# Patient Record
Sex: Female | Born: 1960 | Race: Black or African American | Hispanic: No | Marital: Single | Smoking: Current every day smoker
Health system: Southern US, Community
[De-identification: ages and names within clinical notes are randomized; demographics above are authoritative.]

## PROBLEM LIST (undated history)

## (undated) ENCOUNTER — Emergency Department (HOSPITAL_COMMUNITY): Admission: EM | Payer: Self-pay

## (undated) DIAGNOSIS — I10 Essential (primary) hypertension: Secondary | ICD-10-CM

## (undated) DIAGNOSIS — F1910 Other psychoactive substance abuse, uncomplicated: Secondary | ICD-10-CM

## (undated) DIAGNOSIS — F319 Bipolar disorder, unspecified: Secondary | ICD-10-CM

## (undated) DIAGNOSIS — F329 Major depressive disorder, single episode, unspecified: Secondary | ICD-10-CM

## (undated) DIAGNOSIS — I517 Cardiomegaly: Secondary | ICD-10-CM

## (undated) DIAGNOSIS — F99 Mental disorder, not otherwise specified: Secondary | ICD-10-CM

## (undated) DIAGNOSIS — F32A Depression, unspecified: Secondary | ICD-10-CM

## (undated) DIAGNOSIS — D219 Benign neoplasm of connective and other soft tissue, unspecified: Secondary | ICD-10-CM

## (undated) HISTORY — PX: EXPLORATORY LAPAROTOMY: SUR591

## (undated) HISTORY — PX: DILATION AND CURETTAGE OF UTERUS: SHX78

## (undated) HISTORY — PX: BILATERAL SALPINGOOPHORECTOMY: SHX1223

## (undated) HISTORY — DX: Cardiomegaly: I51.7

## (undated) HISTORY — DX: Other psychoactive substance abuse, uncomplicated: F19.10

## (undated) HISTORY — DX: Benign neoplasm of connective and other soft tissue, unspecified: D21.9

## (undated) HISTORY — PX: ABDOMINAL HYSTERECTOMY: SHX81

## (undated) HISTORY — PX: TONSILLECTOMY: SUR1361

---

## 2001-03-20 ENCOUNTER — Emergency Department (HOSPITAL_COMMUNITY): Admission: EM | Admit: 2001-03-20 | Discharge: 2001-03-20 | Payer: Self-pay | Admitting: Emergency Medicine

## 2001-03-22 ENCOUNTER — Encounter: Admission: RE | Admit: 2001-03-22 | Discharge: 2001-03-22 | Payer: Self-pay | Admitting: Internal Medicine

## 2001-03-24 ENCOUNTER — Encounter: Payer: Self-pay | Admitting: Internal Medicine

## 2001-03-24 ENCOUNTER — Encounter: Admission: RE | Admit: 2001-03-24 | Discharge: 2001-03-24 | Payer: Self-pay | Admitting: Internal Medicine

## 2002-02-15 ENCOUNTER — Emergency Department (HOSPITAL_COMMUNITY): Admission: EM | Admit: 2002-02-15 | Discharge: 2002-02-15 | Payer: Self-pay | Admitting: *Deleted

## 2002-02-18 ENCOUNTER — Emergency Department (HOSPITAL_COMMUNITY): Admission: EM | Admit: 2002-02-18 | Discharge: 2002-02-18 | Payer: Self-pay | Admitting: Emergency Medicine

## 2002-02-28 ENCOUNTER — Encounter: Admission: RE | Admit: 2002-02-28 | Discharge: 2002-02-28 | Payer: Self-pay | Admitting: Internal Medicine

## 2002-03-08 ENCOUNTER — Encounter: Admission: RE | Admit: 2002-03-08 | Discharge: 2002-03-08 | Payer: Self-pay | Admitting: *Deleted

## 2002-03-22 ENCOUNTER — Ambulatory Visit (HOSPITAL_COMMUNITY): Admission: RE | Admit: 2002-03-22 | Discharge: 2002-03-22 | Payer: Self-pay | Admitting: *Deleted

## 2002-04-01 ENCOUNTER — Encounter: Admission: RE | Admit: 2002-04-01 | Discharge: 2002-04-01 | Payer: Self-pay | Admitting: *Deleted

## 2002-04-05 ENCOUNTER — Other Ambulatory Visit: Admission: RE | Admit: 2002-04-05 | Discharge: 2002-04-05 | Payer: Self-pay | Admitting: *Deleted

## 2002-04-05 ENCOUNTER — Encounter: Admission: RE | Admit: 2002-04-05 | Discharge: 2002-04-05 | Payer: Self-pay | Admitting: *Deleted

## 2002-04-05 ENCOUNTER — Encounter (INDEPENDENT_AMBULATORY_CARE_PROVIDER_SITE_OTHER): Payer: Self-pay | Admitting: Specialist

## 2002-04-13 ENCOUNTER — Inpatient Hospital Stay (HOSPITAL_COMMUNITY): Admission: RE | Admit: 2002-04-13 | Discharge: 2002-04-16 | Payer: Self-pay | Admitting: *Deleted

## 2002-04-13 ENCOUNTER — Encounter (INDEPENDENT_AMBULATORY_CARE_PROVIDER_SITE_OTHER): Payer: Self-pay

## 2002-10-27 ENCOUNTER — Encounter: Admission: RE | Admit: 2002-10-27 | Discharge: 2002-10-27 | Payer: Self-pay | Admitting: Obstetrics and Gynecology

## 2003-01-10 ENCOUNTER — Emergency Department (HOSPITAL_COMMUNITY): Admission: EM | Admit: 2003-01-10 | Discharge: 2003-01-10 | Payer: Self-pay

## 2003-09-12 ENCOUNTER — Emergency Department (HOSPITAL_COMMUNITY): Admission: EM | Admit: 2003-09-12 | Discharge: 2003-09-12 | Payer: Self-pay | Admitting: Family Medicine

## 2005-03-08 ENCOUNTER — Emergency Department (HOSPITAL_COMMUNITY): Admission: AC | Admit: 2005-03-08 | Discharge: 2005-03-08 | Payer: Self-pay

## 2005-07-20 ENCOUNTER — Emergency Department (HOSPITAL_COMMUNITY): Admission: EM | Admit: 2005-07-20 | Discharge: 2005-07-20 | Payer: Self-pay | Admitting: Emergency Medicine

## 2005-07-23 ENCOUNTER — Emergency Department (HOSPITAL_COMMUNITY): Admission: EM | Admit: 2005-07-23 | Discharge: 2005-07-23 | Payer: Self-pay | Admitting: Emergency Medicine

## 2006-03-24 ENCOUNTER — Emergency Department (HOSPITAL_COMMUNITY): Admission: EM | Admit: 2006-03-24 | Discharge: 2006-03-24 | Payer: Self-pay | Admitting: Emergency Medicine

## 2006-03-27 ENCOUNTER — Ambulatory Visit: Payer: Self-pay | Admitting: *Deleted

## 2006-11-06 ENCOUNTER — Inpatient Hospital Stay (HOSPITAL_COMMUNITY): Admission: EM | Admit: 2006-11-06 | Discharge: 2006-11-07 | Payer: Self-pay | Admitting: Emergency Medicine

## 2006-11-06 ENCOUNTER — Ambulatory Visit: Payer: Self-pay | Admitting: Cardiology

## 2006-11-06 ENCOUNTER — Encounter: Payer: Self-pay | Admitting: Internal Medicine

## 2006-11-06 ENCOUNTER — Encounter: Payer: Self-pay | Admitting: Cardiology

## 2007-02-18 ENCOUNTER — Encounter: Payer: Self-pay | Admitting: Internal Medicine

## 2007-11-10 ENCOUNTER — Emergency Department (HOSPITAL_COMMUNITY): Admission: EM | Admit: 2007-11-10 | Discharge: 2007-11-10 | Payer: Self-pay | Admitting: Emergency Medicine

## 2008-07-04 ENCOUNTER — Emergency Department (HOSPITAL_COMMUNITY): Admission: EM | Admit: 2008-07-04 | Discharge: 2008-07-04 | Payer: Self-pay | Admitting: Emergency Medicine

## 2008-08-15 ENCOUNTER — Ambulatory Visit (HOSPITAL_COMMUNITY): Admission: RE | Admit: 2008-08-15 | Discharge: 2008-08-15 | Payer: Self-pay | Admitting: Obstetrics & Gynecology

## 2008-09-17 ENCOUNTER — Emergency Department (HOSPITAL_COMMUNITY): Admission: EM | Admit: 2008-09-17 | Discharge: 2008-09-17 | Payer: Self-pay | Admitting: Emergency Medicine

## 2008-10-12 ENCOUNTER — Emergency Department (HOSPITAL_COMMUNITY): Admission: EM | Admit: 2008-10-12 | Discharge: 2008-10-12 | Payer: Self-pay | Admitting: Emergency Medicine

## 2008-11-28 ENCOUNTER — Emergency Department (HOSPITAL_COMMUNITY): Admission: EM | Admit: 2008-11-28 | Discharge: 2008-11-28 | Payer: Self-pay | Admitting: Emergency Medicine

## 2008-11-30 ENCOUNTER — Ambulatory Visit: Payer: Self-pay | Admitting: Obstetrics and Gynecology

## 2008-11-30 ENCOUNTER — Ambulatory Visit (HOSPITAL_COMMUNITY): Admission: RE | Admit: 2008-11-30 | Discharge: 2008-11-30 | Payer: Self-pay | Admitting: Obstetrics & Gynecology

## 2009-01-01 ENCOUNTER — Encounter: Admission: RE | Admit: 2009-01-01 | Discharge: 2009-01-01 | Payer: Self-pay | Admitting: Internal Medicine

## 2009-02-06 ENCOUNTER — Ambulatory Visit: Payer: Self-pay | Admitting: Internal Medicine

## 2009-02-06 ENCOUNTER — Encounter: Payer: Self-pay | Admitting: Licensed Clinical Social Worker

## 2009-02-06 ENCOUNTER — Encounter: Payer: Self-pay | Admitting: Internal Medicine

## 2009-02-06 ENCOUNTER — Ambulatory Visit (HOSPITAL_COMMUNITY): Admission: RE | Admit: 2009-02-06 | Discharge: 2009-02-06 | Payer: Self-pay | Admitting: Internal Medicine

## 2009-02-06 DIAGNOSIS — D1720 Benign lipomatous neoplasm of skin and subcutaneous tissue of unspecified limb: Secondary | ICD-10-CM

## 2009-02-06 DIAGNOSIS — M25519 Pain in unspecified shoulder: Secondary | ICD-10-CM

## 2009-02-06 DIAGNOSIS — G4700 Insomnia, unspecified: Secondary | ICD-10-CM

## 2009-02-06 DIAGNOSIS — D259 Leiomyoma of uterus, unspecified: Secondary | ICD-10-CM | POA: Insufficient documentation

## 2009-02-06 DIAGNOSIS — F1910 Other psychoactive substance abuse, uncomplicated: Secondary | ICD-10-CM | POA: Insufficient documentation

## 2009-02-06 DIAGNOSIS — R1900 Intra-abdominal and pelvic swelling, mass and lump, unspecified site: Secondary | ICD-10-CM

## 2009-02-06 DIAGNOSIS — F1010 Alcohol abuse, uncomplicated: Secondary | ICD-10-CM | POA: Insufficient documentation

## 2009-02-06 DIAGNOSIS — I10 Essential (primary) hypertension: Secondary | ICD-10-CM | POA: Insufficient documentation

## 2009-02-06 LAB — CONVERTED CEMR LAB
ALT: 18 units/L (ref 0–35)
BUN: 11 mg/dL (ref 6–23)
CO2: 22 meq/L (ref 19–32)
Cholesterol: 177 mg/dL (ref 0–200)
Creatinine, Ser: 0.89 mg/dL (ref 0.40–1.20)
Eosinophils Absolute: 0.1 10*3/uL (ref 0.0–0.7)
Eosinophils Relative: 2 % (ref 0–5)
Glucose, Bld: 72 mg/dL (ref 70–99)
HCT: 43 % (ref 36.0–46.0)
HDL: 45 mg/dL (ref >39)
Hgb A1c MFr Bld: 5.8 %
Lymphocytes Relative: 34 % (ref 12–46)
Lymphs Abs: 2 10*3/uL (ref 0.7–4.0)
MCV: 88.9 fL (ref 78.0–100.0)
Monocytes Relative: 6 % (ref 3–12)
RBC: 4.84 M/uL (ref 3.87–5.11)
Total Bilirubin: 0.3 mg/dL (ref 0.3–1.2)
Total CHOL/HDL Ratio: 3.9
Triglycerides: 165 mg/dL — ABNORMAL HIGH (ref <150)
VLDL: 33 mg/dL (ref 0–40)
WBC: 5.9 10*3/uL (ref 4.0–10.5)

## 2009-02-08 ENCOUNTER — Emergency Department (HOSPITAL_COMMUNITY): Admission: EM | Admit: 2009-02-08 | Discharge: 2009-02-08 | Payer: Self-pay | Admitting: Emergency Medicine

## 2009-02-15 ENCOUNTER — Encounter: Admission: RE | Admit: 2009-02-15 | Discharge: 2009-02-15 | Payer: Self-pay | Admitting: Infectious Diseases

## 2009-08-16 ENCOUNTER — Ambulatory Visit: Payer: Self-pay | Admitting: Family Medicine

## 2009-08-17 ENCOUNTER — Ambulatory Visit (HOSPITAL_COMMUNITY): Admission: RE | Admit: 2009-08-17 | Discharge: 2009-08-17 | Payer: Self-pay | Admitting: Obstetrics & Gynecology

## 2009-08-17 ENCOUNTER — Encounter: Payer: Self-pay | Admitting: Family Medicine

## 2009-08-17 LAB — CONVERTED CEMR LAB
Hemoglobin: 14.7 g/dL (ref 12.0–15.0)
MCHC: 33 g/dL (ref 30.0–36.0)
MCV: 88 fL (ref 78.0–100.0)
RBC: 5.07 M/uL (ref 3.87–5.11)
WBC: 5.7 10*3/uL (ref 4.0–10.5)

## 2009-10-04 ENCOUNTER — Emergency Department (HOSPITAL_COMMUNITY): Admission: EM | Admit: 2009-10-04 | Discharge: 2009-10-04 | Payer: Self-pay | Admitting: Emergency Medicine

## 2009-12-06 ENCOUNTER — Encounter: Payer: Self-pay | Admitting: Licensed Clinical Social Worker

## 2009-12-06 ENCOUNTER — Ambulatory Visit: Payer: Self-pay | Admitting: Internal Medicine

## 2009-12-06 DIAGNOSIS — K029 Dental caries, unspecified: Secondary | ICD-10-CM

## 2009-12-06 DIAGNOSIS — Z9189 Other specified personal risk factors, not elsewhere classified: Secondary | ICD-10-CM

## 2009-12-06 DIAGNOSIS — R109 Unspecified abdominal pain: Secondary | ICD-10-CM

## 2009-12-06 LAB — CONVERTED CEMR LAB
ALT: 18 units/L (ref 0–35)
Amphetamine Screen, Ur: NEGATIVE
BUN: 12 mg/dL (ref 6–23)
Barbiturate Quant, Ur: NEGATIVE
Basophils Relative: 0 % (ref 0–1)
Benzodiazepines.: NEGATIVE
CO2: 24 meq/L (ref 19–32)
Calcium: 9.9 mg/dL (ref 8.4–10.5)
Chloride: 106 meq/L (ref 96–112)
Cocaine Metabolites: POSITIVE — AB
Creatinine, Ser: 0.93 mg/dL (ref 0.40–1.20)
Creatinine,U: 111.8 mg/dL
Eosinophils Absolute: 0.1 10*3/uL (ref 0.0–0.7)
Eosinophils Relative: 2 % (ref 0–5)
Glucose, Bld: 85 mg/dL (ref 70–99)
HCT: 44.4 % (ref 36.0–46.0)
Hemoglobin, Urine: NEGATIVE
Leukocytes, UA: NEGATIVE
Lymphs Abs: 1.8 10*3/uL (ref 0.7–4.0)
MCHC: 33.1 g/dL (ref 30.0–36.0)
MCV: 88.6 fL (ref >=78.0)
Monocytes Relative: 7 % (ref 3–12)
Neutrophils Relative %: 58 % (ref 43–77)
Nitrite: NEGATIVE
Phencyclidine (PCP): NEGATIVE
RBC: 5.01 M/uL (ref 3.87–5.11)
Specific Gravity, Urine: 1.017 (ref 1.005–1.0)
Total Bilirubin: 0.4 mg/dL (ref 0.3–1.2)
Urine Glucose: NEGATIVE mg/dL
pH: 5.5 (ref 5.0–8.0)

## 2009-12-11 ENCOUNTER — Telehealth: Payer: Self-pay | Admitting: Licensed Clinical Social Worker

## 2009-12-15 ENCOUNTER — Inpatient Hospital Stay (HOSPITAL_COMMUNITY): Admission: AD | Admit: 2009-12-15 | Discharge: 2009-12-18 | Payer: Self-pay | Admitting: Obstetrics & Gynecology

## 2009-12-15 ENCOUNTER — Ambulatory Visit: Payer: Self-pay | Admitting: Obstetrics & Gynecology

## 2009-12-15 ENCOUNTER — Encounter: Payer: Self-pay | Admitting: Emergency Medicine

## 2009-12-18 ENCOUNTER — Encounter: Payer: Self-pay | Admitting: Obstetrics & Gynecology

## 2009-12-20 ENCOUNTER — Ambulatory Visit: Payer: Self-pay | Admitting: Obstetrics & Gynecology

## 2010-01-01 ENCOUNTER — Inpatient Hospital Stay (HOSPITAL_COMMUNITY): Admission: RE | Admit: 2010-01-01 | Discharge: 2010-01-03 | Payer: Self-pay | Admitting: Obstetrics & Gynecology

## 2010-01-01 ENCOUNTER — Encounter: Payer: Self-pay | Admitting: Obstetrics & Gynecology

## 2010-01-01 ENCOUNTER — Ambulatory Visit: Payer: Self-pay | Admitting: Obstetrics & Gynecology

## 2010-01-09 ENCOUNTER — Encounter: Payer: Self-pay | Admitting: Licensed Clinical Social Worker

## 2010-01-10 ENCOUNTER — Ambulatory Visit: Payer: Self-pay | Admitting: Obstetrics and Gynecology

## 2010-02-08 ENCOUNTER — Telehealth: Payer: Self-pay | Admitting: Licensed Clinical Social Worker

## 2010-02-14 ENCOUNTER — Ambulatory Visit: Payer: Self-pay | Admitting: Obstetrics and Gynecology

## 2010-02-15 ENCOUNTER — Telehealth: Payer: Self-pay | Admitting: Internal Medicine

## 2010-04-06 ENCOUNTER — Emergency Department (HOSPITAL_COMMUNITY): Admission: EM | Admit: 2010-04-06 | Discharge: 2010-04-06 | Payer: Self-pay | Admitting: Emergency Medicine

## 2010-05-03 ENCOUNTER — Ambulatory Visit: Payer: Self-pay | Admitting: Internal Medicine

## 2010-05-03 ENCOUNTER — Ambulatory Visit (HOSPITAL_COMMUNITY): Admission: RE | Admit: 2010-05-03 | Discharge: 2010-05-03 | Payer: Self-pay | Admitting: Family Medicine

## 2010-05-03 ENCOUNTER — Encounter: Payer: Self-pay | Admitting: Internal Medicine

## 2010-05-03 DIAGNOSIS — M7130 Other bursal cyst, unspecified site: Secondary | ICD-10-CM | POA: Insufficient documentation

## 2010-05-03 LAB — CONVERTED CEMR LAB
Eosinophils Relative: 1 % (ref 0–5)
HCT: 45.8 % (ref 36.0–46.0)
Lymphocytes Relative: 47 % — ABNORMAL HIGH (ref 12–46)
Lymphs Abs: 2.2 10*3/uL (ref 0.7–4.0)
MCV: 86.4 fL (ref >=78.0)
Monocytes Relative: 7 % (ref 3–12)
Neutrophils Relative %: 45 % (ref 43–77)
Platelets: 237 10*3/uL (ref 150–400)
RBC: 5.3 M/uL — ABNORMAL HIGH (ref 3.87–5.11)
Sed Rate: 12 mm/hr (ref 0–22)
TSH: 2.415 microintl units/mL (ref 0.350–4.5)
WBC: 4.7 10*3/uL (ref 4.0–10.5)

## 2010-05-03 LAB — HM MAMMOGRAPHY

## 2010-05-06 ENCOUNTER — Encounter: Payer: Self-pay | Admitting: Licensed Clinical Social Worker

## 2010-05-06 ENCOUNTER — Encounter: Payer: Self-pay | Admitting: Internal Medicine

## 2010-05-06 ENCOUNTER — Ambulatory Visit: Payer: Self-pay | Admitting: Internal Medicine

## 2010-05-06 DIAGNOSIS — L723 Sebaceous cyst: Secondary | ICD-10-CM | POA: Insufficient documentation

## 2010-05-07 ENCOUNTER — Encounter: Payer: Self-pay | Admitting: Licensed Clinical Social Worker

## 2010-06-27 ENCOUNTER — Emergency Department (HOSPITAL_COMMUNITY): Admission: EM | Admit: 2010-06-27 | Discharge: 2010-06-27 | Payer: Self-pay | Admitting: Emergency Medicine

## 2010-07-08 ENCOUNTER — Encounter: Payer: Self-pay | Admitting: Licensed Clinical Social Worker

## 2010-07-08 ENCOUNTER — Ambulatory Visit: Payer: Self-pay | Admitting: Internal Medicine

## 2010-07-12 ENCOUNTER — Ambulatory Visit: Payer: Self-pay | Admitting: Internal Medicine

## 2010-07-12 ENCOUNTER — Telehealth: Payer: Self-pay | Admitting: Internal Medicine

## 2010-07-12 LAB — CONVERTED CEMR LAB
Chloride: 106 meq/L (ref 96–112)
Potassium: 4.1 meq/L (ref 3.5–5.3)
Sodium: 141 meq/L (ref 135–145)

## 2010-07-26 ENCOUNTER — Ambulatory Visit: Payer: Self-pay | Admitting: Internal Medicine

## 2010-08-13 ENCOUNTER — Ambulatory Visit: Admit: 2010-08-13 | Payer: Self-pay | Admitting: Family Medicine

## 2010-08-22 ENCOUNTER — Ambulatory Visit: Admit: 2010-08-22 | Payer: Self-pay

## 2010-08-25 ENCOUNTER — Emergency Department (HOSPITAL_COMMUNITY)
Admission: EM | Admit: 2010-08-25 | Discharge: 2010-08-25 | Payer: Self-pay | Source: Home / Self Care | Admitting: Emergency Medicine

## 2010-08-27 ENCOUNTER — Ambulatory Visit
Admission: RE | Admit: 2010-08-27 | Discharge: 2010-08-27 | Payer: Self-pay | Source: Home / Self Care | Attending: Internal Medicine | Admitting: Internal Medicine

## 2010-08-27 DIAGNOSIS — H00019 Hordeolum externum unspecified eye, unspecified eyelid: Secondary | ICD-10-CM | POA: Insufficient documentation

## 2010-08-28 LAB — CONVERTED CEMR LAB: HIV: NONREACTIVE

## 2010-09-01 ENCOUNTER — Encounter: Payer: Self-pay | Admitting: *Deleted

## 2010-09-01 ENCOUNTER — Encounter: Payer: Self-pay | Admitting: Internal Medicine

## 2010-09-01 ENCOUNTER — Encounter: Payer: Self-pay | Admitting: General Surgery

## 2010-09-02 ENCOUNTER — Encounter: Payer: Self-pay | Admitting: General Surgery

## 2010-09-02 ENCOUNTER — Encounter: Payer: Self-pay | Admitting: Family Medicine

## 2010-09-03 ENCOUNTER — Telehealth: Payer: Self-pay | Admitting: Internal Medicine

## 2010-09-04 ENCOUNTER — Telehealth: Payer: Self-pay | Admitting: *Deleted

## 2010-09-10 NOTE — Assessment & Plan Note (Signed)
Summary: Soc. Work  Met with patient in exam room.  She has just had surgery for growths in abdomen.  She has staples in her abdominal area that may be taken out today by specialist.  The patient is homeless and living in a shed provided by local church member.  She has explored housing and the housing coalition does not have any housing for disabled currently.  Other than the shelter there is nothing else I could offer her today.  She tells me she is happy for any place to stay and she has a cot to sleep on.   She had to cancel Fam. Services and [facility] appmts due to surgery but plans to get back on track.   The patient is asking for food to help her until June 10th when she gets her foodstamps.  Soc. Work provided $30 in [employer] and a small bag of snacks for her to take with her.  She was unable to carry anything heavier due to abdominal surgery wound.   Soc. Work encouraged her to connect with resources once she is well enough to do so and check in with Social Work periodically.

## 2010-09-10 NOTE — Progress Notes (Signed)
Summary: refill/gg  Phone Note Refill Request  on February 15, 2010 12:02 PM  Refills Requested: Medication #1:  HYDROCHLOROTHIAZIDE 25 MG TABS Take one (1) by mouth daily. [facility] does not have HCTZ in stock, can you give her one month at [facility] ? she also request something to help her sleep.    Pt # [id number]   Method Requested: Fax to Local Pharmacy Initial call taken by: Merrie Roof RN,  February 15, 2010 12:04 PM  Follow-up for Phone Call        Pt needs w/u for Obstructive Sleep Apnea. I will give small dose ambien but if it does not work, dose should not be increased until further work up is obtained.  Follow-up by: Clerance Lav MD,  February 15, 2010 8:00 PM    New/Updated Medications: AMBIEN 5 MG TABS (ZOLPIDEM TARTRATE) at bedtime one tablet only Prescriptions: AMBIEN 5 MG TABS (ZOLPIDEM TARTRATE) at bedtime one tablet only  #30 x 0   Entered and Authorized by:   Clerance Lav MD   Signed by:   Clerance Lav MD on 02/15/2010   Method used:   Telephoned to ...       [facility]       [address]       [place], [place]  [address]       Ph: [phone]       Fax: [phone]   RxID:   [id number] HYDROCHLOROTHIAZIDE 25 MG TABS (HYDROCHLOROTHIAZIDE) Take one (1) by mouth daily  #31 x 0   Entered and Authorized by:   Clerance Lav MD   Signed by:   Clerance Lav MD on 02/15/2010   Method used:   Electronically to        [facility]       [address]       [place], [place]  [address]       Ph: [phone]       Fax: [phone]   RxID:   [id number]   Appended Document: refill/gg meds called in and pt informed

## 2010-09-10 NOTE — Miscellaneous (Signed)
Summary: Orders Update  Clinical Lists Changes  Orders: Added new Referral order of Social Work Referral (Social ) - Signed 

## 2010-09-10 NOTE — Assessment & Plan Note (Signed)
Summary: CHECKUP/SB.   Vital Signs:  Patient profile:   50 year old female Height:      65 inches (165.10 cm) Weight:      213.3 pounds (10.59 kg) BMI:     3.89 Temp:     97.3 degrees F (36.28 degrees C) oral Pulse rate:   73 / minute BP sitting:   142 / 98  (left arm) Cuff size:   large  Vitals Entered By: Theotis Barrio NT II (December 06, 2009 9:29 AM) CC: PATIENT IS HAVING RIGHT SIDE PAIN FOR ABOUT A WEEK -PAIN LEVEL #10 /  NEED RESULTS OF R-SHOULDER X-RAY/ SEE DONNA T.,  Is Patient Diabetic? No Pain Assessment Patient in pain? yes     Location: RIGHT SIDE Intensity:         10 Type: ALL Onset of pain  FOR ABOUT A WEEK Nutritional Status BMI of > 30 = obese  Have you ever been in a relationship where you felt threatened, hurt or afraid?No   Does patient need assistance? Functional Status Self care Ambulation Normal Comments PATIENT IS HAVING RIGHT SIDE PAIN FOR ABOUT A WEEK / SEE DONNA T.  / EYE GETTING DARKER / DENTAL REFERRAL   Primary Care Provider:  Clerance Lav MD  CC:  PATIENT IS HAVING RIGHT SIDE PAIN FOR ABOUT A WEEK -PAIN LEVEL #10 /  NEED RESULTS OF R-SHOULDER X-RAY/ SEE DONNA T. and .  History of Present Illness: Pt is a 50 yo AAF with PMH of HTN, depression, PSA who came here for right abdominal pain for 1 week. The pain started a week ago w/o any triggering events, denies trauma. It is constant, sharp pain, 10/10, no radiation, not associate with N/V, food does not make it worse, but movement makes it worse. She has diarrhea for 1 week, which is one day before her abdominal pain. She denies any fever, chills. She also has depression and worsening in the past 6 months, denies SI/HI for now, has poor sleep. Current smoker about 1/2 PPD, admitted drug abuse with cocaine a month ago. No dysuria.     Depression History:      The patient is having a depressed mood most of the day and has a diminished interest in her usual daily activities.         Preventive  Screening-Counseling & Management  Alcohol-Tobacco     Smoking Status: current     Smoking Cessation Counseling: yes     Packs/Day: 0.5  Caffeine-Diet-Exercise     Does Patient Exercise: no  Problems Prior to Update: 1)  Depression, Mild  (ICD-311) 2)  Insomnia  (ICD-780.52) 3)  Inadequate Material Resources  (ICD-V60.2) 4)  Pain in Joint, Shoulder Region  (ICD-719.41) 5)  Lipoma, Skin  (ICD-214.1) 6)  Drug Abuse  (ICD-305.90) 7)  Essential Hypertension, Benign  (ICD-401.1) 8)  Fibroids, Uterus  (ICD-218.9) 9)  Alcohol Abuse  (ICD-305.00) 10)  Pelvic Mass  (ICD-789.30)  Medications Prior to Update: 1)  One Daily  Tabs (Multiple Vitamin) .... Take 1 Tablet By Mouth Once A Day 2)  Folic Acid 1 Mg Tabs (Folic Acid) .... Take 1 Tablet By Mouth Once A Day  Current Medications (verified): 1)  One Daily  Tabs (Multiple Vitamin) .... Take 1 Tablet By Mouth Once A Day 2)  Folic Acid 1 Mg Tabs (Folic Acid) .... Take 1 Tablet By Mouth Once A Day  Allergies (verified): 1)  ! Ibuprofen  Past History:  Past Medical History: Last  updated: 02/06/2009 Fibroids- now hysterectomy Pelvic mass- fluid collection? being treated by gynecologist, sugrical intervention planned 07/10. Alcoholism-15 years going through AA not completely sober yet. Smoking- 15 years IV drug use- cocaine on occassions- says she is fighting all sort of addictions.   Past Surgical History: Last updated: 02/06/2009 Hysterectomy- 2003  Family History: Last updated: 02/06/2009 Mom-HTN,  Dad- does not know him Sister- HTN Brother- healthy  Social History: Last updated: 02/06/2009 She is inbetween places She has been in shelter and does not like the place She does not have resources.  Single No kids Smoking 1/2 pack, alcohol occassionally, drug- cocaine occassionaly.   Risk Factors: Exercise: no (12/06/2009)  Risk Factors: Smoking Status: current (12/06/2009) Packs/Day: 0.5 (12/06/2009)  Family  History: Reviewed history from 02/06/2009 and no changes required. Mom-HTN,  Dad- does not know him Sister- HTN Brother- healthy  Social History: Reviewed history from 02/06/2009 and no changes required. She is inbetween places She has been in shelter and does not like the place She does not have resources.  Single No kids Smoking 1/2 pack, alcohol occassionally, drug- cocaine occassionaly. Does Patient Exercise:  no  Review of Systems       The patient complains of abdominal pain.  The patient denies fever, weight loss, chest pain, syncope, dyspnea on exertion, peripheral edema, prolonged cough, hemoptysis, melena, and hematochezia.    Physical Exam  General:  alert, well-developed, well-nourished, well-hydrated, and overweight-appearing.   Head:  no abnormalities observed.   Nose:  no nasal discharge.   Mouth:  pharynx pink and moist.   Neck:  supple.   Lungs:  normal respiratory effort, normal breath sounds, no crackles, and no wheezes.   Heart:  normal rate, regular rhythm, no murmur, no gallop, and no JVD.   Abdomen:  soft, no masses, and no rebound tenderness.  Right abdominal tenderness, CVAT negative, Murphy's sign positive. Msk:  normal ROM, no joint tenderness, no joint swelling, no joint warmth, and no redness over joints.   Pulses:  2+ Extremities:  No edema. Neurologic:  alert & oriented X3, cranial nerves II-XII intact, strength normal in all extremities, sensation intact to light touch, gait normal, and DTRs symmetrical and normal.     Impression & Recommendations:  Problem # 1:  ABDOMINAL PAIN (ICD-789.00) Assessment New  She has abdominal pain for a week, only movement makes it worse. DD is wide including cholecystitis or gallstone, musculoskeletal, diverticulitis,  GERD, PUD, or urinary stone, UTI. Anxiety may also play a role. Other causes such as pancreatitis is less likely. After discussion with Dr. Josem Kaufmann, will check blood work to rule out gallbladder  disorder. Checl UA to rule out urinary stone and UTI. Also give her tylenol, PPI to controll her symptoms.   T-Comprehensive Metabolic Panel [phone]) T-CBC w/Diff [phone]) T-Urinalysis ([id number]) Ultrasound (Ultrasound)   Discussed symptom control with the patient.   Problem # 2:  DRUG ABUSE, HX OF (ICD-V15.89) Assessment: Unchanged She has Hx of drug abuse and recent use of cocaine. Will check UDS. Provided smoking cessation conselling, does not want to quit for now.  Orders: T-Drug Screen-Urine, (single) [phone])  Problem # 3:  DEPRESSION, MILD (ICD-311) Assessment: Deteriorated  She has worsening depression, Hx of SI, but now denies SI/HI, will give her prozac to see response. Her updated medication list for this problem includes:    Fluoxetine Hcl 20 Mg Tabs (Fluoxetine hcl) .[name]... Take 1 tablet by mouth once a day  Problem # 4:  ESSENTIAL HYPERTENSION, BENIGN (  ICD-401.1) Assessment: Deteriorated She has wosening BP, may be due to her abdominal pain. but she said her BP usually runs 140s/100s and has taken BP meds before, will start HCTZ for now and recheck her BP at next visit. Her updated medication list for this problem includes:    Hydrochlorothiazide 25 Mg Tabs (Hydrochlorothiazide) .[name]... Take one (1) by mouth daily  BP today: 142/98 Prior BP: 129/89 (02/06/2009)  Labs Reviewed: K+: 4.2 (02/06/2009) Creat: : 0.89 (02/06/2009)   Chol: 177 (02/06/2009)   HDL: 45 (02/06/2009)   LDL: 99 (02/06/2009)   TG: 165 (02/06/2009)  Complete Medication List: 1)  One Daily Tabs (Multiple vitamin) .... Take 1 tablet by mouth once a day 2)  Folic Acid 1 Mg Tabs (Folic acid) .... Take 1 tablet by mouth once a day 3)  Fluoxetine Hcl 20 Mg Tabs (Fluoxetine hcl) .... Take 1 tablet by mouth once a day 4)  [facility] Omeprazole 20 Mg Tbec (Omeprazole) .... Take 2 tablets by mouth once a day 5)  Tylenol Extra Strength 500 Mg Tabs (Acetaminophen) .... Take 2 tablets by mouth every  8 hours as needed for pain 6)  Hydrochlorothiazide 25 Mg Tabs (Hydrochlorothiazide) .... Take one (1) by mouth daily  Patient Instructions: 1)  Please schedule a follow-up appointment in 2 weeks. 2)  We will call you for your lab results if any abnormal. 3)  Please come to the Clinic or Ed if your symptoms become worse.  4)  Tobacco is very bad for your health and your loved ones! You Should stop smoking!. 5)  Stop Smoking Tips: Choose a Quit date. Cut down before the Quit date. decide what you will do as a substitute when you feel the urge to smoke(gum,toothpick,exercise). 6)  You need to lose weight. Consider a lower calorie diet and regular exercise.  Prescriptions: HYDROCHLOROTHIAZIDE 25 MG TABS (HYDROCHLOROTHIAZIDE) Take one (1) by mouth daily  #31 x 3   Entered and Authorized by:   Jackson Latino MD   Signed by:   Jackson Latino MD on 12/06/2009   Method used:   Print then Give to Patient   RxID:   [id number] TYLENOL EXTRA STRENGTH 500 MG TABS (ACETAMINOPHEN) Take 2 tablets by mouth every 8 hours as needed for pain  #60 x 2   Entered and Authorized by:   Jackson Latino MD   Signed by:   Jackson Latino MD on 12/06/2009   Method used:   Print then Give to Patient   RxID:   [id number] [facility] OMEPRAZOLE 20 MG TBEC (OMEPRAZOLE) Take 2 tablets by mouth once a day  #62 x 3   Entered and Authorized by:   Jackson Latino MD   Signed by:   Jackson Latino MD on 12/06/2009   Method used:   Print then Give to Patient   RxID:   [id number] FLUOXETINE HCL 20 MG TABS (FLUOXETINE HCL) Take 1 tablet by mouth once a day  #31 x 3   Entered and Authorized by:   Jackson Latino MD   Signed by:   Jackson Latino MD on 12/06/2009   Method used:   Print then Give to Patient   RxID:   [id number]  Process Orders Check Orders Results:     [facility]: ABN not required for this insurance Tests Sent for requisitioning (December 06, 2009 11:00 AM):     12/06/2009:  [facility] -- T-Comprehensive Metabolic Panel [[id number]] (signed)     12/06/2009: [facility] -- [employer]  w/Diff [[id number]] (signed)     12/06/2009: [facility] -- T-Urinalysis [[id number]] (signed)     12/06/2009: [facility] -- T-Drug Screen-Urine, (single) [[id number]] (signed)    Prevention & Chronic Care Immunizations   Influenza vaccine: Not documented    Tetanus booster: 02/06/2009: Td    Pneumococcal vaccine: Not documented   Pneumococcal vaccine deferral: Deferred  (02/06/2009)  Other Screening   Pap smear: Not documented    Mammogram: Not documented   Mammogram action/deferral: Ordered  (02/06/2009)   Smoking status: current  (12/06/2009)   Smoking cessation counseling: yes  (12/06/2009)  Lipids   Total Cholesterol: 177  (02/06/2009)   Lipid panel action/deferral: Lipid Panel ordered   LDL: 99  (02/06/2009)   LDL Direct: Not documented   HDL: 45  (02/06/2009)   Triglycerides: 165  (02/06/2009)  Hypertension   Last Blood Pressure: 142 / 98  (12/06/2009)   Serum creatinine: 0.89  (02/06/2009)   Serum potassium 4.2  (02/06/2009) CMP ordered     Hypertension flowsheet reviewed?: Yes   Progress toward BP goal: Deteriorated  Self-Management Support :   Personal Goals (by the next clinic visit) :      Personal blood pressure goal: 140/90  (12/06/2009)   Patient will work on the following items until the next clinic visit to reach self-care goals:     Eating: drink diet soda or water instead of juice or soda, eat more vegetables, use fresh or frozen vegetables, limit or avoid alcohol  (12/06/2009)    Hypertension self-management support: Not documented   Process Orders Check Orders Results:     [facility]: ABN not required for this insurance Tests Sent for requisitioning (December 06, 2009 11:00 AM):     12/06/2009: [facility] -- T-Comprehensive Metabolic  Panel [[id number]] (signed)     12/06/2009: [facility] -- T-CBC w/Diff [[id number]] (signed)     12/06/2009: [facility] -- T-Urinalysis [[id number]] (signed)     12/06/2009: [facility] -- T-Drug Screen-Urine, (single) [[id number]] (signed)   Appended Document: CHECKUP/SB. Clinical Lists Changes She has teeth pain and wants to get dentist referral.  Problems: Added new problem of DENTAL PAIN (ICD-525.9) Assessed DENTAL PAIN as comment only - She has teeth pain and wants to get dentist referral. Will do this.  Orders: Dental Referral (Dentist)  Orders: Added new Referral order of Dental Referral (Dentist) - Signed       Impression & Recommendations:  Problem # 1:  DENTAL PAIN (ICD-525.9) Assessment Comment Only She has teeth pain and wants to get dentist referral. Will do this.  Orders: Dental Referral (Dentist)  Complete Medication List: 1)  One Daily Tabs (Multiple vitamin) .... Take 1 tablet by mouth once a day 2)  Folic Acid 1 Mg Tabs (Folic acid) .... Take 1 tablet by mouth once a day 3)  Fluoxetine Hcl 20 Mg Tabs (Fluoxetine hcl) .... Take 1 tablet by mouth once a day 4)  [facility] Omeprazole 20 Mg Tbec (Omeprazole) .... Take 2 tablets by mouth once a day 5)  Tylenol Extra Strength 500 Mg Tabs (Acetaminophen) .... Take 2 tablets by mouth every 8 hours as needed for pain 6)  Hydrochlorothiazide 25 Mg Tabs (Hydrochlorothiazide) .... Take one (1) by mouth daily

## 2010-09-10 NOTE — Assessment & Plan Note (Signed)
Summary: ACUTE/SHAH/SICK VISIT/CH   Vital Signs:  Patient profile:   50 year old female Height:      65 inches (165.10 cm) Weight:      224.03 pounds (101.83 kg) BMI:     37.42 Temp:     97.9 degrees F (36.61 degrees C) oral Pulse rate:   60 / minute BP sitting:   143 / 93  (right arm)  Vitals Entered By: Angelina Ok RN (July 08, 2010 10:30 AM) CC: Depression Is Patient Diabetic? No Pain Assessment Patient in pain? yes     Location: ALL OVER Intensity: 7 Type: aching Onset of pain  Constant Nutritional Status BMI of > 30 = obese  Have you ever been in a relationship where you felt threatened, hurt or afraid?No   Does patient need assistance? Functional Status Self care Ambulation Normal Comments Went to ER at [facility] for an infection after Dental work.  Needs new B/P med.  Not helping.  Wants Prozac.  Mole in head is getting larger.  Problems with her right shoulder.  Wants liver function test.   Primary Care Provider:  Clerance Lav MD  CC:  Depression.  History of Present Illness: Pt is a 50 yo F with PMHx of HTN and depression who presents to clinic for regular f/u BP and right shoulder pain with insomnia. Her BP usually good in the morning and high in the afternoon (SBP160s). She still has right shoulder pain and has not taken any pain meds. Also c/o of insomnia which she usually gets uo at 4AM and back to sleep in 9 AM. She has no other c/o, including SOB, CP or abdominal pain, dysuria. She said she has been taking all her meds and wnats refill ofprozac. Denies smoking, ETOH or drug abuse.     Depression History:      The patient denies a depressed mood most of the day and a diminished interest in her usual daily activities.         Preventive Screening-Counseling & Management  Alcohol-Tobacco     Smoking Status: current     Smoking Cessation Counseling: yes     Packs/Day: 0.5  Comments: Quit 2 days ago.  Mammogram  Procedure date:   05/03/2010  Findings:      No malignancy.  Problems Prior to Update: 1)  Sebaceous Cyst, Scalp  (ICD-706.2) 2)  Synovial Cyst  (ICD-727.40) 3)  Dental Pain  (ICD-525.9) 4)  Drug Abuse, Hx of  (ICD-V15.89) 5)  Abdominal Pain  (ICD-789.00) 6)  Depression, Mild  (ICD-311) 7)  Insomnia  (ICD-780.52) 8)  Inadequate Material Resources  (ICD-V60.2) 9)  Pain in Joint, Shoulder Region  (ICD-719.41) 10)  Lipoma, Skin  (ICD-214.1) 11)  Drug Abuse  (ICD-305.90) 12)  Essential Hypertension, Benign  (ICD-401.1) 13)  Fibroids, Uterus  (ICD-218.9) 14)  Alcohol Abuse  (ICD-305.00) 15)  Pelvic Mass  (ICD-789.30)  Medications Prior to Update: 1)  Fluoxetine Hcl 20 Mg Tabs (Fluoxetine Hcl) .... Take 1 Tablet By Mouth Once A Day 2)  Atenolol 50 Mg Tabs (Atenolol) .... Take 1 Tablet By Mouth Once A Day 3)  Hydrocodone-Acetaminophen 10-500 Mg Tabs (Hydrocodone-Acetaminophen) .... Take 1 Tablet By Mouth Every 6 Hours 4)  Trazodone Hcl 100 Mg Tabs (Trazodone Hcl) .... Take 1 Tab By Mouth At Bedtime 5)  Prenatal Vitamins 0.8 Mg Tabs (Prenatal Multivit-Min-Fe-Fa) .... Take 1 Tablet By Mouth Once A Day 6)  Celebrex 200 Mg Caps (Celecoxib) .... Take 1 Tab By Mouth  Once Daily Maximum Daily Allowed 2 Tablets  Current Medications (verified): 1)  Fluoxetine Hcl 20 Mg Tabs (Fluoxetine Hcl) .... Take 1 Tablet By Mouth Once A Day 2)  Atenolol 50 Mg Tabs (Atenolol) .... Take 1 Tablet By Mouth Once A Day 3)  Hydrocodone-Acetaminophen 10-500 Mg Tabs (Hydrocodone-Acetaminophen) .... Take 1 Tablet By Mouth Every 6 Hours 4)  Trazodone Hcl 100 Mg Tabs (Trazodone Hcl) .... Take 1 Tab By Mouth At Bedtime 5)  Prenatal Vitamins 0.8 Mg Tabs (Prenatal Multivit-Min-Fe-Fa) .... Take 1 Tablet By Mouth Once A Day 6)  Celebrex 200 Mg Caps (Celecoxib) .... Take 1 Tab By Mouth Once Daily Maximum Daily Allowed 2 Tablets  Allergies (verified): 1)  ! Ibuprofen  Past History:  Past Medical History: Last updated:  02/06/2009 Fibroids- now hysterectomy Pelvic mass- fluid collection? being treated by gynecologist, sugrical intervention planned 07/10. Alcoholism-15 years going through AA not completely sober yet. Smoking- 15 years IV drug use- cocaine on occassions- says she is fighting all sort of addictions.   Family History: Last updated: 02/06/2009 Mom-HTN,  Dad- does not know him Sister- HTN Brother- healthy  Social History: Last updated: 02/06/2009 She is inbetween places She has been in shelter and does not like the place She does not have resources.  Single No kids Smoking 1/2 pack, alcohol occassionally, drug- cocaine occassionaly.   Risk Factors: Smoking Status: current (07/08/2010) Packs/Day: 0.5 (07/08/2010)  Family History: Reviewed history from 02/06/2009 and no changes required. Mom-HTN,  Dad- does not know him Sister- HTN Brother- healthy  Social History: Reviewed history from 02/06/2009 and no changes required. She is inbetween places She has been in shelter and does not like the place She does not have resources.  Single No kids Smoking 1/2 pack, alcohol occassionally, drug- cocaine occassionaly.   Review of Systems       The patient complains of weight gain.  The patient denies fever, hoarseness, chest pain, syncope, dyspnea on exertion, peripheral edema, prolonged cough, headaches, hemoptysis, abdominal pain, melena, and hematochezia.    Physical Exam  General:  alert, well-developed, well-nourished, well-hydrated, and overweight-appearing.   Nose:  no nasal discharge.   Mouth:  pharynx pink and moist.   Neck:  supple.   Lungs:  normal respiratory effort, normal breath sounds, no crackles, and no wheezes.   Heart:  normal rate, regular rhythm, no murmur, and no JVD.   Abdomen:  soft, non-tender, normal bowel sounds, and no distention.   Msk:  normal ROM, no joint tenderness, no joint swelling, and no joint warmth.   Pulses:  2+ Extremities:  No edema.   Neurologic:  alert & oriented X3, cranial nerves II-XII intact, and gait normal.     Impression & Recommendations:  Problem # 1:  ESSENTIAL HYPERTENSION, BENIGN (ICD-401.1) Assessment Deteriorated Her BP still not well controlled and will add HCTZ as her HR 60 and will not increase atenolol. Also asked her weight loss and exercise and will recheck BP at next visit.   Her updated medication list for this problem includes:    Atenolol 50 Mg Tabs (Atenolol) .[name]... Take 1 tablet by mouth once a day    Hydrochlorothiazide 25 Mg Tabs (Hydrochlorothiazide) .[name]... Take one (1) by mouth daily  BP today: 143/93 Prior BP: 133/89 (05/06/2010)  Labs Reviewed: K+: 4.3 (12/06/2009) Creat: : 0.93 (12/06/2009)   Chol: 177 (02/06/2009)   HDL: 45 (02/06/2009)   LDL: 99 (02/06/2009)   TG: 165 (02/06/2009)  Problem # 2:  PAIN IN JOINT,  SHOULDER REGION (ICD-719.41) Assessment: Unchanged She still has right shoulder pain and has not taken her pain meds. Asked her to get her meds and can use heating/icy pad, asked CSW to help for her financial needs.  Her updated medication list for this problem includes:    Hydrocodone-acetaminophen 10-500 Mg Tabs (Hydrocodone-acetaminophen) .[name]... Take 1 tablet by mouth every 6 hours    Celebrex 200 Mg Caps (Celecoxib) .[name]... Take 1 tab by mouth once daily maximum daily allowed 2 tablets  Problem # 3:  DEPRESSION, MILD (ICD-311) Assessment: Unchanged Stable, denies SI/HI. Will continue her meds. Will refill prozac for her. Her updated medication list for this problem includes:    Fluoxetine Hcl 20 Mg Tabs (Fluoxetine hcl) .[name]... Take 1 tablet by mouth once a day    Trazodone Hcl 100 Mg Tabs (Trazodone hcl) .[name]... Take 1 tab by mouth at bedtime  Problem # 4:  INSOMNIA (ICD-780.52) Assessment: Comment Only Will give her Remus Loffler as needed for insomnia and asked he rto keep good sleep hygiene and exercise.   Her updated medication list for this problem includes:    Ambien 10  Mg Tabs (Zolpidem tartrate) .[name]... Take 1/2 tab by mouth at bedtime as needed for sleep  Discussed sleep hygiene.   Complete Medication List: 1)  Fluoxetine Hcl 20 Mg Tabs (Fluoxetine hcl) .... Take 1 tablet by mouth once a day 2)  Atenolol 50 Mg Tabs (Atenolol) .... Take 1 tablet by mouth once a day 3)  Hydrocodone-acetaminophen 10-500 Mg Tabs (Hydrocodone-acetaminophen) .... Take 1 tablet by mouth every 6 hours 4)  Trazodone Hcl 100 Mg Tabs (Trazodone hcl) .... Take 1 tab by mouth at bedtime 5)  Prenatal Vitamins 0.8 Mg Tabs (Prenatal multivit-min-fe-fa) .... Take 1 tablet by mouth once a day 6)  Celebrex 200 Mg Caps (Celecoxib) .... Take 1 tab by mouth once daily maximum daily allowed 2 tablets 7)  Hydrochlorothiazide 25 Mg Tabs (Hydrochlorothiazide) .... Take one (1) by mouth daily 8)  Ambien 10 Mg Tabs (Zolpidem tartrate) .... Take 1/2 tab by mouth at bedtime as needed for sleep  Patient Instructions: 1)  Please schedule a follow-up appointment in 2 months. 2)  Please take your blood pressure daily and bring it with you at next office. 3)  It is important that you exercise regularly at least 20 minutes 5 times a week. If you develop chest pain, have severe difficulty breathing, or feel very tired , stop exercising immediately and seek medical attention. 4)  You need to lose weight. Consider a lower calorie diet and regular exercise.  5)  Check your Blood Pressure regularly. If it is above: you should make an appointment. Prescriptions: FLUOXETINE HCL 20 MG TABS (FLUOXETINE HCL) Take 1 tablet by mouth once a day  #31 x 3   Entered and Authorized by:   Jackson Latino MD   Signed by:   Jackson Latino MD on 07/08/2010   Method used:   Electronically to        [facility] (retail)       [address]       [place], [place]  [address]       Ph: [phone]       Fax: [phone]   RxID:   [id number] AMBIEN 10 MG TABS (ZOLPIDEM TARTRATE) Take 1/2 tab  by mouth at bedtime as needed for sleep  #20 x 0   Entered and Authorized by:  Jackson Latino MD   Signed by:   Jackson Latino MD on 07/08/2010   Method used:   Print then Give to Patient   RxID:   [id number] HYDROCHLOROTHIAZIDE 25 MG TABS (HYDROCHLOROTHIAZIDE) Take one (1) by mouth daily  #31 x 5   Entered and Authorized by:   Jackson Latino MD   Signed by:   Jackson Latino MD on 07/08/2010   Method used:   Electronically to        [facility] (retail)       [address]       [place], [place]  [address]       Ph: [phone]       Fax: [phone]   RxID:   [id number]    Orders Added: 1)  Est. Patient Level IV [51884]    Prevention & Chronic Care Immunizations   Influenza vaccine: Not documented   Influenza vaccine deferral: Refused  (07/08/2010)    Tetanus booster: 02/06/2009: Td    Pneumococcal vaccine: Not documented   Pneumococcal vaccine deferral: Deferred  (02/06/2009)  Other Screening   Pap smear: Not documented   Pap smear action/deferral: Not indicated S/P hysterectomy  (07/08/2010)    Mammogram: No malignancy.  (05/03/2010)   Mammogram action/deferral: Ordered  (02/06/2009)   Smoking status: current  (07/08/2010)   Smoking cessation counseling: yes  (07/08/2010)  Lipids   Total Cholesterol: 177  (02/06/2009)   Lipid panel action/deferral: Lipid Panel ordered   LDL: 99  (02/06/2009)   LDL Direct: Not documented   HDL: 45  (02/06/2009)   Triglycerides: 165  (02/06/2009)  Hypertension   Last Blood Pressure: 143 / 93  (07/08/2010)   Serum creatinine: 0.93  (12/06/2009)   Serum potassium 4.3  (12/06/2009)    Hypertension flowsheet reviewed?: Yes   Progress toward BP goal: Deteriorated  Self-Management Support :   Personal Goals (by the next clinic visit) :      Personal blood pressure goal: 140/90  (12/06/2009)   Patient will work on the following items until the next clinic visit to reach  self-care goals:     Medications and monitoring: take my medicines every day, bring all of my medications to every visit  (07/08/2010)     Eating: drink diet soda or water instead of juice or soda, eat more vegetables, use fresh or frozen vegetables, eat foods that are low in salt, eat baked foods instead of fried foods, eat fruit for snacks and desserts, limit or avoid alcohol  (07/08/2010)     Activity: take a 30 minute walk every day  (07/08/2010)    Hypertension self-management support: Written self-care plan, Education handout, Pre-printed educational material, Resources for patients handout  (07/08/2010)   Hypertension self-care plan printed.   Hypertension education handout printed      Resource handout printed.    Vital Signs:  Patient profile:   50 year old female Height:      65 inches (165.10 cm) Weight:      224.03 pounds (101.83 kg) BMI:     37.42 Temp:     97.9 degrees F (36.61 degrees C) oral Pulse rate:   60 / minute BP sitting:   143 / 93  (right arm)  Vitals Entered By: Angelina Ok RN (July 08, 2010 10:30 AM) [facility] at [facility] on [address].Angelina Ok RN  July 08, 2010 11:38 AM

## 2010-09-10 NOTE — Assessment & Plan Note (Signed)
Summary: BP/SB.   Vital Signs:  Patient profile:   50 year old female Height:      65 inches Weight:      213.9 pounds BMI:     35.72 Temp:     97.2 degrees F oral Pulse rate:   84 / minute BP sitting:   133 / 89  (right arm)  Vitals Entered By: Filomena Jungling NT II (May 06, 2010 9:47 AM) CC: Preventive Care Nutritional Status BMI of > 30 = obese  Have you ever been in a relationship where you felt threatened, hurt or afraid?No   Does patient need assistance? Functional Status Self care Ambulation Normal   Primary Care Shoaib Siefker:  Clerance Lav MD  CC:  Preventive Care.  History of Present Illness: Pt is a 50 yo AAF with PMH of HTN, depression, PSA who came here for:  1) R shoulder mass - Started 3 years ago, getting progressively bigger over last 6 months, causing progressively worsening numbness and tingling, which are worse when she lays on her arm. States is very painful to touch directly, and with radiating pain down her arm. Pain rated >10/10, constant. Alleviated by Percocet and Marlin Canary PM tablets (states Percocet is left over from Hysterectomy 2 months ago, although per chart, hysterectomy was in 2003?). Wants help with pain relief - says off crack cocaine for a few months. No known trauma to shoulder, no fevers, no chills, no weakness in arms or hands, no coldness to hands.   Preveiously evaluated in 01/2009 at which time the growth was thought to be a lipoma, and shoulder xray showing questionable soft tissues fullness about the lateral aspect of the humerus.  This may correspond to the described soft tissue mass, No evidence of acute osseous process or mass about the proximal right humerus. was negative for abnormal osseous structure or mass around humerous.   Patient was seen 09/23 and received shoulder injection. Pt also had fluid sent off for culture. Culture did not show any bacteria. Other labwork including CBC and ESR were normal.  Patient returns today for follow  up of shoulder mass, moles on her back, and cyst on her scalp.   Current Medications (verified): 1)  Fluoxetine Hcl 20 Mg Tabs (Fluoxetine Hcl) .... Take 1 Tablet By Mouth Once A Day 2)  Hydrochlorothiazide 25 Mg Tabs (Hydrochlorothiazide) .... Take One (1) By Mouth Daily 3)  Hydrocodone-Acetaminophen 10-500 Mg Tabs (Hydrocodone-Acetaminophen) .... Take 1 Tablet By Mouth Every 6 Hours  Allergies (verified): 1)  ! Ibuprofen  Past History:  Past Medical History: Last updated: 02/06/2009 Fibroids- now hysterectomy Pelvic mass- fluid collection? being treated by gynecologist, sugrical intervention planned 07/10. Alcoholism-15 years going through AA not completely sober yet. Smoking- 15 years IV drug use- cocaine on occassions- says she is fighting all sort of addictions.   Past Surgical History: Last updated: 02/06/2009 Hysterectomy- 2003  Family History: Last updated: 02/06/2009 Mom-HTN,  Dad- does not know him Sister- HTN Brother- healthy  Social History: Last updated: 02/06/2009 She is inbetween places She has been in shelter and does not like the place She does not have resources.  Single No kids Smoking 1/2 pack, alcohol occassionally, drug- cocaine occassionaly.   Risk Factors: Exercise: no (12/06/2009)  Risk Factors: Smoking Status: current (12/06/2009) Packs/Day: 0.5 (12/06/2009)  Review of Systems      See HPI  Physical Exam  General:  alert and well-developed.  VS reviewed and wnl  Head:  palpable mass and scalp lesions.  8 cm  cyst present on left side of head, raised, filiform Neck:  supple.   Lungs:  normal respiratory effort and normal breath sounds.   Heart:  normal rate and regular rhythm.   Abdomen:  soft and non-tender.   Msk:  decreased rom in right shoulder secondary to pain  some bruising present from injection fluctuant mass present in right shoulder more consistent with cyst, rather than lipoma    Impression &  Recommendations:  Problem # 1:  SYNOVIAL CYST (ICD-727.40) Assessment Unchanged S/P steroid injection on 09/23. As previously mentioned, the mass is fluctuant, mobile, and does not feel firm or lipoma like. Unable to aspirate a large amount of fluid. ESR, TSH, and RF all within normal limits. No white count, afebrile, gm stain negative and no organisms seen. Plan to have pt set up with surgery for further surgical evaluation. 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   Problem # 2:  INSOMNIA (ICD-780.52) Assessment: Unchanged Trazodone 150 mg by mouth at bedtime as pt cannot afford ambien.   Problem # 3:  ESSENTIAL HYPERTENSION, BENIGN (ICD-401.1) Assessment: Improved  Pt cannot tolerate diuretic due to frequency of urination. Will try pt on atenolol.  Blood pressure is well controlled today.  Her updated medication list for this problem includes:    Atenolol 50 Mg Tabs (Atenolol) .[name]... Take 1 tablet by mouth once a day  BP today: 133/89 Prior BP: 142/98 (12/06/2009)  Labs Reviewed: K+: 4.3 (12/06/2009) Creat: : 0.93 (12/06/2009)   Chol: 177 (02/06/2009)   HDL: 45 (02/06/2009)   LDL: 99 (02/06/2009)   TG: 165 (02/06/2009)  Problem # 4:  SEBACEOUS CYST, SCALP (ICD-706.2) Assessment: New As mentioned in problem one, patient needs surgical evaluation to remove both cyst in right shoulder and cyst which is likely sebaceous pilar cyst on left side of scalp. Scalp mass is about 6-8 cm that is not smooth, has flilform projections, and is firm to palpation. It has been present for over 15 years and has recently gotten bigger.  Plan: Surgical removal   Problem # 5:  Preventive Health Care (ICD-V70.0) Vitamins daily.   Problem # 6:  INADEQUATE MATERIAL RESOURCES (ICD-V60.2) Has met with Lupita Leash T who has provided patient with different materials and has provided more information regarding resources. Pt will  also meet with Jaynee Eagles for financial assistance and to receive orange card.  Complete Medication List: 1)  Fluoxetine Hcl 20 Mg Tabs (Fluoxetine hcl) .... Take 1 tablet by mouth once a day 2)  Atenolol 50 Mg Tabs (Atenolol) .... Take 1 tablet by mouth once a day 3)  Hydrocodone-acetaminophen 10-500 Mg Tabs (Hydrocodone-acetaminophen) .... Take 1 tablet by mouth every 6 hours 4)  Trazodone Hcl 100 Mg Tabs (Trazodone hcl) .... Take 1 tab by mouth at bedtime 5)  Prenatal Vitamins 0.8 Mg Tabs (Prenatal multivit-min-fe-fa) .... Take 1 tablet by mouth once a day 6)  Celebrex 200 Mg Caps (Celecoxib) .... Take 1 tab by mouth once daily maximum daily allowed 2 tablets  Osteoporosis Risk Assessment:  Risk Factors for Fracture or Low Bone Density:   Smoking status:       current  Immunization & Chemoprophylaxis:    Tetanus vaccine: Td  (02/06/2009)  Patient Instructions: 1)  Please follow up with surgery as scheduled. 2)  Please schedule an appointment after your appointment with surgeon.  3)  Please take atenolol for  blood pressure. 4)  Please take celebrex for pain and inflammation. 5)  Please take multi-vitamin daily Prescriptions: ATENOLOL 50 MG TABS (ATENOLOL) Take 1 tablet by mouth once a day  #31 x 1   Entered and Authorized by:   Melida Quitter MD   Signed by:   Melida Quitter MD on 05/06/2010   Method used:   Print then Give to Patient   RxID:   [id number] PRENATAL VITAMINS 0.8 MG TABS (PRENATAL MULTIVIT-MIN-FE-FA) Take 1 tablet by mouth once a day  #31 x 3   Entered and Authorized by:   Melida Quitter MD   Signed by:   Melida Quitter MD on 05/06/2010   Method used:   Print then Give to Patient   RxID:   [id number] TRAZODONE HCL 100 MG TABS (TRAZODONE HCL) Take 1 tab by mouth at bedtime  #31 x 1   Entered and Authorized by:   Melida Quitter MD   Signed by:   Melida Quitter MD on 05/06/2010   Method used:   Print then Give to Patient   RxID:    [id number]    Prevention & Chronic Care Immunizations   Influenza vaccine: Not documented    Tetanus booster: 02/06/2009: Td    Pneumococcal vaccine: Not documented   Pneumococcal vaccine deferral: Deferred  (02/06/2009)  Other Screening   Pap smear: Not documented    Mammogram: Not documented   Mammogram action/deferral: Ordered  (02/06/2009)   Smoking status: current  (12/06/2009)   Smoking cessation counseling: yes  (12/06/2009)  Lipids   Total Cholesterol: 177  (02/06/2009)   Lipid panel action/deferral: Lipid Panel ordered   LDL: 99  (02/06/2009)   LDL Direct: Not documented   HDL: 45  (02/06/2009)   Triglycerides: 165  (02/06/2009)  Hypertension   Last Blood Pressure: 133 / 89  (05/06/2010)   Serum creatinine: 0.93  (12/06/2009)   Serum potassium 4.3  (12/06/2009)    Hypertension flowsheet reviewed?: Yes   Progress toward BP goal: At goal  Self-Management Support :   Personal Goals (by the next clinic visit) :      Personal blood pressure goal: 140/90  (12/06/2009)   Patient will work on the following items until the next clinic visit to reach self-care goals:     Medications and monitoring: take my medicines every day  (05/06/2010)     Eating: drink diet soda or water instead of juice or soda, eat more vegetables, use fresh or frozen vegetables, eat foods that are low in salt, eat baked foods instead of fried foods, eat fruit for snacks and desserts  (05/06/2010)     Activity: take a 30 minute walk every day  (05/06/2010)    Hypertension self-management support: Education handout, Resources for patients handout  (05/06/2010)   Hypertension education handout printed      Resource handout printed.

## 2010-09-10 NOTE — Progress Notes (Signed)
Summary: Soc. 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 Work Call placed to: Patient Summary of Call: Patient left message for Soc. Work.  No one from [facility] called her back.  I told her I will call them again but also she could drop by there to make appmt.    I've also advised her about the change in location for the [facility].

## 2010-09-10 NOTE — Assessment & Plan Note (Signed)
Summary: Shoulder pain and "mass"   Vital Signs:  Patient profile:   50 year old female Height:      65 inches (165.10 cm) Weight:      213 pounds (96.95 kg) BMI:     35.57 BSA:     2.03 Temp:     97.5 degrees F (36.39 degrees C) oral Pulse rate:   88 / minute Resp:     18 per minute BP sitting:   127 / 84  (left arm) Cuff size:   regular  Vitals Entered By: Theotis Barrio NT II (May 03, 2010 2:03 PM) CC: Rt shoulder pain Is Patient Diabetic? Yes Pain Assessment Patient in pain? yes     Location: shoulder Intensity: 10 Type: sharp Onset of pain  increase pain 2 weeks  Have you ever been in a relationship where you felt threatened, hurt or afraid?No   Does patient need assistance? Functional Status Self care Ambulation Normal   Primary Care Provider:  Clerance Lav MD  CC:  Rt shoulder pain.  History of Present Illness: Pt is a 50 yo F with PMHx of HTN, PSA, who presents to clinic today with concerns regarding an enlarging right shoulder "mass", with details as following:  1) R shoulder mass - Started 3 years ago, getting progressively bigger over last 6 months, causing progressively worsening numbness and tingling, which are worse when she lays on her arm. States is very painful to touch directly, and with radiating pain down her arm. Pain rated >10/10, constant. Alleviated by Percocet and Marlin Canary PM tablets (states Percocet is left over from Hysterectomy 2 months ago, although per chart, hysterectomy was in 2003?). Wants help with pain relief - says off crack cocaine for a few months. No known trauma to shoulder, no fevers, no chills, no weakness in arms or hands, no coldness to hands.   Preveiously evaluated in 01/2009 at which time the growth was thought to be a lipoma, and shoulder xray showing questionable soft tissues fullness about the lateral aspect of the humerus.  This may correspond to the described soft tissue mass, No evidence of acute osseous process or  mass about the proximal right humerus. was negative for abnormal osseous structure or mass around humerous.         Preventive Screening-Counseling & Management  Alcohol-Tobacco     Smoking Status: current     Smoking Cessation Counseling: yes     Packs/Day: 0.5  Caffeine-Diet-Exercise     Does Patient Exercise: no  Current Medications (verified): 1)  Fluoxetine Hcl 20 Mg Tabs (Fluoxetine Hcl) .... Take 1 Tablet By Mouth Once A Day 2)  Hydrochlorothiazide 25 Mg Tabs (Hydrochlorothiazide) .... Take One (1) By Mouth Daily  Allergies: 1)  ! Ibuprofen  Review of Systems       Per HPI  Physical Exam  General:  Well-developed,well-nourished,in no acute distress; alert,appropriate and cooperative throughout examination Lungs:  Normal respiratory effort, chest expands symmetrically. Lungs are clear to auscultation, no crackles or wheezes. Heart:  Normal rate and regular rhythm. S1 and S2 normal without gallop, murmur, click, rub or other extra sounds. Abdomen:  Bowel sounds positive,abdomen soft and non-tender    Impression & Recommendations:  Problem # 1:  SYNOVIAL CYST (ICD-727.40) Apiration of synovial cyst performed. Small amount of serosanguionou fluid aspirated. May be lipoma vs other soft tissue mass. Likely needs surgical evaluation. Will work with SW since she is uninsured. Ortho vs. Gen Surg given location.  Orders: T-TSH [id number]) T-Rheumatoid  Factor [phone]) T-Culture, Wound ([id number]) Joint Aspirate / Injection, Intermediate (09811)  Complete Medication List: 1)  Fluoxetine Hcl 20 Mg Tabs (Fluoxetine hcl) .... Take 1 tablet by mouth once a day 2)  Atenolol 50 Mg Tabs (Atenolol) .... Take 1 tablet by mouth once a day 3)  Hydrocodone-acetaminophen 10-500 Mg Tabs (Hydrocodone-acetaminophen) .... Take 1 tablet by mouth every 6 hours 4)  Trazodone Hcl 100 Mg Tabs (Trazodone hcl) .... Take 1 tab by mouth at bedtime 5)  Prenatal Vitamins 0.8 Mg Tabs  (Prenatal multivit-min-fe-fa) .... Take 1 tablet by mouth once a day 6)  Celebrex 200 Mg Caps (Celecoxib) .... Take 1 tab by mouth once daily maximum daily allowed 2 tablets  Other Orders: T-CBC w/Diff ([id number]) T-Sed Rate (Automated) ([id number])  Patient Instructions: 1)  F/U 1 week 2)  Ice shoulder for 20 minutes 2-3 times this evening. Prescriptions: HYDROCODONE-ACETAMINOPHEN 10-500 MG TABS (HYDROCODONE-ACETAMINOPHEN) Take 1 tablet by mouth every 6 hours  #30 x 0   Entered and Authorized by:   Julaine Fusi  DO   Signed by:   Julaine Fusi  DO on 05/03/2010   Method used:   Print then Give to Patient   RxID:   [id number]  Process Orders Check Orders Results:     [facility]: ABN not required for this insurance Tests Sent for requisitioning (May 20, 2010 10:35 AM):     05/03/2010: [facility] -- T-CBC w/Diff [[id number]] (signed)     05/03/2010: [facility] -- T-Sed Rate (Automated) [phone] (signed)     05/03/2010: [facility] -- T-TSH [id number] (signed)     05/03/2010: [facility] -- T-Rheumatoid Factor [id number] (signed)     05/03/2010: [facility] -- T-Culture, Wound [[id number]] (signed)     Procedure Note Last Tetanus: Td (02/06/2009)  Cyst Removal: The patient complains of pain, inflammation, tenderness, and swelling but denies redness, changing mole, foreign body, suspicious lesion, changing lesion, discharge, and fever. Indication: Swelling and pain  Procedure #1: Aspiration with 18g Needle attempted    Comment: Large fluctuent mass in anterior shoulder/AC region-seems to communicate with upper AC joint and shoulder synovium. Mobile, slightly painful    Anesthesia: 1% lidocaine w/o epinephrine  Cleaned and prepped with: alcohol and betadine Wound dressing: bandaid Instructions: ice, out of work, and RTC in 24 hrs Additional  Instructions: Sent fluid for culture. Not enough for cytology.   Prevention & Chronic Care Immunizations   Influenza vaccine: Not documented    Tetanus booster: 02/06/2009: Td    Pneumococcal vaccine: Not documented   Pneumococcal vaccine deferral: Deferred  (02/06/2009)  Other Screening   Pap smear: Not documented    Mammogram: Not documented   Mammogram action/deferral: Ordered  (02/06/2009)   Smoking status: current  (05/03/2010)   Smoking cessation counseling: yes  (05/03/2010)  Lipids   Total Cholesterol: 177  (02/06/2009)   Lipid panel action/deferral: Lipid Panel ordered   LDL: 99  (02/06/2009)   LDL Direct: Not documented   HDL: 45  (02/06/2009)   Triglycerides: 165  (02/06/2009)  Hypertension   Last Blood Pressure: 127 / 84  (05/03/2010)   Serum creatinine: 0.93  (12/06/2009)   Serum potassium 4.3  (12/06/2009)  Self-Management Support :   Personal Goals (by the next clinic visit) :      Personal blood pressure goal: 140/90  (12/06/2009)   Hypertension self-management support: Not documented  Process Orders Check Orders Results:     [facility]: ABN not  required for this insurance Tests Sent for requisitioning (May 20, 2010 10:35 AM):     05/03/2010: [facility] -- T-CBC w/Diff [[id number]] (signed)     05/03/2010: [facility] -- T-Sed Rate (Automated) [[id number]] (signed)     05/03/2010: [facility] -- T-TSH [phone] (signed)     05/03/2010: [facility] -- T-Rheumatoid Factor [phone] (signed)     05/03/2010: [facility] -- T-Culture, Wound [[id number]] (signed)

## 2010-09-10 NOTE — Miscellaneous (Signed)
Summary: INFORMED CONSENT MEDICAL /SURGICAL  INFORMED CONSENT MEDICAL /SURGICAL   Imported By: Margie Billet 05/14/2010 15:03:01  _____________________________________________________________________  External Attachment:    Type:   Image     Comment:   External Document
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
## 2010-09-10 NOTE — Assessment & Plan Note (Signed)
Summary: bp   Vital Signs:  Patient profile:   50 year old female Height:      65 inches (165.10 cm) Weight:      221.4 pounds (100.64 kg) BMI:     36.98 Temp:     98.6 degrees F (37.00 degrees C) oral Pulse rate:   56 / minute BP sitting:   150 / 100  (left arm) Cuff size:   regular  Vitals Entered By: Cynda Familia Duncan Dull) (July 12, 2010 3:31 PM) CC: f/u HTN, ? sinus HA Is Patient Diabetic? No Nutritional Status BMI of > 30 = obese  Have you ever been in a relationship where you felt threatened, hurt or afraid?No   Does patient need assistance? Functional Status Self care Ambulation Normal   Primary Care Provider:  Clerance Lav MD  CC:  f/u HTN and ? sinus HA.  History of Present Illness: Pt is a 50 yo F with PMHx of HTN and depression who presents to clinic as a walk-in for HTN management. She was restarted on her meds which consist of atenolol and HCTZ, and states her blood pressure is still elevated. She is also having headaches, that she feels may be related to high blood pressure and sinuses.   She states she took 2 atenolol and 1 HCTZ this morning and that yesterday she took 3 atenolol. She states she checks her bp at the local pharmacy and notes it to be in the 160s range systolic, and 110-112 diastolic. She is extremely concerned about her blood pressure. She is very anxious and would like something to take immediately for her blood pressure.   She denies chest pain, cough, shortness of breath, and says her headache has improved since taking 2 percocets about 20 minutes ago.     Depression History:      The patient denies a depressed mood most of the day and a diminished interest in her usual daily activities.        Preventive Screening-Counseling & Management  Alcohol-Tobacco     Smoking Status: current     Smoking Cessation Counseling: yes     Packs/Day: 0.5  Current Medications (verified): 1)  Fluoxetine Hcl 20 Mg Tabs (Fluoxetine Hcl) .... Take  1 Tablet By Mouth Once A Day 2)  Atenolol 50 Mg Tabs (Atenolol) .... Take 1 Tablet By Mouth Once A Day 3)  Percocet 5-325 Mg Tabs (Oxycodone-Acetaminophen) .... Take 1 Tab Every 6 Hours As Needed For Pain. (Was Prescribed By Ed, Given 20 Tabs) 4)  Prenatal Vitamins 0.8 Mg Tabs (Prenatal Multivit-Min-Fe-Fa) .... Take 1 Tablet By Mouth Once A Day 5)  Celebrex 200 Mg Caps (Celecoxib) .... Take 1 Tab By Mouth Once Daily Maximum Daily Allowed 2 Tablets 6)  Hydrochlorothiazide 25 Mg Tabs (Hydrochlorothiazide) .... Take One (1) By Mouth Daily  Allergies: 1)  ! Ibuprofen  Past History:  Past Medical History: Last updated: 02/06/2009 Fibroids- now hysterectomy Pelvic mass- fluid collection? being treated by gynecologist, sugrical intervention planned 07/10. Alcoholism-15 years going through AA not completely sober yet. Smoking- 15 years IV drug use- cocaine on occassions- says she is fighting all sort of addictions.   Past Surgical History: Last updated: 02/06/2009 Hysterectomy- 2003  Family History: Last updated: 02/06/2009 Mom-HTN,  Dad- does not know him Sister- HTN Brother- healthy  Social History: Last updated: 02/06/2009 She is inbetween places She has been in shelter and does not like the place She does not have resources.  Single No kids Smoking  1/2 pack, alcohol occassionally, drug- cocaine occassionaly.   Risk Factors: Exercise: no (05/03/2010)  Risk Factors: Smoking Status: current (07/12/2010) Packs/Day: 0.5 (07/12/2010)  Review of Systems      See HPI  Physical Exam  General:  alert and well-developed.  BP rechecked manually, and was 150/90 Head:  normocephalic and atraumatic.   Eyes:  vision grossly intact, pupils equal, pupils round, and pupils reactive to light.   Neck:  supple.   Lungs:  normal respiratory effort and normal breath sounds.   Heart:  normal rate, regular rhythm, no murmur, and no gallop.   Abdomen:  soft and non-tender.   Extremities:   no edema noted  Psych:  moderately anxious and poor concentration.     Impression & Recommendations:  Problem # 1:  ESSENTIAL HYPERTENSION, BENIGN (ICD-401.1) Assessment Deteriorated Pt extremely distressed about elevated blood pressure, that she has been self-titrating up her atenolol. I explained to her that it is dangerous to do this, and that it would be better to add a third agent. She states she can only obtain medications through the health department as she can get them for free. Looking at the available options at the health department, I will choose Clonidine and advise to take it at bedtime since it may cause some drowsiness and she already has trouble falling asleep. Her blood pressure when rechecked was 150/90, and thus I advised her to continue taking Atenolol and HCTZ at current dose, once daily. I will have her follow up in 2 weeks to recheck blood pressure. Will also check renal function and electrolytes today.  Her updated medication list for this problem includes:    Atenolol 50 Mg Tabs (Atenolol) .[name]... Take 1 tablet by mouth once a day    Hydrochlorothiazide 25 Mg Tabs (Hydrochlorothiazide) .[name]... Take one (1) by mouth daily    Clonidine Hcl 0.2 Mg Tabs (Clonidine hcl) .[name]... Take 1 tab by mouth at bedtime  BP today: 150/100 Prior BP: 143/93 (07/08/2010)  Labs Reviewed: K+: 4.3 (12/06/2009) Creat: : 0.93 (12/06/2009)   Chol: 177 (02/06/2009)   HDL: 45 (02/06/2009)   LDL: 99 (02/06/2009)   TG: 165 (02/06/2009)  Orders: T-Basic Metabolic Panel [id number])  Problem # 2:  INSOMNIA (ICD-780.52) Patient states she cannot afford ambien, and that trazodone does not work. She states she can't afford OTC Benadryl. Will try patient on low dose amitriptyline and have pt follow up in 2 weeks.   The following medications were removed from the medication list:    Ambien 10 Mg Tabs (Zolpidem tartrate) .[name]... Take 1/2 tab by mouth at bedtime as needed for sleep  Problem # 3:   DEPRESSION, MILD (ICD-311) States that the prozac is helping with her depression, and that she does not have any further worsening of symptoms. She does complain of insomnia, but feels like that is not particularly related to her depression. If amitriptyline does not work, would consider increasing prozac dose.  The following medications were removed from the medication list:    Trazodone Hcl 100 Mg Tabs (Trazodone hcl) .[name]... Take 1 tab by mouth at bedtime Her updated medication list for this problem includes:    Fluoxetine Hcl 20 Mg Tabs (Fluoxetine hcl) .[name]... Take 1 tablet by mouth once a day    Amitriptyline Hcl 50 Mg Tabs (Amitriptyline hcl) .[name]... Take 1 tab by mouth at bedtime  Complete Medication List: 1)  Fluoxetine Hcl 20 Mg Tabs (Fluoxetine hcl) .... Take 1 tablet by mouth once a day  2)  Atenolol 50 Mg Tabs (Atenolol) .... Take 1 tablet by mouth once a day 3)  Percocet 5-325 Mg Tabs (Oxycodone-acetaminophen) .... Take 1 tab every 6 hours as needed for pain. (was prescribed by ed, given 20 tabs) 4)  Prenatal Vitamins 0.8 Mg Tabs (Prenatal multivit-min-fe-fa) .... Take 1 tablet by mouth once a day 5)  Celebrex 200 Mg Caps (Celecoxib) .... Take 1 tab by mouth once daily maximum daily allowed 2 tablets 6)  Hydrochlorothiazide 25 Mg Tabs (Hydrochlorothiazide) .... Take one (1) by mouth daily 7)  Clonidine Hcl 0.2 Mg Tabs (Clonidine hcl) .... Take 1 tab by mouth at bedtime 8)  Amitriptyline Hcl 50 Mg Tabs (Amitriptyline hcl) .... Take 1 tab by mouth at bedtime   Patient Instructions: 1)  Please follow up with Dr. Baltazar Apo in 2 weeks for blood pressure recheck.  2)  Please start taking Clonidine 0.2 mg by mouth at bedtime  3)  Please continue to take Atenolol and HCTZ both ONLY ONCE A DAY.  4)  Please do not increase your dose of blood pressure medicines without discussing with your doctor first.  5)  Please take amitriptyline once at night, to help you sleep.  Prescriptions: AMITRIPTYLINE  HCL 50 MG TABS (AMITRIPTYLINE HCL) Take 1 tab by mouth at bedtime  #30 x 0   Entered and Authorized by:   Melida Quitter MD   Signed by:   Melida Quitter MD on 07/12/2010   Method used:   Faxed to ...       [facility] Department (retail)       [address] [place], [place]  [address]       Ph: [phone]       Fax: [phone]   RxID:   [id number] CLONIDINE HCL 0.2 MG TABS (CLONIDINE HCL) Take 1 tab by mouth at bedtime  #30 x 0   Entered and Authorized by:   Melida Quitter MD   Signed by:   Melida Quitter MD on 07/12/2010   Method used:   Faxed to ...       [facility] Department (retail)       [address] [place], [place]  [address]       Ph: [phone]       Fax: [phone]   RxID:   [id number]    Orders Added: 1)  T-Basic Metabolic Panel [phone] 2)  Est. Patient Level III [76160]   Process Orders Check Orders Results:     [facility]: ABN not required for this insurance Tests Sent for requisitioning (July 12, 2010 4:38 PM):     07/12/2010: [facility] -- T-Basic Metabolic Panel [phone] (signed)     Prevention & Chronic Care Immunizations   Influenza vaccine: Not documented   Influenza vaccine deferral: Refused  (07/12/2010)    Tetanus booster: 02/06/2009: Td    Pneumococcal vaccine: Not documented   Pneumococcal vaccine deferral: Deferred  (02/06/2009)  Other Screening   Pap smear: Not documented   Pap smear action/deferral: Not indicated S/P hysterectomy  (07/08/2010)    Mammogram: No malignancy.  (05/03/2010)   Mammogram action/deferral: Ordered  (02/06/2009)   Smoking status: current  (07/12/2010)   Smoking cessation counseling: yes  (07/12/2010)  Lipids   Total Cholesterol: 177  (02/06/2009)   Lipid panel action/deferral: Lipid Panel ordered   LDL: 99  (02/06/2009)   LDL Direct: Not documented  HDL: 45  (02/06/2009)   Triglycerides: 165   (02/06/2009)  Hypertension   Last Blood Pressure: 150 / 100  (07/12/2010)   Serum creatinine: 0.93  (12/06/2009)   Serum potassium 4.3  (12/06/2009)    Hypertension flowsheet reviewed?: Yes   Progress toward BP goal: Deteriorated  Self-Management Support :   Personal Goals (by the next clinic visit) :      Personal blood pressure goal: 140/90  (12/06/2009)   Hypertension self-management support: Written self-care plan, Education handout, Pre-printed educational material, Resources for patients handout  (07/08/2010)

## 2010-09-10 NOTE — Assessment & Plan Note (Signed)
Summary: Social Work  Soc. Work.  30 minutes.  Homeless patient in need of intensive social work services.  Depression and hx of cocaine dependency.   The patient is transient and living with whomever will take her in.  Right now she is living with a lady she knows from church. She gets $200 a month in foodstamps. She cannot keep a job due to homelessness and hx of drug use.   She worked in [employer] over the years.   Suspect there is more than just depression going on.  Her scattered thoughts and verbalization, drug history, and joblessness suggest bipolar disorder.     I have called [facility] today and left message to schedule assessment and counseling.  I have referred her to [facility] for consult and encouraged her to get a housing application in.   They can best advise her as to what her best option is without having  income right now.  They occasionally have housing for the disabled.    She has the phone number for [facility] for case mgmt services and I will contact Renee to ensure connection.     Pat. Cell # is [phone].  [phone] [employer] card given today as patient reporting without food and asking if I can feed her.

## 2010-09-10 NOTE — Progress Notes (Signed)
Summary: Walk -in/gg  Phone Note Call from Patient   Summary of Call: Pt stopped by clinic and feels her BP is  not coming down.  Today it was 153/96.  She was started on atenolol in past  but ran out and has been off for 3 weeks.  She saw Dr Threasa Beards on 11/28 and has been on atenolol since then.  She took 3 doses yesterday because she feels BP is not coming down.  She is also on HCTZ.  She gets "BP headache in afternoon" She wants to know what to do, should she wait and see what to her pressure or should she be seen?   Initial call taken by: Merrie Roof RN,  July 12, 2010 3:10 PM  Follow-up for Phone Call        [name] should not be taking her medications outside of the range they were prescribed.  Three doses of atenolol in one day could lead to a heart block.  It is unlikely the headace is secondary to hypertension if she only has stage I HTN now.  She needs to be educated on her medications, encouraged to allow them to work if taken, and assessed for her headache.  If we have an opening today we can assess her and educate her on the dangers of her self prescription. Follow-up by: Doneen Poisson MD,  July 12, 2010 3:20 PM  Additional Follow-up for Phone Call Additional follow up Details #1::        Will add to schedule Additional Follow-up by: Merrie Roof RN,  July 12, 2010 3:24 PM

## 2010-09-10 NOTE — Assessment & Plan Note (Signed)
Summary: Soc. Work  Soc. Work.  30 min.  Refererd by Dr. Baltazar Apo and [facility]. Patient continues to be homeless, chaotic and in crisis.   She does not continue to f/u with [facility] and never connected with [facility] for mental health counseling.   She tells me she is living out of a shed right now.   She is asking over and over again about her medications and what they are for.  I have explained her medications to her including what is for blood pressure and sleep.  She insists she needs to speak with the doctor again and so Dr. Baltazar Apo came by and reexplained medications to her.   She has an appointment with [facility] this PM.   In the meantime I have given her a fresh blanket, waters, shampoo and conditioner.   She is again asking for money and [employer] cards.  I have already given her $50 worth of grocery cards and declined to give her any money today due to hx of substance abuse.  She has a cell phone and she mentions a mother who is in the picture.  She will come back tomorrow at 10:30 AM to work with SW for further assessment and problem-solving since she is making little progress re: housing and mental health.

## 2010-09-10 NOTE — Progress Notes (Signed)
Summary: Soc. 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 Work Call placed to: [employer] of Call: Spoke with Oretha Milch and she has tried to get up with Ms. Frysinger at least three times but she has no showed for her appmts without calling.   Luster Landsberg will attempt contact once again.

## 2010-09-10 NOTE — Assessment & Plan Note (Signed)
Summary: Social Work  30 min.  Social Work.  Met with Jennifer [name] for SW f/u.    Jennifer [name] informs me that she has finally connected to [facility] for therapy and it is going very well.  Her therapist is Ms. Su Grand and she has an appmt tomorrow at 2 PM.  She has been to [facility] at least 4 or 5 times.  She does not know what her MH diagnoses are but so far they have not recommended she see the psychiatrist for medications however the therapist has encouraged her to take responsibility and control of her anger.   Jennifer [name] continues to live with her aunt in some sort of shed.   She is contemplating filing for disability however I've advised her it would be helpful to have the records from [facility] for [facility] diagnoses otherwise there will not be a basis to file. We have a signed release and I will request those records.   $10 [employer] card given today.  I encouraged her to work with [facility] and [facility] for needed medications as I have no funding available.   SW f/u for [facility] records and ongoing needs and issues.

## 2010-09-12 NOTE — Assessment & Plan Note (Signed)
Summary: swollen eye/gg   Vital Signs:  Patient profile:   50 year old female Height:      65 inches (165.10 cm) Weight:      221 pounds (100.45 kg) BMI:     36.91 Temp:     97.5 degrees F (36.39 degrees C) Pulse rate:   54 / minute Resp:     20 per minute BP sitting:   143 / 95  (right arm) Cuff size:   regular  Vitals Entered By: Chinita Pester RN (August 27, 2010 11:08 AM) CC: Right eye "Stye"? x 2 weeks - went to ED over the w/e. Pt. ststes she has tried warm compresses. Also wants HIV test. Is Patient Diabetic? No Pain Assessment Patient in pain? yes     Location: right eye Intensity: 10 Type: irritating Onset of pain  Constant Nutritional Status BMI of > 30 = obese  Have you ever been in a relationship where you felt threatened, hurt or afraid?No   Does patient need assistance? Functional Status Self care Ambulation Normal   Primary Care Provider:  Clerance Lav MD  CC:  Right eye "Stye"? x 2 weeks - went to ED over the w/e. Pt. ststes she has tried warm compresses. Also wants HIV test..  History of Present Illness: Please refer to the A/P for the HPI / status of the pts multiple chronic medical problems.   Depression History:      The patient denies a depressed mood most of the day and a diminished interest in her usual daily activities.        Comments:  Taking Prozac.   Preventive Screening-Counseling & Management  Alcohol-Tobacco     Alcohol drinks/day: 0     Smoking Status: current     Smoking Cessation Counseling: yes     Packs/Day: 0.5  Caffeine-Diet-Exercise     Does Patient Exercise: no  Allergies: 1)  ! Ibuprofen  Review of Systems Eyes:  Complains of discharge, eye irritation, and eye pain; denies blurring, vision loss-1 eye, and vision loss-both eyes.  Physical Exam  General:  alert, well-developed, well-nourished, and well-hydrated.   Head:  normocephalic, atraumatic, and no abnormalities observed.   Eyes:  vision grossly intact,  pupils equal, pupils round, pupils reactive to light, and hordeolum R upper eyelid. No flucuence, + erythema, Non tender, large, aout 0.5cm Ears:  R ear normal and L ear normal.   Nose:  no external deformity, no external erythema, and no nasal discharge.   Neurologic:  alert & oriented X3 and gait normal.   Skin:  turgor normal and no rashes.   Psych:  Oriented X3, memory intact for recent and remote, normally interactive, good eye contact, not anxious appearing, and not depressed appearing.     Impression & Recommendations:  Problem # 1:  HORDEOLUM EXTERNUM (ICD-373.11) She has had this for 2 weeks and it is getting worse. She saw the ER over the weekend and started using hot compresses 2 days ago without reflief. She think it is draining at night bc there is crusting around that eye. Never had stye before, no other eye problems previosuly. Not affecting eye sight but she is self conscious. Encouraged her to cont warm compresses and reassuraed her .Provided UTD ifno. Referred to optho for ? i&D.   Orders: Ophthalmology Referral (Ophthalmology)  Problem # 2:  HIV EXPOSURE, POSSIBLE (ICD-V15.89) She heard rumers that a former sexual partner was HIV +. She asked him and he denied. Exposure was 2  months ago. No sxs. Requests HIV test. If negative, will need repeat test due to timing. I may call her at [phone] to give her resutls regardless of the results.  Orders: T-HIV Antibody  (Reflex) ([id number])  Complete Medication List: 1)  Fluoxetine Hcl 20 Mg Tabs (Fluoxetine hcl) .... Take 1 tablet by mouth once a day 2)  Atenolol 50 Mg Tabs (Atenolol) .... Take 1 tablet by mouth once a day 3)  Prenatal Vitamins 0.8 Mg Tabs (Prenatal multivit-min-fe-fa) .... Take 1 tablet by mouth once a day 4)  Celebrex 200 Mg Caps (Celecoxib) .... Take 1 tab by mouth once daily maximum daily allowed 2 tablets 5)  Hydrochlorothiazide 25 Mg Tabs (Hydrochlorothiazide) .... Take one (1) by mouth daily 6)   Clonidine Hcl 0.2 Mg Tabs (Clonidine hcl) .... Take 1 tab by mouth at bedtime 7)  Ambien 10 Mg Tabs (Zolpidem tartrate) .... Take 1 tab by mouth at bedtime  Patient Instructions: 1)  I will call you with your blood test results. 2)  If you di not hear from [place] please call us to get results.   Orders Added: 1)  T-HIV Antibody  (Reflex) [[id number]] 2)  Ophthalmology Referral [Ophthalmology] 3)  Est. Patient Level III [62952]   Process Orders Check Orders Results:     [facility]: ABN not required for this insurance Tests Sent for requisitioning (August 27, 2010 1:42 PM):     08/27/2010: [facility] -- T-HIV Antibody  (Reflex) [[id number]] (signed)

## 2010-09-12 NOTE — Progress Notes (Signed)
Summary: eye appt/ hla  Phone Note Other Incoming   Summary of Call: [facility], calls to say pt has appt w/ dr Dione Booze tues 1/31, may call her at [phone] ext [phone], states her fax machine is not working correctly Initial call taken by: Marin Roberts RN,  September 04, 2010 4:33 PM  Follow-up for Phone Call        I received  the fax yesterday and I called the pt. who was  awared of the appt. Thanks Follow-up by: Chinita Pester RN,  September 05, 2010 8:43 AM

## 2010-09-12 NOTE — Assessment & Plan Note (Signed)
Summary: acute/shah/2 week recheck/ch   Vital Signs:  Patient profile:   50 year old female Height:      65 inches (165.10 cm) Weight:      221.03 pounds (100.47 kg) BMI:     36.91 Temp:     97.4 degrees F (36.33 degrees C) oral Pulse rate:   62 / minute BP sitting:   130 / 92  (right arm)  Vitals Entered By: Angelina Ok RN (July 26, 2010 10:41 AM) CC: Depression Is Patient Diabetic? No Pain Assessment Patient in pain? yes     Location: right shoulder Intensity: 9 Type: aching Onset of pain  Constant Nutritional Status BMI of > 30 = obese  Have you ever been in a relationship where you felt threatened, hurt or afraid?No   Does patient need assistance? Functional Status Self care Ambulation Normal Comments Pain in her right shoulder.    Results of labs.  Pap Smear.  Needs refills on meds.  HCTZ, Prenatal vitamins.  Needs something for sleep.  Has not tried the Ambien.   Primary Care Oliviana Mcgahee:  Clerance Lav MD  CC:  Depression.  History of Present Illness: Pt is a 50 yo F with PMHx of HTN and depression who presents to clinic for follow up regarding HTN. She was started on clonidine in addition to HCTZ and Atenolol. Her blood pressure is well controlled today, but she states that sometimes it will be elevated when she checks it at the local pharmacy. The other issues she would also like to discuss are:  1) Shoulder pain in right shoulder - secondary to cyst. Pt was recommened surgical evaluation, however she declined to follow up with that appt due to cost. She states there are no medications beside Percocet that will help with the pain. She states she would like another prescription for Percocet.   She denies any other complaints today.    Depression History:      The patient denies a depressed mood most of the day and a diminished interest in her usual daily activities.        Comments:  Sees a therapist.  wants her to get Lithium for you  Bi-polar.   Preventive Screening-Counseling & Management  Alcohol-Tobacco     Smoking Status: current     Smoking Cessation Counseling: yes     Packs/Day: 0.5  Current Medications (verified): 1)  Fluoxetine Hcl 20 Mg Tabs (Fluoxetine Hcl) .... Take 1 Tablet By Mouth Once A Day 2)  Atenolol 50 Mg Tabs (Atenolol) .... Take 1 Tablet By Mouth Once A Day 3)  Percocet 5-325 Mg Tabs (Oxycodone-Acetaminophen) .... Take 1 Tab Every 6 Hours As Needed For Pain. (Was Prescribed By Ed, Given 20 Tabs) 4)  Prenatal Vitamins 0.8 Mg Tabs (Prenatal Multivit-Min-Fe-Fa) .... Take 1 Tablet By Mouth Once A Day 5)  Celebrex 200 Mg Caps (Celecoxib) .... Take 1 Tab By Mouth Once Daily Maximum Daily Allowed 2 Tablets 6)  Hydrochlorothiazide 25 Mg Tabs (Hydrochlorothiazide) .... Take One (1) By Mouth Daily 7)  Clonidine Hcl 0.2 Mg Tabs (Clonidine Hcl) .... Take 1 Tab By Mouth At Bedtime 8)  Amitriptyline Hcl 50 Mg Tabs (Amitriptyline Hcl) .... Take 1 Tab By Mouth At Bedtime 9)  Ambien 10 Mg Tabs (Zolpidem Tartrate) .... Take 1 Tab By Mouth At Bedtime  Allergies (verified): 1)  ! Ibuprofen  Past History:  Past Medical History: Last updated: 02/06/2009 Fibroids- now hysterectomy Pelvic mass- fluid collection? being treated by gynecologist, sugrical intervention  planned 07/10. Alcoholism-15 years going through AA not completely sober yet. Smoking- 15 years IV drug use- cocaine on occassions- says she is fighting all sort of addictions.   Past Surgical History: Last updated: 02/06/2009 Hysterectomy- 2003  Family History: Last updated: 02/06/2009 Mom-HTN,  Dad- does not know him Sister- HTN Brother- healthy  Social History: Last updated: 02/06/2009 She is inbetween places She has been in shelter and does not like the place She does not have resources.  Single No kids Smoking 1/2 pack, alcohol occassionally, drug- cocaine occassionaly.   Risk Factors: Exercise: no (05/03/2010)  Risk  Factors: Smoking Status: current (07/26/2010) Packs/Day: 0.5 (07/26/2010)  Review of Systems General:  Denies fatigue, fever, loss of appetite, and weakness. CV:  Denies chest pain or discomfort, fatigue, shortness of breath with exertion, and swelling of feet. Resp:  Denies cough and shortness of breath. GI:  Denies abdominal pain, nausea, and vomiting. GU:  Denies discharge, urinary frequency, and urinary hesitancy.  Physical Exam  General:  alert and well-developed.  BP reviewed and wnl Head:  normocephalic, atraumatic, no abnormalities observed, and no abnormalities palpated.   Neck:  supple.   Lungs:  normal respiratory effort and normal breath sounds.   Heart:  normal rate and regular rhythm.   Abdomen:  soft and non-tender.   Msk:  normal ROM, no joint tenderness, no joint swelling, no joint warmth, no redness over joints, and no crepitation.    small approx 3 cm firm, mobile mass in right shoulder, slightly tender to palpation, but no erythema, no warmth to touch Extremities:  no edema  Neurologic:  nonfocal  Psych:  labile affect, moderately anxious, easily distracted, poor concentration, judgment poor, and hyperactive.     Impression & Recommendations:  Problem # 1:  INSOMNIA (ICD-780.52) Pt states amitriptyline did not help and wants to resume ambien. Will give her a prescription. Previously she couldn't afford the Ambien, but says she will be able to afford it this time.  Her updated medication list for this problem includes:    Ambien 10 Mg Tabs (Zolpidem tartrate) .[name]... 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 She was prescribed percocet by the ED and would like another prescription. Given her prior drug abuse and alcohol history, she would not be a good candidate for opiates. Furthermore, what she needs  is surgery to evaluate the cyst for possible excision and removal, rather than opiates. Told patient that she would not receive narcs today. She pulled out a script for Percocet written by [facility] in June 2011 and said she would try to get it filled.  Pt will be seen by sports medicine 08/2010, for possible steroid injection.  Problem # 3:  ESSENTIAL HYPERTENSION, BENIGN (ICD-401.1) BP well controlled. HR is stable with the addition of clonidine. Will continue current regimen.   Her updated medication list for this problem includes:    Atenolol 50 Mg Tabs (Atenolol) .[name]... Take 1 tablet by mouth once a day    Hydrochlorothiazide 25 Mg Tabs (Hydrochlorothiazide) .[name]... Take one (1) by mouth daily    Clonidine Hcl 0.2 Mg Tabs (Clonidine hcl) .[name]... Take 1 tab by mouth at bedtime  BP today: 130/92 Prior BP: 150/100 (07/12/2010)  Labs Reviewed: K+: 4.1 (07/12/2010) Creat: : 0.79 (07/12/2010)   Chol: 177 (02/06/2009)   HDL:  45 (02/06/2009)   LDL: 99 (02/06/2009)   TG: 165 (02/06/2009)  Complete Medication List: 1)  Fluoxetine Hcl 20 Mg Tabs (Fluoxetine hcl) .... Take 1 tablet by mouth once a day 2)  Atenolol 50 Mg Tabs (Atenolol) .... Take 1 tablet by mouth once a day 3)  Prenatal Vitamins 0.8 Mg Tabs (Prenatal multivit-min-fe-fa) .... Take 1 tablet by mouth once a day 4)  Celebrex 200 Mg Caps (Celecoxib) .... Take 1 tab by mouth once daily maximum daily allowed 2 tablets 5)  Hydrochlorothiazide 25 Mg Tabs (Hydrochlorothiazide) .... Take one (1) by mouth daily 6)  Clonidine Hcl 0.2 Mg Tabs (Clonidine hcl) .... Take 1 tab by mouth at bedtime 7)  Ambien 10 Mg Tabs (Zolpidem tartrate) .... Take 1 tab by mouth at bedtime  Other Orders: Admin of Therapeutic Inj  intramuscular or subcutaneous (16109) Ketorolac-Toradol 15mg  (U0454)  Patient Instructions: 1)  Please follow up with Sports Medicine [id number] on January 3rd, 2011 at 330 pm 2)  Please take all medications as  directed.  3)  Please follow up in 3 months.  Prescriptions: AMBIEN 10 MG TABS (ZOLPIDEM TARTRATE) Take 1 tab by mouth at bedtime  #30 x 0   Entered and Authorized by:   Melida Quitter MD   Signed by:   Melida Quitter MD on 07/26/2010   Method used:   Print then Give to Patient   RxID:   [id number] PRENATAL VITAMINS 0.8 MG TABS (PRENATAL MULTIVIT-MIN-FE-FA) Take 1 tablet by mouth once a day  #31 x 3   Entered and Authorized by:   Melida Quitter MD   Signed by:   Melida Quitter MD on 07/26/2010   Method used:   Faxed to ...       [facility] Department (retail)       [address] [place], [place]  [address]       Ph: [phone]       Fax: [phone]   RxID:   [id number] CLONIDINE HCL 0.2 MG TABS (CLONIDINE HCL) Take 1 tab by mouth at bedtime  #30 x 3   Entered and Authorized by:   Melida Quitter MD   Signed by:   Melida Quitter MD on 07/26/2010   Method used:   Faxed to ...       [facility] Department (retail)       [address] [place], [place]  [address]       Ph: [phone]       Fax: [phone]   RxID:   [id number] FLUOXETINE HCL 20 MG TABS (FLUOXETINE HCL) Take 1 tablet by mouth once a day  #31 x 3   Entered and Authorized by:   Melida Quitter MD   Signed by:   Melida Quitter MD on 07/26/2010   Method used:   Faxed to ...       [facility] Department (retail)       [address] [place], [place]  [address]       Ph: [phone]       Fax: [phone]   RxID:   [id number] ATENOLOL 50 MG TABS (ATENOLOL) Take 1 tablet by mouth once a day  #31 x 3   Entered and Authorized by:   Melida Quitter MD   Signed by:   Melida Quitter MD on 07/26/2010   Method used:  Faxed to ...       [facility] Department (retail)       [address] [place], [place]  [address]       Ph: [phone]       Fax: [phone]   RxID:   [id number]    Medication Administration  Injection # 1:     Medication: Ketorolac-Toradol 15mg     Diagnosis: PAIN IN JOINT, SHOULDER REGION [phone])    Route: IM    Site: R deltoid    Exp Date: 06/12/2011    Lot #: [id number]    Mfr: Novaplus    Comments: Given 60 mg IM    Patient tolerated injection without complications    Given by: Angelina Ok RN (July 26, 2010 11:52 AM)  Orders Added: 1)  Admin of Therapeutic Inj  intramuscular or subcutaneous [96372] 2)  Ketorolac-Toradol 15mg  [J1885] 3)  Est. Patient Level IV [95638]    Prevention & Chronic Care Immunizations   Influenza vaccine: Not documented   Influenza vaccine deferral: Refused  (07/12/2010)    Tetanus booster: 02/06/2009: Td    Pneumococcal vaccine: Not documented   Pneumococcal vaccine deferral: Deferred  (02/06/2009)  Other Screening   Pap smear: Not documented   Pap smear action/deferral: Not indicated S/P hysterectomy  (07/08/2010)    Mammogram: No malignancy.  (05/03/2010)   Mammogram action/deferral: Ordered  (02/06/2009)   Smoking status: current  (07/26/2010)   Smoking cessation counseling: yes  (07/26/2010)  Lipids   Total Cholesterol: 177  (02/06/2009)   Lipid panel action/deferral: Lipid Panel ordered   LDL: 99  (02/06/2009)   LDL Direct: Not documented   HDL: 45  (02/06/2009)   Triglycerides: 165  (02/06/2009)  Hypertension   Last Blood Pressure: 130 / 92  (07/26/2010)   Serum creatinine: 0.79  (07/12/2010)   Serum potassium 4.1  (07/12/2010)  Self-Management Support :   Personal Goals (by the next clinic visit) :      Personal blood pressure goal: 140/90  (12/06/2009)   Patient will work on the following items until the next clinic visit to reach self-care goals:     Medications and monitoring: take my medicines every day, bring all of my medications to every visit  (07/26/2010)     Eating: drink diet soda or water instead of juice or soda, eat more vegetables, use fresh or frozen vegetables, eat foods that are low in salt, eat baked  foods instead of fried foods, eat fruit for snacks and desserts, limit or avoid alcohol  (07/26/2010)     Activity: take a 30 minute walk every day  (07/26/2010)    Hypertension self-management support: Written self-care plan, Education handout, Pre-printed educational material, Resources for patients handout  (07/26/2010)   Hypertension self-care plan printed.   Hypertension education handout printed      Resource handout printed.    Medication Administration  Injection # 1:    Medication: Ketorolac-Toradol 15mg     Diagnosis: PAIN IN JOINT, SHOULDER REGION (ICD-719.41)    Route: IM    Site: R deltoid    Exp Date: 06/12/2011    Lot #: [id number]    Mfr: Novaplus    Comments: Given 60 mg IM    Patient tolerated injection without complications    Given by: Angelina Ok RN (July 26, 2010 11:52 AM)  Orders Added: 1)  Admin of Therapeutic Inj  intramuscular or subcutaneous [96372] 2)  Ketorolac-Toradol 15mg  [J1885] 3)  Est.  Patient Level IV [95621]

## 2010-09-12 NOTE — Progress Notes (Signed)
Summary: walk-in/gp  Phone Note Call from Patient   Summary of Call: Pt. is here at the clinic requesting somethting for eye pain; she was here last for right eye "stye". States she does not have any money. I talked to [place] at [facility] yesterday tof/u on Opthal. referral; she said she will look at it and call me  back today.  No appts. available today.  I will talk to the Attending. Initial call taken by: Chinita Pester RN,  September 03, 2010 2:04 PM  Follow-up for Phone Call        Pt. instructed to use warm compresses and Tylenol 500mg  given per Dr. Phillips Odor. Follow-up by: Chinita Pester RN,  September 03, 2010 2:18 PM  Additional Follow-up for Phone Call Additional follow up Details #1::        noted. Additional Follow-up by: Julaine Fusi  DO,  September 03, 2010 4:52 PM

## 2010-10-21 ENCOUNTER — Ambulatory Visit: Payer: Self-pay | Admitting: Internal Medicine

## 2010-10-22 LAB — DIFFERENTIAL
Basophils Absolute: 0 10*3/uL (ref 0.0–0.1)
Basophils Relative: 1 % (ref 0–1)
Eosinophils Absolute: 0 10*3/uL (ref 0.0–0.7)
Monocytes Absolute: 0.3 10*3/uL (ref 0.1–1.0)
Monocytes Relative: 6 % (ref 3–12)
Neutro Abs: 4.1 10*3/uL (ref 1.7–7.7)

## 2010-10-22 LAB — CBC
HCT: 45.3 % (ref 36.0–46.0)
Hemoglobin: 15.2 g/dL — ABNORMAL HIGH (ref 12.0–15.0)
MCH: 30.2 pg (ref 26.0–34.0)
MCHC: 33.6 g/dL (ref 30.0–36.0)
RDW: 16.4 % — ABNORMAL HIGH (ref 11.5–15.5)

## 2010-10-22 LAB — POCT I-STAT, CHEM 8
Calcium, Ion: 1.16 mmol/L (ref 1.12–1.32)
Glucose, Bld: 93 mg/dL (ref 70–99)
HCT: 51 % — ABNORMAL HIGH (ref 36.0–46.0)
Hemoglobin: 17.3 g/dL — ABNORMAL HIGH (ref 12.0–15.0)
TCO2: 25 mmol/L (ref 0–100)

## 2010-10-25 LAB — CBC
HCT: 42.5 % (ref 36.0–46.0)
MCV: 88.2 fL (ref 78.0–100.0)
Platelets: 212 10*3/uL (ref 150–400)

## 2010-10-25 LAB — URINALYSIS, ROUTINE W REFLEX MICROSCOPIC
Bilirubin Urine: NEGATIVE
Ketones, ur: NEGATIVE mg/dL
Nitrite: NEGATIVE
Protein, ur: NEGATIVE mg/dL
Urobilinogen, UA: 0.2 mg/dL (ref 0.0–1.0)

## 2010-10-25 LAB — DIFFERENTIAL
Basophils Absolute: 0 10*3/uL (ref 0.0–0.1)
Eosinophils Relative: 1 % (ref 0–5)
Lymphocytes Relative: 40 % (ref 12–46)
Neutro Abs: 2.3 10*3/uL (ref 1.7–7.7)
Neutrophils Relative %: 55 % (ref 43–77)

## 2010-10-25 LAB — COMPREHENSIVE METABOLIC PANEL
BUN: 9 mg/dL (ref 6–23)
CO2: 28 mEq/L (ref 19–32)
Chloride: 105 mEq/L (ref 96–112)
Creatinine, Ser: 0.91 mg/dL (ref 0.4–1.2)
GFR calc non Af Amer: >60 mL/min (ref >60)
Glucose, Bld: 96 mg/dL (ref 70–99)
Total Bilirubin: 0.4 mg/dL (ref 0.3–1.2)

## 2010-10-26 LAB — POCT URINALYSIS DIP (DEVICE)
Bilirubin Urine: NEGATIVE
Glucose, UA: NEGATIVE mg/dL
Ketones, ur: NEGATIVE mg/dL
Specific Gravity, Urine: 1.02 (ref 1.005–1.030)
Urobilinogen, UA: 0.2 mg/dL (ref 0.0–1.0)

## 2010-10-28 LAB — TYPE AND SCREEN
ABO/RH(D): O POS
Antibody Screen: NEGATIVE
Antibody Screen: NEGATIVE

## 2010-10-28 LAB — BASIC METABOLIC PANEL
CO2: 25 mEq/L (ref 19–32)
Calcium: 9.7 mg/dL (ref 8.4–10.5)
GFR calc Af Amer: >60 mL/min (ref >60)
GFR calc non Af Amer: >60 mL/min (ref >60)
Sodium: 137 mEq/L (ref 135–145)

## 2010-10-28 LAB — CBC
Hemoglobin: 14.4 g/dL (ref 12.0–15.0)
MCHC: 33.5 g/dL (ref 30.0–36.0)
MCHC: 33.7 g/dL (ref 30.0–36.0)
Platelets: 198 10*3/uL (ref 150–400)
RBC: 4.77 MIL/uL (ref 3.87–5.11)
RDW: 13.5 % (ref 11.5–15.5)

## 2010-10-29 LAB — CBC
HCT: 38.4 % (ref 36.0–46.0)
HCT: 42.8 % (ref 36.0–46.0)
Hemoglobin: 13 g/dL (ref 12.0–15.0)
MCHC: 33.9 g/dL (ref 30.0–36.0)
MCHC: 32.7 g/dL (ref 30.0–36.0)
Platelets: 209 10*3/uL (ref 150–400)
RDW: 13.9 % (ref 11.5–15.5)
RDW: 13.9 % (ref 11.5–15.5)

## 2010-10-29 LAB — DIFFERENTIAL
Basophils Absolute: 0.1 10*3/uL (ref 0.0–0.1)
Basophils Absolute: 0 10*3/uL (ref 0.0–0.1)
Basophils Relative: 2 % — ABNORMAL HIGH (ref 0–1)
Basophils Relative: 1 % (ref 0–1)
Eosinophils Relative: 1 % (ref 0–5)
Eosinophils Relative: 2 % (ref 0–5)
Lymphocytes Relative: 19 % (ref 12–46)
Lymphocytes Relative: 36 % (ref 12–46)
Monocytes Absolute: 0.2 10*3/uL (ref 0.1–1.0)
Monocytes Relative: 3 % (ref 3–12)

## 2010-10-29 LAB — GC/CHLAMYDIA PROBE AMP, URINE
Chlamydia, Swab/Urine, PCR: NEGATIVE
GC Probe Amp, Urine: NEGATIVE

## 2010-10-29 LAB — POCT I-STAT, CHEM 8
BUN: 10 mg/dL (ref 6–23)
Calcium, Ion: 1.22 mmol/L (ref 1.12–1.32)
HCT: 46 % (ref 36.0–46.0)
Hemoglobin: 15.6 g/dL — ABNORMAL HIGH (ref 12.0–15.0)
Sodium: 139 mEq/L (ref 135–145)
TCO2: 26 mmol/L (ref 0–100)

## 2010-10-29 LAB — URINALYSIS, ROUTINE W REFLEX MICROSCOPIC
Ketones, ur: NEGATIVE mg/dL
Nitrite: NEGATIVE
Protein, ur: NEGATIVE mg/dL
Urobilinogen, UA: 0.2 mg/dL (ref 0.0–1.0)

## 2010-10-29 LAB — URINE DRUGS OF ABUSE SCREEN W ALC, ROUTINE (REF LAB)
Amphetamine Screen, Ur: NEGATIVE
Cocaine Metabolites: NEGATIVE
Creatinine,U: 87.6 mg/dL
Opiate Screen, Urine: NEGATIVE

## 2010-10-29 LAB — URINE MICROSCOPIC-ADD ON

## 2010-11-20 LAB — CBC
HCT: 43.5 % (ref 36.0–46.0)
Hemoglobin: 14.7 g/dL (ref 12.0–15.0)
MCV: 89.7 fL (ref 78.0–100.0)
Platelets: 213 10*3/uL (ref 150–400)
RDW: 15.1 % (ref 11.5–15.5)
WBC: 4.3 10*3/uL (ref 4.0–10.5)

## 2010-11-20 LAB — POCT I-STAT, CHEM 8
BUN: 11 mg/dL (ref 6–23)
Chloride: 106 mEq/L (ref 96–112)
Creatinine, Ser: 1.3 mg/dL — ABNORMAL HIGH (ref 0.4–1.2)
Potassium: 4.2 mEq/L (ref 3.5–5.1)
Sodium: 139 mEq/L (ref 135–145)
TCO2: 25 mmol/L (ref 0–100)

## 2010-11-20 LAB — DIFFERENTIAL
Eosinophils Absolute: 0.1 10*3/uL (ref 0.0–0.7)
Eosinophils Relative: 2 % (ref 0–5)
Lymphocytes Relative: 35 % (ref 12–46)
Lymphs Abs: 1.5 10*3/uL (ref 0.7–4.0)
Monocytes Absolute: 0.2 10*3/uL (ref 0.1–1.0)

## 2010-11-20 LAB — URINALYSIS, ROUTINE W REFLEX MICROSCOPIC
Bilirubin Urine: NEGATIVE
Ketones, ur: NEGATIVE mg/dL
Leukocytes, UA: NEGATIVE
Nitrite: NEGATIVE
Protein, ur: NEGATIVE mg/dL
pH: 5.5 (ref 5.0–8.0)

## 2010-11-20 LAB — WET PREP, GENITAL

## 2010-11-20 LAB — URINE MICROSCOPIC-ADD ON

## 2010-11-20 LAB — GC/CHLAMYDIA PROBE AMP, GENITAL: Chlamydia, DNA Probe: NEGATIVE

## 2010-11-21 LAB — WET PREP, GENITAL: Yeast Wet Prep HPF POC: NONE SEEN

## 2010-11-21 LAB — URINALYSIS, ROUTINE W REFLEX MICROSCOPIC
Bilirubin Urine: NEGATIVE
Hgb urine dipstick: NEGATIVE
Ketones, ur: NEGATIVE mg/dL
Specific Gravity, Urine: 1.02 (ref 1.005–1.030)
Urobilinogen, UA: 1 mg/dL (ref 0.0–1.0)
pH: 6.5 (ref 5.0–8.0)

## 2010-11-21 LAB — GC/CHLAMYDIA PROBE AMP, GENITAL
Chlamydia, DNA Probe: NEGATIVE
GC Probe Amp, Genital: NEGATIVE

## 2010-12-01 ENCOUNTER — Encounter: Payer: Self-pay | Admitting: Internal Medicine

## 2010-12-02 ENCOUNTER — Other Ambulatory Visit: Payer: Self-pay | Admitting: Licensed Clinical Social Worker

## 2010-12-02 ENCOUNTER — Ambulatory Visit: Payer: Self-pay | Admitting: Licensed Clinical Social Worker

## 2010-12-02 DIAGNOSIS — F319 Bipolar disorder, unspecified: Secondary | ICD-10-CM

## 2010-12-02 NOTE — Progress Notes (Signed)
20 minutes.  Soc. Work.   Homeless.  Bipolar Disorder. Jennifer [name] has come in today looking for assistance regarding her Social Security denial.  She is also asking for money to get over to [employer] this afternoon.   I explained to [name] how to file for reconsideration and she is going to do so.   I also offered to write a letter of support on her behalf so she can get her disability.  [name] has been homeless and unemployed for the last several years and we have been encouraging mental health care and filing for disability.   I declined to give her money this morning as we are running low on funds and we have given her money, food cards, provisions over the last few months and are running very low on funds.    She completed her course of MH therapy at [facility] and has opted to continue therapy and psychiatry at [facility] according to the notes from [place].  Cont SW services with regard to her disability claim.

## 2010-12-05 ENCOUNTER — Ambulatory Visit (INDEPENDENT_AMBULATORY_CARE_PROVIDER_SITE_OTHER): Payer: Self-pay | Admitting: Internal Medicine

## 2010-12-05 ENCOUNTER — Encounter: Payer: Self-pay | Admitting: Internal Medicine

## 2010-12-05 VITALS — BP 120/85 | HR 62 | Temp 97.9°F | Ht 64.0 in | Wt 321.7 lb

## 2010-12-05 DIAGNOSIS — G4700 Insomnia, unspecified: Secondary | ICD-10-CM

## 2010-12-05 DIAGNOSIS — Z20828 Contact with and (suspected) exposure to other viral communicable diseases: Secondary | ICD-10-CM

## 2010-12-05 DIAGNOSIS — F3112 Bipolar disorder, current episode manic without psychotic features, moderate: Secondary | ICD-10-CM | POA: Insufficient documentation

## 2010-12-05 DIAGNOSIS — Z206 Contact with and (suspected) exposure to human immunodeficiency virus [HIV]: Secondary | ICD-10-CM | POA: Insufficient documentation

## 2010-12-05 DIAGNOSIS — I10 Essential (primary) hypertension: Secondary | ICD-10-CM

## 2010-12-05 MED ORDER — QUETIAPINE FUMARATE 50 MG PO TABS: 50.0000 mg | ORAL_TABLET | Freq: Every day | ORAL | Status: DC

## 2010-12-05 NOTE — Assessment & Plan Note (Addendum)
I would start her on Seroquel for right now. She should ideally be treated with lithium along with other meds, but this clinic does not have continued experience and ability to titrate her dose for her mental illness. I would refer her to the [facility]. She has acute mania with paranoia and bipolar disorder. Her mood and logic are unstable but she is not at risk of hurting herself.  Call [phone] would be preferred land line to call but this is her mothers phone. DO NOT PROVIDE any information to her mom per patient. Only give information to the patient.

## 2010-12-05 NOTE — Patient Instructions (Signed)
REturn in 5-7 days.

## 2010-12-06 ENCOUNTER — Other Ambulatory Visit: Payer: Self-pay | Admitting: Internal Medicine

## 2010-12-06 DIAGNOSIS — K089 Disorder of teeth and supporting structures, unspecified: Secondary | ICD-10-CM

## 2010-12-06 LAB — HIV ANTIBODY (ROUTINE TESTING W REFLEX): HIV: NONREACTIVE

## 2010-12-06 NOTE — Assessment & Plan Note (Signed)
This is related to her manic state. I hope that seroquel will help her sleep.

## 2010-12-06 NOTE — Assessment & Plan Note (Signed)
Well controlled. Continue current regimen. I wonder if clonidine is worsening her manic symptoms. Continue to monitor.

## 2010-12-06 NOTE — Assessment & Plan Note (Signed)
Patient reports that one of her partner was thought to be HIV positive. She is getting tested again in four months and its negative. She also has other concerns of cancer, which I think are related to her paranoid state of mind at this time. I will simply counsel and reassure.

## 2010-12-06 NOTE — Progress Notes (Signed)
  Subjective:    [name], female    DOB: 1961/07/02, 50 y.o.   MRN: [id number]  Headache   Shortness of Breath Associated symptoms include headaches.   50 years old female is in the clinic with the following complains. 1. Unable to sleep: Patient reports that sheHas not slept for last 4 days. Patient reports that she has constant thoughts going in her mind. This racing thoughts are not of one particular kind. She has taken over-the-counter sleeping pills but has not helped. Patient reports that she falls asleep for maybe half-an-hour to an hour and slowly wake up but she feel like she herself is shouting to wake herself up. Patient denies such episodes in the past. She also feels that somebody is talking her house. She has yet to find any physical evidence to substantiate such claim.  #2 anger, anxiety, emotional outburst-patient reports that she constantly feels that she is at an edge and very small incidence makes her very upset. She gives an example where she started getting upset and yelling at the mother in the bus was traversed crying. Her family member also things that she needs evaluation from psychiatrist because her head is not right.  #3 patient [facility]. She has been in counseling before and has been going to last for weeks without much benefit. She thinks that she needs something to calm herself down.   #4 she denies suicidal ideation. She continues to use Prozac.  #5 she also wants me to check her liver, heart, abdomen, and brain because some of her family member has been diagnosed with cancer and she wants every test to be done to make sure she doesn't have one. Review of Systems  Constitutional: Positive for activity change and appetite change.  Eyes: Negative.   Respiratory: Positive for shortness of breath.   Cardiovascular: Negative.   Gastrointestinal: Negative.   Musculoskeletal: Negative.   Skin:  Negative.   Neurological: Positive for headaches.  Psychiatric/Behavioral: Positive for behavioral problems, sleep disturbance, dysphoric mood, decreased concentration and agitation. Negative for suicidal ideas and hallucinations. The patient is nervous/anxious.        Objective:   Physical Exam  Constitutional: She is oriented to person, place, and time. She appears well-developed and well-nourished.  HENT:  Head: Normocephalic and atraumatic.  Right Ear: External ear normal.  Left Ear: External ear normal.  Eyes: Conjunctivae and EOM are normal. Pupils are equal, round, and reactive to light.  Neck: No JVD present. No tracheal deviation present. No thyromegaly present.  Cardiovascular: Normal rate, regular rhythm and normal heart sounds.  Exam reveals no gallop.   No murmur heard. Pulmonary/Chest: No respiratory distress. She has no wheezes. She has no rales. She exhibits no tenderness.  Abdominal: Soft. Bowel sounds are normal. She exhibits no distension and no mass. There is no tenderness. There is no rebound and no guarding.  Musculoskeletal: Normal range of motion. She exhibits no edema and no tenderness.  Lymphadenopathy:    She has no cervical adenopathy.  Neurological: She is alert and oriented to person, place, and time. She has normal reflexes. No cranial nerve deficit. Coordination normal.  Skin: No rash noted. No erythema.  Psychiatric: Her mood appears anxious. Her affect is labile. Her speech is rapid and/or pressured. She is agitated, is hyperactive and withdrawn. Thought content is paranoid. She expresses impulsivity.          Assessment & Plan:

## 2010-12-09 ENCOUNTER — Telehealth: Payer: Self-pay | Admitting: Licensed Clinical Social Worker

## 2010-12-09 NOTE — Telephone Encounter (Signed)
Called Mental Health access line regarding psychiatry and they said that case with [facility] was still open and that I should call there and they would have access to psychiatry.   Left message with Alona Bene, counseling manager at [facility] at [phone] to let her know that the patient was needing psychiatry per her bipolar diagnosis and related symptoms and also per Dr. Sherryll Burger request.

## 2010-12-13 ENCOUNTER — Encounter: Payer: Self-pay | Admitting: Internal Medicine

## 2010-12-16 ENCOUNTER — Emergency Department (HOSPITAL_COMMUNITY): Payer: Self-pay

## 2010-12-16 ENCOUNTER — Emergency Department (HOSPITAL_COMMUNITY)
Admission: EM | Admit: 2010-12-16 | Discharge: 2010-12-16 | Disposition: A | Discharge status: Home / Self Care | Payer: Self-pay | Attending: Emergency Medicine | Admitting: Emergency Medicine

## 2010-12-16 DIAGNOSIS — F319 Bipolar disorder, unspecified: Secondary | ICD-10-CM | POA: Insufficient documentation

## 2010-12-16 DIAGNOSIS — Z79899 Other long term (current) drug therapy: Secondary | ICD-10-CM | POA: Insufficient documentation

## 2010-12-16 DIAGNOSIS — I10 Essential (primary) hypertension: Secondary | ICD-10-CM | POA: Insufficient documentation

## 2010-12-16 DIAGNOSIS — R109 Unspecified abdominal pain: Secondary | ICD-10-CM | POA: Insufficient documentation

## 2010-12-16 DIAGNOSIS — R1900 Intra-abdominal and pelvic swelling, mass and lump, unspecified site: Secondary | ICD-10-CM | POA: Insufficient documentation

## 2010-12-16 LAB — COMPREHENSIVE METABOLIC PANEL
ALT: 18 U/L (ref 0–35)
Alkaline Phosphatase: 51 U/L (ref 39–117)
CO2: 23 mEq/L (ref 19–32)
Chloride: 103 mEq/L (ref 96–112)
GFR calc non Af Amer: >60 mL/min (ref >60)
Glucose, Bld: 99 mg/dL (ref 70–99)
Potassium: 3.8 mEq/L (ref 3.5–5.1)
Sodium: 135 mEq/L (ref 135–145)
Total Bilirubin: 0.2 mg/dL — ABNORMAL LOW (ref 0.3–1.2)
Total Protein: 6.9 g/dL (ref 6.0–8.3)

## 2010-12-16 LAB — URINALYSIS, ROUTINE W REFLEX MICROSCOPIC
Glucose, UA: NEGATIVE mg/dL
Nitrite: NEGATIVE
Specific Gravity, Urine: 1.013 (ref 1.005–1.030)
pH: 6.5 (ref 5.0–8.0)

## 2010-12-16 LAB — CBC
HCT: 43.2 % (ref 36.0–46.0)
Hemoglobin: 14.4 g/dL (ref 12.0–15.0)
RBC: 4.9 MIL/uL (ref 3.87–5.11)
WBC: 5.6 10*3/uL (ref 4.0–10.5)

## 2010-12-16 LAB — DIFFERENTIAL
Basophils Absolute: 0 10*3/uL (ref 0.0–0.1)
Lymphocytes Relative: 27 % (ref 12–46)
Lymphs Abs: 1.5 10*3/uL (ref 0.7–4.0)
Neutro Abs: 3.7 10*3/uL (ref 1.7–7.7)

## 2010-12-16 LAB — LIPASE, BLOOD: Lipase: 37 U/L (ref 11–59)

## 2010-12-16 MED ORDER — IOHEXOL 300 MG/ML  SOLN
125.0000 mL | Freq: Once | INTRAMUSCULAR | Status: AC | PRN
  Administered 2010-12-16: 125 mL via INTRAVENOUS

## 2010-12-16 NOTE — Telephone Encounter (Signed)
Left another message for Alona Bene

## 2010-12-17 ENCOUNTER — Other Ambulatory Visit: Payer: Self-pay | Admitting: *Deleted

## 2010-12-17 DIAGNOSIS — F3112 Bipolar disorder, current episode manic without psychotic features, moderate: Secondary | ICD-10-CM

## 2010-12-17 NOTE — Telephone Encounter (Signed)
Pt states she is doing very well on this med, wants refills

## 2010-12-18 ENCOUNTER — Telehealth: Payer: Self-pay | Admitting: Licensed Clinical Social Worker

## 2010-12-18 MED ORDER — QUETIAPINE FUMARATE 50 MG PO TABS: 50.0000 mg | ORAL_TABLET | Freq: Every day | ORAL | Status: DC

## 2010-12-18 NOTE — Telephone Encounter (Signed)
Spoke with [facility] regarding multiple issues today.   She called and said that the Seroquel was working well however she does not have any way to obtain the medication/MD gave her samples.   I've advised her to connect to Mental Health thru the Access line or see if [facility] can help with that and that may be best bet in getting that medication.  I told her that I have a call into [place] at [facility] asking her to connect with area MH but no call back so far. I advised her I could not make that appmt for her unfortunately as they need to hear from the client directly.   She was also concerned about labs and Inocencio Homes and I reviewed her labs and let her know that there was not any problem with her labs including HIV test.   Continued follow-up.

## 2010-12-20 NOTE — Telephone Encounter (Signed)
I tried calling pt to see if she has gone to Mental Health for f/u.  Not able to reach pt. It is unclear if Rx of seroquel is to be refilled.  Dr Aundria Rud will refill for 2 weeks only If pt has f/u with MH. Pt has talked to D [facility] and was advised to make appointment with mental health Or family services to get refill on seroquel. Will call pt again at end of day.

## 2010-12-24 NOTE — Group Therapy Note (Signed)
Jennifer [name], Jennifer [name]             ACCOUNT NO.:  [id number]   MEDICAL RECORD NO.:  [id number]          PATIENT TYPE:  WOC   LOCATION:  [facility]                   FACILITY:  [facility]   PHYSICIAN:  Argentina Donovan, MD        DATE OF BIRTH:  09-05-1960   DATE OF SERVICE:  11/30/2008                                  CLINIC NOTE   HISTORY OF PRESENT ILLNESS:  The patient is a 50 year old African  American female gravida 5, para 0-0-5-0 with a history of five abortions  who has a somewhat complicated history of the present illness.  She was  first seen in the emergency room in November in [place], [place] where they told her she had an ovarian cyst.  She went in to  [facility] later that month, had another ultrasound which confirmed  cystic masses in the pelvis.  Somehow, at that point, she was scheduled  for another 6-week follow-up which revealed an unchanged ultrasound and  was recently seen because of increasing pelvic pain several days ago in  the [facility].  Had another ultrasound which also revealed  what looks like hydrosalpinx bilaterally.  The patient had a  hysterectomy for 9-pound fibroid uterus in 2003 by Dr. Emmaline Life, and  she has been quite well up from that time as far as her pelvic pain, so  this problem started in November.  She also told me that she had a  history of endometriosis which may be partially what caused the  reactivation of pelvic problems.  Please see ultrasound exams.  The  patient, as far as allergies, she is allergic to ibuprofen.  She has  been taking no medication up until this time for hypertension, and she  takes little pain medication.  Her family history is noncontributory.   SOCIAL HISTORY:  She smokes and is trying to cut down.  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   PHYSICAL EXAMINATION:  HEENT:  Within normal limits.  NECK:  Supple.  Thyroid symmetrical, no masses.  She has a 7 x 4 x 4  soft tissue mass in the acromial area of the right shoulder which  appears to be a lipoma.  BREAST:  Symmetrical.  No dominant masses.  BACK:  Erect.  LUNGS:  Clear to auscultation and percussion.  HEART:  No murmur, normal sinus rhythm.  ABDOMEN:  Full, rotund, soft, tympanic with a large, subumbilical scar  extending from the symphysis pubis to the umbilicus from her previous  hysterectomy.  Abdomen is tender to deep palpation but without guarding  or rebound in the area of the left adnexa space.  No CVA tenderness.  External genitalia is normal.  BUS within normal limits.  Vagina is  status hysterectomy, but clean as well as rugated.  RECTAL:  No masses.  EXTREMITIES:  No edema.  No varicosities.  NEUROLOGIC:  DTRs within normal limits.  IMPRESSION:  Bilateral pelvic masses by ultrasound and chronic pelvic  pain.   PLAN:  To have the patient see one of the surgeons here around July, so  they can schedule her surgery when she gets out of school.  I would  suggest a bowel prep prior to laparotomy in this patient as her history  of large fibroid tumors along with endometriosis, possibly would make  surgery difficult.           ______________________________  Argentina Donovan, MD     PR/MEDQ  D:  11/30/2008  T:  11/30/2008  Job:  [id number]

## 2010-12-27 NOTE — H&P (Signed)
[name], [name]                       ACCOUNT NO.:  [id number]   MEDICAL RECORD NO.:  [id number]                   PATIENT TYPE:  INP   LOCATION:  9323                                 [facility]   PHYSICIAN:  Mary Sella. Orlene Erm, M.D.                 DATE OF BIRTH:  [birth year]   DATE OF ADMISSION:  04/13/2002  DATE OF DISCHARGE:  04/16/2002                                HISTORY & PHYSICAL   HISTORY OF PRESENT ILLNESS:  The patient is a 50 year old black female with  a long history of uterine fibroids with increasing abdominal pain, decrease  in appetite, and weight loss.  The patient has been well known to have  fibroids and been seen previously in the GYN Clinic for that.  In August  2002 she had uterine fibroids after a CT scan.  However, these were not  followed up with ultrasound.  She was referred to [facility], but did not  have appointment.  She states that she had been having very heavy bleeding  and passing large clots for several weeks.  She had been having quite  significant pain.  She was referred by the internal medicine clinic who  evaluated her in July and found her to have a hematocrit of 30.1 and  hemoglobin of 8.8.  At that time she had TSH checked that was normal.  She  presents today and she would like to have definitive surgery for these  fibroids.   PAST MEDICAL HISTORY:  Significant for a lump in the left breast and  personal history of bipolar disease, but not on any current medications.   PAST SURGICAL HISTORY:  None.   PAST GYNECOLOGIC HISTORY:  As above.   PAST OBSTETRIC HISTORY:  SAB x1 and EAB x3.   MEDICATIONS:  Iron sulfate, Darvocet, and Lexapro.   PHYSICAL EXAMINATION:  VITAL SIGNS:  Pulse 81, blood pressure 142/79, weight  162.3.  HEENT:  Normocephalic, atraumatic.  HEART:  Regular rate and rhythm without murmur, rub, or gallop.  LUNGS:  Clear to auscultation bilaterally.  ABDOMEN:  Soft and distended.  There is a large mass  palpable to  approximately 8 cm above the umbilicus.  She has exquisite tenderness over  what appears to be the fundus of her uterus.  EXTREMITIES:  Without clubbing, cyanosis, edema.  LYMPH:  There is no inguinal or axillary adenopathy.  There is no  supraclavicular adenopathy as well.  PELVIC:  EGBUS is normal.  Vagina is pink and rugated without lesions.  Cervix is without lesions and there is pooling of blood on the posterior  vagina.  Pap smear is obtained.  Endometrial biopsy has been obtained.  The  uterus is large and irregular and appears to be approximately 8 cm above the  umbilicus.  There are no separate adnexal masses that are palpable.   ASSESSMENT AND PLAN:  This is a 51 year old black female  with large uterine  fibroids.  She has had rapid growth of her fibroids since her last  examination in 2002.  The patient desires definitive surgery with  exploratory  laparotomy and total abdominal hysterectomy.  Discussed with patient risks  of surgery including bleeding, infection, injury to internal organs.  The  patient desires to retain her ovaries if they are normal.  The patient is  placed on oral contraceptives prior to surgery to slow down her bleeding  until surgery.                                               Mary Sella. Orlene Erm, M.D.    EMH/MEDQ  D:  05/13/2002  T:  05/13/2002  Job:  [id number]

## 2010-12-27 NOTE — Discharge Summary (Signed)
NAMESWEET, JARVIS             ACCOUNT NO.:  [id number]   MEDICAL RECORD NO.:  [id number]          PATIENT TYPE:  INP   LOCATION:  5504                         FACILITY:  [facility]   PHYSICIAN:  Lonia Blood, M.D.       DATE OF BIRTH:  12/07/1960   DATE OF ADMISSION:  11/06/2006  DATE OF DISCHARGE:  11/07/2006                               DISCHARGE SUMMARY   PRIMARY CARE PHYSICIAN:  Unassigned.   DISCHARGE DIAGNOSES:  1. Chest pain - No pulmonary emboli.  No myocardial infarction.  2. Cocaine abuse.  3. Hypertension.  4. Significant left ventricular hypertrophy.  5. Mild leukopenia with HIV test negative.  6. Alcohol abuse.  7. Tobacco abuse.  8. Status post total abdominal hysterectomy in 2003.   DISCHARGE MEDICATIONS:  1. Multivitamin 1 daily.  2. Lisinopril/HCTZ 10/25 one daily.   CONDITION ON DISCHARGE:  Patient was discharged in good condition.  At  the time of discharge she was with stable vital signs, afebrile.  She  was instructed to followup with [facility] and call [phone] to schedule an appointment.  She was also told that in case she  does not qualify financially at [facility] to try the  [facility].   CONSULTATION ON THIS ADMISSION:  No consultations were obtained.[name]   PROCEDURE:  1. Complete Myoview scan of the chest with intervenous contrast.      Negative for PE.  2. Stress test echocardiogram with findings of left ventricular      hypertrophy.  3. Abdominal ultrasound which was within normal limits.   HISTORY AND PHYSICAL ON ADMISSION:  Refer to the page done by Dr.  Chandra Batch November 06, 2006.   HOSPITAL COURSE:  1. Chest pain.  Mrs. Limes was admitted with an episode of right-      sided chest pain.  She was fully evaluated for the possibility of      myocardial infarction as well as pulmonary embolus.  She ruled out      for both.  In retrospect, given the patient's history, the most      likely  etiology of her chest pain is secondary to cocaine use and      development of irritation of the pleura secondary to crack.      Patient's chest pain responded to symptomatic treatment and by the      time of discharge she was chest pain free.  2. Left ventricular hypertrophy secondary to uncontrolle hypertension      and cocaine abuse.  Patient was started on      Lisinopril/HCTZ and educated about quitting cocaine and following      up with a primary care physician.  3. Mild leukopenia.  Patient was evaluated for HIV and she tested      negative.  Appropriate evaluation for leukopenia to be done in      outpatient setting.  If it is persistent after the patient's stops      alcohol and cocaine.      Lonia Blood, M.D.  Electronically Signed  SL/MEDQ  D:  11/07/2006  T:  11/07/2006  Job:  [id number]

## 2010-12-27 NOTE — H&P (Signed)
Jennifer [name], Jennifer [name]             ACCOUNT NO.:  [id number]   MEDICAL RECORD NO.:  [id number]          PATIENT TYPE:  INP   LOCATION:  0104                         FACILITY:  [facility]   PHYSICIAN:  Elliot Cousin, M.D.    DATE OF BIRTH:  Feb 20, 1961   DATE OF ADMISSION:  11/06/2006  DATE OF DISCHARGE:                              HISTORY & PHYSICAL   PRIMARY CARE PHYSICIAN:  The patient is unassigned.   CHIEF COMPLAINT:  Right-sided chest pain, right upper quadrant abdominal  pain, right arm numbness and pain.   HISTORY OF PRESENT ILLNESS:  The patient is a 50 year old woman with a  past medical history significant for tobacco, alcohol and cocaine abuse,  who presents to the emergency department with a chief complaint of right-  sided chest pain, right arm pain and numbness, and right upper quadrant  abdominal pain.  Her symptoms started approximately 1 month ago.  The  discomfort has been intermittent.  The pain in the right arm and right  chest is described as a pressure-like pain that is intermittently sharp.  The pain has been mild to moderate in intensity.  She has had associated  numbness and tingling in the right arm.  She denies neck pain.  She  denies heavy lifting, although she says that she frequently uses her  arms at work at the [employer].  The right lateral chest  pain radiates to the upper abdomen on the right; it is not associated  with nausea or vomiting.  She denies constipation or diarrhea.  No  history of black tarry stools or bright red blood per rectum.  At times,  however, the pain has been pleuritic, but mostly it is not pleuritic.  She has had occasional racing of her heart and some shortness of breath.  She has not had any swelling in her legs.   The patient readily admits to crack cocaine abuse, alcohol abuse, and  tobacco use.   During the evaluation in the emergency department, the patient is noted  to be hemodynamically stable, although her  blood pressure was moderately  elevated at 155/96.  An ultrasound of the abdomen was ordered by the  emergency department physician.  The results were negative.  Her EKG,  however, reveals diffuse abnormalities including T-wave abnormalities,  especially T-wave inversions in all of the leads, possible left atrial  enlargement and an incomplete right bundle branch block.  Her initial  cardiac markers are negative; however, the patient will be admitted for  further evaluation and management.   PAST MEDICAL HISTORY:  1. Uterine fibroids, status post total abdominal hysterectomy in      September 2003.  2. Polysubstance abuse with cocaine, alcohol and tobacco.  The patient      has a history of self-detoxification for approximately 2 months in      2007.  She has no history of withdrawal symptoms or delirium      tremens.   MEDICATIONS:  None.   ALLERGIES:  The patient has an allergy to IBUPROFEN, which causes hives  and her tongue to swell.   SOCIAL  HISTORY:  The patient is divorced.  [place]  She is employed at a [employer].  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  She does not have any children.  She smokes a half a pack of cigarettes  per day.  She smokes cocaine approximately twice per week.  She drinks 1  fifth of brandy per week and approximately two 6-packs of beer per week.   FAMILY HISTORY:  Her father is 64 years of age and has diabetes  mellitus; he is also HIV-positive.  She has a sister who has  hypertension.  Her mother also has hypertension.   REVIEW OF SYSTEMS:  The patient's review of systems is positive as above  in the history of present illness.  In addition, she says that sometimes  the areas on her forehead, particularly over the bilateral temples,  swell.  She also says that she has bad teeth.   PHYSICAL EXAMINATION:  VITAL SIGNS:  Temperature 97.9, blood pressure  155/96,  repeated at 125/85, pulse 62, respiratory rate 20, oxygen  saturation 98% on 2 L of nasal cannula oxygen.  GENERAL:  The patient is a pleasant 50 year old African American woman  who is currently asleep, but arousable and awake after being aroused.  HEENT:  Head is normocephalic and nontraumatic.  Pupils are equal, round  and reactive to light.  Extraocular movements are intact.  Conjunctivae  are mildly injected.  Sclerae are muddy.  Tympanic membranes are clear  bilaterally.  Nasal mucosa is dry.  No sinus tenderness.  Oropharynx  reveals mildly dry mucous membranes.  No posterior exudates or erythema.  Her teeth are in poor repair.  There are multiple missing teeth with a  few cavities noted.  NECK:  Supple.  No adenopathy, no thyromegaly, no bruit, no JVD.  LUNGS:  Decreased breath sounds in the bases, otherwise clear.  HEART:  S1 and S2 with a soft systolic murmur.  ABDOMEN:  Well-healed hypogastric scar.  Mildly obese.  Positive bowel  sounds.  Soft.  Mildly tender in the epigastrium and the right upper  quadrant, without hepatosplenomegaly, without distention, rebound or  guarding.  RECTAL AND GU:  Deferred.  EXTREMITIES:  Pedal pulses are 2+ bilaterally.  No pretibial edema and  no pedal edema.  No unilateral calf tenderness or erythema.  MUSCULOSKELETAL:  The patient has mild to moderate tenderness over the  rotator cuff  and underneath the posterior scapula on the right.  No  appreciable erythema or edema.  There is also some mild tenderness over  the lateral aspect of the right chest wall.  There is a mild pleuritic  component.  She has good range of motion of both upper extremities.  NEUROLOGIC:  The patient is alert and oriented x3.  Cranial nerves II-  XII are intact.  Strength is 5/5 throughout.  Sensation is intact,  particularly over the right palmar and volar surfaces on the right arm.  ADMISSION LABORATORY DATA:  EKG results are above.   Chest x-ray reveals no acute  disease.   Urine drug screen positive for cocaine.  CK 288, CK-MB 3.1, troponin I  0.03.  WBC 3.9, hemoglobin 14.4, platelets 180,000.  Sodium 139,  potassium 4.1, chloride 107, CO2 25, glucose 101, BUN 8, creatinine  0.86, calcium 9.4, total protein 6.6, albumin 3.6, AST 25, ALT 21,  lipase 28.   ASSESSMENT:  1. Right-sided chest pain.  More than likely, the patient's chest pain      is musculoskeletal in origin, given the nature of her occupation.      However, given the EKG changes and a mild pleuritic component of      the pain, myocardial infarction and pulmonary embolus will need to      be ruled out.  2. Right arm numbness and tingling/paresthesias.  The patient may have      a radiculopathy secondary to frequent movement of the right arm.      She has no neck pain or complaints of neck discomfort.  She is      mildly to moderately tender over the rotator cuff, however.  3. Right upper quadrant pain.  This pain is probably referred from the      right chest wall and arm.  Ultrasound of the abdomen was completely      negative.  Her LFTs and lipase were both within normal limits.  4. Abnormal EKG with diffuse T-wave inversions, incomplete right      bundle branch block, and possible left atrial enlargement.  The      patient's cardiac enzymes are negative so far; however, the patient      may have some structural damage from chronic cocaine and alcohol      abuse.  5. Polysubstance abuse.  The patient readily admits abusing cocaine,      alcohol and tobacco.  Interestingly enough, the patient went      through a self-detoxification for 2 months; however, she relapsed      when she moved back home and began to experience stressors in her      life which are primarily related to family relations.  She has no      history of alcohol withdrawal.  6. Mild leukopenia.  The patient's WBC is 3.9.   PLAN:  1. The patient will be admitted for further evaluation and management.  2. We  will check a full set of cardiac enzymes.  3. We will also check a 2-D echocardiogram to evaluate for      cardiomyopathy and/or valvular abnormalities.  4. We will assess the patient's TSH and fasting lipid panel.  5. We will check a CT scan of the chest to rule out PE.  6. Consider Cardiology consultation.  7. Substance abuse counseling.  8. We will place a nicotine patch.  9. We will start vitamin therapy.  10.We will treat the patient with as-needed Ativan.  11.We will check  for HIV; the patient agrees.      Elliot Cousin, M.D.  Electronically Signed     DF/MEDQ  D:  11/06/2006  T:  11/06/2006  Job:  [id number]

## 2010-12-27 NOTE — Discharge Summary (Signed)
   Jennifer [name], Jennifer [name]                       ACCOUNT NO.:  [id number]   MEDICAL RECORD NO.:  [id number]                   PATIENT TYPE:  INP   LOCATION:  9323                                 [facility]ILITY:  [facility]   PHYSICIAN:  Mary Sella. Orlene Erm, M.D.                 DATE OF BIRTH:  04/23/1961   DATE OF ADMISSION:  04/13/2002  DATE OF DISCHARGE:  04/16/2002                                 DISCHARGE SUMMARY   REASON FOR ADMISSION:  Symptomatic uterine leiomyomata.   HISTORY:  The patient is a 50 year old black female who has a long history  of symptomatic uterine fibroids.  She had noted increase in pain and  abdominal pressure as well as heavy vaginal bleeding.  The patient desired  definitive therapy with total abdominal hysterectomy. The patient was  counseled prior to surgery in risks of bleeding, infection, injury to  adjacent organs.  The patient was also counseled that she would become  infertile with the hysterectomy.  The patient was admitted on 04/13/02 and  underwent total abdominal hysterectomy.  The patient's findings were  consistent with a large approximately 3900 gram uterus.  There was 350 cc  estimated blood loss intraoperatively.  Ther were no complications. The  patient returned to the recovery room in stable condition.   HOSPITAL COURSE:  On postoperative day #1 she had positive bowel sounds. The  wound was clean, dry and intact.  Hemoglobin was stable from prior  postoperative surgery.  Her diet was advanced. Postoperative day #2 the  patient did well with Percocet for pain. She had some moderate abdominal  distension and was tolerating p.o. okay but still had some slight nausea.  The patient was ambulating. On postoperative day #3 the patient was  tolerating a regular diet.  She had positive flatus and bowel movement.  She  had no nausea or vomiting and was tolerating diet well.  She was afebrile.  Vital signs were stable.  Her abdomen was mildly distended.  Staples  were  removed.  Steri-Strips were placed.  She was discharged home on  postoperative day #3 to follow up in approximately six weeks. Precautions  were given.   DISCHARGE MEDICATIONS:  Percocet and Naprosyn.   DISCHARGE ACTIVITIES:  No heavy lifting.   DIET:  Without restrictions.   Wound care was to keep it clean and dry. She is to follow up in four to six  weeks in the GYN clinic with Dr. Orlene Erm.                                               Mary Sella. Orlene Erm, M.D.    EMH/MEDQ  D:  05/03/2002  T:  05/03/2002  Job:  [id number]

## 2010-12-27 NOTE — Op Note (Signed)
Jennifer [name], Jennifer [name]                       ACCOUNT NO.:  [id number]   MEDICAL RECORD NO.:  [id number]                   PATIENT TYPE:  INP   LOCATION:  9323                                 [facility]   PHYSICIAN:  Mary Sella. Orlene Erm, M.D.                 DATE OF BIRTH:  Aug 04, 1961   DATE OF PROCEDURE:  04/13/2002  DATE OF DISCHARGE:  04/16/2002                                 OPERATIVE REPORT   PREOPERATIVE DIAGNOSIS:  Symptomatic uterine fibroids.   POSTOPERATIVE DIAGNOSIS:  Symptomatic uterine fibroids.   PROCEDURE:  Total abdominal hysterectomy.   SURGEON:  Mary Sella. Orlene Erm, M.D. and Michele Mcalpine D. Okey Dupre, M.D.   ANESTHESIA:  General endotracheal.   COMPLICATIONS:  None.   ESTIMATED BLOOD LOSS:  250 cc.   SPECIMENS:  Uterus.   FINDINGS:  Very large, approximately 3900-gram uterus with multiple uterine  leiomyomata.  Normal gallbladder, liver edge.  No adenopathy palpable.  Normal tubes and ovaries bilaterally.   INDICATIONS FOR PROCEDURE:  The patient is a 50 year old black female who  presented to the GYN clinic with a history of severe abdominal pain and  heavy vaginal bleeding.  This had been going for quite a number of years.  The patient was known to have large uterine leiomyomata.  She was counseled  on her options for therapy and desired total abdominal hysterectomy.  The  patient was counseled that this would make her infertile prior to the  procedure.  The patient was consented for surgery including the risk of  bleeding, infection, injury to internal organs.  The patient understands the  risk of transfusion.   PROCEDURE:  The patient was then taken to the operating room where she was  placed under general endotracheal anesthesia.  She was prepped and draped in  sterile fashion.  A midline vertical incision was performed and extended to  just above the umbilicus with a scalpel.  The subcutaneous tissues were  dissected with Bovie.  The fascia was entered and  dissected both superiorly  and inferiorly.  The rectus muscles bellies were separated and the  underlying peritoneum was grasped with a hemostat.  This was entered  sharply.  The peritoneum was dissected superiorly and inferiorly.  A Balfour  retractor was placed in the abdominal wall to obtain visualization.  The  bowel was packed away with moist lap sponges.  The patient's uterus was then  externalized and the patient's round ligaments were visualized bilaterally  and ligated with 0 Vicryl sutures and transected.  The broad ligament was  dissected anteriorly to the anterior aspect of the uterus.  The bladder was  separated away from the uterus.  The patient's utero-ovarian pedicles were  identified and the ureter was going to be out of the operative field and  these were clamped bilaterally.  These were transected and doubly ligated  with 0 Vicryl sutures.  The uterine arteries were then  skeletonized and  clamped at the level of the internal os.  These were transected and ligated  with 0 Vicryl sutures.  Alternating bites were taken along the uterine  corpus and cervix with straight Haney clamps.  These were transected and  ligated with 0 Vicryl sutures.  Once the uterine arteries were completely  ligated, the decision was made to perform a supracervical hysterectomy and  to obtain better visualization of the pelvis.  The corpus of the uterus was  excised with a scalpel blade and handed off for pathology.  The patient's  cervix was then grasped with a Kocher clamp.  The bladder was pushed further  down off the cervix.  The alternating bites were then taken with straight  Haney clamps.  These were transected and ligated with 0 Vicryl sutures.  The  curved Haney clamps were placed across the uterosacral ligaments and the  vagina was entered as the uterosacral ligaments were transected.  The  remaining cervix was excised with scissors.  Kocher clamps were used to  maintain the vaginal  mucosa.  The vaginal cuff was then closed with multiple  interrupted sutures of 0 Vicryl.  The pelvis was irrigated with copious  amounts of saline.  All pedicles were inspected and noted to be hemostatic.  The retractor was removed from the abdomen and the bowel packs were removed.  Palpation of the pelvis and aorta revealed no evidence of any adenopathy.  The liver edge and gallbladder were palpated and noted to be normal.  The  fascia was then closed with a running suture of double-stranded PDS. The  subcutaneous tissues were irrigated with copious amounts of saline and the  skin edges were reapproximated with staples.  The patient tolerated the  procedure well and returned to the recovery room in stable condition.  Sponge, instrument, and needle counts were correct at the end of the  procedure.                                                Mary Sella. Orlene Erm, M.D.    EMH/MEDQ  D:  05/03/2002  T:  05/03/2002  Job:  [id number]

## 2011-01-07 ENCOUNTER — Encounter: Payer: Self-pay | Admitting: Licensed Clinical Social Worker

## 2011-01-13 ENCOUNTER — Ambulatory Visit: Payer: Self-pay | Admitting: Obstetrics & Gynecology

## 2011-01-14 ENCOUNTER — Other Ambulatory Visit: Payer: Self-pay | Admitting: *Deleted

## 2011-01-14 DIAGNOSIS — F3112 Bipolar disorder, current episode manic without psychotic features, moderate: Secondary | ICD-10-CM

## 2011-01-14 MED ORDER — FLUOXETINE HCL 20 MG PO TABS: 20.0000 mg | ORAL_TABLET | Freq: Every day | ORAL | Status: DC

## 2011-01-14 MED ORDER — HYDROCHLOROTHIAZIDE 25 MG PO TABS: 25.0000 mg | ORAL_TABLET | Freq: Every day | ORAL | Status: DC

## 2011-01-14 MED ORDER — ZOLPIDEM TARTRATE 10 MG PO TABS: 10.0000 mg | ORAL_TABLET | Freq: Every day | ORAL | Status: DC

## 2011-01-14 MED ORDER — CELECOXIB 200 MG PO CAPS: 200.0000 mg | ORAL_CAPSULE | Freq: Every day | ORAL | Status: DC

## 2011-01-14 MED ORDER — CLONIDINE HCL 0.2 MG PO TABS: 0.2000 mg | ORAL_TABLET | Freq: Every day | ORAL | Status: DC

## 2011-01-14 MED ORDER — QUETIAPINE FUMARATE 50 MG PO TABS: 50.0000 mg | ORAL_TABLET | Freq: Every day | ORAL | Status: DC

## 2011-01-14 MED ORDER — ATENOLOL 50 MG PO TABS: 50.0000 mg | ORAL_TABLET | Freq: Every day | ORAL | Status: DC

## 2011-02-06 ENCOUNTER — Other Ambulatory Visit: Payer: Self-pay | Admitting: Obstetrics & Gynecology

## 2011-02-06 ENCOUNTER — Ambulatory Visit: Payer: Self-pay | Admitting: Obstetrics & Gynecology

## 2011-02-06 DIAGNOSIS — R635 Abnormal weight gain: Secondary | ICD-10-CM

## 2011-02-06 DIAGNOSIS — N764 Abscess of vulva: Secondary | ICD-10-CM

## 2011-02-06 DIAGNOSIS — R1900 Intra-abdominal and pelvic swelling, mass and lump, unspecified site: Secondary | ICD-10-CM

## 2011-02-06 DIAGNOSIS — N83209 Unspecified ovarian cyst, unspecified side: Secondary | ICD-10-CM

## 2011-02-06 DIAGNOSIS — R1011 Right upper quadrant pain: Secondary | ICD-10-CM

## 2011-02-07 NOTE — Group Therapy Note (Signed)
NAMEWILHELMENA, ZEA             ACCOUNT NO.:  [id number]  MEDICAL RECORD NO.:  [id number]           PATIENT TYPE:  A  LOCATION:  [facility]                   FACILITY:  [facility]  PHYSICIAN:  Elsie Lincoln, MD      DATE OF BIRTH:  1960-09-26  DATE OF SERVICE:  02/06/2011                                 CLINIC NOTE  The patient is a 50 year old, G5, para 0-0-5-0 who presents for followup of pelvic cyst found on CT approximately a month and half ago.  The patient went to the ER for abdominal pain and during the abdominal/pelvic CT they found a 4-cm pelvic mass most consistent with ovarian low density mass or cyst.  They were still thinking that she had her ovaries in situ, so they noted that the pelvic mass had decreased in size from the prior study.  I did check the patient's pathology.  She did have a BSO.  It did not seem that any ovarian stroma was left behind based on the operative report and the pathology report.  I did not do the surgery, perhaps Dr. Debroah Loop could remember this patient and remember if there was any ovarian stroma left behind.  There were some adhesions, but these were not noted to be marked.  The patient is complaining of weight gain approximately 15 pounds in the past year.  She is also complaining of a knot in her left vulva and she is complaining of right upper quadrant pain.  She was noted to have a distal esophageal stricture with a dilated esophagus on the CT that has not been worked up and the patient is unaware of the results.  She does have problems swallowing.  This is a patient of [facility].  We are going to refer her back there for the gastrointestinal concerns.  PAST MEDICAL HISTORY:  Breast microcalcifications status post needle biopsy, fibroids, endometriosis, alcoholism, and goes to AA, hypertension, supposedly enlarged heart from her alcoholism.  I have not seen any correspondence that she has any major heart/cardiac condition and I  told her to follow up with her primary care doctor.  PAST SURGICAL HISTORY:  TAH and then exploratory laparotomy with BSO and lysis of adhesions.  SOCIAL HISTORY:  She does have a history of alcoholism.  No history of abuse, smoking, or drugs.  FAMILY HISTORY:  Positive for high blood pressure.  No history of breast, colon, ovarian, or uterine cancer.  REVIEW OF SYSTEMS:  System review is positive with weakness, fatigue, fever, hot flashes, depression, headache, chest pain, discomfort, palpitations, leg cramps, easy bruising, muscle/joint pain, abdominal pain, vaginal itching, and frequent urination.  MEDICATIONS:  Tylenol, hydrochlorothiazide, Prozac, atenolol, clonidine, and Seroquel.  PHYSICAL EXAMINATION:  VITAL SIGNS:  Temperature 99.1, pulse 66, blood pressure 123/82, weight 230, and height 64 inches. GENERAL:  Well nourished, well developed, in no apparent distress. HEENT:  Normocephalic and atraumatic. ABDOMEN:  Obese.  Mild tenderness in the right upper quadrant.  No tenderness in lower quadrants.  No rebound or guarding.  Well-healed midline incision. GENITALIA:  Tanner Legrand Como, looks to be resolving.  In fact, I see hair follicle on the  left lower labia, majora vagina, thick creamy white discharge.  Vaginal vault intact.  No cystocele, no rectocele.  Bimanual exam revealed no masses and completely nontender. EXTREMITIES:  Nontender.  ASSESSMENT/PLAN:  A 50 year old female for followup of 4-cm cyst found on CT. 1. We will do transvaginal ultrasound because this is a much better     modality to evaluate the pelvic organs. 2. CA-125. 3. Check TSH for lethargy and weight gain. 4. Right upper quadrant ultrasound for pain. 5. Referral back to [facility] for dilated esophagus and     possible stricture. 6. The patient is encouraged to remove Kool-Aid tea, __________ and     other fatty foods from her diet to help with weight loss. 7. Exercise should be  cleared with her internal medicine doctor if she     does have an enlarged heart.          ______________________________ Elsie Lincoln, MD    KL/MEDQ  D:  02/06/2011  T:  02/07/2011  Job:  [id number]

## 2011-02-13 ENCOUNTER — Ambulatory Visit (HOSPITAL_COMMUNITY)
Admission: RE | Admit: 2011-02-13 | Discharge: 2011-02-13 | Disposition: A | Discharge status: Home / Self Care | Payer: Self-pay | Source: Ambulatory Visit | Attending: Obstetrics & Gynecology | Admitting: Obstetrics & Gynecology

## 2011-02-13 ENCOUNTER — Other Ambulatory Visit (HOSPITAL_COMMUNITY): Payer: Self-pay

## 2011-02-13 DIAGNOSIS — N9489 Other specified conditions associated with female genital organs and menstrual cycle: Secondary | ICD-10-CM | POA: Insufficient documentation

## 2011-02-13 DIAGNOSIS — N83209 Unspecified ovarian cyst, unspecified side: Secondary | ICD-10-CM

## 2011-02-13 DIAGNOSIS — R109 Unspecified abdominal pain: Secondary | ICD-10-CM | POA: Insufficient documentation

## 2011-02-13 DIAGNOSIS — Z90710 Acquired absence of both cervix and uterus: Secondary | ICD-10-CM | POA: Insufficient documentation

## 2011-02-13 DIAGNOSIS — R1011 Right upper quadrant pain: Secondary | ICD-10-CM

## 2011-02-14 ENCOUNTER — Encounter: Payer: Self-pay | Admitting: Internal Medicine

## 2011-03-03 ENCOUNTER — Encounter (HOSPITAL_COMMUNITY): Payer: Self-pay | Admitting: *Deleted

## 2011-03-03 ENCOUNTER — Inpatient Hospital Stay (HOSPITAL_COMMUNITY)
Admission: AD | Admit: 2011-03-03 | Discharge: 2011-03-03 | Disposition: A | Discharge status: Home / Self Care | Payer: Self-pay | Source: Ambulatory Visit | Attending: Obstetrics & Gynecology | Admitting: Obstetrics & Gynecology

## 2011-03-03 ENCOUNTER — Inpatient Hospital Stay (HOSPITAL_COMMUNITY): Payer: Self-pay

## 2011-03-03 DIAGNOSIS — R102 Pelvic and perineal pain: Secondary | ICD-10-CM

## 2011-03-03 DIAGNOSIS — A599 Trichomoniasis, unspecified: Secondary | ICD-10-CM | POA: Insufficient documentation

## 2011-03-03 DIAGNOSIS — N949 Unspecified condition associated with female genital organs and menstrual cycle: Secondary | ICD-10-CM | POA: Insufficient documentation

## 2011-03-03 HISTORY — DX: Major depressive disorder, single episode, unspecified: F32.9

## 2011-03-03 HISTORY — DX: Essential (primary) hypertension: I10

## 2011-03-03 HISTORY — DX: Depression, unspecified: F32.A

## 2011-03-03 HISTORY — DX: Mental disorder, not otherwise specified: F99

## 2011-03-03 LAB — URINALYSIS, ROUTINE W REFLEX MICROSCOPIC
Glucose, UA: NEGATIVE mg/dL
Specific Gravity, Urine: >1.03 — ABNORMAL HIGH (ref 1.005–1.030)
Urobilinogen, UA: 0.2 mg/dL (ref 0.0–1.0)

## 2011-03-03 LAB — RAPID URINE DRUG SCREEN, HOSP PERFORMED
Opiates: POSITIVE — AB
Tetrahydrocannabinol: NOT DETECTED

## 2011-03-03 LAB — COMPREHENSIVE METABOLIC PANEL
ALT: 21 U/L (ref 0–35)
Alkaline Phosphatase: 53 U/L (ref 39–117)
CO2: 27 mEq/L (ref 19–32)
Calcium: 10.1 mg/dL (ref 8.4–10.5)
Chloride: 102 mEq/L (ref 96–112)
GFR calc Af Amer: >60 mL/min (ref >60)
GFR calc non Af Amer: >60 mL/min (ref >60)
Glucose, Bld: 99 mg/dL (ref 70–99)
Sodium: 137 mEq/L (ref 135–145)
Total Bilirubin: 0.2 mg/dL — ABNORMAL LOW (ref 0.3–1.2)

## 2011-03-03 LAB — URINE MICROSCOPIC-ADD ON

## 2011-03-03 LAB — CBC
Hemoglobin: 14.2 g/dL (ref 12.0–15.0)
MCH: 29.2 pg (ref 26.0–34.0)
RBC: 4.86 MIL/uL (ref 3.87–5.11)

## 2011-03-03 LAB — WET PREP, GENITAL: Yeast Wet Prep HPF POC: NONE SEEN

## 2011-03-03 MED ORDER — METRONIDAZOLE 500 MG PO TABS
2000.0000 mg | ORAL_TABLET | Freq: Once | ORAL | Status: AC
  Administered 2011-03-03: 2000 mg via ORAL
  Filled 2011-03-03: qty 4

## 2011-03-03 MED ORDER — HYDROMORPHONE HCL 1 MG/ML IJ SOLN
1.0000 mg | INTRAMUSCULAR | Status: DC | PRN
  Administered 2011-03-03: 1 mg via INTRAMUSCULAR
  Filled 2011-03-03: qty 1

## 2011-03-03 MED ORDER — OXYCODONE-ACETAMINOPHEN 5-325 MG PO TABS
2.0000 | ORAL_TABLET | Freq: Once | ORAL | Status: AC
  Administered 2011-03-03: 2 via ORAL
  Filled 2011-03-03: qty 2

## 2011-03-03 MED ORDER — LORAZEPAM 2 MG/ML IJ SOLN
2.0000 mg | Freq: Once | INTRAMUSCULAR | Status: AC
  Administered 2011-03-03: 2 mg via INTRAMUSCULAR
  Filled 2011-03-03: qty 1

## 2011-03-03 MED ORDER — OXYCODONE-ACETAMINOPHEN 10-650 MG PO TABS: 1.0000 | ORAL_TABLET | Freq: Four times a day (QID) | ORAL | Status: DC | PRN

## 2011-03-03 NOTE — ED Notes (Signed)
Pt to XR by WC.

## 2011-03-03 NOTE — Progress Notes (Signed)
Lower abd pain, radiates into back - was seen in Gyn clinic 2 weeks ago & had U/S & XR.  Pain started 2 months ago & has become increasingly severe.  Pt C/O extreme pelvic pressure, "feels like something's about to come out."  States BM's are extremely painful.

## 2011-03-03 NOTE — ED Provider Notes (Signed)
History   Chief Complaint:  Abdominal Pain   Jennifer [name] is  50 y.o. [id number] Patient's last menstrual period was 12/04/2009.Jennifer [name] Patient is here for pelvic pain that started yesterday that she rates 10/10. She is s/p total hysterectomy and BSO. Reports recent pelvic US and CT showing mass. Reports last BM yesterday  General ROS:  positive for pelvic pain, denies vaginal blding, discharge, dysuria, or pain with intercourse.   Physical Exam   Blood pressure 137/96, pulse 57, temperature 98.9 F (37.2 C), temperature source Oral, resp. rate 22, height 5' 3.5" (1.613 m), weight 225 lb (102.059 kg), last menstrual period 12/04/2009. General: Anxious, Argumentative Abd: obese, diffusely tender, no masses, + BS x 4 Focused Gynecological Exam: normal external genitalia, , exam limited by pt habitus, no palpable internal organs, exam chaperoned by Darel Hong, RN. Cuff intact.  Labs: Recent Results (from the past 24 hour(s))  URINALYSIS, ROUTINE W REFLEX MICROSCOPIC   Collection Time   03/03/11  3:20 PM      Component Value Range   Color, Urine YELLOW  YELLOW    Appearance HAZY (*) CLEAR    Specific Gravity, Urine >1.030 (*) 1.005 - 1.030    pH 6.0  5.0 - 8.0    Glucose, UA NEGATIVE  NEGATIVE (mg/dL)   Hgb urine dipstick TRACE (*) NEGATIVE    Bilirubin Urine NEGATIVE  NEGATIVE    Ketones, ur NEGATIVE  NEGATIVE (mg/dL)   Protein, ur NEGATIVE  NEGATIVE (mg/dL)   Urobilinogen, UA 0.2  0.0 - 1.0 (mg/dL)   Nitrite NEGATIVE  NEGATIVE    Leukocytes, UA TRACE (*) NEGATIVE   URINE MICROSCOPIC-ADD ON   Collection Time   03/03/11  3:20 PM      Component Value Range   Squamous Epithelial / LPF MANY (*) RARE    WBC, UA 3-6  <3 (WBC/hpf)   Bacteria, UA FEW (*) RARE    Urine-Other TRICHOMONAS PRESENT    DRUG SCREEN PANEL, EMERGENCY   Collection Time   03/03/11  3:20 PM      Component Value Range   Opiates POSITIVE (*) NONE DETECTED    Cocaine NONE DETECTED  NONE DETECTED    Benzodiazepines NONE  DETECTED  NONE DETECTED    Amphetamines NONE DETECTED  NONE DETECTED    Tetrahydrocannabinol NONE DETECTED  NONE DETECTED    Barbiturates NONE DETECTED  NONE DETECTED   CBC   Collection Time   03/03/11  4:41 PM      Component Value Range   WBC 5.2  4.0 - 10.5 (K/uL)   RBC 4.86  3.87 - 5.11 (MIL/uL)   Hemoglobin 14.2  12.0 - 15.0 (g/dL)   HCT 29.5  62.1 - 30.8 (%)   MCV 87.7  78.0 - 100.0 (fL)   MCH 29.2  26.0 - 34.0 (pg)   MCHC 33.3  30.0 - 36.0 (g/dL)   RDW 65.7  84.6 - 96.2 (%)   Platelets 204  150 - 400 (K/uL)  COMPREHENSIVE METABOLIC PANEL   Collection Time   03/03/11  4:41 PM      Component Value Range   Sodium 137  135 - 145 (mEq/L)   Potassium 4.0  3.5 - 5.1 (mEq/L)   Chloride 102  96 - 112 (mEq/L)   CO2 27  19 - 32 (mEq/L)   Glucose, Bld 99  70 - 99 (mg/dL)   BUN 14  6 - 23 (mg/dL)   Creatinine, Ser 9.52  0.50 - 1.10 (mg/dL)  Calcium 10.1  8.4 - 10.5 (mg/dL)   Total Protein 8.0  6.0 - 8.3 (g/dL)   Albumin 3.7  3.5 - 5.2 (g/dL)   AST 17  0 - 37 (U/L)   ALT 21  0 - 35 (U/L)   Alkaline Phosphatase 53  39 - 117 (U/L)   Total Bilirubin 0.2 (*) 0.3 - 1.2 (mg/dL)   GFR calc non Af Amer >60  >60 (mL/min)   GFR calc Af Amer >60  >60 (mL/min)  AMYLASE   Collection Time   03/03/11  4:41 PM      Component Value Range   Amylase 81  0 - 105 (U/L)  LIPASE, BLOOD   Collection Time   03/03/11  4:41 PM      Component Value Range   Lipase 37  11 - 59 (U/L)  WET PREP, GENITAL   Collection Time   03/03/11  5:30 PM      Component Value Range   Yeast, Wet Prep NONE SEEN  NONE SEEN    Trich, Wet Prep MODERATE (*) NONE SEEN    Clue Cells, Wet Prep NONE SEEN  NONE SEEN    WBC, Wet Prep HPF POC FEW (*) NONE SEEN     Imaging Studies: Dg Abd 1 View  03/03/2011  *RADIOLOGY REPORT*  Clinical Data: Lower abdominal pain  ABDOMEN - 1 VIEW  Comparison: CT 12/16/2010  Findings: Normal bowel gas pattern.  Negative for bowel obstruction.  No renal calculi and no bony abnormality.     IMPRESSION: Negative  Original Report Authenticated By: Camelia Phenes, M.D.   US Abdomen Complete  02/13/2011  *RADIOLOGY REPORT*  Clinical Data:  Upper quadrant pain.  COMPLETE ABDOMINAL ULTRASOUND  Comparison:  CT 12/2010  Findings:  Gallbladder:  No gallstones, gallbladder wall thickening, or pericholecystic fluid. The overall size of the gallbladder is small for a patient who has been satisfactorily n.p.o. for the exam. Evaluation for a sonographic Murphy's sign is negative  Common bile duct:  Has a width of 3.1 mm and a normal appearance  Liver:  No focal lesion identified.  Within normal limits in parenchymal echogenicity. No signs of intrahepatic ductal dilatation are noted  IVC:  Appears normal.  Pancreas:  No focal abnormality seen.  Spleen:  Has a sagittal length of 7.3 cm.  No focal parenchymal abnormalities are seen.  Right Kidney:  Has a sagittal length of 12.3 cm.  No focal parenchymal abnormality or signs of hydronephrosis are noted  Left Kidney:  Has a sagittal length of 11 cm.  No focal parenchymal abnormality or signs of hydronephrosis are evident  Abdominal aorta:  Has a maximal caliber of 2.3 cm with no aneurysmal dilatation identified  IMPRESSION: Somewhat small size to the gallbladder in a patient who has been n.p.o. for the exam.  No gallbladder wall thickening or other sonographic signs are seen to suggest underlying chronic cholecystitis.  If clinical concern warrants, functional analysis of the gallbladder can be performed with HIDA scanning.  Original Report Authenticated By: Bertha Stakes, M.D.   US Transvaginal Non-ob  02/13/2011  *RADIOLOGY REPORT*  Clinical Data: Follow-up cyst.  Hysterectomy with questionable bilateral oophorectomy.  History of fibroid and endometriosis  TRANSABDOMINAL AND TRANSVAGINAL ULTRASOUND OF PELVIS Technique:  Both transabdominal and transvaginal ultrasound examinations of the pelvis were performed. Transabdominal technique was performed for global  imaging of the pelvis including uterus, ovaries, adnexal regions, and pelvic cul-de-sac.  Comparison: CT 12/2010   It was necessary  to proceed with endovaginal exam following the transabdomnial exam to visualize the adnexa and vaginal cuff.  Findings:  Uterus: Has been surgically removed.  A normal vaginal cuff is seen  Endometrium: Not applicable  Right ovary:  A normal right ovary is not visualized.  In the right adnexa there is an oval fluid collection identified measuring 1.8 x 0.9 x 2.2 cm.  No surrounding ovarian tissue is seen and this is felt to represent loculated fluid and not an ovarian cyst. This is smaller than this cystic area noted on the prior CT.  No suspicious or worrisome features are identified.  Left ovary: A normal left ovary is not visualized.  No adnexal masses are seen.  Other findings: A small amount of simple free fluid is noted in the cul-de-sac.    IMPRESSION: Small oval area of fluid in the right adnexa, smaller than noted on the prior CT with no normal ovarian tissue identified on either side.  The appearance is suspicious for a small loculated area of fluid.  This finding has no worrisome features and if the patient is asymptomatic, given the size, follow-up could obtained in 1 year. Normal vaginal cuff  This recommendation follows the consensus statement:  Management of Asymptomatic Ovarian and Other Adnexal Cysts Imaged at [place]:  Society of Radiologists in Ultrasound Consensus Conference Statement. Radiology 2010; [id number].  Available online at: http://radiology.http://gonzales.info/.  Original Report Authenticated By: Bertha Stakes, M.D.    Assessment: Pelvic Pain  Trichamonas  Plan: Pain relieve with 2 mg Ativan IM & Percocet while in MAU Flagyl 2 gm given po prior to d/c D/C Home, FU in [facility] as scheduled    Ashtian Villacis E. 03/04/2011, 9:29 AM

## 2011-03-03 NOTE — Progress Notes (Signed)
Pt was seen 1.5 weeks ago, seen in radiology for increased swelling in her lower abdomen, back pain shooting down her legs. Is awaiting radiology results. Has to walk everywhere, and the pain is borderline unbearable. Denies bleeding or vaginal d/c changes.

## 2011-03-06 ENCOUNTER — Encounter: Payer: Self-pay | Admitting: Internal Medicine

## 2011-03-13 ENCOUNTER — Encounter: Payer: Self-pay | Admitting: Obstetrics and Gynecology

## 2011-03-13 ENCOUNTER — Ambulatory Visit (INDEPENDENT_AMBULATORY_CARE_PROVIDER_SITE_OTHER): Payer: Self-pay | Admitting: Obstetrics and Gynecology

## 2011-03-13 VITALS — BP 133/92 | HR 92 | Temp 98.6°F | Ht 65.0 in | Wt 226.6 lb

## 2011-03-13 DIAGNOSIS — R109 Unspecified abdominal pain: Secondary | ICD-10-CM | POA: Insufficient documentation

## 2011-03-13 MED ORDER — OXYCODONE-ACETAMINOPHEN 10-650 MG PO TABS: 1.0000 | ORAL_TABLET | Freq: Four times a day (QID) | ORAL | Status: AC | PRN

## 2011-03-13 NOTE — Progress Notes (Signed)
This am white discharge with bowel movement.

## 2011-03-13 NOTE — Progress Notes (Addendum)
49yo [id number] with h/o BSO in 12/2009 secondary to pelvic pain and complex cyst presenting today to review the results of the ultrasound recently performed. Patient was seen in May 2012 in ED secondary to pelvic pain. CT demonstrated the presence of a ? 4cm-pelvic mass most consistent with ovarian mass or cyst. Patient also had TSH and CA-125 performed on that day. Results were reviewed with the patient which demonstrated normal lab values and an ultrasound which did not demonstrate the presence of any ovarian tissue but rather 2cm loculated fluid. Patient c/o occasional abdominal pain which she attributes to this cyst.   Results were reviewed with the patient. Patient plans to follow-up with internal medicine for evaluation of her esophagus and abdominal pain. In the meantime 20tabs of percocet was provided.

## 2011-05-06 LAB — CBC
HCT: 44.8
Hemoglobin: 15.4 — ABNORMAL HIGH
MCHC: 34.5
MCV: 89.3
Platelets: 220
RBC: 5.02
RDW: 14
WBC: 6.1

## 2011-05-06 LAB — COMPREHENSIVE METABOLIC PANEL
Alkaline Phosphatase: 40
BUN: 9
CO2: 26
Chloride: 106
Creatinine, Ser: 0.88
GFR calc non Af Amer: >60
Glucose, Bld: 96
Potassium: 4.2
Total Bilirubin: 0.7

## 2011-05-06 LAB — DIFFERENTIAL
Basophils Absolute: 0
Basophils Relative: 1
Eosinophils Absolute: 0.1
Eosinophils Relative: 1
Lymphocytes Relative: 25
Lymphs Abs: 1.5
Monocytes Absolute: 0.3
Monocytes Relative: 4
Neutro Abs: 4.1
Neutrophils Relative %: 68

## 2011-05-06 LAB — SEDIMENTATION RATE: Sed Rate: 2

## 2011-05-13 LAB — URINALYSIS, ROUTINE W REFLEX MICROSCOPIC
Bilirubin Urine: NEGATIVE
Glucose, UA: NEGATIVE
Hgb urine dipstick: NEGATIVE
Ketones, ur: NEGATIVE
Nitrite: NEGATIVE
Protein, ur: NEGATIVE
Specific Gravity, Urine: 1.023
Urobilinogen, UA: 0.2
pH: 5.5

## 2011-05-13 LAB — WET PREP, GENITAL: Yeast Wet Prep HPF POC: NONE SEEN

## 2011-05-13 LAB — GC/CHLAMYDIA PROBE AMP, GENITAL
Chlamydia, DNA Probe: NEGATIVE
GC Probe Amp, Genital: NEGATIVE

## 2011-05-13 LAB — RPR: RPR Ser Ql: NONREACTIVE

## 2011-06-25 ENCOUNTER — Encounter (HOSPITAL_COMMUNITY): Payer: Self-pay

## 2011-06-25 ENCOUNTER — Inpatient Hospital Stay (HOSPITAL_COMMUNITY)
Admission: AD | Admit: 2011-06-25 | Discharge: 2011-06-25 | Disposition: A | Discharge status: Home / Self Care | Payer: Self-pay | Source: Ambulatory Visit | Attending: Obstetrics & Gynecology | Admitting: Obstetrics & Gynecology

## 2011-06-25 DIAGNOSIS — N644 Mastodynia: Secondary | ICD-10-CM

## 2011-06-25 DIAGNOSIS — G479 Sleep disorder, unspecified: Secondary | ICD-10-CM

## 2011-06-25 MED ORDER — OXYCODONE-ACETAMINOPHEN 5-325 MG PO TABS
1.0000 | ORAL_TABLET | Freq: Once | ORAL | Status: AC
  Administered 2011-06-25: 1 via ORAL
  Filled 2011-06-25: qty 1

## 2011-06-25 NOTE — ED Notes (Signed)
No adverse effect from percocet, rates pain 8/10.

## 2011-06-25 NOTE — Progress Notes (Signed)
Pt states she needs medications to help her rest, went to see psychiatrist, unable to be seen by her, no sleep since Sunday. Here for breast pain on her r side that is shooting intermittently down her r breast x3 weeks. Has concerns that she gets centered/focused on breast issues since seeing many on television. Took advil for pain, will numb pain, but pain remains. Also took [employer].

## 2011-06-25 NOTE — ED Provider Notes (Signed)
History     Chief Complaint  Patient presents with  . Breast Pain   HPIThelma E Marshall50 y.o. presents with complaints of right breast pain that shoots down from top left through to the nipple.  Patient of GYN clinic at [facility].  Had tubes and ovaries removed she thinks by Dr. Debroah Loop.  Had hyst earlier.  Last mamogram 1 year ago-negative for cancer.She denies masses and nipple discharge.  Gets worried about it when she sees things on TV.  Went to see Psychiatrist (skyed in) but didn't have appt because she said it is an emergency, none of the medications work and she can't sleep.  Took 8 unisom and 2 tylenol PM Tabs Sunday and hasn\'t slept even with that.   The psychiatric office said they couldn\'t do anything until next week when she is on standby for appt.  [facility].  Trazadone doesn\'t work.  She has talked to her therapist and they suggested if she was coming here for her breast that we Rx something for sleep.  Hx of bipolar, depression, polysubstance abuse.    She is asking for the "medication Dr. Arnold gave me when I was in the hospital".    Past Medical History  Diagnosis Date  . Fibroids     now s/p hysterectomy  . Left ventricular hypertrophy   . Polysubstance abuse     IV drug [place], cocaine on occassion, smoking and alcoholism, has been going to AA, not completely sober  . Hypertension   . bipolar   . Depression     Past Surgical History  Procedure Date  . Abdominal hysterectomy     20 03  . Exploratory laparotomy     complex pelvic mass 2011  . Bilateral salpingoophorectomy     01/2010    Family History  Problem Relation Age of Onset  . Hypertension Mother   . Hypertension Sister   . Diabetes Father     History  Substance Use Topics  . Smoking status: Current Everyday Smoker -- 0.5 packs/day for 15 years    Types: Cigarettes  . Smokeless tobacco: Never Used  . Alcohol Use: No     Occasional drinker    Allergies:  Allergies    Allergen Reactions  . Ibuprofen     REACTION: hives, swelling, breathing issues    Prescriptions prior to admission  Medication Sig Dispense Refill  . atenolol (TENORMIN) 50 MG tablet Take 1 tablet (50 mg total) by mouth daily.  90 tablet  3  . cloNIDine (CATAPRES) 0.2 MG tablet Take 1 tablet (0.2 mg total) by mouth at bedtime.  90 tablet  3  . FLUoxetine (PROZAC) 20 MG tablet Take 1 tablet (20 mg total) by mouth daily.  90 tablet  3  . hydrochlorothiazide 25 MG tablet Take 1 tablet (25 mg total) by mouth daily.  90 tablet  3    Review of Systems  Constitutional:       Inability to sleep  Genitourinary:       + for right breast pain.   Physical Exam   Blood pressure 112/78, pulse 72, temperature 98.8 F (37.1 C), temperature source Oral, resp. rate 20, height 5\' 4"  (1.626 m), weight 234 lb 12.8 oz (106.505 kg), last menstrual period 12/04/2009.  Physical Exam  Constitutional: She is oriented to person, place, and time. She appears well-developed and well-nourished.       Anxious appearing.    Respiratory: Right breast exhibits no inverted  nipple, no mass, no nipple discharge and no skin change. Tenderness: mild diffuse tenderness. Left breast exhibits no inverted nipple, no mass, no nipple discharge, no skin change and no tenderness.  Neurological: She is alert and oriented to person, place, and time.  Skin: Skin is warm and dry.    MAU Course  Procedures  MDM Consulted with Dr. Macon Large regarding patient's complaints.  Discussed her past hx and the patient's request of Rx for sleep aid.  Dr. Macon Large wants me to encourage patient to see her MD next week and sxs worsen may go to [facility].   As for her GYN Complaint, reported breast exam was negative with exception of tenderness on the right.  I have scheduled her an appt with the GYN CLINIC for [facility] and re-evaluation for 12/10 at 2:15.     At 14:36 I went into Patient's room to discuss plan of care.  She states  she breast hurts and she wants something for pain.  She said last time she was here she had 1 percocet that helped with pain. I reviewed her last visit and it was given.   I will give her 1 Percocet here but no prescription because of her history.  Allergy to Ibuprofen and she has taken BC/and Goody products OTC and I am concerned about the amount of Tylenol she may have taken.  She is aware that I will not give Prescription.   Assessment and Plan  A: right breast tenderness     Sleep difficulties    Hx of Bipolar, depression and polysubstance abuse  P:  Appt made for followup in the GYN CLINIC for 12/10 at 2:15.  Instructed patient to keep plans to see her doctor next week and if sxs worsen to go to [facility] for further evaluation.    KEY,EVE M 06/25/2011, 1:52 PM   Matt Holmes, NP 06/25/11 1429  Matt Holmes, NP 06/25/11 1441

## 2011-06-25 NOTE — Progress Notes (Signed)
Patient states she has had a shooting pain in the right breast that starts at mid chest and shoots down to the nipple for about 1-2 weeks. Patient also states that she has been unable to rest and would like something to help with that.

## 2011-06-27 NOTE — ED Provider Notes (Signed)
Attestation of Attending Supervision of Advanced Practitioner: Evaluation and management procedures were performed by the PA/NP/CNM/OB Fellow under my supervision/collaboration. Chart reviewed, and agree with management and plan.  Jaynie Collins, M.D. 06/27/2011 2:31 PM

## 2011-07-13 ENCOUNTER — Encounter (HOSPITAL_COMMUNITY): Payer: Self-pay

## 2011-07-13 ENCOUNTER — Emergency Department (HOSPITAL_COMMUNITY)
Admission: EM | Admit: 2011-07-13 | Discharge: 2011-07-13 | Disposition: A | Discharge status: Home / Self Care | Payer: Self-pay | Attending: Emergency Medicine | Admitting: Emergency Medicine

## 2011-07-13 ENCOUNTER — Emergency Department (HOSPITAL_COMMUNITY): Payer: Self-pay

## 2011-07-13 DIAGNOSIS — R109 Unspecified abdominal pain: Secondary | ICD-10-CM | POA: Insufficient documentation

## 2011-07-13 DIAGNOSIS — R1310 Dysphagia, unspecified: Secondary | ICD-10-CM | POA: Insufficient documentation

## 2011-07-13 DIAGNOSIS — R079 Chest pain, unspecified: Secondary | ICD-10-CM | POA: Insufficient documentation

## 2011-07-13 DIAGNOSIS — F17200 Nicotine dependence, unspecified, uncomplicated: Secondary | ICD-10-CM | POA: Insufficient documentation

## 2011-07-13 DIAGNOSIS — F411 Generalized anxiety disorder: Secondary | ICD-10-CM | POA: Insufficient documentation

## 2011-07-13 DIAGNOSIS — R10819 Abdominal tenderness, unspecified site: Secondary | ICD-10-CM | POA: Insufficient documentation

## 2011-07-13 DIAGNOSIS — I10 Essential (primary) hypertension: Secondary | ICD-10-CM | POA: Insufficient documentation

## 2011-07-13 LAB — COMPREHENSIVE METABOLIC PANEL
ALT: 20 U/L (ref 0–35)
AST: 17 U/L (ref 0–37)
Albumin: 3.8 g/dL (ref 3.5–5.2)
CO2: 26 mEq/L (ref 19–32)
Calcium: 9.7 mg/dL (ref 8.4–10.5)
Chloride: 106 mEq/L (ref 96–112)
Creatinine, Ser: 0.93 mg/dL (ref 0.50–1.10)
GFR calc non Af Amer: 72 mL/min — ABNORMAL LOW (ref >90)
Sodium: 139 mEq/L (ref 135–145)

## 2011-07-13 LAB — CBC
MCHC: 32.9 g/dL (ref 30.0–36.0)
Platelets: 218 10*3/uL (ref 150–400)
RDW: 15.3 % (ref 11.5–15.5)
WBC: 4.9 10*3/uL (ref 4.0–10.5)

## 2011-07-13 LAB — DIFFERENTIAL
Basophils Absolute: 0 10*3/uL (ref 0.0–0.1)
Basophils Relative: 1 % (ref 0–1)
Eosinophils Relative: 1 % (ref 0–5)
Lymphocytes Relative: 39 % (ref 12–46)
Neutro Abs: 2.6 10*3/uL (ref 1.7–7.7)

## 2011-07-13 LAB — POCT I-STAT TROPONIN I: Troponin i, poc: 0 ng/mL (ref 0.00–0.08)

## 2011-07-13 MED ORDER — SODIUM CHLORIDE 0.9 % IV SOLN
INTRAVENOUS | Status: DC
  Administered 2011-07-13: 12:00:00 via INTRAVENOUS

## 2011-07-13 MED ORDER — ASPIRIN 81 MG PO CHEW
324.0000 mg | CHEWABLE_TABLET | Freq: Once | ORAL | Status: DC
  Filled 2011-07-13: qty 4

## 2011-07-13 MED ORDER — MORPHINE SULFATE 2 MG/ML IJ SOLN
INTRAMUSCULAR | Status: AC
  Administered 2011-07-13: 4 mg via INTRAVENOUS
  Filled 2011-07-13: qty 2

## 2011-07-13 MED ORDER — GI COCKTAIL ~~LOC~~
30.0000 mL | Freq: Once | ORAL | Status: AC
  Administered 2011-07-13: 30 mL via ORAL
  Filled 2011-07-13: qty 30

## 2011-07-13 MED ORDER — NITROGLYCERIN 0.4 MG SL SUBL
0.4000 mg | SUBLINGUAL_TABLET | SUBLINGUAL | Status: DC | PRN
  Filled 2011-07-13: qty 25

## 2011-07-13 MED ORDER — MORPHINE SULFATE 4 MG/ML IJ SOLN: 4.0000 mg | Freq: Once | INTRAMUSCULAR | Status: DC

## 2011-07-13 MED ORDER — PANTOPRAZOLE SODIUM 40 MG IV SOLR
40.0000 mg | Freq: Once | INTRAVENOUS | Status: AC
  Administered 2011-07-13: 40 mg via INTRAVENOUS
  Filled 2011-07-13: qty 40

## 2011-07-13 MED ORDER — OXYCODONE-ACETAMINOPHEN 5-325 MG PO TABS: 2.0000 | ORAL_TABLET | ORAL | Status: AC | PRN

## 2011-07-13 NOTE — ED Notes (Signed)
Patient transported to and from X-ray 

## 2011-07-13 NOTE — ED Notes (Signed)
Pt in via ems with painfull swallowing and right upper quad pain states 10/10 denies n/v/d

## 2011-07-13 NOTE — ED Provider Notes (Signed)
History     CSN: [id number] Arrival date & time: 07/13/2011  8:36 AM   First MD Initiated Contact with Patient 07/13/11 1046      Chief Complaint  Patient presents with  . GI Problem    (Consider location/radiation/quality/duration/timing/severity/associated sxs/prior treatment) The history is provided by the patient and medical records.   the patient is a 50 year old female with history of hypertension.  She smokes and drinks alcohol.  She says that she has not been able to eat for the past 2 days, because she has constant pain in the substernal area.  She says every time.  She swallows something it is excruciating pain that doubled her over.  She also says that she has had right-sided upper abdominal pain.  She denies nausea, vomiting, fevers, chills, cough, shortness of breath, or diaphoresis.  Prior to 2 days ago.  She has not had this in the past.  Past Medical History  Diagnosis Date  . Fibroids     now s/p hysterectomy  . Left ventricular hypertrophy   . Polysubstance abuse     IV drug [place], cocaine on occassion, smoking and alcoholism, has been going to AA, not completely sober  . Hypertension   . bipolar   . Depression     Past Surgical History  Procedure Date  . Abdominal hysterectomy     2003  . Exploratory laparotomy     complex pelvic mass 2011  . Bilateral salpingoophorectomy     01/2010    Family History  Problem Relation Age of Onset  . Hypertension Mother   . Hypertension Sister   . Diabetes Father     History  Substance Use Topics  . Smoking status: Current Everyday Smoker -- 0.5 packs/day for 15 years    Types: Cigarettes  . Smokeless tobacco: Never Used  . Alcohol Use: No     Occasional drinker    OB History    Grav Para Term Preterm Abortions TAB SAB Ect Mult Living   5 0 0 0 4 0 1 0 0 0       Review of Systems  Constitutional: Negative for fever, chills and diaphoresis.  HENT: Negative for congestion and neck pain.   Eyes: Negative for  redness.  Respiratory: Negative for cough, chest tightness and shortness of breath.   Cardiovascular: Positive for chest pain. Negative for palpitations.  Gastrointestinal: Positive for abdominal pain. Negative for nausea, vomiting and diarrhea.       Odynophagia  Genitourinary: Negative for dysuria.  Musculoskeletal: Negative for back pain.  Skin: Negative for rash.  Neurological: Negative for light-headedness, numbness and headaches.  Psychiatric/Behavioral: Negative for confusion.    Allergies  Ibuprofen  Home Medications   Current Outpatient Rx  Name Route Sig Dispense Refill  . ATENOLOL 50 MG PO TABS Oral Take 1 tablet (50 mg total) by mouth daily. 90 tablet 3  . CLONIDINE HCL 0.2 MG PO TABS Oral Take 1 tablet (0.2 mg total) by mouth at bedtime. 90 tablet 3  . FLUOXETINE HCL 20 MG PO TABS Oral Take 1 tablet (20 mg total) by mouth daily. 90 tablet 3  . HYDROCHLOROTHIAZIDE 25 MG PO TABS Oral Take 1 tablet (25 mg total) by mouth daily. 90 tablet 3  . LAMOTRIGINE 100 MG PO TABS Oral Take 100 mg by mouth daily.      . TRAZODONE HCL 100 MG PO TABS Oral Take 50-100 mg by mouth at bedtime and may repeat dose one time if needed.  For sleep        BP 146/87  Pulse 55  Temp(Src) 98.5 F (36.9 C) (Oral)  Resp 20  SpO2 100%  LMP 12/04/2009  Physical Exam  Constitutional: She is oriented to person, place, and time. She appears well-developed and well-nourished.       Anxious  HENT:  Head: Normocephalic and atraumatic.  Eyes: Pupils are equal, round, and reactive to light.  Neck: Normal range of motion.  Cardiovascular: Normal rate, regular rhythm and normal heart sounds.   No murmur heard. Pulmonary/Chest: Effort normal and breath sounds normal. No respiratory distress. She has no wheezes. She has no rales.       Very mild sternal tenderness  Abdominal: Soft. She exhibits no distension and no mass. There is tenderness. There is no rebound and no guarding.       Epigastric and  right upper quadrant tenderness.  No peritoneal signs  Musculoskeletal: Normal range of motion. She exhibits no edema and no tenderness.  Neurological: She is alert and oriented to person, place, and time. No cranial nerve deficit.  Skin: Skin is warm and dry. No rash noted. No erythema.  Psychiatric: She has a normal mood and affect. Her behavior is normal.    ED Course  Procedures (including critical care time) 50 year old female complains of painful swallowing and substernal chest pain for the past 2 days.  She also has pain in the right upper quadrant.  She does smoke and have hypertension, but no symptoms or present to suggest that this is an acute coronary syndrome.  We will establish an IV give her medications to control her pain and pursue evaluation consistent with a GI etiology for her symptoms.   Labs Reviewed  CBC  DIFFERENTIAL  I-STAT TROPONIN I  COMPREHENSIVE METABOLIC PANEL  LIPASE, BLOOD     ED ECG REPORT   Date: 07/13/2011  EKG Time: 1027 Rate: 57  Rhythm: sinus bradycardia, Axis: nl  Intervals:none  ST&T Change: Deep T-wave inversions in the inferior and lateral leads.  Narrative Interpretation: Sinus bradycardia with T-wave inversions in the inferior and lateral leads. She had T-wave inversions in the lateral leads on an EKG from 01/01/2010 the inferior T-wave inversions are new.  Today.  It off the T-wave inversions are more prominent than in May of 2011.     1:15 PM  Feels better.        MDM   odynophagia       Nicholes Stairs, MD 07/13/11 1322

## 2011-07-21 ENCOUNTER — Ambulatory Visit: Payer: Self-pay | Admitting: Physician Assistant

## 2011-07-23 ENCOUNTER — Ambulatory Visit (INDEPENDENT_AMBULATORY_CARE_PROVIDER_SITE_OTHER): Payer: Self-pay | Admitting: Advanced Practice Midwife

## 2011-07-23 ENCOUNTER — Encounter: Payer: Self-pay | Admitting: Advanced Practice Midwife

## 2011-07-23 VITALS — BP 149/98 | HR 81 | Temp 97.2°F | Resp 20 | Ht 64.5 in | Wt 231.2 lb

## 2011-07-23 DIAGNOSIS — N951 Menopausal and female climacteric states: Secondary | ICD-10-CM | POA: Insufficient documentation

## 2011-07-23 DIAGNOSIS — N644 Mastodynia: Secondary | ICD-10-CM | POA: Insufficient documentation

## 2011-07-23 DIAGNOSIS — G4700 Insomnia, unspecified: Secondary | ICD-10-CM

## 2011-07-23 MED ORDER — ESZOPICLONE 2 MG PO TABS: 2.0000 mg | ORAL_TABLET | Freq: Every day | ORAL | Status: DC

## 2011-07-23 MED ORDER — INFLUENZA VIRUS VACC SPLIT PF IM SUSP
0.5000 mL | INTRAMUSCULAR | Status: AC | PRN
  Administered 2011-07-23: 0.5 mL via INTRAMUSCULAR

## 2011-07-23 NOTE — Progress Notes (Signed)
  Subjective:    [name], female    DOB: 1961/03/08, 50 y.o.   MRN: [id number]  HPI This is a 50 y.o. female who presents with several complaints. Primarily, she has a sharp shooting pain in her right breast and is overdue for a mammogram. Also, she is having hot flashes since her ovaries were removed, but does not want to take estrogen due to the risks of cancer. Explained relative low risk, but she declines. Also c/o insomnia, not helped by Ambien.   Review of Systems As above    Objective:   Physical Exam Bilateral breast exam normal No masses, dimpling or discoloration BSE taught.      Assessment & Plan:  A:  Mastalgia      Menopausal symptoms, hot flashes      Insomnia      History of mental illness and substance abuse P:  Mammogram scheduled      Discussed hot flashes. May use OTC estroven or sometimes Prozac will help (already on it).  Declines estrogen      Discussed with Dr Debroah Loop.  OK to rx Lunesta.     Pt to follow up with her internist for continuing care

## 2011-08-08 ENCOUNTER — Encounter: Payer: Self-pay | Admitting: Internal Medicine

## 2011-08-08 ENCOUNTER — Other Ambulatory Visit: Payer: Self-pay

## 2011-08-08 ENCOUNTER — Encounter: Payer: Self-pay | Admitting: Gastroenterology

## 2011-08-08 DIAGNOSIS — K219 Gastro-esophageal reflux disease without esophagitis: Secondary | ICD-10-CM | POA: Insufficient documentation

## 2011-08-08 DIAGNOSIS — G8929 Other chronic pain: Secondary | ICD-10-CM

## 2011-08-08 MED ORDER — OXYCODONE-ACETAMINOPHEN 5-325 MG PO TABS: 1.0000 | ORAL_TABLET | Freq: Two times a day (BID) | ORAL | Status: AC | PRN

## 2011-08-08 MED ORDER — OXYCODONE-ACETAMINOPHEN 5-325 MG PO TABS: 1.0000 | ORAL_TABLET | Freq: Two times a day (BID) | ORAL | Status: DC | PRN

## 2011-08-08 MED ORDER — FAMOTIDINE 20 MG PO TABS: 20.0000 mg | ORAL_TABLET | Freq: Two times a day (BID) | ORAL | Status: DC

## 2011-08-08 MED ORDER — OMEPRAZOLE 20 MG PO CPDR: 20.0000 mg | DELAYED_RELEASE_CAPSULE | Freq: Two times a day (BID) | ORAL | Status: DC

## 2011-08-08 NOTE — Progress Notes (Signed)
[name], female   DOB: [birth year], 50 y.o.   MRN: [id number] Jennifer [name] walked into clinic this AM c/o of continued epigastric pain. "Asking for pain medication". She gives a hx of being seen in ED and being given percocet which helped. A GI referral was made but patient never received appt date and time. Denies any alcohol or drug use-remote hx. Describes food coming up in her nose at night, burning epigastric pain.  Assessment:  1. Gastritis, GERD, LES dysfunction or HH: Needs PPI, H2 Blocker and GI eval given severity of symptoms. I cannot see where she has been tried on PPI therapy in past, ED gave her percocet and a GI cocktail which helped. Appointment made with GI in 2 weeks. Given her hx of remote ETOH recurrent ED visits, probably need EDG. No anemia.  Plan:  She is uninsured so I have given her scripts for both Omeprazole and famotidine which she can get at the [facility] department. I also gave her a very short course of percocet and discussed our opiate policy with her-I told her that long term opiates are not the solution and that she would need to keep her appointment with GI to fix underlying problem and take other meds to heal her esophagus. Sent for UDS today.

## 2011-08-08 NOTE — Assessment & Plan Note (Signed)
See 12/28 documentation note.  Started on PPI, H2B GI appt 08/25/11 Clinical presentation and history consistent with severe GERD with esophagitis

## 2011-08-11 LAB — PRESCRIPTION ABUSE MONITORING 15P, URINE
Benzodiazepine Screen, Urine: NEGATIVE NG/ML
Cannabinoid Scrn, Ur: NEGATIVE NG/ML
Cocaine Metabolites: NEGATIVE NG/ML
Creatinine, Urine: 223.4 MG/DL
Fentanyl, Ur: NEGATIVE NG/ML
Meperidine, Ur: NEGATIVE NG/ML
Methadone Screen, Urine: NEGATIVE NG/ML
Opiate Screen, Urine: NEGATIVE NG/ML
Propoxyphene: NEGATIVE NG/ML

## 2011-08-25 ENCOUNTER — Ambulatory Visit: Payer: Self-pay | Admitting: Gastroenterology

## 2011-10-13 ENCOUNTER — Other Ambulatory Visit (HOSPITAL_COMMUNITY): Admission: RE | Admit: 2011-10-13 | Payer: Self-pay | Source: Ambulatory Visit | Admitting: Internal Medicine

## 2011-10-13 ENCOUNTER — Encounter: Payer: Self-pay | Admitting: *Deleted

## 2011-10-13 ENCOUNTER — Encounter: Payer: Self-pay | Admitting: Internal Medicine

## 2011-10-13 ENCOUNTER — Ambulatory Visit (INDEPENDENT_AMBULATORY_CARE_PROVIDER_SITE_OTHER): Payer: Self-pay | Admitting: Internal Medicine

## 2011-10-13 DIAGNOSIS — K219 Gastro-esophageal reflux disease without esophagitis: Secondary | ICD-10-CM

## 2011-10-13 DIAGNOSIS — N951 Menopausal and female climacteric states: Secondary | ICD-10-CM

## 2011-10-13 DIAGNOSIS — F3112 Bipolar disorder, current episode manic without psychotic features, moderate: Secondary | ICD-10-CM

## 2011-10-13 DIAGNOSIS — I10 Essential (primary) hypertension: Secondary | ICD-10-CM

## 2011-10-13 DIAGNOSIS — Z113 Encounter for screening for infections with a predominantly sexual mode of transmission: Secondary | ICD-10-CM

## 2011-10-13 MED ORDER — LISINOPRIL-HYDROCHLOROTHIAZIDE 20-12.5 MG PO TABS: 1.0000 | ORAL_TABLET | Freq: Every day | ORAL | Status: DC

## 2011-10-13 MED ORDER — LAMOTRIGINE 200 MG PO TABS: 200.0000 mg | ORAL_TABLET | Freq: Every day | ORAL | Status: DC

## 2011-10-13 MED ORDER — OMEPRAZOLE 20 MG PO CPDR: DELAYED_RELEASE_CAPSULE | ORAL | Status: DC

## 2011-10-13 MED ORDER — ATENOLOL 50 MG PO TABS: 50.0000 mg | ORAL_TABLET | Freq: Every day | ORAL | Status: DC

## 2011-10-13 MED ORDER — ESTROGENS CONJUGATED 0.3 MG PO TABS: 0.3000 mg | ORAL_TABLET | Freq: Every day | ORAL | Status: DC

## 2011-10-13 MED ORDER — SUCRALFATE 1 G PO TABS: 1.0000 g | ORAL_TABLET | Freq: Four times a day (QID) | ORAL | Status: DC

## 2011-10-13 NOTE — Assessment & Plan Note (Signed)
Clinical symptoms consistent with esophagitis. She has not followup with gastroenterology as instructed.  She continues to ask for narcotics each time she comes in for office visit. I did not feel comfortable prescribing narcotics to control her pain given her history of polysubstance abuse. It is important that she follow up with GI so that they can perform EGD and further treatment. -Prescribe omeprazole 40 mg daily -Patient may take Tylenol for pain control -Prescribe Carafate to see if that would improve her symptoms  Case discussed with attending, Dr. Josem Kaufmann

## 2011-10-13 NOTE — Assessment & Plan Note (Signed)
Unprotected sexual activities -Check HIV, syphilis, urine GC/chlamydia

## 2011-10-13 NOTE — Assessment & Plan Note (Addendum)
Given her symptoms, will prescribe low dose Premarin 0.3mg  qd and try to wean off in 2-3 months once her symptoms improve -She will need to follow up with GYN as well

## 2011-10-13 NOTE — Patient Instructions (Signed)
Start taking omeprazole 40 mg one tablet daily and Carafate as prescribed If your stomach pain does not improve, he will need to followup with a gastroenterologist Increase Lamictal to 200 mg one tablet daily Will get labs today and we will call you with any abnormal lab result Continue taking atenolol 50mg  one tablet daily Stop taking clonidine Start taking lisinopril/HCTZ 20/25mg  1 tablet daily Continue taking your Prozac Start taking Premarin as prescribed for 2-63months, and we will try to taper off Followup in 3-4 weeks with Dr. Anselm Jungling

## 2011-10-13 NOTE — Progress Notes (Signed)
HPI: Jennifer [name] is a 51 yo W with PMH of bipolar disorder, fibroids with status post hysterectomy, complex pelvis mass in 2011, polysubstance abuse, hypertension, depression, GERD presents with multiple complaints.  1. Patient has been having hot flashes in the past several months and wants medication to help with her symptoms however she does not want to be on medication that would worsen her breast fibrocystic disease.  2. Patient needs medication refill for blood pressure medication. She ran out her medication about 3 weeks ago  3. She also wants to be checked for STDs. She had a new partner in the past 6 months and reports not using protection. She denies any vaginal discharge, pain, rashes or lesions in her genital area. She denies any joint tenderness or fevers or chills. She does not want a Pap smear/wet prep today.  4. As for her bipolar, which is not controlled. She is easily angered, not able to concentrate, has not been able to sleep and wants a sleeping medication. She reports that she does see a psychiatrist about every 2 months.  For this problem, she takes Prozac which she states that it is helping, Haldol 2 mg, Lamictal 100mg .  She denies any suicide ideation or HI, visual or auditory hallucination.  5. For her insomnia, patient was prescribed Lunesta in the past however she was not able to afford this medication. She also reports that the trazodone as well as the Ambien do not work for her.  6. She also has problem with swallowing, regurgitation, acid reflux. She was seen in December by Dr. Phillips Odor and was given PPI along with 20 of Percocet and was told to followup with GI with an appointment on January 14; however patient has not been followed with gastroenterology because she states that she does not have transportation. Today she is requesting adamantly for pain medication especially Percocet Vicodin. She states that the tramadol as well as other weak pain medication do not work  for her.  Review of system: As per history of present illness  Physical examination: General: alert, well-developed, and cooperative to examination.   Lungs: normal respiratory effort, no accessory muscle use, normal breath sounds, no crackles, and no wheezes. Heart: normal rate, regular rhythm, no murmur, no gallop, and no rub.  Abdomen: obese, soft, non-tender, normal bowel sounds, no distention, no guarding, no rebound tenderness Msk: no joint swelling, no joint warmth, and no redness over joints.  Pulses: 2+ DP/PT pulses bilaterally Extremities: No cyanosis, clubbing, edema Neurologic: alert & oriented X3, cranial nerves II-XII intact, strength normal in all extremities, sensation intact to light touch, and gait normal.  Skin: turgor normal and no rashes.  Psych: Oriented X3, denies any visual or auditory hallucination. Speech is normal without tangential. Slightly anxious but not depressed appearing.

## 2011-10-13 NOTE — Progress Notes (Unsigned)
Pt presents c/o being out of medicine, needing to renew orange card and wanting just to check her BP. She is informed that she needs to be seen since April will be 1 year since last visit, dr Anselm Jungling has space available at 1045 and the 0930 has not come to clinic at this point she is placed in slot w/ dr Anselm Jungling. No complaints except wants refills.

## 2011-10-13 NOTE — Assessment & Plan Note (Addendum)
Not well-controlled. Appears to be in manic phase.  She denies any SI /HI or hallucinations currently. She needs to be managed by behavior health in which she reports seeing every 2 months. -Will increase Lamictal to 200 mg by mouth daily -Need to set up an appointment with behavior health -Continue Haldol & prozac

## 2011-10-13 NOTE — Progress Notes (Signed)
Agree with plan 

## 2011-10-13 NOTE — Assessment & Plan Note (Addendum)
Not well controlled. Patient has been out of her medication in the past 3 weeks.  -Continue atenolol 50 mg by mouth daily -Stop clonidine 0.2 mg by mouth daily -Continue hydrochlorothiazide 25 mg by mouth daily and add Lisinopril: Will prescribe a combination pill  -Recheck blood pressure in 2 weeks

## 2011-10-14 LAB — RPR

## 2011-10-21 ENCOUNTER — Other Ambulatory Visit: Payer: Self-pay | Admitting: Internal Medicine

## 2011-10-21 MED ORDER — METRONIDAZOLE 500 MG PO TABS: 500.0000 mg | ORAL_TABLET | Freq: Two times a day (BID) | ORAL | Status: AC

## 2011-10-29 ENCOUNTER — Emergency Department (HOSPITAL_COMMUNITY)
Admission: EM | Admit: 2011-10-29 | Discharge: 2011-10-29 | Disposition: A | Discharge status: Home / Self Care | Payer: Self-pay | Attending: Emergency Medicine | Admitting: Emergency Medicine

## 2011-10-29 ENCOUNTER — Encounter (HOSPITAL_COMMUNITY): Payer: Self-pay

## 2011-10-29 DIAGNOSIS — Z79899 Other long term (current) drug therapy: Secondary | ICD-10-CM | POA: Insufficient documentation

## 2011-10-29 DIAGNOSIS — K089 Disorder of teeth and supporting structures, unspecified: Secondary | ICD-10-CM | POA: Insufficient documentation

## 2011-10-29 DIAGNOSIS — I10 Essential (primary) hypertension: Secondary | ICD-10-CM | POA: Insufficient documentation

## 2011-10-29 DIAGNOSIS — H9209 Otalgia, unspecified ear: Secondary | ICD-10-CM | POA: Insufficient documentation

## 2011-10-29 DIAGNOSIS — F319 Bipolar disorder, unspecified: Secondary | ICD-10-CM | POA: Insufficient documentation

## 2011-10-29 DIAGNOSIS — K0889 Other specified disorders of teeth and supporting structures: Secondary | ICD-10-CM

## 2011-10-29 MED ORDER — OXYCODONE-ACETAMINOPHEN 5-325 MG PO TABS: 1.0000 | ORAL_TABLET | ORAL | Status: AC | PRN

## 2011-10-29 MED ORDER — OXYCODONE-ACETAMINOPHEN 5-325 MG PO TABS
1.0000 | ORAL_TABLET | Freq: Once | ORAL | Status: AC
  Administered 2011-10-29: 1 via ORAL
  Filled 2011-10-29: qty 1

## 2011-10-29 MED ORDER — PENICILLIN V POTASSIUM 500 MG PO TABS: 500.0000 mg | ORAL_TABLET | Freq: Two times a day (BID) | ORAL | Status: AC

## 2011-10-29 NOTE — Discharge Instructions (Signed)
Follow up with your dentist for tooth extraction.   Dental Pain A tooth ache may be caused by cavities (tooth decay). Cavities expose the nerve of the tooth to air and hot or cold temperatures. It may come from an infection or abscess (also called a boil or furuncle) around your tooth. It is also often caused by dental caries (tooth decay). This causes the pain you are having. DIAGNOSIS  Your caregiver can diagnose this problem by exam. TREATMENT   If caused by an infection, it may be treated with medications which kill germs (antibiotics) and pain medications as prescribed by your caregiver. Take medications as directed.   Only take over-the-counter or prescription medicines for pain, discomfort, or fever as directed by your caregiver.   Whether the tooth ache today is caused by infection or dental disease, you should see your dentist as soon as possible for further care.  SEEK MEDICAL CARE IF: The exam and treatment you received today has been provided on an emergency basis only. This is not a substitute for complete medical or dental care. If your problem worsens or new problems (symptoms) appear, and you are unable to meet with your dentist, call or return to this location. SEEK IMMEDIATE MEDICAL CARE IF:   You have a fever.   You develop redness and swelling of your face, jaw, or neck.   You are unable to open your mouth.   You have severe pain uncontrolled by pain medicine.  MAKE SURE YOU:   Understand these instructions.   Will watch your condition.   Will get help right away if you are not doing well or get worse.  Document Released: 07/28/2005 Document Revised: 07/17/2011 Document Reviewed: 03/15/2008 [facility] Patient Information 2012 [place], [place].

## 2011-10-29 NOTE — ED Notes (Signed)
Pt complains of an infection in her mouth, ear, and through her head, she complains of severe pain

## 2011-10-29 NOTE — ED Provider Notes (Signed)
History     CSN: [id number]  Arrival date & time 10/29/11  0535   First MD Initiated Contact with Patient 10/29/11 [phone]      Chief Complaint  Patient presents with  . Otalgia  . Wound Infection    (Consider location/radiation/quality/duration/timing/severity/associated sxs/prior treatment) HPI Pt is here for dental pain which started several days ago and became worse last night. Pt states she knows she has teeth that need to be pulled and she has an appointment to see her dentist but that the pain became unbearable last night and she called EMS. No fever chills. Pt states that the dental pain is causing the whole R side of her face to hurt including her R ear. No masses or abscesses Past Medical History  Diagnosis Date  . Fibroids     now s/p hysterectomy  . Left ventricular hypertrophy   . Polysubstance abuse     IV drug [place], cocaine on occassion, smoking and alcoholism, has been going to AA, not completely sober  . Hypertension   . bipolar   . Depression     Past Surgical History  Procedure Date  . Abdominal hysterectomy     2003  . Exploratory laparotomy     complex pelvic mass 2011  . Bilateral salpingoophorectomy     01/2010    Family History  Problem Relation Age of Onset  . Hypertension Mother   . Hypertension Sister   . Diabetes Father     History  Substance Use Topics  . Smoking status: Current Everyday Smoker -- 0.5 packs/day for 15 years    Types: Cigarettes  . Smokeless tobacco: Never Used  . Alcohol Use: No     Occasional drinker    OB History    Grav Para Term Preterm Abortions TAB SAB Ect Mult Living   5 0 0 0 4 0 1 0 0 0       Review of Systems  Constitutional: Negative for fever and chills.  HENT: Positive for ear pain and dental problem. Negative for sore throat, neck pain and neck stiffness.     Allergies  Ibuprofen  Home Medications   Current Outpatient Rx  Name Route Sig Dispense Refill  . ASPIRIN-ACETAMINOPHEN-CAFFEINE  250-250-65 MG PO TABS Oral Take 2 tablets by mouth every 6 (six) hours as needed. For pain.     . ATENOLOL 50 MG PO TABS Oral Take 1 tablet (50 mg total) by mouth daily. 30 tablet 6  . ESTROGENS CONJUGATED 0.3 MG PO TABS Oral Take 1 tablet (0.3 mg total) by mouth daily. Take daily for 21 days then do not take for 7 days. 30 tablet 3  . FLUOXETINE HCL 20 MG PO TABS Oral Take 20 mg by mouth daily.      [name] HALOPERIDOL 2 MG PO TABS Oral Take 2 mg by mouth at bedtime.      [name] HYDROXYZINE HCL 25 MG PO TABS Oral Take 25 mg by mouth 3 (three) times daily.     [name] LAMOTRIGINE 200 MG PO TABS Oral Take 1 tablet (200 mg total) by mouth daily. 30 tablet 6  . LISINOPRIL-HYDROCHLOROTHIAZIDE 20-12.5 MG PO TABS Oral Take 1 tablet by mouth daily. 90 tablet 4  . METRONIDAZOLE 500 MG PO TABS Oral Take 1 tablet (500 mg total) by mouth 2 (two) times daily. 14 tablet 0  . OMEPRAZOLE 20 MG PO CPDR Oral Take 1 capsule (20 mg total) by mouth 2 (two) times daily. 60 capsule 1  .  OMEPRAZOLE 20 MG PO CPDR  Take 2 tablets daily 60 capsule 6  . OXYCODONE-ACETAMINOPHEN 5-325 MG PO TABS Oral Take 1 tablet by mouth every 4 (four) hours as needed for pain. 10 tablet 0  . PENICILLIN V POTASSIUM 500 MG PO TABS Oral Take 1 tablet (500 mg total) by mouth 2 (two) times daily. 20 tablet 0  . SUCRALFATE 1 G PO TABS Oral Take 1 tablet (1 g total) by mouth 4 (four) times daily. 120 tablet 3    BP 148/96  Pulse 61  Temp(Src) 98.7 F (37.1 C) (Oral)  Resp 16  SpO2 97%  LMP 12/04/2009  Physical Exam  Nursing note and vitals reviewed. Constitutional: She is oriented to person, place, and time. She appears well-developed and well-nourished.  HENT:  Head: Normocephalic and atraumatic.  Left Ear: External ear normal.  Mouth/Throat: Oropharyngeal exudate present.       Fluid behind R TM. No erythema  Poor dentition R lower molars with fractured tooth present 2nd R lower molar. No masses or abscess.   Neck: Normal range of motion. Neck  supple.  Pulmonary/Chest: Effort normal.  Abdominal: Soft.  Musculoskeletal: Normal range of motion.  Neurological: She is alert and oriented to person, place, and time.  Skin: Skin is warm and dry.  Psychiatric: She has a normal mood and affect. Her behavior is normal.    ED Course  Procedures (including critical care time)  Labs Reviewed - No data to display No results found.   1. Pain, dental       MDM  Will give pain meds and abxs and have encouraged to f/u with dentist for tooth extraction.         Loren Racer, MD 10/29/11 [phone]

## 2011-11-13 ENCOUNTER — Other Ambulatory Visit: Payer: Self-pay | Admitting: *Deleted

## 2011-11-13 DIAGNOSIS — Z1231 Encounter for screening mammogram for malignant neoplasm of breast: Secondary | ICD-10-CM

## 2011-11-20 ENCOUNTER — Ambulatory Visit: Payer: Self-pay

## 2012-01-15 ENCOUNTER — Emergency Department (HOSPITAL_COMMUNITY)
Admission: EM | Admit: 2012-01-15 | Discharge: 2012-01-15 | Disposition: A | Discharge status: Home / Self Care | Payer: Self-pay | Attending: Emergency Medicine | Admitting: Emergency Medicine

## 2012-01-15 ENCOUNTER — Other Ambulatory Visit: Payer: Self-pay | Admitting: *Deleted

## 2012-01-15 ENCOUNTER — Encounter (HOSPITAL_COMMUNITY): Payer: Self-pay | Admitting: Emergency Medicine

## 2012-01-15 DIAGNOSIS — A599 Trichomoniasis, unspecified: Secondary | ICD-10-CM | POA: Insufficient documentation

## 2012-01-15 DIAGNOSIS — F17200 Nicotine dependence, unspecified, uncomplicated: Secondary | ICD-10-CM | POA: Insufficient documentation

## 2012-01-15 DIAGNOSIS — I10 Essential (primary) hypertension: Secondary | ICD-10-CM | POA: Insufficient documentation

## 2012-01-15 DIAGNOSIS — Z202 Contact with and (suspected) exposure to infections with a predominantly sexual mode of transmission: Secondary | ICD-10-CM | POA: Insufficient documentation

## 2012-01-15 DIAGNOSIS — F319 Bipolar disorder, unspecified: Secondary | ICD-10-CM | POA: Insufficient documentation

## 2012-01-15 DIAGNOSIS — Z79899 Other long term (current) drug therapy: Secondary | ICD-10-CM | POA: Insufficient documentation

## 2012-01-15 LAB — URINALYSIS, ROUTINE W REFLEX MICROSCOPIC
Ketones, ur: NEGATIVE mg/dL
Leukocytes, UA: NEGATIVE
Protein, ur: NEGATIVE mg/dL
Urobilinogen, UA: 0.2 mg/dL (ref 0.0–1.0)

## 2012-01-15 LAB — WET PREP, GENITAL

## 2012-01-15 MED ORDER — HYDROCODONE-ACETAMINOPHEN 5-325 MG PO TABS
1.0000 | ORAL_TABLET | Freq: Once | ORAL | Status: AC
  Administered 2012-01-15: 1 via ORAL
  Filled 2012-01-15: qty 1

## 2012-01-15 MED ORDER — ATENOLOL 50 MG PO TABS: 50.0000 mg | ORAL_TABLET | Freq: Every day | ORAL | Status: DC

## 2012-01-15 MED ORDER — METRONIDAZOLE 500 MG PO TABS: 500.0000 mg | ORAL_TABLET | Freq: Two times a day (BID) | ORAL | Status: AC

## 2012-01-15 MED ORDER — FLUOXETINE HCL 20 MG PO TABS: 20.0000 mg | ORAL_TABLET | Freq: Every day | ORAL | Status: DC

## 2012-01-15 MED ORDER — METRONIDAZOLE 500 MG PO TABS
500.0000 mg | ORAL_TABLET | Freq: Once | ORAL | Status: AC
  Administered 2012-01-15: 500 mg via ORAL
  Filled 2012-01-15: qty 1

## 2012-01-15 NOTE — Discharge Instructions (Signed)
Your swab showed that you have trichomonas which is a type of sexually transmitted infection. You will get a call in 1-2 days if you are positive for gonorrhea or chlamydia. You may take Tylenol as needed for pain. Take ALL of the antibiotic as prescribed. This medicine can make you have an adverse reaction if you take it while drinking alcohol so do not drink while taking it. Do not have intercourse until your treatment is complete and make sure your partners receive treatment. Follow up with your family doctor if your discomfort continues.  Trichomoniasis Trichomoniasis is an infection, caused by the Trichomonas organism, that affects both women and men. In women, the outer female genitalia and the vagina are affected. In men, the penis is mainly affected, but the prostate and other reproductive organs can also be involved. Trichomoniasis is a sexually transmitted disease (STD) and is most often passed to another person through sexual contact. The majority of people who get trichomoniasis do so from a sexual encounter and are also at risk for other STDs. CAUSES   Sexual intercourse with an infected partner.   It can be present in swimming pools or hot tubs.  SYMPTOMS   Abnormal gray-green frothy vaginal discharge in women.   Vaginal itching and irritation in women.   Itching and irritation of the area outside the vagina in women.   Penile discharge with or without pain in males.   Inflammation of the urethra (urethritis), causing painful urination.   Bleeding after sexual intercourse.  RELATED COMPLICATIONS  Pelvic inflammatory disease.   Infection of the uterus (endometritis).   Infertility.   Tubal (ectopic) pregnancy.   It can be associated with other STDs, including gonorrhea and chlamydia, hepatitis B, and HIV.  COMPLICATIONS DURING PREGNANCY  Early (premature) delivery.   Premature rupture of the membranes (PROM).   Low birth weight.  DIAGNOSIS   Visualization of  Trichomonas under the microscope from the vagina discharge.   Ph of the vagina greater than 4.5, tested with a test tape.   Trich Rapid Test.   Culture of the organism, but this is not usually needed.   It may be found on a Pap test.   Having a "strawberry cervix,"which means the cervix looks very red like a strawberry.  TREATMENT   You may be given medication to fight the infection. Inform your caregiver if you could be or are pregnant. Some medications used to treat the infection should not be taken during pregnancy.   Over-the-counter medications or creams to decrease itching or irritation may be recommended.   Your sexual partner will need to be treated if infected.  HOME CARE INSTRUCTIONS   Take all medication prescribed by your caregiver.   Take over-the-counter medication for itching or irritation as directed by your caregiver.   Do not have sexual intercourse while you have the infection.   Do not douche or wear tampons.   Discuss your infection with your partner, as your partner may have acquired the infection from you. Or, your partner may have been the person who transmitted the infection to you.   Have your sex partner examined and treated if necessary.   Practice safe, informed, and protected sex.   See your caregiver for other STD testing.  SEEK MEDICAL CARE IF:   You still have symptoms after you finish the medication.   You have an oral temperature above 102 F (38.9 C).   You develop belly (abdominal) pain.   You have pain when  you urinate.   You have bleeding after sexual intercourse.   You develop a rash.   The medication makes you sick or makes you throw up (vomit).  Document Released: 01/21/2001 Document Revised: 07/17/2011 Document Reviewed: 02/16/2009 [facility] Patient Information 2012 [place], [place].

## 2012-01-15 NOTE — Telephone Encounter (Signed)
Both Rx called into pharmacy and flag sent to front desk pool to make appt per Dr Rogelia Boga.

## 2012-01-15 NOTE — ED Notes (Signed)
Pt reports that she had intercourse over a week ago and is concerned that she has an STD and wants to be checked out. Pt reports pain and burning in Vaginal area.

## 2012-01-15 NOTE — ED Notes (Signed)
Pt called out to state she is in more pain since the pelvic exam.  Santina Evans, [place] made aware.

## 2012-01-15 NOTE — Telephone Encounter (Signed)
Pt last seen Fab and 4 week F/U requested. Pls make appt Dr Anselm Jungling next three months

## 2012-01-15 NOTE — ED Provider Notes (Signed)
Medical screening examination/treatment/procedure(s) were performed by non-physician practitioner and as supervising physician I was immediately available for consultation/collaboration.   Gwyneth Sprout, MD 01/15/12 [phone]

## 2012-01-15 NOTE — Progress Notes (Signed)
Pt self pay seen by [facility] Pt has active orange card and pcp is [facility] family [facility] center. Pt has her orange card with her.  [facility] spoke with pt about returning to [facility] family practice center for medical services Provided with [facility] resources

## 2012-01-15 NOTE — ED Provider Notes (Signed)
History     CSN: [id number]  Arrival date & time 01/15/12  1404   First MD Initiated Contact with Patient 01/15/12 1424      Chief Complaint  Patient presents with  . Exposure to STD    (Consider location/radiation/quality/duration/timing/severity/associated sxs/prior treatment) HPI History from patient. 51 year old female who presents after possible STD exposure. States that she had intercourse with a new partner about a week and a half ago. She has not noted any vaginal discharge, but notes a sensation of discomfort to the external genitalia.  She also states that she has had discomfort to the right lower abdomen for about the past 2 weeks. Pain has been intermittent in nature and has gradually been getting worse over the past several days. Pain is still an achy in nature and does not radiate. It occasionally gets worse with weightbearing and movement. She has not had any nausea, vomiting, diarrhea, urinary symptoms with this. Denies wt loss, change in appetite. She is status post total abdominal hysterectomy and bilateral salpingo-oophorectomy.  Past Medical History  Diagnosis Date  . Fibroids     now s/p hysterectomy  . Left ventricular hypertrophy   . Polysubstance abuse     IV drug [place], cocaine on occassion, smoking and alcoholism, has been going to AA, not completely sober  . Hypertension   . bipolar   . Depression     Past Surgical History  Procedure Date  . Abdominal hysterectomy     2003  . Exploratory laparotomy     complex pelvic mass 2011  . Bilateral salpingoophorectomy     01/2010    Family History  Problem Relation Age of Onset  . Hypertension Mother   . Hypertension Sister   . Diabetes Father     History  Substance Use Topics  . Smoking status: Current Everyday Smoker -- 0.5 packs/day for 15 years    Types: Cigarettes  . Smokeless tobacco: Never Used  . Alcohol Use: No     Occasional drinker    OB History    Grav Para Term Preterm Abortions TAB  SAB Ect Mult Living   5 0 0 0 4 0 1 0 0 0       Review of Systems  Constitutional: Negative for fever, chills and appetite change.  Respiratory: Negative for cough.   Cardiovascular: Negative for chest pain.  Gastrointestinal: Negative for nausea, vomiting, diarrhea and constipation.  Genitourinary: Positive for pelvic pain. Negative for dysuria, decreased urine volume, vaginal bleeding and vaginal discharge.  Skin: Negative for color change and rash.  All other systems reviewed and are negative.    Allergies  Ibuprofen  Home Medications   Current Outpatient Rx  Name Route Sig Dispense Refill  . ASPIRIN-ACETAMINOPHEN-CAFFEINE 250-250-65 MG PO TABS Oral Take 2 tablets by mouth every 6 (six) hours as needed. For pain.     . ATENOLOL 50 MG PO TABS Oral Take 1 tablet (50 mg total) by mouth daily. 30 tablet 2  . ESTROGENS CONJUGATED 0.3 MG PO TABS Oral Take 1 tablet (0.3 mg total) by mouth daily. Take daily for 21 days then do not take for 7 days. 30 tablet 3  . FLUOXETINE HCL 20 MG PO TABS Oral Take 1 tablet (20 mg total) by mouth daily. 30 tablet 2  . HALOPERIDOL 2 MG PO TABS Oral Take 2 mg by mouth at bedtime.      [name] HYDROXYZINE HCL 25 MG PO TABS Oral Take 25 mg by mouth 3 (three)  times daily.     [name] LAMOTRIGINE 200 MG PO TABS Oral Take 1 tablet (200 mg total) by mouth daily. 30 tablet 6  . LISINOPRIL-HYDROCHLOROTHIAZIDE 20-12.5 MG PO TABS Oral Take 1 tablet by mouth daily. 90 tablet 4  . OMEPRAZOLE 20 MG PO CPDR Oral Take 1 capsule (20 mg total) by mouth 2 (two) times daily. 60 capsule 1  . OMEPRAZOLE 20 MG PO CPDR  Take 2 tablets daily 60 capsule 6  . SUCRALFATE 1 G PO TABS Oral Take 1 tablet (1 g total) by mouth 4 (four) times daily. 120 tablet 3    BP 146/96  Pulse 85  Temp(Src) 98.6 F (37 C) (Oral)  Resp 18  SpO2 99%  LMP 12/04/2009  Physical Exam  Nursing note and vitals reviewed. Constitutional: She appears well-developed and well-nourished. No distress.  HENT:    Head: Normocephalic and atraumatic.  Mouth/Throat: Oropharynx is clear and moist. No oropharyngeal exudate.  Neck: Normal range of motion.  Cardiovascular: Normal rate, regular rhythm and normal heart sounds.  Exam reveals no gallop and no friction rub.   No murmur heard. Pulmonary/Chest: Effort normal and breath sounds normal. She exhibits no tenderness.  Abdominal: Soft. Bowel sounds are normal. There is tenderness.         Pelvic pain as diagrammed. No rebound or guard. Nontender at [place]ney's. Negative obturator, jar, Rovsing.  Genitourinary:       Mild thin white vaginal dc in vaginal vault. No lesions seen. Ext genitalia normal in appearance. Nontender to bimanual.  Musculoskeletal: Normal range of motion.  Neurological: She is alert.  Skin: Skin is warm and dry. She is not diaphoretic.  Psychiatric: She has a normal mood and affect.    ED Course  Procedures (including critical care time)  Labs Reviewed  WET PREP, GENITAL - Abnormal; Notable for the following:    Trich, Wet Prep MODERATE (*)    Clue Cells Wet Prep HPF POC FEW (*)    WBC, Wet Prep HPF POC MODERATE (*)    All other components within normal limits  URINALYSIS, ROUTINE W REFLEX MICROSCOPIC  GC/CHLAMYDIA PROBE AMP, GENITAL  RPR   No results found.   1. Trichomonas       MDM  Pt with pelvic pain x 2 weeks and poss STI exposure about a week and a half ago. On exam, mildly tender to palp to R pelvic area as diagrammed. Do not suspect acute abd given the length of time of the patient's symptoms and the exam today. Wet prep is trich +, Rx for flagyl course given. Instructed to f/u with PCP if pain continues. Reasons to return to ED for worsening/changing pain discussed.       Grant Fontana, [place] 01/15/12 1919

## 2012-01-15 NOTE — ED Notes (Signed)
PA aware that pelvic exam is set up.

## 2012-01-16 LAB — RPR: RPR Ser Ql: NONREACTIVE

## 2012-01-16 LAB — GC/CHLAMYDIA PROBE AMP, GENITAL
Chlamydia, DNA Probe: UNDETERMINED
GC Probe Amp, Genital: NEGATIVE

## 2012-02-03 ENCOUNTER — Other Ambulatory Visit: Payer: Self-pay | Admitting: Advanced Practice Midwife

## 2012-02-11 ENCOUNTER — Other Ambulatory Visit: Payer: Self-pay | Admitting: Internal Medicine

## 2012-02-17 ENCOUNTER — Encounter: Payer: Self-pay | Admitting: Internal Medicine

## 2012-02-19 ENCOUNTER — Encounter: Payer: Self-pay | Admitting: Licensed Clinical Social Worker

## 2012-02-19 ENCOUNTER — Ambulatory Visit
Admission: RE | Admit: 2012-02-19 | Discharge: 2012-02-19 | Disposition: A | Discharge status: Home / Self Care | Payer: Self-pay | Source: Ambulatory Visit | Attending: Advanced Practice Midwife | Admitting: Advanced Practice Midwife

## 2012-02-19 DIAGNOSIS — N644 Mastodynia: Secondary | ICD-10-CM

## 2012-02-19 NOTE — Progress Notes (Signed)
Jennifer [name] presents as a walk-in today to see CSW.  Pt states she has been unable to maintain income and is continuing to applying for Disability.  Pt voiced concern that she has worked since the age of 38, but SSA states she has not earned enough credits to qualify for [employer].  Pt states she receives food stamps and maintains residence in a "shed".  She reports she is not homeless.  Pt maintains her [facility] orange card and is aware of the resources at [facility], but states she has difficulty with transportation at times. Jennifer [name] presents today requesting assistance for purchasing toiletry items.  CSW provided Jennifer [name] with a $14 [employer] gift card for toiletries, items from [facility] pantry and 2 one way fare bus passes.  CSW informed pt of differences between SSDI and SSI, encouraged Jennifer [name] to explore SSI.  Pt states she has a meeting at [facility] and will continue to follow through. Pt aware of need to reschedule appt with Dr. Anselm Jungling.  Jennifer [name] denies add'l needs at this time and is aware CSW is available as needed.

## 2012-02-26 ENCOUNTER — Ambulatory Visit (INDEPENDENT_AMBULATORY_CARE_PROVIDER_SITE_OTHER): Payer: Self-pay | Admitting: Internal Medicine

## 2012-02-26 ENCOUNTER — Encounter: Payer: Self-pay | Admitting: Internal Medicine

## 2012-02-26 VITALS — BP 139/94 | HR 61 | Temp 97.2°F | Ht 64.0 in | Wt 235.4 lb

## 2012-02-26 DIAGNOSIS — F411 Generalized anxiety disorder: Secondary | ICD-10-CM

## 2012-02-26 DIAGNOSIS — N951 Menopausal and female climacteric states: Secondary | ICD-10-CM

## 2012-02-26 DIAGNOSIS — F419 Anxiety disorder, unspecified: Secondary | ICD-10-CM

## 2012-02-26 DIAGNOSIS — M7130 Other bursal cyst, unspecified site: Secondary | ICD-10-CM

## 2012-02-26 DIAGNOSIS — G8929 Other chronic pain: Secondary | ICD-10-CM

## 2012-02-26 DIAGNOSIS — K219 Gastro-esophageal reflux disease without esophagitis: Secondary | ICD-10-CM

## 2012-02-26 DIAGNOSIS — R232 Flushing: Secondary | ICD-10-CM

## 2012-02-26 MED ORDER — OMEPRAZOLE 20 MG PO CPDR: 20.0000 mg | DELAYED_RELEASE_CAPSULE | Freq: Two times a day (BID) | ORAL | Status: DC

## 2012-02-26 MED ORDER — ESTROGENS CONJUGATED 0.3 MG PO TABS: 0.6000 mg | ORAL_TABLET | Freq: Every day | ORAL | Status: DC

## 2012-02-26 MED ORDER — ALPRAZOLAM 0.25 MG PO TABS: 0.2500 mg | ORAL_TABLET | Freq: Every evening | ORAL | Status: DC | PRN

## 2012-02-26 NOTE — Assessment & Plan Note (Signed)
Patient reports having severe hot flash. Low dose of Premarin, 0.3 mg daily did not improve her symptoms. We'll increase Premarin dosage to 0.6 mg daily. We'll followup.

## 2012-02-26 NOTE — Patient Instructions (Addendum)
1.  dizziness started taking Prilosec 20 mg 2 times per day regularly. Please start taking Premarin pill, 0.6 mg daily. 2. Please go to [facility] clinic as soon as possible. Clinic will be open at 8:00 AM to 3:00PM.  2.. If you have worsening of your symptoms or new symptoms arise, please call the clinic ([phone]), or go to the ER immediately if symptoms are severe.

## 2012-02-26 NOTE — Assessment & Plan Note (Signed)
Patient has severe symptoms for anxiety. It is difficult to differentiate her anxiety from her ongoing psych issues, including depression and bipolar disorder. We'll give her referral [facility]. Will treat her with Xanax shortly until she see psychiatrist. Dr. Aundria Rud talked to patient and made it very clear that we will not continue to prescribe Xanax; and she should go to [facility] clinic as soon as possible. She agreed to do so.

## 2012-02-26 NOTE — Progress Notes (Signed)
[name], female   DOB: [birth year], 51 y.o.   MRN: [id number]  Subjective:   [name] female   DOB: 12-29-1960 51 y.o.   MRN: [id number]  HPI: Jennifer [name] is a 51 y.o. with a past medical history as outlined below, who presents for a followup visit.  1) Hot flash: Patient reports that her hot flash has not been controlled well by Premarin 0.3 mg daily. She reports that she took Premarin for 2 months without significant improvement of her hot flash. Currently her hot flash seems to be worse than before. It makes her sleepless and feeling evil some time. She is frustrated about this issue and wants to get some new medications.  2) Anxiety: Patient reports that her anxiety has been worsening recently. She feels nervous and anxious all the time in the past 4-5 months. She was given a referral to behavior health in previous visit, but she didn't go, saying that her orange card was not accepted by the behavioral health Department. She keeps asking for Xanax prescription. She said Xanax was very effective for her anxiety in the past. Her current medications is not effective.  3). 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 She had cyst tapping in the past. Recently her synovial cyst becomes bigger. She feels pain in her right shoulder because of this synovial cyst.  4) Night cough: Patient reports that she has coughing at night. She feels having something in her throat in the night which induces coughing. She does not have chest pain, shortness of breath or palpitation. She doesn't have fever or chills.    Past Medical History  Diagnosis Date  . Fibroids     now s/p hysterectomy  . Left ventricular hypertrophy   . Polysubstance abuse     IV drug [place], cocaine on occassion, smoking and alcoholism, has been going to AA, not completely sober  . Hypertension   . bipolar   . Depression    Current  Outpatient Prescriptions  Medication Sig Dispense Refill  . atenolol (TENORMIN) 50 MG tablet Take 1 tablet (50 mg total) by mouth daily.  30 tablet  2  . FLUoxetine (PROZAC) 20 MG tablet Take 1 tablet (20 mg total) by mouth daily.  30 tablet  2  . haloperidol (HALDOL) 2 MG tablet Take 2 mg by mouth at bedtime.        . hydrOXYzine (ATARAX/VISTARIL) 25 MG tablet Take 25 mg by mouth 3 (three) times daily.       [name] lamoTRIgine (LAMICTAL) 200 MG tablet Take 1 tablet (200 mg total) by mouth daily.  30 tablet  6  . lisinopril-hydrochlorothiazide (ZESTORETIC) 20-12.5 MG per tablet Take 1 tablet by mouth daily.  90 tablet  4  . omeprazole (PRILOSEC) 20 MG capsule Take 1 capsule (20 mg total) by mouth 2 (two) times daily.  60 capsule  3  . DISCONTD: omeprazole (PRILOSEC) 20 MG capsule Take 1 capsule (20 mg total) by mouth 2 (two) times daily.  60 capsule  1  . ALPRAZolam (XANAX) 0.25 MG tablet Take 1 tablet (0.25 mg total) by mouth at bedtime as needed for anxiety.  15 tablet  0  . aspirin-acetaminophen-caffeine (EXCEDRIN MIGRAINE) 250-250-65 MG per tablet Take 2 tablets by mouth every 6 (six) hours as needed. For pain.       [name] estrogens, conjugated, (PREMARIN) 0.3 MG tablet Take 2 tablets (0.6 mg  total) by mouth daily. Take daily for 21 days then do not take for 7 days.  60 tablet  3  . sucralfate (CARAFATE) 1 G tablet Take 1 tablet (1 g total) by mouth 4 (four) times daily.  120 tablet  3  . DISCONTD: estrogens, conjugated, (PREMARIN) 0.3 MG tablet Take 1 tablet (0.3 mg total) by mouth daily. Take daily for 21 days then do not take for 7 days.  30 tablet  3  . DISCONTD: estrogens, conjugated, (PREMARIN) 0.3 MG tablet Take 2 tablets (0.6 mg total) by mouth daily. Take daily for 21 days then do not take for 7 days.  30 tablet  3  . DISCONTD: omeprazole (PRILOSEC) 20 MG capsule Take 1 capsule (20 mg total) by mouth 2 (two) times daily.  60 capsule  1  . DISCONTD: omeprazole (PRILOSEC) 20 MG capsule Take 2  tablets daily  60 capsule  6   Family History  Problem Relation Age of Onset  . Hypertension Mother   . Hypertension Sister   . Diabetes Father    History   Social History  . Marital Status: Single    Spouse Name: N/A    Number of Children: N/A  . Years of Education: N/A   Social History Main Topics  . Smoking status: Current Everyday Smoker -- 1.5 packs/day for 16 years    Types: Cigarettes  . Smokeless tobacco: Never Used   Comment: Wants to quit  . Alcohol Use: Yes     Occasional drinker  . Drug Use: No  . Sexually Active: Yes    Birth Control/ Protection: Surgical   Other Topics Concern  . None   Social History Narrative   Financial assistance approved for 100% discount at [facility] and has [facility] card per Gavin [place]12/04/2010   Review of Systems: General: no fevers, chills, no changes in body weight, no changes in appetite Skin: no rash HEENT: no blurry vision, hearing changes or sore throat. Pulm: no dyspnea, has night coughing, no wheezing CV: no chest pain, palpitations, shortness of breath Abd: no nausea/vomiting, abdominal pain, diarrhea/constipation GU: no dysuria, hematuria, polyuria Ext: no arthralgias, myalgias. Has right shoulder pain and a mass at anterior right shoulder.  Neuro: no weakness, numbness, or tingling   Objective:  Physical Exam: Filed Vitals:   02/26/12 1322  BP: 139/94  Pulse: 61  Temp: 97.2 F (36.2 C)  TempSrc: Oral  Height: 5\' 4"  (1.626 m)  Weight: 235 lb 6.4 oz (106.777 kg)   General: resting in bed, not in acute distress HEENT: PERRL, EOMI, no scleral icterus. Pharynx is not erythematous. There is no tonsillar enlargement. Cardiac: S1/S2, RRR, No murmurs, gallops or rubs Pulm: Good air movement bilaterally, Clear to auscultation bilaterally, No rales, wheezing, rhonchi or rubs. Abd: Soft,  nondistended, nontender, no rebound pain, no organomegaly, BS present Ext: No rashes or edema, 2+DP/PT pulse bilaterally. There is  tenderness over her right shoulder. There is a cyst-like mass at anterior right shoulder, 5 X 5 cm in size. There is no signs of infection. There is no warmth or swelling over right shoulder. Musculoskeletal: No joint deformities, erythema, or stiffness, ROM full and nontender Skin: no rashes. No skin bruise. Neuro: alert and oriented X3, cranial nerves II-XII grossly intact, muscle strength 5/5 in all extremeties,  sensation to light touch intact.  Psych.: patient is not psychotic, no suicidal or hemocidal ideation.    Assessment & Plan:

## 2012-02-26 NOTE — Assessment & Plan Note (Signed)
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 It is approximately 5 x 5 cm in size. We'll give referral to sports medicine.

## 2012-02-26 NOTE — Assessment & Plan Note (Signed)
Patient has night coughing and feeling something in her throat in the night. It is most likely caused by GERD. Her physical examination is negative in pharynx and the tonsils. Her lung auscultation is clear bilaterally. She reports that she takes Prilosec when she has heartburn. Patient was instructed to start taking Prilosec 20 mg twice a day regularly.

## 2012-03-04 ENCOUNTER — Telehealth: Payer: Self-pay | Admitting: Licensed Clinical Social Worker

## 2012-03-04 NOTE — Telephone Encounter (Signed)
Ms. Bin was referred to CSW for severe anxiety.  CSW placed call to pt to inquire if pt was able to make contact with [facility].  CSW placed call and was unable to leave message.  CSW will continue to follow and provide pt with information to [facility] and [facility].

## 2012-03-05 NOTE — Telephone Encounter (Signed)
CSW placed call to Ms. Gaynell Face.  Unable to leave message.

## 2012-03-05 NOTE — Telephone Encounter (Signed)
Pt returned call to CSW.  Ms. Avants states she is planning to utilized [facility] on Monday 7/29 during their walk-in hours.

## 2012-03-16 ENCOUNTER — Ambulatory Visit: Payer: Self-pay | Admitting: Family Medicine

## 2012-03-17 NOTE — Addendum Note (Signed)
Addended by: Dorie Rank E on: 03/17/2012 12:07 PM   Modules accepted: Orders

## 2012-03-19 ENCOUNTER — Encounter: Payer: Self-pay | Admitting: Licensed Clinical Social Worker

## 2012-03-19 NOTE — Telephone Encounter (Signed)
Ms. Scarfo returned call to this CSW, stating her current number is out of minutes.  Pt current with therapy at [facility] [place].  [facility] no [facility] offers [facility]ry.  Pt states she is having difficulty with [facility]'s walk-in hours.  CSW discussed possibility of pt scheduling appt for psychiatrist at [facility].  CSW placed call to [facility], confirmed pt can schedule appt for psychiatrist as long as they are in therapy either at [facility] or another agency.  Pt to come in to office on Monday, 03/22/12 to see CSW.  CSW provided pt with hours for that day.

## 2012-03-19 NOTE — Telephone Encounter (Signed)
Placed call to Ms. Gendron to discuss signing ROI with [facility].  Unable to leave message.  CSW will mail ROI with letter to Ms. Gaynell Face.

## 2012-03-25 ENCOUNTER — Encounter: Payer: Self-pay | Admitting: Licensed Clinical Social Worker

## 2012-03-25 ENCOUNTER — Telehealth: Payer: Self-pay | Admitting: Licensed Clinical Social Worker

## 2012-03-25 ENCOUNTER — Other Ambulatory Visit: Payer: Self-pay | Admitting: *Deleted

## 2012-03-25 DIAGNOSIS — F419 Anxiety disorder, unspecified: Secondary | ICD-10-CM

## 2012-03-25 NOTE — Progress Notes (Signed)
Jennifer [name] presents to Triage RN for assistance with obtaining her Xanax.  Pt states she is unable to afford the copay using her [facility] card and is in need of transportation to pick up prescriptions.  Jennifer [name] also inquires about CSW gift card for grocery store, CSW offered access to [facility] pantry.  Pt states "I need some meat".  CSW inquired if pt had a chance to call [facility], pt had not but still sees therapist at [facility].  CSW placed call to [facility], this time CSW informed pt must present as walk-in to see psychiatrist.  Contradictory to previous information from [place].  CSW encouraged Jennifer [name] to present at [facility] at 8:00 am, as it is first come basis.  There also is on-site pharmacy, [facility] - minimum cost would be $10 for controlled substance. CSW provided Jennifer [name] with $5 for Xanax copay at [facility] and one bus fare ticket. Pt aware of local hot meal locations.  Pt denies add'l needs but request CSW to hold a gift card for her if CSW obtains access to any.

## 2012-03-25 NOTE — Telephone Encounter (Signed)
Called pt but not able to get in touch with her.

## 2012-03-25 NOTE — Telephone Encounter (Signed)
A user error has taken place: encounter opened in error, closed for administrative reasons.

## 2012-03-25 NOTE — Telephone Encounter (Signed)
Pt came to clinic asking for a refill on xanax.   She has not been able to get in with [facility] yet and needs enough to last until she is seen there.

## 2012-04-02 NOTE — Telephone Encounter (Signed)
Pt in to office in inquire if Xanax was called into [facility].  Pt requesting to speak with CSW, refill RN on the phone at this time.  CSW informed pt, EMR indicates physician declined to refill prescription.  CSW instructed pt to speak with triage RN or refill RN to obtain additional information.  Pt declined.  CSW inquired if pt had an opportunity to go to [place], pt response "it takes money to do all that rippin and runnin".

## 2012-04-13 ENCOUNTER — Ambulatory Visit (INDEPENDENT_AMBULATORY_CARE_PROVIDER_SITE_OTHER): Payer: Self-pay | Admitting: Internal Medicine

## 2012-04-13 ENCOUNTER — Encounter: Payer: Self-pay | Admitting: Internal Medicine

## 2012-04-13 VITALS — BP 146/97 | HR 85 | Temp 98.8°F | Ht 64.0 in | Wt 240.4 lb

## 2012-04-13 DIAGNOSIS — R232 Flushing: Secondary | ICD-10-CM

## 2012-04-13 DIAGNOSIS — N951 Menopausal and female climacteric states: Secondary | ICD-10-CM

## 2012-04-13 DIAGNOSIS — I10 Essential (primary) hypertension: Secondary | ICD-10-CM

## 2012-04-13 DIAGNOSIS — F3112 Bipolar disorder, current episode manic without psychotic features, moderate: Secondary | ICD-10-CM

## 2012-04-13 MED ORDER — ESTROGENS CONJUGATED 0.3 MG PO TABS: 0.6000 mg | ORAL_TABLET | Freq: Every day | ORAL | Status: DC

## 2012-04-13 MED ORDER — LISINOPRIL-HYDROCHLOROTHIAZIDE 20-12.5 MG PO TABS: 1.0000 | ORAL_TABLET | Freq: Every day | ORAL | Status: DC

## 2012-04-13 MED ORDER — QUETIAPINE FUMARATE 50 MG PO TABS: 50.0000 mg | ORAL_TABLET | Freq: Every day | ORAL | Status: DC

## 2012-04-13 MED ORDER — QUETIAPINE FUMARATE ER 50 MG PO TB24: 50.0000 mg | ORAL_TABLET | Freq: Every day | ORAL | Status: DC

## 2012-04-13 NOTE — Progress Notes (Signed)
HPI:Jennifer [name] is a 51 yo woman with PMH of bipolar, anxiety presents today for follow up. Patient is still having panic attacks and has not been seen by [facility] yet.  She called for refill on Xanax last month and it was denied because per Dr. Evorn Gong note who discussed case with Dr. Aundria Rud in July 2013, we will not be prescribing her Xanax and she has to be seen by [facility] or another psych facility.  She has a lot of stuff going on in her life, she states "she is on the edge".  She denies any suicidal/homicidal/hallucinations.  Her thoughts are running fast, not sleeping at night, wake up every 15 minutes, very irritated.  She is not taking any meds right now because she ran out of meds and the program she is in right now runs out of funding.  She is having hotflashes, lost her Rx for Premarin so she has not been taking in. Last office visit, Dr. Clyde Lundborg increased her Premarin to 0.6mg .    ROS: as per HPI  PE: General: alert, well-developed, and cooperative to examination.  Head: normocephalic and atraumatic.  Eyes: vision grossly intact, pupils equal, pupils round, pupils reactive to light, no injection and anicteric.  Mouth: pharynx pink and moist, no erythema, and no exudates.  Neck: supple, full ROM, no thyromegaly, no JVD, and no carotid bruits.  Lungs: normal respiratory effort, no accessory muscle use, normal breath sounds, no crackles, and no wheezes. Heart: normal rate, regular rhythm, no murmur, no gallop, and no rub.  Abdomen: soft, non-tender, normal bowel sounds, no distention, no guarding, no rebound tenderness, no hepatomegaly, and no splenomegaly.  Msk: no joint swelling, no joint warmth, and no redness over joints.  Neurologic: alert & oriented X3, cranial nerves II-XII intact, strength normal in all extremities, sensation intact to light touch, and gait normal.  Skin: turgor normal and no rashes.  Psych: Oriented X3, memory intact for recent and remote, normally interactive, good eye  contact, anxious

## 2012-04-13 NOTE — Patient Instructions (Signed)
Please make sure you follow up with Mental Health Start taking Seroquel 50mg  XR at bedtime Follow up with Dr. Anselm Jungling in 2 wks Resume your blood pressure medications

## 2012-04-13 NOTE — Assessment & Plan Note (Signed)
Not well-controlled, she has not been taking her BP meds. -Will resume BP meds of lisinopril/hctz and atenolol  -Will recheck BP in 2 wks

## 2012-04-13 NOTE — Assessment & Plan Note (Signed)
New Rx given: premarin 0.6mg  since patient lost her Rx from last office visit.

## 2012-04-13 NOTE — Assessment & Plan Note (Signed)
She is currently hypomanic and is very irritated.  She was supposed to be on Prozac, Haldol, Lamictal and Seroquel but she has not been taking any of her meds because she ran out of her meds.  She is requesting to restart her Seroquel because she feels like it really helped her, asking for samples but we do not have any. -Will resume Seroquel 50mg  XR qhs -Will have close follow up in 2 weeks. -With her drug abuse history, I do not feel comfortable prescribing Xanax. -I strongly encouraged her to follow up with mental health because she is very complex and needs to be managed by psych  Case discussed with Dr. Eben Burow

## 2012-04-14 ENCOUNTER — Telehealth: Payer: Self-pay | Admitting: Licensed Clinical Social Worker

## 2012-04-14 NOTE — Telephone Encounter (Signed)
Jennifer [name] was referred to CSW to assist with appt at [facility].  Pt is well known to this CSW.  CSW has had multiple conversations with Jennifer [name] about the walk-in hours at both [place] and [address] of the [place].  Pt would benefit from continued sessions with her therapist at [facility] the [place] as they have just hired new psychiatrist.  Pt aware she is unable to make appt at [facility] and must use their walk-in times to see therapist and psychiatrist.  CSW unable to facilitate appt for Jennifer [name] as pt does not have insurance, such her options are quite limited to [employer] of [place] and [place].

## 2012-04-15 ENCOUNTER — Encounter: Payer: Self-pay | Admitting: Licensed Clinical Social Worker

## 2012-04-15 ENCOUNTER — Telehealth: Payer: Self-pay | Admitting: Licensed Clinical Social Worker

## 2012-04-15 NOTE — Telephone Encounter (Signed)
CSW recently learned [facility] [place] now has a Teacher, early years/pre.  CSW placed call to [facility] in schedule appt for pt.  CSW left message, awaiting return call.

## 2012-04-19 NOTE — Telephone Encounter (Signed)
CSW spoke with Intake Supervisor, Jill Alexanders x [phone] at [facility].  Pt has not been seen at [facility] in over one year.  Ms. Loria must re-start services through the walk-in clinic hours.  Pt is unable to schedule appointment with psychiatrist due to not seeing therapist in over one year.

## 2012-05-04 ENCOUNTER — Ambulatory Visit (INDEPENDENT_AMBULATORY_CARE_PROVIDER_SITE_OTHER): Payer: Self-pay | Admitting: Family Medicine

## 2012-05-04 VITALS — BP 144/94 | Ht 64.0 in | Wt 240.0 lb

## 2012-05-04 DIAGNOSIS — D1739 Benign lipomatous neoplasm of skin and subcutaneous tissue of other sites: Secondary | ICD-10-CM

## 2012-05-04 MED ORDER — METHYLPREDNISOLONE ACETATE 40 MG/ML IJ SUSP
40.0000 mg | Freq: Once | INTRAMUSCULAR | Status: AC
  Administered 2012-05-04: 40 mg via INTRAMUSCULAR

## 2012-05-04 NOTE — Progress Notes (Signed)
Subjective: 51 year old female coming in with right shoulder pain. Patient states that she has had this on and off for quite some time. Patient states that this has been over the course of a year or 2. Patient states that she had swelling previously and had somebody attempt to try to get fluid out of her shoulder but to no avail. Patient states that recently though the area has grown in size, and has caused her much more pain recently. Patient states it hurts more with certain movements as well as sleeping at night. Patient also states it's tender to touch. Patient states on the pain is on the anterior aspect of her shoulder where there is swelling. Patient describes the pain is chronic dull ache with some sharp features. Patient has taken Percocet in the past which was beneficial she states. Patient states this does have some radiation down her arm but no weakness.  Past Medical History  Diagnosis Date  . Fibroids     now s/p hysterectomy  . Left ventricular hypertrophy   . Polysubstance abuse     IV drug [place], cocaine on occassion, smoking and alcoholism, has been going to AA, not completely sober  . Hypertension   . bipolar   . Depression    Past Surgical History  Procedure Date  . Abdominal hysterectomy     2003  . Exploratory laparotomy     complex pelvic mass 2011  . Bilateral salpingoophorectomy     01/2010   History   Social History  . Marital Status: Single    Spouse Name: N/A    Number of Children: N/A  . Years of Education: N/A   Occupational History  . Not on file.   Social History Main Topics  . Smoking status: Current Every Day Smoker -- 1.5 packs/day for 16 years    Types: Cigarettes  . Smokeless tobacco: Never Used   Comment: Wants to quit  . Alcohol Use: Yes     Occasional drinker  . Drug Use: No  . Sexually Active: Yes    Birth Control/ Protection: Surgical   Other Topics Concern  . Not on file   Social History Narrative   Financial assistance approved  for 100% discount at [facility] and has [facility] card per Gavin [place]12/04/2010   Family History  Problem Relation Age of Onset  . Hypertension Mother   . Hypertension Sister   . Diabetes Father     Physical exam Filed Vitals:   05/04/12 1453  BP: 144/94   General: No apparent distress patient does have some pressured speech. Patient is alert and oriented x3 mood and affect is normal Right upper tremor he: Patient does on inspection have fullness of her anterior shoulder on palpation this appears to be cystic versus lipoma in nature. This is minimal tender to palpation. This measures approximately 3-4 cm x 2 cm. Appears to be very superficial and most corresponding with a lipoma. Patient is neurovascularly intact distally. Shoulder:Right IROM is full in all planes. Rotator cuff strength normal throughout. No signs of impingement with negative Neer and Hawkin's tests, empty can. Speeds and Yergason's tests normal. No labral pathology noted with negative Obrien's, negative clunk and good stability. Normal scapular function observed. No painful arc and no drop arm sign. No apprehension sign Negative Spurling's test of the neck and full active range of motion of her neck.  Ultrasound was done, and interpreted by me today. Patient's ultrasound showed that she does have what appears to be a  subcutaneous sac on the anterior aspect of her shoulder that does not communicate with the joint space. Minimal fluid and mostly fatty tissue. Otherwise shoulder ultrasound including rotator cuff and biceps tendon is unremarkable.

## 2012-05-04 NOTE — Assessment & Plan Note (Signed)
Patient has what appears to be a lipoma on the anterior aspect of the right shoulder. This seems to be large and giving her pain most of the time as well as waking up at night. Patient will be referred to general surgeon for evaluation in potential removal. Patient has an appointment are to set up on May 17, 2012. Patient was given this information. Patient is still complaining of pain and unable to take anti-inflammatories. Patient was given one shot of 40 mg of Depo-Medrol. Did not want to give patient any other narcotics secondary to her history of abuse. Patient will followup with primary care provider if she needs any other pain medication but likely will have no pain once the lipoma is dealt with.

## 2012-05-04 NOTE — Patient Instructions (Addendum)
You have been scheduled for an appointment at [facility] for Monday 05/17/12- please arrive at 1:50pm for a 2:20 pm appointment With Dr. Derrell Lolling [address] [place] [address] [phone]   Very nice to meet you. We are going to refer you to general surgery. They will take care of this lipoma on your shoulder. Because of all your allergies the only medicine and we can give you for pain is an injection of steroids today. I hope this helps. Please see general surgery as well as your primary care in the near future.  Lipoma A lipoma is a noncancerous (benign) tumor composed of fat cells. They are usually found under the skin (subcutaneous). A lipoma may occur in any tissue of the body that contains fat. Common areas for lipomas to appear include the back, shoulders, buttocks, and thighs. Lipomas are a very common soft tissue growth. They are soft and grow slowly. Most problems caused by a lipoma depend on where it is growing. DIAGNOSIS  A lipoma can be diagnosed with a physical exam. These tumors rarely become cancerous, but radiographic studies can help determine this for certain. Studies used may include:  Computerized X-ray scans (CT or CAT scan).   Computerized magnetic scans (MRI).  TREATMENT  Small lipomas that are not causing problems may be watched. If a lipoma continues to enlarge or causes problems, removal is often the best treatment. Lipomas can also be removed to improve appearance. Surgery is done to remove the fatty cells and the surrounding capsule. Most often, this is done with medicine that numbs the area (local anesthetic). The removed tissue is examined under a microscope to make sure it is not cancerous. Keep all follow-up appointments with your caregiver. SEEK MEDICAL CARE IF:   The lipoma becomes larger or hard.   The lipoma becomes painful, red, or increasingly swollen. These could be signs of infection or a more serious condition.  Document  Released: 07/18/2002 Document Revised: 07/17/2011 Document Reviewed: 12/28/2009 [facility] Patient Information 2012 [place], [place].

## 2012-05-10 ENCOUNTER — Emergency Department (INDEPENDENT_AMBULATORY_CARE_PROVIDER_SITE_OTHER)
Admission: EM | Admit: 2012-05-10 | Discharge: 2012-05-10 | Disposition: A | Discharge status: Home / Self Care | Payer: Self-pay | Source: Home / Self Care | Attending: Family Medicine | Admitting: Family Medicine

## 2012-05-10 ENCOUNTER — Encounter (HOSPITAL_COMMUNITY): Payer: Self-pay

## 2012-05-10 DIAGNOSIS — K089 Disorder of teeth and supporting structures, unspecified: Secondary | ICD-10-CM

## 2012-05-10 DIAGNOSIS — K0889 Other specified disorders of teeth and supporting structures: Secondary | ICD-10-CM

## 2012-05-10 MED ORDER — CLINDAMYCIN HCL 150 MG PO CAPS: 150.0000 mg | ORAL_CAPSULE | Freq: Four times a day (QID) | ORAL | Status: DC

## 2012-05-10 MED ORDER — ACETAMINOPHEN-CODEINE #3 300-30 MG PO TABS: 1.0000 | ORAL_TABLET | Freq: Four times a day (QID) | ORAL | Status: DC | PRN

## 2012-05-10 NOTE — ED Notes (Signed)
Patient out in hallway yelling stating she needed her pain medicine.  She was ready to go.  Patient then wanting to know if this medicine was better than tramadol.  I informed patient that it was a different formula so i may work differently for her.  Patient continues to be aggitated.  I tried to explain to the patient that she needed to make sure she filled the antibiotic or the infection would nto go away.  Patient expressed understanding and Lupita Leash finished discharge instructions with patient

## 2012-05-10 NOTE — ED Provider Notes (Signed)
History     CSN: [id number]  Arrival date & time 05/10/12  1351   First MD Initiated Contact with Patient 05/10/12 1352      Chief Complaint  Patient presents with  . Dental Pain    (Consider location/radiation/quality/duration/timing/severity/associated sxs/prior treatment) Patient is a 51 y.o. female presenting with tooth pain. The history is provided by the patient.  Dental PainThe primary symptoms include mouth pain. The symptoms began 2 days ago. The symptoms are worsening. The symptoms are new.  Additional symptoms include: gum swelling, gum tenderness, jaw pain and facial swelling. Additional symptoms do not include: trouble swallowing and pain with swallowing. Medical issues include: periodontal disease.    Past Medical History  Diagnosis Date  . Fibroids     now s/p hysterectomy  . Left ventricular hypertrophy   . Polysubstance abuse     IV drug [place], cocaine on occassion, smoking and alcoholism, has been going to AA, not completely sober  . Hypertension   . bipolar   . Depression     Past Surgical History  Procedure Date  . Abdominal hysterectomy     2003  . Exploratory laparotomy     complex pelvic mass 2011  . Bilateral salpingoophorectomy     01/2010    Family History  Problem Relation Age of Onset  . Hypertension Mother   . Hypertension Sister   . Diabetes Father     History  Substance Use Topics  . Smoking status: Current Every Day Smoker -- 1.5 packs/day for 16 years    Types: Cigarettes  . Smokeless tobacco: Never Used   Comment: Wants to quit  . Alcohol Use: Yes     Occasional drinker    OB History    Grav Para Term Preterm Abortions TAB SAB Ect Mult Living   5 0 0 0 4 0 1 0 0 0       Review of Systems  Constitutional: Negative.   HENT: Positive for facial swelling and dental problem. Negative for trouble swallowing.     Allergies  Ibuprofen  Home Medications   Current Outpatient Rx  Name Route Sig Dispense Refill  .  ACETAMINOPHEN-CODEINE #3 300-30 MG PO TABS Oral Take 1 tablet by mouth every 6 (six) hours as needed for pain. 15 tablet 0  . ATENOLOL 50 MG PO TABS Oral Take 1 tablet (50 mg total) by mouth daily. 30 tablet 2  . CLINDAMYCIN HCL 150 MG PO CAPS Oral Take 1 capsule (150 mg total) by mouth 4 (four) times daily. 28 capsule 0  . ESTROGENS CONJUGATED 0.3 MG PO TABS Oral Take 2 tablets (0.6 mg total) by mouth daily. Take daily for 21 days then do not take for 7 days. 60 tablet 3  . LISINOPRIL-HYDROCHLOROTHIAZIDE 20-12.5 MG PO TABS Oral Take 1 tablet by mouth daily. 90 tablet 4  . QUETIAPINE FUMARATE ER 50 MG PO TB24 Oral Take 1 tablet (50 mg total) by mouth daily. 30 each 0    BP 181/105  Pulse 62  Temp 98.6 F (37 C) (Oral)  Resp 18  SpO2 98%  LMP 12/04/2009  Physical Exam  Nursing note and vitals reviewed. Constitutional: She is oriented to person, place, and time. She appears well-developed and well-nourished. She appears distressed.  HENT:  Head: Normocephalic.  Mouth/Throat: Oropharynx is clear and moist.    Neck: Normal range of motion. Neck supple.  Lymphadenopathy:    She has no cervical adenopathy.  Neurological: She is alert and oriented to  person, place, and time.  Skin: Skin is warm and dry.    ED Course  Procedures (including critical care time)  Labs Reviewed - No data to display No results found.   1. Pain, dental       MDM          Linna Hoff, MD 05/10/12 1534

## 2012-05-10 NOTE — ED Notes (Signed)
Patient complains of dental pain x 2 days

## 2012-05-17 ENCOUNTER — Ambulatory Visit (INDEPENDENT_AMBULATORY_CARE_PROVIDER_SITE_OTHER): Payer: Self-pay | Admitting: General Surgery

## 2012-05-19 ENCOUNTER — Encounter: Payer: Self-pay | Admitting: Internal Medicine

## 2012-05-19 ENCOUNTER — Ambulatory Visit (INDEPENDENT_AMBULATORY_CARE_PROVIDER_SITE_OTHER): Payer: Self-pay | Admitting: Internal Medicine

## 2012-05-19 ENCOUNTER — Encounter: Payer: Self-pay | Admitting: *Deleted

## 2012-05-19 ENCOUNTER — Encounter: Payer: Self-pay | Admitting: Licensed Clinical Social Worker

## 2012-05-19 VITALS — BP 151/100 | HR 87 | Temp 98.0°F | Ht 64.0 in | Wt 237.3 lb

## 2012-05-19 DIAGNOSIS — I10 Essential (primary) hypertension: Secondary | ICD-10-CM

## 2012-05-19 DIAGNOSIS — F3112 Bipolar disorder, current episode manic without psychotic features, moderate: Secondary | ICD-10-CM

## 2012-05-19 DIAGNOSIS — F3189 Other bipolar disorder: Secondary | ICD-10-CM

## 2012-05-19 DIAGNOSIS — F411 Generalized anxiety disorder: Secondary | ICD-10-CM

## 2012-05-19 DIAGNOSIS — Z9189 Other specified personal risk factors, not elsewhere classified: Secondary | ICD-10-CM

## 2012-05-19 DIAGNOSIS — F419 Anxiety disorder, unspecified: Secondary | ICD-10-CM

## 2012-05-19 DIAGNOSIS — K029 Dental caries, unspecified: Secondary | ICD-10-CM

## 2012-05-19 DIAGNOSIS — K0889 Other specified disorders of teeth and supporting structures: Secondary | ICD-10-CM | POA: Insufficient documentation

## 2012-05-19 MED ORDER — ZOLPIDEM TARTRATE 10 MG PO TABS: 10.0000 mg | ORAL_TABLET | Freq: Every day | ORAL | Status: DC

## 2012-05-19 MED ORDER — HYDROCODONE-ACETAMINOPHEN 5-325 MG PO TABS: 1.0000 | ORAL_TABLET | Freq: Four times a day (QID) | ORAL | Status: DC | PRN

## 2012-05-19 NOTE — Patient Instructions (Signed)
It is very important to take the antibiotics that Urgent Care prescribed today (Clindamycin). I gave you a prescription for hydrocodone and Ambien which should help with your pain and sleep. Follow-up with [facility] and the [facility] as soon as possible for your racing thoughts and anxiety. In addition, it is important to go see a dentist.  If you develop a dental abscess it could be very dangerous and possibly spread to your brain.

## 2012-05-19 NOTE — Progress Notes (Signed)
Needs to discuss this in appointment. Thanks.

## 2012-05-19 NOTE — Progress Notes (Signed)
S: Jennifer [name] walks in to [facility] today requesting to speak with CSW.  Pt states "Jennifer [name] will not prescribe me Xanax".  Pt states she was told by [facility] and [facility] of the [place] that their psychiatrist do not prescribe "the kind of Xanax the doctors here give me".  Pt did not stay or was not seen by any psychiatrist, was not offered any alternative medication by a physician. Pt requesting listing of other Primary Care offices she can go to using the [facility]. Pt states her [facility] card has expired and can not obtain her medications.  Pt requesting CSW to call family member to provide information as to why letter is needed to determine her eligibility for [facility].   O: Pt wanting refill of her Xanax and Tylenol for her tooth.  Pt requesting food or voucher for [employer].  A: Jennifer [name] has allowed her [employer] Card to expire and needs to re-apply and bring in necessary documentation.  CSW assisted pt by calling family member to provide information as to why letter regarding pt's homelessness helps determine eligibility, pt signed ROI for CSW to speak with family member.    Encouraged pt to see Triage RN if tooth was causing pain she needed medication.  Jennifer [name] states she can not purchase her atb for her tooth.  CSW informed Jennifer [name] offers free atb with prescription and Jennifer [name] card.   Pt signed ROI for CSW to speak with aunt, Jennifer [name].  CSW offered pt food from [facility] pantry.  Pt states [facility] pantry offers no meat, CSW informed Jennifer [name] tuna was available.  Pt declined food from [facility] pantry.  CSW informed Jennifer [name] CSW does not have access to food vouchers for [employer].  Pt speech was pressured, rapid and tangential.    P: CSW provided Jennifer [name] listing of other Primary Care agencies in the area.  Encouraged pt to see Triage RN regarding needed refills and tooth pain.  Pt should go to [employer] for free atb.  Pt should go to [facility] to assist  with free medications if eligible.  Pt needs to be linked with a psychiatrist even if told they do not prescribe the medication she desires.

## 2012-05-19 NOTE — Progress Notes (Unsigned)
Pt presents wanting me to give her "samples" of pain medicine and her medicine for panic attacks and abx for a tooth. i have informed her that this is impossible that the clinic does not have samples of these meds, she insists that the clinic does, she states she has been given these before in a little pack. i explained that this may have been when she had an appt and a one time dose was given from the pharmacy but that she wasn't given more to take home, she agreed that this was the case but i should be able to give her some to take home, i told her no that that is not clinic policy, she started to walk out but was offered an appt, she ask if at this appt she would be given these drugs and i stressed again that there were none to give. appt 1315 dr schooler per sharonb. i have also spoken to shanag. Pt is being seen for a toothache. i explained to her that possibly if she gets tooth pain under control that the anxiety will lessen or diminish but that the physician could discuss that with her

## 2012-05-19 NOTE — Progress Notes (Signed)
  Subjective:    [name], female    DOB: 12/17/1960, 51 y.o.   MRN: [id number]  HPI Pt with Bipolar disorder, Depression and ANxiety presents acutely for tooth pain and anxiety.  States that urgent care gave her antibiotics which she has not filled yet and requesting "something for the pain and my racing thoughts and anxiety".  Pt specifically requesting Xanax.  Of note, she was recently evaluated in clinic early September and advised to seek psychiatry at [facility] for further management.  Pt states that "they only want to do therapy" and was told that they no longer prescribe Xanax.     Review of Systems  Constitutional: Negative for fever.  HENT: Negative for facial swelling.        Tooth pain  Respiratory: Negative for shortness of breath.   Cardiovascular: Negative for chest pain.  Neurological: Positive for syncope, numbness and headaches.  Psychiatric/Behavioral: Positive for dysphoric mood and agitation.       Objective:   Physical Exam  Constitutional: She appears well-developed and well-nourished.  HENT:  Head: Normocephalic and atraumatic.  Mouth/Throat: Oropharynx is clear and moist and mucous membranes are normal. Abnormal dentition. Dental caries present. No uvula swelling.    Psychiatric: Her mood appears anxious. Her affect is angry. Her speech is rapid and/or pressured. She is agitated, aggressive and is hyperactive. She expresses impulsivity and inappropriate judgment. She expresses no suicidal plans and no homicidal plans.          Assessment & Plan:  1. Dental caries with question of abscesses: pt encouraged to fill and take Clindamycin that was prescribed per Urgent care, in addition to seek dental care, pt stated that she will go to "dentist on [address]", educated about increased risk for brain involvement if abscess develop and go without treatment, pt voiced understanding  2. Anxiety: Although initially very hostile and demanding, it was  made clear to pt that her Bipolar and anxiety needed to be managed by [facility]. Furthermore she would not receive both anxiolytics and sleep aids.  She decline Buspar in favor of re-fill of previous Ambien prescription to help her sleep.    3. Bipolar d/o: uncontrolled, pt strongly encouraged to resume Seroquel to help with her raising thoughts, no clear agreement that she will follow this advise  4. Disposition: pt stated that she will be attending the [facility] for further care

## 2012-05-21 ENCOUNTER — Ambulatory Visit: Payer: Self-pay | Admitting: Internal Medicine

## 2012-05-26 ENCOUNTER — Other Ambulatory Visit: Payer: Self-pay | Admitting: *Deleted

## 2012-05-26 MED ORDER — ATENOLOL 50 MG PO TABS: 50.0000 mg | ORAL_TABLET | Freq: Every day | ORAL | Status: DC

## 2012-05-26 NOTE — Telephone Encounter (Signed)
Pt is completely out

## 2012-05-26 NOTE — Telephone Encounter (Signed)
Atenolol rx called to [facility].

## 2012-05-27 ENCOUNTER — Telehealth: Payer: Self-pay | Admitting: *Deleted

## 2012-05-27 ENCOUNTER — Ambulatory Visit: Payer: Self-pay | Admitting: Internal Medicine

## 2012-05-27 NOTE — Telephone Encounter (Signed)
Pt is here at the clinic. Inform her Atenolol rx was called in to her pharmacy yesterday afternoon. States she has another appt this afternoon but wants her BP checked; BP- 127/88 and P- 74.

## 2012-05-28 ENCOUNTER — Encounter (INDEPENDENT_AMBULATORY_CARE_PROVIDER_SITE_OTHER): Payer: Self-pay | Admitting: General Surgery

## 2012-06-01 ENCOUNTER — Ambulatory Visit: Payer: Self-pay | Admitting: Internal Medicine

## 2012-07-26 ENCOUNTER — Ambulatory Visit (INDEPENDENT_AMBULATORY_CARE_PROVIDER_SITE_OTHER): Payer: No Typology Code available for payment source | Admitting: Internal Medicine

## 2012-07-26 ENCOUNTER — Encounter: Payer: Self-pay | Admitting: Internal Medicine

## 2012-07-26 VITALS — BP 149/93 | HR 69 | Temp 97.8°F | Ht 64.0 in | Wt 234.5 lb

## 2012-07-26 DIAGNOSIS — R609 Edema, unspecified: Secondary | ICD-10-CM

## 2012-07-26 DIAGNOSIS — Z23 Encounter for immunization: Secondary | ICD-10-CM

## 2012-07-26 DIAGNOSIS — M79604 Pain in right leg: Secondary | ICD-10-CM | POA: Insufficient documentation

## 2012-07-26 DIAGNOSIS — G4700 Insomnia, unspecified: Secondary | ICD-10-CM

## 2012-07-26 DIAGNOSIS — Z0000 Encounter for general adult medical examination without abnormal findings: Secondary | ICD-10-CM

## 2012-07-26 DIAGNOSIS — F419 Anxiety disorder, unspecified: Secondary | ICD-10-CM

## 2012-07-26 MED ORDER — DIPHENHYDRAMINE HCL (SLEEP) 50 MG PO TABS: 50.0000 mg | ORAL_TABLET | Freq: Every evening | ORAL | Status: DC | PRN

## 2012-07-26 MED ORDER — IBUPROFEN 800 MG PO TABS: 800.0000 mg | ORAL_TABLET | Freq: Four times a day (QID) | ORAL | Status: DC | PRN

## 2012-07-26 NOTE — Progress Notes (Signed)
  Subjective:    [name], female    DOB: 1961/05/03, 51 y.o.   MRN: [id number]  HPI Hx significant for anxiety, bipolar d/o, hypertension, and insomnia.  Presents today with complaints of bilateral thigh pain since starting her her new job whereby she needs to stand for hours at a time.  Also is requesting something for sleep as she states the pain is worsening her insomnia.   Review of Systems As per HPI    Objective:   Physical Exam  Constitutional: She is oriented to person, place, and time. She appears well-developed and well-nourished. No distress.  HENT:  Head: Normocephalic and atraumatic.  Musculoskeletal: Normal range of motion. She exhibits tenderness. She exhibits no edema.       Left hip: She exhibits tenderness. She exhibits normal range of motion, normal strength, no bony tenderness, no swelling and no crepitus.       Legs: Neurological: She is alert and oriented to person, place, and time.  Psychiatric: Her behavior is normal. Thought content normal. Her mood appears anxious. Her affect is labile. Her speech is rapid and/or pressured. Cognition and memory are normal. She expresses impulsivity.          Assessment & Plan:  1. Bilateral leg pain: appears musculoskeletal in nature,  -ibuprofen 800 mg prn  2. Insomnia: chronic -benadryl 50 mg prn  3. Preventative Care: flu shot today

## 2012-07-26 NOTE — Patient Instructions (Addendum)
For your leg pain, take the prescribed pain medicine (ibuprofen) as instruceted to help with the inflammation and muscle aches from standing for hours at a time. For your difficulty sleeping, take the sleep medicine (diphenhydramine) about 30 minutes before bed. A note was provided stating that you may resume work. Follow-up with your Primary Doctor.

## 2012-08-16 ENCOUNTER — Ambulatory Visit (INDEPENDENT_AMBULATORY_CARE_PROVIDER_SITE_OTHER): Payer: No Typology Code available for payment source | Admitting: Internal Medicine

## 2012-08-16 VITALS — BP 152/102 | HR 63 | Temp 98.5°F | Ht 64.0 in | Wt 240.3 lb

## 2012-08-16 DIAGNOSIS — R05 Cough: Secondary | ICD-10-CM | POA: Insufficient documentation

## 2012-08-16 DIAGNOSIS — J029 Acute pharyngitis, unspecified: Secondary | ICD-10-CM

## 2012-08-16 DIAGNOSIS — F419 Anxiety disorder, unspecified: Secondary | ICD-10-CM

## 2012-08-16 DIAGNOSIS — J300 Vasomotor rhinitis: Secondary | ICD-10-CM

## 2012-08-16 DIAGNOSIS — Z113 Encounter for screening for infections with a predominantly sexual mode of transmission: Secondary | ICD-10-CM

## 2012-08-16 DIAGNOSIS — Z202 Contact with and (suspected) exposure to infections with a predominantly sexual mode of transmission: Secondary | ICD-10-CM

## 2012-08-16 DIAGNOSIS — J309 Allergic rhinitis, unspecified: Secondary | ICD-10-CM

## 2012-08-16 MED ORDER — FLUTICASONE PROPIONATE 50 MCG/ACT NA SUSP: 2.0000 | Freq: Every day | NASAL | Status: DC

## 2012-08-16 MED ORDER — CHLORPHENIRAMINE MALEATE 4 MG PO TABS: 4.0000 mg | ORAL_TABLET | Freq: Four times a day (QID) | ORAL | Status: DC | PRN

## 2012-08-16 NOTE — Patient Instructions (Addendum)
Take the chlorpheneramine as prescribed.  It will dry up your nose secretions and help the cough from your post-nasal drip.  It may make you drowsy so follow the cautions on the bottle.  We have also prescribed a nasal spray to help with the post nasal drip.    As for the STD testing, we will have the results in a few days. You may call the clinic since your phone is not working currently.

## 2012-08-16 NOTE — Progress Notes (Signed)
  Subjective:    [name], female    DOB: [birth year], 52 y.o.   MRN: [id number]  HPI  Seen acutely today as add-on to schedule with c/o cough mostly non-productive for the past month or so. States that she was being treated for GERD but doesn't think reflux is the problem. Hx significant for anxiety, bipolar disorder, GERD, hypertension with unclear compliance with lisinopril therapy as bp remains elevated 149-152/93-104 on past 3 office visits and [facility] confirmed pt has NOT filled lisinopril-hct.  In addition pt request to be tested "for STDs" because the condom "broke".  No complaints of vaginal irritation, discharge, odor, bleeding or dysuria.  No knowledge that partner HIV positive or has an STD.  Of note, she has had a variety of testing for STDs in the past and positive for Trichomonas.  Review of Systems  Constitutional: Negative for fever, chills, diaphoresis and fatigue.  HENT: Positive for rhinorrhea and postnasal drip. Negative for congestion and sneezing.   Respiratory: Positive for cough. Negative for chest tightness, shortness of breath, wheezing and stridor.   Cardiovascular: Negative for chest pain.  Neurological: Negative for light-headedness and headaches.       Objective:   Physical Exam  Constitutional: She is oriented to person, place, and time. She appears well-developed and well-nourished. No distress.  HENT:  Head: Normocephalic and atraumatic.  Nose: Mucosal edema and rhinorrhea present. No sinus tenderness, nasal deformity or septal deviation. No epistaxis. Right sinus exhibits no maxillary sinus tenderness and no frontal sinus tenderness. Left sinus exhibits no maxillary sinus tenderness and no frontal sinus tenderness.    Mouth/Throat: No oropharyngeal exudate, posterior oropharyngeal edema, posterior oropharyngeal erythema or tonsillar abscesses.    Cardiovascular: Normal rate, regular rhythm and normal heart sounds.     Pulmonary/Chest: Effort normal and breath sounds normal. No respiratory distress. She has no wheezes. She has no rales.  Neurological: She is alert and oriented to person, place, and time.  Psychiatric: Judgment normal. Her mood appears anxious.       Anxious appearance but much less so than on prior exams          Assessment & Plan:  1. Cough: likely secondary to Upper Airway Cough Syndrome secondary to Vasomotor Rhinitis, pt with copious amounts of post nasal drip on exam and hypertrophied turbinates.  Would benefit from short course of 1st generation antihistamine and nasal steroid vs nasal ipratropium.  Given that she is on ACEi could be secondary to ACEi cough although her compliance is unclear.   -chlorpheniramine 4 mg q6h prn -Flonase --> pt states cant afford $17 at [facility] -if sx do not improve trial off of ACEi.  2. Htn: above goal today: questionable compliance with regimen of atenolol 50 mg qd and lisinopril-hct 20-12.5 mg qd -emphasized need for compliance-->f/u in 2 weeks for bp recheck  3. Possible std exposure: condom broke -hiv -Urine GC/Chlam - pt to call Friday for results  4. Anxiety: stable

## 2012-08-17 DIAGNOSIS — J300 Vasomotor rhinitis: Secondary | ICD-10-CM | POA: Insufficient documentation

## 2012-08-17 LAB — GC/CHLAMYDIA PROBE AMP, URINE: GC Probe Amp, Urine: NEGATIVE

## 2012-08-17 NOTE — Assessment & Plan Note (Signed)
Trial of chlorpheniramine.  Would benefit from nasal steroid or ipratropium but cost prohibitive. -if no improvement, further investigate compliance with lisinopril--> if compliant consider switch to ARB

## 2012-08-17 NOTE — Assessment & Plan Note (Signed)
Trial of chlorpheniramine.  Would benefit from nasal steroid or ipratropium but cost prohibitive.

## 2012-08-23 ENCOUNTER — Encounter: Payer: Self-pay | Admitting: Internal Medicine

## 2012-08-23 ENCOUNTER — Ambulatory Visit (INDEPENDENT_AMBULATORY_CARE_PROVIDER_SITE_OTHER): Payer: No Typology Code available for payment source | Admitting: Internal Medicine

## 2012-08-23 VITALS — BP 139/98 | HR 94 | Temp 98.8°F | Ht 64.0 in | Wt 238.1 lb

## 2012-08-23 DIAGNOSIS — F329 Major depressive disorder, single episode, unspecified: Secondary | ICD-10-CM

## 2012-08-23 DIAGNOSIS — G4700 Insomnia, unspecified: Secondary | ICD-10-CM

## 2012-08-23 DIAGNOSIS — K0500 Acute gingivitis, plaque induced: Secondary | ICD-10-CM

## 2012-08-23 DIAGNOSIS — I10 Essential (primary) hypertension: Secondary | ICD-10-CM

## 2012-08-23 DIAGNOSIS — F411 Generalized anxiety disorder: Secondary | ICD-10-CM

## 2012-08-23 DIAGNOSIS — F419 Anxiety disorder, unspecified: Secondary | ICD-10-CM

## 2012-08-23 DIAGNOSIS — K089 Disorder of teeth and supporting structures, unspecified: Secondary | ICD-10-CM

## 2012-08-23 DIAGNOSIS — F32A Depression, unspecified: Secondary | ICD-10-CM

## 2012-08-23 MED ORDER — CLINDAMYCIN HCL 300 MG PO CAPS: 300.0000 mg | ORAL_CAPSULE | Freq: Three times a day (TID) | ORAL | Status: DC

## 2012-08-23 MED ORDER — FLUOXETINE HCL 20 MG PO CAPS: 20.0000 mg | ORAL_CAPSULE | Freq: Every day | ORAL | Status: DC

## 2012-08-23 MED ORDER — ZOLPIDEM TARTRATE 10 MG PO TABS: 10.0000 mg | ORAL_TABLET | Freq: Every evening | ORAL | Status: AC | PRN

## 2012-08-23 NOTE — Assessment & Plan Note (Signed)
Patient has a symptoms of anxiety, including being anxious all the time, irritation, poor sleep. It is difficult to differentiate her anxiety from her ongoing he psych issues including depression and bipolar disorder. Patient is supposed to see [facility] behavior health, but has never been there yet. Currently patient does not have any suicidal homicidal ideation. I discussed this case with Dr. Aundria Rud in the past. Per Dr. Aundria Rud, we cannot continue patient's Xanax until patient is seen by [facility] behavior health clinic.  -I convinced patient to follow up with [facility] behavior health clinic and she agreed to do so. We provided all related inflammations to the patient. -will treat her insomnia with Ambien shortly. -will continue her Prozac for depression.

## 2012-08-23 NOTE — Patient Instructions (Signed)
1. Please go to [facility] clinic with one week 2. Please take all medications as prescribed.  3. If you have worsening of your symptoms or new symptoms arise, please call the clinic ([phone]), or go to the ER immediately if symptoms are severe.    Insomnia Insomnia is frequent trouble falling and/or staying asleep. Insomnia can be a long term problem or a short term problem. Both are common. Insomnia can be a short term problem when the wakefulness is related to a certain stress or worry. Long term insomnia is often related to ongoing stress during waking hours and/or poor sleeping habits. Overtime, sleep deprivation itself can make the problem worse. Every little thing feels more severe because you are overtired and your ability to cope is decreased. CAUSES   Stress, anxiety, and depression.  Poor sleeping habits.  Distractions such as TV in the bedroom.  Naps close to bedtime.  Engaging in emotionally charged conversations before bed.  Technical reading before sleep.  Alcohol and other sedatives. They may make the problem worse. They can hurt normal sleep patterns and normal dream activity.  Stimulants such as caffeine for several hours prior to bedtime.  Pain syndromes and shortness of breath can cause insomnia.  Exercise late at night.  Changing time zones may cause sleeping problems (jet lag). It is sometimes helpful to have someone observe your sleeping patterns. They should look for periods of not breathing during the night (sleep apnea). They should also look to see how long those periods last. If you live alone or observers are uncertain, you can also be observed at a sleep clinic where your sleep patterns will be professionally monitored. Sleep apnea requires a checkup and treatment. Give your caregivers your medical history. Give your caregivers observations your family has made about your sleep.  SYMPTOMS   Not feeling rested in the morning.  Anxiety and restlessness at  bedtime.  Difficulty falling and staying asleep. TREATMENT   Your caregiver may prescribe treatment for an underlying medical disorders. Your caregiver can give advice or help if you are using alcohol or other drugs for self-medication. Treatment of underlying problems will usually eliminate insomnia problems.  Medications can be prescribed for short time use. They are generally not recommended for lengthy use.  Over-the-counter sleep medicines are not recommended for lengthy use. They can be habit forming.  You can promote easier sleeping by making lifestyle changes such as:  Using relaxation techniques that help with breathing and reduce muscle tension.  Exercising earlier in the day.  Changing your diet and the time of your last meal. No night time snacks.  Establish a regular time to go to bed.  Counseling can help with stressful problems and worry.  Soothing music and white noise may be helpful if there are background noises you cannot remove.  Stop tedious detailed work at least one hour before bedtime. HOME CARE INSTRUCTIONS   Keep a diary. Inform your caregiver about your progress. This includes any medication side effects. See your caregiver regularly. Take note of:  Times when you are asleep.  Times when you are awake during the night.  The quality of your sleep.  How you feel the next day. This information will help your caregiver care for you.  Get out of bed if you are still awake after 15 minutes. Read or do some quiet activity. Keep the lights down. Wait until you feel sleepy and go back to bed.  Keep regular sleeping and waking hours. Avoid naps.  Exercise regularly.  Avoid distractions at bedtime. Distractions include watching television or engaging in any intense or detailed activity like attempting to balance the household checkbook.  Develop a bedtime ritual. Keep a familiar routine of bathing, brushing your teeth, climbing into bed at the same time  each night, listening to soothing music. Routines increase the success of falling to sleep faster.  Use relaxation techniques. This can be using breathing and muscle tension release routines. It can also include visualizing peaceful scenes. You can also help control troubling or intruding thoughts by keeping your mind occupied with boring or repetitive thoughts like the old concept of counting sheep. You can make it more creative like imagining planting one beautiful flower after another in your backyard garden.  During your day, work to eliminate stress. When this is not possible use some of the previous suggestions to help reduce the anxiety that accompanies stressful situations. MAKE SURE YOU:   Understand these instructions.  Will watch your condition.  Will get help right away if you are not doing well or get worse. Document Released: 07/25/2000 Document Revised: 10/20/2011 Document Reviewed: 08/25/2007 [facility] Patient Information 2013 [place], [place].

## 2012-08-23 NOTE — Assessment & Plan Note (Signed)
The patient has a poor dentition with caries. One of these caries has gum infection which is consistent with gingivitis. Will treat with clindamycin for 7 days.

## 2012-08-23 NOTE — Progress Notes (Signed)
[name], female   DOB: April 25, 1961, 52 y.o.   MRN: [id number] Subjective:   [name] female   DOB: 05/01/1961 52 y.o.   MRN: [id number]  CC:   Acute visit for insomnia, anxiety and dental pain. HPI:  Jennifer [name] is a 52 y.o. lady with past medical history as outlined below, who presents for an acute visit.  1) Anxiety: patient has this issue for long time. Patient reports that her anxiety still persists. She has very poor sleep, both in falling asleep and in maintaining asleep. She is anxious all the times for no reason. Patient was advised to go to [facility] behavior health clinic for several times in the past, but she didn't go. She keeps asking for Xanax prescription this time just like she did before when I saw her in the clinic. She said Xanax was the only effective medication for her anxiety in the past. She tried trazodone without any help. Patient was supposed to be on Seroquel, but she's not taking Seroquel. Instead she is taking Prozac 20 mg daily for her depression. She feels like her depression is well controlled. She does not have suicidal or homicidal ideations today.  2) Dental pain: Patient has very poor dentition with several dental caries. She reports that she has severe dental pain which is 10 out of 10 in severity, aching like and nonradiating. She does not have fever or chills. Her dental pain has been going on for nearly a month.   Past Medical History  Diagnosis Date  . Fibroids     now s/p hysterectomy  . Left ventricular hypertrophy   . Polysubstance abuse     IV drug [place], cocaine on occassion, smoking and alcoholism, has been going to AA, not completely sober  . Hypertension   . bipolar   . Depression    Current Outpatient Prescriptions  Medication Sig Dispense Refill  . atenolol (TENORMIN) 50 MG tablet Take 1 tablet (50 mg total) by mouth daily.  30 tablet  3  . chlorpheniramine (CHLOR-TRIMETON) 4 MG tablet Take 1  tablet (4 mg total) by mouth every 6 (six) hours as needed for allergies.  20 tablet  0  . lisinopril-hydrochlorothiazide (ZESTORETIC) 20-12.5 MG per tablet Take 1 tablet by mouth daily.  90 tablet  4  . clindamycin (CLEOCIN) 300 MG capsule Take 1 capsule (300 mg total) by mouth 3 (three) times daily.  21 capsule  0  . DiphenhydrAMINE HCl, Sleep, 50 MG tablet Take 1 tablet (50 mg total) by mouth at bedtime as needed for sleep.  30 tablet  1  . estrogens, conjugated, (PREMARIN) 0.3 MG tablet Take 2 tablets (0.6 mg total) by mouth daily. Take daily for 21 days then do not take for 7 days.  60 tablet  3  . FLUoxetine (PROZAC) 20 MG capsule Take 1 capsule (20 mg total) by mouth daily.  30 capsule  3  . fluticasone (FLONASE) 50 MCG/ACT nasal spray Place 2 sprays into the nose daily.  16 g  2  . ibuprofen (ADVIL,MOTRIN) 800 MG tablet Take 1 tablet (800 mg total) by mouth every 6 (six) hours as needed for pain.  60 tablet  1  . zolpidem (AMBIEN) 10 MG tablet Take 1 tablet (10 mg total) by mouth at bedtime as needed for sleep.  20 tablet  0   Family History  Problem Relation Age of Onset  . Hypertension Mother   . Hypertension Sister   .  Diabetes Father    History   Social History  . Marital Status: Single    Spouse Name: N/A    Number of Children: N/A  . Years of Education: N/A   Social History Main Topics  . Smoking status: Current Every Day Smoker -- 0.5 packs/day for 16 years    Types: Cigarettes  . Smokeless tobacco: Never Used     Comment: Wants to quit.  Cutting back - smokes mainly at night.  . Alcohol Use: Yes     Comment: Occasional drinker  . Drug Use: No  . Sexually Active: None   Other Topics Concern  . None   Social History Narrative   Financial assistance approved for 100% discount at [facility] and has [facility] card per [place]12/04/2010    Review of Systems: General: no fevers, chills, no changes in body weight, no changes in appetite. Has anxiety and poor sleep. Skin:  no rash HEENT: no blurry vision, hearing changes or sore throat. Has dental pain. Pulm: no dyspnea, coughing, wheezing CV: no chest pain, palpitations, shortness of breath Abd: no nausea/vomiting, abdominal pain, diarrhea/constipation GU: no dysuria, hematuria, polyuria Ext: no arthralgias, myalgias Neuro: no weakness, numbness, or tingling  Objective:  Physical Exam: Filed Vitals:   08/23/12 1510  BP: 139/98  Pulse: 94  Temp: 98.8 F (37.1 C)  TempSrc: Oral  Height: 5\' 4"  (1.626 m)  Weight: 238 lb 1.6 oz (108.001 kg)  SpO2: 97%   General: Not in acute distress HEENT: PERRL, EOMI, no scleral icterus, No bruit or JVD. Dental: there are three teeth with caries, including lower right second molar, upper left first and third molars. There is signs of gum infection around upper left first molar. There is no abscess.  Cardiac: S1/S2, RRR, No murmurs, gallops or rubs Pulm: Good air movement bilaterally, Clear to auscultation bilaterally, No rales, wheezing, rhonchi or rubs. Abd: Soft,  nondistended, nontender, no rebound pain, no organomegaly, BS present Ext: No rashes or edema, 2+DP/PT pulse bilaterally Musculoskeletal: No joint deformities, erythema, or stiffness, ROM full and nontender Skin: no rashes. No skin bruise. Neuro: Alert and oriented X3, cranial nerves II-XII grossly intact, muscle strength 5/5 in all extremeties,  sensation to light touch intact. Brachial reflexes 1+ bilaterally. Knee reflexes 2+ bilaterally. Babinski's sign negative. Psych: patient is not psychotic, no suicidal or hemocidal ideation.    Assessment & Plan:

## 2012-08-23 NOTE — Assessment & Plan Note (Signed)
Patient's blood pressure is slightly elevated. Today her blood pressure is 139/98 mmHg. It is likely due to dental pain and her anxiety. Will not change her current medications. We'll followup.

## 2012-09-28 ENCOUNTER — Other Ambulatory Visit: Payer: Self-pay | Admitting: Internal Medicine

## 2012-09-28 ENCOUNTER — Other Ambulatory Visit: Payer: Self-pay | Admitting: *Deleted

## 2012-09-28 MED ORDER — HYDROCHLOROTHIAZIDE 12.5 MG PO CAPS: 12.5000 mg | ORAL_CAPSULE | Freq: Every day | ORAL | Status: DC

## 2012-09-28 MED ORDER — LISINOPRIL 20 MG PO TABS: 20.0000 mg | ORAL_TABLET | Freq: Every day | ORAL | Status: DC

## 2012-09-28 MED ORDER — LISINOPRIL-HYDROCHLOROTHIAZIDE 20-12.5 MG PO TABS: 1.0000 | ORAL_TABLET | Freq: Every day | ORAL | Status: DC

## 2012-09-28 MED ORDER — ATENOLOL 50 MG PO TABS: 50.0000 mg | ORAL_TABLET | Freq: Every day | ORAL | Status: DC

## 2012-09-28 NOTE — Telephone Encounter (Signed)
Pt gets meds at [facility] so needs hctz and lisinopril separate pills

## 2012-09-28 NOTE — Telephone Encounter (Signed)
Rx called in.  Lisinopril and hctz will be separate per Dr Anselm Jungling New Rx written

## 2012-09-29 ENCOUNTER — Encounter: Payer: Self-pay | Admitting: Licensed Clinical Social Worker

## 2012-09-29 ENCOUNTER — Encounter: Payer: Self-pay | Admitting: Internal Medicine

## 2012-09-29 ENCOUNTER — Ambulatory Visit (INDEPENDENT_AMBULATORY_CARE_PROVIDER_SITE_OTHER): Payer: No Typology Code available for payment source | Admitting: Internal Medicine

## 2012-09-29 VITALS — BP 142/93 | HR 87 | Temp 98.0°F | Ht 63.5 in | Wt 239.5 lb

## 2012-09-29 DIAGNOSIS — K029 Dental caries, unspecified: Secondary | ICD-10-CM | POA: Insufficient documentation

## 2012-09-29 MED ORDER — ESZOPICLONE 2 MG PO TABS: 2.0000 mg | ORAL_TABLET | Freq: Every day | ORAL | Status: DC

## 2012-09-29 MED ORDER — IBUPROFEN 800 MG PO TABS
800.0000 mg | ORAL_TABLET | Freq: Four times a day (QID) | ORAL | Status: DC | PRN
Start: 2012-09-29 — End: 2012-12-06

## 2012-09-29 MED ORDER — OXYCODONE-ACETAMINOPHEN 5-325 MG PO TABS: 1.0000 | ORAL_TABLET | ORAL | Status: DC | PRN

## 2012-09-29 MED ORDER — AMOXICILLIN-POT CLAVULANATE 875-125 MG PO TABS: 1.0000 | ORAL_TABLET | Freq: Two times a day (BID) | ORAL | Status: DC

## 2012-09-29 NOTE — Assessment & Plan Note (Addendum)
Dental caries on left upper molar.  Patient insisted on antibiotics until she get to see a dentist. She has completed a course of clindamycin in January. She continues to have pain and feels that her face is swollen.I do not think that she has acute gingivitis however she might have Periodontitis which would need systemic antimicrobials in conjunction with mechanical debridement.   -Prescribe Augmentin 875 mg by mouth twice a day x10 days -I gave her a new prescription of ibuprofen because she says she already gave her prescription to [facility]. -She has a dental clinic appointment in April

## 2012-09-29 NOTE — Progress Notes (Signed)
Patient ID: LENIYA BREIT, female   DOB: 08-27-1960, 52 y.o.   MRN: [id number] History of present illness: Ms. Sol Passer is a 52 year old woman with past medical history of bipolar presents today for tooth pain, one month in duration. She was given clindamycin which helped for a little bit but then now the pain has returned. She has not seen by a dentist. She denies any fever chills nausea or vomiting. She wants to get prescription for Lunesta [facility]. She is trying to see if mental health will feel Lunesta for her. She tells me that she was not able to get the ibuprofen because it cost $15 instead of $5 for the Percocet.  Review of system: As per history of present illness  Physical examination: General: alert, well-developed, and cooperative to examination. Mouth: left upper molar: dental caries. Gums did not show any erythema. Tenderness to palpation with tongue depressor.  No abscess noted.  Lungs: normal respiratory effort, no accessory muscle use, normal breath sounds, no crackles, and no wheezes. Heart: normal rate, regular rhythm, no murmur, no gallop, and no rub.  Abdomen: soft, non-tender, normal bowel sounds, no distention, no guarding, no rebound tenderness Neurologic: nonfocal Skin: turgor normal and no rashes.

## 2012-09-29 NOTE — Patient Instructions (Addendum)
We will refer you to dental clinic. Start taking Augmentin 875 mg one tablet twice daily x10 days Followup as needed

## 2012-09-30 NOTE — Progress Notes (Signed)
Pt requesting to speak with CSW during her scheduled [facility] appt.  Pt states she is in need of bus passes to return home from appt and to obtain medication from [facility].  Pt also inquiring if CSW has available [employer] gift cards and if CSW can assist with procurement of Lunesta. Pt's main concern following her appt is procuring her Alfonso Patten, pt states she has failed with other treatment options: Trazadone and Ambien.  Ms. Roepke states Sominex does provide some sleep for her but she has to take more than the suggested amount.  Pt has a current prescription for DiphenhydrAMINE HCl 50 MG tablet, but complains this offers no relief.  Ms. Tiede is current with her [facility] Card and is able to obtain medications on the [facility] formulary for free, pt is also aware of MAP.  Alfonso Patten is not on formulary and MAP does not participate with manufacturer program.  Alfonso Patten is not on [facility] Formulary.  Cost is over $300 for prescription.  CSW provided Ms. Blank with coupon from [employer] to obtain 3 pills for $32.  Pt grateful.  CSW informed Ms. Gotto, CSW will inquire with [facility] pharmacist if [facility] participates in Prescription program for [place].  CSW provided Ms. Prisk with 3 one way bus passes and signed pt up for the [facility] medical transportation.  Pt provided with brochure and informed to call [facility] one week prior to any [facility] medical appt for transportation.  CSW informed Ms. Pfannenstiel, CSW no longer has any gift cards or cafeteria vouchers available but pt received bag of groceries from [facility] pantry.

## 2012-10-26 ENCOUNTER — Emergency Department (HOSPITAL_COMMUNITY)
Admission: EM | Admit: 2012-10-26 | Discharge: 2012-10-26 | Discharge status: Against Medical Advice | Payer: Self-pay | Attending: Emergency Medicine | Admitting: Emergency Medicine

## 2012-10-26 DIAGNOSIS — F17200 Nicotine dependence, unspecified, uncomplicated: Secondary | ICD-10-CM | POA: Insufficient documentation

## 2012-10-26 DIAGNOSIS — R51 Headache: Secondary | ICD-10-CM | POA: Insufficient documentation

## 2012-10-26 NOTE — ED Notes (Signed)
After pts BP was checked pt states "OK I have to go, because I need to f/u with my regular people." Explained to pt that she should be seen by ER MD and pt refused. Pt left triage room and walked out of ER.

## 2012-10-26 NOTE — ED Notes (Signed)
Pt c/o HA states "I've been having this headache and I think its related to my BP, I just want to have my BP checked."

## 2012-11-03 ENCOUNTER — Other Ambulatory Visit: Payer: Self-pay | Admitting: *Deleted

## 2012-11-03 DIAGNOSIS — F329 Major depressive disorder, single episode, unspecified: Secondary | ICD-10-CM

## 2012-11-04 MED ORDER — FLUOXETINE HCL 20 MG PO CAPS: 20.0000 mg | ORAL_CAPSULE | Freq: Every day | ORAL | Status: DC

## 2012-11-05 NOTE — Telephone Encounter (Signed)
Called to pharm 

## 2012-11-24 ENCOUNTER — Telehealth: Payer: Self-pay | Admitting: Internal Medicine

## 2012-11-24 NOTE — Telephone Encounter (Signed)
Rec'd faxed confirmation from [facility] in regards to this patient's referral.  This patient has been contacted by [facility].  They have tried calling and the phone is not in service.  Carollee Herter from the Dental clinic has been unsuccessful at making this appointment.  Patient needs to update her information with the Clinic.

## 2012-11-25 ENCOUNTER — Other Ambulatory Visit: Payer: Self-pay

## 2012-11-25 ENCOUNTER — Emergency Department (HOSPITAL_COMMUNITY)
Admission: EM | Admit: 2012-11-25 | Discharge: 2012-11-25 | Disposition: A | Discharge status: Home / Self Care | Payer: Self-pay | Attending: Emergency Medicine | Admitting: Emergency Medicine

## 2012-11-25 ENCOUNTER — Encounter (HOSPITAL_COMMUNITY): Payer: Self-pay | Admitting: *Deleted

## 2012-11-25 ENCOUNTER — Encounter: Payer: Self-pay | Admitting: Licensed Clinical Social Worker

## 2012-11-25 ENCOUNTER — Telehealth: Payer: Self-pay | Admitting: *Deleted

## 2012-11-25 DIAGNOSIS — F419 Anxiety disorder, unspecified: Secondary | ICD-10-CM

## 2012-11-25 DIAGNOSIS — Z8679 Personal history of other diseases of the circulatory system: Secondary | ICD-10-CM | POA: Insufficient documentation

## 2012-11-25 DIAGNOSIS — Z8742 Personal history of other diseases of the female genital tract: Secondary | ICD-10-CM | POA: Insufficient documentation

## 2012-11-25 DIAGNOSIS — I10 Essential (primary) hypertension: Secondary | ICD-10-CM | POA: Insufficient documentation

## 2012-11-25 DIAGNOSIS — F411 Generalized anxiety disorder: Secondary | ICD-10-CM | POA: Insufficient documentation

## 2012-11-25 DIAGNOSIS — R42 Dizziness and giddiness: Secondary | ICD-10-CM | POA: Insufficient documentation

## 2012-11-25 DIAGNOSIS — F319 Bipolar disorder, unspecified: Secondary | ICD-10-CM | POA: Insufficient documentation

## 2012-11-25 DIAGNOSIS — Z79899 Other long term (current) drug therapy: Secondary | ICD-10-CM | POA: Insufficient documentation

## 2012-11-25 DIAGNOSIS — F17200 Nicotine dependence, unspecified, uncomplicated: Secondary | ICD-10-CM | POA: Insufficient documentation

## 2012-11-25 DIAGNOSIS — R51 Headache: Secondary | ICD-10-CM | POA: Insufficient documentation

## 2012-11-25 DIAGNOSIS — R002 Palpitations: Secondary | ICD-10-CM | POA: Insufficient documentation

## 2012-11-25 MED ORDER — ALPRAZOLAM 0.5 MG PO TABS: 0.5000 mg | ORAL_TABLET | Freq: Every evening | ORAL | Status: DC | PRN

## 2012-11-25 MED ORDER — ALPRAZOLAM 0.25 MG PO TABS
0.5000 mg | ORAL_TABLET | Freq: Once | ORAL | Status: AC
  Administered 2012-11-25: 0.5 mg via ORAL
  Filled 2012-11-25: qty 2

## 2012-11-25 NOTE — ED Provider Notes (Signed)
History     CSN: [id number]  Arrival date & time 11/25/12  1215   First MD Initiated Contact with Patient 11/25/12 1225      Chief Complaint  Patient presents with  . Hypertension  . Headache    (Consider location/radiation/quality/duration/timing/severity/associated sxs/prior treatment) HPI Comments: Patient is a 52 year old woman who presents today with complaints of hypertension and headaches. She measures her blood pressure every day. She has blood pressure medication which she has been taking and has doubled recently in an attempt to reduce her blood pressure. She states many times she can feel her heart was fluttering. This is not associated with shortness of breath or diaphoresis. No chest pain. She has a history of anxiety for which she takes Xanax. She would like [place] to look at her heart today. She repeats "I don't want to go home and die". She states her headaches are frontal headaches that resolve within afternoon Tylenol. No aura, photophobia, fever, chills, nausea, vomiting, abdominal pain, numbness, weakness.  The history is provided by the patient. No language interpreter was used.    Past Medical History  Diagnosis Date  . Fibroids     now s/p hysterectomy  . Left ventricular hypertrophy   . Polysubstance abuse     IV drug [place], cocaine on occassion, smoking and alcoholism, has been going to AA, not completely sober  . Hypertension   . bipolar   . Depression     Past Surgical History  Procedure Laterality Date  . Abdominal hysterectomy      2003  . Exploratory laparotomy      complex pelvic mass 2011  . Bilateral salpingoophorectomy      01/2010    Family History  Problem Relation Age of Onset  . Hypertension Mother   . Hypertension Sister   . Diabetes Father     History  Substance Use Topics  . Smoking status: Current Every Day Smoker -- 0.50 packs/day for 16 years    Types: Cigarettes  . Smokeless tobacco: Never Used     Comment: Wants to quit.   Cutting back - smokes mainly at night.  . Alcohol Use: Yes     Comment: Occasional drinker    OB History   Grav Para Term Preterm Abortions TAB SAB Ect Mult Living   5 0 0 0 4 0 1 0 0 0       Review of Systems  Constitutional: Negative for fever and chills.  Eyes: Negative for photophobia.  Respiratory: Negative for chest tightness and shortness of breath.   Cardiovascular: Positive for palpitations. Negative for chest pain.  Gastrointestinal: Negative for nausea, vomiting, abdominal pain and diarrhea.  Neurological: Positive for light-headedness and headaches. Negative for weakness.    Allergies  Review of patient's allergies indicates no known allergies.  Home Medications   Current Outpatient Rx  Name  Route  Sig  Dispense  Refill  . atenolol (TENORMIN) 50 MG tablet   Oral   Take 1 tablet (50 mg total) by mouth daily.   30 tablet   3   . DiphenhydrAMINE HCl, Sleep, 50 MG tablet   Oral   Take 1 tablet (50 mg total) by mouth at bedtime as needed for sleep.   30 tablet   1   . Diphenhydramine-APAP, sleep, (TYLENOL PM EXTRA STRENGTH PO)   Oral   Take 1 tablet by mouth at bedtime as needed. For sleep         . FLUoxetine (PROZAC) 20 MG  capsule   Oral   Take 1 capsule (20 mg total) by mouth daily.   90 capsule   1   . hydrochlorothiazide (HYDRODIURIL) 12.5 MG tablet   Oral   Take 12.5 mg by mouth daily.         [name] ibuprofen (ADVIL,MOTRIN) 800 MG tablet   Oral   Take 1 tablet (800 mg total) by mouth every 6 (six) hours as needed for pain.   60 tablet   1   . lisinopril (PRINIVIL,ZESTRIL) 20 MG tablet   Oral   Take 1 tablet (20 mg total) by mouth daily.   90 tablet   2     BP 152/102  Pulse 59  Temp(Src) 98.4 F (36.9 C) (Oral)  SpO2 100%  LMP 12/04/2009  Physical Exam  Nursing note and vitals reviewed. Constitutional: She is oriented to person, place, and time. She appears well-developed and well-nourished. No distress.  HENT:  Head:  Normocephalic and atraumatic.  Right Ear: External ear normal.  Left Ear: External ear normal.  Nose: Nose normal.  Mouth/Throat: Oropharynx is clear and moist.  Eyes: Conjunctivae and EOM are normal. Pupils are equal, round, and reactive to light.  Neck: Normal range of motion.  Cardiovascular: Normal rate, regular rhythm and normal heart sounds.   Pulmonary/Chest: Effort normal and breath sounds normal. No stridor. No respiratory distress. She has no wheezes. She has no rales.  Abdominal: Soft. She exhibits no distension.  Musculoskeletal: Normal range of motion.  Neurological: She is alert and oriented to person, place, and time. She has normal strength. No cranial nerve deficit (III-XII). She exhibits normal muscle tone. Coordination normal.  Skin: Skin is warm and dry. She is not diaphoretic. No erythema.  Psychiatric: Her mood appears anxious. Her speech is rapid and/or pressured. She is agitated. She expresses impulsivity.    ED Course  Procedures (including critical care time)  Labs Reviewed - No data to display No results found.  Prior to being seen by me the patient left her room and asked me to put her in contact with human resources as well as the director of the emergency department to complain about her care. She was unhappy with the nurse was asking her questions about her visit today. She felt as though the nurse was being rude. The charge nurse was called and the patient was able to have a new nurse assigned to her case. The charge nurse appeared to be successful with calming the patient down. The patient and apologized for being rude to me.   Date: 11/25/2012  Rate: 56  Rhythm: normal sinus rhythm and premature ventricular contractions (PVC)  QRS Axis: normal  Intervals: normal  ST/T Wave abnormalities: nonspecific T wave changes  Conduction Disutrbances:none  Narrative Interpretation:   Old EKG Reviewed: unchanged   1. Anxiety   2. Hypertension       MDM   Patient presents today for hypertension and headaches. Blood pressure reading was 152/102. Discussed that this pressure was likely not high enough to be causing her headaches. EKG changes or consistent with left ventricular hypertrophy due to long-standing hypertension. Follow up with PCP to obtain better control of your hypertension. Given Xanax prescription for anxiety. She was also evaluated by Dr. Juleen China who agrees with plan. Return precautions given. Vital signs stable for discharge. Patient / Family / Caregiver informed of clinical course, understand medical decision-making process, and agree with plan.         Mora Bellman, PA-C  11/25/12 1625 

## 2012-11-25 NOTE — ED Notes (Signed)
To ED to 'see if my pressure is high'. Pt states she has had a HA for the past cple weeks. Pt states she has been adjusting her bp meds depending on diastolic bp. States she took her meds this am (3 hrs ago). No vomiting, only nausea. Pt sees [facility] for primary care.

## 2012-11-25 NOTE — ED Notes (Signed)
Pt very anxious about her BP. States she checks it several times a day and the diastolic is up to 117. States her "heart races" at times and makes her feel SOB. Pt worried that she may die at home. Lives alone. No family support.

## 2012-11-25 NOTE — Telephone Encounter (Signed)
Pt here at the clinic - States her BP's have been in the 6-digit range despite taking her medications. BP checked - 143/98 ; P- 56. Instructed pt if she continues to have increased BP, h/a's, dizziness - to go to the ED or Urgent Care. She agreed. She also stated  She will scheduled an appt for next week.

## 2012-11-25 NOTE — Progress Notes (Signed)
Jennifer [name] presents as a walk-in to meet with CSW.  Pt states she came to [facility] to try to get Rx for her blood pressure, no appt's available today. CSW inquired if pt was going to go to ED/Urgent Care.  Pt states "they're not gonna do anything, I don't have time for the emergency room".  Pt voiced frustration with having to be asked the nature of her visit at the front desk.  CSW provided Jennifer [name] with CSW contact information for pt to schedule time to meet with CSW, without having to ask a third party, the nature of the meeting.  Pt requesting bus fare for weekend, appt and "vouchers".  Pt aware this CSW does not have vouchers available but can assist with items from [facility] pantry.  In addition, pt reminded of transportation service available through [facility] for free medical transportation to/from [place].  CSW provided Jennifer [name] with the phone number to schedule next weeks appt.  Jennifer [name] provided with toiletries from pantry and small bag of food.

## 2012-11-30 NOTE — ED Provider Notes (Signed)
Medical screening examination/treatment/procedure(s) were conducted as a shared visit with non-physician practitioner(s) and myself.  I personally evaluated the patient during the encounter.  Pt with multiple complaints. Suspect strong anxiety component. Hypertensive but hx of same. EKG with repolarization abnormalities likely from strain. Similar to previous. Pt denies CP. She is not encephalopathic and has a nonfocal neuro exam. Doubt hypertensive emergency. I feel she is safe for DC.   Raeford Razor, MD 11/30/12 [phone]

## 2012-12-02 ENCOUNTER — Ambulatory Visit: Payer: Self-pay

## 2012-12-02 ENCOUNTER — Ambulatory Visit: Payer: Self-pay | Admitting: Radiation Oncology

## 2012-12-06 ENCOUNTER — Emergency Department (HOSPITAL_COMMUNITY)
Admission: EM | Admit: 2012-12-06 | Discharge: 2012-12-06 | Disposition: A | Discharge status: Home / Self Care | Payer: Self-pay | Attending: Emergency Medicine | Admitting: Emergency Medicine

## 2012-12-06 ENCOUNTER — Encounter (HOSPITAL_COMMUNITY): Payer: Self-pay | Admitting: Emergency Medicine

## 2012-12-06 ENCOUNTER — Emergency Department (HOSPITAL_COMMUNITY): Payer: Self-pay

## 2012-12-06 DIAGNOSIS — F17200 Nicotine dependence, unspecified, uncomplicated: Secondary | ICD-10-CM | POA: Insufficient documentation

## 2012-12-06 DIAGNOSIS — Z8742 Personal history of other diseases of the female genital tract: Secondary | ICD-10-CM | POA: Insufficient documentation

## 2012-12-06 DIAGNOSIS — J3489 Other specified disorders of nose and nasal sinuses: Secondary | ICD-10-CM | POA: Insufficient documentation

## 2012-12-06 DIAGNOSIS — Z8679 Personal history of other diseases of the circulatory system: Secondary | ICD-10-CM | POA: Insufficient documentation

## 2012-12-06 DIAGNOSIS — R0981 Nasal congestion: Secondary | ICD-10-CM

## 2012-12-06 DIAGNOSIS — Z79899 Other long term (current) drug therapy: Secondary | ICD-10-CM | POA: Insufficient documentation

## 2012-12-06 DIAGNOSIS — R0602 Shortness of breath: Secondary | ICD-10-CM | POA: Insufficient documentation

## 2012-12-06 DIAGNOSIS — F319 Bipolar disorder, unspecified: Secondary | ICD-10-CM | POA: Insufficient documentation

## 2012-12-06 DIAGNOSIS — R079 Chest pain, unspecified: Secondary | ICD-10-CM | POA: Insufficient documentation

## 2012-12-06 DIAGNOSIS — R05 Cough: Secondary | ICD-10-CM | POA: Insufficient documentation

## 2012-12-06 DIAGNOSIS — R059 Cough, unspecified: Secondary | ICD-10-CM | POA: Insufficient documentation

## 2012-12-06 DIAGNOSIS — R51 Headache: Secondary | ICD-10-CM | POA: Insufficient documentation

## 2012-12-06 DIAGNOSIS — I10 Essential (primary) hypertension: Secondary | ICD-10-CM | POA: Insufficient documentation

## 2012-12-06 DIAGNOSIS — F419 Anxiety disorder, unspecified: Secondary | ICD-10-CM

## 2012-12-06 DIAGNOSIS — F411 Generalized anxiety disorder: Secondary | ICD-10-CM | POA: Insufficient documentation

## 2012-12-06 LAB — BASIC METABOLIC PANEL
BUN: 16 mg/dL (ref 6–23)
CO2: 25 mEq/L (ref 19–32)
Calcium: 9.9 mg/dL (ref 8.4–10.5)
Chloride: 105 mEq/L (ref 96–112)
Creatinine, Ser: 0.93 mg/dL (ref 0.50–1.10)

## 2012-12-06 LAB — CBC
HCT: 42.2 % (ref 36.0–46.0)
MCH: 28.9 pg (ref 26.0–34.0)
MCV: 84.6 fL (ref 78.0–100.0)
Platelets: 240 10*3/uL (ref 150–400)
RDW: 14.2 % (ref 11.5–15.5)

## 2012-12-06 LAB — POCT I-STAT TROPONIN I: Troponin i, poc: 0 ng/mL (ref 0.00–0.08)

## 2012-12-06 LAB — D-DIMER, QUANTITATIVE: D-Dimer, Quant: <0.27 ug/mL-FEU (ref 0.00–0.48)

## 2012-12-06 LAB — PRO B NATRIURETIC PEPTIDE: Pro B Natriuretic peptide (BNP): 119.8 pg/mL (ref 0–125)

## 2012-12-06 MED ORDER — AMOXICILLIN 500 MG PO CAPS: 500.0000 mg | ORAL_CAPSULE | Freq: Three times a day (TID) | ORAL | Status: DC

## 2012-12-06 MED ORDER — ALPRAZOLAM 0.25 MG PO TABS
0.5000 mg | ORAL_TABLET | Freq: Once | ORAL | Status: AC
  Administered 2012-12-06: 0.5 mg via ORAL
  Filled 2012-12-06: qty 2

## 2012-12-06 MED ORDER — OXYCODONE-ACETAMINOPHEN 5-325 MG PO TABS
1.0000 | ORAL_TABLET | Freq: Once | ORAL | Status: AC
  Administered 2012-12-06: 1 via ORAL
  Filled 2012-12-06: qty 1

## 2012-12-06 MED ORDER — FLUTICASONE PROPIONATE 50 MCG/ACT NA SUSP: 1.0000 | Freq: Every day | NASAL | Status: DC

## 2012-12-06 MED ORDER — ALPRAZOLAM 0.5 MG PO TABS: 0.5000 mg | ORAL_TABLET | Freq: Every evening | ORAL | Status: DC | PRN

## 2012-12-06 NOTE — ED Notes (Signed)
Pt c/o CP/SOB and HA x 1 week; pt SOB but speaking complete sentences; pt noted to be diaphoretic

## 2012-12-06 NOTE — ED Provider Notes (Signed)
History     CSN: [id number]  Arrival date & time 12/06/12  0907   First MD Initiated Contact with Patient 12/06/12 1116      Chief Complaint  Patient presents with  . Chest Pain  . Shortness of Breath  . Headache    (Consider location/radiation/quality/duration/timing/severity/associated sxs/prior treatment) HPI Comments: Pt with PMH significant for HTN, LVH, bipolar d/o, depression, polysubstance abuse, presents to the ED for chest pain and SOB for the past week.  Patient was seen in the ED 10 days ago for the same- work up was negative at that time. Pain has persisted and is described as centralized, nonradiating chest pressure.  Also notes right anterior rib pain, exacerbated by deep breathing and movement.  No recent injury or trauma. Associated sx include SOB and productive cough with yellow/green mucus. Patient denies any recent fever, sweats, chills, or sick contacts.  Patient also complaining of frontal headache associated with a sensation of congestion and sinus pressure.  Patient initially thought headache was due to her blood pressure, however since it has not resolved she believes it is from another source.  Patient appears very anxious, requesting Xanax which she takes nightly but notes she has recently run out.  No LE edema, calf tenderness, recent travel, surgeries, or prolonged periods of immbolization.  Pt has not followed up with her PCP from prior ED visit.  The history is provided by the patient.    Past Medical History  Diagnosis Date  . Fibroids     now s/p hysterectomy  . Left ventricular hypertrophy   . Polysubstance abuse     IV drug [place], cocaine on occassion, smoking and alcoholism, has been going to AA, not completely sober  . Hypertension   . bipolar   . Depression     Past Surgical History  Procedure Laterality Date  . Abdominal hysterectomy      2003  . Exploratory laparotomy      complex pelvic mass 2011  . Bilateral salpingoophorectomy     01/2010    Family History  Problem Relation Age of Onset  . Hypertension Mother   . Hypertension Sister   . Diabetes Father     History  Substance Use Topics  . Smoking status: Current Every Day Smoker -- 0.50 packs/day for 16 years    Types: Cigarettes  . Smokeless tobacco: Never Used     Comment: Wants to quit.  Cutting back - smokes mainly at night.  . Alcohol Use: Yes     Comment: Occasional drinker    OB History   Grav Para Term Preterm Abortions TAB SAB Ect Mult Living   5 0 0 0 4 0 1 0 0 0       Review of Systems  Respiratory: Positive for cough and shortness of breath.   Cardiovascular: Positive for chest pain.  Musculoskeletal: Positive for arthralgias.  All other systems reviewed and are negative.    Allergies  Review of patient's allergies indicates no known allergies.  Home Medications   Current Outpatient Rx  Name  Route  Sig  Dispense  Refill  . ALPRAZolam (XANAX) 0.5 MG tablet   Oral   Take 1 tablet (0.5 mg total) by mouth at bedtime as needed for sleep.   30 tablet   0   . atenolol (TENORMIN) 50 MG tablet   Oral   Take 1 tablet (50 mg total) by mouth daily.   30 tablet   3   . DiphenhydrAMINE HCl,  Sleep, 50 MG tablet   Oral   Take 1 tablet (50 mg total) by mouth at bedtime as needed for sleep.   30 tablet   1   . Diphenhydramine-APAP, sleep, (TYLENOL PM EXTRA STRENGTH PO)   Oral   Take 1 tablet by mouth at bedtime as needed. For sleep         . FLUoxetine (PROZAC) 20 MG capsule   Oral   Take 1 capsule (20 mg total) by mouth daily.   90 capsule   1   . hydrochlorothiazide (HYDRODIURIL) 12.5 MG tablet   Oral   Take 12.5 mg by mouth daily.         [name] ibuprofen (ADVIL,MOTRIN) 800 MG tablet   Oral   Take 1 tablet (800 mg total) by mouth every 6 (six) hours as needed for pain.   60 tablet   1   . lisinopril (PRINIVIL,ZESTRIL) 20 MG tablet   Oral   Take 1 tablet (20 mg total) by mouth daily.   90 tablet   2     BP 132/73   Pulse 63  Temp(Src) 98.1 F (36.7 C) (Oral)  Resp 20  SpO2 96%  LMP 12/04/2009  Physical Exam  Nursing note and vitals reviewed. Constitutional: She is oriented to person, place, and time. She appears well-developed and well-nourished.  HENT:  Head: Normocephalic and atraumatic.  Right Ear: Tympanic membrane and ear canal normal.  Left Ear: Tympanic membrane and ear canal normal.  Nose: Mucosal edema present. Right sinus exhibits maxillary sinus tenderness and frontal sinus tenderness. Left sinus exhibits maxillary sinus tenderness and frontal sinus tenderness.  Mouth/Throat: Oropharynx is clear and moist and mucous membranes are normal. No edematous. No oropharyngeal exudate, posterior oropharyngeal edema, posterior oropharyngeal erythema or tonsillar abscesses.  Turbinates swollen and erythematous, frontal and maxillary sinus tenderness  Eyes: Conjunctivae and EOM are normal. Pupils are equal, round, and reactive to light.  Neck: Normal range of motion.  Cardiovascular: Normal rate, regular rhythm and normal heart sounds.   Pulmonary/Chest: Effort normal and breath sounds normal.    Mild TTP of right anterior ribs, no ecchymosis, swelling or deformity, lungs CTAB  Abdominal: Soft. Bowel sounds are normal.  Musculoskeletal: Normal range of motion. She exhibits no edema.  Neurological: She is alert and oriented to person, place, and time.  Skin: Skin is warm and dry.  Psychiatric: Her mood appears anxious. Her speech is rapid and/or pressured.    ED Course  Procedures (including critical care time)   Date: 12/06/2012  Rate: 63  Rhythm: normal sinus rhythm  QRS Axis: normal  Intervals: normal  ST/T Wave abnormalities: normal  Conduction Disutrbances:none  Narrative Interpretation: LVH, borderline RBBB, no STEMI  Old EKG Reviewed: unchanged    Labs Reviewed  BASIC METABOLIC PANEL - Abnormal; Notable for the following:    Glucose, Bld 137 (*)    GFR calc non Af Amer 71  (*)    GFR calc Af Amer 82 (*)    All other components within normal limits  CBC  D-DIMER, QUANTITATIVE  PRO B NATRIURETIC PEPTIDE  POCT I-STAT TROPONIN I  POCT I-STAT TROPONIN I   Dg Chest 2 View  12/06/2012  *RADIOLOGY REPORT*  Clinical Data: Shortness of breath, chest pain.  CHEST - 2 VIEW  Comparison: 07/13/2011  Findings: Heart and mediastinal contours are within normal limits. No focal opacities or effusions.  No acute bony abnormality.  IMPRESSION: No active cardiopulmonary disease.   Original Report Authenticated  By: Charlett Nose, M.D.      1. Chest pain   2. Headache   3. Nasal congestion   4. Anxiety       MDM   Pt presenting to the ED for centralized chest pain and pressure- previous visit 10 days ago for the same.  Right rib pain without acute injury or trauma, assoc with cough, SOB.  EKG NSR with LVH, trop x2 negative. Labs largely WNL. Low suspicion for ACS, PE, or aortic dissection.    Chest pain improved with admin of xanax- strong suspicion that CP is anxiety related.  Right rib pain likely MSK in nature induced by heavy bouts of coughing.  Rx amoxicillin, flonase.  Short refill of xanax.  FU with PCP to review today's visit and for further refills. Discussed plan with pt- she agreed.  Discussed with Dr. Ranae Palms who agrees with plan.  Return precautions advised.         Garlon Hatchet, PA-C 12/07/12 1348

## 2012-12-07 ENCOUNTER — Telehealth (HOSPITAL_COMMUNITY): Payer: Self-pay | Admitting: Emergency Medicine

## 2012-12-07 NOTE — ED Notes (Signed)
Pharmacy calling pt does not want to fill Flonase at this time.  Informed pharmacy pt needs to have abx filled (having filled @ [facility]) and may get Xanax without getting Flonase filled at this time

## 2012-12-07 NOTE — ED Provider Notes (Signed)
Medical screening examination/treatment/procedure(s) were performed by non-physician practitioner and as supervising physician I was immediately available for consultation/collaboration.   Amilyah Nack, MD 12/07/12 1741 

## 2012-12-21 ENCOUNTER — Emergency Department (HOSPITAL_COMMUNITY)
Admission: EM | Admit: 2012-12-21 | Discharge: 2012-12-21 | Discharge status: Against Medical Advice | Payer: Self-pay | Attending: Emergency Medicine | Admitting: Emergency Medicine

## 2012-12-21 ENCOUNTER — Encounter (HOSPITAL_COMMUNITY): Payer: Self-pay | Admitting: Emergency Medicine

## 2012-12-21 DIAGNOSIS — R42 Dizziness and giddiness: Secondary | ICD-10-CM | POA: Insufficient documentation

## 2012-12-21 DIAGNOSIS — Z8742 Personal history of other diseases of the female genital tract: Secondary | ICD-10-CM | POA: Insufficient documentation

## 2012-12-21 DIAGNOSIS — F329 Major depressive disorder, single episode, unspecified: Secondary | ICD-10-CM | POA: Insufficient documentation

## 2012-12-21 DIAGNOSIS — F3289 Other specified depressive episodes: Secondary | ICD-10-CM | POA: Insufficient documentation

## 2012-12-21 DIAGNOSIS — I10 Essential (primary) hypertension: Secondary | ICD-10-CM | POA: Insufficient documentation

## 2012-12-21 DIAGNOSIS — F1910 Other psychoactive substance abuse, uncomplicated: Secondary | ICD-10-CM | POA: Insufficient documentation

## 2012-12-21 DIAGNOSIS — F17200 Nicotine dependence, unspecified, uncomplicated: Secondary | ICD-10-CM | POA: Insufficient documentation

## 2012-12-21 DIAGNOSIS — F489 Nonpsychotic mental disorder, unspecified: Secondary | ICD-10-CM | POA: Insufficient documentation

## 2012-12-21 DIAGNOSIS — I517 Cardiomegaly: Secondary | ICD-10-CM | POA: Insufficient documentation

## 2012-12-21 NOTE — ED Notes (Signed)
Pt offered available fast track room to be seen by provider. Pt stated she did not want to been seen by doctor. Only wanted her blood pressure checked because she wasn't feeling well. Walked out of department with steady gait.

## 2012-12-21 NOTE — ED Notes (Signed)
All triage information entered by myself under Jonne Ply name accidentally.

## 2012-12-21 NOTE — ED Notes (Signed)
Pt arrives stating she wants her blood pressure checked. States she has been feeling dizzy and lightheaded for the last hour. States she took her blood pressure medicine PTA.

## 2012-12-28 ENCOUNTER — Encounter: Payer: Self-pay | Admitting: Internal Medicine

## 2013-01-06 ENCOUNTER — Ambulatory Visit (INDEPENDENT_AMBULATORY_CARE_PROVIDER_SITE_OTHER): Payer: No Typology Code available for payment source | Admitting: Internal Medicine

## 2013-01-06 VITALS — BP 146/94 | HR 69 | Temp 98.2°F | Wt 241.1 lb

## 2013-01-06 DIAGNOSIS — Z7251 High risk heterosexual behavior: Secondary | ICD-10-CM

## 2013-01-06 DIAGNOSIS — F3112 Bipolar disorder, current episode manic without psychotic features, moderate: Secondary | ICD-10-CM

## 2013-01-06 DIAGNOSIS — F411 Generalized anxiety disorder: Secondary | ICD-10-CM

## 2013-01-06 DIAGNOSIS — F329 Major depressive disorder, single episode, unspecified: Secondary | ICD-10-CM

## 2013-01-06 LAB — RPR

## 2013-01-06 MED ORDER — ALPRAZOLAM 0.5 MG PO TABS: 0.5000 mg | ORAL_TABLET | Freq: Every evening | ORAL | Status: DC | PRN

## 2013-01-06 MED ORDER — FLUOXETINE HCL 40 MG PO CAPS: 40.0000 mg | ORAL_CAPSULE | Freq: Every day | ORAL | Status: DC

## 2013-01-06 NOTE — Assessment & Plan Note (Addendum)
No symptomatology or evidence of active infection on exam. Will order broad screening for STDs given her concern about unprotected sex. - RPR, HIV Ab, GC/Chlamydia, wet prep  ADDENDUM 01/10/2013 HIV/RPR, GC/Chlam negative. Trich and yeast negative. + for gardnerella; treatment is optional as patient is asymptomatic. Attempted to call pt x2, no answer no VM available.  01/12/2013 Reached pt by phone, she would like Rx for BV as is having a small amt of discharge. Will have Rx called in to [facility].  Counseled patient that she must ABSTAIN from EtOH while on medication. She is in agreement.

## 2013-01-06 NOTE — Assessment & Plan Note (Addendum)
Pt follows with Mental health and is on prozac 20mg  daily. Has been prescribed xanax recently for anxiety and as sleep aid. I think she would benefit from higher dose of prozac 20mg -->40mg . Says she will follow up with mental health regarding these medications. - in the meantime, will provide 30 tablets of 0.5mg  xanax. Told her could not refill this medication and she would need to get additional medications from her mental health provider.   ADDENDUM 01/12/2013  Called pt to f/u on results from testing from 5/29. She said Xanax is not controlling her anxiety and she is asking for more potent dose of BZP. I told her as I did last week that she is to followup with her mental health providers I'm not comfortable providing anything stronger at this time. She's been seen by [facility] in the past. She is frustrated by the waiting time there. I told her that day accept walk-in patients from 8 AM to 3 PM and she needs to be seen by her provider soon as possible. She is in agreement to go.

## 2013-01-06 NOTE — Progress Notes (Signed)
[name], female   DOB: 26-May-1961, 52 y.o.   MRN: [id number]  Subjective:   [name] female   DOB: 31-Oct-1960 52 y.o.   MRN: [id number]  HPI: [name] is a 52 y.o. female with history of HTN, uterine fibroids s/p TAH, and anxiety presenting with concerns after unprotected intercourse.  Patient reports that 3 weeks ago she had unprotected intercourse with a female partner. Since that time, she's been worried about possibly contracting a sexually transmitted illness. She denies any vaginal pain, discharge, or odor. She's not had any pelvic pain, fever, or chills. There no symptoms to suggest sexually-transmitted infection, however patient would like to be broadly screening today. In the past, she has been treated for syphilis and gonorrhea. She also concerned about her anxiety disorder. She is taking Prozac for depression and 20 mg daily and has been treated intermittently with Xanax. She is out of Xanax and would like a prescription today. She says that it helps her sleep at night. She denies any suicidal ideation or homicidal ideation. She denies any auditory or visual hallucinations. She reports compliance with blood pressure medications including lisinopril, hydrochlorothiazide, and atenolol.  Denies any chest pain, shortness of breath, palpitations, abdominal pain, nausea, vomiting, diarrhea, dysuria, blood in urine or stool.   Past Medical History  Diagnosis Date  . Fibroids     now s/p hysterectomy  . Left ventricular hypertrophy   . Polysubstance abuse     IV drug [place], cocaine on occassion, smoking and alcoholism, has been going to AA, not completely sober  . Hypertension   . bipolar   . Depression    Current Outpatient Prescriptions  Medication Sig Dispense Refill  . ALPRAZolam (XANAX) 0.5 MG tablet Take 1 tablet (0.5 mg total) by mouth at bedtime as needed for sleep.  30 tablet  0  . atenolol (TENORMIN) 50 MG tablet Take 1 tablet (50  mg total) by mouth daily.  30 tablet  3  . FLUoxetine (PROZAC) 40 MG capsule Take 1 capsule (20 mg total) by mouth daily.  90 capsule  1  . fluticasone (FLONASE) 50 MCG/ACT nasal spray Place 1 spray into the nose daily.  16 g  0  . hydrochlorothiazide (HYDRODIURIL) 12.5 MG tablet Take 12.5 mg by mouth daily.      [name] lisinopril (PRINIVIL,ZESTRIL) 20 MG tablet Take 1 tablet (20 mg total) by mouth daily.  90 tablet  2   No current facility-administered medications for this visit.   Family History  Problem Relation Age of Onset  . Hypertension Mother   . Hypertension Sister   . Diabetes Father    History   Social History  . Marital Status: Single    Spouse Name: N/A    Number of Children: N/A  . Years of Education: N/A   Social History Main Topics  . Smoking status: Current Every Day Smoker -- 0.50 packs/day for 16 years    Types: Cigarettes  . Smokeless tobacco: Never Used     Comment: Wants to quit.  Cutting back - smokes mainly at night.  . Alcohol Use: Yes     Comment: Occasional drinker  . Drug Use: No  . Sexually Active: Not on file   Other Topics Concern  . Not on file   Social History Narrative   Financial assistance approved for 100% discount at [facility] and has [facility] card per [employer]   07/19/2010  Review of Systems: 10 pt ROS performed, pertinent positives and negatives noted in HPI Objective:  Physical Exam: Filed Vitals:   01/06/13 1340 01/06/13 1424  BP:  146/94  Pulse: 70 69  Temp: 98.2 F (36.8 C)   TempSrc: Oral   Weight: 241 lb 1.6 oz (109.362 kg)   SpO2: 98%    Constitutional: Vital signs reviewed.  Patient is an anxious appearing female.  Alert and oriented x3.  Head: Normocephalic and atraumatic Mouth: no erythema or exudates, MMM Eyes: PERRL, EOMI, conjunctivae normal, No scleral icterus.  Neck: Supple, Trachea midline normal ROM Cardiovascular: RRR, S1 normal, S2 normal, no MRG, pulses symmetric and intact  bilaterally Pulmonary/Chest: CTAB, no wheezes, rales, or rhonchi Abdominal: Soft. Non-tender, non-distended, bowel sounds are normal, no masses, organomegaly, or guarding present.  GU: Cervix is surgically absent. There is a small amount of white, odorless discharge in the vagina. No erythema or blood visualized.  Neurological: A&O x3, Strength is normal and symmetric bilaterally, cranial nerve II-XII are grossly intact, no focal motor deficit, sensory intact to light touch bilaterally.  Psychiatric: Animated with some racing thoughts.   Assessment & Plan:   Please see problem-based charting for assessment and plan.

## 2013-01-06 NOTE — Patient Instructions (Signed)
1. Increase your prozac to 40mg  daily. 2. Keep taking blood pressure medicines. 3. Come back to see your primary care doctor in 2-3 months

## 2013-01-07 LAB — HIV ANTIBODY (ROUTINE TESTING W REFLEX): HIV: NONREACTIVE

## 2013-01-07 NOTE — Progress Notes (Signed)
Case discussed with Dr. Ziemer at the time of the visit, immediately after the resident saw the patient.  I reviewed the resident's history and exam and pertinent patient test results.  I agree with the assessment, diagnosis and plan of care documented in the resident's note.      

## 2013-01-12 ENCOUNTER — Telehealth: Payer: Self-pay | Admitting: *Deleted

## 2013-01-12 MED ORDER — METRONIDAZOLE 500 MG PO TABS: 500.0000 mg | ORAL_TABLET | Freq: Two times a day (BID) | ORAL | Status: DC

## 2013-01-12 NOTE — Addendum Note (Signed)
Addended by: Ignacia Palma on: 01/12/2013 11:34 AM   Modules accepted: Orders

## 2013-01-12 NOTE — Telephone Encounter (Signed)
Called pt to discuss results and called in Rx for metronidazole for BV. Thanks CZ

## 2013-01-12 NOTE — Telephone Encounter (Signed)
Prescription for Flagyl 500 mg 1 po bid x 7 days dispense # 14 with no refills was called to the [facility] Department per order of Dr. Loura Pardon.  Angelina Ok, RN 01/12/2013 11:38 AM

## 2013-01-12 NOTE — Telephone Encounter (Signed)
Pt calls and would like md to call her today with results from last visit and discussion, ph#[phone]

## 2013-01-18 ENCOUNTER — Encounter: Payer: Self-pay | Admitting: *Deleted

## 2013-01-18 NOTE — Progress Notes (Unsigned)
Pt presents c/o rash bil mid arms x 2 weeks, itches sometimes, small raised closed rash. appt scheduled for 1 week, asked to go to urg care if it becomes worse

## 2013-01-18 NOTE — Progress Notes (Signed)
ok 

## 2013-01-19 ENCOUNTER — Other Ambulatory Visit: Payer: Self-pay

## 2013-01-19 DIAGNOSIS — Z1231 Encounter for screening mammogram for malignant neoplasm of breast: Secondary | ICD-10-CM

## 2013-01-25 ENCOUNTER — Ambulatory Visit: Payer: No Typology Code available for payment source | Admitting: Internal Medicine

## 2013-01-28 ENCOUNTER — Ambulatory Visit: Payer: No Typology Code available for payment source | Admitting: Internal Medicine

## 2013-02-14 ENCOUNTER — Other Ambulatory Visit: Payer: Self-pay | Admitting: *Deleted

## 2013-02-14 MED ORDER — ATENOLOL 50 MG PO TABS: 50.0000 mg | ORAL_TABLET | Freq: Every day | ORAL | Status: DC

## 2013-02-14 NOTE — Telephone Encounter (Signed)
Needs F/U appt with PCP to F/U BP. This month - likely will need to be [facility] slot with Dr Claudell Kyle

## 2013-02-15 ENCOUNTER — Encounter: Payer: Self-pay | Admitting: Internal Medicine

## 2013-02-21 ENCOUNTER — Ambulatory Visit: Payer: No Typology Code available for payment source

## 2013-03-03 ENCOUNTER — Encounter (HOSPITAL_COMMUNITY): Payer: Self-pay | Admitting: Emergency Medicine

## 2013-03-03 ENCOUNTER — Emergency Department (HOSPITAL_COMMUNITY)
Admission: EM | Admit: 2013-03-03 | Discharge: 2013-03-03 | Disposition: A | Discharge status: Home / Self Care | Payer: Self-pay | Attending: Emergency Medicine | Admitting: Emergency Medicine

## 2013-03-03 ENCOUNTER — Emergency Department (INDEPENDENT_AMBULATORY_CARE_PROVIDER_SITE_OTHER)
Admission: EM | Admit: 2013-03-03 | Discharge: 2013-03-03 | Disposition: A | Discharge status: Home / Self Care | Payer: No Typology Code available for payment source | Source: Home / Self Care

## 2013-03-03 DIAGNOSIS — G479 Sleep disorder, unspecified: Secondary | ICD-10-CM | POA: Insufficient documentation

## 2013-03-03 DIAGNOSIS — Z8679 Personal history of other diseases of the circulatory system: Secondary | ICD-10-CM | POA: Insufficient documentation

## 2013-03-03 DIAGNOSIS — Z8742 Personal history of other diseases of the female genital tract: Secondary | ICD-10-CM | POA: Insufficient documentation

## 2013-03-03 DIAGNOSIS — F909 Attention-deficit hyperactivity disorder, unspecified type: Secondary | ICD-10-CM | POA: Insufficient documentation

## 2013-03-03 DIAGNOSIS — I10 Essential (primary) hypertension: Secondary | ICD-10-CM | POA: Insufficient documentation

## 2013-03-03 DIAGNOSIS — F1910 Other psychoactive substance abuse, uncomplicated: Secondary | ICD-10-CM | POA: Insufficient documentation

## 2013-03-03 DIAGNOSIS — F419 Anxiety disorder, unspecified: Secondary | ICD-10-CM

## 2013-03-03 DIAGNOSIS — F17200 Nicotine dependence, unspecified, uncomplicated: Secondary | ICD-10-CM | POA: Insufficient documentation

## 2013-03-03 DIAGNOSIS — F319 Bipolar disorder, unspecified: Secondary | ICD-10-CM

## 2013-03-03 DIAGNOSIS — Z79899 Other long term (current) drug therapy: Secondary | ICD-10-CM | POA: Insufficient documentation

## 2013-03-03 DIAGNOSIS — F411 Generalized anxiety disorder: Secondary | ICD-10-CM | POA: Insufficient documentation

## 2013-03-03 DIAGNOSIS — F3112 Bipolar disorder, current episode manic without psychotic features, moderate: Secondary | ICD-10-CM

## 2013-03-03 LAB — URINALYSIS, ROUTINE W REFLEX MICROSCOPIC
Bilirubin Urine: NEGATIVE
Glucose, UA: NEGATIVE mg/dL
Hgb urine dipstick: NEGATIVE
Ketones, ur: NEGATIVE mg/dL
Protein, ur: NEGATIVE mg/dL
Urobilinogen, UA: 0.2 mg/dL (ref 0.0–1.0)

## 2013-03-03 LAB — RAPID URINE DRUG SCREEN, HOSP PERFORMED
Amphetamines: NOT DETECTED
Barbiturates: NOT DETECTED
Tetrahydrocannabinol: NOT DETECTED

## 2013-03-03 MED ORDER — ALPRAZOLAM 0.25 MG PO TABS: 0.5000 mg | ORAL_TABLET | Freq: Every evening | ORAL | Status: DC | PRN

## 2013-03-03 MED ORDER — ALPRAZOLAM 0.25 MG PO TABS
1.0000 mg | ORAL_TABLET | Freq: Once | ORAL | Status: AC
  Administered 2013-03-03: 1 mg via ORAL
  Filled 2013-03-03: qty 4

## 2013-03-03 MED ORDER — HYDROCHLOROTHIAZIDE 25 MG PO TABS: 12.5000 mg | ORAL_TABLET | Freq: Every day | ORAL | Status: DC

## 2013-03-03 MED ORDER — ATENOLOL 25 MG PO TABS: 50.0000 mg | ORAL_TABLET | Freq: Every day | ORAL | Status: DC

## 2013-03-03 MED ORDER — ALPRAZOLAM 0.5 MG PO TABS: 0.5000 mg | ORAL_TABLET | Freq: Every evening | ORAL | Status: DC | PRN

## 2013-03-03 MED ORDER — FLUOXETINE HCL 20 MG PO CAPS: 40.0000 mg | ORAL_CAPSULE | Freq: Every day | ORAL | Status: DC

## 2013-03-03 MED ORDER — LISINOPRIL 20 MG PO TABS: 20.0000 mg | ORAL_TABLET | Freq: Every day | ORAL | Status: DC

## 2013-03-03 NOTE — ED Provider Notes (Signed)
History    CSN: [id number] Arrival date & time 03/03/13  0821  First MD Initiated Contact with Patient 03/03/13 [phone]     Chief Complaint  Patient presents with  . Anxiety   (Consider location/radiation/quality/duration/timing/severity/associated sxs/prior Treatment) HPI  52 yo bf presents today with severe anxiety.  States that this has been getting worse over the last several weeks.  Requesting refill of xanax.  Has a hx of bipolar disorder and polysubstance abuse.  Has been on multiple medications for sleep, anxiety, depression and feels like they have not been helping her.  States that she is followed by [facility] here in [place] and they last refilled her xanax.  Over the last couple of weeks she admits to having suicidal ideation, but none today.  No complaints of chest pain, sob.  Has had a rash on both forearms.  Somewhat pruritic.   Past Medical History  Diagnosis Date  . Fibroids     now s/p hysterectomy  . Left ventricular hypertrophy   . Polysubstance abuse     IV drug [place], cocaine on occassion, smoking and alcoholism, has been going to AA, not completely sober  . Hypertension   . bipolar   . Depression    Past Surgical History  Procedure Laterality Date  . Abdominal hysterectomy      2003  . Exploratory laparotomy      complex pelvic mass 2011  . Bilateral salpingoophorectomy      01/2010   Family History  Problem Relation Age of Onset  . Hypertension Mother   . Hypertension Sister   . Diabetes Father    History  Substance Use Topics  . Smoking status: Current Every Day Smoker -- 0.50 packs/day for 16 years    Types: Cigarettes  . Smokeless tobacco: Never Used     Comment: Wants to quit.  Cutting back - smokes mainly at night.  . Alcohol Use: Yes     Comment: Occasional drinker   OB History   Grav Para Term Preterm Abortions TAB SAB Ect Mult Living   5 0 0 0 4 0 1 0 0 0      Review of Systems  Constitutional: Negative.   Gastrointestinal:  Negative.   Genitourinary: Negative.   Skin: Positive for rash.  Psychiatric/Behavioral: Positive for suicidal ideas (last couple of weeks but denies today.), behavioral problems, sleep disturbance and agitation. The patient is nervous/anxious and is hyperactive.     Allergies  Review of patient's allergies indicates no known allergies.  Home Medications   Current Outpatient Rx  Name  Route  Sig  Dispense  Refill  . ALPRAZolam (XANAX) 0.5 MG tablet   Oral   Take 1 tablet (0.5 mg total) by mouth at bedtime as needed for sleep.   30 tablet   0   . atenolol (TENORMIN) 50 MG tablet   Oral   Take 1 tablet (50 mg total) by mouth daily.   90 tablet   0   . FLUoxetine (PROZAC) 40 MG capsule   Oral   Take 1 capsule (40 mg total) by mouth daily.   90 capsule   1   . fluticasone (FLONASE) 50 MCG/ACT nasal spray   Nasal   Place 1 spray into the nose daily.   16 g   0   . hydrochlorothiazide (HYDRODIURIL) 12.5 MG tablet   Oral   Take 12.5 mg by mouth daily.         [name] lisinopril (PRINIVIL,ZESTRIL) 20  MG tablet   Oral   Take 1 tablet (20 mg total) by mouth daily.   90 tablet   2   . metroNIDAZOLE (FLAGYL) 500 MG tablet   Oral   Take 1 tablet (500 mg total) by mouth 2 (two) times daily. One po bid x 7 days   14 tablet   0    BP 145/100  Pulse 62  Temp(Src) 99.8 F (37.7 C) (Oral)  Resp 18  SpO2 98%  LMP 12/04/2009 Physical Exam  Constitutional: She appears well-developed. She appears distressed.  HENT:  Head: Normocephalic and atraumatic.  Eyes: EOM are normal. Pupils are equal, round, and reactive to light.  Neck: Normal range of motion.  Pulmonary/Chest: Effort normal.  Neurological: She is alert.  Psychiatric:  Very anxious.  Appears to be having a manic episode.      ED Course  Procedures (including critical care time) Labs Reviewed - No data to display No results found. 1. Bipolar disorder, curr episode manic w/o psychotic features, moderate      MDM  Will send patient down to ED for possible psychiatric evaluation.  Patient voices understanding and agrees.  Told her that we cannot just refill xanax script with the way that she currently feels and transfer to ED is best option.    Zonia Kief, PA-C 03/03/13 0945

## 2013-03-03 NOTE — ED Notes (Signed)
Pt c/o anxiety.... Reports she was taking meds given by a psychiatrist at [facility]; does not know the name of the pyciatrist or the names of meds... Reports when her anxiety flares up she becomes very irritated and short temper w/decreased appetite... Also c/o rash on both arms onset 1 month... She is alert w/no signs of acute distress.

## 2013-03-03 NOTE — ED Notes (Addendum)
PT sent from Urgent Care and was sent here to see psychiatrist. PT refusing to talk with RN about specific issues and states she needs to talk to psychiatrist only. She is denying SI or HI. Speech is rapid and tone is combative at times. Unable to complete full psyc screening due to patient's lack of cooperation.

## 2013-03-03 NOTE — ED Provider Notes (Signed)
Medical screening examination/treatment/procedure(s) were performed by a resident physician or non-physician practitioner and as the supervising physician I interviewed and examined the patient.   She appears to be in a manic episode. She denies any active suicidality or homicidality however has expressed passive death wish the past and easy frustration and short temper with people recently.   I feel that it is reasonable to transfer patient to the emergency room for further evaluation and management. The patient is agreeable.  Clementeen Graham, MD   Rodolph Bong, MD 03/03/13 1027

## 2013-03-03 NOTE — ED Provider Notes (Signed)
History    CSN: [id number] Arrival date & time 03/03/13  4098  First MD Initiated Contact with Patient 03/03/13 0957     Chief Complaint  Patient presents with  . Manic Behavior   (Consider location/radiation/quality/duration/timing/severity/associated sxs/prior Treatment) HPI Comments: Pt is a 52yo female who presents with severe anxiety; pt was seen at urgent care and referred here for further eval/management. Pt reports she has had very high levels of anxiety "for several weeks," worse for the past several days. Pt is followed by [facility] in addition to her PCP (internal medicine) for her anxiety/bipolar disorder. Pt states she has been on Xanax in the past, which helps, but she is out for about 1 month. She was seen about one week ago at [facility]; her Prozac was stopped, and she was started on "three medications" which made her anxiety worse, did not help her sleep, and caused her to have upset stomach and some vomiting. Pt states she has been taking several medications including sleep aids and Benadryl but has only slept about 4-6 hours total in 2-3 days. Pt describes her symptoms as some days feeling hopeless/depressed and some days being so "wired and anxious and jittery" that she can't sleep or rest (which is how she currently feels). She also has elevated BP, for which she takes lisinopril and HCTZ, and she has taken these meds, today.  Pt denies symptoms otherwise. No fever/chills, N/V/D, abdominal or chest pain, SOB, cough, coryza-type symptoms, rash, change in bowel or bladder habits, urinary or vaginal symptoms. Pt denies visual or auditory hallucinations. Pt states she has been suicidal "in the past," but her feelings of wanting to harm herself "go away quickly" when they come, and she denies current desire to harm herself or others.   The history is provided by the patient. No language interpreter was used.   Past Medical History  Diagnosis Date  . Fibroids      now s/p hysterectomy  . Left ventricular hypertrophy   . Polysubstance abuse     IV drug [place], cocaine on occassion, smoking and alcoholism, has been going to AA, not completely sober  . Hypertension   . bipolar   . Depression    Past Surgical History  Procedure Laterality Date  . Abdominal hysterectomy      2003  . Exploratory laparotomy      complex pelvic mass 2011  . Bilateral salpingoophorectomy      01/2010   Family History  Problem Relation Age of Onset  . Hypertension Mother   . Hypertension Sister   . Diabetes Father    History  Substance Use Topics  . Smoking status: Current Every Day Smoker -- 0.50 packs/day for 16 years    Types: Cigarettes  . Smokeless tobacco: Never Used     Comment: Wants to quit.  Cutting back - smokes mainly at night.  . Alcohol Use: Yes     Comment: Occasional drinker   OB History   Grav Para Term Preterm Abortions TAB SAB Ect Mult Living   5 0 0 0 4 0 1 0 0 0      Review of Systems  Constitutional: Positive for activity change. Negative for fever, chills, diaphoresis and appetite change.  HENT: Negative for congestion, sore throat, rhinorrhea and neck stiffness.   Eyes: Negative for photophobia and visual disturbance.  Respiratory: Negative for cough, shortness of breath and wheezing.   Cardiovascular: Negative for chest pain, palpitations and leg swelling.  Gastrointestinal: Negative  for nausea, vomiting, abdominal pain, diarrhea and constipation.  Endocrine: Negative for polydipsia and polyuria.  Genitourinary: Negative for dysuria, urgency, frequency and flank pain.  Musculoskeletal: Negative for back pain and arthralgias.  Skin: Negative for rash and wound.  Neurological: Negative for dizziness, seizures, syncope, weakness and numbness.  Psychiatric/Behavioral: Positive for behavioral problems, sleep disturbance, dysphoric mood and agitation. Negative for suicidal ideas, hallucinations and confusion. The patient is nervous/anxious  and is hyperactive.     Allergies  Review of patient's allergies indicates no known allergies.  Home Medications   Current Outpatient Rx  Name  Route  Sig  Dispense  Refill  . ALPRAZolam (XANAX) 0.5 MG tablet   Oral   Take 1 tablet (0.5 mg total) by mouth at bedtime as needed for sleep.   15 tablet   0   . atenolol (TENORMIN) 50 MG tablet   Oral   Take 1 tablet (50 mg total) by mouth daily.   90 tablet   0   . FLUoxetine (PROZAC) 40 MG capsule   Oral   Take 1 capsule (40 mg total) by mouth daily.   90 capsule   1   . fluticasone (FLONASE) 50 MCG/ACT nasal spray   Nasal   Place 1 spray into the nose daily.   16 g   0   . hydrochlorothiazide (HYDRODIURIL) 12.5 MG tablet   Oral   Take 12.5 mg by mouth daily.         [name] lisinopril (PRINIVIL,ZESTRIL) 20 MG tablet   Oral   Take 1 tablet (20 mg total) by mouth daily.   90 tablet   2   . metroNIDAZOLE (FLAGYL) 500 MG tablet   Oral   Take 1 tablet (500 mg total) by mouth 2 (two) times daily. One po bid x 7 days   14 tablet   0    BP 170/103  Pulse 59  Temp(Src) 98.6 F (37 C) (Oral)  Resp 20  Ht 5\' 8"  (1.727 m)  Wt 200 lb (90.719 kg)  BMI 30.42 kg/m2  SpO2 98%  LMP 12/04/2009 Physical Exam  Nursing note and vitals reviewed. Constitutional: She is oriented to person, place, and time. She appears well-developed and well-nourished. She appears distressed.  HENT:  Head: Normocephalic and atraumatic.  Eyes: Conjunctivae are normal. Pupils are equal, round, and reactive to light. No scleral icterus.  Neck: Normal range of motion. Neck supple.  Cardiovascular: Normal rate, regular rhythm, normal heart sounds and intact distal pulses.   No murmur heard. Pulmonary/Chest: Effort normal and breath sounds normal. No respiratory distress. She has no wheezes.  Abdominal: Soft. Bowel sounds are normal. She exhibits no distension. There is no tenderness.  Musculoskeletal: Normal range of motion. She exhibits no edema  and no tenderness.  Neurological: She is alert and oriented to person, place, and time. She has normal strength.  Skin: Skin is warm and dry. No rash noted. She is not diaphoretic. No erythema.  Psychiatric: Her mood appears anxious. Her speech is rapid and/or pressured. She is agitated. She is not actively hallucinating and not combative. Thought content is not paranoid and not delusional. Cognition and memory are not impaired. She expresses impulsivity. She expresses no homicidal and no suicidal ideation.  Very rapid speech, difficult to focus on initial interview/exam. Reexamined with Dr. Rhunette Croft and much calmer/less pressured speech but still acutely anxious    ED Course  Procedures (including critical care time)  1031: Exam as above. Very anxious acutely, concern  for acute mania. BP elevated but pt in no distress. Discussed with Dr. Rhunette Croft. Will re-eval and will consider ACT team/tele psych evaluation.  1050: Re-evaluated with Dr. Rhunette Croft. Pt does not appear to be a danger to herself or others. Much calmer on re-exam, but still acutely anxious. Discussed options of medical clearance, ACT team/telepsych eval, and possible inpt management vs outpt recommendations, or having pt follow up closely with her PCP and current outpt providers. Pt requests outpt provider follow up. UA unremarkable, UDS pending at this time.  Labs Reviewed  URINALYSIS, ROUTINE W REFLEX MICROSCOPIC  URINE RAPID DRUG SCREEN (HOSP PERFORMED)   No results found. 1. Acute anxiety   2. Bipolar disorder   3. Essential hypertension, benign   4. Anxiety state, unspecified     MDM  52yo female with acute anxiety. Not a treat to herself or others. Various options discussed with pt, as above. Will give Xanax x1 here and refill for 1 week. Pt to f/u closely with PCP and outpt psychiatrist. Strict return precautions discussed.  The above was discussed in its entirety with attending ED physician Dr. Rhunette Croft.    Bobbye Morton, MD  PGY-2, [facility]  Bobbye Morton, MD 03/03/13 1105  Stephanie Coup Street, MD 03/03/13 1106  I performed a history and physical examination of  Rutherford Limerick and discussed her management with Dr. Casper Harrison. I agree with the history, physical, assessment, and plan of care, with the following exceptions: None I was present for the following procedures: None  Time Spent in Critical Care of the patient: None  Time spent in discussions with the patient and family: 5 minutes  Ayleen Mckinstry   Derwood Kaplan, MD 03/07/13 0003

## 2013-03-15 ENCOUNTER — Ambulatory Visit
Admission: RE | Admit: 2013-03-15 | Discharge: 2013-03-15 | Disposition: A | Discharge status: Home / Self Care | Payer: No Typology Code available for payment source | Source: Ambulatory Visit

## 2013-03-15 DIAGNOSIS — Z1231 Encounter for screening mammogram for malignant neoplasm of breast: Secondary | ICD-10-CM

## 2013-04-14 ENCOUNTER — Emergency Department (INDEPENDENT_AMBULATORY_CARE_PROVIDER_SITE_OTHER)
Admission: EM | Admit: 2013-04-14 | Discharge: 2013-04-14 | Disposition: A | Discharge status: Home / Self Care | Payer: No Typology Code available for payment source | Source: Home / Self Care

## 2013-04-14 ENCOUNTER — Encounter (HOSPITAL_COMMUNITY): Payer: Self-pay | Admitting: *Deleted

## 2013-04-14 DIAGNOSIS — K047 Periapical abscess without sinus: Secondary | ICD-10-CM

## 2013-04-14 DIAGNOSIS — I878 Other specified disorders of veins: Secondary | ICD-10-CM

## 2013-04-14 DIAGNOSIS — I872 Venous insufficiency (chronic) (peripheral): Secondary | ICD-10-CM

## 2013-04-14 MED ORDER — TRAMADOL HCL 50 MG PO TABS: 50.0000 mg | ORAL_TABLET | Freq: Four times a day (QID) | ORAL | Status: DC | PRN

## 2013-04-14 MED ORDER — PENICILLIN V POTASSIUM 500 MG PO TABS: 500.0000 mg | ORAL_TABLET | Freq: Four times a day (QID) | ORAL | Status: AC

## 2013-04-14 MED ORDER — IBUPROFEN 800 MG PO TABS
ORAL_TABLET | ORAL | Status: AC
  Filled 2013-04-14: qty 1

## 2013-04-14 MED ORDER — IBUPROFEN 800 MG PO TABS
800.0000 mg | ORAL_TABLET | Freq: Once | ORAL | Status: AC
  Administered 2013-04-14: 800 mg via ORAL

## 2013-04-14 NOTE — ED Notes (Signed)
Pt  Wants  To take  The  Motrin and  Ace  Bandage  When  She  Gets  Home

## 2013-04-14 NOTE — ED Notes (Signed)
Pt  Reports  r  Sided  Jaw pain       For  sev days    She  Reports  The  Pain  Radiates  To     r  Ear          She  Reports  A  History  Of    Dental  Problems  But  States  Has  Not sought  Attention for same      Pt  Also  Reports  Swelling of the  L  Foot     As  Well  As  Shortness  Of  Breath on  Exertion

## 2013-04-14 NOTE — ED Provider Notes (Signed)
CSN: [id number]     Arrival date & time 04/14/13  8657 History   None    Chief Complaint  Patient presents with  . Jaw Pain   (Consider location/radiation/quality/duration/timing/severity/associated sxs/prior Treatment) HPI Comments: 52 year old female with history of hypertension, obesity and bipolar disorder. Here with the following complaints: #1 toothache in her right lower jaw. Has had intermittent pain for several months. States she cannot see a dentist but she does not have to $30 co-pay. Pain has been worse and throbbing in the last 3 days. No spontaneous drainage. No facial swelling.  #2 foot swelling worse on the left side. Intermittently for months. Patient states she has gained weight in the last year and is currently 200 pounds and the swelling is worse at the end of the day. Denies current shortness of breath or chest pain. No PND. She does take HCTZ 12.5 mg daily.   Past Medical History  Diagnosis Date  . Fibroids     now s/p hysterectomy  . Left ventricular hypertrophy   . Polysubstance abuse     IV drug [place], cocaine on occassion, smoking and alcoholism, has been going to AA, not completely sober  . Hypertension   . bipolar   . Depression    Past Surgical History  Procedure Laterality Date  . Abdominal hysterectomy      2003  . Exploratory laparotomy      complex pelvic mass 2011  . Bilateral salpingoophorectomy      01/2010   Family History  Problem Relation Age of Onset  . Hypertension Mother   . Hypertension Sister   . Diabetes Father    History  Substance Use Topics  . Smoking status: Current Every Day Smoker -- 0.50 packs/day for 16 years    Types: Cigarettes  . Smokeless tobacco: Never Used     Comment: Wants to quit.  Cutting back - smokes mainly at night.  . Alcohol Use: Yes     Comment: Occasional drinker   OB History   Grav Para Term Preterm Abortions TAB SAB Ect Mult Living   5 0 0 0 4 0 1 0 0 0      Review of Systems  Constitutional:  Negative for fever, chills and appetite change.  HENT: Positive for dental problem. Negative for congestion, facial swelling and rhinorrhea.   Respiratory: Negative for shortness of breath.   Cardiovascular: Positive for leg swelling. Negative for chest pain and palpitations.  Gastrointestinal: Negative for nausea, vomiting and abdominal pain.  Musculoskeletal: Negative for arthralgias.  Skin: Negative for rash and wound.  Neurological: Positive for headaches. Negative for dizziness.  All other systems reviewed and are negative.    Allergies  Review of patient's allergies indicates no known allergies.  Home Medications   Current Outpatient Rx  Name  Route  Sig  Dispense  Refill  . ALPRAZolam (XANAX) 0.5 MG tablet   Oral   Take 1 tablet (0.5 mg total) by mouth at bedtime as needed for sleep.   15 tablet   0   . atenolol (TENORMIN) 50 MG tablet   Oral   Take 1 tablet (50 mg total) by mouth daily.   90 tablet   0   . FLUoxetine (PROZAC) 40 MG capsule   Oral   Take 1 capsule (40 mg total) by mouth daily.   90 capsule   1   . fluticasone (FLONASE) 50 MCG/ACT nasal spray   Nasal   Place 1 spray into the nose daily.  16 g   0   . hydrochlorothiazide (HYDRODIURIL) 12.5 MG tablet   Oral   Take 12.5 mg by mouth daily.         [name] lisinopril (PRINIVIL,ZESTRIL) 20 MG tablet   Oral   Take 1 tablet (20 mg total) by mouth daily.   90 tablet   2   . penicillin v potassium (VEETID) 500 MG tablet   Oral   Take 1 tablet (500 mg total) by mouth 4 (four) times daily.   40 tablet   0   . traMADol (ULTRAM) 50 MG tablet   Oral   Take 1 tablet (50 mg total) by mouth every 6 (six) hours as needed for pain.   20 tablet   0    BP 149/102  Pulse 70  Temp(Src) 98 F (36.7 C) (Oral)  Resp 18  SpO2 95%  LMP 12/04/2009 Physical Exam  Nursing note and vitals reviewed. Constitutional: She is oriented to person, place, and time. She appears well-developed and well-nourished.  No distress.  obese  HENT:  Head: Normocephalic and atraumatic.  Right Ear: External ear normal.  Left Ear: External ear normal.  Mouth/Throat: Oropharynx is clear and moist.  There is a broken posterior molar in right lower jaw with gum swelling and exudate around and in center of the broken tooth. No focal fluctuations or espontaneus drainage.   Neck: Neck supple.  Cardiovascular: Normal rate, regular rhythm and normal heart sounds.  Exam reveals no gallop.   No murmur heard. Pulmonary/Chest: Breath sounds normal. No respiratory distress. She has no wheezes. She has no rales.  Abdominal: Soft.  No ascitis  Musculoskeletal:  Bilateral non tender pitting trace ankle edema worse in left foot.  Lymphadenopathy:    She has no cervical adenopathy.  Neurological: She is alert and oriented to person, place, and time.  Skin: No rash noted. She is not diaphoretic.    ED Course  Procedures (including critical care time) Labs Review Labs Reviewed - No data to display Imaging Review No results found.  MDM   1. Dental abscess   2. Venous stasis of lower extremity    Impress impending edema slightly worse on the left. Recommended compression stockings or intermittent leg wrapping and leg elevation at the end of the day. Obesity contributing. Also recommended followup with her primary care provider if persistent or worsening symptoms for possible cardiac workup. She has normal heart sounds and lungs are clear to auscultation today. Prescribed penicillin and tramadol for a dental problem. Encourage patient to followup with a dentist for definitive treatment. Supportive care and red flags that should prompt return to medical attention discussed with patient and provided in writing.   Sharin Grave, MD 04/15/13 1711

## 2013-05-04 ENCOUNTER — Other Ambulatory Visit: Payer: Self-pay | Admitting: *Deleted

## 2013-05-04 MED ORDER — ATENOLOL 50 MG PO TABS: 50.0000 mg | ORAL_TABLET | Freq: Every day | ORAL | Status: DC

## 2013-05-05 NOTE — Telephone Encounter (Signed)
Rx faxed in.

## 2013-05-06 ENCOUNTER — Inpatient Hospital Stay (HOSPITAL_COMMUNITY)
Admission: AD | Admit: 2013-05-06 | Discharge: 2013-05-06 | Disposition: A | Discharge status: Home / Self Care | Payer: No Typology Code available for payment source | Source: Ambulatory Visit | Attending: Obstetrics & Gynecology | Admitting: Obstetrics & Gynecology

## 2013-05-06 ENCOUNTER — Encounter (HOSPITAL_COMMUNITY): Payer: Self-pay | Admitting: *Deleted

## 2013-05-06 DIAGNOSIS — M545 Low back pain, unspecified: Secondary | ICD-10-CM | POA: Insufficient documentation

## 2013-05-06 DIAGNOSIS — M5431 Sciatica, right side: Secondary | ICD-10-CM

## 2013-05-06 DIAGNOSIS — M79609 Pain in unspecified limb: Secondary | ICD-10-CM | POA: Insufficient documentation

## 2013-05-06 DIAGNOSIS — M5430 Sciatica, unspecified side: Secondary | ICD-10-CM | POA: Insufficient documentation

## 2013-05-06 LAB — URINALYSIS, ROUTINE W REFLEX MICROSCOPIC
Bilirubin Urine: NEGATIVE
Ketones, ur: NEGATIVE mg/dL
Leukocytes, UA: NEGATIVE
Nitrite: NEGATIVE
Protein, ur: NEGATIVE mg/dL
Urobilinogen, UA: 0.2 mg/dL (ref 0.0–1.0)
pH: 6 (ref 5.0–8.0)

## 2013-05-06 MED ORDER — KETOROLAC TROMETHAMINE 60 MG/2ML IM SOLN
60.0000 mg | Freq: Once | INTRAMUSCULAR | Status: AC
  Administered 2013-05-06: 60 mg via INTRAMUSCULAR
  Filled 2013-05-06: qty 2

## 2013-05-06 MED ORDER — IBUPROFEN 800 MG PO TABS: 800.0000 mg | ORAL_TABLET | Freq: Three times a day (TID) | ORAL | Status: DC

## 2013-05-06 MED ORDER — OXYCODONE-ACETAMINOPHEN 5-325 MG PO TABS: 1.0000 | ORAL_TABLET | ORAL | Status: DC | PRN

## 2013-05-06 NOTE — MAU Provider Note (Signed)
Attestation of Attending Supervision of Advanced Practitioner (CNM/NP): Evaluation and management procedures were performed by the Advanced Practitioner under my supervision and collaboration.  I have reviewed the Advanced Practitioner's note and chart, and I agree with the management and plan.  HARRAWAY-SMITH, Coreen Shippee 3:29 PM     

## 2013-05-06 NOTE — MAU Note (Signed)
Patient states she has been having lower back pain and pain radiating down her right leg for a couple of weeks. Has been taking Tylenol without relief. States she is having a hard time walking.

## 2013-05-06 NOTE — MAU Note (Signed)
Patient states that she is having trouble walking, sitting, bending and moving in general x 3 months.

## 2013-05-06 NOTE — MAU Provider Note (Signed)
History     CSN: [id number]  Arrival date and time: 05/06/13 1000   First Provider Initiated Contact with Patient 05/06/13 1127      Chief Complaint  Patient presents with  . Back Pain  . Leg Pain   HPI [name] is a 52 y.o. [id number] at who presents to MAU today with complaint of lower back pain and right leg pain. The patient states that the pain has been present x 3 months, but has become much worse lately. She started a new job yesterday and was having difficulty with ambulation and bending because of pain. She states that the pain radiates to her right buttocks and thigh as well. She has numbness in her lateral right thigh. She denies tingling or loss of sensation.   OB History   Grav Para Term Preterm Abortions TAB SAB Ect Mult Living   5 0 0 0 4 0 1 0 0 0       Past Medical History  Diagnosis Date  . Fibroids     now s/p hysterectomy  . Left ventricular hypertrophy   . Polysubstance abuse     IV drug [place], cocaine on occassion, smoking and alcoholism, has been going to AA, not completely sober  . Hypertension   . bipolar   . Depression     Past Surgical History  Procedure Laterality Date  . Abdominal hysterectomy      2003  . Exploratory laparotomy      complex pelvic mass 2011  . Bilateral salpingoophorectomy      01/2010  . Dilation and curettage of uterus      Family History  Problem Relation Age of Onset  . Hypertension Mother   . Hypertension Sister   . Diabetes Father     History  Substance Use Topics  . Smoking status: Current Every Day Smoker -- 0.50 packs/day for 16 years    Types: Cigarettes  . Smokeless tobacco: Never Used     Comment: Wants to quit.  Cutting back - smokes mainly at night.  . Alcohol Use: No     Comment: Occasional drinker    Allergies: No Known Allergies  Prescriptions prior to admission  Medication Sig Dispense Refill  . atenolol (TENORMIN) 50 MG tablet Take 1 tablet (50 mg total) by mouth daily.  30  tablet  3  . hydrochlorothiazide (HYDRODIURIL) 12.5 MG tablet Take 12.5 mg by mouth daily.      [name] lisinopril (PRINIVIL,ZESTRIL) 20 MG tablet Take 1 tablet (20 mg total) by mouth daily.  90 tablet  2    Review of Systems  Constitutional: Negative for fever.  Genitourinary: Negative for dysuria, urgency and frequency.  Musculoskeletal: Positive for back pain.   Physical Exam   Blood pressure 129/92, pulse 64, temperature 99.2 F (37.3 C), temperature source Oral, resp. rate 24, height 5\' 6"  (1.676 m), weight 250 lb 6.4 oz (113.581 kg), last menstrual period 12/04/2009, SpO2 98.00%.  Physical Exam  Constitutional: She is oriented to person, place, and time. She appears well-developed and well-nourished. No distress.  HENT:  Head: Normocephalic and atraumatic.  Cardiovascular: Normal rate, regular rhythm, normal heart sounds and intact distal pulses.   Respiratory: Effort normal and breath sounds normal. No respiratory distress.  GI: Soft. Bowel sounds are normal. She exhibits no distension and no mass. There is no tenderness. There is no rebound and no guarding.  Musculoskeletal:       Lumbar back: She exhibits tenderness and pain.  She exhibits no swelling.       Back:  Neurological: She is alert and oriented to person, place, and time. She has normal strength.  Skin: Skin is warm and dry. No erythema.  Psychiatric: She has a normal mood and affect.    MAU Course  Procedures None  MDM Patient given IM toradol in MAU  Assessment and Plan  A: Sciatic nerve pain  P: Discharge home Rx for ibuprofen and Percocet given Patient advised to call PCP for an appointment. Referral to Sports Medicine and/or physical therapy may be necessary Patient may return to MAU as needed   Freddi Starr, PA-C  05/06/2013, 11:27 AM

## 2013-05-16 ENCOUNTER — Ambulatory Visit: Payer: No Typology Code available for payment source | Admitting: Internal Medicine

## 2013-05-20 ENCOUNTER — Ambulatory Visit (HOSPITAL_COMMUNITY): Payer: No Typology Code available for payment source

## 2013-05-20 ENCOUNTER — Encounter: Payer: Self-pay | Admitting: Internal Medicine

## 2013-05-20 ENCOUNTER — Ambulatory Visit (INDEPENDENT_AMBULATORY_CARE_PROVIDER_SITE_OTHER): Payer: No Typology Code available for payment source | Admitting: Internal Medicine

## 2013-05-20 VITALS — BP 144/89 | HR 75 | Temp 97.7°F | Ht 64.0 in | Wt 251.7 lb

## 2013-05-20 DIAGNOSIS — M7989 Other specified soft tissue disorders: Secondary | ICD-10-CM

## 2013-05-20 DIAGNOSIS — M79609 Pain in unspecified limb: Secondary | ICD-10-CM

## 2013-05-20 DIAGNOSIS — M79604 Pain in right leg: Secondary | ICD-10-CM

## 2013-05-20 LAB — D-DIMER, QUANTITATIVE: D-Dimer, Quant: <0.27 ug/mL-FEU (ref 0.00–0.48)

## 2013-05-20 MED ORDER — HYDROCODONE-ACETAMINOPHEN 2.5-325 MG PO TABS: 1.0000 | ORAL_TABLET | Freq: Four times a day (QID) | ORAL | Status: DC | PRN

## 2013-05-20 MED ORDER — KETOROLAC TROMETHAMINE 60 MG/2ML IM SOLN
60.0000 mg | Freq: Once | INTRAMUSCULAR | Status: AC
Start: 2013-05-20 — End: 2013-05-20
  Administered 2013-05-20: 60 mg via INTRAMUSCULAR

## 2013-05-20 MED ORDER — PANTOPRAZOLE SODIUM 20 MG PO TBEC: 20.0000 mg | DELAYED_RELEASE_TABLET | Freq: Every day | ORAL | Status: DC

## 2013-05-20 MED ORDER — IBUPROFEN 800 MG PO TABS: 800.0000 mg | ORAL_TABLET | Freq: Three times a day (TID) | ORAL | Status: DC | PRN

## 2013-05-20 NOTE — Assessment & Plan Note (Addendum)
The patient presents with a 18-month history of L leg swelling.  She notes no preceding trauma, or other reason for edema.  The patient has a wells score of 2, indicating moderate probability for DVT.  Other considerations include chronic venous insufficiency, versus CHF (though unilateral, and echo 2008 showed EF 65-70%). -Ordered venous Doppler ultrasound this afternoon to rule out DVT -If negative, consider compression stockings for symptomatic relief  Addendum: Patient refused Doppler ultrasound this afternoon, stating that she had to return home to take care of personal matters. I explained the risks of a blood clot, including inducing a pulmonary embolism, which can lead to death. The patient states that she understands these risks, but needs to take care of a family member at home. As such, we have ordered a d-dimer to further clarify her risk of DVT, and have rescheduled her ultrasound for Monday, 10/13.  Addendum: D-dimer returned negative. We will still proceed with duplex ultrasound, but will not prescribe full dose anticoagulation at this time given the negative d-dimer.

## 2013-05-20 NOTE — Assessment & Plan Note (Signed)
The patient notes back pain radiating to right posterior leg, consistent with sciatica.  Symptoms have improved with ibuprofen and toradol injection in ED. -will give another Toradol IM injection 60 mg today -will re-prescribe ibuprofen 800 mg -prescribed protonix for GI prophylaxis while on ibuprofen -will give a few hydrocodone pills for acute pain relief

## 2013-05-20 NOTE — Patient Instructions (Signed)
General Instructions: For your leg swelling, we are ordering an ultrasound of both of your legs.  Please come back to clinic after this test.  For your Sciatica, you may take ibuprofen, 800 mg, up to 3 times per day as needed -while you are taking ibuprofen, take Protonix, 1 tablet daily, to protect your stomach -if you are still in pain, you may use Hydrocodone-Acetaminophen as needed for short-term pain relief only.  This will not be prescribed long-term  Please return for a follow-up visit in 4 weeks.   Treatment Goals:  Goals (1 Years of Data) as of 05/20/13         As of Today 05/06/13 05/06/13 04/14/13 04/14/13     Blood Pressure    . Blood Pressure < 140/90  144/89 129/75 129/92 147/99 149/102      Progress Toward Treatment Goals:  Treatment Goal 05/20/2013  Blood pressure at goal    Self Care Goals & Plans:  Self Care Goal 05/20/2013  Manage my medications take my medicines as prescribed; refill my medications on time  Eat healthy foods eat baked foods instead of fried foods; drink diet soda or water instead of juice or soda  Stop smoking go to the [employer] ([employer]); call [employer] (1-800-QUIT-NOW)    No flowsheet data found.   Care Management & Community Referrals:  Referral 05/20/2013  Referrals made for care management support none needed  Referrals made to community resources -

## 2013-05-20 NOTE — Progress Notes (Signed)
HPI The patient is a 52 y.o. female with a history of HTN, depression, bipolar disorder, presenting for an acute visit for leg/back pain.  On 9/26, the patient went to the ED due to back pain.  The patient describes the pain as a "shooting" lower back, radiating down her right posterior leg.  Symptoms come and go, and are worsened by certain postures as well as walking for extended time, improved by keeping R leg off the ground.  The patient has been taking ibuprofen 800 mg twice/day for pain, which has been helping.  The patient tried taking percocet, but stopped due to GI symptoms.  The patient notes a 50-month history of left foot/ankle swelling.  She notes no preceding trauma, leg pain, or recent immobilization.  She also notes shortness of breath over the last 1 month, which she attributes to decreased exercise activity and dietary indiscretions.    ROS: General: no fevers, chills, changes in weight, changes in appetite Skin: no rash HEENT: no blurry vision, hearing changes, sore throat Pulm: no dyspnea, coughing, wheezing CV: no chest pain, palpitations Abd: no abdominal pain, nausea/vomiting, diarrhea/constipation GU: no dysuria, hematuria, polyuria Ext: see HPI Neuro: see HPI  Filed Vitals:   05/20/13 1349  BP: 144/89  Pulse: 75  Temp: 97.7 F (36.5 C)    PEX General: alert, cooperative, and in no apparent distress HEENT: pupils equal round and reactive to light, vision grossly intact, oropharynx clear and non-erythematous  Neck: supple Lungs: clear to ascultation bilaterally, normal work of respiration, no wheezes, rales, ronchi Heart: regular rate and rhythm, no murmurs, gallops, or rubs Abdomen: soft, non-tender, non-distended, normal bowel sounds Back: low back tenderness to palpation Extremities: left leg with 2+ pitting edema to the level of the mid-shin, no right leg edema Neurologic: alert & oriented X3, cranial nerves II-XII intact, strength grossly intact,  sensation intact to light touch, right straight leg raise test negative  Current Outpatient Prescriptions on File Prior to Visit  Medication Sig Dispense Refill  . atenolol (TENORMIN) 50 MG tablet Take 1 tablet (50 mg total) by mouth daily.  30 tablet  3  . hydrochlorothiazide (HYDRODIURIL) 12.5 MG tablet Take 12.5 mg by mouth daily.      [name] ibuprofen (ADVIL,MOTRIN) 800 MG tablet Take 1 tablet (800 mg total) by mouth 3 (three) times daily.  21 tablet  0  . lisinopril (PRINIVIL,ZESTRIL) 20 MG tablet Take 1 tablet (20 mg total) by mouth daily.  90 tablet  2  . oxyCODONE-acetaminophen (PERCOCET/ROXICET) 5-325 MG per tablet Take 1-2 tablets by mouth every 4 (four) hours as needed for pain.  15 tablet  0   No current facility-administered medications on file prior to visit.    Assessment/Plan

## 2013-05-22 NOTE — Progress Notes (Signed)
Case discussed with Dr. Brown at the time of the visit.  We reviewed the resident's history and exam and pertinent patient test results.  I agree with the assessment, diagnosis, and plan of care documented in the resident's note. 

## 2013-05-23 ENCOUNTER — Ambulatory Visit (HOSPITAL_COMMUNITY): Payer: No Typology Code available for payment source

## 2013-05-24 ENCOUNTER — Ambulatory Visit (HOSPITAL_COMMUNITY): Admission: RE | Admit: 2013-05-24 | Payer: No Typology Code available for payment source | Source: Ambulatory Visit

## 2013-06-01 ENCOUNTER — Ambulatory Visit (HOSPITAL_COMMUNITY): Payer: No Typology Code available for payment source | Attending: Internal Medicine

## 2013-06-03 ENCOUNTER — Other Ambulatory Visit: Payer: Self-pay | Admitting: *Deleted

## 2013-06-03 MED ORDER — LISINOPRIL 20 MG PO TABS: 20.0000 mg | ORAL_TABLET | Freq: Every day | ORAL | Status: DC

## 2013-06-03 MED ORDER — HYDROCHLOROTHIAZIDE 12.5 MG PO TABS: 12.5000 mg | ORAL_TABLET | Freq: Every day | ORAL | Status: DC

## 2013-06-03 NOTE — Telephone Encounter (Signed)
HCTZ and Lisinopril rxs called to [facility].

## 2013-06-15 ENCOUNTER — Other Ambulatory Visit: Payer: Self-pay | Admitting: Internal Medicine

## 2013-06-15 MED ORDER — HYDROCODONE-ACETAMINOPHEN 5-325 MG PO TABS: 0.5000 | ORAL_TABLET | Freq: Four times a day (QID) | ORAL | Status: DC | PRN

## 2013-06-22 ENCOUNTER — Emergency Department (INDEPENDENT_AMBULATORY_CARE_PROVIDER_SITE_OTHER)
Admission: EM | Admit: 2013-06-22 | Discharge: 2013-06-22 | Disposition: A | Discharge status: Home / Self Care | Payer: No Typology Code available for payment source | Source: Home / Self Care | Attending: Emergency Medicine | Admitting: Emergency Medicine

## 2013-06-22 ENCOUNTER — Encounter (HOSPITAL_COMMUNITY): Payer: Self-pay | Admitting: Emergency Medicine

## 2013-06-22 DIAGNOSIS — M5430 Sciatica, unspecified side: Secondary | ICD-10-CM

## 2013-06-22 DIAGNOSIS — M5431 Sciatica, right side: Secondary | ICD-10-CM

## 2013-06-22 MED ORDER — IBUPROFEN 800 MG PO TABS: 800.0000 mg | ORAL_TABLET | Freq: Three times a day (TID) | ORAL | Status: DC | PRN

## 2013-06-22 MED ORDER — HYDROCODONE-ACETAMINOPHEN 7.5-325 MG PO TABS: 1.0000 | ORAL_TABLET | Freq: Three times a day (TID) | ORAL | Status: DC | PRN

## 2013-06-22 MED ORDER — KETOROLAC TROMETHAMINE 60 MG/2ML IM SOLN
60.0000 mg | Freq: Once | INTRAMUSCULAR | Status: AC
  Administered 2013-06-22: 60 mg via INTRAMUSCULAR

## 2013-06-22 MED ORDER — KETOROLAC TROMETHAMINE 60 MG/2ML IM SOLN
INTRAMUSCULAR | Status: AC
  Filled 2013-06-22: qty 2

## 2013-06-22 NOTE — ED Provider Notes (Signed)
CSN: [id number]     Arrival date & time 06/22/13  4098 History   None    Chief Complaint  Patient presents with  . Sciatica   (Consider location/radiation/quality/duration/timing/severity/associated sxs/prior Treatment) HPI Comments: 52 year old female presents complaining of worsening of her sciatica. She has been seen for this by her primary care physician who prescribed ibuprofen and Norco. She has an appointment set up for one week from now and they cannot see her until then. She is here because she says the pain is too severe for her to exercise like she was told to. Chest pain that starts in the right side of her lower back area down her right leg to her ankle. This is made worse with activity. She denies any loss of bowel or bladder control, also denies extremity weakness or numbness. This is been going on for about one month.   Past Medical History  Diagnosis Date  . Fibroids     now s/p hysterectomy  . Left ventricular hypertrophy   . Polysubstance abuse     IV drug [place], cocaine on occassion, smoking and alcoholism, has been going to AA, not completely sober  . Hypertension   . bipolar   . Depression    Past Surgical History  Procedure Laterality Date  . Abdominal hysterectomy      2003  . Exploratory laparotomy      complex pelvic mass 2011  . Bilateral salpingoophorectomy      01/2010  . Dilation and curettage of uterus     Family History  Problem Relation Age of Onset  . Hypertension Mother   . Hypertension Sister   . Diabetes Father    History  Substance Use Topics  . Smoking status: Current Every Day Smoker -- 0.50 packs/day for 16 years    Types: Cigarettes  . Smokeless tobacco: Never Used     Comment: Wants to quit.  Cutting back - smokes mainly at night.  . Alcohol Use: No     Comment: Occasional drinker   OB History   Grav Para Term Preterm Abortions TAB SAB Ect Mult Living   5 0 0 0 4 0 1 0 0 0      Review of Systems  Constitutional: Negative for  fever and chills.  Eyes: Negative for visual disturbance.  Respiratory: Negative for cough and shortness of breath.   Cardiovascular: Negative for chest pain, palpitations and leg swelling.  Gastrointestinal: Negative for nausea, vomiting and abdominal pain.  Endocrine: Negative for polydipsia and polyuria.  Genitourinary: Negative for dysuria, urgency and frequency.  Musculoskeletal: Positive for back pain (see history of present illness) and myalgias. Negative for arthralgias.  Skin: Negative for rash.  Neurological: Negative for dizziness, weakness and light-headedness.    Allergies  Review of patient's allergies indicates no known allergies.  Home Medications   Current Outpatient Rx  Name  Route  Sig  Dispense  Refill  . atenolol (TENORMIN) 50 MG tablet   Oral   Take 1 tablet (50 mg total) by mouth daily.   30 tablet   3   . hydrochlorothiazide (HYDRODIURIL) 12.5 MG tablet   Oral   Take 1 tablet (12.5 mg total) by mouth daily.   90 tablet   3   . HYDROcodone-acetaminophen (NORCO) 7.5-325 MG per tablet   Oral   Take 1 tablet by mouth every 8 (eight) hours as needed for severe pain.   10 tablet   0   . HYDROcodone-acetaminophen (NORCO/VICODIN) 5-325 MG per  tablet   Oral   Take 0.5 tablets by mouth every 6 (six) hours as needed for moderate pain.   10 tablet   0   . ibuprofen (ADVIL,MOTRIN) 800 MG tablet   Oral   Take 1 tablet (800 mg total) by mouth 3 (three) times daily.   21 tablet   0   . ibuprofen (ADVIL,MOTRIN) 800 MG tablet   Oral   Take 1 tablet (800 mg total) by mouth every 8 (eight) hours as needed.   60 tablet   1   . lisinopril (PRINIVIL,ZESTRIL) 20 MG tablet   Oral   Take 1 tablet (20 mg total) by mouth daily.   90 tablet   3   . oxyCODONE-acetaminophen (PERCOCET/ROXICET) 5-325 MG per tablet   Oral   Take 1-2 tablets by mouth every 4 (four) hours as needed for pain.   15 tablet   0   . pantoprazole (PROTONIX) 20 MG tablet   Oral    Take 1 tablet (20 mg total) by mouth daily.   30 tablet   1    BP 137/91  Pulse 73  Temp(Src) 98.5 F (36.9 C) (Oral)  SpO2 94%  LMP 12/04/2009 Physical Exam  Nursing note and vitals reviewed. Constitutional: She is oriented to person, place, and time. Vital signs are normal. She appears well-developed and well-nourished. No distress.  HENT:  Head: Normocephalic and atraumatic.  Pulmonary/Chest: Effort normal. No respiratory distress.  Musculoskeletal:       Lumbar back: She exhibits tenderness and bony tenderness.       Back:  Neurological: She is alert and oriented to person, place, and time. She has normal strength and normal reflexes. She displays no atrophy. No cranial nerve deficit or sensory deficit. She exhibits normal muscle tone. She displays a negative Romberg sign. Coordination and gait normal.  Skin: Skin is warm and dry. No rash noted. She is not diaphoretic.  Psychiatric: She has a normal mood and affect. Judgment normal.    ED Course  Procedures (including critical care time) Labs Review Labs Reviewed - No data to display Imaging Review No results found.    MDM   1. Sciatica of right side    Given an injection of Toradol here, will discharge with ibuprofen and Norco. Followup with your primary care physician    Meds ordered this encounter  Medications  . ketorolac (TORADOL) injection 60 mg    Sig:   . HYDROcodone-acetaminophen (NORCO) 7.5-325 MG per tablet    Sig: Take 1 tablet by mouth every 8 (eight) hours as needed for severe pain.    Dispense:  10 tablet    Refill:  0    Order Specific Question:  Supervising Provider    Answer:  Linna Hoff [phone]  . ibuprofen (ADVIL,MOTRIN) 800 MG tablet    Sig: Take 1 tablet (800 mg total) by mouth every 8 (eight) hours as needed.    Dispense:  60 tablet    Refill:  1    Order Specific Question:  Supervising Provider    Answer:  Bradd Canary D [[id number]]       Graylon Good, PA-C 06/22/13 1046

## 2013-06-22 NOTE — ED Provider Notes (Signed)
Medical screening examination/treatment/procedure(s) were performed by non-physician practitioner and as supervising physician I was immediately available for consultation/collaboration.  Oneda Duffett, M.D.  Larah Kuntzman C Lorieann Argueta, MD 06/22/13 1927 

## 2013-06-22 NOTE — ED Notes (Addendum)
Patient states she has been having a lot of pain in her back and into her leg that makes it hard for her to walk. "i got that sciatica ! They told me i got to walk and loose weight , but I can't walk hurting this bad! " reportedly has an appointment to see MD on 11-19, and wants a shot of tramadol to hold her over till then , and she wants that fat operation so she can loose weight. I had to take 3 Vicodin so I could walk from my house to the bus to get here

## 2013-06-29 ENCOUNTER — Encounter: Payer: Self-pay | Admitting: Internal Medicine

## 2013-07-05 ENCOUNTER — Ambulatory Visit: Payer: Self-pay

## 2013-07-06 ENCOUNTER — Ambulatory Visit: Payer: Self-pay | Admitting: Internal Medicine

## 2013-07-06 ENCOUNTER — Telehealth: Payer: Self-pay | Admitting: *Deleted

## 2013-07-06 NOTE — Telephone Encounter (Signed)
Pt called stating a few weeks ago she got up during the night and noted fluid running out of nose. Fluid is clear and just wets her hands.  Then 3 days ago sat up and same thing happened, fluid ran out of nose.  Again today when pt got up more clear fluid ran out her nose.  She is asking if she needs to go to the ED for this. She has no other complaints,  No headache, fever, cough. Pt # [id number]  Please advise.

## 2013-07-06 NOTE — Telephone Encounter (Signed)
She has an appt 12/3 with Dr Dierdre Searles and I encourage her to keep that. She needs to go to ED if HA, fever, fluid doesn't stop, blood, dizzy, etc.   Can she quantify the amount? Does she have other allergy sxs?

## 2013-07-06 NOTE — Telephone Encounter (Signed)
Pt will keep appointment.  She dose not have allergies. The amount is small, wets hands. She will go to ED if any changes.

## 2013-07-13 ENCOUNTER — Ambulatory Visit: Payer: Self-pay

## 2013-07-13 ENCOUNTER — Ambulatory Visit: Payer: Self-pay | Admitting: Internal Medicine

## 2013-08-23 ENCOUNTER — Ambulatory Visit: Payer: Self-pay

## 2013-08-23 ENCOUNTER — Ambulatory Visit: Payer: Self-pay | Admitting: Internal Medicine

## 2013-08-23 ENCOUNTER — Encounter: Payer: Self-pay | Admitting: Internal Medicine

## 2013-08-26 ENCOUNTER — Ambulatory Visit: Payer: Self-pay

## 2013-08-26 ENCOUNTER — Other Ambulatory Visit: Payer: Self-pay | Admitting: *Deleted

## 2013-08-26 MED ORDER — IBUPROFEN 800 MG PO TABS: 800.0000 mg | ORAL_TABLET | Freq: Three times a day (TID) | ORAL | Status: DC | PRN

## 2013-08-26 NOTE — Addendum Note (Signed)
Addended by: Rebecca Eaton E on: 08/26/2013 02:13 PM   Modules accepted: Orders

## 2013-08-30 NOTE — Telephone Encounter (Signed)
Rx called in 

## 2013-10-05 ENCOUNTER — Other Ambulatory Visit: Payer: Self-pay | Admitting: *Deleted

## 2013-10-05 MED ORDER — ATENOLOL 50 MG PO TABS: 50.0000 mg | ORAL_TABLET | Freq: Every day | ORAL | Status: DC

## 2013-10-07 NOTE — Telephone Encounter (Signed)
Called to pharm 

## 2013-10-21 ENCOUNTER — Ambulatory Visit: Payer: Self-pay

## 2013-10-25 ENCOUNTER — Ambulatory Visit: Payer: Self-pay

## 2014-01-23 ENCOUNTER — Other Ambulatory Visit: Payer: Self-pay | Admitting: *Deleted

## 2014-01-25 MED ORDER — IBUPROFEN 800 MG PO TABS: 800.0000 mg | ORAL_TABLET | Freq: Three times a day (TID) | ORAL | Status: DC

## 2014-01-25 NOTE — Telephone Encounter (Signed)
Rx called in to pharmacy. 

## 2014-01-26 ENCOUNTER — Emergency Department (HOSPITAL_COMMUNITY)
Admission: EM | Admit: 2014-01-26 | Discharge: 2014-01-26 | Disposition: A | Discharge status: Home / Self Care | Payer: No Typology Code available for payment source | Attending: Emergency Medicine | Admitting: Emergency Medicine

## 2014-01-26 ENCOUNTER — Encounter (HOSPITAL_COMMUNITY): Payer: Self-pay | Admitting: Emergency Medicine

## 2014-01-26 DIAGNOSIS — K089 Disorder of teeth and supporting structures, unspecified: Secondary | ICD-10-CM | POA: Insufficient documentation

## 2014-01-26 DIAGNOSIS — F17200 Nicotine dependence, unspecified, uncomplicated: Secondary | ICD-10-CM | POA: Insufficient documentation

## 2014-01-26 DIAGNOSIS — Z8742 Personal history of other diseases of the female genital tract: Secondary | ICD-10-CM | POA: Insufficient documentation

## 2014-01-26 DIAGNOSIS — Z8659 Personal history of other mental and behavioral disorders: Secondary | ICD-10-CM | POA: Insufficient documentation

## 2014-01-26 DIAGNOSIS — Z791 Long term (current) use of non-steroidal anti-inflammatories (NSAID): Secondary | ICD-10-CM | POA: Insufficient documentation

## 2014-01-26 DIAGNOSIS — Z79899 Other long term (current) drug therapy: Secondary | ICD-10-CM | POA: Insufficient documentation

## 2014-01-26 DIAGNOSIS — I10 Essential (primary) hypertension: Secondary | ICD-10-CM | POA: Insufficient documentation

## 2014-01-26 DIAGNOSIS — K029 Dental caries, unspecified: Secondary | ICD-10-CM

## 2014-01-26 MED ORDER — AMOXICILLIN 500 MG PO CAPS: 500.0000 mg | ORAL_CAPSULE | Freq: Three times a day (TID) | ORAL | Status: DC

## 2014-01-26 MED ORDER — TRAMADOL HCL 50 MG PO TABS: 50.0000 mg | ORAL_TABLET | Freq: Four times a day (QID) | ORAL | Status: DC | PRN

## 2014-01-26 NOTE — ED Provider Notes (Signed)
Medical screening examination/treatment/procedure(s) were performed by non-physician practitioner and as supervising physician I was immediately available for consultation/collaboration.   EKG Interpretation None       Varney Biles, MD 01/26/14 2339

## 2014-01-26 NOTE — Discharge Instructions (Signed)
Please followup with the dentist tomorrow for continued evaluation and treatment.    Dental Pain A tooth ache may be caused by cavities (tooth decay). Cavities expose the nerve of the tooth to air and hot or cold temperatures. It may come from an infection or abscess (also called a boil or furuncle) around your tooth. It is also often caused by dental caries (tooth decay). This causes the pain you are having. DIAGNOSIS  Your caregiver can diagnose this problem by exam. TREATMENT   If caused by an infection, it may be treated with medications which kill germs (antibiotics) and pain medications as prescribed by your caregiver. Take medications as directed.  Only take over-the-counter or prescription medicines for pain, discomfort, or fever as directed by your caregiver.  Whether the tooth ache today is caused by infection or dental disease, you should see your dentist as soon as possible for further care. SEEK MEDICAL CARE IF: The exam and treatment you received today has been provided on an emergency basis only. This is not a substitute for complete medical or dental care. If your problem worsens or new problems (symptoms) appear, and you are unable to meet with your dentist, call or return to this location. SEEK IMMEDIATE MEDICAL CARE IF:   You have a fever.  You develop redness and swelling of your face, jaw, or neck.  You are unable to open your mouth.  You have severe pain uncontrolled by pain medicine. MAKE SURE YOU:   Understand these instructions.  Will watch your condition.  Will get help right away if you are not doing well or get worse. Document Released: 07/28/2005 Document Revised: 10/20/2011 Document Reviewed: 03/15/2008 [facility] Patient Information 2015 [place], [place]. This information is not intended to replace advice given to you by your health care provider. Make sure you discuss any questions you have with your health care provider.    Dental Caries Dental  caries is tooth decay. This decay can cause a hole in teeth (cavity) that can get bigger and deeper over time. HOME CARE  Brush and floss your teeth. Do this at least two times a day.  Use a fluoride toothpaste.  Use a mouth rinse if told by your dentist or doctor.  Eat less sugary and starchy foods. Drink less sugary drinks.  Avoid snacking often on sugary and starchy foods. Avoid sipping often on sugary drinks.  Keep regular checkups and cleanings with your dentist.  Use fluoride supplements if told by your dentist or doctor.  Allow fluoride to be applied to teeth if told by your dentist or doctor. MAKE SURE YOU:  Understand these instructions.  Will watch your condition.  Will get help right away if you are not doing well or get worse. Document Released: 05/06/2008 Document Revised: 03/30/2013 Document Reviewed: 07/30/2012 [facility] Patient Information 2015 [place], [place]. This information is not intended to replace advice given to you by your health care provider. Make sure you discuss any questions you have with your health care provider.

## 2014-01-26 NOTE — ED Notes (Signed)
Pt c/o right side dental pain that has gotten worse today.

## 2014-01-26 NOTE — ED Provider Notes (Signed)
CSN: [id number]     Arrival date & time 01/26/14  1812 History  This chart was scribed for a non-physician practitioner, Hazel Sams PA-C, working with Varney Biles, MD by Martinique Peace, ED Scribe. The patient was seen in [facility]. The patient's care was started at 8:22 PM.     Chief Complaint  Patient presents with  . Dental Pain      HPI HPI Comments: TWYLAH BENNETTS is a 53 y.o. female with history of bad dental hygiene presents to the Emergency Department complaining of severe radiating dental pain onset around 2:00 PM today. She reports the pain radiates to her throat and eyes. She states she has been taking Advil to help alleviate pain over the course of the past couple days which she states has been effective but is no longer helping. She denies fever. Her PCP is [employer].     Past Medical History  Diagnosis Date  . Fibroids     now s/p hysterectomy  . Left ventricular hypertrophy   . Polysubstance abuse     IV drug [place], cocaine on occassion, smoking and alcoholism, has been going to AA, not completely sober  . Hypertension   . bipolar   . Depression    Past Surgical History  Procedure Laterality Date  . Abdominal hysterectomy      2003  . Exploratory laparotomy      complex pelvic mass 2011  . Bilateral salpingoophorectomy      01/2010  . Dilation and curettage of uterus     Family History  Problem Relation Age of Onset  . Hypertension Mother   . Hypertension Sister   . Diabetes Father    History  Substance Use Topics  . Smoking status: Current Every Day Smoker -- 0.50 packs/day for 16 years    Types: Cigarettes  . Smokeless tobacco: Never Used     Comment: Wants to quit.  Cutting back - smokes mainly at night.  . Alcohol Use: No     Comment: Occasional drinker   OB History   Grav Para Term Preterm Abortions TAB SAB Ect Mult Living   5 0 0 0 4 0 1 0 0 0      Review of Systems  Constitutional: Negative for fever, chills and diaphoresis.   HENT: Negative for facial swelling.   All other systems reviewed and are negative.     Allergies  Review of patient's allergies indicates no known allergies.  Home Medications   Prior to Admission medications   Medication Sig Start Date End Date Taking? Authorizing Provider  atenolol (TENORMIN) 50 MG tablet Take 1 tablet (50 mg total) by mouth daily. 10/05/13   Dominic Pea, DO  hydrochlorothiazide (HYDRODIURIL) 12.5 MG tablet Take 1 tablet (12.5 mg total) by mouth daily. 06/03/13   Sid Falcon, MD  HYDROcodone-acetaminophen ([place]) 7.5-325 MG per tablet Take 1 tablet by mouth every 8 (eight) hours as needed for severe pain. 06/22/13   Liam Graham, PA-C  HYDROcodone-acetaminophen (NORCO/VICODIN) 5-325 MG per tablet Take 0.5 tablets by mouth every 6 (six) hours as needed for moderate pain. 06/15/13   Madilyn Fireman, MD  ibuprofen (ADVIL,MOTRIN) 800 MG tablet Take 1 tablet (800 mg total) by mouth every 8 (eight) hours as needed. 08/26/13   Rebecca Eaton, MD  ibuprofen (ADVIL,MOTRIN) 800 MG tablet Take 1 tablet (800 mg total) by mouth 3 (three) times daily.    Rebecca Eaton, MD  lisinopril (PRINIVIL,ZESTRIL) 20 MG tablet Take 1 tablet (20  mg total) by mouth daily. 06/03/13   Sid Falcon, MD  oxyCODONE-acetaminophen (PERCOCET/ROXICET) 5-325 MG per tablet Take 1-2 tablets by mouth every 4 (four) hours as needed for pain. 05/06/13   Farris Has, PA-C  pantoprazole (PROTONIX) 20 MG tablet Take 1 tablet (20 mg total) by mouth daily. 05/20/13 05/20/14  Hester Mates, MD   Triage Vitals: BP 171/98  Pulse 64  Temp(Src) 98.2 F (36.8 C) (Oral)  Resp 22  SpO2 90%  LMP 12/04/2009 Physical Exam  Nursing note and vitals reviewed. Constitutional: She is oriented to person, place, and time. She appears well-developed and well-nourished. No distress.  HENT:  Head: Normocephalic.  Mouth/Throat: Oropharynx is clear and moist.  Poor dentition throughout with multiple dental caries  and significant decay of the teeth. The teeth are decayed all the way to the gumline. There is tenderness around the right upper canine and first premolar area. No significant swelling of the gums or signs of obvious dental abscess. No facial asymmetry.  Neck: Normal range of motion. Neck supple.  Cardiovascular: Normal rate and regular rhythm.   Pulmonary/Chest: Effort normal and breath sounds normal. No respiratory distress. She has no wheezes. She has no rales.  Abdominal: Soft.  Musculoskeletal: Normal range of motion.  Lymphadenopathy:    She has no cervical adenopathy.  Neurological: She is alert and oriented to person, place, and time.  Skin: Skin is warm and dry. No rash noted.  Psychiatric: She has a normal mood and affect. Her behavior is normal.    ED Course  Procedures     COORDINATION OF CARE: 8:27 PM- Treatment plan was discussed with patient who verbalizes understanding and agrees.    Dental Block Performed by: Martie Lee Authorized by: Martie Lee Consent: Verbal consent obtained. Risks and benefits: risks, benefits and alternatives were discussed Consent given by: patient Patient identity confirmed: provided demographic data  Location: Right upper canine   Local anesthetic: Bupivacaine 0.5% with epinephrine  Anesthetic total: 1.8 ml  Irrigation method: syringe  Patient tolerance: Patient tolerated the procedure well with no immediate complications. Pain improved.   MDM   Final diagnoses:  Pain due to dental caries    I personally performed the services described in this documentation, which was scribed in my presence. The recorded information has been reviewed and is accurate.    Martie Lee, PA-C 01/26/14 2200

## 2014-04-12 ENCOUNTER — Ambulatory Visit
Admission: RE | Admit: 2014-04-12 | Discharge: 2014-04-12 | Disposition: A | Discharge status: Home / Self Care | Payer: No Typology Code available for payment source | Source: Ambulatory Visit

## 2014-04-12 ENCOUNTER — Other Ambulatory Visit: Payer: Self-pay

## 2014-04-12 DIAGNOSIS — Z1231 Encounter for screening mammogram for malignant neoplasm of breast: Secondary | ICD-10-CM

## 2014-06-12 ENCOUNTER — Encounter (HOSPITAL_COMMUNITY): Payer: Self-pay | Admitting: Emergency Medicine

## 2014-07-14 ENCOUNTER — Encounter: Payer: Self-pay | Admitting: Internal Medicine

## 2014-07-17 ENCOUNTER — Ambulatory Visit: Payer: Self-pay

## 2014-07-17 ENCOUNTER — Encounter: Payer: Self-pay | Admitting: Internal Medicine

## 2014-07-17 ENCOUNTER — Ambulatory Visit (INDEPENDENT_AMBULATORY_CARE_PROVIDER_SITE_OTHER): Payer: Self-pay | Admitting: Internal Medicine

## 2014-07-17 VITALS — BP 131/75 | HR 87 | Temp 98.5°F | Ht 64.0 in | Wt 236.1 lb

## 2014-07-17 DIAGNOSIS — D1739 Benign lipomatous neoplasm of skin and subcutaneous tissue of other sites: Secondary | ICD-10-CM

## 2014-07-17 DIAGNOSIS — L988 Other specified disorders of the skin and subcutaneous tissue: Secondary | ICD-10-CM

## 2014-07-17 DIAGNOSIS — Z0000 Encounter for general adult medical examination without abnormal findings: Secondary | ICD-10-CM | POA: Insufficient documentation

## 2014-07-17 DIAGNOSIS — Z299 Encounter for prophylactic measures, unspecified: Secondary | ICD-10-CM

## 2014-07-17 DIAGNOSIS — Z418 Encounter for other procedures for purposes other than remedying health state: Secondary | ICD-10-CM

## 2014-07-17 DIAGNOSIS — F419 Anxiety disorder, unspecified: Secondary | ICD-10-CM

## 2014-07-17 DIAGNOSIS — L989 Disorder of the skin and subcutaneous tissue, unspecified: Secondary | ICD-10-CM | POA: Insufficient documentation

## 2014-07-17 DIAGNOSIS — D1720 Benign lipomatous neoplasm of skin and subcutaneous tissue of unspecified limb: Secondary | ICD-10-CM

## 2014-07-17 NOTE — Assessment & Plan Note (Signed)
Pt with lipoma of left anterior shoulder. She states that it is itching but denies pain. She states that she wants it removed. On exam, the lipoma is smooth and round. Overlying skin is intact and normal appearing. Discussed with the patient that she can be referred to General Surgery for resection but that she would have to pay out of pocket without insurance. She will call once she gets the [place] to schedule an appointment in the clinic to be seen

## 2014-07-17 NOTE — Patient Instructions (Addendum)
Call us to schedule an appointment after you get your [facility] and we will set up the follow up appointments.  General Instructions:   Please bring your medicines with you each time you come to clinic.  Medicines may include prescription medications, over-the-counter medications, herbal remedies, eye drops, vitamins, or other pills.   Progress Toward Treatment Goals:  Treatment Goal 05/20/2013  Blood pressure at goal    Self Care Goals & Plans:  Self Care Goal 07/17/2014  Manage my medications take my medicines as prescribed; bring my medications to every visit; refill my medications on time; follow the sick day instructions if I am sick  Eat healthy foods eat more vegetables; eat fruit for snacks and desserts; eat smaller portions  Be physically active find an activity I enjoy  Stop smoking -    No flowsheet data found.   Care Management & Community Referrals:  Referral 05/20/2013  Referrals made for care management support none needed  Referrals made to community resources -

## 2014-07-17 NOTE — Assessment & Plan Note (Addendum)
Pt 1x2-3cm scaly lesion on left lateral scalp just superior to the ear. No erythema, warmth, or drainage. She states that it has been present for 20 years, but would like to have it removed. 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 She does not have insurance, but is getting the [employer] in the next week or so. Will refer her to Dermatology at that time for further evaluation.

## 2014-07-17 NOTE — Progress Notes (Signed)
[name], female   DOB: 01-21-1961, 53 y.o.   MRN: [id number]  Subjective:   [name] female   DOB: 02-02-1961 53 y.o.   MRN: [id number]  HPI: [name] is a 53 y.o. F w/ PMH HTN, depression, and bipolar disorder who presents for a number of issues including anxiety.  Please refer to problem list.   Past Medical History  Diagnosis Date  . Fibroids     now s/p hysterectomy  . Left ventricular hypertrophy   . Polysubstance abuse     IV drug [place], cocaine on occassion, smoking and alcoholism, has been going to AA, not completely sober  . Hypertension   . bipolar   . Depression    Current Outpatient Prescriptions  Medication Sig Dispense Refill  . amoxicillin (AMOXIL) 500 MG capsule Take 1 capsule (500 mg total) by mouth 3 (three) times daily. (Patient not taking: Reported on 07/17/2014) 30 capsule 0  . atenolol (TENORMIN) 50 MG tablet Take 1 tablet (50 mg total) by mouth daily. (Patient not taking: Reported on 07/17/2014) 90 tablet 2  . hydrochlorothiazide (HYDRODIURIL) 12.5 MG tablet Take 1 tablet (12.5 mg total) by mouth daily. (Patient not taking: Reported on 07/17/2014) 90 tablet 3  . HYDROcodone-acetaminophen (NORCO) 7.5-325 MG per tablet Take 1 tablet by mouth every 8 (eight) hours as needed for severe pain. (Patient not taking: Reported on 07/17/2014) 10 tablet 0  . HYDROcodone-acetaminophen (NORCO/VICODIN) 5-325 MG per tablet Take 0.5 tablets by mouth every 6 (six) hours as needed for moderate pain. (Patient not taking: Reported on 07/17/2014) 10 tablet 0  . ibuprofen (ADVIL,MOTRIN) 800 MG tablet Take 1 tablet (800 mg total) by mouth every 8 (eight) hours as needed. (Patient not taking: Reported on 07/17/2014) 60 tablet 1  . ibuprofen (ADVIL,MOTRIN) 800 MG tablet Take 1 tablet (800 mg total) by mouth 3 (three) times daily. (Patient not taking: Reported on 07/17/2014) 21 tablet 0  . lisinopril (PRINIVIL,ZESTRIL) 20 MG tablet Take 1 tablet (20  mg total) by mouth daily. (Patient not taking: Reported on 07/17/2014) 90 tablet 3  . oxyCODONE-acetaminophen (PERCOCET/ROXICET) 5-325 MG per tablet Take 1-2 tablets by mouth every 4 (four) hours as needed for pain. (Patient not taking: Reported on 07/17/2014) 15 tablet 0  . pantoprazole (PROTONIX) 20 MG tablet Take 1 tablet (20 mg total) by mouth daily. 30 tablet 1  . traMADol (ULTRAM) 50 MG tablet Take 1 tablet (50 mg total) by mouth every 6 (six) hours as needed. (Patient not taking: Reported on 07/17/2014) 15 tablet 0   No current facility-administered medications for this visit.   Family History  Problem Relation Age of Onset  . Hypertension Mother   . Hypertension Sister   . Diabetes Father    History   Social History  . Marital Status: Single    Spouse Name: N/A    Number of Children: N/A  . Years of Education: N/A   Social History Main Topics  . Smoking status: Current Every Day Smoker -- 0.50 packs/day for 16 years    Types: Cigarettes  . Smokeless tobacco: Never Used     Comment: Wants to quit.  Cutting back - smokes mainly at night.  . Alcohol Use: No     Comment: Occasional drinker  . Drug Use: Yes    Special: Cocaine  . Sexual Activity: Yes    Birth Control/ Protection: Surgical   Other Topics Concern  . None   Social History  Narrative   Financial assistance approved for 100% discount at [facility] and has [facility] card per Bonna Gains   07/19/2010         Review of Systems: A 12 point ROS was performed; pertinent positives and negatives are noted below:  Denies chest pain, SOB, abdominal pain, back pain, or sciatica. Endorses itching at lipoma on L shoulder only, +scalp lesion, +Pain in anterior R thigh with ambulation that resolves with ambulation.  Objective:  Physical Exam: Filed Vitals:   07/17/14 1432  BP: 131/75  Pulse: 87  Temp: 98.5 F (36.9 C)  TempSrc: Oral  Height: 5\' 4"  (1.626 m)  Weight: 236 lb 1.6 oz (107.094 kg)  SpO2: 97%   Constitutional:  Vital signs reviewed.  Patient is a well-developed and well-nourished female in no acute distress and cooperative with exam. Alert and oriented x3.  Head: Normocephalic and atraumatic Eyes: PERRL, EOMI Cardiovascular: RRR, no MRG Pulmonary/Chest: Normal respiratory effort, CTAB, no wheezes, rales, or rhonchi Abdominal: Soft. Non-tender, non-distended, bowel sounds are normal, no guarding present.  Musculoskeletal: Moves all 4 extremities Neurological: A&O x3, cranial nerve II-XII are grossly intact, no focal motor deficit.  Skin: 1x2-3cm scaly lesion on left lateral scalp just superior to the ear.  Psychiatric: Anxious and irritated throughout exam. Speech pressured at time.   Assessment & Plan:   Please refer to Problem List based Assessment and Plan  Pt unable to get her medications since Sept when her [employer] expired.   She was being followed at [facility] but quit seeing them over the past few months for unknown reasons.

## 2014-07-17 NOTE — Assessment & Plan Note (Addendum)
Flu vaccine given today. Pt strongly declines colonoscopy even after risks were discussed. She is due for a pap smear, will defer to PCP.

## 2014-07-17 NOTE — Assessment & Plan Note (Signed)
Pt c/o anxiety and increased irritability. She used to be on Prozac, which did not help, per pt. She was following with [facility] but has not been seen by them in a number of months, pt unwilling to elaborate why. Of note, she was also on Lithium from them as well. Today she is requesting an as needed medication for her anxiety, which I told her she would need to get from a mental health provider. An SSRI such as sertraline, citalopram, or  would be 1st line therapy for GAD; however with her h/o bipolar disorder, I am concerned about starting an SSRI and causing mania.  - Recommend that she follow up with mental health provider for therapy for anxiety and for her bipolar disorder.

## 2014-07-22 NOTE — Progress Notes (Signed)
Case discussed with Dr. Glenn soon after the resident saw the patient.  We reviewed the resident's history and exam and pertinent patient test results.  I agree with the assessment, diagnosis, and plan of care documented in the resident's note. 

## 2014-07-22 NOTE — Addendum Note (Signed)
Addended by: Oval Linsey D on: 07/22/2014 09:09 AM   Modules accepted: Orders, Medications

## 2014-07-24 ENCOUNTER — Ambulatory Visit: Payer: Self-pay

## 2014-07-27 ENCOUNTER — Other Ambulatory Visit: Payer: Self-pay | Admitting: *Deleted

## 2014-07-27 NOTE — Telephone Encounter (Signed)
Pt also ask for refill on Prozac

## 2014-07-28 MED ORDER — HYDROCHLOROTHIAZIDE 12.5 MG PO TABS: 12.5000 mg | ORAL_TABLET | Freq: Every day | ORAL | Status: DC

## 2014-07-28 MED ORDER — ATENOLOL 50 MG PO TABS: 50.0000 mg | ORAL_TABLET | Freq: Every day | ORAL | Status: DC

## 2014-07-28 MED ORDER — LISINOPRIL 20 MG PO TABS: 20.0000 mg | ORAL_TABLET | Freq: Every day | ORAL | Status: DC

## 2014-07-28 NOTE — Telephone Encounter (Signed)
As this patient was seen by Dr. Eulas Post on 12/7 and deemed necessary for their treatment as documented in the EHR, I will refill this prescription for her BP meds. As she needs follow-up for her skin lesion, I will only fill for a 3-month supply until she can see me in February.

## 2014-07-31 NOTE — Telephone Encounter (Signed)
Meds called to [facility]

## 2014-08-16 ENCOUNTER — Ambulatory Visit: Payer: Self-pay | Admitting: Internal Medicine

## 2014-08-16 ENCOUNTER — Encounter: Payer: Self-pay | Admitting: Internal Medicine

## 2014-09-07 ENCOUNTER — Encounter (HOSPITAL_COMMUNITY): Payer: Self-pay | Admitting: Emergency Medicine

## 2014-09-07 ENCOUNTER — Emergency Department (HOSPITAL_COMMUNITY)
Admission: EM | Admit: 2014-09-07 | Discharge: 2014-09-07 | Disposition: A | Discharge status: Home / Self Care | Payer: Self-pay | Attending: Emergency Medicine | Admitting: Emergency Medicine

## 2014-09-07 DIAGNOSIS — Z8742 Personal history of other diseases of the female genital tract: Secondary | ICD-10-CM | POA: Insufficient documentation

## 2014-09-07 DIAGNOSIS — K029 Dental caries, unspecified: Secondary | ICD-10-CM | POA: Insufficient documentation

## 2014-09-07 DIAGNOSIS — Z720 Tobacco use: Secondary | ICD-10-CM | POA: Insufficient documentation

## 2014-09-07 DIAGNOSIS — F319 Bipolar disorder, unspecified: Secondary | ICD-10-CM | POA: Insufficient documentation

## 2014-09-07 DIAGNOSIS — Z79899 Other long term (current) drug therapy: Secondary | ICD-10-CM | POA: Insufficient documentation

## 2014-09-07 DIAGNOSIS — I10 Essential (primary) hypertension: Secondary | ICD-10-CM | POA: Insufficient documentation

## 2014-09-07 MED ORDER — IBUPROFEN 800 MG PO TABS: 800.0000 mg | ORAL_TABLET | Freq: Three times a day (TID) | ORAL | Status: DC

## 2014-09-07 MED ORDER — BUPIVACAINE-EPINEPHRINE (PF) 0.5% -1:200000 IJ SOLN
1.8000 mL | Freq: Once | INTRAMUSCULAR | Status: AC
  Administered 2014-09-07: 1.8 mL
  Filled 2014-09-07: qty 1.8

## 2014-09-07 MED ORDER — BUPIVACAINE HCL 0.5 % IJ SOLN
5.0000 mL | Freq: Once | INTRAMUSCULAR | Status: DC
Start: 2014-09-07 — End: 2014-09-07
  Filled 2014-09-07: qty 5

## 2014-09-07 MED ORDER — PENICILLIN V POTASSIUM 500 MG PO TABS: 500.0000 mg | ORAL_TABLET | Freq: Three times a day (TID) | ORAL | Status: DC

## 2014-09-07 NOTE — Discharge Instructions (Signed)

## 2014-09-07 NOTE — ED Notes (Signed)
Pt reports severe, stabbing pain on r/side of face, radiating to forehead and r/ear,  x 24 hours. Pt has hx dental pain over one year. Pt stated that she will be seen at [facility] next week.

## 2014-09-07 NOTE — ED Provider Notes (Signed)
CSN: [id number]     Arrival date & time 09/07/14  1147 History  This chart was scribed for non-physician practitioner Domenic Moras, working with Debby Freiberg, MD by Donato Schultz, ED Scribe. This patient was seen in room [place] and the patient's care was started at 12:03 PM.    Chief Complaint  Patient presents with  . Dental Pain    r/side facial pain    Patient is a 54 y.o. female presenting with tooth pain. The history is provided by the patient. No language interpreter was used.  Dental Pain  HPI Comments: Jennifer [name] is a 54 y.o. female who presents to the Emergency Department complaining of constant, severe, stabbing upper right sided dental pain radiating throughout the right side of her face and neck that started yesterday while she was laying down at home.  Cold air aggravates her symptoms.  She has been taking 800mg   Ibuprofen with no relief to her symptoms.  She has experienced similar symptoms in the past.  She recently started being seen by a dentist at [facility].  She has a dental appointment this week for an unrelated problem.  Past Medical History  Diagnosis Date  . Fibroids     now s/p hysterectomy  . Left ventricular hypertrophy   . Polysubstance abuse     IV drug [place], cocaine on occassion, smoking and alcoholism, has been going to AA, not completely sober  . Hypertension   . bipolar   . Depression    Past Surgical History  Procedure Laterality Date  . Abdominal hysterectomy      2003  . Exploratory laparotomy      complex pelvic mass 2011  . Bilateral salpingoophorectomy      01/2010  . Dilation and curettage of uterus     Family History  Problem Relation Age of Onset  . Hypertension Mother   . Hypertension Sister   . Diabetes Father    History  Substance Use Topics  . Smoking status: Current Every Day Smoker -- 0.50 packs/day for 16 years    Types: Cigarettes  . Smokeless tobacco: Never Used     Comment: Wants to quit.  Cutting  back - smokes mainly at night.  . Alcohol Use: No     Comment: Occasional drinker   OB History    Gravida Para Term Preterm AB TAB SAB Ectopic Multiple Living   5 0 0 0 4 0 1 0 0 0      Review of Systems  HENT: Positive for dental problem.   All other systems reviewed and are negative.     Allergies  Review of patient's allergies indicates no known allergies.  Home Medications   Prior to Admission medications   Medication Sig Start Date End Date Taking? Authorizing Provider  atenolol (TENORMIN) 50 MG tablet Take 1 tablet (50 mg total) by mouth daily. 07/28/14   Charlott Rakes, MD  hydrochlorothiazide (HYDRODIURIL) 12.5 MG tablet Take 1 tablet (12.5 mg total) by mouth daily. 07/28/14   Charlott Rakes, MD  ibuprofen (ADVIL,MOTRIN) 800 MG tablet Take 1 tablet (800 mg total) by mouth every 8 (eight) hours as needed. Patient not taking: Reported on 07/17/2014 08/26/13   Dixon Boos, MD  lisinopril (PRINIVIL,ZESTRIL) 20 MG tablet Take 1 tablet (20 mg total) by mouth daily. 07/28/14   Charlott Rakes, MD  pantoprazole (PROTONIX) 20 MG tablet Take 1 tablet (20 mg total) by mouth daily. 05/20/13 05/20/14  Hester Mates, MD  traMADol (  ULTRAM) 50 MG tablet Take 1 tablet (50 mg total) by mouth every 6 (six) hours as needed. Patient not taking: Reported on 07/17/2014 01/26/14   Martie Lee, PA-C   Triage Vitals: BP 141/78 mmHg  Pulse 60  Temp(Src) 98.3 F (36.8 C) (Oral)  Resp 20  SpO2 95%  LMP 12/04/2009 Physical Exam  Constitutional: She is oriented to person, place, and time. She appears well-developed and well-nourished.  HENT:  Head: Normocephalic and atraumatic.  Significant dental decay noted to tooth #4.  No tenderness to surrounding gingiva but no obvious abscess appreciated.  No overlying cellulitis.  No facial swelling.  Eyes: EOM are normal.  Neck: Normal range of motion.  Cardiovascular: Normal rate.   Pulmonary/Chest: Effort normal.  Musculoskeletal: Normal range of  motion.  Neurological: She is alert and oriented to person, place, and time.  Skin: Skin is warm and dry.  Psychiatric: She has a normal mood and affect. Her behavior is normal.  Nursing note and vitals reviewed.   ED Course  NERVE BLOCK Date/Time: 09/07/2014 12:39 PM Performed by: Domenic Moras Authorized by: Domenic Moras Consent: Verbal consent obtained. Risks and benefits: risks, benefits and alternatives were discussed Consent given by: patient Patient understanding: patient states understanding of the procedure being performed Patient consent: the patient's understanding of the procedure matches consent given Patient identity confirmed: verbally with patient and arm band Time out: Immediately prior to procedure a "time out" was called to verify the correct patient, procedure, equipment, support staff and site/side marked as required. Indications: pain relief Body area: face/mouth Nerve: infraorbital Laterality: right Patient sedated: no Preparation: Patient was prepped and draped in the usual sterile fashion. Patient position: sitting Needle gauge: 48 G Location technique: anatomical landmarks Local anesthetic: bupivacaine 0.5% with epinephrine Anesthetic total: 1.8 ml Outcome: pain improved Patient tolerance: Patient tolerated the procedure well with no immediate complications   (including critical care time)  DIAGNOSTIC STUDIES: Oxygen Saturation is 95% on room air, adequate by my interpretation.    COORDINATION OF CARE: 12:07 PM- Advised patient to continue using Ibuprofen and to follow-up with her dentist this week.  Will not prescribe pain medication at this time.  The patient agreed to the treatment plan.    Labs Review Labs Reviewed - No data to display  Imaging Review No results found.   EKG Interpretation None      MDM   Final diagnoses:  Pain due to dental caries    BP 141/78 mmHg  Pulse 60  Temp(Src) 98.3 F (36.8 C) (Oral)  Resp 20  SpO2  95%  LMP 12/04/2009  I personally performed the services described in this documentation, which was scribed in my presence. The recorded information has been reviewed and is accurate.    Domenic Moras, PA-C 09/07/14 1242  Debby Freiberg, MD 09/08/14 [phone]

## 2014-09-11 ENCOUNTER — Encounter: Payer: Self-pay | Admitting: Internal Medicine

## 2014-09-11 ENCOUNTER — Ambulatory Visit (INDEPENDENT_AMBULATORY_CARE_PROVIDER_SITE_OTHER): Payer: Self-pay | Admitting: Internal Medicine

## 2014-09-11 VITALS — BP 125/65 | HR 99 | Temp 98.0°F | Wt 228.4 lb

## 2014-09-11 DIAGNOSIS — T39395A Adverse effect of other nonsteroidal anti-inflammatory drugs [NSAID], initial encounter: Principal | ICD-10-CM

## 2014-09-11 DIAGNOSIS — F419 Anxiety disorder, unspecified: Secondary | ICD-10-CM

## 2014-09-11 DIAGNOSIS — K2960 Other gastritis without bleeding: Secondary | ICD-10-CM

## 2014-09-11 DIAGNOSIS — T3995XA Adverse effect of unspecified nonopioid analgesic, antipyretic and antirheumatic, initial encounter: Secondary | ICD-10-CM

## 2014-09-11 LAB — CBC
HCT: 42.7 % (ref 36.0–46.0)
Hemoglobin: 14 g/dL (ref 12.0–15.0)
MCH: 28.6 pg (ref 26.0–34.0)
MCHC: 32.8 g/dL (ref 30.0–36.0)
MCV: 87.3 fL (ref 78.0–100.0)
MPV: 10.4 fL (ref 8.6–12.4)
PLATELETS: 266 10*3/uL (ref 150–400)
RBC: 4.89 MIL/uL (ref 3.87–5.11)
RDW: 15.5 % (ref 11.5–15.5)
WBC: 7.1 10*3/uL (ref 4.0–10.5)

## 2014-09-11 MED ORDER — OMEPRAZOLE MAGNESIUM 20 MG PO TBEC: 20.0000 mg | DELAYED_RELEASE_TABLET | Freq: Every day | ORAL | Status: DC

## 2014-09-11 NOTE — Progress Notes (Signed)
Internal Medicine Clinic Attending  Case discussed with Dr. Patel at the time of the visit.  We reviewed the resident's history and exam and pertinent patient test results.  I agree with the assessment, diagnosis, and plan of care documented in the resident's note.  

## 2014-09-11 NOTE — Patient Instructions (Addendum)
General Instructions:   Please bring your medicines with you each time you come to clinic.  Medicines may include prescription medications, over-the-counter medications, herbal remedies, eye drops, vitamins, or other pills.  Please take note of the following changes to your medications: -START omeprazole 20mg  ONCE a day   If you have any questions, please call [phone].     Gastritis, Adult Gastritis is soreness and puffiness (inflammation) of the lining of the stomach. If you do not get help, gastritis can cause bleeding and sores (ulcers) in the stomach. HOME CARE   Only take medicine as told by your doctor.  If you were given antibiotic medicines, take them as told. Finish the medicines even if you start to feel better.  Drink enough fluids to keep your pee (urine) clear or pale yellow.  Avoid foods and drinks that make your problems worse. Foods you may want to avoid include:  Caffeine or alcohol.  Chocolate.  Mint.  Garlic and onions.  Spicy foods.  Citrus fruits, including oranges, lemons, or limes.  Food containing tomatoes, including sauce, chili, salsa, and pizza.  Fried and fatty foods.  Eat small meals throughout the day instead of large meals. GET HELP RIGHT AWAY IF:   You have black or dark red poop (stools).  You throw up (vomit) blood. It may look like coffee grounds.  You cannot keep fluids down.  Your belly (abdominal) pain gets worse.  You have a fever.  You do not feel better after 1 week.  You have any other questions or concerns. MAKE SURE YOU:   Understand these instructions.  Will watch your condition.  Will get help right away if you are not doing well or get worse. Document Released: 01/14/2008 Document Revised: 10/20/2011 Document Reviewed: 09/10/2011 [facility] Patient Information 2015 [place], [place]. This information is not intended to replace advice given to you by your health care provider. Make sure you discuss any  questions you have with your health care provider.

## 2014-09-15 NOTE — Assessment & Plan Note (Signed)
Overview -Feels that she hears voices "playing around in her head" -Reports that these voices are constantly nagging her -Does not feel that they're telling her to do things otherwise -Reports not being able to sleep for more than 30-40 minutes at a time due to her nervousness and anxiety -Used to see [facility] but no longer as she felt that they were not adequately treating her with trazodone and lithium -Would like to try Prozac or Xanax as she reports she received it from this clinic before  Assessment -Leery of starting a antipsychotic in an individual who was previously being seen by mental health -In the absence of additional information, there is a clear risk to precipitating a manic episode by starting an SSRI and an individual with a history of bipolar disorder as suggested by her prior self-report of lithium use  Plan -Defer until next appointment -Suspect she may have an nonpsychiatric process that may play be playing into her current anxiety

## 2014-09-15 NOTE — Progress Notes (Signed)
   Subjective:    Patient ID: [name], female    DOB: 1961/01/25, 54 y.o.   MRN: [id number]  HPI [name] is a 54 year old female with hypertension, depression who presents today for follow-up visit. Please see assessment & plan for documentation of each problem.  Review of Systems  Constitutional: Negative for fever.  Respiratory: Negative for shortness of breath.   Cardiovascular: Negative for chest pain.  Gastrointestinal: Positive for nausea and abdominal pain. Negative for diarrhea.  Neurological: Positive for dizziness and light-headedness.  Psychiatric/Behavioral: Positive for agitation. Negative for suicidal ideas and self-injury. The patient is nervous/anxious.        Objective:   Physical Exam Constitutional: She is oriented to person, place, and time. She appears well-developed and well-nourished. No distress.  HENT:  Head: Normocephalic and atraumatic.  Eyes: Conjunctivae are normal. Pupils are equal, round, and reactive to light.  Cardiovascular: Normal rate, regular rhythm and normal heart sounds.  Exam reveals no gallop and no friction rub.   No murmur heard. Pulmonary/Chest: Effort normal. No respiratory distress. She has no wheezes. She has no rales.  Abdominal: Mild tenderness to palpation over the epigastric area. No rigidity or guarding. No signs of an acute abdomen. Neurological: She is alert and oriented to person, place, and time. No cranial nerve deficit. Coordination normal.  Skin: Skin is warm and dry. She is not diaphoretic.  Psychiatric: Very restless and anxious. Unsure of her baseline.        Assessment & Plan:

## 2014-09-15 NOTE — Assessment & Plan Note (Addendum)
Overview -Reports going to the ED last week and was given ibuprofen 800 mg -Reports having taken 21 tablets over the last 3 days -Reports feeling nauseous though was able to tolerate eating pizza for lunch -Denies any vomiting, bloody bowel movements, though reports some minor dizziness  Assessment -Suspect gastritis likely secondary to excessive NSAID use is contributing to the anxiety that she initially reported beginning of the appointment  -Need to assess her hemodynamic status  Plan -Check orthostatic vital signs -Check FOBT and CBC  ADDENDUM 09/15/2014  7:40 PM:  -Orthostatic vital signs otherwise reassuring -Hemoglobin is otherwise stable at 14 from prior value -FOBT negative -Prescribe Prilosec 20 mg and advised her to take at least 30 minutes prior to her meal

## 2014-09-18 ENCOUNTER — Other Ambulatory Visit: Payer: Self-pay | Admitting: *Deleted

## 2014-09-18 MED ORDER — ATENOLOL 50 MG PO TABS: 50.0000 mg | ORAL_TABLET | Freq: Every day | ORAL | Status: DC

## 2014-09-18 MED ORDER — LISINOPRIL 20 MG PO TABS: 20.0000 mg | ORAL_TABLET | Freq: Every day | ORAL | Status: DC

## 2014-09-18 NOTE — Telephone Encounter (Signed)
As this patient was seen by me on 09/11/2014 and deemed necessary for their treatment as documented in the EHR, I will refill this prescription for atenolol and lisinopril. Her blood pressure appeared stable at that time so I will continue the current regimen.

## 2014-09-21 ENCOUNTER — Telehealth: Payer: Self-pay | Admitting: *Deleted

## 2014-09-21 MED ORDER — LISINOPRIL-HYDROCHLOROTHIAZIDE 20-12.5 MG PO TABS: 1.0000 | ORAL_TABLET | Freq: Every day | ORAL | Status: DC

## 2014-09-21 NOTE — Telephone Encounter (Signed)
That sounds reasonable. I have changed her medications accordingly and have additionally written for the combination pill. Please call this prescription in. Thank you.

## 2014-09-21 NOTE — Telephone Encounter (Signed)
[facility] called about pt's lisinopril, pt has been getting a lisinopril/ hctz mix [facility] even though we have given separate scripts, they have just discovered this, their question is this ok? The mix is 20mg  lisinopril and 12.5mg  hctz, could you go ahead and change the medlist to reflect this if you are ok? Thanks

## 2014-09-22 NOTE — Telephone Encounter (Signed)
Hd was informed, they spoke w/ pt

## 2014-09-25 ENCOUNTER — Encounter (HOSPITAL_COMMUNITY): Payer: Self-pay | Admitting: *Deleted

## 2014-09-25 ENCOUNTER — Ambulatory Visit: Payer: Self-pay | Admitting: Internal Medicine

## 2014-09-25 ENCOUNTER — Emergency Department (HOSPITAL_COMMUNITY)
Admission: EM | Admit: 2014-09-25 | Discharge: 2014-09-25 | Disposition: A | Discharge status: Home / Self Care | Payer: Self-pay | Attending: Emergency Medicine | Admitting: Emergency Medicine

## 2014-09-25 ENCOUNTER — Emergency Department (HOSPITAL_COMMUNITY): Payer: Self-pay

## 2014-09-25 DIAGNOSIS — Z8742 Personal history of other diseases of the female genital tract: Secondary | ICD-10-CM | POA: Insufficient documentation

## 2014-09-25 DIAGNOSIS — Z720 Tobacco use: Secondary | ICD-10-CM | POA: Insufficient documentation

## 2014-09-25 DIAGNOSIS — R1011 Right upper quadrant pain: Secondary | ICD-10-CM | POA: Insufficient documentation

## 2014-09-25 DIAGNOSIS — F312 Bipolar disorder, current episode manic severe with psychotic features: Secondary | ICD-10-CM | POA: Insufficient documentation

## 2014-09-25 DIAGNOSIS — I10 Essential (primary) hypertension: Secondary | ICD-10-CM | POA: Insufficient documentation

## 2014-09-25 DIAGNOSIS — F3111 Bipolar disorder, current episode manic without psychotic features, mild: Secondary | ICD-10-CM

## 2014-09-25 DIAGNOSIS — F419 Anxiety disorder, unspecified: Secondary | ICD-10-CM | POA: Insufficient documentation

## 2014-09-25 DIAGNOSIS — R112 Nausea with vomiting, unspecified: Secondary | ICD-10-CM | POA: Insufficient documentation

## 2014-09-25 DIAGNOSIS — Z79899 Other long term (current) drug therapy: Secondary | ICD-10-CM | POA: Insufficient documentation

## 2014-09-25 DIAGNOSIS — Z792 Long term (current) use of antibiotics: Secondary | ICD-10-CM | POA: Insufficient documentation

## 2014-09-25 LAB — URINALYSIS, ROUTINE W REFLEX MICROSCOPIC
Bilirubin Urine: NEGATIVE
GLUCOSE, UA: NEGATIVE mg/dL
KETONES UR: NEGATIVE mg/dL
Leukocytes, UA: NEGATIVE
Nitrite: NEGATIVE
PH: 5.5 (ref 5.0–8.0)
PROTEIN: NEGATIVE mg/dL
Specific Gravity, Urine: 1.015 (ref 1.005–1.030)
UROBILINOGEN UA: 0.2 mg/dL (ref 0.0–1.0)

## 2014-09-25 LAB — COMPREHENSIVE METABOLIC PANEL
ALT: 23 U/L (ref 0–35)
ANION GAP: 4 — AB (ref 5–15)
AST: 19 U/L (ref 0–37)
Albumin: 3.5 g/dL (ref 3.5–5.2)
Alkaline Phosphatase: 60 U/L (ref 39–117)
BUN: 16 mg/dL (ref 6–23)
CALCIUM: 9.6 mg/dL (ref 8.4–10.5)
CO2: 28 mmol/L (ref 19–32)
Chloride: 107 mmol/L (ref 96–112)
Creatinine, Ser: 0.94 mg/dL (ref 0.50–1.10)
GFR, EST AFRICAN AMERICAN: 80 mL/min — AB (ref >90)
GFR, EST NON AFRICAN AMERICAN: 69 mL/min — AB (ref >90)
Glucose, Bld: 94 mg/dL (ref 70–99)
Potassium: 4.1 mmol/L (ref 3.5–5.1)
Sodium: 139 mmol/L (ref 135–145)
Total Bilirubin: 0.4 mg/dL (ref 0.3–1.2)
Total Protein: 6.9 g/dL (ref 6.0–8.3)

## 2014-09-25 LAB — CBC WITH DIFFERENTIAL/PLATELET
BASOS ABS: 0 10*3/uL (ref 0.0–0.1)
Basophils Relative: 0 % (ref 0–1)
EOS ABS: 0.1 10*3/uL (ref 0.0–0.7)
Eosinophils Relative: 1 % (ref 0–5)
HCT: 43.4 % (ref 36.0–46.0)
HEMOGLOBIN: 14.3 g/dL (ref 12.0–15.0)
Lymphocytes Relative: 40 % (ref 12–46)
Lymphs Abs: 2.2 10*3/uL (ref 0.7–4.0)
MCH: 28.7 pg (ref 26.0–34.0)
MCHC: 32.9 g/dL (ref 30.0–36.0)
MCV: 87.1 fL (ref 78.0–100.0)
MONO ABS: 0.3 10*3/uL (ref 0.1–1.0)
Monocytes Relative: 6 % (ref 3–12)
NEUTROS ABS: 2.8 10*3/uL (ref 1.7–7.7)
Neutrophils Relative %: 53 % (ref 43–77)
PLATELETS: 246 10*3/uL (ref 150–400)
RBC: 4.98 MIL/uL (ref 3.87–5.11)
RDW: 15 % (ref 11.5–15.5)
WBC: 5.4 10*3/uL (ref 4.0–10.5)

## 2014-09-25 LAB — I-STAT VENOUS BLOOD GAS, ED
Acid-base deficit: 2 mmol/L (ref 0.0–2.0)
Bicarbonate: 23.7 mEq/L (ref 20.0–24.0)
O2 SAT: 72 %
PCO2 VEN: 43.7 mmHg — AB (ref 45.0–50.0)
TCO2: 25 mmol/L (ref 0–100)
pH, Ven: 7.342 — ABNORMAL HIGH (ref 7.250–7.300)
pO2, Ven: 40 mmHg (ref 30.0–45.0)

## 2014-09-25 LAB — URINE MICROSCOPIC-ADD ON

## 2014-09-25 LAB — SALICYLATE LEVEL

## 2014-09-25 LAB — LIPASE, BLOOD: LIPASE: 36 U/L (ref 11–59)

## 2014-09-25 MED ORDER — LORAZEPAM 2 MG/ML IJ SOLN
1.0000 mg | Freq: Once | INTRAMUSCULAR | Status: AC
  Administered 2014-09-25: 1 mg via INTRAVENOUS
  Filled 2014-09-25: qty 1

## 2014-09-25 MED ORDER — ALPRAZOLAM 0.5 MG PO TABS: 0.5000 mg | ORAL_TABLET | Freq: Three times a day (TID) | ORAL | Status: DC | PRN

## 2014-09-25 NOTE — ED Notes (Signed)
Patient returned from Ultrasound. 

## 2014-09-25 NOTE — Discharge Planning (Signed)
NCM consulted for medication assistance.  Pt has orange card; NCM reminded pt that she can not receive additional medication assistance.  Pt verbalized understanding.    Pt very agitated upon discharge demanding to be disconnected from monitor immediately. Agricultural consultant, charge CNA and NCM at bedside.  Pt ensured she had Xanax prescription in hand before getting dressed; tried to discard D/C instructions in trash prior to leaving room.  Charge CNA retrieved papers and handed them to pt on d/c from hospital.

## 2014-09-25 NOTE — ED Notes (Signed)
Patient angry at discharge with this RN due to delaying discharge instructions.  Patient at time of discharge was requesting assistance with medications.  MD Kathrynn Humble made aware and Case Management at bedside.  Security at bedside with the patient.  Charge RN at bedside having patient sign and remove IV.  Patient agitated and verbally abusive with this RN, screaming that "all I want to be is discharged".  This RN and MD made every effort to assist this patient prior to being discharged.

## 2014-09-25 NOTE — ED Notes (Signed)
Spoke with Tesha in the Main lab and Dark Green tube sent down.

## 2014-09-25 NOTE — ED Notes (Signed)
Pt states that she has had a toothache and has been taking ibuoprofen for that. Pt states that she has had RUQ pain and tenderness that is worse after eating. Pt states that she also has been out of her lithium and trazadone and requests a prescription for those until she can see family practice again

## 2014-09-25 NOTE — ED Provider Notes (Signed)
CSN: [id number]     Arrival date & time 09/25/14  1008 History   First MD Initiated Contact with Patient 09/25/14 1011     Chief Complaint  Patient presents with  . Abdominal Pain  . Medication Refill     (Consider location/radiation/quality/duration/timing/severity/associated sxs/prior Treatment) HPI Comments: Pt comes in with cc of anxiety and abdominal pain. Pt has hx of bipolar disorder and was taking lithium and trazodone. She stopped seeing her provider 6 months ago, and has noticed that over the past few weeks she has not been sleeping well, and getting very worries. She has hx of anxiety as well. She reports no grandiose thoughts, and is not engaging in any risky behavior (risky activities, current drug use, breaking laws etc). Pt is going to re certify herself and see her therapist again, would appreciate some meds for her anxiety.  Pt also having RUQ abd pain. Pain started about 1 week ago. Pain has been constant since 2 am and more severe, worse with po intake and she has had 2 episodes of emesis. Pt also reports taking ibuprofen "like m&ms" due to dental pain until few days ago.   ROS 10 Systems reviewed and are negative for acute change except as noted in the HPI.     Patient is a 54 y.o. female presenting with abdominal pain. The history is provided by the patient.  Abdominal Pain Associated symptoms: nausea and vomiting     Past Medical History  Diagnosis Date  . Fibroids     now s/p hysterectomy  . Left ventricular hypertrophy   . Polysubstance abuse     IV drug [place], cocaine on occassion, smoking and alcoholism, has been going to AA, not completely sober  . Hypertension   . bipolar   . Depression    Past Surgical History  Procedure Laterality Date  . Abdominal hysterectomy      2003  . Exploratory laparotomy      complex pelvic mass 2011  . Bilateral salpingoophorectomy      01/2010  . Dilation and curettage of uterus    . Tonsillectomy     Family  History  Problem Relation Age of Onset  . Hypertension Mother   . Hypertension Sister   . Diabetes Father    History  Substance Use Topics  . Smoking status: Current Every Day Smoker -- 0.50 packs/day for 16 years    Types: Cigarettes  . Smokeless tobacco: Never Used     Comment: Wants to quit.  Cutting back - smokes mainly at night.  . Alcohol Use: No     Comment: Occasional drinker   OB History    Gravida Para Term Preterm AB TAB SAB Ectopic Multiple Living   5 0 0 0 4 0 1 0 0 0      Review of Systems  Gastrointestinal: Positive for nausea, vomiting and abdominal pain.  Psychiatric/Behavioral: Positive for sleep disturbance. Negative for suicidal ideas, hallucinations, confusion and self-injury. The patient is nervous/anxious. The patient is not hyperactive.   All other systems reviewed and are negative.     Allergies  Review of patient's allergies indicates no known allergies.  Home Medications   Prior to Admission medications   Medication Sig Start Date End Date Taking? Authorizing Provider  ALPRAZolam Duanne Moron) 0.5 MG tablet Take 1 tablet (0.5 mg total) by mouth 3 (three) times daily as needed for anxiety. 09/25/14   Varney Biles, MD  atenolol (TENORMIN) 50 MG tablet Take 1 tablet (50 mg  total) by mouth daily. 09/18/14   Charlott Rakes, MD  ibuprofen (ADVIL,MOTRIN) 800 MG tablet Take 1 tablet (800 mg total) by mouth 3 (three) times daily. 09/07/14   Domenic Moras, PA-C  lisinopril-hydrochlorothiazide (PRINZIDE,ZESTORETIC) 20-12.5 MG per tablet Take 1 tablet by mouth daily. 09/21/14   Charlott Rakes, MD  omeprazole (PRILOSEC OTC) 20 MG tablet Take 1 tablet (20 mg total) by mouth daily. 09/11/14   Charlott Rakes, MD  penicillin v potassium (VEETID) 500 MG tablet Take 1 tablet (500 mg total) by mouth 3 (three) times daily. 09/07/14   Domenic Moras, PA-C  traMADol (ULTRAM) 50 MG tablet Take 1 tablet (50 mg total) by mouth every 6 (six) hours as needed. Patient not taking: Reported on 09/11/2014  01/26/14   Ruthell Rummage Dammen, PA-C   BP 119/76 mmHg  Pulse 54  Temp(Src) 98.7 F (37.1 C) (Oral)  Resp 13  SpO2 96%  LMP 12/04/2009 Physical Exam  Constitutional: She is oriented to person, place, and time. She appears well-developed and well-nourished.  HENT:  Head: Normocephalic and atraumatic.  Eyes: EOM are normal. Pupils are equal, round, and reactive to light.  Neck: Neck supple.  Cardiovascular: Normal rate, regular rhythm and normal heart sounds.   No murmur heard. Pulmonary/Chest: Effort normal. No respiratory distress.  Abdominal: Soft. She exhibits no distension. There is tenderness. There is no rebound and no guarding.  RUQ tenderness  Neurological: She is alert and oriented to person, place, and time.  Skin: Skin is warm and dry.  Psychiatric: Judgment and thought content normal.  Pt appeared a bit anxious, and maybe a little pressure with her thoughts and speech. She is aox3, demonstrates good judgement.  Nursing note and vitals reviewed.   ED Course  Procedures (including critical care time) Labs Review Labs Reviewed  URINALYSIS, ROUTINE W REFLEX MICROSCOPIC - Abnormal; Notable for the following:    Hgb urine dipstick TRACE (*)    All other components within normal limits  COMPREHENSIVE METABOLIC PANEL - Abnormal; Notable for the following:    GFR calc non Af Amer 69 (*)    GFR calc Af Amer 80 (*)    Anion gap 4 (*)    All other components within normal limits  URINE MICROSCOPIC-ADD ON - Abnormal; Notable for the following:    Squamous Epithelial / LPF FEW (*)    Bacteria, UA FEW (*)    All other components within normal limits  I-STAT VENOUS BLOOD GAS, ED - Abnormal; Notable for the following:    pH, Ven 7.342 (*)    pCO2, Ven 43.7 (*)    All other components within normal limits  CBC WITH DIFFERENTIAL/PLATELET  LIPASE, BLOOD  SALICYLATE LEVEL    Imaging Review US Abdomen Limited  09/25/2014   CLINICAL DATA:  Abdominal pain, right upper quadrant pain  for 2 weeks. Nausea, vomiting  EXAM: US ABDOMEN LIMITED - RIGHT UPPER QUADRANT  COMPARISON:  CT abdomen 12/16/2010, ultrasound abdomen 02/13/2011  FINDINGS: Gallbladder:  Contracted gallbladder. No gallstones or wall thickening visualized. No sonographic Murphy sign noted.  Common bile duct:  Diameter: 3 mm  Liver:  No focal lesion identified. Within normal limits in parenchymal echogenicity.  IMPRESSION: No sonographic evidence of acute cholecystitis.   Electronically Signed   By: Kathreen Devoid   On: 09/25/2014 11:42     EKG Interpretation   Date/Time:  Monday September 25 2014 10:29:36 EST Ventricular Rate:  53 PR Interval:  162 QRS Duration: 126 QT Interval:  476  QTC Calculation: 447 R Axis:   70 Text Interpretation:  Sinus rhythm Left atrial enlargement Right bundle  branch block LVH No significant change since last tracing Confirmed by  Kathrynn Humble, MD, Rhaelyn Comp [phone]) on 09/25/2014 10:43:34 AM      MDM   Final diagnoses:  Bipolar 1 disorder, manic, mild  RUQ abdominal pain  Anxiety    Pt comes in with some anxiety like symptoms, right sided abd pain, worse with po intake and nausea x several days. She appears to be slightly manic, not sleeping as well. Not taking her meds x 6 months, and er mania like sx have been present for 1 month now. She has been given [employer] just in case, but she has means of getting re-certifying with her psych, and she will try that first. [place] normal, pt has no murphys and abd pain is mild. She will see her pcp. Salicylate normal - she was taking way too many ibuprofen the last few days. Xanax, with coupon provided.    Varney Biles, MD 09/26/14 [phone]

## 2014-09-25 NOTE — ED Notes (Signed)
Dr. Kathrynn Humble at bedside with patient.

## 2014-09-25 NOTE — ED Notes (Signed)
MD at bedside. 

## 2014-09-25 NOTE — ED Notes (Signed)
Dr. Kathrynn Humble at bedside with the patient.

## 2014-09-25 NOTE — Discharge Instructions (Signed)
We saw you in the ER for the abdominal pain. All the results in the ER are normal, labs and imaging. We are not sure what is causing your symptoms. The workup in the ER is not complete, and is limited to screening for life threatening and emergent conditions only, so please see a primary care doctor for further evaluation. See your psych ream again to re-establish care with them.  TAKE XANAX ONLY WHEN ANXIOUS. DO NOT TAKE XANAX MORE THAN PRESCRIBED. DO NOT USE XANAX WITH ALCOHOL OR OTHER DRUGS.   Abdominal Pain, Women Abdominal (stomach, pelvic, or belly) pain can be caused by many things. It is important to tell your doctor:  The location of the pain.  Does it come and go or is it present all the time?  Are there things that start the pain (eating certain foods, exercise)?  Are there other symptoms associated with the pain (fever, nausea, vomiting, diarrhea)? All of this is helpful to know when trying to find the cause of the pain. CAUSES   Stomach: virus or bacteria infection, or ulcer.  Intestine: appendicitis (inflamed appendix), regional ileitis (Crohn's disease), ulcerative colitis (inflamed colon), irritable bowel syndrome, diverticulitis (inflamed diverticulum of the colon), or cancer of the stomach or intestine.  Gallbladder disease or stones in the gallbladder.  Kidney disease, kidney stones, or infection.  Pancreas infection or cancer.  Fibromyalgia (pain disorder).  Diseases of the female organs:  Uterus: fibroid (non-cancerous) tumors or infection.  Fallopian tubes: infection or tubal pregnancy.  Ovary: cysts or tumors.  Pelvic adhesions (scar tissue).  Endometriosis (uterus lining tissue growing in the pelvis and on the pelvic organs).  Pelvic congestion syndrome (female organs filling up with blood just before the menstrual period).  Pain with the menstrual period.  Pain with ovulation (producing an egg).  Pain with an IUD (intrauterine device,  birth control) in the uterus.  Cancer of the female organs.  Functional pain (pain not caused by a disease, may improve without treatment).  Psychological pain.  Depression. DIAGNOSIS  Your doctor will decide the seriousness of your pain by doing an examination.  Blood tests.  X-rays.  Ultrasound.  CT scan (computed tomography, special type of X-ray).  MRI (magnetic resonance imaging).  Cultures, for infection.  Barium enema (dye inserted in the large intestine, to better view it with X-rays).  Colonoscopy (looking in intestine with a lighted tube).  Laparoscopy (minor surgery, looking in abdomen with a lighted tube).  Major abdominal exploratory surgery (looking in abdomen with a large incision). TREATMENT  The treatment will depend on the cause of the pain.   Many cases can be observed and treated at home.  Over-the-counter medicines recommended by your caregiver.  Prescription medicine.  Antibiotics, for infection.  Birth control pills, for painful periods or for ovulation pain.  Hormone treatment, for endometriosis.  Nerve blocking injections.  Physical therapy.  Antidepressants.  Counseling with a psychologist or psychiatrist.  Minor or major surgery. HOME CARE INSTRUCTIONS   Do not take laxatives, unless directed by your caregiver.  Take over-the-counter pain medicine only if ordered by your caregiver. Do not take aspirin because it can cause an upset stomach or bleeding.  Try a clear liquid diet (broth or water) as ordered by your caregiver. Slowly move to a bland diet, as tolerated, if the pain is related to the stomach or intestine.  Have a thermometer and take your temperature several times a day, and record it.  Bed rest and sleep, if  it helps the pain.  Avoid sexual intercourse, if it causes pain.  Avoid stressful situations.  Keep your follow-up appointments and tests, as your caregiver orders.  If the pain does not go away with  medicine or surgery, you may try:  Acupuncture.  Relaxation exercises (yoga, meditation).  Group therapy.  Counseling. SEEK MEDICAL CARE IF:   You notice certain foods cause stomach pain.  Your home care treatment is not helping your pain.  You need stronger pain medicine.  You want your IUD removed.  You feel faint or lightheaded.  You develop nausea and vomiting.  You develop a rash.  You are having side effects or an allergy to your medicine. SEEK IMMEDIATE MEDICAL CARE IF:   Your pain does not go away or gets worse.  You have a fever.  Your pain is felt only in portions of the abdomen. The right side could possibly be appendicitis. The left lower portion of the abdomen could be colitis or diverticulitis.  You are passing blood in your stools (bright red or black tarry stools, with or without vomiting).  You have blood in your urine.  You develop chills, with or without a fever.  You pass out. MAKE SURE YOU:   Understand these instructions.  Will watch your condition.  Will get help right away if you are not doing well or get worse. Document Released: 05/25/2007 Document Revised: 12/12/2013 Document Reviewed: 06/14/2009 [facility] Patient Information 2015 [place], [place]. This information is not intended to replace advice given to you by your health care provider. Make sure you discuss any questions you have with your health care provider. Generalized Anxiety Disorder Generalized anxiety disorder (GAD) is a mental disorder. It interferes with life functions, including relationships, work, and school. GAD is different from normal anxiety, which everyone experiences at some point in their lives in response to specific life events and activities. Normal anxiety actually helps [place] prepare for and get through these life events and activities. Normal anxiety goes away after the event or activity is over.  GAD causes anxiety that is not necessarily related to specific  events or activities. It also causes excess anxiety in proportion to specific events or activities. The anxiety associated with GAD is also difficult to control. GAD can vary from mild to severe. People with severe GAD can have intense waves of anxiety with physical symptoms (panic attacks).  SYMPTOMS The anxiety and worry associated with GAD are difficult to control. This anxiety and worry are related to many life events and activities and also occur more days than not for 6 months or longer. People with GAD also have three or more of the following symptoms (one or more in children):  Restlessness.   Fatigue.  Difficulty concentrating.   Irritability.  Muscle tension.  Difficulty sleeping or unsatisfying sleep. DIAGNOSIS GAD is diagnosed through an assessment by your health care provider. Your health care provider will ask you questions aboutyour mood,physical symptoms, and events in your life. Your health care provider may ask you about your medical history and use of alcohol or drugs, including prescription medicines. Your health care provider may also do a physical exam and blood tests. Certain medical conditions and the use of certain substances can cause symptoms similar to those associated with GAD. Your health care provider may refer you to a mental health specialist for further evaluation. TREATMENT The following therapies are usually used to treat GAD:   Medication. Antidepressant medication usually is prescribed for long-term daily control. Antianxiety  medicines may be added in severe cases, especially when panic attacks occur.   Talk therapy (psychotherapy). Certain types of talk therapy can be helpful in treating GAD by providing support, education, and guidance. A form of talk therapy called cognitive behavioral therapy can teach you healthy ways to think about and react to daily life events and activities.  Stress managementtechniques. These include yoga, meditation, and  exercise and can be very helpful when they are practiced regularly. A mental health specialist can help determine which treatment is best for you. Some people see improvement with one therapy. However, other people require a combination of therapies. Document Released: 11/22/2012 Document Revised: 12/12/2013 Document Reviewed: 11/22/2012 [facility] Patient Information 2015 [place], [place]. This information is not intended to replace advice given to you by your health care provider. Make sure you discuss any questions you have with your health care provider.

## 2014-10-11 ENCOUNTER — Ambulatory Visit: Payer: Self-pay | Admitting: Internal Medicine

## 2014-10-17 ENCOUNTER — Encounter (HOSPITAL_COMMUNITY): Payer: Self-pay | Admitting: Emergency Medicine

## 2014-10-17 ENCOUNTER — Emergency Department (HOSPITAL_COMMUNITY)
Admission: EM | Admit: 2014-10-17 | Discharge: 2014-10-17 | Disposition: A | Discharge status: Home / Self Care | Payer: Self-pay | Attending: Emergency Medicine | Admitting: Emergency Medicine

## 2014-10-17 ENCOUNTER — Telehealth: Payer: Self-pay | Admitting: Internal Medicine

## 2014-10-17 DIAGNOSIS — Z8742 Personal history of other diseases of the female genital tract: Secondary | ICD-10-CM | POA: Insufficient documentation

## 2014-10-17 DIAGNOSIS — K029 Dental caries, unspecified: Secondary | ICD-10-CM | POA: Insufficient documentation

## 2014-10-17 DIAGNOSIS — K088 Other specified disorders of teeth and supporting structures: Secondary | ICD-10-CM | POA: Insufficient documentation

## 2014-10-17 DIAGNOSIS — Z720 Tobacco use: Secondary | ICD-10-CM | POA: Insufficient documentation

## 2014-10-17 DIAGNOSIS — I10 Essential (primary) hypertension: Secondary | ICD-10-CM | POA: Insufficient documentation

## 2014-10-17 DIAGNOSIS — K0889 Other specified disorders of teeth and supporting structures: Secondary | ICD-10-CM

## 2014-10-17 DIAGNOSIS — Z79899 Other long term (current) drug therapy: Secondary | ICD-10-CM | POA: Insufficient documentation

## 2014-10-17 DIAGNOSIS — Z792 Long term (current) use of antibiotics: Secondary | ICD-10-CM | POA: Insufficient documentation

## 2014-10-17 DIAGNOSIS — F329 Major depressive disorder, single episode, unspecified: Secondary | ICD-10-CM | POA: Insufficient documentation

## 2014-10-17 MED ORDER — CLINDAMYCIN HCL 150 MG PO CAPS: 150.0000 mg | ORAL_CAPSULE | Freq: Four times a day (QID) | ORAL | Status: DC

## 2014-10-17 MED ORDER — OXYCODONE-ACETAMINOPHEN 5-325 MG PO TABS
1.0000 | ORAL_TABLET | Freq: Four times a day (QID) | ORAL | Status: DC | PRN
Start: 2014-10-17 — End: 2015-01-11

## 2014-10-17 MED ORDER — BUPIVACAINE-EPINEPHRINE (PF) 0.5% -1:200000 IJ SOLN
1.8000 mL | Freq: Once | INTRAMUSCULAR | Status: AC
  Administered 2014-10-17: 1.8 mL
  Filled 2014-10-17: qty 1.8

## 2014-10-17 NOTE — ED Notes (Signed)
Pt c/o right lower dental pain that got ":heated" yesterday. Pt states that the dental pain radiates to lower jaw and right ear.

## 2014-10-17 NOTE — Progress Notes (Signed)
1217 Discussed need to merge pt charts with [place] in [place] ED registration Cm noted various charts on pt from [place] seen by [facility] staff, Marzetta Board. Has an appt at internal medicine on 10/18/14 already noted in EPIC This pt keep telling [facility] she did not want to speak with her and keep interrupting [facility] staff who was attempting to tell her about her appt and that after her appt she can get a referral for dental services [facility] staff was finally able to tell pt the information needed after pt took a pause after constantly stating she was in so much pain to talk and could not listen to [facility] staff   54 yr old self pay [employer] pt c/o dental pain

## 2014-10-17 NOTE — Telephone Encounter (Signed)
Call to patient to confirm appointment for 10/18/14 at 1:45 vm not set up yet

## 2014-10-17 NOTE — Discharge Instructions (Signed)

## 2014-10-17 NOTE — ED Provider Notes (Signed)
CSN: [id number]     Arrival date & time 10/17/14  1114 History   First MD Initiated Contact with Patient 10/17/14 1154     Chief Complaint  Patient presents with  . Dental Pain     (Consider location/radiation/quality/duration/timing/severity/associated sxs/prior Treatment) HPI   PCP: Charlott Rakes, MD Blood pressure 127/88, pulse 53, temperature 98.3 F (36.8 C), temperature source Oral, resp. rate 18, last menstrual period 12/04/2009, SpO2 97 %.  Jennifer [name] is a 54 y.o.female with a significant PMH of fibroids, LVH, hypertension, bipolar, depression presents to the ER with complaints of  dental complaint pain. Symptoms began a few weeks ago, he was seen in the ER, given a dental block and antibiotics. She reports that they did not work. She is currently waiting for her insurance information goes through and maxillofacial call HER to schedule appointment. She requests that I put her to sleep and take out the tooth. The patient has tried to alleviate pain with penicillin and Ibuprofen prescribed to her. She reports it did not help.  Pain rated at a 10/10, characterized as throbbing in nature and located right lower molar. Patient denies fever, night sweats, chills, difficulty swallowing or opening mouth, SOB, nuchal rigidity or decreased ROM of neck.  Patient does not have a dentist and requests a resource guide at discharge.    Past Medical History  Diagnosis Date  . Fibroids     now s/p hysterectomy  . Left ventricular hypertrophy   . Polysubstance abuse     IV drug [place], cocaine on occassion, smoking and alcoholism, has been going to AA, not completely sober  . Hypertension   . bipolar   . Depression    Past Surgical History  Procedure Laterality Date  . Abdominal hysterectomy      2003  . Exploratory laparotomy      complex pelvic mass 2011  . Bilateral salpingoophorectomy      01/2010  . Dilation and curettage of uterus    . Tonsillectomy     Family History    Problem Relation Age of Onset  . Hypertension Mother   . Hypertension Sister   . Diabetes Father    History  Substance Use Topics  . Smoking status: Current Every Day Smoker -- 0.50 packs/day for 16 years    Types: Cigarettes  . Smokeless tobacco: Never Used     Comment: Wants to quit.  Cutting back - smokes mainly at night.  . Alcohol Use: No     Comment: Occasional drinker   OB History    Gravida Para Term Preterm AB TAB SAB Ectopic Multiple Living   5 0 0 0 4 0 1 0 0 0      Review of Systems  10 Systems reviewed and are negative for acute change except as noted in the HPI.    Allergies  Review of patient's allergies indicates no known allergies.  Home Medications   Prior to Admission medications   Medication Sig Start Date End Date Taking? Authorizing Provider  ALPRAZolam Duanne Moron) 0.5 MG tablet Take 1 tablet (0.5 mg total) by mouth 3 (three) times daily as needed for anxiety. 09/25/14   Varney Biles, MD  atenolol (TENORMIN) 50 MG tablet Take 1 tablet (50 mg total) by mouth daily. 09/18/14   Rushil Sherrye Payor, MD  clindamycin (CLEOCIN) 150 MG capsule Take 1 capsule (150 mg total) by mouth every 6 (six) hours. 10/17/14   Edom Schmuhl Carlota Raspberry, PA-C  ibuprofen (ADVIL,MOTRIN) 800 MG tablet Take  1 tablet (800 mg total) by mouth 3 (three) times daily. 09/07/14   Domenic Moras, PA-C  lisinopril-hydrochlorothiazide (PRINZIDE,ZESTORETIC) 20-12.5 MG per tablet Take 1 tablet by mouth daily. 09/21/14   Rushil Sherrye Payor, MD  omeprazole (PRILOSEC OTC) 20 MG tablet Take 1 tablet (20 mg total) by mouth daily. 09/11/14   Riccardo Dubin, MD  oxyCODONE-acetaminophen (PERCOCET/ROXICET) 5-325 MG per tablet Take 1 tablet by mouth every 6 (six) hours as needed for severe pain. 10/17/14   Delos Haring, PA-C  penicillin v potassium (VEETID) 500 MG tablet Take 1 tablet (500 mg total) by mouth 3 (three) times daily. 09/07/14   Domenic Moras, PA-C  traMADol (ULTRAM) 50 MG tablet Take 1 tablet (50 mg total) by mouth every 6  (six) hours as needed. Patient not taking: Reported on 09/11/2014 01/26/14   Hazel Sams, PA-C   BP 127/88 mmHg  Pulse 53  Temp(Src) 98.3 F (36.8 C) (Oral)  Resp 18  SpO2 97%  LMP 12/04/2009 Physical Exam  Constitutional: She appears well-developed and well-nourished. No distress.  HENT:  Head: Normocephalic and atraumatic.  Mouth/Throat: Uvula is midline, oropharynx is clear and moist and mucous membranes are normal. No trismus in the jaw. Normal dentition. Dental caries (Pts tooth shows no obvious abscess but moderate to severe tenderness to palpation of marked tooth) present. No dental abscesses, uvula swelling or lacerations.    Eyes: Pupils are equal, round, and reactive to light.  Neck: Trachea normal, normal range of motion and full passive range of motion without pain. Neck supple.  Cardiovascular: Normal rate, regular rhythm, normal heart sounds and normal pulses.   Pulmonary/Chest: Effort normal and breath sounds normal. No respiratory distress. Chest wall is not dull to percussion. She exhibits no tenderness, no crepitus, no edema, no deformity and no retraction.  Abdominal: Soft. Normal appearance.  Musculoskeletal: Normal range of motion.  Neurological: She is alert. She has normal strength.  Skin: Skin is warm, dry and intact. She is not diaphoretic.  Psychiatric: She has a normal mood and affect. Her speech is normal. Cognition and memory are normal.  Nursing note and vitals reviewed.   ED Course  Procedures (including critical care time) Labs Review Labs Reviewed - No data to display  Imaging Review No results found.   EKG Interpretation None      MDM   Final diagnoses:  Toothache    NERVE BLOCK Date/Time: 10/17/2014 Performed by: Linus Mako Authorized by: Linus Mako Consent: Verbal consent obtained. Risks and benefits: risks, benefits and alternatives were discussed Consent given by: patient Indications: pain relief Body area:  face/mouth Laterality: left Needle gauge: 25 G Local anesthetic: lidocaine 2% without epinephrine Anesthetic total: 2 ml Outcome: pain improved Patient tolerance: Patient tolerated the procedure well with no immediate complications. Comments: Patient had significant relief of pain.  Ibuprofen and Penicillin did not help alleviate her pain. Will give 10# of Percocet and try Clindamycin, she has been made aware Clindamycin can cause nausea, vomiting, diarrhea. To dc and come back if this happens. Advised to call PCP/ oral surgeon on status of appointment.  54 y.o.Jennifer [name]'s evaluation in the Emergency Department is complete. It has been determined that no acute conditions requiring further emergency intervention are present at this time. The patient/guardian have been advised of the diagnosis and plan. We have discussed signs and symptoms that warrant return to the ED, such as changes or worsening in symptoms.  Vital signs are stable at discharge. Filed Vitals:  10/17/14 1119  BP: 127/88  Pulse: 53  Temp: 98.3 F (36.8 C)  Resp: 18    Patient/guardian has voiced understanding and agreed to follow-up with the PCP or specialist.      Delos Haring, PA-C 10/17/14 [place], MD 10/17/14 1718

## 2014-10-18 ENCOUNTER — Ambulatory Visit: Payer: Self-pay | Admitting: Internal Medicine

## 2014-10-19 ENCOUNTER — Telehealth: Payer: Self-pay | Admitting: Internal Medicine

## 2014-10-19 NOTE — Telephone Encounter (Signed)
Call to patient to confirm appointment for 10/20/14 at 10:15 vm not setup

## 2014-10-20 ENCOUNTER — Encounter: Payer: Self-pay | Admitting: Internal Medicine

## 2014-10-20 ENCOUNTER — Ambulatory Visit: Payer: Self-pay | Admitting: Internal Medicine

## 2014-10-26 ENCOUNTER — Emergency Department (HOSPITAL_COMMUNITY)
Admission: EM | Admit: 2014-10-26 | Discharge: 2014-10-26 | Disposition: A | Discharge status: Home / Self Care | Payer: Self-pay | Attending: Emergency Medicine | Admitting: Emergency Medicine

## 2014-10-26 ENCOUNTER — Encounter (HOSPITAL_COMMUNITY): Payer: Self-pay | Admitting: Emergency Medicine

## 2014-10-26 DIAGNOSIS — I517 Cardiomegaly: Secondary | ICD-10-CM | POA: Insufficient documentation

## 2014-10-26 DIAGNOSIS — F329 Major depressive disorder, single episode, unspecified: Secondary | ICD-10-CM | POA: Insufficient documentation

## 2014-10-26 DIAGNOSIS — I10 Essential (primary) hypertension: Secondary | ICD-10-CM | POA: Insufficient documentation

## 2014-10-26 DIAGNOSIS — Z79899 Other long term (current) drug therapy: Secondary | ICD-10-CM | POA: Insufficient documentation

## 2014-10-26 DIAGNOSIS — K047 Periapical abscess without sinus: Secondary | ICD-10-CM | POA: Insufficient documentation

## 2014-10-26 DIAGNOSIS — K029 Dental caries, unspecified: Secondary | ICD-10-CM | POA: Insufficient documentation

## 2014-10-26 DIAGNOSIS — Z720 Tobacco use: Secondary | ICD-10-CM | POA: Insufficient documentation

## 2014-10-26 DIAGNOSIS — Z8742 Personal history of other diseases of the female genital tract: Secondary | ICD-10-CM | POA: Insufficient documentation

## 2014-10-26 DIAGNOSIS — Z792 Long term (current) use of antibiotics: Secondary | ICD-10-CM | POA: Insufficient documentation

## 2014-10-26 MED ORDER — METRONIDAZOLE 500 MG PO TABS: 500.0000 mg | ORAL_TABLET | Freq: Three times a day (TID) | ORAL | Status: DC

## 2014-10-26 MED ORDER — BUPIVACAINE-EPINEPHRINE (PF) 0.5% -1:200000 IJ SOLN
1.8000 mL | Freq: Once | INTRAMUSCULAR | Status: AC
  Administered 2014-10-26: 1.8 mL

## 2014-10-26 MED ORDER — HYDROCODONE-ACETAMINOPHEN 5-325 MG PO TABS: ORAL_TABLET | ORAL | Status: DC

## 2014-10-26 MED ORDER — AMOXICILLIN 500 MG PO CAPS: 500.0000 mg | ORAL_CAPSULE | Freq: Three times a day (TID) | ORAL | Status: DC

## 2014-10-26 NOTE — Discharge Instructions (Signed)
Take vicodin for breakthrough pain, do not drink alcohol, drive, care for children or do other critical tasks while taking vicodin.  Return to the emergency room for fever, change in vision, redness to the face that rapidly spreads towards the eye, nausea or vomiting, difficulty swallowing or shortness of breath.   Apply warm compresses to jaw throughout the day.   Take your antibiotics as directed and to the end of the course. DO NOT drink alcohol when taking metronidazole, it will make you very sick!   Followup with a dentist is very important for ongoing evaluation and management of recurrent dental pain. Return to emergency department for emergent changing or worsening symptoms."  Low-cost dental clinic: [facility]  at [phone]**  **[place] at [phone] [address]**    You may also call [phone]  Dental Assistance If the dentist on-call cannot see you, please use the resources below:   Patients with Medicaid: [facility] [id number], [place] [address] [phone]  If unable to pay, or uninsured, contact [facility] [phone]) or [place] [phone] in [place], [place] in [facility]) to become qualified for the adult dental clinic  Other [place]- [place], [place], [place], [address]    [phone], Ext. [phone]    2nd and 4th Thursday of the month at 6:30am    10 clients each day by appointment, can sometimes see walk-in     patients if someone does not show for an appointment [place], [place], [place], [address]    [phone] [facility]- [address], [place], [place], [address]    [phone]  [place] Department- [phone] [place] [facility] Department- [phone]

## 2014-10-26 NOTE — ED Notes (Signed)
Pt c/o right sided dental pain x 2 days; pt sts just finished taking antibiotics and still having pain

## 2014-10-26 NOTE — ED Provider Notes (Signed)
CSN: [id number]     Arrival date & time 10/26/14  1017 History   First MD Initiated Contact with Patient 10/26/14 1037     Chief Complaint  Patient presents with  . Dental Pain     (Consider location/radiation/quality/duration/timing/severity/associated sxs/prior Treatment)  HPI   WM FRUCHTER is a 54 y.o. female complaining of severe right lower tooth pain and dental. Associated with swelling. Patient was on a course of Pen-Vee K, she has just completed a course of clindamycin and states that the swelling still persists. She has not followed up with dentist. She rates pain as 10 out of 10, not relieved by over-the-counter medications. Exacerbated by chewing and air movement. Denies fever/chills, difficulty opening jaw, difficulty swallowing, SOB,  neck swelling.    Past Medical History  Diagnosis Date  . Fibroids     now s/p hysterectomy  . Left ventricular hypertrophy   . Polysubstance abuse     IV drug [place], cocaine on occassion, smoking and alcoholism, has been going to AA, not completely sober  . Hypertension   . bipolar   . Depression    Past Surgical History  Procedure Laterality Date  . Abdominal hysterectomy      2003  . Exploratory laparotomy      complex pelvic mass 2011  . Bilateral salpingoophorectomy      01/2010  . Dilation and curettage of uterus    . Tonsillectomy     Family History  Problem Relation Age of Onset  . Hypertension Mother   . Hypertension Sister   . Diabetes Father    History  Substance Use Topics  . Smoking status: Current Every Day Smoker -- 0.50 packs/day for 16 years    Types: Cigarettes  . Smokeless tobacco: Never Used     Comment: Wants to quit.  Cutting back - smokes mainly at night.  . Alcohol Use: No     Comment: Occasional drinker   OB History    Gravida Para Term Preterm AB TAB SAB Ectopic Multiple Living   5 0 0 0 4 0 1 0 0 0      Review of Systems  10 systems reviewed and found to be negative, except as noted in  the HPI.   Allergies  Review of patient's allergies indicates no known allergies.  Home Medications   Prior to Admission medications   Medication Sig Start Date End Date Taking? Authorizing Provider  ALPRAZolam Duanne Moron) 0.5 MG tablet Take 1 tablet (0.5 mg total) by mouth 3 (three) times daily as needed for anxiety. 09/25/14   Varney Biles, MD  amoxicillin (AMOXIL) 500 MG capsule Take 1 capsule (500 mg total) by mouth 3 (three) times daily. 10/26/14   Jerrett Baldinger, PA-C  atenolol (TENORMIN) 50 MG tablet Take 1 tablet (50 mg total) by mouth daily. 09/18/14   Rushil Sherrye Payor, MD  clindamycin (CLEOCIN) 150 MG capsule Take 1 capsule (150 mg total) by mouth every 6 (six) hours. 10/17/14   Delos Haring, PA-C  HYDROcodone-acetaminophen (NORCO/VICODIN) 5-325 MG per tablet Take 1-2 tablets by mouth every 6 hours as needed for pain. 10/26/14   Rodarius Kichline, PA-C  ibuprofen (ADVIL,MOTRIN) 800 MG tablet Take 1 tablet (800 mg total) by mouth 3 (three) times daily. 09/07/14   Domenic Moras, PA-C  lisinopril-hydrochlorothiazide (PRINZIDE,ZESTORETIC) 20-12.5 MG per tablet Take 1 tablet by mouth daily. 09/21/14   Rushil Sherrye Payor, MD  metroNIDAZOLE (FLAGYL) 500 MG tablet Take 1 tablet (500 mg total) by mouth 3 (three)  times daily. One tab PO bid x 10 days 10/26/14   Elmyra Ricks Delissa Silba, PA-C  omeprazole (PRILOSEC OTC) 20 MG tablet Take 1 tablet (20 mg total) by mouth daily. 09/11/14   Riccardo Dubin, MD  oxyCODONE-acetaminophen (PERCOCET/ROXICET) 5-325 MG per tablet Take 1 tablet by mouth every 6 (six) hours as needed for severe pain. 10/17/14   Delos Haring, PA-C  penicillin v potassium (VEETID) 500 MG tablet Take 1 tablet (500 mg total) by mouth 3 (three) times daily. 09/07/14   Domenic Moras, PA-C  traMADol (ULTRAM) 50 MG tablet Take 1 tablet (50 mg total) by mouth every 6 (six) hours as needed. Patient not taking: Reported on 09/11/2014 01/26/14   Hazel Sams, PA-C   BP 142/89 mmHg  Pulse 60  Temp(Src) 98.4 F (36.9 C)  (Oral)  Resp 18  SpO2 96%  LMP 12/04/2009 Physical Exam  Constitutional: She is oriented to person, place, and time. She appears well-developed and well-nourished. No distress.  HENT:  Head: Normocephalic and atraumatic.  Mouth/Throat: Oropharynx is clear and moist.  Patient has mild right mandibular swelling. Generally poor dentition with dental caries eroding into the gum. No focal fluctuance. erythema or tenderness to palpation. Patient is handling their secretions. There is no tenderness to palpation or firmness underneath tongue bilaterally. No trismus.    Eyes: Conjunctivae and EOM are normal. Pupils are equal, round, and reactive to light.  Neck: Normal range of motion.  Cardiovascular: Normal rate, regular rhythm and intact distal pulses.   Pulmonary/Chest: Effort normal and breath sounds normal. No stridor. No respiratory distress. She has no wheezes. She has no rales. She exhibits no tenderness.  Abdominal: Soft. Bowel sounds are normal.  Musculoskeletal: Normal range of motion.  Neurological: She is alert and oriented to person, place, and time.  Psychiatric: She has a normal mood and affect.  Nursing note and vitals reviewed.   ED Course  NERVE BLOCK Date/Time: 10/26/2014 11:04 AM Performed by: Monico Blitz Authorized by: Monico Blitz Consent: Verbal consent obtained. Consent given by: patient Patient identity confirmed: verbally with patient Body area: face/mouth Nerve: inferior alveolar Laterality: right Patient sedated: no Patient position: sitting Needle gauge: 79 G Location technique: anatomical landmarks Local anesthetic: bupivacaine 0.5% with epinephrine Anesthetic total: 1.8 ml Outcome: pain improved Patient tolerance: Patient tolerated the procedure well with no immediate complications   (including critical care time) Labs Review Labs Reviewed - No data to display  Imaging Review No results found.   EKG Interpretation None      MDM    Final diagnoses:  Dental abscess  Pain due to dental caries    Filed Vitals:   10/26/14 1036  BP: 142/89  Pulse: 60  Temp: 98.4 F (36.9 C)  TempSrc: Oral  Resp: 18  SpO2: 96%    Medications  bupivacaine-epinephrine (MARCAINE W/ EPI) 0.5% -1:200000 injection 1.8 mL (1.8 mLs Infiltration Given 10/26/14 1106)    AVORY RAHIMI is a pleasant 54 y.o. female presenting with persistent dental abscess and dental pain. Nerve block performed. I have offered patient I and D, there is no focal area of fluctuance however based on the length of her symptoms and time that she's been on antibiotics and lack of ability to follow with the dentist I'm willing to try incision and drainage however patient has declined. I will switch her to amoxicillin and Flagyl. I stressed the importance of following with a dentist. She verbalized her understanding.  Evaluation does not show pathology that would require  ongoing emergent intervention or inpatient treatment. Pt is hemodynamically stable and mentating appropriately. Discussed findings and plan with patient/guardian, who agrees with care plan. All questions answered. Return precautions discussed and outpatient follow up given.   New Prescriptions   AMOXICILLIN (AMOXIL) 500 MG CAPSULE    Take 1 capsule (500 mg total) by mouth 3 (three) times daily.   HYDROCODONE-ACETAMINOPHEN (NORCO/VICODIN) 5-325 MG PER TABLET    Take 1-2 tablets by mouth every 6 hours as needed for pain.   METRONIDAZOLE (FLAGYL) 500 MG TABLET    Take 1 tablet (500 mg total) by mouth 3 (three) times daily. One tab PO bid x 10 days         Monico Blitz, PA-C 10/26/14 1451  Leonard Schwartz, MD 10/30/14 1036

## 2014-10-26 NOTE — ED Notes (Signed)
Declined W/C at D/C and was escorted to lobby by RN. 

## 2014-11-27 ENCOUNTER — Ambulatory Visit (INDEPENDENT_AMBULATORY_CARE_PROVIDER_SITE_OTHER): Payer: Self-pay | Admitting: Internal Medicine

## 2014-11-27 ENCOUNTER — Encounter: Payer: Self-pay | Admitting: Internal Medicine

## 2014-11-27 VITALS — BP 139/92 | HR 62 | Temp 98.7°F | Ht 64.0 in | Wt 225.2 lb

## 2014-11-27 DIAGNOSIS — K219 Gastro-esophageal reflux disease without esophagitis: Secondary | ICD-10-CM

## 2014-11-27 DIAGNOSIS — Z113 Encounter for screening for infections with a predominantly sexual mode of transmission: Secondary | ICD-10-CM

## 2014-11-27 DIAGNOSIS — K029 Dental caries, unspecified: Secondary | ICD-10-CM

## 2014-11-27 MED ORDER — CLINDAMYCIN HCL 150 MG PO CAPS: 450.0000 mg | ORAL_CAPSULE | Freq: Three times a day (TID) | ORAL | Status: DC

## 2014-11-27 MED ORDER — OMEPRAZOLE MAGNESIUM 20 MG PO TBEC: 20.0000 mg | DELAYED_RELEASE_TABLET | Freq: Every day | ORAL | Status: DC

## 2014-11-27 MED ORDER — HYDROCODONE-ACETAMINOPHEN 5-325 MG PO TABS: ORAL_TABLET | ORAL | Status: DC

## 2014-11-27 NOTE — Progress Notes (Signed)
   Subjective:    [name], female    DOB: 1961-04-11, 54 y.o.   MRN: [id number]  HPI Comments: 54 y.o GERD, fibroid uterus, HTN, depression/anxiety, poor dentition  She presents for f/u  1. She reports exposure to HIV with female partner she has been dealing with on and off x 2-3 years.  She ended things with him Saturday and kicked him out and then he called her and stated he was HIV +. She was last sexually active with him on Thurs. Last week w/o protection.  She wants STD screening. Pt declines GYN exam today   2. She reports non resolution to right lower tooth abscess and dental pain.  She went to dentist and he will not take the tooth out b/c still actively inflamed.        Review of Systems  HENT: Positive for dental problem.   Respiratory: Negative for shortness of breath.   Cardiovascular: Negative for chest pain.       Objective:   Physical Exam  Constitutional: She is oriented to person, place, and time. Vital signs are normal. She appears well-developed and well-nourished. She is cooperative. No distress.  HENT:  Head: Normocephalic and atraumatic.  Mouth/Throat: Oropharynx is clear and moist and mucous membranes are normal. Abnormal dentition. Dental caries present. No dental abscesses. No oropharyngeal exudate.    Eyes: Conjunctivae are normal. Right eye exhibits no discharge. Left eye exhibits no discharge. No scleral icterus.  Cardiovascular: Normal rate, regular rhythm, S1 normal, S2 normal and normal heart sounds.   No murmur heard. Pulmonary/Chest: Effort normal and breath sounds normal. No respiratory distress. She has no wheezes.  Neurological: She is alert and oriented to person, place, and time. Coordination normal.  Skin: Skin is warm, dry and intact. No rash noted. She is not diaphoretic.  Psychiatric: Her speech is normal and behavior is normal. Judgment and thought content normal. Her mood appears anxious. Cognition and memory are  normal.  Nursing note and vitals reviewed.         Assessment & Plan:  F/u in 3 months recheck HIV

## 2014-11-27 NOTE — Assessment & Plan Note (Signed)
Rx refill of Prilosec today

## 2014-11-27 NOTE — Assessment & Plan Note (Signed)
Checked HIV, RPR, HSV, GC urine, hepatitis C, HBIgMcore, HBsAb, HBsAg  Will need repeat HIV in 3 months

## 2014-11-27 NOTE — Progress Notes (Signed)
Internal Medicine Clinic Attending  Case discussed with Dr. McLean at the time of the visit.  We reviewed the resident's history and exam and pertinent patient test results.  I agree with the assessment, diagnosis, and plan of care documented in the resident's note. 

## 2014-11-27 NOTE — Patient Instructions (Addendum)
General Instructions: Please follow up in 3-6 months to et repeat HIV check  Take care  I will let you know about your labs  Please follow up with dental    Treatment Goals:  Goals (1 Years of Data) as of 11/27/14          As of Today 10/26/14 10/17/14 09/25/14 09/25/14     Blood Pressure   . Blood Pressure < 140/90  139/92 142/89 127/88 119/76 126/72      Progress Toward Treatment Goals:  Treatment Goal 11/27/2014  Blood pressure at goal    Self Care Goals & Plans:  Self Care Goal 11/27/2014  Manage my medications take my medicines as prescribed; bring my medications to every visit; refill my medications on time; follow the sick day instructions if I am sick  Monitor my health keep track of my blood pressure  Eat healthy foods drink diet soda or water instead of juice or soda; eat more vegetables; eat foods that are low in salt; eat baked foods instead of fried foods; eat fruit for snacks and desserts; eat smaller portions  Be physically active find an activity I enjoy  Stop smoking -  Meeting treatment goals maintain the current self-care plan    No flowsheet data found.   Care Management & Community Referrals:  Referral 11/27/2014  Referrals made for care management support none needed  Referrals made to community resources none       Sexually Transmitted Disease A sexually transmitted disease (STD) is a disease or infection that may be passed (transmitted) from person to person, usually during sexual activity. This may happen by way of saliva, semen, blood, vaginal mucus, or urine. Common STDs include:   Gonorrhea.   Chlamydia.   Syphilis.   HIV and AIDS.   Genital herpes.   Hepatitis B and C.   Trichomonas.   Human papillomavirus (HPV).   Pubic lice.   Scabies.  Mites.  Bacterial vaginosis. WHAT ARE CAUSES OF STDs? An STD may be caused by bacteria, a virus, or parasites. STDs are often transmitted during sexual activity if one person is  infected. However, they may also be transmitted through nonsexual means. STDs may be transmitted after:   Sexual intercourse with an infected person.   Sharing sex toys with an infected person.   Sharing needles with an infected person or using unclean piercing or tattoo needles.  Having intimate contact with the genitals, mouth, or rectal areas of an infected person.   Exposure to infected fluids during birth. WHAT ARE THE SIGNS AND SYMPTOMS OF STDs? Different STDs have different symptoms. Some people may not have any symptoms. If symptoms are present, they may include:   Painful or bloody urination.   Pain in the pelvis, abdomen, vagina, anus, throat, or eyes.   A skin rash, itching, or irritation.  Growths, ulcerations, blisters, or sores in the genital and anal areas.  Abnormal vaginal discharge with or without bad odor.   Penile discharge in men.   Fever.   Pain or bleeding during sexual intercourse.   Swollen glands in the groin area.   Yellow skin and eyes (jaundice). This is seen with hepatitis.   Swollen testicles.  Infertility.  Sores and blisters in the mouth. HOW ARE STDs DIAGNOSED? To make a diagnosis, your health care provider may:   Take a medical history.   Perform a physical exam.   Take a sample of any discharge to examine.  Swab the throat, cervix, opening  to the penis, rectum, or vagina for testing.  Test a sample of your first morning urine.   Perform blood tests.   Perform a Pap test, if this applies.   Perform a colposcopy.   Perform a laparoscopy.  HOW ARE STDs TREATED? Treatment depends on the STD. Some STDs may be treated but not cured.   Chlamydia, gonorrhea, trichomonas, and syphilis can be cured with antibiotic medicine.   Genital herpes, hepatitis, and HIV can be treated, but not cured, with prescribed medicines. The medicines lessen symptoms.   Genital warts from HPV can be treated with medicine or  by freezing, burning (electrocautery), or surgery. Warts may come back.   HPV cannot be cured with medicine or surgery. However, abnormal areas may be removed from the cervix, vagina, or vulva.   If your diagnosis is confirmed, your recent sexual partners need treatment. This is true even if they are symptom-free or have a negative culture or evaluation. They should not have sex until their health care providers say it is okay. HOW CAN I REDUCE MY RISK OF GETTING AN STD? Take these steps to reduce your risk of getting an STD:  Use latex condoms, dental dams, and water-soluble lubricants during sexual activity. Do not use petroleum jelly or oils.  Avoid having multiple sex partners.  Do not have sex with someone who has other sex partners.  Do not have sex with anyone you do not know or who is at high risk for an STD.  Avoid risky sex practices that can break your skin.  Do not have sex if you have open sores on your mouth or skin.  Avoid drinking too much alcohol or taking illegal drugs. Alcohol and drugs can affect your judgment and put you in a vulnerable position.  Avoid engaging in oral and anal sex acts.  Get vaccinated for HPV and hepatitis. If you have not received these vaccines in the past, talk to your health care provider about whether one or both might be right for you.   If you are at risk of being infected with HIV, it is recommended that you take a prescription medicine daily to prevent HIV infection. This is called pre-exposure prophylaxis (PrEP). You are considered at risk if:  You are a man who has sex with other men (MSM).  You are a heterosexual man or woman and are sexually active with more than one partner.  You take drugs by injection.  You are sexually active with a partner who has HIV.  Talk with your health care provider about whether you are at high risk of being infected with HIV. If you choose to begin PrEP, you should first be tested for HIV. You  should then be tested every 3 months for as long as you are taking PrEP.  WHAT SHOULD I DO IF I THINK I HAVE AN STD?  See your health care provider.   Tell your sexual partner(s). They should be tested and treated for any STDs.  Do not have sex until your health care provider says it is okay. WHEN SHOULD I GET IMMEDIATE MEDICAL CARE? Contact your health care provider right away if:   You have severe abdominal pain.  You are a man and notice swelling or pain in your testicles.  You are a woman and notice swelling or pain in your vagina. Document Released: 10/18/2002 Document Revised: 08/02/2013 Document Reviewed: 02/15/2013 [facility] Patient Information 2015 [place], [place]. This information is not intended to replace advice given  to you by your health care provider. Make sure you discuss any questions you have with your health care provider.  

## 2014-11-27 NOTE — Assessment & Plan Note (Signed)
Still with unresolved inflammation right lower tooth and bucca mucosa. Tx'ed with Clinda 150 q6 #28 on 3/8 and Amoxil 500 tid #30 and Flagyl 500 tid #30 on 3/17 and still not completely resolved  Though no obvious abscess on exam will tx with Clindamycin 450 tid x 1 week with hopes resolves so can see dental  Temp supply Norco 5-325 1-2 tid prn #42 no refills  F/u with Dental

## 2014-11-28 LAB — HEPATITIS C ANTIBODY: HCV AB: NEGATIVE

## 2014-11-28 LAB — HSV(HERPES SIMPLEX VRS) I + II AB-IGG
HSV 1 Glycoprotein G Ab, IgG: 15.76 IV — ABNORMAL HIGH
HSV 2 Glycoprotein G Ab, IgG: 6.65 IV — ABNORMAL HIGH

## 2014-11-28 LAB — HIV ANTIBODY (ROUTINE TESTING W REFLEX): HIV: NONREACTIVE

## 2014-11-28 LAB — HEPATITIS B SURFACE ANTIBODY,QUALITATIVE: Hep B S Ab: NEGATIVE

## 2014-11-28 LAB — RPR

## 2014-11-28 LAB — URINE CYTOLOGY ANCILLARY ONLY
CHLAMYDIA, DNA PROBE: NEGATIVE
Neisseria Gonorrhea: NEGATIVE

## 2014-11-28 LAB — HEPATITIS B CORE ANTIBODY, IGM: HEP B C IGM: NONREACTIVE

## 2014-11-28 LAB — HEPATITIS B SURFACE ANTIGEN: Hepatitis B Surface Ag: NEGATIVE

## 2014-11-29 LAB — HSV(HERPES SIMPLEX VRS) I + II AB-IGM: Herpes Simplex Vrs I&II-IgM Ab (EIA): 5.77 INDEX — ABNORMAL HIGH

## 2014-12-04 ENCOUNTER — Ambulatory Visit: Payer: Self-pay | Admitting: Internal Medicine

## 2014-12-07 ENCOUNTER — Telehealth: Payer: Self-pay | Admitting: Internal Medicine

## 2014-12-07 NOTE — Telephone Encounter (Signed)
Call to patient to confirm appointment for 12/08/14 at 9:15 vm is not set up

## 2014-12-08 ENCOUNTER — Ambulatory Visit: Payer: Self-pay | Admitting: Internal Medicine

## 2014-12-08 ENCOUNTER — Encounter: Payer: Self-pay | Admitting: Internal Medicine

## 2014-12-26 ENCOUNTER — Ambulatory Visit: Payer: Self-pay | Admitting: Internal Medicine

## 2015-01-11 ENCOUNTER — Ambulatory Visit (INDEPENDENT_AMBULATORY_CARE_PROVIDER_SITE_OTHER): Payer: Self-pay | Admitting: Internal Medicine

## 2015-01-11 ENCOUNTER — Encounter: Payer: Self-pay | Admitting: Internal Medicine

## 2015-01-11 VITALS — BP 132/84 | HR 51 | Temp 98.5°F | Ht 64.0 in | Wt 228.2 lb

## 2015-01-11 DIAGNOSIS — F17210 Nicotine dependence, cigarettes, uncomplicated: Secondary | ICD-10-CM

## 2015-01-11 DIAGNOSIS — H579 Unspecified disorder of eye and adnexa: Secondary | ICD-10-CM | POA: Insufficient documentation

## 2015-01-11 DIAGNOSIS — D1721 Benign lipomatous neoplasm of skin and subcutaneous tissue of right arm: Secondary | ICD-10-CM

## 2015-01-11 DIAGNOSIS — R001 Bradycardia, unspecified: Secondary | ICD-10-CM

## 2015-01-11 DIAGNOSIS — M65331 Trigger finger, right middle finger: Secondary | ICD-10-CM

## 2015-01-11 DIAGNOSIS — F419 Anxiety disorder, unspecified: Secondary | ICD-10-CM

## 2015-01-11 DIAGNOSIS — M25511 Pain in right shoulder: Secondary | ICD-10-CM

## 2015-01-11 DIAGNOSIS — D1720 Benign lipomatous neoplasm of skin and subcutaneous tissue of unspecified limb: Secondary | ICD-10-CM

## 2015-01-11 DIAGNOSIS — I10 Essential (primary) hypertension: Secondary | ICD-10-CM

## 2015-01-11 MED ORDER — ALPRAZOLAM 0.5 MG PO TABS: 0.5000 mg | ORAL_TABLET | Freq: Every evening | ORAL | Status: DC | PRN

## 2015-01-11 NOTE — Progress Notes (Signed)
Medicine attending: I personally interviewed and briefly examined this patient and reviewed pertinent  data together with resident physician Bryn Gulling and I concur with her evaluation and management plan. 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 Now having pain which I pointed out to her, is not from the lipoma but due to arthritis in her shoulder; crepitus on exam. She also showed me a trigger finger on her right hand.

## 2015-01-11 NOTE — Assessment & Plan Note (Addendum)
HR in 50s during office visit, on recheck of BP, HR not noted and patient left clinic. On review of prior records, EKG's in the past with sinus bradycardia and HR in 50s. Review of prior vitals also show HR as low as 50s and high as 90s. Likely secondary to home BB, she take atenolol 50mg  daily for several years. I do not see exactly reason on why she is on a BB at this time on chart review. She denied syncope, dizziness, or feeling light-headed.   -follow up next visit x1 week or sooner if symptomatic, advised to let us know right away  -consider adjustment of BB dose if symptomatic

## 2015-01-11 NOTE — Assessment & Plan Note (Addendum)
Patient presents today for recurrent complaints of right shoulder soft mass that she says is really bothersome to her and is slowly growing over the years. She endorses pain in right shoulder region, itching, and says it is very irritating and she wishes to have it removed. Noted to see sports medicine for the shoulder in 04/2012 and was apparently referred to surgery, although she does not recall this and has not been evaluated by them. We tried to place referral today, however, no current orange card slots available for the rest of the year. I did try speaking to someone in the office, but they again refused and referred me to [facility] the orange card referral coordinator [phone] ext [phone]. I called stacy and left a voicemail.   -surgery referral when willing to accept appointment for orange card--will need to be placed by pcp when available -she can try to payout of pocket which I informed her and she can call their office to try to set that up but it seems she may not be able to afford that -shoulder examined by Dr. Beryle Beams in clinic today who assured her that it is soft benign soft tissue mass -advised to follow up if any change in mass or worsening of pain or any swelling, erythema or weakness or numbness of extremity

## 2015-01-11 NOTE — Assessment & Plan Note (Addendum)
Reports suffering form anxiety and panic attacks for years and says controlled with xanax and is requesting refill today. She says she has discussed this with PCP in the past. She refuses to go to [facility] and actually denied going there before although noted on prior pcp note. She denies depression (although noted on problem list) but says anxiety is the main problem and lately feeling more with small things triggering her, such as on the bus today when someone was making a noise. She did say she felt a mild headache coming on currently. We discussed waiting and talking with pcp further, although she is requesting refill of xanax until then. I reviewed prior records on epic, last refill from the ED for limited supply in februrary. In the past she has been on xanax 0.5mg  qhs prn. Per Dr. Serita Grit last assessment, she is noted to have a hx of bipolar disorder and he was leery of starting any antipsychotic or SSRI. He deferred until next appointment.   Given that she was here for an acute visit with for her shoulder pain, I recommend that she return for a dedicated visit to address her anxiety with her PCP. Dr. Posey Pronto is currently on vacation but will be back next week. As such, I discussed with Dr. Beryle Beams and I will give her a 1 week limited supply of xanax 0.5mg  qhs prn for now and will send a message to her PCP to follow up with patient next week when he returns. She is also asked to schedule an appointment for next week with pcp or call the clinic to discuss further medications and management with him which she agrees to do. I have counseled her that xanax may make her tired and thus she is not to drive or operate heavy machinery while on this medication and I also provided her literature of the medication on her avs to review. She reports no issues with taking xanax in the past and tolerates it well.   -follow up with PCP as listed above -encouraged patient to reconsider going to [facility] in the meantime,  however she adamantly refused at this time -endorsed mild headache that could be associated with her stress and anxiety. Patient reports getting headaches occasionally. If persistent or recurrent would recommend headache diary and follow up visit in clinic. Of note, I did call her to see if she got her xray after visit and she said her headache was gone.

## 2015-01-11 NOTE — Assessment & Plan Note (Addendum)
Patient requesting optho referral for evaluation. Endorses occasionally feeling like tiny rocks in eye but denies any eye pain or vision change. She also says her eyes have a reddish/brown color that she wants gone. On exam today, EOMI, appears to have small speckles of multiple brownish ?pigment on b/l sides of sclera in both eyes and patient denies any eye pain or vision disturbance, no blurriness. She says her eyes have been like that for a long time but she would like it to be gone and seen by an eye doctor. Unclear etiology, does not appear to be acute per patient reports and not associated with eye pain or vision disturbance. No obvious tearing of eyes or visible blood.   -optho referral placed today

## 2015-01-11 NOTE — Assessment & Plan Note (Addendum)
She showed this to Dr. Beryle Beams near the end of the exam, noted in right middle finger. We mainly addressed the right shoulder today but would recommend conservative treatment for now.  -she will require activity modification avoid pinching or grasping of the fingers and for now she does take NSAIDS PRN for her shoulder pain that may help with the finger as well. Next step would be to try splinting and/or return to sports medicine for further evaluation possible glucocorticoid injection?, will defer this to next follow up visit x1 week or sooner if worsening.

## 2015-01-11 NOTE — Patient Instructions (Signed)
Dear [name],  General Instructions:  Thank you for coming in today. Please get your shoulder xray done and follow up with Dr. Posey Pronto for your next visit. Please keep an eye on your shoulder mass and if getting bigger or painful, return to clinic for follow up and the clinic will try to call you as soon as they can get you in to see the surgeons. You can try over the counter tylenol for your shoulder pain if needed, do not take more than 4g in one day.  If you decide to see them without the orange card, please feel free to call them for an appointment with [facility].   I have refilled a limited supply of your anxiety medication but you will need to follow up with your PCP Dr. Posey Pronto next week for further discussion of your anxiety. Please consider returning to [facility] services. Xanax may make you sleepy, therefore, please do not take this medication if driving or operating heavy machinery and review the medication information listed below  Please bring your medicines with you each time you come to clinic.  Medicines may include prescription medications, over-the-counter medications, herbal remedies, eye drops, vitamins, or other pills.   Progress Toward Treatment Goals:  Treatment Goal 01/11/2015  Blood pressure deteriorated  Stop smoking smoking the same amount    Self Care Goals & Plans:  Self Care Goal 01/11/2015  Manage my medications take my medicines as prescribed; bring my medications to every visit; refill my medications on time  Monitor my health keep track of my blood pressure  Eat healthy foods eat more vegetables; eat foods that are low in salt; eat baked foods instead of fried foods  Be physically active find an activity I enjoy  Stop smoking call [facility] (1-800-QUIT-NOW)  Meeting treatment goals -    No flowsheet data found.   Care Management & Community Referrals:  Referral 11/27/2014  Referrals made for care management support none needed    Referrals made to community resources none     Alprazolam tablets What is this medicine? ALPRAZOLAM (al PRAY zoe lam) is a benzodiazepine. It is used to treat anxiety and panic attacks. This medicine may be used for other purposes; ask your health care provider or pharmacist if you have questions. COMMON BRAND NAME(S): Xanax What should I tell my health care provider before I take this medicine? They need to know if you have any of these conditions: -an alcohol or drug abuse problem -bipolar disorder, depression, psychosis or other mental health conditions -glaucoma -kidney or liver disease -lung or breathing disease -myasthenia gravis -Parkinson's disease -porphyria -seizures or a history of seizures -suicidal thoughts -an unusual or allergic reaction to alprazolam, other benzodiazepines, foods, dyes, or preservatives -pregnant or trying to get pregnant -breast-feeding How should I use this medicine? Take this medicine by mouth with a glass of water. Follow the directions on the prescription label. Take your medicine at regular intervals. Do not take it more often than directed. If you have been taking this medicine regularly for some time, do not suddenly stop taking it. You must gradually reduce the dose or you may get severe side effects. Ask your doctor or health care professional for advice. Even after you stop taking this medicine it can still affect your body for several days. Talk to your pediatrician regarding the use of this medicine in children. Special care may be needed. Overdosage: If you think you have taken too much of this medicine contact  a poison control center or emergency room at once. NOTE: This medicine is only for you. Do not share this medicine with others. What if I miss a dose? If you miss a dose, take it as soon as you can. If it is almost time for your next dose, take only that dose. Do not take double or extra doses. What may interact with this  medicine? Do not take this medicine with any of the following medications: -certain medicines for HIV infection or AIDS -ketoconazole -itraconazole This medicine may also interact with the following medications: -birth control pills -certain macrolide antibiotics like clarithromycin, erythromycin, troleandomycin -cimetidine -cyclosporine -ergotamine -grapefruit juice -herbal or dietary supplements like kava kava, melatonin, dehydroepiandrosterone, DHEA, St. John's Wort or valerian -imatinib, STI-571 -isoniazid -levodopa -medicines for depression, anxiety, or psychotic disturbances -prescription pain medicines -rifampin, rifapentine, or rifabutin -some medicines for blood pressure or heart problems -some medicines for seizures like carbamazepine, oxcarbazepine, phenobarbital, phenytoin, primidone This list may not describe all possible interactions. Give your health care provider a list of all the medicines, herbs, non-prescription drugs, or dietary supplements you use. Also tell them if you smoke, drink alcohol, or use illegal drugs. Some items may interact with your medicine. What should I watch for while using this medicine? Visit your doctor or health care professional for regular checks on your progress. Your body can become dependent on this medicine. Ask your doctor or health care professional if you still need to take it. You may get drowsy or dizzy. Do not drive, use machinery, or do anything that needs mental alertness until you know how this medicine affects you. To reduce the risk of dizzy and fainting spells, do not stand or sit up quickly, especially if you are an older patient. Alcohol may increase dizziness and drowsiness. Avoid alcoholic drinks. Do not treat yourself for coughs, colds or allergies without asking your doctor or health care professional for advice. Some ingredients can increase possible side effects. What side effects may I notice from receiving this  medicine? Side effects that you should report to your doctor or health care professional as soon as possible: -allergic reactions like skin rash, itching or hives, swelling of the face, lips, or tongue -confusion, forgetfulness -depression -difficulty sleeping -difficulty speaking -feeling faint or lightheaded, falls -mood changes, excitability or aggressive behavior -muscle cramps -trouble passing urine or change in the amount of urine -unusually weak or tired Side effects that usually do not require medical attention (report to your doctor or health care professional if they continue or are bothersome): -change in sex drive or performance -changes in appetite This list may not describe all possible side effects. Call your doctor for medical advice about side effects. You may report side effects to FDA at 1-800-FDA-1088. Where should I keep my medicine? Keep out of the reach of children. This medicine can be abused. Keep your medicine in a safe place to protect it from theft. Do not share this medicine with anyone. Selling or giving away this medicine is dangerous and against the law. Store at room temperature between 20 and 25 degrees C (68 and 77 degrees F). Throw away any unused medicine after the expiration date. NOTE: This sheet is a summary. It may not cover all possible information. If you have questions about this medicine, talk to your doctor, pharmacist, or health care provider.  2015, [employer]vier/Gold Standard. (2007-10-21 10:34:46)  Shoulder Pain The shoulder is the joint that connects your arm to your body. Muscles and band-like tissues  that connect bones to muscles (tendons) hold the joint together. Shoulder pain is felt if an injury or medical problem affects one or more parts of the shoulder. HOME CARE   Put ice on the sore area.  Put ice in a plastic bag.  Place a towel between your skin and the bag.  Leave the ice on for 15-20 minutes, 03-04 times a day for the first  2 days.  Stop using cold packs if they do not help with the pain.  If you were given something to keep your shoulder from moving (sling; shoulder immobilizer), wear it as told. Only take it off to shower or bathe.  Move your arm as little as possible, but keep your hand moving to prevent puffiness (swelling).  Squeeze a soft ball or foam pad as much as possible to help prevent swelling.  Take medicine as told by your doctor. GET HELP IF:  You have progressing new pain in your arm, hand, or fingers.  Your hand or fingers get cold.  Your medicine does not help lessen your pain. GET HELP RIGHT AWAY IF:   Your arm, hand, or fingers are numb or tingling.  Your arm, hand, or fingers are puffy (swollen), painful, or turn white or blue. MAKE SURE YOU:   Understand these instructions.  Will watch your condition.  Will get help right away if you are not doing well or get worse. Document Released: 01/14/2008 Document Revised: 12/12/2013 Document Reviewed: 02/09/2012 [facility] Patient Information 2015 [place], [place]. This information is not intended to replace advice given to you by your health care provider. Make sure you discuss any questions you have with your health care provider.

## 2015-01-11 NOTE — Assessment & Plan Note (Addendum)
BP Readings from Last 3 Encounters:  01/11/15 132/84  11/27/14 139/92  10/26/14 142/89   Lab Results  Component Value Date   NA 139 09/25/2014   K 4.1 09/25/2014   CREATININE 0.94 09/25/2014   Assessment: Blood pressure control: mildly elevated Progress toward BP goal:  deteriorated Comments: improved on repeat, initially diastolic was elevated to 98 possible in setting of anxiety  Plan: Medications:  continue current medications atenolol 50mg  daily and prinzide 20-12.5mg  daily; re-evaluate HR next visit as HR noted to be in 50s with BB as home medication

## 2015-01-11 NOTE — Assessment & Plan Note (Addendum)
+  crepitus on exam of right shoulder region likely secondary to OA. Also endorses occasional numbness of arm when she is laying on that side that improves by shaking her hands. Currently has good grip strength and sensation intact. ROM intact but does report some discomfort with internal and external rotation in shoulder region. Relief of pain with tylenol and advil otc. She thought the pain was secondary to her lipoma.   R shoulder xray today--last one was in 2010; I called the patient after her visit as it appeared she did not get the xray done and she informed that she has to come back in the morning because she said it was too full at the time with people but that she will get it done.  Tylenol prn for now, otc, not to exceed >4g per day Of note, she has been seen by sports medicine in the past for the lipoma, and may need re-referral if pain persistent or worsening Follow up with pcp next week or return to clinic sooner if pain worsening or persistent numbness in extremity or weakness

## 2015-01-11 NOTE — Progress Notes (Signed)
Subjective:   [name] female   DOB: [birth year] 54 y.o.   MRN: [id number]  HPI: Jennifer [name] is a 54 y.o. female with PMH as listed below presenting to opc today for acute visit with complaints of R arm pain and swelling.   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 On chart review, it seems they referred her to general surgery but she does not recall this and appears to not be seen there. She says the mass is getting bigger over the years, she has noted it for at least 3-4 years, painful and irritating and itching noted as well. She would like it removed. She denies any limitations in shoulder ROM but says when she sleeps on that side the arm goes numb and she has to shake her arms to get feeling back. Pain in the shoulder is currently 7/10, constant, and feels sore. Discomfort improves with aleve and tylenol she takes over the counter.   Anxiety--she also is demanding a refill of her xanax today. She says she has been having a lot of anxiety lately and small things set off her anxiety such as when she was on the bus today. She refuses to see [facility] and says xanax works well but she doesn't feel like she can wait until she sees her pcp but they have discussed this before. Her last refill appears to be in feb from the ED for limited supply. She denies any depression and suicidal or homicidal ideation. She does endorse occasional panic attacks.   She would also like to see an eye doctor because she says her eyes have brown/yellow color and she feels like there are tiny rocks in there at times. She denies any vision change and says she can see great and denies eye pain. She also endorses a mild headache at this time that she says comes on occasionally. She denies any n/v/abdominal pain.   Past Medical History  Diagnosis Date  . Fibroids     now s/p hysterectomy  . Left ventricular hypertrophy   . Polysubstance  abuse     IV drug [place], cocaine on occassion, smoking and alcoholism, has been going to AA, not completely sober  . Hypertension   . bipolar   . Depression    Current Outpatient Prescriptions  Medication Sig Dispense Refill  . ALPRAZolam (XANAX) 0.5 MG tablet Take 1 tablet (0.5 mg total) by mouth 3 (three) times daily as needed for anxiety. 15 tablet 0  . atenolol (TENORMIN) 50 MG tablet Take 1 tablet (50 mg total) by mouth daily. 90 tablet 3  . clindamycin (CLEOCIN) 150 MG capsule Take 3 capsules (450 mg total) by mouth 3 (three) times daily. 63 capsule 0  . HYDROcodone-acetaminophen (NORCO/VICODIN) 5-325 MG per tablet Take 1-2 tablets by mouth every 6 hours as needed for pain. 42 tablet 0  . ibuprofen (ADVIL,MOTRIN) 800 MG tablet Take 1 tablet (800 mg total) by mouth 3 (three) times daily. 21 tablet 0  . lisinopril-hydrochlorothiazide (PRINZIDE,ZESTORETIC) 20-12.5 MG per tablet Take 1 tablet by mouth daily. 30 tablet 6  . omeprazole (PRILOSEC OTC) 20 MG tablet Take 1 tablet (20 mg total) by mouth daily. 30 tablet 2  . oxyCODONE-acetaminophen (PERCOCET/ROXICET) 5-325 MG per tablet Take 1 tablet by mouth every 6 (six) hours as needed for severe pain. 10 tablet 0   No current facility-administered medications for this visit.   Family History  Problem Relation Age of Onset  . Hypertension Mother   . Hypertension Sister   . Diabetes Father    History   Social History  . Marital Status: Single    Spouse Name: N/A  . Number of Children: N/A  . Years of Education: N/A   Social History Main Topics  . Smoking status: Current Every Day Smoker -- 0.50 packs/day for 16 years    Types: Cigarettes  . Smokeless tobacco: Never Used     Comment: Wants to quit.  Cutting back - smokes mainly at night.  . Alcohol Use: No     Comment: Occasional drinker  . Drug Use: Yes    Special: Cocaine  . Sexual Activity: Yes    Birth Control/ Protection: Surgical   Other Topics Concern  . Not on file    Social History Narrative   Financial assistance approved for 100% discount at [facility] and has [facility] card per Bonna Gains   07/19/2010         Review of Systems:  Constitutional:  Hot flashes occasionally, anxiety  HEENT:  Denies congestion, trouble swallowing, eye pain or blurry vision. Feels tiny rocks at times and wants to get the color of eyes checked by eye doctor  Respiratory:  Denies SOB  Cardiovascular:  Denies chest pain  Gastrointestinal:  Denies nausea, vomiting, abdominal pain  Genitourinary:  Denies dysuria  Musculoskeletal:  R shoulder pain  Skin:  R shoulder soft mass  Neurological:  Denies syncope, dizziness or feeling light-headed. Headache occasionally, and currently says she is getting one but is mild   Objective:  Physical Exam: Filed Vitals:   01/11/15 0959 01/11/15 1045  BP: 120/98 132/84  Pulse: 51   Temp: 98.5 F (36.9 C)   TempSrc: Oral   Height: 5\' 4"  (1.626 m)   Weight: 228 lb 3.2 oz (103.511 kg)   SpO2: 98%    Vitals reviewed. General: sitting in chair, complaining of right shoulder mass, anxious HEENT: EOMI, brownish scleral pigment on b/l eyes Cardiac: bradycardia Pulm: clear to auscultation bilaterally Abd: soft, obese, nontender, BS present, midline lower abdominal healed surgical scar Ext: R anterior shoulder large approximately 6x5cm soft tissue mass with no surrounding erythema or edema and mild tenderness to palpation and around lateral aspect of mass, +crepitus noted on rotation of right shoulder. ROM of right shoulder intact but does endorse "doesnt feel too good" when palpating shoulder with rotation. Shoulder examined by Dr. Beryle Beams as well. +right middle finger unable to straighten out, mild tenderness to deep palpation of medial portion of right lower extremity below the knee but no mass palpated in area, no erythema or edema, moving all extremities, strength equal in b/l extremities, good grip strength and equal bilaterally,  -tenderness to palpation of lateral side of right leg and left leg, able to raise thighs without difficulty with active motion and against resistance with good and equal strength Neuro: alert and oriented, responding to questions appropriately and following commands  Assessment & Plan:  Discussed with Dr. Beryle Beams

## 2015-01-18 ENCOUNTER — Ambulatory Visit (HOSPITAL_COMMUNITY)
Admission: RE | Admit: 2015-01-18 | Discharge: 2015-01-18 | Disposition: A | Discharge status: Home / Self Care | Payer: No Typology Code available for payment source | Source: Ambulatory Visit | Attending: Internal Medicine | Admitting: Internal Medicine

## 2015-01-18 DIAGNOSIS — M25511 Pain in right shoulder: Secondary | ICD-10-CM | POA: Insufficient documentation

## 2015-02-05 ENCOUNTER — Ambulatory Visit: Payer: No Typology Code available for payment source

## 2015-02-07 ENCOUNTER — Telehealth: Payer: Self-pay | Admitting: *Deleted

## 2015-02-07 NOTE — Telephone Encounter (Signed)
Pt walked in to clinic and states never heard about right shoulder xray. Results  Were no acute abnormality per Dr Dareen Piano. Pt states she has had pain right shoulder for one year. She states she has pain for years under right breast for one week when she has alcohol. Appt made Dr Redmond Pulling 02/13/15 9:15AM. Hilda Blades Leani Myron RN 02/07/15 9:45AM

## 2015-02-13 ENCOUNTER — Ambulatory Visit (INDEPENDENT_AMBULATORY_CARE_PROVIDER_SITE_OTHER): Payer: Self-pay | Admitting: Internal Medicine

## 2015-02-13 ENCOUNTER — Ambulatory Visit: Payer: No Typology Code available for payment source

## 2015-02-13 ENCOUNTER — Encounter: Payer: Self-pay | Admitting: Internal Medicine

## 2015-02-13 VITALS — BP 138/81 | HR 53 | Temp 98.1°F | Ht 64.0 in | Wt 225.4 lb

## 2015-02-13 DIAGNOSIS — R1011 Right upper quadrant pain: Secondary | ICD-10-CM

## 2015-02-13 DIAGNOSIS — M25511 Pain in right shoulder: Secondary | ICD-10-CM

## 2015-02-13 DIAGNOSIS — G4700 Insomnia, unspecified: Secondary | ICD-10-CM

## 2015-02-13 LAB — COMPLETE METABOLIC PANEL WITH GFR
ALT: 24 U/L (ref 0–35)
AST: 16 U/L (ref 0–37)
Albumin: 4.4 g/dL (ref 3.5–5.2)
Alkaline Phosphatase: 52 U/L (ref 39–117)
BUN: 18 mg/dL (ref 6–23)
CHLORIDE: 103 meq/L (ref 96–112)
CO2: 26 mEq/L (ref 19–32)
Calcium: 10.1 mg/dL (ref 8.4–10.5)
Creat: 0.88 mg/dL (ref 0.50–1.10)
GFR, EST AFRICAN AMERICAN: 88 mL/min
GFR, EST NON AFRICAN AMERICAN: 76 mL/min
Glucose, Bld: 78 mg/dL (ref 70–99)
Potassium: 4.2 mEq/L (ref 3.5–5.3)
SODIUM: 140 meq/L (ref 135–145)
Total Bilirubin: 0.4 mg/dL (ref 0.2–1.2)
Total Protein: 7.2 g/dL (ref 6.0–8.3)

## 2015-02-13 LAB — TSH: TSH: 1.177 u[IU]/mL (ref 0.350–4.500)

## 2015-02-13 LAB — CK: Total CK: 107 U/L (ref 7–177)

## 2015-02-13 MED ORDER — DICLOFENAC SODIUM 1 % TD GEL: 2.0000 g | Freq: Four times a day (QID) | TRANSDERMAL | Status: DC | PRN

## 2015-02-13 NOTE — Progress Notes (Signed)
   Subjective:    [name], female    DOB: [birth year], 54 y.o.   MRN: [id number]  HPI Comments: [name] is a 54 year old woman with PMH as below here for follow-up of right shoulder/arm pain and RUQ pain x 1 year.  Please see problem based charting for assessment and plan.     Past Medical History  Diagnosis Date  . Fibroids     now s/p hysterectomy  . Left ventricular hypertrophy   . Polysubstance abuse     IV drug [place], cocaine on occassion, smoking and alcoholism, has been going to AA, not completely sober  . Hypertension   . bipolar   . Depression    Current Outpatient Prescriptions on File Prior to Visit  Medication Sig Dispense Refill  . ALPRAZolam (XANAX) 0.5 MG tablet Take 1 tablet (0.5 mg total) by mouth at bedtime as needed for anxiety. 7 tablet 0  . atenolol (TENORMIN) 50 MG tablet Take 1 tablet (50 mg total) by mouth daily. 90 tablet 3  . clindamycin (CLEOCIN) 150 MG capsule Take 3 capsules (450 mg total) by mouth 3 (three) times daily. (Patient not taking: Reported on 01/11/2015) 63 capsule 0  . lisinopril-hydrochlorothiazide (PRINZIDE,ZESTORETIC) 20-12.5 MG per tablet Take 1 tablet by mouth daily. 30 tablet 6  . omeprazole (PRILOSEC OTC) 20 MG tablet Take 1 tablet (20 mg total) by mouth daily. 30 tablet 2   No current facility-administered medications on file prior to visit.    Review of Systems  Constitutional: Negative for fever and chills.  Musculoskeletal: Positive for arthralgias. Negative for myalgias, joint swelling and neck pain.  Neurological: Negative for weakness and numbness.       Objective:   Physical Exam  Constitutional: She is oriented to person, place, and time. She appears well-developed. No distress.  HENT:  Head: Normocephalic and atraumatic.  Mouth/Throat: Oropharynx is clear and moist. No oropharyngeal exudate.  Eyes: EOM are normal. Pupils are equal, round, and reactive to light.  Neck: Normal range of motion. Neck  supple.  Cardiovascular: Normal rate, regular rhythm and normal heart sounds.  Exam reveals no gallop and no friction rub.   No murmur heard. Pulmonary/Chest: Effort normal and breath sounds normal. No respiratory distress. She has no wheezes. She has no rales.  Abdominal: Soft. Bowel sounds are normal. She exhibits no distension and no mass. There is no tenderness. There is no rebound and no guarding.  Musculoskeletal: Normal range of motion. She exhibits tenderness. She exhibits no edema.  Mild TTP.  Muscle bulk and tone equal B/L; Negative drop arm, painful arc, empty can, Neer's; ROM normal B/L  Neurological: She is alert and oriented to person, place, and time. She displays normal reflexes. No cranial nerve deficit. She exhibits normal muscle tone.  Upper extremity strength and sensation intact and equal  Skin: Skin is warm. No rash noted. She is not diaphoretic.  Psychiatric: She has a normal mood and affect. Her behavior is normal.  Vitals reviewed.         Assessment & Plan:  Please see problem based charting for assessment and plan.

## 2015-02-13 NOTE — Assessment & Plan Note (Addendum)
She requested ambien for insomnia but I am not comfortable prescribing this to the patient since our visit did not focus on sleep issues (acute follow-up visit for shoulder/arm pain) and she has mental health hx (bipolar).  It would be more appropriate for her to return for a visit with PCP dedicated to sleep issues.

## 2015-02-13 NOTE — Assessment & Plan Note (Addendum)
Patient reports RUQ after drinking for over a year.  She says she long hx of EtOH (liquor) since age 54 but has cut back to drinking q3 weeks in recent years.  The day after she drinks she experiences RUQ pain, 6/10 for about 3 days.  She takes OTC pain medications and the pain goes away after 3 days.  She denies change in appetite, N/V, dark/bloody or light stool.  Abdominal exam unremarkable.  She has been evaluated with abdominal US (Feb 2016) which was unrevealing.  She denies known hx of IV drug abuse, liver/GB disease or hepatitis.  CMP in Feb 2016 was normal.  Hep B and C negative in April 2016. - I congratulated her on decreasing EtOH but advise she cut back further since it seems to be provoking this pain. - check CMP today; send for abdominal US if abnormal

## 2015-02-13 NOTE — Patient Instructions (Addendum)
1. Your shoulder xray and exam did not reveal a cause for your pain.  I do think you need to see the general surgeon to have the shoulder cyst removed.  Please try aspercreme or salonpas for your shoulder pain.  You can also continue to use Tylenol and/or ibuprofen with pain but do not take more that recommended on the bottle.  Take medications with food.   2. Please take all medications as prescribed.    3. If you have worsening of your symptoms or new symptoms arise, please call the clinic ([phone]), or go to the ER immediately if symptoms are severe.  Please come back to see your primary care doctor to discuss other issues.

## 2015-02-13 NOTE — Assessment & Plan Note (Addendum)
Continued shoulder/arm pain x 1 year.  She describes constant pain, no burning/numbness/tingling.  No neck pain.  No weakness.  Exam is unremarkable - muscle tone and bulk normal; negative painful arm, drop arm, Neer's, empty can; ROM normal; strength normal, sensation grossly normal, reflexes normal.  Shoulder xray unrevealing.  Tylenol and prn NSAIDs work for the pain.  Unclear etiology but no reason to suspect rotator cuff pathology or nerve impingment based on exam.  Length of time, quality of pain, lack of associated symptoms makes cardiac etiology very unlikely.  No degenerative changes on xray. - Continue conservative care:  - Tylenol or NSAID prn - no more than recommended on bottle, take with food - rx for Voltaren gel; she was advised to try OTC aspercreme or salonpas if she cannot obtain voltaren gel due to cost - check CK, TSH, ESR

## 2015-02-14 LAB — SEDIMENTATION RATE: SED RATE: 1 mm/h (ref 0–30)

## 2015-02-14 NOTE — Progress Notes (Signed)
Internal Medicine Clinic Attending  Case discussed with Dr. Wilson soon after the resident saw the patient.  We reviewed the resident's history and exam and pertinent patient test results.  I agree with the assessment, diagnosis, and plan of care documented in the resident's note.  

## 2015-02-15 ENCOUNTER — Other Ambulatory Visit: Payer: Self-pay | Admitting: Internal Medicine

## 2015-02-20 ENCOUNTER — Other Ambulatory Visit: Payer: Self-pay | Admitting: Internal Medicine

## 2015-02-20 ENCOUNTER — Telehealth: Payer: Self-pay | Admitting: *Deleted

## 2015-02-20 NOTE — Telephone Encounter (Signed)
Call to patient given results of normal labs per order of Dr. Redmond Pulling.  Patient voiced understanding of the plan.  Sander Nephew, RN 02/20/2015 4:09 PM

## 2015-03-01 ENCOUNTER — Other Ambulatory Visit: Payer: Self-pay

## 2015-03-01 DIAGNOSIS — Z1231 Encounter for screening mammogram for malignant neoplasm of breast: Secondary | ICD-10-CM

## 2015-03-07 ENCOUNTER — Other Ambulatory Visit: Payer: Self-pay | Admitting: Internal Medicine

## 2015-03-08 ENCOUNTER — Ambulatory Visit: Payer: Self-pay

## 2015-03-13 ENCOUNTER — Ambulatory Visit
Admission: RE | Admit: 2015-03-13 | Discharge: 2015-03-13 | Disposition: A | Discharge status: Home / Self Care | Payer: No Typology Code available for payment source | Source: Ambulatory Visit

## 2015-03-13 DIAGNOSIS — Z1231 Encounter for screening mammogram for malignant neoplasm of breast: Secondary | ICD-10-CM

## 2015-03-22 ENCOUNTER — Other Ambulatory Visit: Payer: Self-pay | Admitting: Internal Medicine

## 2015-03-22 MED ORDER — LISINOPRIL-HYDROCHLOROTHIAZIDE 20-12.5 MG PO TABS: 1.0000 | ORAL_TABLET | Freq: Every day | ORAL | Status: DC

## 2015-03-22 NOTE — Telephone Encounter (Signed)
As this patient was seen by Dr. Redmond Pulling on 02/13/15 and was normotensive at that time, I will refill this prescription for lisinopril/HCTZ 20/12.5 mg.

## 2015-03-22 NOTE — Telephone Encounter (Signed)
Pt called [facility]

## 2015-03-23 NOTE — Telephone Encounter (Signed)
Rx called into [facility]

## 2015-03-29 ENCOUNTER — Encounter: Payer: Self-pay | Admitting: Internal Medicine

## 2015-03-29 ENCOUNTER — Ambulatory Visit (INDEPENDENT_AMBULATORY_CARE_PROVIDER_SITE_OTHER): Payer: No Typology Code available for payment source | Admitting: Internal Medicine

## 2015-03-29 VITALS — BP 132/92 | HR 56 | Temp 98.5°F | Wt 223.6 lb

## 2015-03-29 DIAGNOSIS — I10 Essential (primary) hypertension: Secondary | ICD-10-CM

## 2015-03-29 DIAGNOSIS — L989 Disorder of the skin and subcutaneous tissue, unspecified: Secondary | ICD-10-CM

## 2015-03-29 DIAGNOSIS — N951 Menopausal and female climacteric states: Secondary | ICD-10-CM

## 2015-03-29 DIAGNOSIS — G4700 Insomnia, unspecified: Secondary | ICD-10-CM

## 2015-03-29 MED ORDER — ZOLPIDEM TARTRATE 5 MG PO TABS: 5.0000 mg | ORAL_TABLET | Freq: Every evening | ORAL | Status: DC | PRN

## 2015-03-29 MED ORDER — HYDROCORTISONE 0.5 % EX CREA: 1.0000 "application " | TOPICAL_CREAM | Freq: Two times a day (BID) | CUTANEOUS | Status: DC

## 2015-03-29 MED ORDER — PAROXETINE HCL 10 MG PO TABS: 10.0000 mg | ORAL_TABLET | Freq: Every day | ORAL | Status: DC

## 2015-03-29 NOTE — Assessment & Plan Note (Signed)
Patient describes having severe hot flashes multiple times a day since having a hysterectomy with her ovaries removed about 6 years ago. She states she feels sometimes she is having a fever. She reports decreased sleep due to the hot flashes as well.  - Will try Paxil 10 mg QHS as this has been shown to help about 50% of women with postmenopausal symptoms. Patient was made aware that this will not resolve symptoms completely, but can cut down on the severity of the symptoms. - Gave Rx for Ambien 5 mg QHS PRN to help with sleep

## 2015-03-29 NOTE — Patient Instructions (Signed)
-   Apply hydrocortisone cream twice daily to the affected area - You can take benadryl to help the itching as well - Start Paxil 10 mg at bedtime. This medicine may help decrease the severity of your hot flashes. - Take Ambien 5 mg at bedtime as needed to help you sleep  General Instructions:   Please bring your medicines with you each time you come to clinic.  Medicines may include prescription medications, over-the-counter medications, herbal remedies, eye drops, vitamins, or other pills.   Progress Toward Treatment Goals:  Treatment Goal 01/11/2015  Blood pressure deteriorated  Stop smoking smoking the same amount    Self Care Goals & Plans:  Self Care Goal 02/13/2015  Manage my medications take my medicines as prescribed; bring my medications to every visit; refill my medications on time  Monitor my health -  Eat healthy foods drink diet soda or water instead of juice or soda; eat more vegetables; eat foods that are low in salt; eat baked foods instead of fried foods; eat fruit for snacks and desserts  Be physically active -  Stop smoking -  Meeting treatment goals -    No flowsheet data found.   Care Management & Community Referrals:  Referral 11/27/2014  Referrals made for care management support none needed  Referrals made to community resources none

## 2015-03-29 NOTE — Progress Notes (Signed)
I saw and evaluated the patient.  I personally confirmed the key portions of Dr. Shela Commons history and exam and reviewed pertinent patient test results.  The assessment, diagnosis, and plan were formulated together and I agree with the documentation in the resident's note.

## 2015-03-29 NOTE — Assessment & Plan Note (Signed)
BP well controlled. Today is 132/92. - Continue Atenolol 50 mg daily and Lisinopril-HCTZ 20-12.5 mg daily

## 2015-03-29 NOTE — Assessment & Plan Note (Signed)
Patient reports a lesion on her left anterior mid-thigh that has been present for the last month. She states the lesion is itchy, but not painful. The lesion has not changed in size. She has tried chlorox on the lesion without any relief. She denies any drainage from the lesion. She denies any bug bites. She also notes she has been itching on her arms and chest, but has no other lesions on her body.   On exam, the lesion is small (2-3 cm) and appears erythematous without any fluctuance. It is likely an allergic reaction given the itching. Does not appear to be an infected hair follicle as there is no head to the lesion. Will try topical hydrocortisone cream.  - Gave Rx for hydrocortisone 0.5% to be applied twice daily - Also recommended benadryl to help with itching

## 2015-03-29 NOTE — Progress Notes (Signed)
   Subjective:    [name], female    DOB: 1960/11/15, 54 y.o.   MRN: [id number]  HPI Jennifer [name] is a 54yo woman with PMHx of HTN, GERD, depression, and polysubstance abuse who presents today for an acute visit.   Please refer to A&P documentation.    Review of Systems General: Reports hot flashes. Denies fever, chills, night sweats, changes in weight, changes in appetite HEENT: Denies headaches, ear pain, changes in vision, rhinorrhea, sore throat CV: Denies CP, palpitations, SOB, orthopnea Pulm: Denies SOB, cough, wheezing GI: Denies abdominal pain, nausea, vomiting, diarrhea, constipation, melena, hematochezia GU: Denies dysuria, hematuria, frequency Msk: Denies muscle cramps, joint pains Neuro: Denies weakness, numbness, tingling Skin: Reports itching. Denies bruising    Objective:   Physical Exam General: alert, sitting up in chair, anxious HEENT: [place]/AT, EOMI, sclera anicteric, mucus membranes moist CV: RRR, no m/g/r Pulm: CTA bilaterally, breaths non-labored Abd: BS+, soft, non-tender Ext: warm, no peripheral edema Neuro: alert and oriented x 3 Skin: There is a small erythematous area on the anterior left mid- thigh. Area is not raised and does not come to a head.     Assessment & Plan:  Please refer to A&P documentation.

## 2015-04-05 ENCOUNTER — Encounter (HOSPITAL_COMMUNITY): Payer: Self-pay | Admitting: Emergency Medicine

## 2015-04-05 ENCOUNTER — Emergency Department (HOSPITAL_COMMUNITY)
Admission: EM | Admit: 2015-04-05 | Discharge: 2015-04-05 | Disposition: A | Discharge status: Home / Self Care | Payer: No Typology Code available for payment source | Attending: Emergency Medicine | Admitting: Emergency Medicine

## 2015-04-05 DIAGNOSIS — Z86018 Personal history of other benign neoplasm: Secondary | ICD-10-CM | POA: Insufficient documentation

## 2015-04-05 DIAGNOSIS — Z792 Long term (current) use of antibiotics: Secondary | ICD-10-CM | POA: Insufficient documentation

## 2015-04-05 DIAGNOSIS — Z79899 Other long term (current) drug therapy: Secondary | ICD-10-CM | POA: Insufficient documentation

## 2015-04-05 DIAGNOSIS — I10 Essential (primary) hypertension: Secondary | ICD-10-CM | POA: Insufficient documentation

## 2015-04-05 DIAGNOSIS — K029 Dental caries, unspecified: Secondary | ICD-10-CM | POA: Insufficient documentation

## 2015-04-05 DIAGNOSIS — Z720 Tobacco use: Secondary | ICD-10-CM | POA: Insufficient documentation

## 2015-04-05 DIAGNOSIS — K088 Other specified disorders of teeth and supporting structures: Secondary | ICD-10-CM | POA: Insufficient documentation

## 2015-04-05 DIAGNOSIS — F319 Bipolar disorder, unspecified: Secondary | ICD-10-CM | POA: Insufficient documentation

## 2015-04-05 DIAGNOSIS — K0889 Other specified disorders of teeth and supporting structures: Secondary | ICD-10-CM

## 2015-04-05 MED ORDER — PENICILLIN V POTASSIUM 500 MG PO TABS: 500.0000 mg | ORAL_TABLET | Freq: Three times a day (TID) | ORAL | Status: AC

## 2015-04-05 MED ORDER — IBUPROFEN 600 MG PO TABS: 600.0000 mg | ORAL_TABLET | Freq: Three times a day (TID) | ORAL | Status: DC | PRN

## 2015-04-05 NOTE — ED Notes (Signed)
Bed: WTR9 Expected date:  Expected time:  Means of arrival:  Comments: EMS- dental pain

## 2015-04-05 NOTE — ED Notes (Signed)
Per pt, right lower dental pain-abscessed tooth-has not been able to have it pulled

## 2015-04-05 NOTE — Discharge Instructions (Signed)

## 2015-04-05 NOTE — ED Notes (Signed)
Discharge instructions given and reviewed with patient.  Patient verbalized understanding to take medications as directed and to follow up with dentist as needed.  Patient discharged home in good condition.

## 2015-04-06 NOTE — ED Provider Notes (Signed)
CSN: [id number]     Arrival date & time 04/05/15  1215 History   First MD Initiated Contact with Patient 04/05/15 1244     Chief Complaint  Patient presents with  . Dental Pain      HPI Patient reports right lower second molar pain.  She states this has been bothering her the past several days.  She's had an intermittent issue for 1-2 yearswith this same tooth.  She denies difficulty breathing or swallowing.  No fevers or chills.  No facial swelling.   Past Medical History  Diagnosis Date  . Fibroids     now s/p hysterectomy  . Left ventricular hypertrophy   . Polysubstance abuse     IV drug [place], cocaine on occassion, smoking and alcoholism, has been going to AA, not completely sober  . Hypertension   . bipolar   . Depression    Past Surgical History  Procedure Laterality Date  . Abdominal hysterectomy      2003  . Exploratory laparotomy      complex pelvic mass 2011  . Bilateral salpingoophorectomy      01/2010  . Dilation and curettage of uterus    . Tonsillectomy     Family History  Problem Relation Age of Onset  . Hypertension Mother   . Hypertension Sister   . Diabetes Father    Social History  Substance Use Topics  . Smoking status: Current Every Day Smoker -- 0.50 packs/day for 16 years    Types: Cigarettes  . Smokeless tobacco: Never Used     Comment: Wants to quit.   . Alcohol Use: No     Comment: Occasional drinker   OB History    Gravida Para Term Preterm AB TAB SAB Ectopic Multiple Living   5 0 0 0 4 0 1 0 0 0      Review of Systems  All other systems reviewed and are negative.     Allergies  Review of patient's allergies indicates no known allergies.  Home Medications   Prior to Admission medications   Medication Sig Start Date End Date Taking? Authorizing Provider  atenolol (TENORMIN) 50 MG tablet Take 1 tablet (50 mg total) by mouth daily. 09/18/14  Yes Rushil Sherrye Payor, MD  lisinopril-hydrochlorothiazide (PRINZIDE,ZESTORETIC) 20-12.5 MG  per tablet Take 1 tablet by mouth daily. 03/22/15  Yes Rushil Sherrye Payor, MD  ibuprofen (ADVIL,MOTRIN) 600 MG tablet Take 1 tablet (600 mg total) by mouth every 8 (eight) hours as needed. 04/05/15   Jola Schmidt, MD  penicillin v potassium (VEETID) 500 MG tablet Take 1 tablet (500 mg total) by mouth 3 (three) times daily. 04/05/15 04/12/15  Jola Schmidt, MD   BP 135/89 mmHg  Pulse 51  Temp(Src) 97.9 F (36.6 C) (Oral)  Resp 18  SpO2 96%  LMP 12/04/2009 Physical Exam  Constitutional: She is oriented to person, place, and time. She appears well-developed and well-nourished.  HENT:  Head: Normocephalic.  Right lower second molar with obvious dental decay.  No gingival swelling or fluctuance.  No right-sided facial swelling.  Tolerating secretions.  Oral airway patent.  Eyes: EOM are normal.  Neck: Normal range of motion.  Pulmonary/Chest: Effort normal.  Abdominal: She exhibits no distension.  Musculoskeletal: Normal range of motion.  Neurological: She is alert and oriented to person, place, and time.  Psychiatric: She has a normal mood and affect.  Nursing note and vitals reviewed.   ED Course  Procedures (including critical care time) Labs Review Labs  Reviewed - No data to display  Imaging Review No results found. I have personally reviewed and evaluated these images and lab results as part of my medical decision-making.   EKG Interpretation None      MDM   Final diagnoses:  Pain, dental    Dental Pain. Home with antibiotics and pain medicine. Recommend dental follow up. No signs of gingival abscess. Tolerating secretions. Airway patent. No sub lingular swelling      Jola Schmidt, MD 04/06/15 [phone]

## 2015-05-17 ENCOUNTER — Inpatient Hospital Stay (HOSPITAL_COMMUNITY)
Admission: AD | Admit: 2015-05-17 | Discharge: 2015-05-17 | Disposition: A | Discharge status: Home / Self Care | Payer: No Typology Code available for payment source | Source: Ambulatory Visit | Attending: Family Medicine | Admitting: Family Medicine

## 2015-05-17 DIAGNOSIS — K047 Periapical abscess without sinus: Secondary | ICD-10-CM

## 2015-05-17 DIAGNOSIS — K0889 Other specified disorders of teeth and supporting structures: Secondary | ICD-10-CM | POA: Insufficient documentation

## 2015-05-17 DIAGNOSIS — K029 Dental caries, unspecified: Secondary | ICD-10-CM | POA: Insufficient documentation

## 2015-05-17 MED ORDER — AMOXICILLIN 500 MG PO CAPS: 500.0000 mg | ORAL_CAPSULE | Freq: Three times a day (TID) | ORAL | Status: DC

## 2015-05-17 MED ORDER — OXYCODONE-ACETAMINOPHEN 5-325 MG PO TABS
2.0000 | ORAL_TABLET | Freq: Once | ORAL | Status: AC
  Administered 2015-05-17: 2 via ORAL
  Filled 2015-05-17: qty 2

## 2015-05-17 MED ORDER — OXYCODONE-ACETAMINOPHEN 5-325 MG PO TABS: 1.0000 | ORAL_TABLET | Freq: Four times a day (QID) | ORAL | Status: DC | PRN

## 2015-05-17 MED ORDER — IBUPROFEN 600 MG PO TABS
600.0000 mg | ORAL_TABLET | Freq: Once | ORAL | Status: AC
  Administered 2015-05-17: 600 mg via ORAL
  Filled 2015-05-17: qty 1

## 2015-05-17 NOTE — MAU Note (Signed)
Pt presents to MAU for toothache. PT states that she needs to have her tooth pulled an needs an antibiotic and pain meds until she can get it pulled.

## 2015-05-17 NOTE — Discharge Instructions (Signed)
Dental Pain Dental pain may be caused by many things, including:  Tooth decay (cavities or caries). Cavities expose the nerve of your tooth to air and hot or cold temperatures. This can cause pain or discomfort.  Abscess or infection. A dental abscess is a collection of infected pus from a bacterial infection in the inner part of the tooth (pulp). It usually occurs at the end of the tooth's root.  Injury.  An unknown reason (idiopathic). Your pain may be mild or severe. It may only occur when:  You are chewing.  You are exposed to hot or cold temperature.  You are eating or drinking sugary foods or beverages, such as soda or candy. Your pain may also be constant. HOME CARE INSTRUCTIONS Watch your dental pain for any changes. The following actions may help to lessen any discomfort that you are feeling:  Take medicines only as directed by your dentist.  If you were prescribed an antibiotic medicine, finish all of it even if you start to feel better.  Keep all follow-up visits as directed by your dentist. This is important.  Do not apply heat to the outside of your face.  Rinse your mouth or gargle with salt water if directed by your dentist. This helps with pain and swelling.  You can make salt water by adding  tsp of salt to 1 cup of warm water.  Apply ice to the painful area of your face:  Put ice in a plastic bag.  Place a towel between your skin and the bag.  Leave the ice on for 20 minutes, 2-3 times per day.  Avoid foods or drinks that cause you pain, such as:  Very hot or very cold foods or drinks.  Sweet or sugary foods or drinks. SEEK MEDICAL CARE IF:  Your pain is not controlled with medicines.  Your symptoms are worse.  You have new symptoms. SEEK IMMEDIATE MEDICAL CARE IF:  You are unable to open your mouth.  You are having trouble breathing or swallowing.  You have a fever.  Your face, neck, or jaw is swollen.   This information is not  intended to replace advice given to you by your health care provider. Make sure you discuss any questions you have with your health care provider.   Document Released: 07/28/2005 Document Revised: 12/12/2014 Document Reviewed: 07/24/2014 Elsevier Interactive Patient Education 2016 [employer].  [facility] Guide (Revised August 2014)   Chronic Pain Problems:   [place] [facility]:  [phone]           Patients need to be referred by their primary care doctor/specialist  Insufficient Money for Medicine:           United Way: call "211"    MAP Program at [place] or [place] [phone]            No Primary Care Doctor:  To locate a primary care doctor that accepts your insurance or provides certain services:           [place]: [phone]           Physician Referral Service: [phone] ask for My [place]  If no insurance, you need to see if you qualify for [facility] orange card, call to set      up appointment for eligibility/enrollment at [phone] or [phone] or visit [place] ([address], [place] and [place])  to meet with a [facility] enrollment specialist.  Agencies that provide inexpensive (sliding fee scale) medical care:       [facility] - Family Medicine at [facility] [phone]     [facility] Adult and [place] - [facility] - [phone]     [place] - [place] for Children - [place] [phone]  [facility] - [place] @ [place] - [phone]-     [phone]  [facility] - [place] @ [place] - [phone]  [facility]: [phone]   Women's Clinic: [phone]    Planned Parenthood: [phone]   [facility] of the [place]    [place] Providers:           [place] (No Family Planning accepted)          [address], [phone], Mon-Fri 9am-5pm          [facility] - [phone]  [place], [address] [place], Mon-Thursday 8am-5pm, Fri 8am-noon  [place] - [place], [address], Mon-Fri 7:30am-4:30pm          [place] - [phone]          [address] [facility] - [phone] [address]          Only accepts [place] [employer] patients after they have their name applied to their card  Self Pay (no insurance) in [facility]:           Sickle Cell Patients:   [place], [phone] [facility]:  [address] [place] [phone]       [facility] and Wellness  [address] [place] [phone]  [facility] Family Practice:  [address], [phone]          [facility] Urgent Care           [place], [phone] [place] for [place], [phone]           [facility] Urgent [place]           [place] [address], [place]        [facility] - [address]           [phone], Mon-Fri 9am-7pm, [place] 9am-1pm          [facility] - Family Medicine @ [facility]          [place], [place]          [facility] - [facility]           [address] [place] [facility] - [facility]  [address], [place] [phone]  [facility]           [address], [place]  Triad Adult and [place]   [place], [phone] [facility] - [facility]  [address], [phone]  Dr. Vista Lawman           [address], [place], [place] [facility]           [address] [phone]          [facility]             [address], [phone]          [facility]           [place], [place], 1st & 3rd Saturday every month, 10am-1pm  OTHERS:  [facility] [employer]  ([facility] Only)  [phone] (Thursday only)  Strategies for finding a Primary Care Provider:  1) Find a Doctor and Pay Out of Pocket  Although you won't have to find out who is covered by your insurance plan, it is a good idea to ask around and get recommendations. You will then need to call the office and see if the doctor you have chosen will accept you as a new patient and what types of options they offer for patients who are self-pay. Some doctors offer discounts or will set up payment plans for their patients who do not have insurance, but you will need to ask so you aren't surprised when you get to your appointment.  2) [place] - To see if you qualify for orange card access to healthcare safety net providers.  Call for appointment for eligibility/enrollment at [phone] or [phone]. (Uninsured, 0-200% FPL, qualifying info)  Applicants for [facility] are first required to see if they are eligible to enroll in the [facility] Marketplace before enrolling in [facility] (and get an exemption if they are not).  GCCN Criteria for acceptance is:  ? Proof of [employer] Marketing exemption - form or documentation  ? Valid photo ID (driver's license, state identification card, passport, home country ID)  ? Proof of [facility] residency (e.g. drivers license,  lease/landlord information, pay stubs with address, utility bill, bank statement, etc.)  ? Proof of income (1040, last year's tax return, W2, 4 current pay stubs, other income proof)  ? Proof of assets (current bank statement + 3 most recent, disability paperwork, life insurance info, tax value on autos, etc.)  3) [place] Department  Not all health departments have doctors that can see patients for sick visits, but many do, so it is worth a call to see if yours does. If you don't know where your local health department is, you can check in your phone book. The CDC also has a tool to help you locate your state's health department, and many state websites also have listings of all of their local health departments.  4) Find a [facility]  If your illness is not likely to be very severe or complicated, you may want to try a walk in clinic. These are popping up all over the country in pharmacies, drugstores, and shopping centers. They're usually staffed by nurse practitioners or physician assistants that have been trained to treat common illnesses and complaints. They're usually fairly quick and inexpensive.  However, if you have serious medical issues or chronic medical problems, these are probably not your best option   STD Testing:           [place], [place] [facility]           [address] [place], phone [phone] or [phone]           Monday - Friday, call for an appointment          [place], [place] [facility]           [address], [place], phone [phone] or [phone]           Monday - Friday, call for an appointment Abuse/Neglect:           [place]: [place]: [phone] (After Hours)  Emergency Shelter:  [facility] [phone]  [place]- [phone]  [place] - [phone]  Youth Focus - Act Together - [phone] (ages 55-17)  [place] @ [employer] - [phone]   Mammograms - Free at [facility] - [place]:           Room at the [place]: [phone]   (Homeless mother with children)          [place]: [phone] (Mothers only)  Youth Focus: [phone] (Pregnant 82-71 years old)  Adopt a Mom [phone]  [facility]   [facility] and [place]  [address], [place] [phone]          [facility]           [address], [place], [place]          Outpatient Carecenter Dept.           [place], [place]           [phone]          [facility]           [phone]          [facility] in [place]           [phone], [place]           [phone]           [phone] (After Hours)  [place] Abuse Resources:           Alcohol and Drug Services: [place] [facility]: [phone]          The  [place]: [phone]   Narcotics Helpline - [phone]          [facility]: [phone]           Residential & Outpatient Substance Abuse Program - [facility] [place]: [phone]  [employer]  [place] and [facility] - [phone] Psychological Services:          [place]: [phone]   Therapeutic Alternatives: [phone]          [place]           [address] [place]: [phone]     (24 Hour)  Mobile Crisis:   [place]:  Radio producer on [place] [phone] [facility] on [place] [phone]  Walk In [place]  ([place] - [phone] or [phone]  [place]. [place] [phone]  [place] [address], [place] [phone]   Dental Assistance:  If unable to pay or uninsured, contact: [facility]. to become qualified for the adult dental clinic. Patient must be enrolled in [facility] (uninsured, 0-200% FPL, qualifying info).  Enroll in [facility] first, then see Primary Care Physician assigned to you, the PCP makes a dental referral. [place] [facility] Access Program will receive referral and contacts patient for appointment.  Patients with Medicaid           [address] [place] (Children up to 45 + Pregnant Women) - [phone]  [place] - Suite [phone]  If unable to pay, or uninsured: contact [place] [phone] in [place] - ([place] only + Pregnant Women), [phone] in [place] only) to become qualified for the adult dental clinic  Must see if eligible to enroll in [place] before enrolling into the [facility] (exemption required) [phone] for an appointment)  [email or web address];   [phone].  If not eligible for ACA, then go by Department of Health and Human Services to see if eligible for orange card.  [address] [place].  Once you get an orange card, you will have a Primary Care home who will then refer you to dental if needed.        Other Scientist, forensic:    [facility] [phone] (ext [phone])   [address]  Dr. Donn Pierini - [phone]   [place] [place]   [address] [facility]           [place], [place], [place], [address]           [phone], Ext. [phone]           2nd and 4th Thursday of the month at 6:30am (Simple extractions only - no wisdom teeth or surgery) First come/First serve -First 10 clients served           [facility] [place], [place] and [place] residents only)          [address] [place], [place], [address]           [phone]                    [facility]           [phone]          [place]         [facility]          [phone]   Transportation Options:  Ambulance - 911 - [phone] per ride Family Member to accompany patient (if stable) - [place] - [phone]  PART - [phone]  Taxi - [phone] - [place] [phone] (Application required)  [facility] - [phone]

## 2015-05-17 NOTE — MAU Provider Note (Signed)
Chief Complaint: Dental Pain   None     SUBJECTIVE HPI: Jennifer [name] is a 54 y.o. [id number] who presents to maternity admissions reporting flare up of tooth pain and pain in her right jaw from infected cavity.  She reports she needs oral surgery and has dental provider but cannot afford surgery.  She denies gyn complaints. She denies vaginal bleeding, vaginal itching/burning, urinary symptoms, h/a, dizziness, n/v, or fever/chills.     Dental Pain  This is a recurrent problem. The current episode started in the past 7 days. The problem occurs constantly. The problem has been gradually worsening. The pain is at a severity of 10/10. The pain is severe. Associated symptoms include facial pain and thermal sensitivity. Pertinent negatives include no difficulty swallowing, fever, oral bleeding or sinus pressure. She has tried acetaminophen and NSAIDs for the symptoms. The treatment provided no relief.    Past Medical History  Diagnosis Date  . Fibroids     now s/p hysterectomy  . Left ventricular hypertrophy   . Polysubstance abuse     IV drug [place], cocaine on occassion, smoking and alcoholism, has been going to AA, not completely sober  . Hypertension   . bipolar   . Depression    Past Surgical History  Procedure Laterality Date  . Abdominal hysterectomy      2003  . Exploratory laparotomy      complex pelvic mass 2011  . Bilateral salpingoophorectomy      01/2010  . Dilation and curettage of uterus    . Tonsillectomy     Social History   Social History  . Marital Status: Single    Spouse Name: N/A  . Number of Children: N/A  . Years of Education: N/A   Occupational History  . Not on file.   Social History Main Topics  . Smoking status: Current Every Day Smoker -- 0.50 packs/day for 16 years    Types: Cigarettes  . Smokeless tobacco: Never Used     Comment: Wants to quit.   . Alcohol Use: No     Comment: Occasional drinker  . Drug Use: No  . Sexual Activity: Not on  file   Other Topics Concern  . Not on file   Social History Narrative   Financial assistance approved for 100% discount at [facility] and has [facility] card per Bonna Gains   07/19/2010         No current facility-administered medications on file prior to encounter.   Current Outpatient Prescriptions on File Prior to Encounter  Medication Sig Dispense Refill  . atenolol (TENORMIN) 50 MG tablet Take 1 tablet (50 mg total) by mouth daily. 90 tablet 3  . ibuprofen (ADVIL,MOTRIN) 600 MG tablet Take 1 tablet (600 mg total) by mouth every 8 (eight) hours as needed. 15 tablet 0  . lisinopril-hydrochlorothiazide (PRINZIDE,ZESTORETIC) 20-12.5 MG per tablet Take 1 tablet by mouth daily. 90 tablet 3   No Known Allergies  ROS:  Review of Systems  Constitutional: Negative for fever, chills and fatigue.  HENT: Negative for sinus pressure.   Eyes: Negative for photophobia.  Respiratory: Negative for shortness of breath.   Cardiovascular: Negative for chest pain.  Gastrointestinal: Negative for nausea, vomiting, diarrhea and constipation.  Genitourinary: Negative for dysuria, frequency, flank pain, vaginal bleeding, vaginal discharge, difficulty urinating, vaginal pain and pelvic pain.  Musculoskeletal: Negative for neck pain.  Neurological: Negative for dizziness, weakness and headaches.  Psychiatric/Behavioral: Negative.      I have reviewed patient's  Past Medical Hx, Surgical Hx, Family Hx, Social Hx, medications and allergies.   Physical Exam  Patient Vitals for the past 24 hrs:  BP Temp Pulse Resp  05/17/15 1151 149/92 mmHg - (!) 56 -  05/17/15 1102 156/99 mmHg 97.6 F (36.4 C) (!) 53 18   Constitutional: Well-developed, well-nourished female in mild distress.  Cardiovascular: normal rate HEENT:  Mouth with significant dental caries and broken teeth in back right molars, erythema and edema of gums in right lower jaw, malodorous breath with exam Respiratory: normal effort GI: Abd soft,  non-tender. Pos BS x 4 MS: Extremities nontender, no edema, normal ROM Neurologic: Alert and oriented x 4.  GU: Neg CVAT.    MAU Management/MDM: Evidence of decay and infection on exam.  Pt has hx substance abuse but does have evidence of infection as source of pain.  Percocet 5/325 x 2 tabs given in MAU with Rx for 6 additional tabs, Rx for Amoxicillin 500 mg TID x 7 days.  Pt given contact information for low cost dental providers and encouraged to follow up.  Pt stable at time of discharge.  ASSESSMENT 1. Infected dental caries   2. Pain due to dental caries     PLAN Discharge home    Medication List    TAKE these medications        amoxicillin 500 MG capsule  Commonly known as:  AMOXIL  Take 1 capsule (500 mg total) by mouth 3 (three) times daily.     atenolol 50 MG tablet  Commonly known as:  TENORMIN  Take 1 tablet (50 mg total) by mouth daily.     ibuprofen 600 MG tablet  Commonly known as:  ADVIL,MOTRIN  Take 1 tablet (600 mg total) by mouth every 8 (eight) hours as needed.     lisinopril-hydrochlorothiazide 20-12.5 MG tablet  Commonly known as:  PRINZIDE,ZESTORETIC  Take 1 tablet by mouth daily.     oxyCODONE-acetaminophen 5-325 MG tablet  Commonly known as:  PERCOCET/ROXICET  Take 1-2 tablets by mouth every 6 (six) hours as needed for severe pain.           Follow-up Information    Please follow up.   Why:  With dental provider--see attached list of low-cost dental providers      Follow up with [facility].   Why:  As needed for gyn emergencies   Contact information:   [address] [phone] [place] Certified Nurse-Midwife 05/17/2015  12:21 PM

## 2015-05-28 ENCOUNTER — Encounter (HOSPITAL_COMMUNITY): Payer: Self-pay | Admitting: *Deleted

## 2015-05-28 ENCOUNTER — Emergency Department (HOSPITAL_COMMUNITY)
Admission: EM | Admit: 2015-05-28 | Discharge: 2015-05-28 | Disposition: A | Discharge status: Home / Self Care | Payer: No Typology Code available for payment source | Attending: Emergency Medicine | Admitting: Emergency Medicine

## 2015-05-28 DIAGNOSIS — Z791 Long term (current) use of non-steroidal anti-inflammatories (NSAID): Secondary | ICD-10-CM | POA: Insufficient documentation

## 2015-05-28 DIAGNOSIS — R05 Cough: Secondary | ICD-10-CM | POA: Insufficient documentation

## 2015-05-28 DIAGNOSIS — I10 Essential (primary) hypertension: Secondary | ICD-10-CM | POA: Insufficient documentation

## 2015-05-28 DIAGNOSIS — Z720 Tobacco use: Secondary | ICD-10-CM | POA: Insufficient documentation

## 2015-05-28 DIAGNOSIS — Z79899 Other long term (current) drug therapy: Secondary | ICD-10-CM | POA: Insufficient documentation

## 2015-05-28 DIAGNOSIS — Z86018 Personal history of other benign neoplasm: Secondary | ICD-10-CM | POA: Insufficient documentation

## 2015-05-28 DIAGNOSIS — Z8659 Personal history of other mental and behavioral disorders: Secondary | ICD-10-CM | POA: Insufficient documentation

## 2015-05-28 DIAGNOSIS — K047 Periapical abscess without sinus: Secondary | ICD-10-CM | POA: Insufficient documentation

## 2015-05-28 DIAGNOSIS — R0981 Nasal congestion: Secondary | ICD-10-CM | POA: Insufficient documentation

## 2015-05-28 MED ORDER — HYDROCODONE-ACETAMINOPHEN 5-325 MG PO TABS
1.0000 | ORAL_TABLET | Freq: Once | ORAL | Status: AC
  Administered 2015-05-28: 1 via ORAL
  Filled 2015-05-28: qty 1

## 2015-05-28 MED ORDER — NAPROXEN 375 MG PO TABS: 375.0000 mg | ORAL_TABLET | Freq: Two times a day (BID) | ORAL | Status: DC

## 2015-05-28 MED ORDER — LISINOPRIL 20 MG PO TABS
20.0000 mg | ORAL_TABLET | Freq: Once | ORAL | Status: AC
  Administered 2015-05-28: 20 mg via ORAL
  Filled 2015-05-28: qty 1

## 2015-05-28 MED ORDER — CLINDAMYCIN HCL 150 MG PO CAPS: 150.0000 mg | ORAL_CAPSULE | Freq: Four times a day (QID) | ORAL | Status: DC

## 2015-05-28 MED ORDER — HYDROCHLOROTHIAZIDE 25 MG PO TABS
12.5000 mg | ORAL_TABLET | Freq: Once | ORAL | Status: AC
  Administered 2015-05-28: 12.5 mg via ORAL
  Filled 2015-05-28: qty 1

## 2015-05-28 MED ORDER — LIDOCAINE-EPINEPHRINE (PF) 2 %-1:200000 IJ SOLN
10.0000 mL | Freq: Once | INTRAMUSCULAR | Status: AC
  Administered 2015-05-28: 10 mL via INTRADERMAL
  Filled 2015-05-28: qty 20

## 2015-05-28 MED ORDER — KETOROLAC TROMETHAMINE 60 MG/2ML IM SOLN
60.0000 mg | Freq: Once | INTRAMUSCULAR | Status: AC
  Administered 2015-05-28: 60 mg via INTRAMUSCULAR
  Filled 2015-05-28: qty 2

## 2015-05-28 NOTE — ED Notes (Signed)
Pt reports right side dental abscess that she has been treated with antibiotics for. Still having cough at night, feels like abscess is effecting her throat and airway. Coughed up green pus last night. Airway intact.

## 2015-05-28 NOTE — Discharge Instructions (Signed)
If you were given medicines take as directed.  If you are on coumadin or contraceptives realize their levels and effectiveness is altered by many different medicines.  If you have any reaction (rash, tongues swelling, other) to the medicines stop taking and see a physician.    If your blood pressure was elevated in the ER make sure you follow up for management with a primary doctor or return for chest pain, shortness of breath or stroke symptoms.  Please follow up as directed and return to the ER or see a physician for new or worsening symptoms.  Thank you. Filed Vitals:   05/28/15 0933 05/28/15 0945 05/28/15 1000  BP: 145/93 146/97 164/88  Pulse: 61 63 65  Temp: 98.5 F (36.9 C)    TempSrc: Oral    Resp: 18    Height: 5\' 4"  (1.626 m)    Weight: 202 lb (91.627 kg)    SpO2: 96% 94% 95%    Emergency Department Resource Guide 1) Find a Doctor and Pay Out of Pocket Although you won't have to find out who is covered by your insurance plan, it is a good idea to ask around and get recommendations. You will then need to call the office and see if the doctor you have chosen will accept you as a new patient and what types of options they offer for patients who are self-pay. Some doctors offer discounts or will set up payment plans for their patients who do not have insurance, but you will need to ask so you aren't surprised when you get to your appointment.  2) Contact Your Local Health Department Not all health departments have doctors that can see patients for sick visits, but many do, so it is worth a call to see if yours does. If you don't know where your local health department is, you can check in your phone book. The CDC also has a tool to help you locate your state's health department, and many state websites also have listings of all of their local health departments.  3) Find a [facility] If your illness is not likely to be very severe or complicated, you may want to try a walk in  clinic. These are popping up all over the country in pharmacies, drugstores, and shopping centers. They're usually staffed by nurse practitioners or physician assistants that have been trained to treat common illnesses and complaints. They're usually fairly quick and inexpensive. However, if you have serious medical issues or chronic medical problems, these are probably not your best option.  No Primary Care Doctor: - Call Health Connect at  [phone] - they can help you locate a primary care doctor that  accepts your insurance, provides certain services, etc. - Physician Referral Service- [phone]  Chronic Pain Problems: Organization         Address  Phone   Notes  [facility]  [phone] Patients need to be referred by their primary care doctor.   Medication Assistance: Organization         Address  Phone   Notes  [facility] Medication [facility] [address] [place], [place] [address] [phone] --Must be a resident of [facility] -- Must have NO insurance coverage whatsoever (no Medicaid/ Medicare, etc.) -- The pt. MUST have a primary care doctor that directs their care regularly and follows them in the community   [facility]  [phone]   [address]  [phone]  Agencies that provide inexpensive medical care: Organization         Address  Phone   Notes  [place]  [phone]   [facility]    [phone]   [facility] [place], [place] [address] [phone]   [facility] [address], [place] [phone]   Planned Parenthood    [phone]   [facility]    [phone]   [place] and [place] [address], [place] Phone:  [phone], Fax:  [phone] Hours of Operation:  9 am - 6 pm, M-F.  Also accepts Medicaid/Medicare and self-pay.  [facility]  for [place] [address], [address], [place] Phone: [phone], Fax: [phone]. Hours of Operation:  8:30 am - 5:30 pm, M-F.  Also accepts Medicaid and self-pay.  [facility] [address] [place] Phone: [phone]   [place], [place], [place] [phone], Ext. [phone] Mondays & Thursdays: 7-9 AM.  First 15 patients are seen on a first come, first serve basis.    [place] Providers:  Organization         Address  Phone   Notes  [facility] [address],  [phone] Also accepts self-pay patients.  [facility] [address], [place]  [phone]   [place], [address], [place] [phone]   [facility] [address] [place] [phone]   Lucianne Lei [address], [place]   [phone] Only accepts [place] Access [place] patients after they have their name applied to their card.   Self-Pay (no insurance) in [facility]:  Organization         Address  Phone   Notes  Sickle Cell Patients, [facility] [place] [phone]   [facility] [place] [phone]   [facility] [place]  [place], [place], [place] [phone]   [facility]/Dr. Osei-Bonsu  [address] [place] or [address], [place] [phone] Phone number for both [place] and [place] locations is the same.  [facility] [address], [place] [phone]   [facility] [address], [place] or [address] [phone]   [facility] [address] [place] [phone], phone; [phone], fax Sees patients  1st and 3rd Saturday of every month.  Must not qualify for public or private insurance (i.e. Medicaid, Medicare, [place] Health Choice, Veterans' Benefits)  Household income should be no more than 200% of the poverty level The clinic cannot treat you if you are pregnant or think you are pregnant  Sexually transmitted diseases are not treated at the clinic.    Dental Care: Organization         Address  Phone  Notes  [facility] [place] [phone] Accepts children up to age 69 who are enrolled in [place] or [place]; pregnant women with a Medicaid card; and children who have applied for Medicaid or  [place] Health Choice, but were declined, whose parents can pay a reduced fee at time of service.  [facility] Department of [facility]  [address], [place] [phone] Accepts children up to age 43 who are enrolled in [place] or [place]; pregnant women with a Medicaid card; and children who have applied for Medicaid or [place] Health Choice, but were declined, whose parents can pay a reduced fee at time of service.  [place] Adult Dental Access PROGRAM  [place] [phone] Patients are seen by appointment only. Walk-ins are not accepted. [place] will see patients 64 years of age and older. Monday - Tuesday (8am-5pm) Most Wednesdays (8:30-5pm) $30 per visit, cash only  [facility] Adult Dental Access PROGRAM  [address], [facility] [phone] Patients are seen by appointment only. Walk-ins are not accepted. [place] will see patients 86 years of age and older. One Wednesday Evening (Monthly: Volunteer Based).  $30 per visit, cash only  [place]  [phone] for adults; Children under age 21, call Graduate Pediatric Dentistry at [phone]. Children aged 87-14, please call [phone] to request a  pediatric application.  Dental services are provided in all areas of dental care including fillings, crowns and bridges, complete and partial dentures, implants, gum treatment, root canals, and extractions. Preventive care is also provided. Treatment is provided to both adults and children. Patients are selected via a lottery and there is often a waiting list.   [facility] [address] [place]  [phone] [email or web address]   [facility] [address] [place], [place] [phone], Ext. [phone] Second and Fourth Thursday of each month, opens at 6:30 AM; Clinic ends at 9 AM.  Patients are seen on a first-come first-served basis, and a limited number are seen during each clinic.   [facility]  [address] [place] [place], [place] [phone]   Eligibility Requirements You must have lived in [place], [place], or [place] counties for at least the last three months.   You cannot be eligible for state or federal sponsored [employer], including [employer], [place], or [employer].   You generally cannot be eligible for healthcare insurance through your employer.    How to apply: Eligibility screenings are held every Tuesday and Wednesday afternoon from 1:00 pm until 4:00 pm. You do not need an appointment for the interview!  [facility] [address] [place], [place]   [place]  [place]  [place]  [phone]

## 2015-05-28 NOTE — ED Provider Notes (Addendum)
CSN: [id number]     Arrival date & time 05/28/15  4481 History   First MD Initiated Contact with Patient 05/28/15 [phone]     Chief Complaint  Patient presents with  . Abscess     (Consider location/radiation/quality/duration/timing/severity/associated sxs/prior Treatment) HPI Comments: 54 year old female with history of high blood pressure, dental pain, drug abuse, reflux, depression presents with worsening right upper dental pain for the past week. Patient completed amoxicillin and has been trying to find a dentist. Patient missed appointments at the last dentist and is now required findings 1. No fevers or chills. Mild productive cough. Patient tolerating liquid. Pain fairly constant ache.  Patient is a 54 y.o. female presenting with abscess. The history is provided by the patient.  Abscess Associated symptoms: no fever, no headaches and no vomiting     Past Medical History  Diagnosis Date  . Fibroids     now s/p hysterectomy  . Left ventricular hypertrophy   . Polysubstance abuse     IV drug [place], cocaine on occassion, smoking and alcoholism, has been going to AA, not completely sober  . Hypertension   . bipolar   . Depression    Past Surgical History  Procedure Laterality Date  . Abdominal hysterectomy      2003  . Exploratory laparotomy      complex pelvic mass 2011  . Bilateral salpingoophorectomy      01/2010  . Dilation and curettage of uterus    . Tonsillectomy     Family History  Problem Relation Age of Onset  . Hypertension Mother   . Hypertension Sister   . Diabetes Father    Social History  Substance Use Topics  . Smoking status: Current Every Day Smoker -- 0.50 packs/day for 16 years    Types: Cigarettes  . Smokeless tobacco: Never Used     Comment: Wants to quit.   . Alcohol Use: No     Comment: Occasional drinker   OB History    Gravida Para Term Preterm AB TAB SAB Ectopic Multiple Living   5 0 0 0 4 0 1 0 0 0      Review of Systems   Constitutional: Negative for fever and chills.  HENT: Positive for congestion and dental problem.   Respiratory: Positive for cough. Negative for shortness of breath.   Cardiovascular: Negative for chest pain.  Gastrointestinal: Negative for vomiting and abdominal pain.  Musculoskeletal: Negative for back pain, neck pain and neck stiffness.  Skin: Negative for rash.  Neurological: Negative for headaches.      Allergies  Review of patient's allergies indicates no known allergies.  Home Medications   Prior to Admission medications   Medication Sig Start Date End Date Taking? Authorizing Provider  atenolol (TENORMIN) 50 MG tablet Take 1 tablet (50 mg total) by mouth daily. 09/18/14  Yes Rushil Sherrye Payor, MD  lisinopril-hydrochlorothiazide (PRINZIDE,ZESTORETIC) 20-12.5 MG per tablet Take 1 tablet by mouth daily. 03/22/15  Yes Rushil Sherrye Payor, MD  clindamycin (CLEOCIN) 150 MG capsule Take 1 capsule (150 mg total) by mouth every 6 (six) hours. 05/28/15   Elnora Morrison, MD  ibuprofen (ADVIL,MOTRIN) 600 MG tablet Take 1 tablet (600 mg total) by mouth every 8 (eight) hours as needed. Patient not taking: Reported on 05/28/2015 04/05/15   Jola Schmidt, MD  naproxen (NAPROSYN) 375 MG tablet Take 1 tablet (375 mg total) by mouth 2 (two) times daily. 05/28/15   Elnora Morrison, MD  oxyCODONE-acetaminophen (PERCOCET/ROXICET) 5-325 MG tablet Take 1-2  tablets by mouth every 6 (six) hours as needed for severe pain. Patient not taking: Reported on 05/28/2015 05/17/15   Kathie Dike Leftwich-Kirby, CNM   BP 157/106 mmHg  Pulse 60  Temp(Src) 98.9 F (37.2 C) (Oral)  Resp 16  Ht 5\' 4"  (1.626 m)  Wt 202 lb (91.627 kg)  BMI 34.66 kg/m2  SpO2 97%  LMP 12/04/2009 Physical Exam  Constitutional: She appears well-developed and well-nourished. No distress.  HENT:  Head: Normocephalic and atraumatic.  Patient has tenderness and mild fluctuance right upper gingiva above first molar, very poor dentition, no trismus,  moist membranes, no posterior swelling, no stridor neck supple.  Eyes: Conjunctivae are normal.  Neck: Normal range of motion. Neck supple.  Cardiovascular: Normal rate.   Nursing note and vitals reviewed.   ED Course  Procedures (including critical care time) EMERGENCY DEPARTMENT US SOFT TISSUE INTERPRETATION "Study: Limited Soft Tissue Ultrasound"  INDICATIONS: Pain and Soft tissue infection Multiple views of the body part were obtained in real-time with a multi-frequency linear probe PERFORMED BY:  Myself IMAGES ARCHIVED?: Yes SIDE:Right  BODY PART:Other soft tisse (comment in note) FINDINGS: Abcess present INTERPRETATION:  Abcess present small fluid collection .5 cm   CPT: Neck [id number]  Upper extremity 16109-60  Axilla 45409-81  Chest wall 19147-82  Beast 95621-30  Upper back 86578-46  Lower back 96295-28  Abdominal wall 41324-40  Pelvic wall 10272-53  Lower extremity 66440-34  Other soft tissue 74259-56  INCISION AND DRAINAGE Performed by: Mariea Clonts and MS IV Consent: Verbal consent obtained. Risks and benefits: risks, benefits and alternatives were discussed Type: abscess  Body area: right upper dental Anesthesia: local infiltration Incision was made with a scalpel. Local anesthetic: lidocaine Anesthetic total: 3 ml Complexity: simple  Drainage: minimal  Patient tolerance: Patient tolerated the procedure well with no immediate complications.    Labs Review Labs Reviewed - No data to display  Imaging Review No results found. I have personally reviewed and evaluated these images and lab results as part of my medical decision-making.   EKG Interpretation None      MDM   Final diagnoses:  Dental abscess  Essential hypertension   Concern for dental abscess. Plan for ultrasound which showed small abscess, incision and drainage, clindamycin and outpatient follow up with a dentist. No signs of severe infection at this  time.  Patient's blood pressure elevated likely combination of pain and patient is out of her blood pressure medications. Gave her normal dose and patient has outpatient follow-up for refill. Results and differential diagnosis were discussed with the patient/parent/guardian. Xrays were independently reviewed by myself.  Close follow up outpatient was discussed, comfortable with the plan.   Medications  lisinopril (PRINIVIL,ZESTRIL) tablet 20 mg (not administered)  hydrochlorothiazide (HYDRODIURIL) tablet 12.5 mg (not administered)  ketorolac (TORADOL) injection 60 mg (60 mg Intramuscular Given 05/28/15 1102)  HYDROcodone-acetaminophen (NORCO/VICODIN) 5-325 MG per tablet 1 tablet (1 tablet Oral Given 05/28/15 1102)  lidocaine-EPINEPHrine (XYLOCAINE W/EPI) 2 %-1:200000 (PF) injection 10 mL (10 mLs Intradermal Given by Other 05/28/15 1151)  HYDROcodone-acetaminophen (NORCO/VICODIN) 5-325 MG per tablet 1 tablet (1 tablet Oral Given 05/28/15 1158)    Filed Vitals:   05/28/15 0945 05/28/15 1000 05/28/15 1115 05/28/15 1153  BP: 146/97 164/88 158/108 157/106  Pulse: 63 65  60  Temp:    98.9 F (37.2 C)  TempSrc:    Oral  Resp:    16  Height:      Weight:  SpO2: 94% 95%  97%    Final diagnoses:  Dental abscess  Essential hypertension      Elnora Morrison, MD 05/28/15 1153  Elnora Morrison, MD 05/28/15 1202

## 2015-07-25 ENCOUNTER — Other Ambulatory Visit: Payer: Self-pay | Admitting: Internal Medicine

## 2015-07-25 NOTE — Telephone Encounter (Signed)
Pt requesting the nurse to call back. 

## 2015-07-26 MED ORDER — PAROXETINE HCL 20 MG PO TABS: 10.0000 mg | ORAL_TABLET | Freq: Every day | ORAL | Status: DC

## 2015-07-26 NOTE — Telephone Encounter (Signed)
Spoke with pt, she is requesting a refill on paxil which is no longer on her med list. Request sent to PCP

## 2015-07-26 NOTE — Telephone Encounter (Signed)
rx called in

## 2015-07-26 NOTE — Telephone Encounter (Signed)
Attempt to call patient on number provided, got message that states person cannot receive calls at this time and line disconnected.  Will try again later.

## 2015-07-26 NOTE — Telephone Encounter (Signed)
As this patient was seen by me on 03/29/15 and deemed necessary for their treatment as documented in the EHR, I will refill this prescription for Paxil 10 mg at bedtime.  To assess how she is doing on this medication, she should have a follow-up visit which would put her at late January/early February. I will include these instructions the pharmacy refill information.

## 2015-07-26 NOTE — Telephone Encounter (Signed)
Same message as before.

## 2015-07-30 ENCOUNTER — Ambulatory Visit: Payer: No Typology Code available for payment source | Admitting: Internal Medicine

## 2015-07-30 ENCOUNTER — Encounter: Payer: Self-pay | Admitting: Internal Medicine

## 2015-08-15 ENCOUNTER — Emergency Department (HOSPITAL_COMMUNITY)
Admission: EM | Admit: 2015-08-15 | Discharge: 2015-08-15 | Disposition: A | Discharge status: Home / Self Care | Payer: No Typology Code available for payment source | Attending: Emergency Medicine | Admitting: Emergency Medicine

## 2015-08-15 ENCOUNTER — Encounter (HOSPITAL_COMMUNITY): Payer: Self-pay | Admitting: Emergency Medicine

## 2015-08-15 DIAGNOSIS — F329 Major depressive disorder, single episode, unspecified: Secondary | ICD-10-CM | POA: Insufficient documentation

## 2015-08-15 DIAGNOSIS — I10 Essential (primary) hypertension: Secondary | ICD-10-CM | POA: Insufficient documentation

## 2015-08-15 DIAGNOSIS — Z86018 Personal history of other benign neoplasm: Secondary | ICD-10-CM | POA: Insufficient documentation

## 2015-08-15 DIAGNOSIS — K0889 Other specified disorders of teeth and supporting structures: Secondary | ICD-10-CM

## 2015-08-15 DIAGNOSIS — F17210 Nicotine dependence, cigarettes, uncomplicated: Secondary | ICD-10-CM | POA: Insufficient documentation

## 2015-08-15 DIAGNOSIS — Z79899 Other long term (current) drug therapy: Secondary | ICD-10-CM | POA: Insufficient documentation

## 2015-08-15 DIAGNOSIS — K047 Periapical abscess without sinus: Secondary | ICD-10-CM | POA: Insufficient documentation

## 2015-08-15 DIAGNOSIS — K002 Abnormalities of size and form of teeth: Secondary | ICD-10-CM | POA: Insufficient documentation

## 2015-08-15 MED ORDER — PENICILLIN V POTASSIUM 500 MG PO TABS
500.0000 mg | ORAL_TABLET | Freq: Once | ORAL | Status: AC
  Administered 2015-08-15: 500 mg via ORAL
  Filled 2015-08-15: qty 1

## 2015-08-15 MED ORDER — LIDOCAINE-EPINEPHRINE (PF) 2 %-1:200000 IJ SOLN
20.0000 mL | Freq: Once | INTRAMUSCULAR | Status: DC
  Filled 2015-08-15: qty 20

## 2015-08-15 MED ORDER — IBUPROFEN 800 MG PO TABS: 800.0000 mg | ORAL_TABLET | Freq: Three times a day (TID) | ORAL | Status: DC

## 2015-08-15 MED ORDER — PENICILLIN V POTASSIUM 500 MG PO TABS: 500.0000 mg | ORAL_TABLET | Freq: Three times a day (TID) | ORAL | Status: DC

## 2015-08-15 NOTE — ED Provider Notes (Signed)
CSN: [id number]     Arrival date & time 08/15/15  0701 History   First MD Initiated Contact with Patient 08/15/15 [phone]     Chief Complaint  Patient presents with  . Dental Pain     HPI Patient presents with poor dentition and increasing right sided dental pain and right-sided facial swelling over the past 2-3 days.  She does not have a dentist.  She does not have dental insurance.  She's required antibiotics perform the past.  She is currently not on antibiotics.  No fever or chills.  No difficulty breathing or swallowing.  No other complaints.  Pain is moderate in severity.   Past Medical History  Diagnosis Date  . Fibroids     now s/p hysterectomy  . Left ventricular hypertrophy   . Polysubstance abuse     IV drug [place], cocaine on occassion, smoking and alcoholism, has been going to AA, not completely sober  . Hypertension   . bipolar   . Depression    Past Surgical History  Procedure Laterality Date  . Abdominal hysterectomy      2003  . Exploratory laparotomy      complex pelvic mass 2011  . Bilateral salpingoophorectomy      01/2010  . Dilation and curettage of uterus    . Tonsillectomy     Family History  Problem Relation Age of Onset  . Hypertension Mother   . Hypertension Sister   . Diabetes Father    Social History  Substance Use Topics  . Smoking status: Current Every Day Smoker -- 0.50 packs/day for 16 years    Types: Cigarettes  . Smokeless tobacco: Never Used     Comment: Wants to quit.   . Alcohol Use: No     Comment: Occasional drinker   OB History    Gravida Para Term Preterm AB TAB SAB Ectopic Multiple Living   5 0 0 0 4 0 1 0 0 0      Review of Systems  All other systems reviewed and are negative.     Allergies  Review of patient's allergies indicates no known allergies.  Home Medications   Prior to Admission medications   Medication Sig Start Date End Date Taking? Authorizing Provider  atenolol (TENORMIN) 50 MG tablet Take 1 tablet (50  mg total) by mouth daily. 09/18/14   Rushil Sherrye Payor, MD  ibuprofen (ADVIL,MOTRIN) 800 MG tablet Take 1 tablet (800 mg total) by mouth 3 (three) times daily. 08/15/15   Jola Schmidt, MD  lisinopril-hydrochlorothiazide (PRINZIDE,ZESTORETIC) 20-12.5 MG per tablet Take 1 tablet by mouth daily. 03/22/15   Rushil Sherrye Payor, MD  PARoxetine (PAXIL) 20 MG tablet Take 0.5 tablets (10 mg total) by mouth daily. 07/26/15 07/25/16  Riccardo Dubin, MD  penicillin v potassium (VEETID) 500 MG tablet Take 1 tablet (500 mg total) by mouth 3 (three) times daily. 08/15/15   Jola Schmidt, MD   BP 157/100 mmHg  Pulse 61  Temp(Src) 98.8 F (37.1 C) (Oral)  Resp 20  SpO2 98%  LMP 12/04/2009 Physical Exam  Constitutional: She is oriented to person, place, and time. She appears well-developed and well-nourished.  HENT:  Head: Normocephalic.  Facial swelling on the right. Poor dentition throughout. Swelling near the right lower 2nd molar with fluctuance  Eyes: EOM are normal.  Neck: Normal range of motion.  Pulmonary/Chest: Effort normal.  Abdominal: She exhibits no distension.  Musculoskeletal: Normal range of motion.  Neurological: She is alert and oriented to  person, place, and time.  Psychiatric: She has a normal mood and affect.  Nursing note and vitals reviewed.   ED Course  Procedures (including critical care time)  INCISION AND DRAINAGE Performed by: Hoy Morn Consent: Verbal consent obtained. Risks and benefits: risks, benefits and alternatives were discussed Time out performed prior to procedure Type: abscess Body area: right lower 2nd molar Anesthesia: local infiltration Incision was made with a scalpel. Local anesthetic: lidocaine 2% with epinephrine Anesthetic total: 5 ml Complexity: complex Blunt dissection to break up loculations Drainage: purulent Drainage amount: small Packing material: none Patient tolerance: Patient tolerated the procedure well with no immediate  complications.  DENTAL NERVE BLOCK Performed by: Hoy Morn Consent: Verbal consent obtained. Required items: required blood products, implants, devices, and special equipment available Time out: Immediately prior to procedure a "time out" was called to verify the correct patient, procedure, equipment, support staff and site/side marked as required. Indication: dental abscess/pain Nerve block body site: right lower 2nd molar Preparation: Patient was prepped and draped in the usual sterile fashion. Needle gauge: 24 G Location technique: anatomical landmarks Local anesthetic: lidocaine 2% with epi Anesthetic total: 4 ml Outcome: pain improved Patient tolerance: Patient tolerated the procedure well with no immediate complications.     Labs Review Labs Reviewed - No data to display  Imaging Review No results found. I have personally reviewed and evaluated these images and lab results as part of my medical decision-making.   EKG Interpretation None      MDM   Final diagnoses:  Pain, dental  Dental abscess    Home with abx. I and D at gingival level. Understands to return to ER for new or worsening symptoms    Jola Schmidt, MD 08/15/15 [phone]

## 2015-08-15 NOTE — ED Notes (Signed)
Pt states that she needs a cab voucher.  Pt informed that we no longer do cab vouchers but I could possibly get her a bus ticket.  Pt stated that she could not take the bus due to her dental pain.  Pt given a bus ticket, a coffee, and was sent on her way.

## 2015-08-15 NOTE — ED Notes (Signed)
Pt is c/o dental pain on the right side  Pt states the whole side of her head hurts  Pt states it started a couple days ago and has progressively gotten worse  Pt states she has tried to gargle with salt water but it has not helped

## 2015-08-15 NOTE — Discharge Instructions (Signed)
Dental Abscess °A dental abscess is a collection of pus in or around a tooth. °CAUSES °This condition is caused by a bacterial infection around the root of the tooth that involves the inner part of the tooth (pulp). It may result from: °· Severe tooth decay. °· Trauma to the tooth that allows bacteria to enter into the pulp, such as a broken or chipped tooth. °· Severe gum disease around a tooth. °SYMPTOMS °Symptoms of this condition include: °· Severe pain in and around the infected tooth. °· Swelling and redness around the infected tooth, in the mouth, or in the face. °· Tenderness. °· Pus drainage. °· Bad breath. °· Bitter taste in the mouth. °· Difficulty swallowing. °· Difficulty opening the mouth. °· Nausea. °· Vomiting. °· Chills. °· Swollen neck glands. °· Fever. °DIAGNOSIS °This condition is diagnosed with examination of the infected tooth. During the exam, your dentist may tap on the infected tooth. Your dentist will also ask about your medical and dental history and may order X-rays. °TREATMENT °This condition is treated by eliminating the infection. This may be done with: °· Antibiotic medicine. °· A root canal. This may be performed to save the tooth. °· Pulling (extracting) the tooth. This may also involve draining the abscess. This is done if the tooth cannot be saved. °HOME CARE INSTRUCTIONS °· Take medicines only as directed by your dentist. °· If you were prescribed antibiotic medicine, finish all of it even if you start to feel better. °· Rinse your mouth (gargle) often with salt water to relieve pain or swelling. °· Do not drive or operate heavy machinery while taking pain medicine. °· Do not apply heat to the outside of your mouth. °· Keep all follow-up visits as directed by your dentist. This is important. °SEEK MEDICAL CARE IF: °· Your pain is worse and is not helped by medicine. °SEEK IMMEDIATE MEDICAL CARE IF: °· You have a fever or chills. °· Your symptoms suddenly get worse. °· You have a  very bad headache. °· You have problems breathing or swallowing. °· You have trouble opening your mouth. °· You have swelling in your neck or around your eye. °  °This information is not intended to replace advice given to you by your health care provider. Make sure you discuss any questions you have with your health care provider. °  °Document Released: 07/28/2005 Document Revised: 12/12/2014 Document Reviewed: 07/25/2014 °Elsevier Interactive Patient Education ©2016 Elsevier Inc. ° °Dental Care and Dentist Visits °Dental care supports good overall health. Regular dental visits can also help you avoid dental pain, bleeding, infection, and other more serious health problems in the future. It is important to keep the mouth healthy because diseases in the teeth, gums, and other oral tissues can spread to other areas of the body. Some problems, such as diabetes, heart disease, and pre-term labor have been associated with poor oral health.  °See your dentist every 6 months. If you experience emergency problems such as a toothache or broken tooth, go to the dentist right away. If you see your dentist regularly, you may catch problems early. It is easier to be treated for problems in the early stages.  °WHAT TO EXPECT AT A DENTIST VISIT  °Your dentist will look for many common oral health problems and recommend proper treatment. At your regular dental visit, you can expect: °· Gentle cleaning of the teeth and gums. This includes scraping and polishing. This helps to remove the sticky substance around the teeth and gums (  plaque). Plaque forms in the mouth shortly after eating. Over time, plaque hardens on the teeth as tartar. If tartar is not removed regularly, it can cause problems. Cleaning also helps remove stains.  Periodic X-rays. These pictures of the teeth and supporting bone will help your dentist assess the health of your teeth.  Periodic fluoride treatments. Fluoride is a natural mineral shown to help  strengthen teeth. Fluoride treatmentinvolves applying a fluoride gel or varnish to the teeth. It is most commonly done in children.  Examination of the mouth, tongue, jaws, teeth, and gums to look for any oral health problems, such as:  Cavities (dental caries). This is decay on the tooth caused by plaque, sugar, and acid in the mouth. It is best to catch a cavity when it is small.  Inflammation of the gums caused by plaque buildup (gingivitis).  Problems with the mouth or malformed or misaligned teeth.  Oral cancer or other diseases of the soft tissues or jaws. KEEP YOUR TEETH AND GUMS HEALTHY For healthy teeth and gums, follow these general guidelines as well as your dentist's specific advice:  Have your teeth professionally cleaned at the dentist every 6 months.  Brush twice daily with a fluoride toothpaste.  Floss your teeth daily.  Ask your dentist if you need fluoride supplements, treatments, or fluoride toothpaste.  Eat a healthy diet. Reduce foods and drinks with added sugar.  Avoid smoking. TREATMENT FOR ORAL HEALTH PROBLEMS If you have oral health problems, treatment varies depending on the conditions present in your teeth and gums.  Your caregiver will most likely recommend good oral hygiene at each visit.  For cavities, gingivitis, or other oral health disease, your caregiver will perform a procedure to treat the problem. This is typically done at a separate appointment. Sometimes your caregiver will refer you to another dental specialist for specific tooth problems or for surgery. SEEK IMMEDIATE DENTAL CARE IF:  You have pain, bleeding, or soreness in the gum, tooth, jaw, or mouth area.  A permanent tooth becomes loose or separated from the gum socket.  You experience a blow or injury to the mouth or jaw area.   This information is not intended to replace advice given to you by your health care provider. Make sure you discuss any questions you have with your  health care provider.   Document Released: 04/09/2011 Document Revised: 10/20/2011 Document Reviewed: 04/09/2011 Elsevier Interactive Patient Education 2016 [employer]. Emergency Department Resource Guide 1) Find a Doctor and Pay Out of Pocket Although you won't have to find out who is covered by your insurance plan, it is a good idea to ask around and get recommendations. You will then need to call the office and see if the doctor you have chosen will accept you as a new patient and what types of options they offer for patients who are self-pay. Some doctors offer discounts or will set up payment plans for their patients who do not have insurance, but you will need to ask so you aren't surprised when you get to your appointment.  2) Contact Your Local Health Department Not all health departments have doctors that can see patients for sick visits, but many do, so it is worth a call to see if yours does. If you don't know where your local health department is, you can check in your phone book. The CDC also has a tool to help you locate your state's health department, and many state websites also have listings of all of  their local health departments.  3) Find a [facility] If your illness is not likely to be very severe or complicated, you may want to try a walk in clinic. These are popping up all over the country in pharmacies, drugstores, and shopping centers. They're usually staffed by nurse practitioners or physician assistants that have been trained to treat common illnesses and complaints. They're usually fairly quick and inexpensive. However, if you have serious medical issues or chronic medical problems, these are probably not your best option.   Chronic Pain Problems: Organization         Address     Phone             Notes  [facility]  [phone] Patients need to be referred by their primary care doctor.   Medication Assistance: Organization          Address     Phone             Notes  [facility] Medication [facility] [address] [place], [place] [address] [phone] --Must be a resident of [facility] -- Must have NO insurance coverage whatsoever (no Medicaid/ Medicare, etc.) -- The pt. MUST have a primary care doctor that directs their care regularly and follows them in the community   MedAssist  [phone]   [employer]  [phone]    Agencies that provide inexpensive medical care: Organization         Address     Phone             Notes  [place]  [phone]   [facility]    [phone]   [facility] [place], [place] [address] [phone]   [place] [address] [place] [phone]   Planned Parenthood    [phone]   [facility]    [phone]   [place] and [place] [address], [place] Phone:  [phone], Fax:  [phone] Hours of Operation:  9 am - 6 pm, M-F.  Also accepts Medicaid/Medicare and self-pay.  [facility] for [place], [address], [place] Phone: [phone], Fax: [phone]. Hours of Operation:  8:30 am - 5:30 pm, M-F.  Also accepts Medicaid and self-pay.  [facility] [place] [address], [place] Phone: [phone]   [place], [place], [place] [phone], Ext. [phone] Mondays & Thursdays: 7-9 AM.  First 15 patients are seen on a first come, first serve basis.   [facility] [address] [place] [address] [phone] Accepts Medicaid   [place] Providers:  Organization         Address     Phone             Notes  [facility] [address], [place] [phone] Also accepts self-pay patients.  [place], [place]  [phone]   [place], [address], [place] [phone]   [facility] [address], [place] [phone]   Lucianne Lei [address], [place]   [phone] Only accepts [place] Access  Medicaid patients after they have their name applied to their card.   Self-Pay (no insurance) in [facility]:  Organization         Address     Phone             Notes  Sickle Cell Patients, [facility] Internal [facility] [place] [phone]   [facility] [place] [phone]   [facility] [place]  [place], [place], [place] [phone]   [facility]/Dr. Osei-Bonsu  [address] [place] or [address], [place] [phone] Phone number for both [place] and [place] locations is the same.  Urgent Medical and [facility] [address], [place] [phone]   [facility] [address] [place] or [address] [phone]   [facility] [address], [place] [phone], phone; [phone], fax Sees patients 1st and 3rd Saturday of every month.  Must not qualify for public or private insurance (i.e. Medicaid, Medicare, [place] Health Choice, Veterans' Benefits)  Household income should be no more than 200% of the poverty level The clinic cannot treat you if you are pregnant or think you are pregnant  Sexually transmitted diseases are not treated at the clinic.    Dental Care:  Organization         Address     Phone             Notes  [facility] Department of [facility] [place] [phone] Accepts children up to age 12 who are enrolled in [place] or [place]; pregnant women with a Medicaid card; and  children who have applied for Medicaid or [place] Health Choice, but were declined, whose parents can pay a reduced fee at time of service.  [facility] Department of [facility]  [address], [place] [phone] Accepts children up to age 36 who are enrolled in [place] or [place]; pregnant women with a Medicaid card; and children who have applied for Medicaid or [place] Health Choice, but were declined, whose parents can pay a reduced fee at time of service.  [place] Adult Dental Access PROGRAM  [place] [phone] Patients are seen by appointment only. Walk-ins are not accepted. [place] will see patients 78 years of age and older. Monday - Tuesday (8am-5pm) Most Wednesdays (8:30-5pm) $30 per visit, cash only  [facility] Adult Dental Access PROGRAM  [address], [facility] [phone] Patients are seen by appointment only. Walk-ins are not accepted. [place] will see patients 77 years of age and older. One Wednesday Evening (Monthly: Volunteer Based).  $30 per visit, cash only  [place]  [phone] for adults; Children under age 33, call Graduate Pediatric Dentistry at [phone]. Children aged 53-14, please call [phone] to request a pediatric application.  Dental services are provided in all areas of dental care including fillings, crowns and bridges, complete and partial dentures, implants, gum treatment, root canals, and extractions. Preventive care is also provided. Treatment is provided to both adults and children. Patients are selected via a lottery and there is often a waiting list.   [facility] [address], [place]  [phone] [email or web address]  Dental [address] [place], [place] [phone], Ext. [phone] Second and Fourth Thursday of each month, opens at 6:30 AM; Clinic ends at 9 AM.  Patients are seen on a first-come first-served  basis, and a limited number are seen during each clinic.   [facility]  [address] Hillard Danker [place], [place] [phone]   Eligibility Requirements You must have lived in [place], [place], or [place] counties for at least the last three months.   You cannot be eligible for state or federal sponsored [employer], including [employer], [place], or [employer].   You generally cannot be eligible for healthcare insurance through your employer.    How to apply: Eligibility screenings are held every Tuesday and Wednesday afternoon from 1:00 pm until 4:00 pm. You do not need an appointment for the interview!  [facility] [address] [place], [place]   [place]  [place] Department  [place]  [phone]    Behavioral Health Resources in the Community: Intensive Outpatient Programs Organization         Address     Phone             Notes  Reagan Forman. [address] [place], [place] [phone]   [facility] Outpatient [address] [place], [place]   ADS: Alcohol & Drug Svcs [address], [place], [place]   [place] [address]  [place], [place] or [phone]     Substance Abuse Resources Organization         Address     Phone             Notes  Alcohol and Drug Services  [phone]   [place]  [phone]   The [place]   Chinita Pester  [phone]   Residential & Outpatient Substance Abuse Program  [phone]   Psychological Services Organization         Address     Phone             Notes  [facility]  [place]  [phone]   [place] [address], [place] or [phone]    Mobile  Crisis Teams Organization         Address     Phone             Notes  [facility]rapeutic Alternatives, Mobile Crisis Care Unit  [phone]   Assertive Psychotherapeutic Services  [address] [place], [place]   [place] Levels [address] [place] [employer] [phone]    Self-Help/Support Groups Organization         Address     Phone             Notes  [place]. of  - variety of support groups  [place] Call for more information  Narcotics Anonymous (NA), [facility]aring [facility] [address], [employer] [place]  2 meetings at this location   Special educational needs teacher         Address     Phone             Notes  [facility]AP Residential Treatment [place],    [place]  [place]  [address]  [place], [id number], [place], [place]   [place], [place] [phone] Admissions: 8am-3pm M-F  Incentives Substance [place] [address]    [place], [place] [phone]   [facility] [address] [place], [place], [place]   The [facility] [address]  [place], [place]   Insight Programs - Intensive Outpatient [address] Kristeen [address], [place],    [facility] [place].,  [place], [place] [phone] or [phone]   Residential Treatment Services (RTS) [address] [place], [place] Accepts Medicaid  [facility] [address],  [place] [phone] Substance Abuse/Addiction Treatment   [facility] Organization         Address     Phone             Notes  [facility]  [phone]   Domenic Schwab, PhD [address] Arlis Porta [place], [place]   [phone] or [phone]   [place], [place] [phone]   [facility]  Recovery [address] [place], [place] [phone] Insurance/Medicaid/sponsorship through [facility] and Families [address] [place]                                    [place], [place] [phone] [place] [address], [place] [phone]    Dr. Adele Schilder  [phone]   [facility] Dept. 1) [address], [place] 2) [place] 3)  [address] [place] [address], [place] [phone]  [phone]   [place] [phone] or [phone] (After Hours)     [place]: Abuse and Neglect Organization         Address     Phone             Notes  Child/Elder Abuse Hotline  231-844-2758   Family Abuse Services  [phone] 24 hour crisis line  [facility]  [place]  [place] & Substance Use Organization         Address     Phone             Notes  [employer]   [phone] 24 hour crisis line  Advance Access  [address] [place] [phone] Monday- Friday, walk-in,  8am-8pm  RTSA Detoxification & Crisis Stabilization  123XX123   Alcoholics Anonymous (  123XX123 [place] [employer]  Narcotics Anonymous  [place] & Urgent Care Centers Organization         Address     Phone             Notes  [facility] Department  [facility] [place]   [place]   [facility]  [phone] Uninsured patients meeting eligibility requirement  [place]  [place]  [place]   [facility]   [place]  [phone]     Additional [facility] Organization         Address     Phone             Notes  [place] and [place]  [place]  [phone] Medicaid, Nutrition, Medicine Assistance, Utility Assistance  [place] [id number]   [place] [facility]  4151964716   [place]  [phone] [place] of Selena Lesser  [phone] Adult & family shelter, food, utility & rent assistance  24 Hour crisis line for those facing homelessness  5798388274   [facility] Transit  [phone] Roxy Manns, [facility] public transportation system  Homecare Providers  [phone] HIV/AIDS Case Management, FREE HIV SCREEN  Medication Management  [phone] Ongoing medication assistance for patients meeting eligibility requirements  Medication Drop Box Locations: [place] Dept., [facility] Police Dept., [place] Dept., [facility] office  Safely rid of unused medications  [employer]  [phone] Crisis assistance, medication, housing, food, utility assistance  [place].  [facility])  [phone]

## 2015-08-28 ENCOUNTER — Emergency Department (HOSPITAL_COMMUNITY): Payer: No Typology Code available for payment source

## 2015-08-28 ENCOUNTER — Emergency Department (HOSPITAL_COMMUNITY)
Admission: EM | Admit: 2015-08-28 | Discharge: 2015-08-28 | Disposition: A | Discharge status: Home / Self Care | Payer: No Typology Code available for payment source | Attending: Emergency Medicine | Admitting: Emergency Medicine

## 2015-08-28 ENCOUNTER — Ambulatory Visit: Payer: No Typology Code available for payment source

## 2015-08-28 ENCOUNTER — Encounter (HOSPITAL_COMMUNITY): Payer: Self-pay | Admitting: Emergency Medicine

## 2015-08-28 DIAGNOSIS — J0100 Acute maxillary sinusitis, unspecified: Secondary | ICD-10-CM

## 2015-08-28 DIAGNOSIS — Z792 Long term (current) use of antibiotics: Secondary | ICD-10-CM | POA: Insufficient documentation

## 2015-08-28 DIAGNOSIS — Z791 Long term (current) use of non-steroidal anti-inflammatories (NSAID): Secondary | ICD-10-CM | POA: Insufficient documentation

## 2015-08-28 DIAGNOSIS — I10 Essential (primary) hypertension: Secondary | ICD-10-CM | POA: Insufficient documentation

## 2015-08-28 DIAGNOSIS — F17210 Nicotine dependence, cigarettes, uncomplicated: Secondary | ICD-10-CM | POA: Insufficient documentation

## 2015-08-28 DIAGNOSIS — F329 Major depressive disorder, single episode, unspecified: Secondary | ICD-10-CM | POA: Insufficient documentation

## 2015-08-28 DIAGNOSIS — Z79899 Other long term (current) drug therapy: Secondary | ICD-10-CM | POA: Insufficient documentation

## 2015-08-28 DIAGNOSIS — Z86018 Personal history of other benign neoplasm: Secondary | ICD-10-CM | POA: Insufficient documentation

## 2015-08-28 MED ORDER — PAROXETINE HCL 20 MG PO TABS: 10.0000 mg | ORAL_TABLET | Freq: Every day | ORAL | Status: DC

## 2015-08-28 MED ORDER — IPRATROPIUM-ALBUTEROL 0.5-2.5 (3) MG/3ML IN SOLN
3.0000 mL | Freq: Once | RESPIRATORY_TRACT | Status: AC
  Administered 2015-08-28: 3 mL via RESPIRATORY_TRACT
  Filled 2015-08-28: qty 3

## 2015-08-28 MED ORDER — AMOXICILLIN-POT CLAVULANATE 875-125 MG PO TABS: 1.0000 | ORAL_TABLET | Freq: Two times a day (BID) | ORAL | Status: DC

## 2015-08-28 NOTE — ED Notes (Signed)
Pt sts nasal congestion and cough with yellow sputum and sore throat; pt sts cough worse at night; pt sts some post tussive vomiting x 1 month

## 2015-08-28 NOTE — Discharge Instructions (Signed)

## 2015-08-30 ENCOUNTER — Ambulatory Visit: Payer: No Typology Code available for payment source

## 2015-08-31 ENCOUNTER — Encounter: Payer: No Typology Code available for payment source | Admitting: Internal Medicine

## 2015-08-31 NOTE — ED Provider Notes (Signed)
CSN: [id number]     Arrival date & time 08/28/15  [id number] History   First MD Initiated Contact with Patient 08/28/15 1125     Chief Complaint  Patient presents with  . Nasal Congestion  . Cough     (Consider location/radiation/quality/duration/timing/severity/associated sxs/prior Treatment) Patient is a 55 y.o. female presenting with cough.  Cough    Jennifer [name] is a 55 y.o F with a pmhx of polysubstance abuse, bipolar, hypertension who presents to the emergency department today complaining of cough, nasal congestion, sinus tenderness onset 1 month ago. Patient states that over the last month she has had a productive cough with yellow sputum and sinus pressure. Patient also complains of purulent nasal discharge. Patient states her cough is worse at night. Patient is now experiencing posttussive emesis, NBNB. She has tried multiple OTC remedies with no relief. Patient has history of bronchitis and is concerned she may have this again. Denies fever, chills, headache, shortness of breath, abdominal pain, dizziness, syncope, hemoptysis.  Patient is also requesting for her paroxetine to be refilled.  Past Medical History  Diagnosis Date  . Fibroids     now s/p hysterectomy  . Left ventricular hypertrophy   . Polysubstance abuse     IV drug [place], cocaine on occassion, smoking and alcoholism, has been going to AA, not completely sober  . Hypertension   . bipolar   . Depression    Past Surgical History  Procedure Laterality Date  . Abdominal hysterectomy      2003  . Exploratory laparotomy      complex pelvic mass 2011  . Bilateral salpingoophorectomy      01/2010  . Dilation and curettage of uterus    . Tonsillectomy     Family History  Problem Relation Age of Onset  . Hypertension Mother   . Hypertension Sister   . Diabetes Father    Social History  Substance Use Topics  . Smoking status: Current Every Day Smoker -- 0.50 packs/day for 16 years    Types: Cigarettes  .  Smokeless tobacco: Never Used     Comment: Wants to quit.   . Alcohol Use: No     Comment: Occasional drinker   OB History    Gravida Para Term Preterm AB TAB SAB Ectopic Multiple Living   5 0 0 0 4 0 1 0 0 0      Review of Systems  Respiratory: Positive for cough.   All other systems reviewed and are negative.     Allergies  Review of patient's allergies indicates no known allergies.  Home Medications   Prior to Admission medications   Medication Sig Start Date End Date Taking? Authorizing Provider  amoxicillin-clavulanate (AUGMENTIN) 875-125 MG tablet Take 1 tablet by mouth every 12 (twelve) hours. 08/28/15   Titania Gault Tripp Martinez Boxx, PA-C  atenolol (TENORMIN) 50 MG tablet Take 1 tablet (50 mg total) by mouth daily. 09/18/14   Rushil Sherrye Payor, MD  ibuprofen (ADVIL,MOTRIN) 800 MG tablet Take 1 tablet (800 mg total) by mouth 3 (three) times daily. 08/15/15   Jola Schmidt, MD  lisinopril-hydrochlorothiazide (PRINZIDE,ZESTORETIC) 20-12.5 MG per tablet Take 1 tablet by mouth daily. 03/22/15   Rushil Sherrye Payor, MD  PARoxetine (PAXIL) 20 MG tablet Take 0.5 tablets (10 mg total) by mouth daily. 08/28/15 08/27/16  Alva Broxson Tripp Hicks Feick, PA-C  penicillin v potassium (VEETID) 500 MG tablet Take 1 tablet (500 mg total) by mouth 3 (three) times daily. 08/15/15   Jola Schmidt, MD  BP 138/88 mmHg  Pulse 56  Temp(Src) 98.9 F (37.2 C) (Oral)  Resp 18  SpO2 100%  LMP 12/04/2009 Physical Exam  Constitutional: She is oriented to person, place, and time. She appears well-developed and well-nourished. No distress.  HENT:  Head: Normocephalic and atraumatic.  Right Ear: External ear normal.  Left Ear: External ear normal.  Nose: Rhinorrhea present. Right sinus exhibits maxillary sinus tenderness and frontal sinus tenderness. Left sinus exhibits maxillary sinus tenderness and frontal sinus tenderness.  Mouth/Throat: Oropharynx is clear and moist. No oropharyngeal exudate.  Eyes: Conjunctivae and EOM are  normal. Pupils are equal, round, and reactive to light. Right eye exhibits no discharge. Left eye exhibits no discharge. No scleral icterus.  Neck: Normal range of motion. Neck supple.  No meningismus.  Cardiovascular: Normal rate, regular rhythm, normal heart sounds and intact distal pulses.  Exam reveals no gallop and no friction rub.   No murmur heard. Pulmonary/Chest: Effort normal and breath sounds normal. No respiratory distress. She has no wheezes. She has no rales. She exhibits no tenderness.  Abdominal: Soft. She exhibits no distension. There is no tenderness. There is no guarding.  Musculoskeletal: Normal range of motion. She exhibits no edema.  Lymphadenopathy:    She has no cervical adenopathy.  Neurological: She is alert and oriented to person, place, and time.  Skin: Skin is warm and dry. No rash noted. She is not diaphoretic. No erythema. No pallor.  Psychiatric: She has a normal mood and affect. Her behavior is normal.  Nursing note and vitals reviewed.   ED Course  Procedures (including critical care time) Labs Review Labs Reviewed - No data to display  Imaging Review No results found. I have personally reviewed and evaluated these images and lab results as part of my medical decision-making.   EKG Interpretation None      MDM   Final diagnoses:  Acute maxillary sinusitis, recurrence not specified    Patient's symptoms have been present for greater than 10 days with purulent nasal discharge and maxillary sinus pain.  Concern for acute bacterial rhinosinusitis.  Patient discharged with Augmentin. CXR negative for acute infection. Lungs clear to auscultation. Instructions given for warm saline nasal wash and recommendations for follow-up with primary care physician.  Patient also requesting Paroxetine refill as she does not have a PCP appointment for another 2 weeks. Discussed treatment plan patient is agreeable. Return precautions outlined in patient discharge  instructions.        Dondra Spry [place], PA-C 09/01/15 0001  Daleen Bo, MD 09/01/15 1326

## 2015-09-05 ENCOUNTER — Emergency Department (HOSPITAL_COMMUNITY): Payer: No Typology Code available for payment source

## 2015-09-05 ENCOUNTER — Encounter (HOSPITAL_COMMUNITY): Payer: Self-pay

## 2015-09-05 ENCOUNTER — Emergency Department (HOSPITAL_COMMUNITY)
Admission: EM | Admit: 2015-09-05 | Discharge: 2015-09-05 | Disposition: A | Discharge status: Home / Self Care | Payer: No Typology Code available for payment source | Attending: Emergency Medicine | Admitting: Emergency Medicine

## 2015-09-05 DIAGNOSIS — Z79899 Other long term (current) drug therapy: Secondary | ICD-10-CM | POA: Insufficient documentation

## 2015-09-05 DIAGNOSIS — H9209 Otalgia, unspecified ear: Secondary | ICD-10-CM | POA: Insufficient documentation

## 2015-09-05 DIAGNOSIS — R059 Cough, unspecified: Secondary | ICD-10-CM

## 2015-09-05 DIAGNOSIS — J029 Acute pharyngitis, unspecified: Secondary | ICD-10-CM | POA: Insufficient documentation

## 2015-09-05 DIAGNOSIS — Z791 Long term (current) use of non-steroidal anti-inflammatories (NSAID): Secondary | ICD-10-CM | POA: Insufficient documentation

## 2015-09-05 DIAGNOSIS — R0981 Nasal congestion: Secondary | ICD-10-CM

## 2015-09-05 DIAGNOSIS — R0781 Pleurodynia: Secondary | ICD-10-CM | POA: Insufficient documentation

## 2015-09-05 DIAGNOSIS — Z86018 Personal history of other benign neoplasm: Secondary | ICD-10-CM | POA: Insufficient documentation

## 2015-09-05 DIAGNOSIS — R05 Cough: Secondary | ICD-10-CM | POA: Insufficient documentation

## 2015-09-05 DIAGNOSIS — Z792 Long term (current) use of antibiotics: Secondary | ICD-10-CM | POA: Insufficient documentation

## 2015-09-05 DIAGNOSIS — R1110 Vomiting, unspecified: Secondary | ICD-10-CM | POA: Insufficient documentation

## 2015-09-05 DIAGNOSIS — I10 Essential (primary) hypertension: Secondary | ICD-10-CM | POA: Insufficient documentation

## 2015-09-05 DIAGNOSIS — F319 Bipolar disorder, unspecified: Secondary | ICD-10-CM | POA: Insufficient documentation

## 2015-09-05 MED ORDER — BENZONATATE 100 MG PO CAPS
100.0000 mg | ORAL_CAPSULE | Freq: Once | ORAL | Status: AC
  Administered 2015-09-05: 100 mg via ORAL
  Filled 2015-09-05: qty 1

## 2015-09-05 MED ORDER — BENZONATATE 100 MG PO CAPS: 100.0000 mg | ORAL_CAPSULE | Freq: Three times a day (TID) | ORAL | Status: DC

## 2015-09-05 MED ORDER — FLUTICASONE PROPIONATE 50 MCG/ACT NA SUSP: 2.0000 | Freq: Every day | NASAL | Status: DC

## 2015-09-05 MED ORDER — FLUTICASONE PROPIONATE 50 MCG/ACT NA SUSP
2.0000 | Freq: Every day | NASAL | Status: DC
  Filled 2015-09-05: qty 16

## 2015-09-05 NOTE — ED Provider Notes (Signed)
CSN: [id number]     Arrival date & time 09/05/15  1116 History  By signing my name below, I, Evelene Croon, attest that this documentation has been prepared under the direction and in the presence of non-physician practitioner, Quincy Carnes, PA-C. Electronically Signed: Evelene Croon, Scribe. 09/05/2015. 2:10 PM.    Chief Complaint  Patient presents with  . Cough     The history is provided by the patient and medical records. No language interpreter was used.    HPI Comments:  Jennifer [name] is a 55 y.o. female who presents to the Emergency Department complaining of persistent, often productive, cough x ~2 months; states she can feel something moving in her throat. Her cough is worse at night.  She reports associated rib pain secondary to cough and sporadic episodes of post tussive vomiting. Pt also notes ear pain x a few days. She denies recent fever, nausea, and diarrhea.  No alleviating factors noted.  Pt was seen in the ED on 08/28/15 for nasal congestion and cough. She had negative CXR, was diagnosed with sinusitis, and  discharged with Augmentin which she has completed. She notes the antibiotic has provided no relief.  Pt also came to the ED this AM for her current symptom and LWBS. She is currently on Paxil for depression.   Past Medical History  Diagnosis Date  . Fibroids     now s/p hysterectomy  . Left ventricular hypertrophy   . Polysubstance abuse     IV drug [place], cocaine on occassion, smoking and alcoholism, has been going to AA, not completely sober  . Hypertension   . bipolar   . Depression    Past Surgical History  Procedure Laterality Date  . Abdominal hysterectomy      2003  . Exploratory laparotomy      complex pelvic mass 2011  . Bilateral salpingoophorectomy      01/2010  . Dilation and curettage of uterus    . Tonsillectomy     Family History  Problem Relation Age of Onset  . Hypertension Mother   . Hypertension Sister   . Diabetes Father    Social  History  Substance Use Topics  . Smoking status: Current Every Day Smoker -- 0.50 packs/day for 16 years    Types: Cigarettes  . Smokeless tobacco: Never Used     Comment: Wants to quit.   . Alcohol Use: No     Comment: Occasional drinker   OB History    Gravida Para Term Preterm AB TAB SAB Ectopic Multiple Living   5 0 0 0 4 0 1 0 0 0      Review of Systems  Constitutional: Negative for fever and chills.  HENT: Positive for congestion, ear pain and sore throat.   Respiratory: Positive for cough. Negative for shortness of breath.   Cardiovascular: Positive for chest pain (Rib pain).  Gastrointestinal: Positive for vomiting (post-tussive, resolved). Negative for nausea and diarrhea.  All other systems reviewed and are negative.   Allergies  Review of patient's allergies indicates no known allergies.  Home Medications   Prior to Admission medications   Medication Sig Start Date End Date Taking? Authorizing Provider  amoxicillin-clavulanate (AUGMENTIN) 875-125 MG tablet Take 1 tablet by mouth every 12 (twelve) hours. 08/28/15   Samantha Tripp Dowless, PA-C  atenolol (TENORMIN) 50 MG tablet Take 1 tablet (50 mg total) by mouth daily. 09/18/14   Rushil Sherrye Payor, MD  ibuprofen (ADVIL,MOTRIN) 800 MG tablet Take 1 tablet (800  mg total) by mouth 3 (three) times daily. 08/15/15   Jola Schmidt, MD  lisinopril-hydrochlorothiazide (PRINZIDE,ZESTORETIC) 20-12.5 MG per tablet Take 1 tablet by mouth daily. 03/22/15   Rushil Sherrye Payor, MD  PARoxetine (PAXIL) 20 MG tablet Take 0.5 tablets (10 mg total) by mouth daily. 08/28/15 08/27/16  Samantha Tripp Dowless, PA-C  penicillin v potassium (VEETID) 500 MG tablet Take 1 tablet (500 mg total) by mouth 3 (three) times daily. 08/15/15   Jola Schmidt, MD   BP 141/91 mmHg  Pulse 52  Temp(Src) 98.3 F (36.8 C) (Oral)  Resp 16  Ht 5\' 4"  (1.626 m)  Wt 203 lb (92.08 kg)  BMI 34.83 kg/m2  SpO2 99%  LMP 12/04/2009   Physical Exam  Constitutional: She is oriented  to person, place, and time. She appears well-developed and well-nourished.  HENT:  Head: Normocephalic and atraumatic.  Right Ear: Tympanic membrane and ear canal normal.  Left Ear: Tympanic membrane and ear canal normal.  Nose: Nose normal.  Mouth/Throat: Uvula is midline, oropharynx is clear and moist and mucous membranes are normal. No oropharyngeal exudate, posterior oropharyngeal edema, posterior oropharyngeal erythema or tonsillar abscesses.  Eyes: Conjunctivae and EOM are normal. Pupils are equal, round, and reactive to light.  Neck: Normal range of motion.  Cardiovascular: Normal rate, regular rhythm and normal heart sounds.   Pulmonary/Chest: Effort normal and breath sounds normal. No respiratory distress. She has no wheezes. She has no rhonchi.  Productive cough noted, no wheezes or rhonchi, no distress, speaking in full sentences without difficulty  Abdominal: Soft. Bowel sounds are normal. There is no tenderness. There is no guarding.  Musculoskeletal: Normal range of motion.  Neurological: She is alert and oriented to person, place, and time.  Skin: Skin is warm and dry.  Psychiatric: She has a normal mood and affect.  Nursing note and vitals reviewed.   ED Course  Procedures   DIAGNOSTIC STUDIES:  Oxygen Saturation is 99% on RA, normal by my interpretation.    COORDINATION OF CARE:  1:59 PM Will discharge with cough and pain med. Discussed treatment plan with pt at bedside and pt agreed to plan.  Imaging Review Dg Chest 2 View  09/05/2015  CLINICAL DATA:  Cough, nausea, and sore throat since 08/28/2015 visit, history hypertension, LEFT ventricular hypertrophy EXAM: CHEST  2 VIEW COMPARISON:  08/28/2015 FINDINGS: Enlargement of cardiac silhouette. Tortuous aorta. Mediastinal contours and pulmonary vascularity normal. Linear scarring versus persistent subsegmental atelectasis in lingula unchanged. No acute infiltrate, pleural effusion or pneumothorax. Bones unremarkable.  IMPRESSION: Enlargement of cardiac silhouette. No acute abnormalities. Persistent linear atelectasis versus scarring in lingula. Electronically Signed   By: Lavonia Dana M.D.   On: 09/05/2015 12:52   I have personally reviewed and evaluated these images as part of my medical decision-making.   MDM   Final diagnoses:  Nasal congestion  Cough   55 y.o. F here with continued cough and nasal congestion.  Seen here last week, started on augmentin which she has finished, however no improvement.  Patient afebrile, non-toxic.  She does have a productive cough on exam, however lungs overall clear.  HEENT exam WNL.  CXR today without focal infiltrate.  No chest pain or SOB.  Suspicion that symptoms are viral in nature given they have not improved despite antibiotic therapy. Have recommended nasal spray, decongestants, and will give Tessalon to help with cough. Follow-up with PCP.  Discussed plan with patient, he/she acknowledged understanding and agreed with plan of care.  Return  precautions given for new or worsening symptoms.  I personally performed the services described in this documentation, which was scribed in my presence. The recorded information has been reviewed and is accurate.  Larene Pickett, PA-C 09/05/15 [place], MD 09/05/15 [phone]

## 2015-09-05 NOTE — ED Notes (Signed)
Per pt, seen x 1 week ago with shortness of breath and  Cough/sore throat.  Pt treated for sinus infection and given antibiotic.  Symptoms not improved.  More complaint of cough.

## 2015-09-05 NOTE — Discharge Instructions (Signed)
Take the prescribed medication as directed. °Follow-up with your primary care physician. °Return to the ED for new or worsening symptoms. ° °

## 2015-09-05 NOTE — ED Notes (Signed)
Pt has walked back in from outside.  Will get next room available.

## 2015-09-05 NOTE — Progress Notes (Signed)
Pt does not appear in distress. She was given flonase and her RX.

## 2015-09-05 NOTE — ED Notes (Signed)
Pt not found in lobby.  Called for x 2

## 2015-09-05 NOTE — ED Notes (Signed)
Pt states she just wants fast tracked so she can get out of here.

## 2015-09-05 NOTE — ED Provider Notes (Signed)
Patient called from lobby however did not answer.  She was never placed into room.  Patient LWBS after triage.  I did not see or evaluate patient.  Larene Pickett, PA-C 09/05/15 [place], MD 09/05/15 [phone]

## 2015-09-26 ENCOUNTER — Emergency Department (HOSPITAL_COMMUNITY)
Admission: EM | Admit: 2015-09-26 | Discharge: 2015-09-26 | Disposition: A | Discharge status: Home / Self Care | Payer: No Typology Code available for payment source

## 2015-09-27 ENCOUNTER — Emergency Department (HOSPITAL_COMMUNITY)
Admission: EM | Admit: 2015-09-27 | Discharge: 2015-09-27 | Disposition: A | Discharge status: Home / Self Care | Payer: No Typology Code available for payment source | Attending: Emergency Medicine | Admitting: Emergency Medicine

## 2015-09-27 ENCOUNTER — Encounter (HOSPITAL_COMMUNITY): Payer: Self-pay | Admitting: Cardiology

## 2015-09-27 ENCOUNTER — Telehealth: Payer: Self-pay | Admitting: Internal Medicine

## 2015-09-27 DIAGNOSIS — Z79899 Other long term (current) drug therapy: Secondary | ICD-10-CM | POA: Insufficient documentation

## 2015-09-27 DIAGNOSIS — I160 Hypertensive urgency: Secondary | ICD-10-CM | POA: Insufficient documentation

## 2015-09-27 DIAGNOSIS — Z7951 Long term (current) use of inhaled steroids: Secondary | ICD-10-CM | POA: Insufficient documentation

## 2015-09-27 DIAGNOSIS — F419 Anxiety disorder, unspecified: Secondary | ICD-10-CM | POA: Insufficient documentation

## 2015-09-27 DIAGNOSIS — F17210 Nicotine dependence, cigarettes, uncomplicated: Secondary | ICD-10-CM | POA: Insufficient documentation

## 2015-09-27 DIAGNOSIS — Z86018 Personal history of other benign neoplasm: Secondary | ICD-10-CM | POA: Insufficient documentation

## 2015-09-27 MED ORDER — LISINOPRIL 20 MG PO TABS: 20.0000 mg | ORAL_TABLET | Freq: Once | ORAL | Status: DC

## 2015-09-27 MED ORDER — ATENOLOL 50 MG PO TABS: 50.0000 mg | ORAL_TABLET | Freq: Every day | ORAL | Status: DC

## 2015-09-27 MED ORDER — HYDROCHLOROTHIAZIDE 12.5 MG PO CAPS: 12.5000 mg | ORAL_CAPSULE | Freq: Every day | ORAL | Status: DC

## 2015-09-27 MED ORDER — ALBUTEROL SULFATE HFA 108 (90 BASE) MCG/ACT IN AERS
2.0000 | INHALATION_SPRAY | Freq: Four times a day (QID) | RESPIRATORY_TRACT | Status: DC
  Administered 2015-09-27: 2 via RESPIRATORY_TRACT
  Filled 2015-09-27: qty 6.7

## 2015-09-27 MED ORDER — ATENOLOL 50 MG PO TABS
50.0000 mg | ORAL_TABLET | Freq: Once | ORAL | Status: AC
  Administered 2015-09-27: 50 mg via ORAL
  Filled 2015-09-27: qty 1

## 2015-09-27 MED ORDER — LISINOPRIL 20 MG PO TABS
20.0000 mg | ORAL_TABLET | Freq: Once | ORAL | Status: AC
  Administered 2015-09-27: 20 mg via ORAL
  Filled 2015-09-27: qty 1

## 2015-09-27 MED ORDER — HYDROCHLOROTHIAZIDE 12.5 MG PO CAPS
12.5000 mg | ORAL_CAPSULE | Freq: Once | ORAL | Status: AC
  Administered 2015-09-27: 12.5 mg via ORAL
  Filled 2015-09-27: qty 1

## 2015-09-27 MED ORDER — PAROXETINE HCL 20 MG PO TABS: 10.0000 mg | ORAL_TABLET | Freq: Every day | ORAL | Status: DC

## 2015-09-27 NOTE — Telephone Encounter (Signed)
APPT. REMINDER CALL/ NO ANSWER, NO VOICE MAIL

## 2015-09-27 NOTE — ED Notes (Signed)
IV team at bedside 

## 2015-09-27 NOTE — ED Notes (Signed)
Pt very upset and angry when I entered the room to assess her. Pt stating she has been here multiple times and we did "NOTHING." This RN calmly explained to patient that our team will assess her today to look for emergent/life-threatening problems. MD at bedside speaking with patient in more detail.

## 2015-09-27 NOTE — Discharge Instructions (Signed)
As discussed, it is important that you follow up as soon as possible with your physician for continued management of your condition.  Using the provided albuterol inhaler will decrease your breathing difficulty.  If you develop any new, or concerning changes in your condition, please return to the emergency department immediately.

## 2015-09-27 NOTE — ED Provider Notes (Signed)
CSN: [id number]     Arrival date & time 09/27/15  [id number] History   First MD Initiated Contact with Patient 09/27/15 [phone]     Chief Complaint  Patient presents with  . Hypertension     HPI  Patient presents with concern of ongoing hypertension, occasional difficulty breathing. Patient is a 4 days ago she ran out of her blood pressure medication. She was prescribed lisinopril, hydrocortisone, beta blocker by primary care, but states that she cannot afford that medication any longer. Over the past 4 days she has noticed persistent elevated hypertension, with no syncope, chest pain, vomiting, other changes from baseline. Patient typically has wheezing episodes, these are persistent. She voices frustration at her inability to obtain her medication reliably her persistent episodes of dyspnea. Patient states that she has not yet seen her primary care team since her recent evaluation, but is scheduled to do so next week.   Past Medical History  Diagnosis Date  . Fibroids     now s/p hysterectomy  . Left ventricular hypertrophy   . Polysubstance abuse     IV drug [place], cocaine on occassion, smoking and alcoholism, has been going to AA, not completely sober  . Hypertension   . bipolar   . Depression    Past Surgical History  Procedure Laterality Date  . Abdominal hysterectomy      2003  . Exploratory laparotomy      complex pelvic mass 2011  . Bilateral salpingoophorectomy      01/2010  . Dilation and curettage of uterus    . Tonsillectomy     Family History  Problem Relation Age of Onset  . Hypertension Mother   . Hypertension Sister   . Diabetes Father    Social History  Substance Use Topics  . Smoking status: Current Every Day Smoker -- 0.50 packs/day for 16 years    Types: Cigarettes  . Smokeless tobacco: Never Used     Comment: Wants to quit.   . Alcohol Use: No     Comment: Occasional drinker   OB History    Gravida Para Term Preterm AB TAB SAB Ectopic Multiple Living    5 0 0 0 4 0 1 0 0 0      Review of Systems  Constitutional:       Per HPI, otherwise negative  HENT:       Per HPI, otherwise negative  Respiratory:       Per HPI, otherwise negative  Cardiovascular:       Per HPI, otherwise negative  Gastrointestinal: Negative for vomiting.  Endocrine:       Negative aside from HPI  Genitourinary:       Neg aside from HPI   Musculoskeletal:       Per HPI, otherwise negative  Skin: Negative.   Neurological: Negative for syncope and weakness.      Allergies  Review of patient's allergies indicates no known allergies.  Home Medications   Prior to Admission medications   Medication Sig Start Date End Date Taking? Authorizing Provider  amoxicillin-clavulanate (AUGMENTIN) 875-125 MG tablet Take 1 tablet by mouth every 12 (twelve) hours. 08/28/15   Samantha Tripp Dowless, PA-C  atenolol (TENORMIN) 50 MG tablet Take 1 tablet (50 mg total) by mouth daily. 09/27/15   Carmin Muskrat, MD  benzonatate (TESSALON) 100 MG capsule Take 1 capsule (100 mg total) by mouth every 8 (eight) hours. 09/05/15   Larene Pickett, PA-C  fluticasone (FLONASE) 50 MCG/ACT nasal spray  Place 2 sprays into both nostrils daily. 09/05/15   Larene Pickett, PA-C  hydrochlorothiazide (MICROZIDE) 12.5 MG capsule Take 1 capsule (12.5 mg total) by mouth daily. 09/27/15   Carmin Muskrat, MD  ibuprofen (ADVIL,MOTRIN) 800 MG tablet Take 1 tablet (800 mg total) by mouth 3 (three) times daily. 08/15/15   Jola Schmidt, MD  lisinopril (PRINIVIL,ZESTRIL) 20 MG tablet Take 1 tablet (20 mg total) by mouth once. 09/27/15   Carmin Muskrat, MD  lisinopril-hydrochlorothiazide (PRINZIDE,ZESTORETIC) 20-12.5 MG per tablet Take 1 tablet by mouth daily. 03/22/15   Rushil Sherrye Payor, MD  PARoxetine (PAXIL) 20 MG tablet Take 0.5 tablets (10 mg total) by mouth daily. 09/27/15 09/26/16  Carmin Muskrat, MD  penicillin v potassium (VEETID) 500 MG tablet Take 1 tablet (500 mg total) by mouth 3 (three) times daily.  08/15/15   Jola Schmidt, MD   BP 154/99 mmHg  Pulse 79  Temp(Src) 97.5 F (36.4 C) (Oral)  Resp 18  SpO2 98%  LMP 12/04/2009 Physical Exam  Constitutional: She is oriented to person, place, and time. She appears well-developed and well-nourished. No distress.  Irritable, but appropriately interactive F  HENT:  Head: Normocephalic and atraumatic.  Eyes: Conjunctivae and EOM are normal.  Cardiovascular: Normal rate and regular rhythm.   Pulmonary/Chest: Effort normal and breath sounds normal. No stridor. No respiratory distress. She has no wheezes.  Slight coarse breath sounds, no substantial wheezing, no increased work of breathing, no respiratory distress  Abdominal: She exhibits no distension.  Musculoskeletal: She exhibits no edema.  Neurological: She is alert and oriented to person, place, and time. No cranial nerve deficit. She exhibits normal muscle tone. Coordination normal.  Skin: Skin is warm and dry.  Psychiatric: Her mood appears anxious. She is aggressive. Cognition and memory are not impaired.  She requests Paxil refill  Nursing note and vitals reviewed.   ED Course  Procedures (including critical care time)  Chart review notable for several prior visits for similar concerns, hypertension.  Patient requests single dose of her typical home blood pressure medication, as well as prescription refills for multiple medication. I confirm the patient will follow-up with primary care within the next week. MDM   Final diagnoses:  Hypertensive urgency   Patient is awake, alert, presents with concern of hypertension, occasional dyspnea. Here the patient is in no distress, with no evidence for neurologic dysfunction, no hemodynamic instability. She does have mild elevation in blood pressure, though no critical abnormality. After lengthy conversation of the need to obtain some medication, and discussion of possibility of having case management assist with this, which the patient  deferred, she was provided new prescriptions, dose of her medication here, breathing treatment here, discharged in stable condition.   Carmin Muskrat, MD 09/27/15 1040

## 2015-09-27 NOTE — ED Notes (Signed)
Pt is in stable condition upon d/c and ambulates from ED. 

## 2015-09-27 NOTE — ED Notes (Signed)
Pt reports she has been out of her BP medication for about 4 days. Says her insurance card has not been changed and has not been able to get the medication.

## 2015-09-28 ENCOUNTER — Encounter: Payer: No Typology Code available for payment source | Admitting: Internal Medicine

## 2015-09-28 ENCOUNTER — Telehealth: Payer: Self-pay | Admitting: Internal Medicine

## 2015-09-28 NOTE — Telephone Encounter (Signed)
APPT REMINDER CALL, LMTCB IF SHE NEEDS TO CANCEL °

## 2015-10-01 ENCOUNTER — Encounter: Payer: Self-pay | Admitting: Internal Medicine

## 2015-10-01 ENCOUNTER — Telehealth: Payer: Self-pay | Admitting: *Deleted

## 2015-10-01 ENCOUNTER — Ambulatory Visit (INDEPENDENT_AMBULATORY_CARE_PROVIDER_SITE_OTHER): Payer: No Typology Code available for payment source | Admitting: Internal Medicine

## 2015-10-01 ENCOUNTER — Ambulatory Visit: Payer: No Typology Code available for payment source

## 2015-10-01 VITALS — BP 144/81 | HR 83 | Temp 98.3°F | Ht 64.0 in | Wt 215.8 lb

## 2015-10-01 DIAGNOSIS — J069 Acute upper respiratory infection, unspecified: Secondary | ICD-10-CM

## 2015-10-01 DIAGNOSIS — J3489 Other specified disorders of nose and nasal sinuses: Secondary | ICD-10-CM

## 2015-10-01 DIAGNOSIS — K029 Dental caries, unspecified: Secondary | ICD-10-CM

## 2015-10-01 DIAGNOSIS — H9313 Tinnitus, bilateral: Secondary | ICD-10-CM

## 2015-10-01 DIAGNOSIS — F3112 Bipolar disorder, current episode manic without psychotic features, moderate: Secondary | ICD-10-CM

## 2015-10-01 DIAGNOSIS — R51 Headache: Secondary | ICD-10-CM

## 2015-10-01 DIAGNOSIS — R05 Cough: Secondary | ICD-10-CM

## 2015-10-01 DIAGNOSIS — F17210 Nicotine dependence, cigarettes, uncomplicated: Secondary | ICD-10-CM

## 2015-10-01 MED ORDER — LORATADINE 10 MG PO TABS: 10.0000 mg | ORAL_TABLET | Freq: Every day | ORAL | Status: DC

## 2015-10-01 MED ORDER — ACETAMINOPHEN 500 MG PO TABS: 1000.0000 mg | ORAL_TABLET | Freq: Once | ORAL | Status: DC

## 2015-10-01 NOTE — Telephone Encounter (Signed)
[place] calls and needs to change loratadine to cetirizine 10mg , is this ok? If so please change the medlist

## 2015-10-01 NOTE — Assessment & Plan Note (Signed)
A: Multiple dental carries  P:  She has multiple dental carries and likely needs extraction of a lower right molar,  I do not see signs of an acute infection and will no prescribe antibiotics today. -Referral to dentistry

## 2015-10-01 NOTE — Assessment & Plan Note (Signed)
A: Bipolar disorder with manic episode  P: I feel her frequent utilization of the ED is in part contributed by uncontrolled bipolar disorder.  I have discussed with her that she needs to be seen and treated by a psychiatrist.  She reports that she does not like to see [facility] and that paxil is the only medication she needs.  I have discussed that frequently we need other medications and that I would like her to see psychiatry for medication management. -Referral to psychiatry.

## 2015-10-01 NOTE — Progress Notes (Signed)
[facility] Subjective:   [name] female   DOB: 14-Aug-1960 55 y.o.   MRN: [id number]  HPI: Jennifer [name] is a 55 y.o. female with a PMH detailed below who presents for ED follow up.  Since her last visit on 03/29/15 she has had 7 ER visits by my count. 4 of these were due to dental pain from dental carries for which she was treated with antibiotics.  She was then seen for cough and nasal congestion on 1/17 and diagnosed with sinusitis and discharged with Augmentin.  She completed the course of Augmentin and had no relief and returned to the ED 1 week later with similar compilations.  CXR on both of these occasions were negative except for some atelectasis.  She returned to the ED on 2/16 with CC of HTN for medication refills.  She was provided these refills and actually has the written scripts with her today, she has yet to fill the medication.  Today her complaints are nasal congestion, occasionally productive cough, ringing in ears, throbbing headache.  She reports these have all been going for about a month since her ED visit.  The Augmentin did not help.  Flonase may have helped some, she is not sure.  She has also been given an albuterol inhaler which she reports helps some.  Mostly today she repeats her self frequently requesting refill of her paxil, as well as an antibiotic for dental infections.      Past Medical History  Diagnosis Date  . Fibroids     now s/p hysterectomy  . Left ventricular hypertrophy   . Polysubstance abuse     IV drug [place], cocaine on occassion, smoking and alcoholism, has been going to AA, not completely sober  . Hypertension   . bipolar   . Depression    Current Outpatient Prescriptions  Medication Sig Dispense Refill  . atenolol (TENORMIN) 50 MG tablet Take 1 tablet (50 mg total) by mouth daily. 30 tablet 0  . fluticasone (FLONASE) 50 MCG/ACT nasal spray Place 2 sprays into both nostrils daily. 16 g 0  .  hydrochlorothiazide (MICROZIDE) 12.5 MG capsule Take 1 capsule (12.5 mg total) by mouth daily. 30 capsule 0  . ibuprofen (ADVIL,MOTRIN) 800 MG tablet Take 1 tablet (800 mg total) by mouth 3 (three) times daily. 15 tablet 0  . lisinopril (PRINIVIL,ZESTRIL) 20 MG tablet Take 1 tablet (20 mg total) by mouth once. 30 tablet 0  . lisinopril-hydrochlorothiazide (PRINZIDE,ZESTORETIC) 20-12.5 MG per tablet Take 1 tablet by mouth daily. 90 tablet 3  . loratadine (CLARITIN) 10 MG tablet Take 1 tablet (10 mg total) by mouth daily. 30 tablet 2  . PARoxetine (PAXIL) 20 MG tablet Take 0.5 tablets (10 mg total) by mouth daily. 30 tablet 0  . penicillin v potassium (VEETID) 500 MG tablet Take 1 tablet (500 mg total) by mouth 3 (three) times daily. 21 tablet 0   Current Facility-Administered Medications  Medication Dose Route Frequency Provider Last Rate Last Dose  . acetaminophen (TYLENOL) tablet 1,000 mg  1,000 mg Oral Once Lucious Groves, DO       Family History  Problem Relation Age of Onset  . Hypertension Mother   . Hypertension Sister   . Diabetes Father    Social History   Social History  . Marital Status: Single    Spouse Name: N/A  . Number of Children: N/A  . Years of Education: N/A   Social History Main  Topics  . Smoking status: Current Every Day Smoker -- 0.50 packs/day for 16 years    Types: Cigarettes  . Smokeless tobacco: Never Used     Comment: Wants to quit.   . Alcohol Use: No     Comment: Occasional drinker  . Drug Use: No  . Sexual Activity: Not Asked   Other Topics Concern  . None   Social History Narrative   Financial assistance approved for 100% discount at [facility] and has [facility] card per Bonna Gains   07/19/2010         Review of Systems: Review of Systems  Constitutional: Negative for fever and chills.  HENT: Positive for congestion, sore throat and tinnitus. Negative for ear discharge, ear pain, hearing loss and nosebleeds.   Eyes: Negative for blurred vision.   Respiratory: Positive for cough and sputum production. Negative for hemoptysis, shortness of breath and wheezing.   Cardiovascular: Negative for chest pain.  Gastrointestinal: Negative for abdominal pain.  Genitourinary: Negative for dysuria.  Musculoskeletal: Negative for myalgias.  Neurological: Positive for headaches.  Psychiatric/Behavioral: Negative for depression.     Objective:  Physical Exam: Filed Vitals:   10/01/15 1001 10/01/15 1002  BP: 144/81   Pulse: 83   Temp: 98.3 F (36.8 C)   TempSrc: Oral   Height:  5\' 4"  (1.626 m)  Weight: 215 lb 12.8 oz (97.886 kg)   SpO2: 98%   Physical Exam  HENT:  Nose: Mucosal edema and rhinorrhea present. No nasal deformity.  Cardiovascular: Normal rate and regular rhythm.   Pulmonary/Chest: Effort normal and breath sounds normal. She has no wheezes.  Musculoskeletal: She exhibits no edema.  Psychiatric: Her affect is labile. She is agitated. She does not exhibit a depressed mood.  Nursing note and vitals reviewed.   Assessment & Plan:  Case discussed with Dr. Daryll Drown  Acute upper respiratory infection A: URI  P:  I suspect her constellation of symptoms most likely represents a viral URI.  I have discussed supportive care measures and encouraged adherence to Flonase nasal spray. I will add loratadine to help control her symptoms.  She has already had 2 negative chest xrays and I do not feel anther is warranted at this time.  I will have her follow up in 1 month  -Per her request she was given 1000mg  of tylenol for an acute headache today.  Dental caries A: Multiple dental carries  P:  She has multiple dental carries and likely needs extraction of a lower right molar,  I do not see signs of an acute infection and will no prescribe antibiotics today. -Referral to dentistry   Bipolar disorder, current episode manic without psychotic features, moderate A: Bipolar disorder with manic episode  P: I feel her frequent utilization of  the ED is in part contributed by uncontrolled bipolar disorder.  I have discussed with her that she needs to be seen and treated by a psychiatrist.  She reports that she does not like to see [facility] and that paxil is the only medication she needs.  I have discussed that frequently we need other medications and that I would like her to see psychiatry for medication management. -Referral to psychiatry.    Medications Ordered Meds ordered this encounter  Medications  . acetaminophen (TYLENOL) tablet 1,000 mg    Sig:   . loratadine (CLARITIN) 10 MG tablet    Sig: Take 1 tablet (10 mg total) by mouth daily.    Dispense:  30 tablet  Refill:  2   Other Orders Orders Placed This Encounter  Procedures  . Ambulatory referral to Dentistry    Referral Priority:  Routine    Referral Type:  Consultation    Referral Reason:  Specialty Services Required    Requested Specialty:  Dental General Practice    Number of Visits Requested:  1  . Ambulatory referral to Psychiatry    Referral Priority:  Routine    Referral Type:  Psychiatric    Referral Reason:  Specialty Services Required    Requested Specialty:  Psychiatry    Number of Visits Requested:  1   Follow Up: Return in about 1 month (around 10/29/2015), or if symptoms worsen or fail to improve.

## 2015-10-01 NOTE — Patient Instructions (Signed)
General Instructions:   Please bring your medicines with you each time you come to clinic.  Medicines may include prescription medications, over-the-counter medications, herbal remedies, eye drops, vitamins, or other pills.   Progress Toward Treatment Goals:  Treatment Goal 01/11/2015  Blood pressure deteriorated  Stop smoking smoking the same amount    Self Care Goals & Plans:  Self Care Goal 02/13/2015  Manage my medications take my medicines as prescribed; bring my medications to every visit; refill my medications on time  Monitor my health -  Eat healthy foods drink diet soda or water instead of juice or soda; eat more vegetables; eat foods that are low in salt; eat baked foods instead of fried foods; eat fruit for snacks and desserts  Be physically active -  Stop smoking -  Meeting treatment goals -    No flowsheet data found.   Care Management & Community Referrals:  Referral 11/27/2014  Referrals made for care management support none needed  Referrals made to community resources none

## 2015-10-01 NOTE — Assessment & Plan Note (Addendum)
A: URI  P:  I suspect her constellation of symptoms most likely represents a viral URI.  I have discussed supportive care measures and encouraged adherence to Flonase nasal spray. I will add loratadine to help control her symptoms.  She has already had 2 negative chest xrays and I do not feel anther is warranted at this time.  I will have her follow up in 1 month  -Per her request she was given 1000mg  of tylenol for an acute headache today.

## 2015-10-02 ENCOUNTER — Telehealth: Payer: Self-pay | Admitting: Licensed Clinical Social Worker

## 2015-10-02 MED ORDER — CETIRIZINE HCL 10 MG PO TABS: 10.0000 mg | ORAL_TABLET | Freq: Every day | ORAL | Status: DC

## 2015-10-02 NOTE — Progress Notes (Signed)
Internal Medicine Clinic Attending  Case discussed with Dr. Hoffman at the time of the visit.  We reviewed the resident's history and exam and pertinent patient test results.  I agree with the assessment, diagnosis, and plan of care documented in the resident's note.  

## 2015-10-02 NOTE — Telephone Encounter (Signed)
Yes, we can make that change. Please call it in.

## 2015-10-02 NOTE — Telephone Encounter (Signed)
CSW placed call to [name] to f/u on pt's psychiatry referral placed during most recent office visit.  Pt returned call to this worker stating she did not want to go to [place].  CSW informed Ms. Oland with [facility] card/CA[facility] there are more options available.  CSW informed [name], [place] and [employer] of Care accept [facility].  Pt is unfamiliar with [place] and location and requests referral to be sent to [place] confirmed pt's mailing address, information will be mailed along with crisis information.

## 2015-11-03 ENCOUNTER — Encounter (HOSPITAL_COMMUNITY): Payer: Self-pay | Admitting: *Deleted

## 2015-11-03 ENCOUNTER — Emergency Department (HOSPITAL_COMMUNITY)
Admission: EM | Admit: 2015-11-03 | Discharge: 2015-11-03 | Disposition: A | Discharge status: Home / Self Care | Payer: No Typology Code available for payment source | Attending: Emergency Medicine | Admitting: Emergency Medicine

## 2015-11-03 DIAGNOSIS — Z79899 Other long term (current) drug therapy: Secondary | ICD-10-CM | POA: Insufficient documentation

## 2015-11-03 DIAGNOSIS — Z791 Long term (current) use of non-steroidal anti-inflammatories (NSAID): Secondary | ICD-10-CM | POA: Insufficient documentation

## 2015-11-03 DIAGNOSIS — F17210 Nicotine dependence, cigarettes, uncomplicated: Secondary | ICD-10-CM | POA: Insufficient documentation

## 2015-11-03 DIAGNOSIS — K047 Periapical abscess without sinus: Secondary | ICD-10-CM

## 2015-11-03 DIAGNOSIS — Z792 Long term (current) use of antibiotics: Secondary | ICD-10-CM | POA: Insufficient documentation

## 2015-11-03 DIAGNOSIS — IMO0001 Reserved for inherently not codable concepts without codable children: Secondary | ICD-10-CM

## 2015-11-03 DIAGNOSIS — I10 Essential (primary) hypertension: Secondary | ICD-10-CM | POA: Insufficient documentation

## 2015-11-03 DIAGNOSIS — Z7952 Long term (current) use of systemic steroids: Secondary | ICD-10-CM | POA: Insufficient documentation

## 2015-11-03 DIAGNOSIS — F319 Bipolar disorder, unspecified: Secondary | ICD-10-CM | POA: Insufficient documentation

## 2015-11-03 DIAGNOSIS — R030 Elevated blood-pressure reading, without diagnosis of hypertension: Secondary | ICD-10-CM

## 2015-11-03 DIAGNOSIS — Z86018 Personal history of other benign neoplasm: Secondary | ICD-10-CM | POA: Insufficient documentation

## 2015-11-03 DIAGNOSIS — K029 Dental caries, unspecified: Secondary | ICD-10-CM | POA: Insufficient documentation

## 2015-11-03 MED ORDER — PENICILLIN V POTASSIUM 500 MG PO TABS
500.0000 mg | ORAL_TABLET | Freq: Once | ORAL | Status: AC
  Administered 2015-11-03: 500 mg via ORAL
  Filled 2015-11-03: qty 1

## 2015-11-03 MED ORDER — PENICILLIN V POTASSIUM 500 MG PO TABS: 500.0000 mg | ORAL_TABLET | Freq: Four times a day (QID) | ORAL | Status: AC

## 2015-11-03 MED ORDER — LISINOPRIL 20 MG PO TABS
20.0000 mg | ORAL_TABLET | Freq: Once | ORAL | Status: AC
  Administered 2015-11-03: 20 mg via ORAL
  Filled 2015-11-03: qty 1

## 2015-11-03 MED ORDER — HYDROCODONE-ACETAMINOPHEN 5-325 MG PO TABS
1.0000 | ORAL_TABLET | Freq: Once | ORAL | Status: AC
  Administered 2015-11-03: 1 via ORAL
  Filled 2015-11-03: qty 1

## 2015-11-03 MED ORDER — HYDROCHLOROTHIAZIDE 12.5 MG PO CAPS
12.5000 mg | ORAL_CAPSULE | Freq: Once | ORAL | Status: AC
  Administered 2015-11-03: 12.5 mg via ORAL
  Filled 2015-11-03: qty 1

## 2015-11-03 NOTE — ED Provider Notes (Signed)
CSN: [id number]     Arrival date & time 11/03/15  1015 History   First MD Initiated Contact with Patient 11/03/15 1035     Chief Complaint  Patient presents with  . Dental Pain  . Headache  . Otalgia     (Consider location/radiation/quality/duration/timing/severity/associated sxs/prior Treatment) The history is provided by the patient and medical records. No language interpreter was used.     Jennifer [name] is a 55 y.o. female  with a PMH of hypertension, bipolar, depression who presents to the Emergency Department complaining of worsening lower left sided dental pain described as throbbing 3 days. Pain radiates to left ear, left eye, and down the left neck. Patient states she has taken over-the-counter ibuprofen with little relief. Patient has history of similar complaints, stating she has no insurance and financially cannot afford to go to the dentist. She is aware she needs to do this for ultimate relief. Patient is also complaining of elevated blood pressure. She states she has BP prescriptions ready to pick up at the health department on Monday morning, however has not taken her medication today. Patient denies fever, headaches, visual changes, abdominal pain, shortness of breath, difficulty swallowing.  Past Medical History  Diagnosis Date  . Fibroids     now s/p hysterectomy  . Left ventricular hypertrophy   . Polysubstance abuse     IV drug [place], cocaine on occassion, smoking and alcoholism, has been going to AA, not completely sober  . Hypertension   . bipolar   . Depression    Past Surgical History  Procedure Laterality Date  . Abdominal hysterectomy      2003  . Exploratory laparotomy      complex pelvic mass 2011  . Bilateral salpingoophorectomy      01/2010  . Dilation and curettage of uterus    . Tonsillectomy     Family History  Problem Relation Age of Onset  . Hypertension Mother   . Hypertension Sister   . Diabetes Father    Social History  Substance  Use Topics  . Smoking status: Current Every Day Smoker -- 0.50 packs/day for 16 years    Types: Cigarettes  . Smokeless tobacco: Never Used     Comment: Wants to quit.   . Alcohol Use: No     Comment: Occasional drinker   OB History    Gravida Para Term Preterm AB TAB SAB Ectopic Multiple Living   5 0 0 0 4 0 1 0 0 0      Review of Systems  Constitutional: Negative for fever and chills.  HENT: Positive for dental problem. Negative for congestion and trouble swallowing.   Eyes: Negative for visual disturbance.  Respiratory: Negative for cough and shortness of breath.   Cardiovascular: Negative.   Gastrointestinal: Negative for nausea, vomiting and abdominal pain.  Genitourinary: Negative for dysuria.  Musculoskeletal: Negative for back pain and neck stiffness.  Skin: Negative for rash.  Neurological: Negative for dizziness and headaches.      Allergies  Review of patient's allergies indicates no known allergies.  Home Medications   Prior to Admission medications   Medication Sig Start Date End Date Taking? Authorizing Provider  atenolol (TENORMIN) 50 MG tablet Take 1 tablet (50 mg total) by mouth daily. 09/27/15   Carmin Muskrat, MD  cetirizine (ZYRTEC) 10 MG tablet Take 1 tablet (10 mg total) by mouth daily. 10/02/15   Rushil Sherrye Payor, MD  fluticasone (FLONASE) 50 MCG/ACT nasal spray Place 2 sprays into  both nostrils daily. 09/05/15   Larene Pickett, PA-C  hydrochlorothiazide (MICROZIDE) 12.5 MG capsule Take 1 capsule (12.5 mg total) by mouth daily. 09/27/15   Carmin Muskrat, MD  ibuprofen (ADVIL,MOTRIN) 800 MG tablet Take 1 tablet (800 mg total) by mouth 3 (three) times daily. 08/15/15   Jola Schmidt, MD  lisinopril (PRINIVIL,ZESTRIL) 20 MG tablet Take 1 tablet (20 mg total) by mouth once. 09/27/15   Carmin Muskrat, MD  lisinopril-hydrochlorothiazide (PRINZIDE,ZESTORETIC) 20-12.5 MG per tablet Take 1 tablet by mouth daily. 03/22/15   Rushil Sherrye Payor, MD  PARoxetine (PAXIL) 20 MG  tablet Take 0.5 tablets (10 mg total) by mouth daily. 09/27/15 09/26/16  Carmin Muskrat, MD  penicillin v potassium (VEETID) 500 MG tablet Take 1 tablet (500 mg total) by mouth 4 (four) times daily. 11/03/15 11/10/15  Jaime Pilcher Ward, PA-C   BP 140/101 mmHg  Pulse 62  Temp(Src) 98 F (36.7 C) (Oral)  Resp 17  SpO2 100%  LMP 12/04/2009 Physical Exam  Constitutional: She is oriented to person, place, and time. She appears well-developed and well-nourished.  Alert and in no acute distress  HENT:  Head: Normocephalic and atraumatic.  Mouth/Throat:    Dental cavities and poor oral dentition noted, pain along tooth as depicted in image, midline uvula, no trismus, oropharynx moist and clear, no abscess noted, no oropharyngeal erythema or edema, neck supple and no tenderness. No facial edema.   Neck:  Full range of motion without pain.  Cardiovascular: Normal rate, regular rhythm and normal heart sounds.  Exam reveals no gallop and no friction rub.   No murmur heard. Pulmonary/Chest: Effort normal and breath sounds normal. No respiratory distress. She has no wheezes. She has no rales. She exhibits no tenderness.  Abdominal: Soft. She exhibits no distension. There is no tenderness.  Neurological: She is alert and oriented to person, place, and time.  Skin: Skin is warm and dry.  Nursing note and vitals reviewed.   ED Course  Procedures (including critical care time) Labs Review Labs Reviewed - No data to display  Imaging Review No results found. I have personally reviewed and evaluated these images and lab results as part of my medical decision-making.   EKG Interpretation None      MDM   Final diagnoses:  Elevated blood pressure  Dental infection   Jennifer [name] presents with dentalgia. No abscess requiring immediate incision and drainage. Patient is afebrile, non toxic appearing, and swallowing secretions well. Exam not concerning for Ludwig's angina or pharyngeal  abscess. Will treat with PenVK- first dose given in the emergency department. Dental block was offered, however patient states she has had this performed in the past but that it wears off quickly and not worth the pain of the injection. Will give one 5 mg Norco in ED-patient informed that no narcotic pain medication will be given at discharge. I gave patient dental resource guide and stressed the importance of dental follow up for ultimate management of dental pain. Patient voices understanding and is agreeable to plan.  Patient also presenting with blood pressure of 140/101. Patient has a history of hypertension and is on multiple medications for her blood pressure, stating that she ran out of her medicine and did not take it today. She has a prescription ready to pick up at the health department on Monday and agrees to do so. Will give him blood pressure medication while in ED today.  [facility] Ward, PA-C 11/03/15 1117  Lajean Saver, MD 11/03/15  1258 

## 2015-11-03 NOTE — ED Notes (Signed)
Pt c/o dental pain, left side, sts pain is severe, throbbing, and moving to her head, neck, eye and ear. She sts OTC pain relievers don't help.

## 2015-11-03 NOTE — Discharge Instructions (Signed)
You have a dental injury. It is very important that you get evaluated by a dentist as soon as possible. See the resource guide attached and schedule an appointment.Take ibuprofen as needed for pain relief. Take your full course of antibiotics. Read the instructions below.  Eat a soft or liquid diet and rinse your mouth out after meals with warm water. You should see a dentist or return here at once if you have increased swelling, increased pain or uncontrolled bleeding from the site of your injury.  SEEK MEDICAL CARE IF:   You have increased pain not controlled with medicines.   You have swelling around your tooth, in your face or neck.   You have bleeding which starts, continues, or gets worse.   You have a fever >101  If you are unable to open your mouth

## 2015-11-03 NOTE — ED Notes (Signed)
Discharge instructions, follow up care, and rx x1 reviewed with patient. Patient verbalized understanding. 

## 2015-11-05 ENCOUNTER — Encounter: Payer: Self-pay | Admitting: Internal Medicine

## 2015-11-05 ENCOUNTER — Other Ambulatory Visit: Payer: Self-pay

## 2015-11-05 ENCOUNTER — Ambulatory Visit (INDEPENDENT_AMBULATORY_CARE_PROVIDER_SITE_OTHER): Payer: No Typology Code available for payment source | Admitting: Internal Medicine

## 2015-11-05 ENCOUNTER — Encounter: Payer: Self-pay | Admitting: Licensed Clinical Social Worker

## 2015-11-05 VITALS — BP 144/93 | HR 73 | Temp 98.3°F | Ht 64.0 in | Wt 221.0 lb

## 2015-11-05 DIAGNOSIS — K219 Gastro-esophageal reflux disease without esophagitis: Secondary | ICD-10-CM

## 2015-11-05 DIAGNOSIS — K047 Periapical abscess without sinus: Secondary | ICD-10-CM

## 2015-11-05 DIAGNOSIS — F32A Depression, unspecified: Secondary | ICD-10-CM

## 2015-11-05 DIAGNOSIS — F329 Major depressive disorder, single episode, unspecified: Secondary | ICD-10-CM

## 2015-11-05 DIAGNOSIS — K0889 Other specified disorders of teeth and supporting structures: Secondary | ICD-10-CM

## 2015-11-05 MED ORDER — LISINOPRIL-HYDROCHLOROTHIAZIDE 20-12.5 MG PO TABS: 1.0000 | ORAL_TABLET | Freq: Every day | ORAL | Status: DC

## 2015-11-05 MED ORDER — PANTOPRAZOLE SODIUM 40 MG PO TBEC: 40.0000 mg | DELAYED_RELEASE_TABLET | Freq: Every day | ORAL | Status: DC

## 2015-11-05 MED ORDER — HYDROCODONE-ACETAMINOPHEN 5-325 MG PO TABS: 1.0000 | ORAL_TABLET | Freq: Four times a day (QID) | ORAL | Status: DC | PRN

## 2015-11-05 MED ORDER — ATENOLOL 50 MG PO TABS: 50.0000 mg | ORAL_TABLET | Freq: Every day | ORAL | Status: DC

## 2015-11-05 MED ORDER — PAROXETINE HCL 20 MG PO TABS: 10.0000 mg | ORAL_TABLET | Freq: Every day | ORAL | Status: DC

## 2015-11-05 MED FILL — HYDROCODON-APAP 5-325: 5-325 | 5 days supply | Qty: 20 | Fill #0

## 2015-11-05 NOTE — Patient Instructions (Signed)
Go to [employer].  Pick up the antibiotic. Take the pain medicine as needed.  Start taking protonix for your stomach.  Follow up in 1 month.

## 2015-11-05 NOTE — Assessment & Plan Note (Signed)
Patient needs to see a dentist for her poor dentition and likely current flare up of her dental caries leading to possible infection on left lower molar. She already was given penicilin K by ER which she should pick up.  - I will give her Norco for acute pain 5-325mg  #20 tabs. I encouraged her to go to [employer] to ask about urgent treatment options there and payment assistance.

## 2015-11-05 NOTE — Progress Notes (Signed)
Subjective:    [name], female    DOB: [birth year], 55 y.o.   MRN: [id number]  HPI  55 yo female with hx of HTN, bipolar, dental caries,  Here for ED follow up.  Went to ED 2 days ago with left sided dental pain for 3 days. They wanted her to see a dentist but she stated she could not afford to see one. She was given PenVK in the ED with script to be picked up. She stated she has not picked them up yet, she will go to the health dept today to pick it up. She has been taking ibuprofen without much relief. She stated that NOrco in the hospital worked well for the pain and requesting that for pain relief. She has never tried to go to [employer] school or looked at any other places for dental care (such as charity programs). No fever/chills. Has left sided facial pain with some radiation to the ear and neck.   Also asking for paxil refill. Her depression/anxiety is well controlled on this.   Continues to have some globus sensation/discomfort to swallow solid and liquid. Was told to take PPI for this in the past which she has not been doing. Asking me to send refill for PPI.   Review of Systems  Constitutional: Negative for fever and chills.  HENT: Positive for dental problem and trouble swallowing. Negative for congestion, drooling, ear discharge, facial swelling, mouth sores, rhinorrhea, sinus pressure and sore throat.   Eyes: Negative for photophobia and visual disturbance.  Respiratory: Negative for cough, choking, chest tightness, shortness of breath and wheezing.   Cardiovascular: Negative for chest pain, palpitations and leg swelling.  Gastrointestinal: Negative for nausea, abdominal pain, diarrhea and abdominal distention.  Endocrine: Negative for polyphagia and polyuria.  Genitourinary: Negative for dysuria and flank pain.  Musculoskeletal: Negative.   Skin: Negative.   Allergic/Immunologic: Negative.   Neurological: Negative for dizziness, seizures and  numbness.  Hematological: Negative.   Psychiatric/Behavioral: Negative.        Objective:   Physical Exam  Constitutional: She is oriented to person, place, and time. She appears well-developed and well-nourished. No distress.  HENT:  Head: Normocephalic and atraumatic.  Nose: Nose normal.  Mouth/Throat: Oropharynx is clear and moist. No oropharyngeal exudate.    Has poor dentition in general. Left lower molar caries noted with tenderness to palpation around this. No abscess noted. No bleeding or discharge. Has some tenderness at left TMJ.   Normal TM both ears.   Eyes: Conjunctivae and EOM are normal. Pupils are equal, round, and reactive to light. Right eye exhibits no discharge. Left eye exhibits no discharge. No scleral icterus.  Neck: Normal range of motion. No tracheal deviation present. No thyromegaly present.  Cardiovascular: Normal rate and regular rhythm.  Exam reveals no gallop and no friction rub.   No murmur heard. Pulmonary/Chest: Effort normal and breath sounds normal. No respiratory distress. She has no wheezes. She has no rales. She exhibits no tenderness.  Abdominal: Soft. Bowel sounds are normal. She exhibits no distension. There is no tenderness.  Musculoskeletal: Normal range of motion. She exhibits no edema or tenderness.  Lymphadenopathy:    She has no cervical adenopathy.  Neurological: She is alert and oriented to person, place, and time.  Skin: Skin is warm. She is not diaphoretic.     Filed Vitals:   11/05/15 1107  BP: 144/93  Pulse: 73  Temp: 98.3 F (36.8 C)  Assessment & Plan:  See problem based a&p. 

## 2015-11-05 NOTE — Progress Notes (Signed)
Ms. Bouler requesting to meet with CSW.  CSW met with pt during her scheduled [facility] appointment.  Ms. Brucato states she completed the [facility] transportation application but never heard back from them.  Pt inquiring about the [employer] transportation in the form of bus passes.  CSW informed Ms. Pinheiro this worker unaware of bus passes provided for [facility] but aware of the scheduling program.  Pt notified her application has been faxed and pt is able to contact [facility] directly for scheduling.  CSW inquired if pt in need of the phone number, pt declined and states she is in need of urgent transportation needs and would like a bus pass.  CSW provided Ms. Rockhold with bus pass home but encourages pt to utilize [facility] transportation as [employer] bus passes may not always be available.

## 2015-11-05 NOTE — Assessment & Plan Note (Signed)
Has been having continued globus sensation and some discomfort with swallowing which I suspect could be from her uncontrolled GERD. She has not been compliant with her PPI treatment which is the first line for any globus treatment.  - prescribed protonix 40mg  daily again. In the future may consider doing BID if her symptoms don't improve. May also consider EGD if remains uncontrolled.

## 2015-11-05 NOTE — Assessment & Plan Note (Signed)
Well controlled on paxil per patient. Asking for refill. Sent refill.

## 2015-11-05 NOTE — Telephone Encounter (Signed)
Called into [facility]

## 2015-11-12 NOTE — Progress Notes (Signed)
Internal Medicine Clinic Attending  Case discussed with Dr. Ahmed at the time of the visit.  We reviewed the resident's history and exam and pertinent patient test results.  I agree with the assessment, diagnosis, and plan of care documented in the resident's note. 

## 2015-12-06 ENCOUNTER — Ambulatory Visit (HOSPITAL_COMMUNITY): Payer: No Typology Code available for payment source | Admitting: Psychiatry

## 2015-12-28 ENCOUNTER — Telehealth: Payer: Self-pay | Admitting: Internal Medicine

## 2015-12-28 NOTE — Telephone Encounter (Signed)
APT. REMINDER CALL, LMTCB °

## 2015-12-31 ENCOUNTER — Ambulatory Visit: Payer: No Typology Code available for payment source | Admitting: Internal Medicine

## 2015-12-31 ENCOUNTER — Encounter: Payer: Self-pay | Admitting: Internal Medicine

## 2016-01-01 ENCOUNTER — Emergency Department (HOSPITAL_COMMUNITY)
Admission: EM | Admit: 2016-01-01 | Discharge: 2016-01-01 | Disposition: A | Discharge status: Home / Self Care | Payer: No Typology Code available for payment source | Attending: Emergency Medicine | Admitting: Emergency Medicine

## 2016-01-01 ENCOUNTER — Encounter (HOSPITAL_COMMUNITY): Payer: Self-pay | Admitting: Emergency Medicine

## 2016-01-01 DIAGNOSIS — F17210 Nicotine dependence, cigarettes, uncomplicated: Secondary | ICD-10-CM | POA: Insufficient documentation

## 2016-01-01 DIAGNOSIS — I10 Essential (primary) hypertension: Secondary | ICD-10-CM | POA: Insufficient documentation

## 2016-01-01 DIAGNOSIS — Z79899 Other long term (current) drug therapy: Secondary | ICD-10-CM | POA: Insufficient documentation

## 2016-01-01 DIAGNOSIS — R0982 Postnasal drip: Secondary | ICD-10-CM

## 2016-01-01 DIAGNOSIS — J302 Other seasonal allergic rhinitis: Secondary | ICD-10-CM

## 2016-01-01 DIAGNOSIS — F329 Major depressive disorder, single episode, unspecified: Secondary | ICD-10-CM | POA: Insufficient documentation

## 2016-01-01 DIAGNOSIS — K047 Periapical abscess without sinus: Secondary | ICD-10-CM

## 2016-01-01 MED ORDER — HYDROCODONE-ACETAMINOPHEN 5-325 MG PO TABS: ORAL_TABLET | ORAL | Status: DC

## 2016-01-01 MED ORDER — AMOXICILLIN 500 MG PO CAPS
1000.0000 mg | ORAL_CAPSULE | Freq: Once | ORAL | Status: AC
  Administered 2016-01-01: 1000 mg via ORAL
  Filled 2016-01-01: qty 2

## 2016-01-01 MED ORDER — HYDROCODONE-ACETAMINOPHEN 5-325 MG PO TABS
1.0000 | ORAL_TABLET | Freq: Once | ORAL | Status: AC
  Administered 2016-01-01: 1 via ORAL
  Filled 2016-01-01: qty 1

## 2016-01-01 MED ORDER — AMOXICILLIN 500 MG PO CAPS: 1000.0000 mg | ORAL_CAPSULE | Freq: Two times a day (BID) | ORAL | Status: DC

## 2016-01-01 MED ORDER — FLUTICASONE PROPIONATE 50 MCG/ACT NA SUSP
2.0000 | Freq: Once | NASAL | Status: AC
  Administered 2016-01-01: 2 via NASAL
  Filled 2016-01-01: qty 16

## 2016-01-01 NOTE — ED Notes (Signed)
Patient here with complaints of right, lower dental pain. Pain 10/10.

## 2016-01-01 NOTE — ED Notes (Signed)
Bed: WA26 Expected date:  Expected time:  Means of arrival:  Comments: 

## 2016-01-01 NOTE — Discharge Instructions (Signed)
Take vicodin for breakthrough pain, do not drink alcohol, drive, care for children or do other critical tasks while taking vicodin.  Return to the emergency room for fever, change in vision, redness to the face that rapidly spreads towards the eye, nausea or vomiting, difficulty swallowing or shortness of breath.   Apply warm compresses to jaw throughout the day.   Take your antibiotics as directed and to the end of the course. DO NOT drink alcohol when taking metronidazole, it will make you very sick!   Followup with a dentist is very important for ongoing evaluation and management of recurrent dental pain. Return to emergency department for emergent changing or worsening symptoms."  Low-cost dental clinic: [facility]  at [phone]**   You may also call [facility] If the dentist on-call cannot see you, please use the resources below:   Patients with Medicaid: [place] [address] [place], [place] [address] [phone]  If unable to pay, or uninsured, contact [facility] [phone]) or [facility] [phone] in [place], [place] in [facility]) to become qualified for the adult dental clinic  Other [facility]- [place], [place], [place], [address]    [phone], Ext. [phone]    2nd and 4th Thursday of the month at 6:30am    10 clients each day by appointment, can sometimes see walk-in     patients if someone does not show for an appointment [place], [place], [place], [address]    [phone] [facility]- [address], [place], [place], [address]    [phone]  [place] Department- [phone] [place] [facility] Department- [phone]

## 2016-01-01 NOTE — ED Provider Notes (Signed)
CSN: [id number]     Arrival date & time 01/01/16  1144 History   By signing my name below, I, Jennifer [name], attest that this documentation has been prepared under the direction and in the presence of non-physician practitioner, Monico Blitz, PA-C. Electronically Signed: Rowan [name], Scribe. 01/01/2016. 12:38 PM.   Chief Complaint  Patient presents with  . Dental Pain   The history is provided by the patient. No language interpreter was used.   HPI Comments:  Jennifer [name] is a 55 y.o. female with PMHx of HTN who presents to the Emergency Department complaining of 10/10 constant right lower dental pain onset 2 days ago, worsening last night. She states her tooth is infected again. Pt reports associated knot in her cheek and pain radiation down her throat. No alleviating factors noted. She has an appointment to pull the tooth June 6. Pt also reports sinus congestion and cough, and requests a refill of Flonase. She is also taking Claritin with mild relief.  Past Medical History  Diagnosis Date  . Fibroids     now s/p hysterectomy  . Left ventricular hypertrophy   . Polysubstance abuse     IV drug [place], cocaine on occassion, smoking and alcoholism, has been going to AA, not completely sober  . Hypertension   . bipolar   . Depression    Past Surgical History  Procedure Laterality Date  . Abdominal hysterectomy      2003  . Exploratory laparotomy      complex pelvic mass 2011  . Bilateral salpingoophorectomy      01/2010  . Dilation and curettage of uterus    . Tonsillectomy     Family History  Problem Relation Age of Onset  . Hypertension Mother   . Hypertension Sister   . Diabetes Father    Social History  Substance Use Topics  . Smoking status: Current Every Day Smoker -- 0.50 packs/day for 16 years    Types: Cigarettes  . Smokeless tobacco: Never Used     Comment: Wants to quit.   . Alcohol Use: No   OB History    Gravida Para Term Preterm AB TAB SAB  Ectopic Multiple Living   5 0 0 0 4 0 1 0 0 0      Review of Systems  HENT: Positive for congestion and dental problem.   Respiratory: Positive for cough.    10 systems reviewed and found to be negative, except as noted in the HPI.  Allergies  Ibuprofen  Home Medications   Prior to Admission medications   Medication Sig Start Date End Date Taking? Authorizing Provider  atenolol (TENORMIN) 50 MG tablet Take 1 tablet (50 mg total) by mouth daily. 11/05/15  Yes Sid Falcon, MD  fluticasone (FLONASE) 50 MCG/ACT nasal spray Place 2 sprays into both nostrils daily. Patient taking differently: Place 2 sprays into both nostrils daily as needed for allergies.  09/05/15  Yes Larene Pickett, PA-C  lisinopril-hydrochlorothiazide (PRINZIDE,ZESTORETIC) 20-12.5 MG tablet Take 1 tablet by mouth daily. 11/05/15  Yes Sid Falcon, MD  PARoxetine (PAXIL) 20 MG tablet Take 0.5 tablets (10 mg total) by mouth daily. 11/05/15 11/04/16 Yes Tasrif Ahmed, MD  cetirizine (ZYRTEC) 10 MG tablet Take 1 tablet (10 mg total) by mouth daily. Patient not taking: Reported on 01/01/2016 10/02/15   Riccardo Dubin, MD  HYDROcodone-acetaminophen (NORCO) 5-325 MG tablet Take 1 tablet by mouth every 6 (six) hours as needed for moderate pain. Patient not taking:  Reported on 01/01/2016 11/05/15   Dellia Nims, MD  ibuprofen (ADVIL,MOTRIN) 800 MG tablet Take 1 tablet (800 mg total) by mouth 3 (three) times daily. Patient not taking: Reported on 01/01/2016 08/15/15   Jola Schmidt, MD  pantoprazole (PROTONIX) 40 MG tablet Take 1 tablet (40 mg total) by mouth daily. Patient not taking: Reported on 01/01/2016 11/05/15 11/04/16  Dellia Nims, MD   BP 129/92 mmHg  Pulse 64  Temp(Src) 99.2 F (37.3 C) (Oral)  Resp 20  SpO2 96%  LMP 12/04/2009   Physical Exam  Constitutional: She is oriented to person, place, and time. She appears well-developed and well-nourished. No distress.  HENT:  Head: Normocephalic and atraumatic.  Mouth/Throat:  Uvula is midline and oropharynx is clear and moist. No trismus in the jaw. No uvula swelling. No oropharyngeal exudate, posterior oropharyngeal edema, posterior oropharyngeal erythema or tonsillar abscesses.    Generally poor dentition, + swelling and fluctuance as diagrammed. Patient is handling their secretions. There is no tenderness to palpation or firmness underneath tongue bilaterally. No trismus.    Eyes: Conjunctivae and EOM are normal. Right eye exhibits no discharge. Left eye exhibits no discharge.  Cardiovascular: Normal rate and regular rhythm.   Pulmonary/Chest: Effort normal. No stridor. No respiratory distress.  Musculoskeletal: Normal range of motion.  Lymphadenopathy:    She has no cervical adenopathy.  Neurological: She is alert and oriented to person, place, and time. Coordination normal.  Skin: No rash noted. She is not diaphoretic.  Psychiatric: She has a normal mood and affect. Her behavior is normal.  Nursing note and vitals reviewed.   ED Course  Procedures  DIAGNOSTIC STUDIES:  Oxygen Saturation is 96% on RA, normal by my interpretation.    COORDINATION OF CARE:  12:33 PM Will administer pain medication, start on antibiotic and give Flonase. Discussed treatment plan with pt at bedside and pt agreed to plan.  Labs Review Labs Reviewed - No data to display  Imaging Review No results found. I have personally reviewed and evaluated these images and lab results as part of my medical decision-making.   EKG Interpretation None      MDM   Final diagnoses:  Dentoalveolar abscess  Seasonal allergies  Post-nasal drip    Filed Vitals:   01/01/16 1157 01/01/16 1249  BP: 129/92   Pulse: 64 64  Temp: 99.2 F (37.3 C)   TempSrc: Oral   Resp: 20   SpO2: 96%     Medications  amoxicillin (AMOXIL) capsule 1,000 mg (1,000 mg Oral Given 01/01/16 1246)  HYDROcodone-acetaminophen (NORCO/VICODIN) 5-325 MG per tablet 1 tablet (1 tablet Oral Given 01/01/16  1246)  fluticasone (FLONASE) 50 MCG/ACT nasal spray 2 spray (2 sprays Each Nare Given 01/01/16 1248)    [name] is 55 y.o. female presenting with Dental pain and swelling consistent with dental abscess. Patient declines oral nerve block. She has an appointment with her dentist in the next 2 weeks for extraction. Patient will be started on amoxicillin and given Vicodin.   Patient is also requesting Flonase, states that she cannot afford it over-the-counter. Reports that she is taking allergy medications with cough worse at night consistent with the postnasal drip.  Evaluation does not show pathology that would require ongoing emergent intervention or inpatient treatment. Pt is hemodynamically stable and mentating appropriately. Discussed findings and plan with patient/guardian, who agrees with care plan. All questions answered. Return precautions discussed and outpatient follow up given.   Discharge Medication List as of 01/01/2016 12:42  PM    START taking these medications   Details  amoxicillin (AMOXIL) 500 MG capsule Take 2 capsules (1,000 mg total) by mouth 2 (two) times daily., Starting 01/01/2016, Until Discontinued, Print        I personally performed the services described in this documentation, which was scribed in my presence.  The recorded information has been reviewed and is accurate.    Monico Blitz, PA-C 01/01/16 [place], MD 01/02/16 [phone]

## 2016-01-16 MED FILL — HYDROCODON-APAP 5-325: 5-325 | 1 days supply | Qty: 7 | Fill #0

## 2016-02-04 ENCOUNTER — Other Ambulatory Visit: Payer: Self-pay | Admitting: *Deleted

## 2016-02-04 DIAGNOSIS — F329 Major depressive disorder, single episode, unspecified: Secondary | ICD-10-CM

## 2016-02-04 DIAGNOSIS — F32A Depression, unspecified: Secondary | ICD-10-CM

## 2016-02-05 NOTE — Telephone Encounter (Addendum)
Since I assume care as PCP, I have only seen this patient once. She has not showed up or cancelled several appointments scheduled with our clinic.  07/14/14 [cancelled] 08/16/14 [no show] 09/25/14 [no show] 10/11/14 [no show] 10/18/14 [cancelled] 10/20/14  [no show] 12/04/14 [cancelled, lack of transport] 12/08/14 [no show] 12/16/14  [no show] 07/30/15 [no show] 08/31/15 [cancelled] 09/28/15 [cancelled] 12/31/15 [no show]  I will refill cetirizine for 30 days and ask that she follow-up in late July at which time I will discuss with her why I as her PCP will not continue to refill her antidepressant given the risks of precipitating a manic episode several providers have expressed to her over the years [see below].   The fact that she is requesting a refill after completing a 60-day supply when it was last refilled in March is further consistent with my concerns.   If she fails to follow-up with mental health, I recommend we discharge her from the clinic.    05/19/12: Anxiety: Although initially very hostile and demanding, it was made clear to pt that her Bipolar and anxiety needed to be managed by [facility]. Furthermore she would not receive both anxiolytics and sleep aids. She decline Buspar in favor of re-fill of previous Ambien prescription to help her sleep.     07/17/14: Pt c/o anxiety and increased irritability. She used to be on Prozac, which did not help, per pt. She was following with [facility] but has not been seen by them in a number of months, pt unwilling to elaborate why. Of note, she was also on Lithium from them as well. Today she is requesting an as needed medication for her anxiety, which I told her she would need to get from a mental health provider. An SSRI such as sertraline, citalopram, or would be 1st line therapy for GAD; however with her h/o bipolar disorder, I am concerned about starting an SSRI and causing mania.  - Recommend that she follow up with mental  health provider for therapy for anxiety and for her bipolar disorder.    09/15/14:  -Feels that she hears voices "playing around in her head" -Reports that these voices are constantly nagging her -Does not feel that they're telling her to do things otherwise -Reports not being able to sleep for more than 30-40 minutes at a time due to her nervousness and anxiety -Used to see [facility] but no longer as she felt that they were not adequately treating her with trazodone and lithium -Would like to try Prozac or Xanax as she reports she received it from this clinic before  Assessment -Leery of starting a antipsychotic in an individual who was previously being seen by mental health -In the absence of additional information, there is a clear risk to precipitating a manic episode by starting an SSRI and an individual with a history of bipolar disorder as suggested by her prior self-report of lithium use    10/01/15: I feel her frequent utilization of the ED is in part contributed by uncontrolled bipolar disorder. I have discussed with her that she needs to be seen and treated by a psychiatrist. She reports that she does not like to see [facility] and that paxil is the only medication she needs. I have discussed that frequently we need other medications and that I would like her to see psychiatry for medication management. -Referral to psychiatry.    10/02/15: Referred to psychiatry by SW Golden Hurter on 10/02/15 but then proceeded to  no-show her appointment on 12/06/15.

## 2016-02-06 MED ORDER — CETIRIZINE HCL 10 MG PO TABS: 10.0000 mg | ORAL_TABLET | Freq: Every day | ORAL | Status: DC

## 2016-02-07 ENCOUNTER — Other Ambulatory Visit: Payer: Self-pay | Admitting: *Deleted

## 2016-02-07 DIAGNOSIS — F32A Depression, unspecified: Secondary | ICD-10-CM

## 2016-02-07 DIAGNOSIS — F329 Major depressive disorder, single episode, unspecified: Secondary | ICD-10-CM

## 2016-02-07 NOTE — Telephone Encounter (Signed)
Please see my note from yesterday for further details. I will not be refilling paroxetine until she sees me in clinic, and we discuss my concerns.

## 2016-02-07 NOTE — Telephone Encounter (Signed)
Cannot get in touch w/ pt, called [facility] and they will pass on to pt if she calls the appt day and time

## 2016-02-11 ENCOUNTER — Encounter (HOSPITAL_COMMUNITY): Payer: Self-pay

## 2016-02-11 ENCOUNTER — Emergency Department (HOSPITAL_COMMUNITY): Payer: Self-pay

## 2016-02-11 ENCOUNTER — Emergency Department (HOSPITAL_COMMUNITY)
Admission: EM | Admit: 2016-02-11 | Discharge: 2016-02-11 | Disposition: A | Discharge status: Home / Self Care | Payer: Self-pay | Attending: Emergency Medicine | Admitting: Emergency Medicine

## 2016-02-11 DIAGNOSIS — F329 Major depressive disorder, single episode, unspecified: Secondary | ICD-10-CM

## 2016-02-11 DIAGNOSIS — F17210 Nicotine dependence, cigarettes, uncomplicated: Secondary | ICD-10-CM | POA: Insufficient documentation

## 2016-02-11 DIAGNOSIS — F32A Depression, unspecified: Secondary | ICD-10-CM

## 2016-02-11 DIAGNOSIS — K219 Gastro-esophageal reflux disease without esophagitis: Secondary | ICD-10-CM

## 2016-02-11 DIAGNOSIS — K029 Dental caries, unspecified: Secondary | ICD-10-CM | POA: Insufficient documentation

## 2016-02-11 DIAGNOSIS — K1379 Other lesions of oral mucosa: Secondary | ICD-10-CM

## 2016-02-11 DIAGNOSIS — Z79899 Other long term (current) drug therapy: Secondary | ICD-10-CM | POA: Insufficient documentation

## 2016-02-11 DIAGNOSIS — I10 Essential (primary) hypertension: Secondary | ICD-10-CM | POA: Insufficient documentation

## 2016-02-11 DIAGNOSIS — F419 Anxiety disorder, unspecified: Secondary | ICD-10-CM | POA: Insufficient documentation

## 2016-02-11 LAB — CBC WITH DIFFERENTIAL/PLATELET
Basophils Absolute: 0 10*3/uL (ref 0.0–0.1)
Basophils Relative: 0 %
Eosinophils Absolute: 0.1 10*3/uL (ref 0.0–0.7)
Eosinophils Relative: 2 %
HEMATOCRIT: 49.1 % — AB (ref 36.0–46.0)
HEMOGLOBIN: 15.7 g/dL — AB (ref 12.0–15.0)
LYMPHS ABS: 1.8 10*3/uL (ref 0.7–4.0)
LYMPHS PCT: 27 %
MCH: 29.6 pg (ref 26.0–34.0)
MCHC: 32 g/dL (ref 30.0–36.0)
MCV: 92.5 fL (ref 78.0–100.0)
Monocytes Absolute: 0.3 10*3/uL (ref 0.1–1.0)
Monocytes Relative: 5 %
NEUTROS ABS: 4.4 10*3/uL (ref 1.7–7.7)
NEUTROS PCT: 66 %
Platelets: 188 10*3/uL (ref 150–400)
RBC: 5.31 MIL/uL — AB (ref 3.87–5.11)
RDW: 14.9 % (ref 11.5–15.5)
WBC: 6.6 10*3/uL (ref 4.0–10.5)

## 2016-02-11 LAB — I-STAT CHEM 8, ED
BUN: 10 mg/dL (ref 6–20)
CALCIUM ION: 1.16 mmol/L (ref 1.13–1.30)
CHLORIDE: 108 mmol/L (ref 101–111)
Creatinine, Ser: 0.8 mg/dL (ref 0.44–1.00)
GLUCOSE: 92 mg/dL (ref 65–99)
HCT: 51 % — ABNORMAL HIGH (ref 36.0–46.0)
Hemoglobin: 17.3 g/dL — ABNORMAL HIGH (ref 12.0–15.0)
Potassium: 4.6 mmol/L (ref 3.5–5.1)
Sodium: 143 mmol/L (ref 135–145)
TCO2: 25 mmol/L (ref 0–100)

## 2016-02-11 MED ORDER — ATENOLOL 50 MG PO TABS
50.0000 mg | ORAL_TABLET | Freq: Once | ORAL | Status: AC
  Administered 2016-02-11: 50 mg via ORAL
  Filled 2016-02-11: qty 1

## 2016-02-11 MED ORDER — PANTOPRAZOLE SODIUM 40 MG PO TBEC: 40.0000 mg | DELAYED_RELEASE_TABLET | Freq: Every day | ORAL | Status: DC

## 2016-02-11 MED ORDER — LISINOPRIL 20 MG PO TABS
20.0000 mg | ORAL_TABLET | Freq: Once | ORAL | Status: AC
  Administered 2016-02-11: 20 mg via ORAL
  Filled 2016-02-11: qty 1

## 2016-02-11 MED ORDER — PAROXETINE HCL 20 MG PO TABS: 10.0000 mg | ORAL_TABLET | Freq: Every day | ORAL | Status: DC

## 2016-02-11 MED ORDER — KETOROLAC TROMETHAMINE 30 MG/ML IJ SOLN: 30.0000 mg | Freq: Once | INTRAMUSCULAR | Status: DC

## 2016-02-11 MED ORDER — HYDROCHLOROTHIAZIDE 12.5 MG PO CAPS
12.5000 mg | ORAL_CAPSULE | Freq: Every day | ORAL | Status: DC
  Administered 2016-02-11: 12.5 mg via ORAL
  Filled 2016-02-11: qty 1

## 2016-02-11 MED ORDER — CETIRIZINE HCL 10 MG PO TABS: 10.0000 mg | ORAL_TABLET | Freq: Every day | ORAL | Status: DC

## 2016-02-11 MED ORDER — MORPHINE SULFATE (PF) 4 MG/ML IV SOLN
4.0000 mg | Freq: Once | INTRAVENOUS | Status: AC
  Administered 2016-02-11: 4 mg via INTRAVENOUS
  Filled 2016-02-11: qty 1

## 2016-02-11 MED ORDER — SODIUM CHLORIDE 0.9 % IV BOLUS (SEPSIS)
500.0000 mL | Freq: Once | INTRAVENOUS | Status: AC
  Administered 2016-02-11: 500 mL via INTRAVENOUS

## 2016-02-11 MED ORDER — LIDOCAINE VISCOUS 2 % MT SOLN
15.0000 mL | Freq: Once | OROMUCOSAL | Status: AC
  Administered 2016-02-11: 15 mL via OROMUCOSAL
  Filled 2016-02-11: qty 15

## 2016-02-11 MED ORDER — MAGIC MOUTHWASH W/LIDOCAINE: 10.0000 mL | Freq: Four times a day (QID) | ORAL | Status: DC | PRN

## 2016-02-11 MED ORDER — LORAZEPAM 2 MG/ML IJ SOLN
1.0000 mg | Freq: Once | INTRAMUSCULAR | Status: AC
  Administered 2016-02-11: 1 mg via INTRAVENOUS
  Filled 2016-02-11: qty 1

## 2016-02-11 MED ORDER — IOPAMIDOL (ISOVUE-300) INJECTION 61%
INTRAVENOUS | Status: AC
  Administered 2016-02-11: 75 mL
  Filled 2016-02-11: qty 75

## 2016-02-11 MED ORDER — HYDROMORPHONE HCL 1 MG/ML IJ SOLN
1.0000 mg | Freq: Once | INTRAMUSCULAR | Status: AC
  Administered 2016-02-11: 1 mg via INTRAVENOUS
  Filled 2016-02-11: qty 1

## 2016-02-11 MED ORDER — ONDANSETRON HCL 4 MG/2ML IJ SOLN
4.0000 mg | Freq: Once | INTRAMUSCULAR | Status: AC
  Administered 2016-02-11: 4 mg via INTRAVENOUS
  Filled 2016-02-11: qty 2

## 2016-02-11 MED ORDER — MORPHINE SULFATE (PF) 4 MG/ML IV SOLN: 4.0000 mg | Freq: Once | INTRAVENOUS | Status: DC

## 2016-02-11 MED ORDER — KETOROLAC TROMETHAMINE 30 MG/ML IJ SOLN
15.0000 mg | Freq: Once | INTRAMUSCULAR | Status: AC
  Administered 2016-02-11: 15 mg via INTRAVENOUS
  Filled 2016-02-11: qty 1

## 2016-02-11 MED ORDER — FLUTICASONE PROPIONATE 50 MCG/ACT NA SUSP: 2.0000 | Freq: Every day | NASAL | Status: DC | PRN

## 2016-02-11 NOTE — ED Notes (Addendum)
Pt demanding percocet before she leaves.  Pt wants [facility] [place] back to bedside

## 2016-02-11 NOTE — ED Notes (Signed)
Pt ambulatory w/ steady gait to restroom. 

## 2016-02-11 NOTE — Discharge Instructions (Signed)
Read the information below.  Use the prescribed medication as directed.  Please discuss all new medications with your pharmacist.  You may return to the Emergency Department at any time for worsening condition or any new symptoms that concern you.  If you develop high fevers, facial swelling, difficulty swallowing or breathing, or you are unable to tolerate fluids by mouth, return to the ER immediately for a recheck.   Have your blood pressure rechecked within the next week.     Food Choices for Gastroesophageal Reflux Disease, Adult When you have gastroesophageal reflux disease (GERD), the foods you eat and your eating habits are very important. Choosing the right foods can help ease your discomfort.  WHAT GUIDELINES DO I NEED TO FOLLOW?   Choose fruits, vegetables, whole grains, and low-fat dairy products.   Choose low-fat meat, fish, and poultry.  Limit fats such as oils, salad dressings, butter, nuts, and avocado.   Keep a food diary. This helps you identify foods that cause symptoms.   Avoid foods that cause symptoms. These may be different for everyone.   Eat small meals often instead of 3 large meals a day.   Eat your meals slowly, in a place where you are relaxed.   Limit fried foods.   Cook foods using methods other than frying.   Avoid drinking alcohol.   Avoid drinking large amounts of liquids with your meals.   Avoid bending over or lying down until 2-3 hours after eating.  WHAT FOODS ARE NOT RECOMMENDED?  These are some foods and drinks that may make your symptoms worse: Vegetables Tomatoes. Tomato juice. Tomato and spaghetti sauce. Chili peppers. Onion and garlic. Horseradish. Fruits Oranges, grapefruit, and lemon (fruit and juice). Meats High-fat meats, fish, and poultry. This includes hot dogs, ribs, ham, sausage, salami, and bacon. Dairy Whole milk and chocolate milk. Sour cream. Cream. Butter. Ice cream. Cream cheese.  Drinks Coffee and tea.  Bubbly (carbonated) drinks or energy drinks. Condiments Hot sauce. Barbecue sauce.  Sweets/Desserts Chocolate and cocoa. Donuts. Peppermint and spearmint. Fats and Oils High-fat foods. This includes [place] fries and potato chips. Other Vinegar. Strong spices. This includes black pepper, white pepper, red pepper, cayenne, curry powder, cloves, ginger, and chili powder. The items listed above may not be a complete list of foods and drinks to avoid. Contact your dietitian for more information.   This information is not intended to replace advice given to you by your health care provider. Make sure you discuss any questions you have with your health care provider.   Document Released: 01/27/2012 Document Revised: 08/18/2014 Document Reviewed: 06/01/2013 Elsevier Interactive Patient Education 2016 [employer].  How to Take Your Blood Pressure HOW DO I GET A BLOOD PRESSURE MACHINE?  You can buy an electronic home blood pressure machine at your local pharmacy. Insurance will sometimes cover the cost if you have a prescription.  Ask your doctor what type of machine is best for you. There are different machines for your arm and your wrist.  If you decide to buy a machine to check your blood pressure on your arm, first check the size of your arm so you can buy the right size cuff. To check the size of your arm:   Use a measuring tape that shows both inches and centimeters.   Wrap the measuring tape around the upper-middle part of your arm. You may need someone to help you measure.   Write down your arm measurement in both inches and centimeters.  To measure your blood pressure correctly, it is important to have the right size cuff.   If your arm is up to 13 inches (up to 34 centimeters), get an adult cuff size.  If your arm is 13 to 17 inches (35 to 44 centimeters), get a large adult cuff size.    If your arm is 17 to 20 inches (45 to 52 centimeters), get an adult thigh cuff.   WHAT DO THE NUMBERS MEAN?   There are two numbers that make up your blood pressure. For example: 120/80.  The first number (120 in our example) is called the "systolic pressure." It is a measure of the pressure in your blood vessels when your heart is pumping blood.  The second number (80 in our example) is called the "diastolic pressure." It is a measure of the pressure in your blood vessels when your heart is resting between beats.  Your doctor will tell you what your blood pressure should be. WHAT SHOULD I DO BEFORE I CHECK MY BLOOD PRESSURE?   Try to rest or relax for at least 30 minutes before you check your blood pressure.  Do not smoke.  Do not have any drinks with caffeine, such as:  Soda.  Coffee.  Tea.  Check your blood pressure in a quiet room.  Sit down and stretch out your arm on a table. Keep your arm at about the level of your heart. Let your arm relax.  Make sure that your legs are not crossed. HOW DO I CHECK MY BLOOD PRESSURE?  Follow the directions that came with your machine.  Make sure you remove any tight-fitting clothing from your arm or wrist. Wrap the cuff around your upper arm or wrist. You should be able to fit a finger between the cuff and your arm. If you cannot fit a finger between the cuff and your arm, it is too tight and should be removed and rewrapped.  Some units require you to manually pump up the arm cuff.  Automatic units inflate the cuff when you press a button.  Cuff deflation is automatic in both models.  After the cuff is inflated, the unit measures your blood pressure and pulse. The readings are shown on a monitor. Hold still and breathe normally while the cuff is inflated.  Getting a reading takes less than a minute.  Some models store readings in a memory. Some provide a printout of readings. If your machine does not store your readings, keep a written record.  Take readings with you to your next visit with your doctor.    This information is not intended to replace advice given to you by your health care provider. Make sure you discuss any questions you have with your health care provider.   Document Released: 07/10/2008 Document Revised: 08/18/2014 Document Reviewed: 09/22/2013 Elsevier Interactive Patient Education 2016 [employer]  Hypertension Hypertension is another name for high blood pressure. High blood pressure forces your heart to work harder to pump blood. A blood pressure reading has two numbers, which includes a higher number over a lower number (example: 110/72). HOME CARE   Have your blood pressure rechecked by your doctor.  Only take medicine as told by your doctor. Follow the directions carefully. The medicine does not work as well if you skip doses. Skipping doses also puts you at risk for problems.  Do not smoke.  Monitor your blood pressure at home as told by your doctor. GET HELP IF:  You think you  are having a reaction to the medicine you are taking.  You have repeat headaches or feel dizzy.  You have puffiness (swelling) in your ankles.  You have trouble with your vision. GET HELP RIGHT AWAY IF:   You get a very bad headache and are confused.  You feel weak, numb, or faint.  You get chest or belly (abdominal) pain.  You throw up (vomit).  You cannot breathe very well. MAKE SURE YOU:   Understand these instructions.  Will watch your condition.  Will get help right away if you are not doing well or get worse.   This information is not intended to replace advice given to you by your health care provider. Make sure you discuss any questions you have with your health care provider.   Document Released: 01/14/2008 Document Revised: 08/02/2013 Document Reviewed: 05/20/2013 Elsevier Interactive Patient Education [employer].

## 2016-02-11 NOTE — ED Notes (Signed)
patient here with jaw pain, neck pain, after having filling done 3 weeks ago. Has been taken lots of ibuprofen with no relif

## 2016-02-11 NOTE — ED Notes (Signed)
Patient became loud and demanding immediate care in waiting room. Security called as patient walked into treatment areas continuing to demand care. Patient alert and oriented here for dental pain, states my BP is escalating

## 2016-02-11 NOTE — ED Provider Notes (Signed)
CSN: [id number]     Arrival date & time 02/11/16  1019 History   First MD Initiated Contact with Patient 02/11/16 1058     Chief Complaint  Patient presents with  . Dental Pain  . Jaw Pain     (Consider location/radiation/quality/duration/timing/severity/associated sxs/prior Treatment) The history is provided by the patient.     Pt states she has had left lower dental pain for over a month and now has severe pain across her entire lower jaw, in her nose, ears, throat, neck, and head. Associated N/V. Subjective fevers.  All of the pain feels related to her tooth.  Pain wakes her up at night.  Dentist put a temporary filling in the left lower molar and prescribed ibuprofen, which pt states she is allergic to.  Denies CP, SOB.   Pt has been out of her home antihypertensives x 2 days, requests home dose.    Past Medical History  Diagnosis Date  . Fibroids     now s/p hysterectomy  . Left ventricular hypertrophy   . Polysubstance abuse     IV drug [place], cocaine on occassion, smoking and alcoholism, has been going to AA, not completely sober  . Hypertension   . bipolar   . Depression    Past Surgical History  Procedure Laterality Date  . Abdominal hysterectomy      2003  . Exploratory laparotomy      complex pelvic mass 2011  . Bilateral salpingoophorectomy      01/2010  . Dilation and curettage of uterus    . Tonsillectomy     Family History  Problem Relation Age of Onset  . Hypertension Mother   . Hypertension Sister   . Diabetes Father    Social History  Substance Use Topics  . Smoking status: Current Every Day Smoker -- 0.50 packs/day for 16 years    Types: Cigarettes  . Smokeless tobacco: Never Used     Comment: Wants to quit.   . Alcohol Use: No   OB History    Gravida Para Term Preterm AB TAB SAB Ectopic Multiple Living   5 0 0 0 4 0 1 0 0 0      Review of Systems  All other systems reviewed and are negative.     Allergies  Ibuprofen  Home Medications    Prior to Admission medications   Medication Sig Start Date End Date Taking? Authorizing Provider  amoxicillin (AMOXIL) 500 MG capsule Take 2 capsules (1,000 mg total) by mouth 2 (two) times daily. 01/01/16   Nicole Pisciotta, PA-C  atenolol (TENORMIN) 50 MG tablet Take 1 tablet (50 mg total) by mouth daily. 11/05/15   Sid Falcon, MD  cetirizine (ZYRTEC) 10 MG tablet Take 1 tablet (10 mg total) by mouth daily. 02/06/16   Riccardo Dubin, MD  fluticasone (FLONASE) 50 MCG/ACT nasal spray Place 2 sprays into both nostrils daily. Patient taking differently: Place 2 sprays into both nostrils daily as needed for allergies.  09/05/15   Larene Pickett, PA-C  HYDROcodone-acetaminophen (NORCO/VICODIN) 5-325 MG tablet Take 1-2 tablets by mouth every 6 hours as needed for pain and/or cough. 01/01/16   Nicole Pisciotta, PA-C  ibuprofen (ADVIL,MOTRIN) 800 MG tablet Take 1 tablet (800 mg total) by mouth 3 (three) times daily. Patient not taking: Reported on 01/01/2016 08/15/15   Jola Schmidt, MD  lisinopril-hydrochlorothiazide (PRINZIDE,ZESTORETIC) 20-12.5 MG tablet Take 1 tablet by mouth daily. 11/05/15   Sid Falcon, MD  pantoprazole (PROTONIX) 40 MG  tablet Take 1 tablet (40 mg total) by mouth daily. Patient not taking: Reported on 01/01/2016 11/05/15 11/04/16  Dellia Nims, MD  PARoxetine (PAXIL) 20 MG tablet Take 0.5 tablets (10 mg total) by mouth daily. 11/05/15 11/04/16  Tasrif Ahmed, MD   BP 156/105 mmHg  Pulse 74  Temp(Src) 98.5 F (36.9 C) (Oral)  Resp 17  Ht 5\' 4"  (1.626 m)  Wt 94.484 kg  BMI 35.74 kg/m2  LMP 12/04/2009 Physical Exam  Constitutional: She appears well-developed and well-nourished. No distress.  Crying, uncomfortable appearing  HENT:  Head: Normocephalic and atraumatic.  Right Ear: Tympanic membrane and ear canal normal.  Left Ear: Tympanic membrane and ear canal normal.  Nose: Mucosal edema present.  Mouth/Throat: Uvula is midline and oropharynx is clear and moist. Mucous  membranes are not dry. No trismus in the jaw. Dental caries present. No dental abscesses or uvula swelling. No oropharyngeal exudate, posterior oropharyngeal edema, posterior oropharyngeal erythema or tonsillar abscesses.  Right lower molars with severe decay.  Left lower 2nd molar with white filling in place.  Tenderness to palpation throughout lower dentition and lower gingiva.  No oral lesions noted.  No facial swelling.    Neck: Trachea normal, normal range of motion and phonation normal. Neck supple. No tracheal tenderness present. No rigidity. No tracheal deviation, no edema, no erythema and normal range of motion present.  Cardiovascular: Normal rate.   Pulmonary/Chest: Effort normal and breath sounds normal. No stridor.  Lymphadenopathy:    She has no cervical adenopathy.  Neurological: She is alert.  Skin: She is not diaphoretic.  Nursing note and vitals reviewed.   ED Course  Procedures (including critical care time) Labs Review Labs Reviewed  CBC WITH DIFFERENTIAL/PLATELET - Abnormal; Notable for the following:    RBC 5.31 (*)    Hemoglobin 15.7 (*)    HCT 49.1 (*)    All other components within normal limits  I-STAT CHEM 8, ED - Abnormal; Notable for the following:    Hemoglobin 17.3 (*)    HCT 51.0 (*)    All other components within normal limits    Imaging Review Ct Soft Tissue Neck W Contrast  02/11/2016  CLINICAL DATA:  Dental in left neck pain.  Data filling 3 weeks ago. EXAM: CT NECK WITH CONTRAST TECHNIQUE: Multidetector CT imaging of the neck was performed using the standard protocol following the bolus administration of intravenous contrast. CONTRAST:  75 cc Isovue 300 intravenous COMPARISON:  Face CT 06/27/2010 FINDINGS: No convincing odontogenic inflammation. No abscess or deep tissue inflammation. Pharynx and larynx: No suspicious asymmetry or enhancement. Salivary glands: Few scattered stones in the bilateral parotid. No acute inflammation or obstruction. Thyroid:  Negative Lymph nodes: None enlarged or necrotic appearing Vascular: Negative Limited intracranial: Negative Visualized orbits:  Negative. Mastoids and visualized paranasal sinuses: Clear Skeleton: Numerous devitalized teeth with notable periapical erosion around the right lower second molar. History of recent dental visit. Upper chest: Partly seen patulous esophagus with fluid level. IMPRESSION: 1. No acute finding.  No soft tissue inflammation or abscess. 2. Patulous esophagus with fluid level. Suggest outpatient esophagram. Electronically Signed   By: Monte Fantasia M.D.   On: 02/11/2016 13:04   I have personally reviewed and evaluated these images and lab results as part of my medical decision-making.   EKG Interpretation None      MDM   Final diagnoses:  Oral pain  Essential hypertension  Anxiety  Dental caries    Afebrile nontoxic patient with dental  discomfort that has escalated to include her ears, nose, throat, neck. Labs reassuring, CT neck with contrast also without acute finding.  Pt does have changes in her esophagus on CT - was previously on protonix but does not currently take it.  On exam I see multiple caries without noted abscess.  She has some symmetric mucosal edema in her nose. She has not been taking her allergy medications - claritin (per pt) or flonase.   Pt states she is allergic to ibuprofen (hives) but has been taking 800mg  motrin for the past few weeks and only complains that it doesn't work.   I do not see any concerning emergent pathology currently and I believe pt is safe for discharge with dental and PCP follow up.  Upon reviewing the results with the patient she became very angry that I was not going to discharge her with a prescription for percocet or vicodin.  We discussed the proper use of narcotics at length and pt was very unhappy with my decision to not treat her pain with narcotics.  We discussed the various prescriptions she was going to receive - she  requested refills of her allergy medications, short course of her paxil, as well as protonix for her untreated reflux and magic mouthwash with lidocaine for her mouth and gingival pain.  She states she has prescriptions for her antihypertensives that she can pick up from the health department for free.  Discussed result, findings, treatment, and follow up  with patient.  Pt given return precautions.    Clayton Bibles, PA-C 02/11/16 [place], MD 02/12/16 [phone]

## 2016-02-11 NOTE — ED Notes (Signed)
Spoke with PA [place] concerning pt's request.  PA advises pt wasn't receiving any narcotic rx.

## 2016-02-11 NOTE — ED Notes (Signed)
Patient denies pain and is resting comfortably.  

## 2016-02-13 ENCOUNTER — Ambulatory Visit (INDEPENDENT_AMBULATORY_CARE_PROVIDER_SITE_OTHER): Payer: Self-pay | Admitting: Internal Medicine

## 2016-02-13 VITALS — BP 172/96 | HR 64 | Temp 98.1°F | Ht 64.0 in | Wt 209.8 lb

## 2016-02-13 DIAGNOSIS — F329 Major depressive disorder, single episode, unspecified: Secondary | ICD-10-CM

## 2016-02-13 DIAGNOSIS — F32A Depression, unspecified: Secondary | ICD-10-CM

## 2016-02-13 DIAGNOSIS — K029 Dental caries, unspecified: Secondary | ICD-10-CM

## 2016-02-13 DIAGNOSIS — K219 Gastro-esophageal reflux disease without esophagitis: Secondary | ICD-10-CM

## 2016-02-13 DIAGNOSIS — I10 Essential (primary) hypertension: Secondary | ICD-10-CM

## 2016-02-13 MED ORDER — HYDROCODONE-ACETAMINOPHEN 5-325 MG PO TABS
ORAL_TABLET | ORAL | Status: DC
Start: 2016-02-13 — End: 2016-05-30

## 2016-02-13 MED ORDER — PAROXETINE HCL 20 MG PO TABS: 10.0000 mg | ORAL_TABLET | Freq: Every day | ORAL | Status: DC

## 2016-02-13 NOTE — Assessment & Plan Note (Signed)
BP Readings from Last 3 Encounters:  02/13/16 172/96  02/11/16 161/117  01/01/16 129/92   A: patient is non-adherent to her BP regimen of lisinopril-HCTZ 20-12.5 and atenolol 50mg  daily.  She reports that she needs to pick up her prescription from the health department and will do so tomorrow.  Additionally, her BP is also likely elevated secondary to her dental pain and anxiety regarding her reflux problems.  P:  - continue current medications as difficult to make adjustments if she is not taking her medications - pain control for her dental pain - reassurance for her reflux - follow up with PCP for further BP managment

## 2016-02-13 NOTE — Progress Notes (Signed)
Medicine attending: Medical history, presenting problems, physical findings, and medications, reviewed with resident physician Dr Andrew Wallace on the day of the patient visit and I concur with his evaluation and management plan. 

## 2016-02-13 NOTE — Patient Instructions (Signed)
Thank you for coming to see me today. It was a pleasure. Today we talked about:   Tooth Pain: I have provided you with a pain medication to treat your pain until you can see your dentist.  This is a 1 time only prescription.  Blood Pressure: Pick up your prescriptions and take as prescribed.  Your blood pressure should do well with treating your pain as well.  Sore Throat: Please take Protonix daily to control your symptoms.  If not controlled in a few weeks, we will order the esophagram for you.  Please follow-up with your Primary Doctor in 4-6 weeks  If you have any questions or concerns, please do not hesitate to call the office at [phone].  Take Care,   Jule Ser, DO

## 2016-02-13 NOTE — Progress Notes (Signed)
[name], female   DOB: [birth year], 55 y.o.   MRN: [id number]   CC: "my tooth is killing me, I can't stand it anymore" HPI: [name] is a 55 y.o. female with PMHx as detailed below who presents today for tooth pain and reflux symptoms.  Seen in the ED on 02/11/2015 for similar complaints and discharged home to follow up with PCP and dentist.  Please see A&P for status of conditions addressed today.    Past Medical History  Diagnosis Date  . Fibroids     now s/p hysterectomy  . Left ventricular hypertrophy   . Polysubstance abuse     IV drug [place], cocaine on occassion, smoking and alcoholism, has been going to AA, not completely sober  . Hypertension   . bipolar   . Depression    Review of Systems: Review of Systems  Constitutional: Negative for fever and chills.  HENT: Positive for ear pain.        + tooth pain  Respiratory: Positive for cough. Negative for sputum production and shortness of breath.   Cardiovascular: Negative for chest pain.  Gastrointestinal: Positive for heartburn and vomiting.    Physical Exam: Filed Vitals:   02/13/16 0832  BP: 172/96  Pulse: 64  Temp: 98.1 F (36.7 C)  TempSrc: Oral  Height: 5\' 4"  (1.626 m)  Weight: 209 lb 12.8 oz (95.165 kg)  SpO2: 99%   Physical Exam  Constitutional: She is oriented to person, place, and time and well-developed, well-nourished, and in no distress.  HENT:  Head: Normocephalic and atraumatic.  Multiple dental caries, evidence of recent temporary filling on left lower molar.  No appreciable abscess.  Eyes: EOM are normal.  Neck: Normal range of motion. Neck supple. No thyromegaly present.  Cardiovascular: Normal rate and regular rhythm.   Pulmonary/Chest: Effort normal.  Abdominal: Soft. There is no tenderness.  Lymphadenopathy:    She has no cervical adenopathy.  Neurological: She is alert and oriented to person, place, and time.  Psychiatric:  Pressured speech     Assessment  & Plan:  See encounters tab for problem based medical decision making. Patient discussed with Dr. Beryle Beams  ESSENTIAL HYPERTENSION, BENIGN BP Readings from Last 3 Encounters:  02/13/16 172/96  02/11/16 161/117  01/01/16 129/92   A: patient is non-adherent to her BP regimen of lisinopril-HCTZ 20-12.5 and atenolol 50mg  daily.  She reports that she needs to pick up her prescription from the health department and will do so tomorrow.  Additionally, her BP is also likely elevated secondary to her dental pain and anxiety regarding her reflux problems.  P:  - continue current medications as difficult to make adjustments if she is not taking her medications - pain control for her dental pain - reassurance for her reflux - follow up with PCP for further BP managment  Pain due to dental caries A: Patient seen in the ED on July 3 for dental pain.  Recently seen by dentist and had temporary filling done on left lower molar.  Scheduled for return visit on July 20.  She had CT neck at ED visit that showed devitalized teeth with periapical erosion.  There was no note of abscess and I cannot appreciate one on visual inspection of her mouth.  Her mouth has poor dentition with multiple caries noted.  She reports using Ibuprofen for pain that is ineffective and has gone through 3 bottles in the past 3 weeks.  P: - I encouraged her  to follow up with her dentist as planned - I gave her Norco 5-325mg  #21 tablets for her acute pain given her overuse of ibuprofen that has been ineffective.  In referencing the [place] Database, it appears she has filled a prescription for this in early June for #7 tablets - I counseled her that this was a one-time prescription. - RTC if symptoms worsen or develops signs of infection such as fever, chills, abscess formation  GERD (gastroesophageal reflux disease) A:  Patient concerned about her reflux symptoms not being controlled and recent findings on CT neck being read as  "patulous esophagous with fluid level" which likely represents the presence of reflux.  She has been non-adherent to PPI stating she took it a couple times but stopped when her symptoms did not improve.  She reports a sore throat, occasional nausea and vomiting, as well as dry cough worse at night when lying down.  She also reports globus sensation and discomfort with swallowing that I believe is due to her uncontrolled GERD and possible NSAID gastritis  P: - discussed taking PPI regularly.  She states she has about 3 bottles leftover and does not need new prescription - continue Protonix daily for 4-6 weeks.  If symptoms do not improve, consider BID dosing - if symptoms continue to persist, consider esophagram vs EGD evaluation to look for gastritis and/or H. Pylori testing  Depression A:  Patient requesting refill of her Paxil  P: - rx given to patient

## 2016-02-13 NOTE — Assessment & Plan Note (Signed)
A:  Patient requesting refill of her Paxil  P: - rx given to patient

## 2016-02-13 NOTE — Assessment & Plan Note (Signed)
A: Patient seen in the ED on July 3 for dental pain.  Recently seen by dentist and had temporary filling done on left lower molar.  Scheduled for return visit on July 20.  She had CT neck at ED visit that showed devitalized teeth with periapical erosion.  There was no note of abscess and I cannot appreciate one on visual inspection of her mouth.  Her mouth has poor dentition with multiple caries noted.  She reports using Ibuprofen for pain that is ineffective and has gone through 3 bottles in the past 3 weeks.  P: - I encouraged her to follow up with her dentist as planned - I gave her Norco 5-325mg  #21 tablets for her acute pain given her overuse of ibuprofen that has been ineffective.  In referencing the [place] Database, it appears she has filled a prescription for this in early June for #7 tablets - I counseled her that this was a one-time prescription. - RTC if symptoms worsen or develops signs of infection such as fever, chills, abscess formation

## 2016-02-13 NOTE — Assessment & Plan Note (Signed)
A:  Patient concerned about her reflux symptoms not being controlled and recent findings on CT neck being read as "patulous esophagous with fluid level" which likely represents the presence of reflux.  She has been non-adherent to PPI stating she took it a couple times but stopped when her symptoms did not improve.  She reports a sore throat, occasional nausea and vomiting, as well as dry cough worse at night when lying down.  She also reports globus sensation and discomfort with swallowing that I believe is due to her uncontrolled GERD and possible NSAID gastritis  P: - discussed taking PPI regularly.  She states she has about 3 bottles leftover and does not need new prescription - continue Protonix daily for 4-6 weeks.  If symptoms do not improve, consider BID dosing - if symptoms continue to persist, consider esophagram vs EGD evaluation to look for gastritis and/or H. Pylori testing

## 2016-03-05 ENCOUNTER — Ambulatory Visit: Payer: No Typology Code available for payment source

## 2016-03-05 ENCOUNTER — Telehealth: Payer: Self-pay | Admitting: Internal Medicine

## 2016-03-05 ENCOUNTER — Encounter: Payer: Self-pay | Admitting: Internal Medicine

## 2016-03-05 NOTE — Telephone Encounter (Signed)
I spoke with the health department who told me she had refilled this medication on 02/14/16 for 10 tablets which should last her 20 days since she takes 1/2 tablet. No refills are on file for her per the records which is in contrast to what was last documented. It is unclear if she did not receive a full supply due to cost or limited supply at the health department.  I expressed my concerns to the health department and asked them to either ask her to call the clinic back directly or have the health department speak with triage RN Bonnita Nasuti, especially since she will be due to refill her medication soon.

## 2016-03-17 MED FILL — HYDROCODON-APAP 5-325: 5-325 | 2 days supply | Qty: 21 | Fill #0

## 2016-03-21 ENCOUNTER — Telehealth (HOSPITAL_BASED_OUTPATIENT_CLINIC_OR_DEPARTMENT_OTHER): Payer: Self-pay | Admitting: Emergency Medicine

## 2016-03-21 ENCOUNTER — Telehealth: Payer: Self-pay | Admitting: *Deleted

## 2016-03-21 NOTE — Telephone Encounter (Signed)
Thank you for letting me know. She is to follow-up with me this month but has not done so as of yet. We have asked the pharmacy to let her know she needs to get in touch with [place] about making an appointment with me as PCP.

## 2016-03-21 NOTE — Telephone Encounter (Signed)
Patient presented to pharmacy with a written RX from ED dated for 12/2015 for amoxicillin. Stated that she had an infection and needed it filled. Did not tell pharmacy what type of infection and left. Attempted to contact patient with no answer, unable to leave message.

## 2016-04-11 ENCOUNTER — Encounter (HOSPITAL_COMMUNITY): Payer: Self-pay | Admitting: *Deleted

## 2016-04-11 ENCOUNTER — Telehealth: Payer: Self-pay | Admitting: *Deleted

## 2016-04-11 ENCOUNTER — Emergency Department (HOSPITAL_COMMUNITY)
Admission: EM | Admit: 2016-04-11 | Discharge: 2016-04-11 | Disposition: A | Discharge status: Home / Self Care | Payer: Self-pay | Attending: Emergency Medicine | Admitting: Emergency Medicine

## 2016-04-11 DIAGNOSIS — F17210 Nicotine dependence, cigarettes, uncomplicated: Secondary | ICD-10-CM | POA: Insufficient documentation

## 2016-04-11 DIAGNOSIS — I10 Essential (primary) hypertension: Secondary | ICD-10-CM | POA: Insufficient documentation

## 2016-04-11 DIAGNOSIS — K0889 Other specified disorders of teeth and supporting structures: Secondary | ICD-10-CM | POA: Insufficient documentation

## 2016-04-11 DIAGNOSIS — Z79899 Other long term (current) drug therapy: Secondary | ICD-10-CM | POA: Insufficient documentation

## 2016-04-11 DIAGNOSIS — Z791 Long term (current) use of non-steroidal anti-inflammatories (NSAID): Secondary | ICD-10-CM | POA: Insufficient documentation

## 2016-04-11 MED ORDER — AMOXICILLIN 500 MG PO CAPS
500.0000 mg | ORAL_CAPSULE | Freq: Once | ORAL | Status: AC
  Administered 2016-04-11: 500 mg via ORAL
  Filled 2016-04-11: qty 1

## 2016-04-11 MED ORDER — GI COCKTAIL ~~LOC~~
30.0000 mL | Freq: Once | ORAL | Status: AC
  Administered 2016-04-11: 30 mL via ORAL
  Filled 2016-04-11: qty 30

## 2016-04-11 MED ORDER — AMOXICILLIN 500 MG PO CAPS: 500.0000 mg | ORAL_CAPSULE | Freq: Three times a day (TID) | ORAL | 0 refills | Status: DC

## 2016-04-11 NOTE — ED Provider Notes (Signed)
[facility] DEPT Provider Note   CSN: [id number] Arrival date & time: 04/11/16  0830     History   Chief Complaint Chief Complaint  Patient presents with  . Dental Pain    HPI Jennifer [name] is a 55 y.o. female.  He presents for evaluation of left lower tooth pain. She states that she went to a dentist "a few months ago". She states that the dentist put a filling in her left lower tooth. She states "it's gone downhill from there". She is quite evasive about saying whether or not she has seen this dentist since then. She states "she didn't even have a  to come out and see me when I showed up at her office".  Since seen here 01/01/2016.  She underwent incision and drainage of a gingival abscess. Had several maintenance medications refilled.  Again seen 02/11/2016. Underwent CT scan of her face and neck. Again had multiple medicine refill request. Had a negative CT. Seen at the resident clinic on [facility]. Was given a prescription for pain medication. This was noted in the chart that this was to be a "one time prescription". She had an appointment on July 20 which she did not keep to see her dentist.  Patient presents by ambulance today.       HPI  Past Medical History:  Diagnosis Date  . bipolar   . Depression   . Fibroids    now s/p hysterectomy  . Hypertension   . Left ventricular hypertrophy   . Polysubstance abuse    IV drug [place], cocaine on occassion, smoking and alcoholism, has been going to AA, not completely sober    Patient Active Problem List   Diagnosis Date Noted  . Acute upper respiratory infection 10/01/2015  . Skin lesion of left leg 03/29/2015  . Trigger middle finger of right hand 01/11/2015  . Discoloration of eye 01/11/2015  . Bradycardia 01/11/2015  . Gastritis due to nonsteroidal anti-inflammatory drug (NSAID) 09/11/2014  . Skin lesion of scalp 07/17/2014  . Healthcare maintenance 07/17/2014  . Leg swelling 05/20/2013  . Dental caries 09/29/2012    . Vasomotor rhinitis 08/17/2012  . Upper airway cough syndrome 08/16/2012  . Right leg pain 07/26/2012  . Anxiety 02/26/2012  . Screen for STD (sexually transmitted disease) 10/13/2011  . GERD (gastroesophageal reflux disease) 08/08/2011  . Menopausal symptoms 07/23/2011  . Mastalgia 07/23/2011  . Bipolar disorder, current episode manic without psychotic features, moderate ([place]) 12/05/2010  . Exposure to HIV 12/05/2010  . 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 SEBACEOUS CYST, SCALP 05/06/2010  . SYNOVIAL CYST 05/03/2010  . Pain due to dental caries 12/06/2009  . Abdominal pain 12/06/2009  . DRUG ABUSE, HX OF 12/06/2009  . Lipoma of shoulder 02/06/2009  . FIBROIDS, UTERUS 02/06/2009  . DRUG ABUSE 02/06/2009  . ESSENTIAL HYPERTENSION, BENIGN 02/06/2009  . PAIN IN JOINT, SHOULDER REGION 02/06/2009  . Insomnia 02/06/2009  . PELVIC MASS 02/06/2009    Past Surgical History:  Procedure Laterality Date  . ABDOMINAL HYSTERECTOMY     2003  . BILATERAL SALPINGOOPHORECTOMY     01/2010  . DILATION AND CURETTAGE OF UTERUS    . EXPLORATORY LAPAROTOMY     complex pelvic mass 2011  . TONSILLECTOMY      OB History    Gravida Para Term Preterm AB Living   5 0 0 0 4 0   SAB TAB Ectopic Multiple Live Births   1 0 0 0  Home Medications    Prior to Admission medications   Medication Sig Start Date End Date Taking? Authorizing Provider  amoxicillin (AMOXIL) 500 MG capsule Take 1 capsule (500 mg total) by mouth 3 (three) times daily. 04/11/16   Tanna Furry, MD  atenolol (TENORMIN) 50 MG tablet Take 1 tablet (50 mg total) by mouth daily. 11/05/15   Sid Falcon, MD  cetirizine (ZYRTEC) 10 MG tablet Take 1 tablet (10 mg total) by mouth daily. 02/11/16   Clayton Bibles, PA-C  fluticasone (FLONASE) 50 MCG/ACT nasal spray Place 2 sprays into both nostrils daily as needed for allergies. 02/11/16   Clayton Bibles, PA-C  HYDROcodone-acetaminophen (NORCO/VICODIN) 5-325 MG tablet Take 1-2 tablets by mouth  every 6 hours as needed for pain and/or cough. 02/13/16   Jule Ser, DO  ibuprofen (ADVIL,MOTRIN) 800 MG tablet Take 1 tablet (800 mg total) by mouth 3 (three) times daily. Patient not taking: Reported on 01/01/2016 08/15/15   Jola Schmidt, MD  lisinopril-hydrochlorothiazide (PRINZIDE,ZESTORETIC) 20-12.5 MG tablet Take 1 tablet by mouth daily. 11/05/15   Sid Falcon, MD  magic mouthwash w/lidocaine SOLN Take 10 mLs by mouth 4 (four) times daily as needed for mouth pain. 02/11/16   Clayton Bibles, PA-C  pantoprazole (PROTONIX) 40 MG tablet Take 1 tablet (40 mg total) by mouth daily. 02/11/16 02/10/17  Clayton Bibles, PA-C  PARoxetine (PAXIL) 20 MG tablet Take 0.5 tablets (10 mg total) by mouth daily. 02/13/16 02/12/17  Jule Ser, DO    Family History Family History  Problem Relation Age of Onset  . Hypertension Mother   . Hypertension Sister   . Diabetes Father     Social History Social History  Substance Use Topics  . Smoking status: Current Every Day Smoker    Packs/day: 0.50    Years: 16.00    Types: Cigarettes  . Smokeless tobacco: Never Used     Comment: Wants to quit.   . Alcohol use No     Allergies   Ibuprofen   Review of Systems Review of Systems  Constitutional: Negative for appetite change, chills, diaphoresis, fatigue and fever.  HENT: Positive for dental problem. Negative for mouth sores, sore throat and trouble swallowing.   Eyes: Negative for visual disturbance.  Respiratory: Negative for cough, chest tightness, shortness of breath and wheezing.   Cardiovascular: Negative for chest pain.  Gastrointestinal: Negative for abdominal distention, abdominal pain, diarrhea, nausea and vomiting.  Endocrine: Negative for polydipsia, polyphagia and polyuria.  Genitourinary: Negative for dysuria, frequency and hematuria.  Musculoskeletal: Negative for gait problem.  Skin: Negative for color change, pallor and rash.  Neurological: Negative for dizziness, syncope, light-headedness  and headaches.  Hematological: Does not bruise/bleed easily.  Psychiatric/Behavioral: Negative for behavioral problems and confusion.     Physical Exam Updated Vital Signs BP 145/94 (BP Location: Right Arm)   Pulse (!) 55   Temp 98.1 F (36.7 C) (Oral)   Resp 18   LMP 12/04/2009   SpO2 96%   Physical Exam  Constitutional: She is oriented to person, place, and time. She appears well-developed and well-nourished. No distress.  HENT:  Head: Normocephalic.  Left mandibular second molar, tooth 18 has a white filling in place. There is some erythema of the gingiva but no fluctuance or apparent abscess. She has minimal soft tissue swelling to the ramus of the left face overlying the mandible. No induration in the neck. No brawniness. No lymphadenopathy.  Eyes: Conjunctivae are normal. Pupils are equal, round, and reactive to  light. No scleral icterus.  Neck: Normal range of motion. Neck supple. No thyromegaly present.  Cardiovascular: Normal rate and regular rhythm.  Exam reveals no gallop and no friction rub.   No murmur heard. Pulmonary/Chest: Effort normal and breath sounds normal. No respiratory distress. She has no wheezes. She has no rales.  Abdominal: Soft. Bowel sounds are normal. She exhibits no distension. There is no tenderness. There is no rebound.  Musculoskeletal: Normal range of motion.  Neurological: She is alert and oriented to person, place, and time.  Skin: Skin is warm and dry. No rash noted.  Psychiatric: She has a normal mood and affect. Her behavior is normal.     ED Treatments / Results  Labs (all labs ordered are listed, but only abnormal results are displayed) Labs Reviewed - No data to display  EKG  EKG Interpretation None       Radiology No results found.  Procedures Procedures (including critical care time)  Medications Ordered in ED Medications  gi cocktail (Maalox,Lidocaine,Donnatal) (not administered)  amoxicillin (AMOXIL) capsule 500 mg  (not administered)     Initial Impression / Assessment and Plan / ED Course  I have reviewed the triage vital signs and the nursing notes.  Pertinent labs & imaging results that were available during my care of the patient were reviewed by me and considered in my medical decision making (see chart for details).  Clinical Course   I discussed with the patient that she needs to see a dentist or oral surgeon. She immediately becomes profane. Stating that "last time they gave me to [place]. I have discussed with her at length that it is our policy not to treat chronic or dental pain with controlled substances. Of offer her antibiotics dental referral. She would like a refill on her medications including her Paxil. 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 Her Paxil was refilled in July less than 1 month ago. I declined his request. She is insistent upon getting "a strong prescription". I have declined. She becomes profane and insisting upon pain medications or narcotics. She wants to speak to "the nurse in charge or your supervisor". I am in fact the senior physician on today. She's had a medical screening exam. She does not have a life or limb threatening emergency. She will be discharged. I did write for an Amoxil prescription. Nursing had arranged for care manager speak with this patient. They will arrange for her to have the antibiotic. Was given dental referral information   Final Clinical Impressions(s) / ED Diagnoses   Final diagnoses:  Pain, dental    New Prescriptions New Prescriptions   AMOXICILLIN (AMOXIL) 500 MG CAPSULE    Take 1 capsule (500 mg total) by mouth 3 (three) times daily.     Tanna Furry, MD 04/11/16 [phone]

## 2016-04-11 NOTE — ED Triage Notes (Addendum)
Per EMS - patient had a filling in mouth (upper/lower left?) 1 month ago and has had swelling and pain since.  She has been seen in ER several times for pain and swelling, but refused to tell EMS what was done to treat her.  According to chart review, patient has not filled her Amoxicillin rx for dental infection.  Patient's vitals WNL, 1124/95, HR 58, RR 16, 96% on RA.

## 2016-04-11 NOTE — ED Notes (Signed)
Bed: WA01 Expected date:  Expected time:  Means of arrival:  Comments: 54 yo dental pain w/ swelling

## 2016-04-11 NOTE — ED Notes (Signed)
Patient became angry because EDP wouldn't give her Dilaudid or Percocet in ED, stating she had that last time.  Explained to patient that each provider is different and each approach is different.  She demanded Medical Director's name and number.  She received Patient Experience card.  Case Management was also contacted and Vista Lawman, CM, is going to call patient's pharmacy and let them know CM will cover cost of abx.  Patient's pharmacy was confirmed to be [facility] on [address] and updated in [place].  Patient was given a bus pass at discharge.

## 2016-04-13 ENCOUNTER — Telehealth: Payer: Self-pay | Admitting: *Deleted

## 2016-04-13 ENCOUNTER — Encounter: Payer: Self-pay | Admitting: *Deleted

## 2016-04-13 NOTE — Telephone Encounter (Signed)
After a long discussion about how she felt mistreated, CM determined that this pt is eligible for MATCH. Faxed to [facility] @ [employer]. Was able to override CoPay, after several attempts. (even though Rx is on the $4.00 list). Made pt aware she could go to the pharmacy to pick up the Rx and there would be no Co Pay. Explained [place]. No further CM needs at this time.

## 2016-04-17 ENCOUNTER — Encounter: Payer: Self-pay | Admitting: Internal Medicine

## 2016-04-21 ENCOUNTER — Encounter (HOSPITAL_COMMUNITY): Payer: Self-pay

## 2016-04-21 ENCOUNTER — Emergency Department (HOSPITAL_COMMUNITY)
Admission: EM | Admit: 2016-04-21 | Discharge: 2016-04-21 | Disposition: A | Discharge status: Home / Self Care | Payer: Self-pay | Attending: Emergency Medicine | Admitting: Emergency Medicine

## 2016-04-21 DIAGNOSIS — I10 Essential (primary) hypertension: Secondary | ICD-10-CM | POA: Insufficient documentation

## 2016-04-21 DIAGNOSIS — F17210 Nicotine dependence, cigarettes, uncomplicated: Secondary | ICD-10-CM | POA: Insufficient documentation

## 2016-04-21 DIAGNOSIS — Z760 Encounter for issue of repeat prescription: Secondary | ICD-10-CM | POA: Insufficient documentation

## 2016-04-21 MED ORDER — PAROXETINE HCL 20 MG PO TABS: 10.0000 mg | ORAL_TABLET | Freq: Every day | ORAL | 0 refills | Status: DC

## 2016-04-21 MED ORDER — ATENOLOL 50 MG PO TABS: 50.0000 mg | ORAL_TABLET | Freq: Every day | ORAL | 0 refills | Status: DC

## 2016-04-21 MED ORDER — LISINOPRIL-HYDROCHLOROTHIAZIDE 20-12.5 MG PO TABS: 1.0000 | ORAL_TABLET | Freq: Every day | ORAL | 0 refills | Status: DC

## 2016-04-21 NOTE — Discharge Instructions (Signed)
It is very important that you follow up with your primary physician at the next available appointment.  Return to ER for new or worsening symptoms, any additional concerns.

## 2016-04-21 NOTE — ED Provider Notes (Signed)
[place] DEPT Provider Note   CSN: [id number] Arrival date & time: 04/21/16  1016     History   Chief Complaint Chief Complaint  Patient presents with  . Medication Refill    HPI Jennifer [name] is a 55 y.o. female.  The history is provided by the patient and medical records. No language interpreter was used.  Medication Refill   Jennifer [name] is a 55 y.o. female  with a PMH of bipolar disorder, HTN, polysubstance abuse, depression who presents to the Emergency Department for refills on her medications. Patient states that she has been out of her Paxil 2 weeks and her blood pressure medicine 2 days. Unfortunately she has had difficulty with her [place] card and cannot follow up with her PCP yet. She states that she knows where to go and what forms to fill out to get the situation resolved. She has no complaints at this time. Denies chest pain, shortness breath, abdominal pain, headache, visual changes or any additional complaints.   Past Medical History:  Diagnosis Date  . bipolar   . Depression   . Fibroids    now s/p hysterectomy  . Hypertension   . Left ventricular hypertrophy   . Polysubstance abuse    IV drug [place], cocaine on occassion, smoking and alcoholism, has been going to AA, not completely sober    Patient Active Problem List   Diagnosis Date Noted  . Acute upper respiratory infection 10/01/2015  . Skin lesion of left leg 03/29/2015  . Trigger middle finger of right hand 01/11/2015  . Discoloration of eye 01/11/2015  . Bradycardia 01/11/2015  . Gastritis due to nonsteroidal anti-inflammatory drug (NSAID) 09/11/2014  . Skin lesion of scalp 07/17/2014  . Healthcare maintenance 07/17/2014  . Leg swelling 05/20/2013  . Dental caries 09/29/2012  . Vasomotor rhinitis 08/17/2012  . Upper airway cough syndrome 08/16/2012  . Right leg pain 07/26/2012  . Anxiety 02/26/2012  . Screen for STD (sexually transmitted disease) 10/13/2011  . GERD  (gastroesophageal reflux disease) 08/08/2011  . Menopausal symptoms 07/23/2011  . Mastalgia 07/23/2011  . Bipolar disorder, current episode manic without psychotic features, moderate ([place]) 12/05/2010  . Exposure to HIV 12/05/2010  . 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 SEBACEOUS CYST, SCALP 05/06/2010  . SYNOVIAL CYST 05/03/2010  . Pain due to dental caries 12/06/2009  . Abdominal pain 12/06/2009  . DRUG ABUSE, HX OF 12/06/2009  . Lipoma of shoulder 02/06/2009  . FIBROIDS, UTERUS 02/06/2009  . DRUG ABUSE 02/06/2009  . ESSENTIAL HYPERTENSION, BENIGN 02/06/2009  . PAIN IN JOINT, SHOULDER REGION 02/06/2009  . Insomnia 02/06/2009  . PELVIC MASS 02/06/2009    Past Surgical History:  Procedure Laterality Date  . ABDOMINAL HYSTERECTOMY     2003  . BILATERAL SALPINGOOPHORECTOMY     01/2010  . DILATION AND CURETTAGE OF UTERUS    . EXPLORATORY LAPAROTOMY     complex pelvic mass 2011  . TONSILLECTOMY      OB History    Gravida Para Term Preterm AB Living   5 0 0 0 4 0   SAB TAB Ectopic Multiple Live Births   1 0 0 0         Home Medications    Prior to Admission medications   Medication Sig Start Date End Date Taking? Authorizing Provider  amoxicillin (AMOXIL) 500 MG capsule Take 1 capsule (500 mg total) by mouth 3 (three) times daily. 04/11/16   Tanna Furry, MD  atenolol (TENORMIN) 50 MG tablet  Take 1 tablet (50 mg total) by mouth daily. 04/21/16   Ozella Almond Ward, PA-C  cetirizine (ZYRTEC) 10 MG tablet Take 1 tablet (10 mg total) by mouth daily. 02/11/16   Clayton Bibles, PA-C  fluticasone (FLONASE) 50 MCG/ACT nasal spray Place 2 sprays into both nostrils daily as needed for allergies. 02/11/16   Clayton Bibles, PA-C  HYDROcodone-acetaminophen (NORCO/VICODIN) 5-325 MG tablet Take 1-2 tablets by mouth every 6 hours as needed for pain and/or cough. 02/13/16   Jule Ser, DO  ibuprofen (ADVIL,MOTRIN) 800 MG tablet Take 1 tablet (800 mg total) by mouth 3 (three) times daily. Patient not taking:  Reported on 01/01/2016 08/15/15   Jola Schmidt, MD  lisinopril-hydrochlorothiazide (ZESTORETIC) 20-12.5 MG tablet Take 1 tablet by mouth daily. 04/21/16   Ozella Almond Ward, PA-C  magic mouthwash w/lidocaine SOLN Take 10 mLs by mouth 4 (four) times daily as needed for mouth pain. 02/11/16   Clayton Bibles, PA-C  pantoprazole (PROTONIX) 40 MG tablet Take 1 tablet (40 mg total) by mouth daily. 02/11/16 02/10/17  Clayton Bibles, PA-C  PARoxetine (PAXIL) 20 MG tablet Take 0.5 tablets (10 mg total) by mouth daily. 04/21/16   [place], PA-C    Family History Family History  Problem Relation Age of Onset  . Hypertension Mother   . Hypertension Sister   . Diabetes Father     Social History Social History  Substance Use Topics  . Smoking status: Current Every Day Smoker    Packs/day: 0.50    Years: 16.00    Types: Cigarettes  . Smokeless tobacco: Never Used     Comment: Wants to quit.   . Alcohol use No     Allergies   Ibuprofen   Review of Systems Review of Systems  Eyes: Negative for visual disturbance.  Respiratory: Negative for shortness of breath.   Cardiovascular: Negative for chest pain.  Gastrointestinal: Negative for abdominal pain.  Neurological: Negative for headaches.     Physical Exam Updated Vital Signs BP 128/89 (BP Location: Right Arm)   Pulse (!) 58   Temp 98.2 F (36.8 C) (Oral)   Resp 18   Ht 5\' 4"  (1.626 m)   Wt 92 kg   LMP 12/04/2009   SpO2 98%   BMI 34.81 kg/m   Physical Exam  Constitutional: She is oriented to person, place, and time. She appears well-developed and well-nourished. No distress.  HENT:  Head: Normocephalic and atraumatic.  Cardiovascular: Normal rate, regular rhythm, normal heart sounds and intact distal pulses.  Exam reveals no gallop and no friction rub.   No murmur heard. Pulmonary/Chest: Effort normal and breath sounds normal. No respiratory distress. She has no wheezes. She has no rales. She exhibits no tenderness.  Abdominal:  Soft. She exhibits no distension. There is no tenderness.  Musculoskeletal: She exhibits no edema.  Neurological: She is alert and oriented to person, place, and time.  Skin: Skin is warm and dry.  Nursing note and vitals reviewed.    ED Treatments / Results  Labs (all labs ordered are listed, but only abnormal results are displayed) Labs Reviewed - No data to display  EKG  EKG Interpretation None       Radiology No results found.  Procedures Procedures (including critical care time)  Medications Ordered in ED Medications - No data to display   Initial Impression / Assessment and Plan / ED Course  I have reviewed the triage vital signs and the nursing notes.  Pertinent labs & imaging  results that were available during my care of the patient were reviewed by me and considered in my medical decision making (see chart for details).  Clinical Course   Jennifer [name] is a 55 y.o. female who presents to ED requesting refill of HTN meds and Paxil. She recently lost her orange card status and has not been able to see her primary care physician because of this. Chart was reviewed and I can see notes from internal medicine. The medications and dosages were verified. Discussed the importance of following up with her primary care provider as soon as possible. She knows what steps she needs to take to get orange card reestablished. One-month refill of medications provided and all questions answered.  Final Clinical Impressions(s) / ED Diagnoses   Final diagnoses:  Medication refill    New Prescriptions New Prescriptions   ATENOLOL (TENORMIN) 50 MG TABLET    Take 1 tablet (50 mg total) by mouth daily.   LISINOPRIL-HYDROCHLOROTHIAZIDE (ZESTORETIC) 20-12.5 MG TABLET    Take 1 tablet by mouth daily.   PAROXETINE (PAXIL) 20 MG TABLET    Take 0.5 tablets (10 mg total) by mouth daily.     [facility] Ward, PA-C 04/21/16 [place], MD 04/22/16 [phone]

## 2016-04-21 NOTE — ED Triage Notes (Signed)
Patient here requesting refills of HTN medication and her paxil. Out x 2 weeks, no complaints

## 2016-04-28 ENCOUNTER — Telehealth: Payer: Self-pay | Admitting: Internal Medicine

## 2016-04-28 NOTE — Telephone Encounter (Signed)
RECEIVED CALL FROM LISA DUCK WITH [facility]. [place] LAST WEEK WHERE HER DENTAL NEEDS WERE TAKEN CARE OF. SHE HAS CALLED THE DENTAL CLINIC AND LISA SEVERAL TIMES SINCE HER APPOINTMENT SEEKING PAIN MEDS. LISA SAID SHE WAS VERY UGLY TO HER AND THE DENTAL STAFF, TELLING THEM SHE WAS GETTING AN ATTORNEY AND SUE THEM.

## 2016-04-30 ENCOUNTER — Ambulatory Visit: Payer: Self-pay

## 2016-05-26 ENCOUNTER — Emergency Department (HOSPITAL_COMMUNITY)
Admission: EM | Admit: 2016-05-26 | Discharge: 2016-05-26 | Disposition: A | Discharge status: Home / Self Care | Payer: Self-pay | Attending: Dermatology | Admitting: Dermatology

## 2016-05-26 ENCOUNTER — Encounter (HOSPITAL_COMMUNITY): Payer: Self-pay | Admitting: Emergency Medicine

## 2016-05-26 DIAGNOSIS — Z5321 Procedure and treatment not carried out due to patient leaving prior to being seen by health care provider: Secondary | ICD-10-CM | POA: Insufficient documentation

## 2016-05-26 DIAGNOSIS — F419 Anxiety disorder, unspecified: Secondary | ICD-10-CM | POA: Insufficient documentation

## 2016-05-26 DIAGNOSIS — I10 Essential (primary) hypertension: Secondary | ICD-10-CM | POA: Insufficient documentation

## 2016-05-26 DIAGNOSIS — F17210 Nicotine dependence, cigarettes, uncomplicated: Secondary | ICD-10-CM | POA: Insufficient documentation

## 2016-05-26 MED FILL — LISINOPRIL-HCTZ 20-12.5 MG: 20-12.5 | 30 days supply | Qty: 30 | Fill #0

## 2016-05-26 MED FILL — ATENOLOL 50 MG TABLET: 50 | 30 days supply | Qty: 30 | Fill #0

## 2016-05-26 NOTE — ED Notes (Signed)
Called patient no answer in waiting room.

## 2016-05-26 NOTE — Discharge Planning (Signed)
[facility] consulted to assist pt with medication refill.  [facility] met wit pt in ED waiting room; pt explained that her orange card has expired and she is in need of her BP meds.  EDCM searched chart to find that pt was enrolled in [facility] program 04/2016; asked pt about enrollment pt states she never received anything and she had to beg health department for her meds.  EDCM updated [place] card so pt may pay $3 for each med.  Pt states that co-pay is affordable.  No further CM needs noted at this time.

## 2016-05-26 NOTE — ED Triage Notes (Signed)
Pt here for refills on htn and anxiety meds

## 2016-05-26 NOTE — ED Notes (Signed)
Called Pt. No response. Tech at [facility] said she was called 3 times before with no response.

## 2016-05-30 ENCOUNTER — Emergency Department (HOSPITAL_COMMUNITY)
Admission: EM | Admit: 2016-05-30 | Discharge: 2016-05-30 | Disposition: A | Discharge status: Home / Self Care | Payer: Self-pay | Attending: Emergency Medicine | Admitting: Emergency Medicine

## 2016-05-30 ENCOUNTER — Encounter (HOSPITAL_COMMUNITY): Payer: Self-pay

## 2016-05-30 DIAGNOSIS — Z79899 Other long term (current) drug therapy: Secondary | ICD-10-CM | POA: Insufficient documentation

## 2016-05-30 DIAGNOSIS — F17210 Nicotine dependence, cigarettes, uncomplicated: Secondary | ICD-10-CM | POA: Insufficient documentation

## 2016-05-30 DIAGNOSIS — K0889 Other specified disorders of teeth and supporting structures: Secondary | ICD-10-CM | POA: Insufficient documentation

## 2016-05-30 DIAGNOSIS — I10 Essential (primary) hypertension: Secondary | ICD-10-CM | POA: Insufficient documentation

## 2016-05-30 MED ORDER — TRAMADOL HCL 50 MG PO TABS
50.0000 mg | ORAL_TABLET | Freq: Once | ORAL | Status: AC
  Administered 2016-05-30: 50 mg via ORAL
  Filled 2016-05-30: qty 1

## 2016-05-30 MED ORDER — PENICILLIN V POTASSIUM 500 MG PO TABS: 500.0000 mg | ORAL_TABLET | Freq: Four times a day (QID) | ORAL | 0 refills | Status: DC

## 2016-05-30 MED ORDER — OXYCODONE-ACETAMINOPHEN 5-325 MG PO TABS: 1.0000 | ORAL_TABLET | Freq: Once | ORAL | Status: DC

## 2016-05-30 MED ORDER — HYDROCODONE-ACETAMINOPHEN 5-325 MG PO TABS: 1.0000 | ORAL_TABLET | Freq: Four times a day (QID) | ORAL | 0 refills | Status: DC | PRN

## 2016-05-30 MED ORDER — PENICILLIN V POTASSIUM 250 MG PO TABS
500.0000 mg | ORAL_TABLET | Freq: Once | ORAL | Status: AC
  Administered 2016-05-30: 500 mg via ORAL
  Filled 2016-05-30: qty 2

## 2016-05-30 NOTE — ED Triage Notes (Signed)
Per pt, Pt is coming from home with complaints of rash in mouth. Pt reports noting pus and discomfort inside to the left side. Pt is able to talk. Pt is requesting antibiotic with Hx of the same.

## 2016-05-30 NOTE — ED Provider Notes (Signed)
[facility] DEPT Provider Note   By signing my name below, I, Bea Graff, attest that this documentation has been prepared under the direction and in the presence of [employer], PA-C. Electronically Signed: Bea Graff, ED Scribe. 05/30/16. 10:47 AM.    History   Chief Complaint Chief Complaint  Patient presents with  . Oral Swelling    GUM SWELLING AND INFECTION    The history is provided by the patient and medical records. No language interpreter was used.    HPI Comments:  Jennifer [name] is a 55 y.o. female with PMHx of bipolar disorder, HTN and polysubstance abuse who presents to the Emergency Department complaining of swelling to the left lower gingiva that began about three days ago. Pt reports associated purulent drainage from "bumps" on the gums. She reports having similar symptoms in the past and has recently been on antibiotics. She has not taken anything for pain. She denies modifying factors. She denies fever, chills, nausea, vomiting, facial swelling or difficulty swallowing.   Past Medical History:  Diagnosis Date  . bipolar   . Depression   . Fibroids    now s/p hysterectomy  . Hypertension   . Left ventricular hypertrophy   . Polysubstance abuse    IV drug [place], cocaine on occassion, smoking and alcoholism, has been going to AA, not completely sober    Patient Active Problem List   Diagnosis Date Noted  . Acute upper respiratory infection 10/01/2015  . Skin lesion of left leg 03/29/2015  . Trigger middle finger of right hand 01/11/2015  . Discoloration of eye 01/11/2015  . Bradycardia 01/11/2015  . Gastritis due to nonsteroidal anti-inflammatory drug (NSAID) 09/11/2014  . Skin lesion of scalp 07/17/2014  . Healthcare maintenance 07/17/2014  . Leg swelling 05/20/2013  . Dental caries 09/29/2012  . Vasomotor rhinitis 08/17/2012  . Upper airway cough syndrome 08/16/2012  . Right leg pain 07/26/2012  . Anxiety 02/26/2012  . Screen for  STD (sexually transmitted disease) 10/13/2011  . GERD (gastroesophageal reflux disease) 08/08/2011  . Menopausal symptoms 07/23/2011  . Mastalgia 07/23/2011  . Bipolar disorder, current episode manic without psychotic features, moderate ([place]) 12/05/2010  . Exposure to HIV 12/05/2010  . 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 SEBACEOUS CYST, SCALP 05/06/2010  . SYNOVIAL CYST 05/03/2010  . Pain due to dental caries 12/06/2009  . Abdominal pain 12/06/2009  . DRUG ABUSE, HX OF 12/06/2009  . Lipoma of shoulder 02/06/2009  . FIBROIDS, UTERUS 02/06/2009  . DRUG ABUSE 02/06/2009  . ESSENTIAL HYPERTENSION, BENIGN 02/06/2009  . PAIN IN JOINT, SHOULDER REGION 02/06/2009  . Insomnia 02/06/2009  . PELVIC MASS 02/06/2009    Past Surgical History:  Procedure Laterality Date  . ABDOMINAL HYSTERECTOMY     2003  . BILATERAL SALPINGOOPHORECTOMY     01/2010  . DILATION AND CURETTAGE OF UTERUS    . EXPLORATORY LAPAROTOMY     complex pelvic mass 2011  . TONSILLECTOMY      OB History    Gravida Para Term Preterm AB Living   5 0 0 0 4 0   SAB TAB Ectopic Multiple Live Births   1 0 0 0         Home Medications    Prior to Admission medications   Medication Sig Start Date End Date Taking? Authorizing Provider  amoxicillin (AMOXIL) 500 MG capsule Take 1 capsule (500 mg total) by mouth 3 (three) times daily. 04/11/16   Tanna Furry, MD  atenolol (TENORMIN) 50 MG tablet  Take 1 tablet (50 mg total) by mouth daily. 04/21/16   Ozella Almond Ward, PA-C  cetirizine (ZYRTEC) 10 MG tablet Take 1 tablet (10 mg total) by mouth daily. 02/11/16   Clayton Bibles, PA-C  fluticasone (FLONASE) 50 MCG/ACT nasal spray Place 2 sprays into both nostrils daily as needed for allergies. 02/11/16   Clayton Bibles, PA-C  HYDROcodone-acetaminophen (NORCO/VICODIN) 5-325 MG tablet Take 1-2 tablets by mouth every 6 hours as needed for pain and/or cough. 02/13/16   Jule Ser, DO  ibuprofen (ADVIL,MOTRIN) 800 MG tablet Take 1 tablet (800 mg  total) by mouth 3 (three) times daily. Patient not taking: Reported on 01/01/2016 08/15/15   Jola Schmidt, MD  lisinopril-hydrochlorothiazide (ZESTORETIC) 20-12.5 MG tablet Take 1 tablet by mouth daily. 04/21/16   Ozella Almond Ward, PA-C  magic mouthwash w/lidocaine SOLN Take 10 mLs by mouth 4 (four) times daily as needed for mouth pain. 02/11/16   Clayton Bibles, PA-C  pantoprazole (PROTONIX) 40 MG tablet Take 1 tablet (40 mg total) by mouth daily. 02/11/16 02/10/17  Clayton Bibles, PA-C  PARoxetine (PAXIL) 20 MG tablet Take 0.5 tablets (10 mg total) by mouth daily. 04/21/16   Victoria Vera, PA-C    Family History Family History  Problem Relation Age of Onset  . Hypertension Mother   . Hypertension Sister   . Diabetes Father     Social History Social History  Substance Use Topics  . Smoking status: Current Every Day Smoker    Packs/day: 0.50    Years: 16.00    Types: Cigarettes  . Smokeless tobacco: Never Used     Comment: Wants to quit.   . Alcohol use No     Allergies   Ibuprofen   Review of Systems Review of Systems  Constitutional: Negative for chills and fever.  HENT: Positive for dental problem. Negative for facial swelling and trouble swallowing.   Gastrointestinal: Negative for nausea and vomiting.     Physical Exam Updated Vital Signs BP 141/100 (BP Location: Left Arm)   Pulse (!) 59   Temp 98.4 F (36.9 C) (Oral)   Resp 16   LMP 12/04/2009   SpO2 100%   Physical Exam  Constitutional: She is oriented to person, place, and time. She appears well-developed and well-nourished.  HENT:  Head: Normocephalic and atraumatic.  Mouth/Throat: Uvula is midline, oropharynx is clear and moist and mucous membranes are normal. No trismus in the jaw. Abnormal dentition. Dental caries present. No dental abscesses.  Irritation, swelling and erythema to lower gumline throughout. No obvious dental abscess appreciated.  Neck: Normal range of motion.  Cardiovascular: Normal rate.     Pulmonary/Chest: Effort normal.  Musculoskeletal: Normal range of motion.  Neurological: She is alert and oriented to person, place, and time. Coordination abnormal.  Skin: Skin is warm and dry.  Psychiatric: She has a normal mood and affect. Her behavior is normal.  Nursing note and vitals reviewed.    ED Treatments / Results  DIAGNOSTIC STUDIES: Oxygen Saturation is 100% on RA, normal by my interpretation.   COORDINATION OF CARE: 10:33 AM- Will prescribe antibiotic and give referral to dentist. Pt verbalizes understanding and agrees to plan.  Medications  traMADol (ULTRAM) tablet 50 mg (50 mg Oral Given 05/30/16 1008)     Labs (all labs ordered are listed, but only abnormal results are displayed) Labs Reviewed - No data to display  EKG  EKG Interpretation None       Radiology No results found.  Procedures Procedures (  including critical care time)  Medications Ordered in ED Medications - No data to display   Initial Impression / Assessment and Plan / ED Course  I have reviewed the triage vital signs and the nursing notes.  Pertinent labs & imaging results that were available during my care of the patient were reviewed by me and considered in my medical decision making (see chart for details).  Clinical Course    Patient be referred to dentistry.  Told to return here as needed.  Patient agrees the plan and all questions were answered  Final Clinical Impressions(s) / ED Diagnoses   Final diagnoses:  None    New Prescriptions New Prescriptions   No medications on file       Dalia Heading, PA-C 06/03/16 [place], MD 06/04/16 [phone]

## 2016-05-30 NOTE — ED Notes (Signed)
PA in to see pt. 

## 2016-05-30 NOTE — Discharge Instructions (Signed)
Return here as needed.  Rinse with warm water and peroxide 3 times a day.  Follow-up with the dentist provided

## 2016-06-04 ENCOUNTER — Other Ambulatory Visit: Payer: Self-pay | Admitting: Obstetrics and Gynecology

## 2016-06-04 DIAGNOSIS — Z1231 Encounter for screening mammogram for malignant neoplasm of breast: Secondary | ICD-10-CM

## 2016-06-12 ENCOUNTER — Emergency Department (HOSPITAL_COMMUNITY): Payer: Self-pay

## 2016-06-12 ENCOUNTER — Encounter (HOSPITAL_COMMUNITY): Payer: Self-pay | Admitting: Emergency Medicine

## 2016-06-12 ENCOUNTER — Emergency Department (HOSPITAL_COMMUNITY)
Admission: EM | Admit: 2016-06-12 | Discharge: 2016-06-12 | Disposition: A | Discharge status: Home / Self Care | Payer: Self-pay | Attending: Emergency Medicine | Admitting: Emergency Medicine

## 2016-06-12 DIAGNOSIS — F17210 Nicotine dependence, cigarettes, uncomplicated: Secondary | ICD-10-CM | POA: Insufficient documentation

## 2016-06-12 DIAGNOSIS — Z79899 Other long term (current) drug therapy: Secondary | ICD-10-CM | POA: Insufficient documentation

## 2016-06-12 DIAGNOSIS — I10 Essential (primary) hypertension: Secondary | ICD-10-CM | POA: Insufficient documentation

## 2016-06-12 DIAGNOSIS — R1011 Right upper quadrant pain: Secondary | ICD-10-CM | POA: Insufficient documentation

## 2016-06-12 LAB — URINALYSIS, ROUTINE W REFLEX MICROSCOPIC
Bilirubin Urine: NEGATIVE
Glucose, UA: NEGATIVE mg/dL
Hgb urine dipstick: NEGATIVE
Ketones, ur: NEGATIVE mg/dL
NITRITE: NEGATIVE
PH: 6 (ref 5.0–8.0)
Protein, ur: NEGATIVE mg/dL
SPECIFIC GRAVITY, URINE: 1.023 (ref 1.005–1.030)

## 2016-06-12 LAB — CBC
HEMATOCRIT: 41.6 % (ref 36.0–46.0)
HEMOGLOBIN: 13.8 g/dL (ref 12.0–15.0)
MCH: 29 pg (ref 26.0–34.0)
MCHC: 33.2 g/dL (ref 30.0–36.0)
MCV: 87.4 fL (ref 78.0–100.0)
Platelets: 173 10*3/uL (ref 150–400)
RBC: 4.76 MIL/uL (ref 3.87–5.11)
RDW: 15.2 % (ref 11.5–15.5)
WBC: 5.7 10*3/uL (ref 4.0–10.5)

## 2016-06-12 LAB — COMPREHENSIVE METABOLIC PANEL
ALBUMIN: 4.2 g/dL (ref 3.5–5.0)
ALK PHOS: 50 U/L (ref 38–126)
ALT: 19 U/L (ref 14–54)
ANION GAP: 6 (ref 5–15)
AST: 17 U/L (ref 15–41)
BILIRUBIN TOTAL: 0.5 mg/dL (ref 0.3–1.2)
BUN: 17 mg/dL (ref 6–20)
CO2: 25 mmol/L (ref 22–32)
Calcium: 10 mg/dL (ref 8.9–10.3)
Chloride: 107 mmol/L (ref 101–111)
Creatinine, Ser: 0.84 mg/dL (ref 0.44–1.00)
GFR calc Af Amer: >60 mL/min (ref >60)
GFR calc non Af Amer: >60 mL/min (ref >60)
GLUCOSE: 93 mg/dL (ref 65–99)
Potassium: 4.1 mmol/L (ref 3.5–5.1)
Sodium: 138 mmol/L (ref 135–145)
Total Protein: 7.6 g/dL (ref 6.5–8.1)

## 2016-06-12 LAB — URINE MICROSCOPIC-ADD ON

## 2016-06-12 LAB — LIPASE, BLOOD: Lipase: 32 U/L (ref 11–51)

## 2016-06-12 MED ORDER — SODIUM CHLORIDE 0.9 % IV BOLUS (SEPSIS)
1000.0000 mL | Freq: Once | INTRAVENOUS | Status: AC
  Administered 2016-06-12: 1000 mL via INTRAVENOUS

## 2016-06-12 MED ORDER — KETOROLAC TROMETHAMINE 30 MG/ML IJ SOLN
15.0000 mg | Freq: Once | INTRAMUSCULAR | Status: AC
  Administered 2016-06-12: 15 mg via INTRAVENOUS
  Filled 2016-06-12: qty 1

## 2016-06-12 MED ORDER — PAROXETINE HCL 20 MG PO TABS: 10.0000 mg | ORAL_TABLET | Freq: Every day | ORAL | 1 refills | Status: DC

## 2016-06-12 MED ORDER — SODIUM CHLORIDE 0.9 % IV SOLN: INTRAVENOUS | Status: DC

## 2016-06-12 NOTE — ED Provider Notes (Signed)
[facility] DEPT Provider Note   CSN: [id number] Arrival date & time: 06/12/16  1013     History   Chief Complaint Chief Complaint  Patient presents with  . Abdominal Pain    HPI Jennifer [name] is a 55 y.o. female.  Pt feels like she has a pain in her right upper abdomen.  It feels like a scrub brush the size of her fist is in there.  This am she had bacon and eggs around 0430.  She vomited afterwards.  Even drinking juice or water makes it worse.  It also feels like it takes food a while to get through her esophagus.    Pt also wants an alternative antibiotic for her tooth infection.  She was prescribed PCN recently but it cost 36 dollars at the store.  She would also like a refill of her other medications because she lost her orange card   The history is provided by the patient.  Abdominal Pain   This is a new problem. The current episode started more than 2 days ago. The problem has been gradually worsening. The pain is associated with eating. The pain is located in the RUQ. The quality of the pain is sharp. The pain is moderate. Associated symptoms include nausea and vomiting. Pertinent negatives include fever, diarrhea, dysuria and headaches. The symptoms are aggravated by eating. Nothing relieves the symptoms. Her past medical history is significant for GERD. Her past medical history does not include PUD, gallstones or ulcerative colitis.    Past Medical History:  Diagnosis Date  . bipolar   . Depression   . Fibroids    now s/p hysterectomy  . Hypertension   . Left ventricular hypertrophy   . Polysubstance abuse    IV drug [place], cocaine on occassion, smoking and alcoholism, has been going to AA, not completely sober    Patient Active Problem List   Diagnosis Date Noted  . Acute upper respiratory infection 10/01/2015  . Skin lesion of left leg 03/29/2015  . Trigger middle finger of right hand 01/11/2015  . Discoloration of eye 01/11/2015  . Bradycardia  01/11/2015  . Gastritis due to nonsteroidal anti-inflammatory drug (NSAID) 09/11/2014  . Skin lesion of scalp 07/17/2014  . Healthcare maintenance 07/17/2014  . Leg swelling 05/20/2013  . Dental caries 09/29/2012  . Vasomotor rhinitis 08/17/2012  . Upper airway cough syndrome 08/16/2012  . Right leg pain 07/26/2012  . Anxiety 02/26/2012  . Screen for STD (sexually transmitted disease) 10/13/2011  . GERD (gastroesophageal reflux disease) 08/08/2011  . Menopausal symptoms 07/23/2011  . Mastalgia 07/23/2011  . Bipolar disorder, current episode manic without psychotic features, moderate ([place]) 12/05/2010  . Exposure to HIV 12/05/2010  . 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 SEBACEOUS CYST, SCALP 05/06/2010  . SYNOVIAL CYST 05/03/2010  . Pain due to dental caries 12/06/2009  . Abdominal pain 12/06/2009  . DRUG ABUSE, HX OF 12/06/2009  . Lipoma of shoulder 02/06/2009  . FIBROIDS, UTERUS 02/06/2009  . DRUG ABUSE 02/06/2009  . ESSENTIAL HYPERTENSION, BENIGN 02/06/2009  . PAIN IN JOINT, SHOULDER REGION 02/06/2009  . Insomnia 02/06/2009  . PELVIC MASS 02/06/2009    Past Surgical History:  Procedure Laterality Date  . ABDOMINAL HYSTERECTOMY     2003  . BILATERAL SALPINGOOPHORECTOMY     01/2010  . DILATION AND CURETTAGE OF UTERUS    . EXPLORATORY LAPAROTOMY     complex pelvic mass 2011  . TONSILLECTOMY      OB History  Gravida Para Term Preterm AB Living   5 0 0 0 4 0   SAB TAB Ectopic Multiple Live Births   1 0 0 0         Home Medications    Prior to Admission medications   Medication Sig Start Date End Date Taking? Authorizing Provider  acetaminophen (TYLENOL) 500 MG tablet Take 500 mg by mouth every 6 (six) hours as needed for mild pain.   Yes Historical Provider, MD  naproxen sodium (ANAPROX) 220 MG tablet Take 220 mg by mouth daily as needed (for pain).   Yes Historical Provider, MD  amoxicillin (AMOXIL) 500 MG capsule Take 1 capsule (500 mg total) by mouth 3 (three)  times daily. 04/11/16   Tanna Furry, MD  atenolol (TENORMIN) 50 MG tablet Take 1 tablet (50 mg total) by mouth daily. 04/21/16   Ozella Almond Ward, PA-C  cetirizine (ZYRTEC) 10 MG tablet Take 1 tablet (10 mg total) by mouth daily. 02/11/16   Clayton Bibles, PA-C  fluticasone (FLONASE) 50 MCG/ACT nasal spray Place 2 sprays into both nostrils daily as needed for allergies. 02/11/16   Clayton Bibles, PA-C  HYDROcodone-acetaminophen (NORCO/VICODIN) 5-325 MG tablet Take 1 tablet by mouth every 6 (six) hours as needed for moderate pain. 05/30/16   Dalia Heading, PA-C  ibuprofen (ADVIL,MOTRIN) 800 MG tablet Take 1 tablet (800 mg total) by mouth 3 (three) times daily. Patient not taking: Reported on 01/01/2016 08/15/15   Jola Schmidt, MD  lisinopril-hydrochlorothiazide (ZESTORETIC) 20-12.5 MG tablet Take 1 tablet by mouth daily. 04/21/16   Ozella Almond Ward, PA-C  magic mouthwash w/lidocaine SOLN Take 10 mLs by mouth 4 (four) times daily as needed for mouth pain. 02/11/16   Clayton Bibles, PA-C  pantoprazole (PROTONIX) 40 MG tablet Take 1 tablet (40 mg total) by mouth daily. 02/11/16 02/10/17  Clayton Bibles, PA-C  PARoxetine (PAXIL) 20 MG tablet Take 0.5 tablets (10 mg total) by mouth daily. 06/12/16   Dorie Rank, MD  penicillin v potassium (VEETID) 500 MG tablet Take 1 tablet (500 mg total) by mouth 4 (four) times daily. 05/30/16   Dalia Heading, PA-C    Family History Family History  Problem Relation Age of Onset  . Hypertension Mother   . Hypertension Sister   . Diabetes Father     Social History Social History  Substance Use Topics  . Smoking status: Current Every Day Smoker    Packs/day: 0.50    Years: 16.00    Types: Cigarettes  . Smokeless tobacco: Never Used     Comment: Wants to quit.   . Alcohol use No     Allergies   Ibuprofen   Review of Systems Review of Systems  Constitutional: Negative for fever.  HENT:       Recent toothache   Gastrointestinal: Positive for abdominal pain, nausea and  vomiting. Negative for diarrhea.  Genitourinary: Negative for dysuria.  Neurological: Negative for headaches.  All other systems reviewed and are negative.    Physical Exam Updated Vital Signs BP 158/91 (BP Location: Left Arm)   Pulse (!) 52   Temp 98.2 F (36.8 C) (Oral)   Resp 16   Ht 5\' 4"  (1.626 m)   Wt 93 kg   LMP 12/04/2009   SpO2 96%   BMI 35.19 kg/m   Physical Exam  Constitutional: She appears well-developed and well-nourished. No distress.  HENT:  Head: Normocephalic and atraumatic.  Right Ear: External ear normal.  Left Ear: External ear normal.  Mouth/Throat:  No oropharyngeal exudate.  Eyes: Conjunctivae are normal. Right eye exhibits no discharge. Left eye exhibits no discharge. No scleral icterus.  Neck: Neck supple. No tracheal deviation present.  Cardiovascular: Normal rate, regular rhythm and intact distal pulses.   Pulmonary/Chest: Effort normal and breath sounds normal. No stridor. No respiratory distress. She has no wheezes. She has no rales.  Abdominal: Soft. Bowel sounds are normal. She exhibits no distension. There is tenderness. There is no rebound and no guarding.  Musculoskeletal: She exhibits no edema or tenderness.  Neurological: She is alert. She has normal strength. No cranial nerve deficit (no facial droop, extraocular movements intact, no slurred speech) or sensory deficit. She exhibits normal muscle tone. She displays no seizure activity. Coordination normal.  Skin: Skin is warm and dry. No rash noted.  Psychiatric: She has a normal mood and affect.  Nursing note and vitals reviewed.    ED Treatments / Results  Labs (all labs ordered are listed, but only abnormal results are displayed) Labs Reviewed  URINALYSIS, ROUTINE W REFLEX MICROSCOPIC (NOT AT [facility]) - Abnormal; Notable for the following:       Result Value   Leukocytes, UA SMALL (*)    All other components within normal limits  URINE MICROSCOPIC-ADD ON - Abnormal; Notable for the  following:    Squamous Epithelial / LPF 0-5 (*)    Bacteria, UA FEW (*)    All other components within normal limits  LIPASE, BLOOD  COMPREHENSIVE METABOLIC PANEL  CBC    EKG  EKG Interpretation None       Radiology US Abdomen Complete  Result Date: 06/12/2016 CLINICAL DATA:  Right upper quadrant pain of uncertain duration. History of hypertension. EXAM: ABDOMEN ULTRASOUND COMPLETE COMPARISON:  Abdominal ultrasound examination dated September 25, 2014 FINDINGS: Gallbladder: No gallstones or wall thickening visualized. No sonographic Murphy sign noted by sonographer. Common bile duct: Diameter: 1.3 cm Liver: No focal lesion identified. Within normal limits in parenchymal echogenicity. IVC: No abnormality visualized. Pancreas: Visualized portion unremarkable. Spleen: Size and appearance within normal limits. Right Kidney: Length: 12 cm. Echogenicity within normal limits. No mass or hydronephrosis visualized. Left Kidney: Length: 11.7 cm. Echogenicity within normal limits. No mass or hydronephrosis visualized. Abdominal aorta: No aneurysm visualized. Other findings: There is no ascites. IMPRESSION: 1. No gallstones or sonographic evidence of acute cholecystitis. If there are clinical concerns of chronic cholecystitis, a nuclear medicine hepatobiliary scan with gallbladder ejection fraction determination may be useful. 2. No intra-abdominal abnormality is observed. Electronically Signed   By: David  Martinique M.D.   On: 06/12/2016 12:00   Dg Abd Acute W/chest  Result Date: 06/12/2016 CLINICAL DATA:  Right upper quadrant discomfort for the past 2 weeks. History of left ventricular hypertrophy and current smoker. History of fibroids. EXAM: DG ABDOMEN ACUTE W/ 1V CHEST COMPARISON:  PA and lateral chest x-ray of September 05, 2015 FINDINGS: The lungs are well-expanded. There is stable linear density lateral to the left heart border consistent with scarring. The heart is top-normal in size. The pulmonary  vascularity is normal. The mediastinum is normal in width. There is mild tortuosity of the descending thoracic aorta with mural calcification. Within the abdomen the bowel gas pattern is normal. No abnormal soft tissue calcifications are observed. There are phleboliths within the pelvis. There are mild degenerative disc changes in the lower lumbar spine. IMPRESSION: 1. There is no active cardiopulmonary disease. 2. Aortic atherosclerosis. 3. No acute intra-abdominal abnormality is observed. Electronically Signed   By: Shanon Brow  [place] M.D.   On: 06/12/2016 11:28    Procedures Procedures (including critical care time)  Medications Ordered in ED Medications  sodium chloride 0.9 % bolus 1,000 mL (1,000 mLs Intravenous New Bag/Given 06/12/16 1219)    And  0.9 %  sodium chloride infusion (not administered)     Initial Impression / Assessment and Plan / ED Course  I have reviewed the triage vital signs and the nursing notes.  Pertinent labs & imaging results that were available during my care of the patient were reviewed by me and considered in my medical decision making (see chart for details).  Clinical Course  The patient's workup in the emergency room is unremarkable. No evidence to suggest acute cholecystitis, bowel obstruction or other emergency condition. She was able to eat a full meal in the emergency room without any nausea or vomiting. She was given a dose of Toradol for pain. I explained to patient that the penicillin prescription that she had for her dental infection should be inexpensive. She might want to check with a different pharmacy. I will give her refill of her Paxil. I recommend follow-up with a primary care doctor.  Final Clinical Impressions(s) / ED Diagnoses   Final diagnoses:  Right upper quadrant abdominal pain    New Prescriptions Current Discharge Medication List       Dorie Rank, MD 06/12/16 1352

## 2016-06-12 NOTE — ED Notes (Signed)
Communicated with MD and advised all labs were back.  MD advised that patient will be d/c'd.

## 2016-06-12 NOTE — ED Notes (Signed)
[place] IN PROGRESS. WILL COLLECT LABS AFTER.

## 2016-06-12 NOTE — ED Triage Notes (Signed)
Pt c/o a knot in her RUQ x 1 week. No obvious knot upon palpation. Pt sts it feels like a scrubby bristle brush is tearing away at her insides. Pt also sts she can't keep her food down but sts she has been struggling with that for months. Sts she was diagnosed with acid reflux but that she doesn't think that's what it actually is. Pt also sts she is having trouble swallowing. Sts "It's like it takes me awhile to swallow and then once I swallow it takes forever to get to my stomach." pt asking if she will have to tell anyone else the reason why she is here. Pt believes that there's no reason so many people should ask, that they can look at this RN's note. Pt made aware that she will have to talk to a few more staff members in order to get the best care possible.

## 2016-06-12 NOTE — ED Notes (Signed)
Unable to collect labs at this time patient is going to xray 

## 2016-06-13 MED FILL — ?PENICILLIN VK 500 MG TABLE: 500 | 7 days supply | Qty: 28 | Fill #0

## 2016-06-17 MED FILL — PARoxetine HCL 20 MG TABS: 20 | 30 days supply | Qty: 15 | Fill #0

## 2016-06-20 MED FILL — ATENOLOL 50 MG TABLET: 50 | 30 days supply | Qty: 30 | Fill #1

## 2016-06-20 MED FILL — LISINOPRIL-HCTZ 20-12.5 MG: 20-12.5 | 30 days supply | Qty: 30 | Fill #1

## 2016-06-23 ENCOUNTER — Encounter (HOSPITAL_COMMUNITY): Payer: Self-pay | Admitting: Emergency Medicine

## 2016-06-23 ENCOUNTER — Emergency Department (HOSPITAL_COMMUNITY)
Admission: EM | Admit: 2016-06-23 | Discharge: 2016-06-23 | Disposition: A | Discharge status: Home / Self Care | Payer: Self-pay | Attending: Emergency Medicine | Admitting: Emergency Medicine

## 2016-06-23 DIAGNOSIS — F17210 Nicotine dependence, cigarettes, uncomplicated: Secondary | ICD-10-CM | POA: Insufficient documentation

## 2016-06-23 DIAGNOSIS — I159 Secondary hypertension, unspecified: Secondary | ICD-10-CM | POA: Insufficient documentation

## 2016-06-23 MED ORDER — ACETAMINOPHEN 325 MG PO TABS
650.0000 mg | ORAL_TABLET | Freq: Once | ORAL | Status: AC
  Administered 2016-06-23: 650 mg via ORAL
  Filled 2016-06-23: qty 2

## 2016-06-23 MED ORDER — HYDRALAZINE HCL 10 MG PO TABS
10.0000 mg | ORAL_TABLET | Freq: Once | ORAL | Status: AC
  Administered 2016-06-23: 10 mg via ORAL
  Filled 2016-06-23: qty 1

## 2016-06-23 NOTE — ED Provider Notes (Signed)
[facility] DEPT Provider Note   CSN: [id number] Arrival date & time: 06/23/16  [id number]     History   Chief Complaint Chief Complaint  Patient presents with  . Hypertension    HPI Jennifer [name] is a 55 y.o. female.   Hypertension  This is a chronic problem. The current episode started more than 1 week ago. The problem occurs constantly. Associated symptoms include headaches and shortness of breath. Nothing aggravates the symptoms. Relieved by: time.    Past Medical History:  Diagnosis Date  . bipolar   . Depression   . Fibroids    now s/p hysterectomy  . Hypertension   . Left ventricular hypertrophy   . Polysubstance abuse    IV drug [place], cocaine on occassion, smoking and alcoholism, has been going to AA, not completely sober    Patient Active Problem List   Diagnosis Date Noted  . Acute upper respiratory infection 10/01/2015  . Skin lesion of left leg 03/29/2015  . Trigger middle finger of right hand 01/11/2015  . Discoloration of eye 01/11/2015  . Bradycardia 01/11/2015  . Gastritis due to nonsteroidal anti-inflammatory drug (NSAID) 09/11/2014  . Skin lesion of scalp 07/17/2014  . Healthcare maintenance 07/17/2014  . Leg swelling 05/20/2013  . Dental caries 09/29/2012  . Vasomotor rhinitis 08/17/2012  . Upper airway cough syndrome 08/16/2012  . Right leg pain 07/26/2012  . Anxiety 02/26/2012  . Screen for STD (sexually transmitted disease) 10/13/2011  . GERD (gastroesophageal reflux disease) 08/08/2011  . Menopausal symptoms 07/23/2011  . Mastalgia 07/23/2011  . Bipolar disorder, current episode manic without psychotic features, moderate ([place]) 12/05/2010  . Exposure to HIV 12/05/2010  . 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 SEBACEOUS CYST, SCALP 05/06/2010  . SYNOVIAL CYST 05/03/2010  . Pain due to dental caries 12/06/2009  . Abdominal pain 12/06/2009  . DRUG ABUSE, HX OF 12/06/2009  . Lipoma of shoulder 02/06/2009  . FIBROIDS, UTERUS 02/06/2009  .  DRUG ABUSE 02/06/2009  . ESSENTIAL HYPERTENSION, BENIGN 02/06/2009  . PAIN IN JOINT, SHOULDER REGION 02/06/2009  . Insomnia 02/06/2009  . PELVIC MASS 02/06/2009    Past Surgical History:  Procedure Laterality Date  . ABDOMINAL HYSTERECTOMY     2003  . BILATERAL SALPINGOOPHORECTOMY     01/2010  . DILATION AND CURETTAGE OF UTERUS    . EXPLORATORY LAPAROTOMY     complex pelvic mass 2011  . TONSILLECTOMY      OB History    Gravida Para Term Preterm AB Living   5 0 0 0 4 0   SAB TAB Ectopic Multiple Live Births   1 0 0 0         Home Medications    Prior to Admission medications   Medication Sig Start Date End Date Taking? Authorizing Provider  atenolol (TENORMIN) 50 MG tablet Take 1 tablet (50 mg total) by mouth daily. 04/21/16  Yes Jaime Pilcher Ward, PA-C  lisinopril-hydrochlorothiazide (ZESTORETIC) 20-12.5 MG tablet Take 1 tablet by mouth daily. 04/21/16  Yes Ozella Almond Ward, PA-C    Family History Family History  Problem Relation Age of Onset  . Hypertension Mother   . Hypertension Sister   . Diabetes Father     Social History Social History  Substance Use Topics  . Smoking status: Current Every Day Smoker    Packs/day: 0.50    Years: 16.00    Types: Cigarettes  . Smokeless tobacco: Never Used     Comment: Wants to quit.   [name]  Alcohol use No     Allergies   Ibuprofen   Review of Systems Review of Systems  Respiratory: Positive for shortness of breath.   Neurological: Positive for headaches.  All other systems reviewed and are negative.    Physical Exam Updated Vital Signs BP (!) 169/113 (BP Location: Left Arm)   Pulse 70   Temp 98.6 F (37 C) (Oral)   Resp 20   Ht 5\' 4"  (1.626 m)   Wt 213 lb 2 oz (96.7 kg)   LMP 12/04/2009   SpO2 100%   BMI 36.58 kg/m   Physical Exam  Constitutional: She is oriented to person, place, and time. She appears well-developed and well-nourished.  HENT:  Head: Normocephalic and atraumatic.  Eyes:  Conjunctivae and EOM are normal.  Neck: Normal range of motion.  Cardiovascular: Normal rate and regular rhythm.  Exam reveals no friction rub.   No murmur heard. Pulmonary/Chest: Effort normal and breath sounds normal. No stridor. No respiratory distress. She has no wheezes.  Abdominal: She exhibits no distension.  Musculoskeletal: She exhibits no edema or deformity.  Neurological: She is alert and oriented to person, place, and time. No cranial nerve deficit.  No altered mental status, able to give full seemingly accurate history.  Face is symmetric, EOM's intact, pupils equal and reactive, vision intact, tongue and uvula midline without deviation Upper and Lower extremity motor 5/5, intact pain perception in distal extremities, 2+ reflexes in biceps, patella and achilles tendons. Finger to nose normal, heel to shin normal. Walks without assistance or evident ataxia.   Nursing note and vitals reviewed.    ED Treatments / Results  Labs (all labs ordered are listed, but only abnormal results are displayed) Labs Reviewed - No data to display  EKG  EKG Interpretation None       Radiology No results found.  Procedures Procedures (including critical care time)  Medications Ordered in ED Medications  hydrALAZINE (APRESOLINE) tablet 10 mg (not administered)  acetaminophen (TYLENOL) tablet 650 mg (not administered)     Initial Impression / Assessment and Plan / ED Course  I have reviewed the triage vital signs and the nursing notes.  Pertinent labs & imaging results that were available during my care of the patient were reviewed by me and considered in my medical decision making (see chart for details).  Clinical Course    Chronic HTN, slight ha now, requesting 'a pill' to make it better. No e/o end organ damage. Will give hydralazine, tylenol and already has pcp follow up tomorrow.   Final Clinical Impressions(s) / ED Diagnoses   Final diagnoses:  Secondary hypertension      New Prescriptions Current Discharge Medication List       Merrily Pew, MD 06/23/16 [phone]

## 2016-06-23 NOTE — ED Triage Notes (Signed)
Pt sts "trouble controlling her BP" pt refuses to acknowledge this RN or answer any further questions

## 2016-06-23 NOTE — Discharge Instructions (Signed)
Remember to follow up with your primary physician tomorrow as scheduled. The ED is not an appropriate place to seek chronic medication adjustments and is best done by someone who knows you better. However if you have chest pain, difficulty breathing, severe headache or other concern please return for reevaluation.

## 2016-06-23 NOTE — ED Notes (Signed)
Pt states she is here "so the doc can get my blood pressure down today." States her head feels funny and occasionally feels SOB. Pt denies headache or SOB at this time. Pt resp clear, e/u; NAD noted at this time. Pt refuses further assessments with this RN; states she "will wait to talk to the doc."

## 2016-06-23 NOTE — ED Notes (Signed)
Brought patient back to room; gave patient a gown to get changed in to; patient wanted to know why she had to change; explained to patient why; patient stated "well when I was here 2 weeks ago I didn't have to change and people kept asking me the same questions over and over which was annoying so I don't think I'm going to change"; reiterated how important it was so that the medical staff could look at her from head to toe as well and make sure she is okay; patient took the gown at that time and stated "okay. Thank you."

## 2016-06-26 ENCOUNTER — Ambulatory Visit (HOSPITAL_COMMUNITY): Payer: Self-pay

## 2016-06-26 ENCOUNTER — Other Ambulatory Visit (HOSPITAL_COMMUNITY): Payer: Self-pay | Admitting: *Deleted

## 2016-06-26 ENCOUNTER — Encounter: Payer: Self-pay | Admitting: Licensed Clinical Social Worker

## 2016-06-26 ENCOUNTER — Ambulatory Visit: Payer: Self-pay

## 2016-06-26 ENCOUNTER — Ambulatory Visit: Payer: Self-pay | Attending: Internal Medicine | Admitting: Physician Assistant

## 2016-06-26 VITALS — BP 162/112 | HR 69 | Temp 98.8°F | Resp 16 | Wt 210.6 lb

## 2016-06-26 DIAGNOSIS — I10 Essential (primary) hypertension: Secondary | ICD-10-CM | POA: Insufficient documentation

## 2016-06-26 DIAGNOSIS — K219 Gastro-esophageal reflux disease without esophagitis: Secondary | ICD-10-CM | POA: Insufficient documentation

## 2016-06-26 DIAGNOSIS — K029 Dental caries, unspecified: Secondary | ICD-10-CM | POA: Insufficient documentation

## 2016-06-26 DIAGNOSIS — R110 Nausea: Secondary | ICD-10-CM | POA: Insufficient documentation

## 2016-06-26 DIAGNOSIS — F4322 Adjustment disorder with anxiety: Secondary | ICD-10-CM | POA: Insufficient documentation

## 2016-06-26 DIAGNOSIS — R109 Unspecified abdominal pain: Secondary | ICD-10-CM | POA: Insufficient documentation

## 2016-06-26 DIAGNOSIS — Z79899 Other long term (current) drug therapy: Secondary | ICD-10-CM | POA: Insufficient documentation

## 2016-06-26 DIAGNOSIS — N644 Mastodynia: Secondary | ICD-10-CM

## 2016-06-26 MED ORDER — LISINOPRIL-HYDROCHLOROTHIAZIDE 20-12.5 MG PO TABS: 1.0000 | ORAL_TABLET | Freq: Every day | ORAL | 1 refills | Status: DC

## 2016-06-26 MED ORDER — PAROXETINE HCL 20 MG PO TABS: 10.0000 mg | ORAL_TABLET | Freq: Every day | ORAL | 1 refills | Status: DC

## 2016-06-26 MED ORDER — ATENOLOL 50 MG PO TABS: 50.0000 mg | ORAL_TABLET | Freq: Every day | ORAL | 1 refills | Status: DC

## 2016-06-26 MED ORDER — CLONIDINE HCL 0.1 MG PO TABS
0.2000 mg | ORAL_TABLET | Freq: Once | ORAL | Status: AC
  Administered 2016-06-26: 0.2 mg via ORAL

## 2016-06-26 MED ORDER — ONDANSETRON 8 MG PO TBDP: 8.0000 mg | ORAL_TABLET | Freq: Three times a day (TID) | ORAL | 0 refills | Status: DC | PRN

## 2016-06-26 MED FILL — ONDANSETRON ODT 8 MG TABLET: 8 | 6 days supply | Qty: 20 | Fill #0

## 2016-06-26 NOTE — Progress Notes (Signed)
Jennifer [name], is a 55 y.o. female  [id number]  [id number]  DOB - 02/22/1961  Subjective:  Chief Complaint and HPI: Jennifer [name] is a 55 y.o. female here today to establish care and for a follow up visit after being seen in the ED for abdominal pain and uncontrolled htn.  She has been having Nausea and vomiting at night especially now for about 2 years.  She has a history of substance abuse but says she has been sober X 2 months.  She tried AA but didn't find it helpful(but also didn't do the work as suggested by the program.  Says she is living alone in a shed currently.  She is having trouble eating and sometimes feels like food gets stuck.  This has been treated as reflux and heartburn over the past 2 years without relief.  She has intermittent abdominal pain.  She takes BP meds "on and off"-usu due to financial concerns.  She has been out of BP meds for more than a month.  She has also been out of Paxil for more than a month.  She says she feels anxious and she isolates. She c/o R sided face pain; has poor dentition and just finished a course of penicillin for concern of dental abscess.  She denies CP/SOB today.  No vision changes.  Denies SI/HI currently  Recent ED/Hospital notes reviewed.     ROS:   Constitutional:  No f/c, No night sweats, No unexplained weight loss. EENT:  No vision changes, No blurry vision, No hearing changes. No  throat or ear problems. + mouth pain Respiratory: No cough, No SOB Cardiac: No CP, no palpitations GI:  +abd pain, + N/V. No diarrhea/constipation GU: No Urinary s/sx Musculoskeletal: No joint pain Neuro: R sided headache, no dizziness, no motor weakness.  Skin: No rash Endocrine:  No polydipsia. No polyuria.  Psych: Denies SI/HI  No problems updated.  ALLERGIES: Allergies  Allergen Reactions  . Ibuprofen Hives, Nausea And Vomiting and Swelling    REACTION: hives, swelling, breathing issues    PAST MEDICAL HISTORY: Past Medical  History:  Diagnosis Date  . bipolar   . Depression   . Fibroids    now s/p hysterectomy  . Hypertension   . Left ventricular hypertrophy   . Polysubstance abuse    IV drug [place], cocaine on occassion, smoking and alcoholism, has been going to [place], not completely sober    MEDICATIONS AT HOME: Prior to Admission medications   Medication Sig Start Date End Date Taking? Authorizing Provider  atenolol (TENORMIN) 50 MG tablet Take 1 tablet (50 mg total) by mouth daily. 06/26/16  Yes Argentina Donovan, PA-C  lisinopril-hydrochlorothiazide (ZESTORETIC) 20-12.5 MG tablet Take 1 tablet by mouth daily. 06/26/16  Yes Dionne Bucy Josemiguel Gries, PA-C  ondansetron (ZOFRAN-ODT) 8 MG disintegrating tablet Take 1 tablet (8 mg total) by mouth every 8 (eight) hours as needed for nausea or vomiting. 06/26/16   Argentina Donovan, PA-C  PARoxetine (PAXIL) 20 MG tablet Take 0.5 tablets (10 mg total) by mouth daily. 06/26/16   Argentina Donovan, PA-C     Objective:  EXAM:   Vitals:   06/26/16 0903  BP: (!) 184/125  Pulse: 69  Resp: 16  Temp: 98.8 F (37.1 C)  TempSrc: Oral  SpO2: 99%  Weight: 210 lb 9.6 oz (95.5 kg)    General appearance : A&OX3. NAD. Non-toxic-appearing.  Cries at times HEENT: Atraumatic and Normocephalic.  PERRLA. EOM intact.  TM clear B. Mouth-MMM,  post pharynx WNL w/o erythema, No PND.  Extremely poor dentition/broken teeth and dental caries.  No obvious dental abscess Neck: supple, no JVD. No cervical lymphadenopathy. No thyromegaly Chest/Lungs:  Breathing-non-labored, Good air entry bilaterally, breath sounds normal without rales, rhonchi, or wheezing  CVS: S1 S2 regular, no murmurs, gallops, rubs  Abdomen: Bowel sounds present, Non tender and not distended with no gaurding, rigidity or rebound. Extremities: Bilateral Lower Ext shows no edema, both legs are warm to touch with = pulse throughout Neurology:  CN II-XII grossly intact, Non focal.   Psych:  TP linear. J/I WNL. Normal speech.  Appropriate eye contact and affect.  Skin:  No Rash  Data Review Lab Results  Component Value Date   HGBA1C 5.8 02/06/2009     Assessment & Plan   1. Essential hypertension, benign - cloNIDine (CATAPRES) tablet 0.2 mg; Take 2 tablets (0.2 mg total) by mouth once administered in office BP 184/125 down to 162/112 in office Restart immediately- lisinopril-hydrochlorothiazide (ZESTORETIC) 20-12.5 MG tablet; Take 1 tablet by mouth daily.  Dispense: 90 tablet; Refill: 1 - atenolol (TENORMIN) 50 MG tablet; Take 1 tablet (50 mg total) by mouth daily.  Dispense: 90 tablet; Refill: 1  2. Pain due to dental caries Tylenol for pain-to dentist  3. Gastroesophageal reflux disease without esophagitis - H. pylori breath test Consider GI referral as she has failed standard reflux treatment  4. Nausea without vomiting This has been going on X 2 years.  Her weights were reviewed  - H. pylori breath test - ondansetron (ZOFRAN-ODT) 8 MG disintegrating tablet; Take 1 tablet (8 mg total) by mouth every 8 (eight) hours as needed for nausea or vomiting.  Dispense: 20 tablet; Refill: 0  5. Adjustment disorder with anxious mood Counseling with the social worker - PARoxetine (PAXIL) 20 MG tablet; Take 0.5 tablets (10 mg total) by mouth daily.  Dispense: 60 tablet; Refill: 1 I encouraged her to attend AA/12 step meetings and continued sobriety.  Sustained recovery and sobriety will be a big part of her overall health care plan.  I spent >68mins counseling the patient face to face    Patient have been counseled extensively about nutrition and exercise  Return in about 2 weeks (around 07/10/2016) for establish with PCP; f/up stomach issues and anxiety; htn.  The patient was given clear instructions to go to ER or return to medical center if symptoms don't improve, worsen or new problems develop. The patient verbalized understanding. The patient was told to call to get lab results if they haven't heard  anything in the next week.     Freeman Caldron, PA-C [facility], [place]   06/26/2016, 9:53 AMPatient ID: [name], female   DOB: May 13, 1961, 55 y.o.   MRN: [id number]

## 2016-06-26 NOTE — BH Specialist Note (Signed)
Session Start time: 9:50 am   End Time: 10:10 am Total Time:  20 minutes Type of Service: [place]: No.   Interpreter Name & Language: N/A # [facility] Visits July 2017-June 2018: 1st   SUBJECTIVE: Jennifer [name] is a 55 y.o. female  Pt. was referred by Weyman Pedro for:  anxiety and depression. Pt. reports the following symptoms/concerns: nervousness, racing thoughts, difficulty sleeping more than two hours at a time, and withdrawn behavior Duration of problem:  Ongoing. Pt reported anxiety has increased after having a hysterectomy Severity: severe Previous treatment: None reported   OBJECTIVE: Mood: Anxious & Affect: Tearful Risk of harm to self or others: Pt denied SI/HI Assessments administered: PHQ-9; GAD-7  LIFE CONTEXT:  Family & Social: Pt's support consists of an aunt who resides in [name] and sister who resides in [name] (provides minimum emotional support). Pt has a strained relationship with biological mother who resides locally School/ Work: Pt is unemployed Self-Care: Pt has over 25 years of substance use. Pt reported being one month sober with no assistance from an agency, organization, or support group Life changes: Pt is sober from drugs and alcohol for one month What is important to pt/family (values): Spirituality   GOALS ADDRESSED:  Decrease symptoms of depression Decrease symptoms of anxiety  INTERVENTIONS: Motivational Interviewing, Strength-based and Supportive   ASSESSMENT:  Pt currently experiencing depression and anxiety. Pt reported nervousness, racing thoughts, difficulty sleeping more than two hours at a time, and withdrawn behavior. Pt may benefit from psychotherapy and medication management. LCSWA educated pt on the cycle of depression and anxiety, as well, as the importance of implementing healthy coping strategies to decrease symptoms. Pt was able to identify multiple healthy strategies to manage  anxiety and agreed to medication compliance. Pt was provided community resources to assist with sobriety, initiating behavioral health resources, and crisis intervention.     PLAN: 1. F/U with behavioral health clinician: Pt was encouraged to contact LCSWA if symptoms worsen or fail to improve to schedule behavioral appointments at [facility]. 2. Behavioral Health meds: Paxil 3. Behavioral recommendations: LCSWA recommends that pt apply healthy coping skills discussed. Pt is encouraged to schedule follow up appointment with LCSWA 4. Referral: Brief Counseling/Psychotherapy, [employer], Problem-solving teaching/coping strategies, Psychoeducation and Supportive Counseling 5. From scale of 1-10, how likely are you to follow plan: [name], MSW, [place] Worker 06/26/16 5:16 PM  Warmhandoff:   Warm Hand Off Completed.

## 2016-06-26 NOTE — Progress Notes (Signed)
Pt is in the office today for ED follow up on abdominal pain Pt states she is not able to eat or drink Pt sates when she does eat or drink she throws it back out Pt states she may need a referral to GI  Pt states she has bee out of her bp medication for 3 days

## 2016-06-27 ENCOUNTER — Other Ambulatory Visit: Payer: Self-pay | Admitting: Physician Assistant

## 2016-06-27 DIAGNOSIS — R1319 Other dysphagia: Secondary | ICD-10-CM

## 2016-06-27 DIAGNOSIS — R112 Nausea with vomiting, unspecified: Secondary | ICD-10-CM

## 2016-06-27 DIAGNOSIS — R1310 Dysphagia, unspecified: Secondary | ICD-10-CM

## 2016-06-27 LAB — H. PYLORI BREATH TEST: H. PYLORI BREATH TEST: NOT DETECTED

## 2016-06-30 ENCOUNTER — Other Ambulatory Visit: Payer: Self-pay | Admitting: Pharmacist

## 2016-06-30 DIAGNOSIS — I10 Essential (primary) hypertension: Secondary | ICD-10-CM

## 2016-06-30 DIAGNOSIS — R110 Nausea: Secondary | ICD-10-CM

## 2016-06-30 MED ORDER — ONDANSETRON 8 MG PO TBDP: 8.0000 mg | ORAL_TABLET | Freq: Three times a day (TID) | ORAL | 0 refills | Status: DC | PRN

## 2016-06-30 MED ORDER — LISINOPRIL-HYDROCHLOROTHIAZIDE 20-12.5 MG PO TABS: 1.0000 | ORAL_TABLET | Freq: Every day | ORAL | 0 refills | Status: DC

## 2016-07-02 ENCOUNTER — Encounter (HOSPITAL_COMMUNITY): Payer: Self-pay | Admitting: Emergency Medicine

## 2016-07-02 ENCOUNTER — Emergency Department (HOSPITAL_COMMUNITY)
Admission: EM | Admit: 2016-07-02 | Discharge: 2016-07-02 | Disposition: A | Discharge status: Home / Self Care | Payer: Self-pay | Attending: Physician Assistant | Admitting: Physician Assistant

## 2016-07-02 ENCOUNTER — Telehealth: Payer: Self-pay

## 2016-07-02 DIAGNOSIS — Z79899 Other long term (current) drug therapy: Secondary | ICD-10-CM | POA: Insufficient documentation

## 2016-07-02 DIAGNOSIS — I10 Essential (primary) hypertension: Secondary | ICD-10-CM | POA: Insufficient documentation

## 2016-07-02 DIAGNOSIS — F17210 Nicotine dependence, cigarettes, uncomplicated: Secondary | ICD-10-CM | POA: Insufficient documentation

## 2016-07-02 DIAGNOSIS — K0889 Other specified disorders of teeth and supporting structures: Secondary | ICD-10-CM | POA: Insufficient documentation

## 2016-07-02 MED ORDER — ACETAMINOPHEN 325 MG PO TABS: 650.0000 mg | ORAL_TABLET | Freq: Four times a day (QID) | ORAL | 0 refills | Status: DC | PRN

## 2016-07-02 MED ORDER — PENICILLIN V POTASSIUM 500 MG PO TABS: 500.0000 mg | ORAL_TABLET | Freq: Four times a day (QID) | ORAL | 0 refills | Status: DC

## 2016-07-02 MED ORDER — BENZOCAINE 10 % MT GEL: 1.0000 "application " | OROMUCOSAL | 0 refills | Status: DC | PRN

## 2016-07-02 MED FILL — ?PENICILLIN VK 500 MG TABLE: 500 | 6 days supply | Qty: 40 | Fill #0

## 2016-07-02 NOTE — ED Provider Notes (Signed)
[place] DEPT Provider Note   CSN: [id number] Arrival date & time: 07/02/16  [id number]   By signing my name below, I, Evelene Croon, attest that this documentation has been prepared under the direction and in the presence of, Waynetta Pean, PA-C. Electronically Signed: Evelene Croon, Scribe. 07/02/2016. 9:26 AM.   History   Chief Complaint Chief Complaint  Patient presents with  . Dental Pain   The history is provided by the patient. No language interpreter was used.     HPI Comments:  Jennifer [name] is a 55 y.o. female who presents to the Emergency Department complaining of right upper dental pain, intermittent x years, worse over the last 2-3 weeks. She notes radiation of pain into the right ear, and jaw. Pt states she was recently evaluated for the same and was given antibiotics ~ 2 weeks ago which she completed without improvement. She has not attempted to follow up with a dentist.  She denies fever, sore throat, neck pain or vomiting. No pain meds taken. Pt states she cannot afford to follow up with a dentist.    Past Medical History:  Diagnosis Date  . bipolar   . Depression   . Fibroids    now s/p hysterectomy  . Hypertension   . Left ventricular hypertrophy   . Polysubstance abuse    IV drug [place], cocaine on occassion, smoking and alcoholism, has been going to AA, not completely sober    Patient Active Problem List   Diagnosis Date Noted  . Acute upper respiratory infection 10/01/2015  . Skin lesion of left leg 03/29/2015  . Trigger middle finger of right hand 01/11/2015  . Discoloration of eye 01/11/2015  . Bradycardia 01/11/2015  . Gastritis due to nonsteroidal anti-inflammatory drug (NSAID) 09/11/2014  . Skin lesion of scalp 07/17/2014  . Healthcare maintenance 07/17/2014  . Leg swelling 05/20/2013  . Dental caries 09/29/2012  . Vasomotor rhinitis 08/17/2012  . Upper airway cough syndrome 08/16/2012  . Right leg pain 07/26/2012  . Anxiety 02/26/2012    . Screen for STD (sexually transmitted disease) 10/13/2011  . GERD (gastroesophageal reflux disease) 08/08/2011  . Menopausal symptoms 07/23/2011  . Mastalgia 07/23/2011  . Bipolar disorder, current episode manic without psychotic features, moderate ([place]) 12/05/2010  . Exposure to HIV 12/05/2010  . 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 SEBACEOUS CYST, SCALP 05/06/2010  . SYNOVIAL CYST 05/03/2010  . Pain due to dental caries 12/06/2009  . Abdominal pain 12/06/2009  . DRUG ABUSE, HX OF 12/06/2009  . Lipoma of shoulder 02/06/2009  . FIBROIDS, UTERUS 02/06/2009  . DRUG ABUSE 02/06/2009  . ESSENTIAL HYPERTENSION, BENIGN 02/06/2009  . PAIN IN JOINT, SHOULDER REGION 02/06/2009  . Insomnia 02/06/2009  . PELVIC MASS 02/06/2009    Past Surgical History:  Procedure Laterality Date  . ABDOMINAL HYSTERECTOMY     2003  . BILATERAL SALPINGOOPHORECTOMY     01/2010  . DILATION AND CURETTAGE OF UTERUS    . EXPLORATORY LAPAROTOMY     complex pelvic mass 2011  . TONSILLECTOMY      OB History    Gravida Para Term Preterm AB Living   5 0 0 0 4 0   SAB TAB Ectopic Multiple Live Births   1 0 0 0         Home Medications    Prior to Admission medications   Medication Sig Start Date End Date Taking? Authorizing Provider  atenolol (TENORMIN) 50 MG tablet Take 1 tablet (50 mg total) by mouth daily.  06/26/16   Argentina Donovan, PA-C  lisinopril-hydrochlorothiazide (ZESTORETIC) 20-12.5 MG tablet Take 1 tablet by mouth daily. 06/30/16   Argentina Donovan, PA-C  ondansetron (ZOFRAN-ODT) 8 MG disintegrating tablet Take 1 tablet (8 mg total) by mouth every 8 (eight) hours as needed for nausea or vomiting. 06/30/16   Argentina Donovan, PA-C  PARoxetine (PAXIL) 20 MG tablet Take 0.5 tablets (10 mg total) by mouth daily. 06/26/16   Argentina Donovan, PA-C    Family History Family History  Problem Relation Age of Onset  . Hypertension Mother   . Hypertension Sister   . Diabetes Father     Social  History Social History  Substance Use Topics  . Smoking status: Current Every Day Smoker    Packs/day: 0.50    Years: 16.00    Types: Cigarettes  . Smokeless tobacco: Never Used     Comment: Wants to quit.   . Alcohol use No     Allergies   Ibuprofen   Review of Systems Review of Systems  Constitutional: Negative for chills and fever.  HENT: Positive for dental problem and ear pain. Negative for facial swelling and sore throat.   Eyes: Negative for visual disturbance.  Respiratory: Negative for shortness of breath.   Cardiovascular: Negative for chest pain.  Musculoskeletal: Negative for neck pain and neck stiffness.     Physical Exam Updated Vital Signs BP 128/87 (BP Location: Left Arm)   Pulse 81   Temp 98.2 F (36.8 C) (Oral)   Resp 20   LMP 12/04/2009   SpO2 100%   Physical Exam  Constitutional: She appears well-developed and well-nourished. No distress.  Non-toxic appearing.   HENT:  Head: Normocephalic and atraumatic.  Right Ear: External ear normal.  Left Ear: External ear normal.  Mouth/Throat: Oropharynx is clear and moist. No oropharyngeal exudate.  Tenderness to right upper molars Generally poor dentition No trismus; no drooling No discharge from the mouth. No facial swelling.  Uvula is midline without edema. Soft palate rises symmetrically. No tonsillar hypertrophy or exudates. Tongue protrusion is normal.  Bilateral tympanic membranes are pearly-gray without erythema or loss of landmarks.   Eyes: Conjunctivae are normal. Pupils are equal, round, and reactive to light. Right eye exhibits no discharge. Left eye exhibits no discharge.  Neck: Normal range of motion. Neck supple. No JVD present. No tracheal deviation present.  Cardiovascular: Normal rate and intact distal pulses.   Pulmonary/Chest: Effort normal. No respiratory distress.  Lymphadenopathy:    She has no cervical adenopathy.  Neurological: She is alert. No cranial nerve deficit.  Coordination normal.  Skin: Skin is warm and dry. No rash noted. She is not diaphoretic. No erythema. No pallor.  Psychiatric: She has a normal mood and affect. Her behavior is normal.  Nursing note and vitals reviewed.    ED Treatments / Results  DIAGNOSTIC STUDIES:  Oxygen Saturation is 100% on RA, normal by my interpretation.    COORDINATION OF CARE:  9:24 AM Discussed treatment plan with pt at bedside and pt agreed to plan.  Labs (all labs ordered are listed, but only abnormal results are displayed) Labs Reviewed - No data to display  EKG  EKG Interpretation None       Radiology No results found.  Procedures Procedures (including critical care time)  Medications Ordered in ED Medications - No data to display   Initial Impression / Assessment and Plan / ED Course  I have reviewed the triage vital signs and the  nursing notes.  Pertinent labs & imaging results that were available during my care of the patient were reviewed by me and considered in my medical decision making (see chart for details).  Clinical Course      This  is a 55 y.o. female who presents to the Emergency Department complaining of right upper dental pain, intermittent x years, worse over the last 2-3 weeks. She notes radiation of pain into the right ear, and jaw. Pt states she was recently evaluated for the same and was given antibiotics ~ 2 weeks ago which she completed without improvement. She has not attempted to follow up with a dentist. Patient with dentalgia.  No abscess requiring immediate incision and drainage.  Exam not concerning for Ludwig's angina or pharyngeal abscess.  Will treat with PCN VK. Pt instructed to follow-up with dentist. I provided her with dental resources.  Discussed return precautions. Pt safe for discharge. I advised the patient to follow-up with their primary care provider this week. I advised the patient to return to the emergency department with new or worsening  symptoms or new concerns. The patient verbalized understanding and agreement with plan.     Final Clinical Impressions(s) / ED Diagnoses   Final diagnoses:  Pain, dental    New Prescriptions New Prescriptions   No medications on file   I personally performed the services described in this documentation, which was scribed in my presence. The recorded information has been reviewed and is accurate.       Waynetta Pean, PA-C 07/02/16 [place], MD 07/02/16 [id number]

## 2016-07-02 NOTE — ED Triage Notes (Signed)
Rt upper tooth pain for 4 days hurts in her jaw

## 2016-07-02 NOTE — Telephone Encounter (Signed)
Contacted pt to go over lab results pt vm is not set up was unable to lvm

## 2016-07-17 ENCOUNTER — Ambulatory Visit (HOSPITAL_COMMUNITY): Payer: Self-pay

## 2016-07-17 ENCOUNTER — Other Ambulatory Visit: Payer: Self-pay

## 2016-07-27 ENCOUNTER — Encounter (HOSPITAL_COMMUNITY): Payer: Self-pay | Admitting: Emergency Medicine

## 2016-07-27 ENCOUNTER — Emergency Department (HOSPITAL_COMMUNITY)
Admission: EM | Admit: 2016-07-27 | Discharge: 2016-07-27 | Disposition: A | Discharge status: Home / Self Care | Payer: Self-pay | Attending: Dermatology | Admitting: Dermatology

## 2016-07-27 DIAGNOSIS — Z5321 Procedure and treatment not carried out due to patient leaving prior to being seen by health care provider: Secondary | ICD-10-CM | POA: Insufficient documentation

## 2016-07-27 DIAGNOSIS — F17210 Nicotine dependence, cigarettes, uncomplicated: Secondary | ICD-10-CM | POA: Insufficient documentation

## 2016-07-27 DIAGNOSIS — I10 Essential (primary) hypertension: Secondary | ICD-10-CM | POA: Insufficient documentation

## 2016-07-27 NOTE — ED Triage Notes (Signed)
Pt sts she is leaving because her BP is good

## 2016-07-27 NOTE — ED Triage Notes (Signed)
Pt here and sts out of BP meds x 4 days

## 2016-07-28 ENCOUNTER — Encounter: Payer: Self-pay | Admitting: Family Medicine

## 2016-07-28 ENCOUNTER — Ambulatory Visit: Payer: Self-pay | Attending: Family Medicine | Admitting: Family Medicine

## 2016-07-28 VITALS — BP 153/93 | HR 73 | Temp 97.9°F | Ht 64.0 in | Wt 209.2 lb

## 2016-07-28 DIAGNOSIS — H9201 Otalgia, right ear: Secondary | ICD-10-CM | POA: Insufficient documentation

## 2016-07-28 DIAGNOSIS — Z79899 Other long term (current) drug therapy: Secondary | ICD-10-CM | POA: Insufficient documentation

## 2016-07-28 DIAGNOSIS — I10 Essential (primary) hypertension: Secondary | ICD-10-CM | POA: Insufficient documentation

## 2016-07-28 DIAGNOSIS — S025XXA Fracture of tooth (traumatic), initial encounter for closed fracture: Secondary | ICD-10-CM | POA: Insufficient documentation

## 2016-07-28 DIAGNOSIS — K1370 Unspecified lesions of oral mucosa: Secondary | ICD-10-CM | POA: Insufficient documentation

## 2016-07-28 DIAGNOSIS — Z23 Encounter for immunization: Secondary | ICD-10-CM | POA: Insufficient documentation

## 2016-07-28 DIAGNOSIS — Z1231 Encounter for screening mammogram for malignant neoplasm of breast: Secondary | ICD-10-CM

## 2016-07-28 DIAGNOSIS — K219 Gastro-esophageal reflux disease without esophagitis: Secondary | ICD-10-CM | POA: Insufficient documentation

## 2016-07-28 DIAGNOSIS — K05219 Aggressive periodontitis, localized, unspecified severity: Secondary | ICD-10-CM | POA: Insufficient documentation

## 2016-07-28 DIAGNOSIS — F4322 Adjustment disorder with anxiety: Secondary | ICD-10-CM | POA: Insufficient documentation

## 2016-07-28 DIAGNOSIS — M7989 Other specified soft tissue disorders: Secondary | ICD-10-CM | POA: Insufficient documentation

## 2016-07-28 DIAGNOSIS — F17210 Nicotine dependence, cigarettes, uncomplicated: Secondary | ICD-10-CM | POA: Insufficient documentation

## 2016-07-28 DIAGNOSIS — X58XXXA Exposure to other specified factors, initial encounter: Secondary | ICD-10-CM | POA: Insufficient documentation

## 2016-07-28 DIAGNOSIS — R220 Localized swelling, mass and lump, head: Secondary | ICD-10-CM | POA: Insufficient documentation

## 2016-07-28 MED ORDER — PAROXETINE HCL 20 MG PO TABS: 20.0000 mg | ORAL_TABLET | Freq: Every day | ORAL | 5 refills | Status: DC

## 2016-07-28 MED ORDER — METOPROLOL SUCCINATE ER 50 MG PO TB24: 50.0000 mg | ORAL_TABLET | Freq: Every day | ORAL | 3 refills | Status: DC

## 2016-07-28 MED ORDER — PANTOPRAZOLE SODIUM 40 MG PO TBEC: 40.0000 mg | DELAYED_RELEASE_TABLET | Freq: Every day | ORAL | 3 refills | Status: DC

## 2016-07-28 MED ORDER — AMOXICILLIN-POT CLAVULANATE 875-125 MG PO TABS: 1.0000 | ORAL_TABLET | Freq: Two times a day (BID) | ORAL | 0 refills | Status: DC

## 2016-07-28 MED FILL — PARoxetine HCL 20 MG TABS: 20 | 30 days supply | Qty: 30 | Fill #0

## 2016-07-28 MED FILL — AMOX-CLAV 875-125 MG TABLET: 875-125 | 10 days supply | Qty: 20 | Fill #0

## 2016-07-28 MED FILL — METOPROLOL SUCC ER 50 MG TA: 50 | 30 days supply | Qty: 30 | Fill #0

## 2016-07-28 NOTE — Assessment & Plan Note (Signed)
Gingival abscess Treat with Augmentin Advised dental evaluation patient will need extraction of molars

## 2016-07-28 NOTE — Assessment & Plan Note (Signed)
Hx of esophagitis Have PM abdominal pain and emesis if she eats too late Continue prn zofran Add protonix 40 mg 30 minutes before supper

## 2016-07-28 NOTE — Progress Notes (Signed)
Pt is here today for a infection in the mouth. Pt also states that she has a knot under her arm.

## 2016-07-28 NOTE — Patient Instructions (Addendum)
Jennifer [name] was seen today for mouth lesions and mass.  Diagnoses and all orders for this visit:  Gingival abscess -     amoxicillin-clavulanate (AUGMENTIN) 875-125 MG tablet; Take 1 tablet by mouth 2 (two) times daily.  Adjustment disorder with anxious mood -     PARoxetine (PAXIL) 20 MG tablet; Take 1 tablet (20 mg total) by mouth daily.  Essential hypertension, benign -     metoprolol succinate (TOPROL-XL) 50 MG 24 hr tablet; Take 1 tablet (50 mg total) by mouth daily. Take with or immediately following a meal.  Gastroesophageal reflux disease, esophagitis presence not specified -     pantoprazole (PROTONIX) 40 MG tablet; Take 1 tablet (40 mg total) by mouth daily before supper.  restarts prinzide and  Changed atenolol to metoprolol because atenolol is on back order nationally and not available Eat a low salt diet  Augmentin for abscess, you should start to feel better in 24 hrs after taking the antibiotic  I see you have allergy to ibuprofen, take tylenol for pain   F/u with me in 2 weeks for BP check and recheck of oral abscess   Dr. Adrian Blackwater

## 2016-07-28 NOTE — Assessment & Plan Note (Signed)
Chronic anxiety Tolerating paxil Increase paxil to 20 mg daily

## 2016-07-28 NOTE — Assessment & Plan Note (Signed)
Hypertensive due to med non compliance Plan: Changed atenolol to metoprolol due to national backorder Continue prinzide  Patient advised to pick meds ASAP Close f/u for HTN

## 2016-07-28 NOTE — Progress Notes (Signed)
Subjective:  [name], female    DOB: Nov 23, 1960  Age: 55 y.o. MRN: [id number]  CC: Mouth Lesions and Mass   HPI DARRAH KAMIENSKI has hx of HTN and mood disorder, she is unemployed and uninsured  she presents for   1.  Gingival abscess: she has R lower pain and swelling for past 2 months. She has chronically poor dentition with broken teeth and carries. She is a smoker. She denies fever or chills. She has pain when she eats, R sided ear pain, change in voice and R sided jaw swelling. She endorsed swelling improved following rinsing her mouth with bleach.   2. HTN: not taking prinzide or atenolol. Reports that she does not have the funds to pick up her medications.   3. Swelling in R axilla: come and goes. 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 The swelling in her R axilla is non tender. No drainage or redness. She had a normal screening mammogram on 03/13/2015.   4. Mood disorder: she endorses anxiety. She is compliant with paxil. She request dose increase to 20 mg daily. She denies SI.   Social History  Substance Use Topics  . Smoking status: Current Every Day Smoker    Packs/day: 0.50    Years: 16.00    Types: Cigarettes  . Smokeless tobacco: Never Used     Comment: Wants to quit.   . Alcohol use No   Outpatient Medications Prior to Visit  Medication Sig Dispense Refill  . acetaminophen (TYLENOL) 325 MG tablet Take 2 tablets (650 mg total) by mouth every 6 (six) hours as needed for mild pain or moderate pain. 60 tablet 0  . atenolol (TENORMIN) 50 MG tablet Take 1 tablet (50 mg total) by mouth daily. 90 tablet 1  . benzocaine (ORAJEL) 10 % mucosal gel Use as directed 1 application in the mouth or throat as needed for mouth pain. 7 g 0  . lisinopril-hydrochlorothiazide (ZESTORETIC) 20-12.5 MG tablet Take 1 tablet by mouth daily. 30 tablet 0  . ondansetron (ZOFRAN-ODT) 8 MG disintegrating tablet Take 1 tablet (8 mg total) by mouth every 8  (eight) hours as needed for nausea or vomiting. 20 tablet 0  . PARoxetine (PAXIL) 20 MG tablet Take 0.5 tablets (10 mg total) by mouth daily. 60 tablet 1  . penicillin v potassium (VEETID) 500 MG tablet Take 1 tablet (500 mg total) by mouth 4 (four) times daily. (Patient not taking: Reported on 07/28/2016) 40 tablet 0   No facility-administered medications prior to visit.     ROS Review of Systems  Constitutional: Negative for chills and fever.  HENT: Positive for dental problem and mouth sores.   Eyes: Negative for visual disturbance.  Respiratory: Negative for shortness of breath.   Cardiovascular: Negative for chest pain.  Gastrointestinal: Negative for abdominal pain and blood in stool.  Musculoskeletal: Negative for arthralgias and back pain.  Skin: Negative for rash.  Allergic/Immunologic: Negative for immunocompromised state.  Hematological: Negative for adenopathy. Does not bruise/bleed easily.  Psychiatric/Behavioral: Positive for sleep disturbance. Negative for dysphoric mood. The patient is nervous/anxious.     Objective:  BP (!) 153/93 (BP Location: Left Arm, Patient Position: Sitting, Cuff Size: Small)   Pulse 73   Temp 97.9 F (36.6 C) (Oral)   Ht 5\' 4"  (1.626 m)   Wt 209 lb 3.2 oz (94.9 kg)   LMP 12/04/2009   SpO2 97%   BMI 35.91 kg/m  BP/Weight 07/28/2016 07/27/2016 [id number]  Systolic BP 0000000 [id number] 0000000  Diastolic BP 93 88 87  Wt. (Lbs) 209.2 - -  BMI 35.91 - -   Wt Readings from Last 3 Encounters:  07/28/16 209 lb 3.2 oz (94.9 kg)  06/26/16 210 lb 9.6 oz (95.5 kg)  06/23/16 213 lb 2 oz (96.7 kg)    Physical Exam  Constitutional: She is oriented to person, place, and time. She appears well-developed and well-nourished. No distress.  HENT:  Head: Normocephalic and atraumatic.  Right Ear: Tympanic membrane, external ear and ear canal normal.  Left Ear: Tympanic membrane normal.  Mouth/Throat: Abnormal dentition. Dental abscesses and dental caries  present.    Cardiovascular: Normal rate, regular rhythm, normal heart sounds and intact distal pulses.   Pulmonary/Chest: Effort normal and breath sounds normal. Right breast exhibits no inverted nipple, no mass, no nipple discharge, no skin change and no tenderness. Left breast exhibits no inverted nipple, no mass, no nipple discharge, no skin change and no tenderness. Breasts are symmetrical.  Musculoskeletal: She exhibits no edema.       Arms: Neurological: She is alert and oriented to person, place, and time.  Skin: Skin is warm and dry. No rash noted.  Psychiatric: She has a normal mood and affect.    Depression screen [facility] 2/9 07/28/2016 06/26/2016 02/13/2016  Decreased Interest 0 3 0  Down, Depressed, Hopeless 0 2 0  PHQ - 2 Score 0 5 0  Altered sleeping 3 3 -  Tired, decreased energy 3 3 -  Change in appetite 0 3 -  Feeling bad or failure about yourself  0 0 -  Trouble concentrating 0 0 -  Moving slowly or fidgety/restless 0 0 -  Suicidal thoughts 0 0 -  PHQ-9 Score 6 14 -  Difficult doing work/chores - - -  Some recent data might be hidden   GAD 7 : Generalized Anxiety Score 07/28/2016 06/26/2016  Nervous, Anxious, on Edge 3 3  Control/stop worrying 0 3  Worry too much - different things 0 3  Trouble relaxing 3 3  Restless 3 3  Easily annoyed or irritable 3 3  Afraid - awful might happen 0 3  Total GAD 7 Score 12 21    Assessment & Plan:  Phynix was seen today for mouth lesions and mass.  Diagnoses and all orders for this visit:  Gingival abscess -     amoxicillin-clavulanate (AUGMENTIN) 875-125 MG tablet; Take 1 tablet by mouth 2 (two) times daily.  Adjustment disorder with anxious mood -     PARoxetine (PAXIL) 20 MG tablet; Take 1 tablet (20 mg total) by mouth daily.  Essential hypertension, benign -     metoprolol succinate (TOPROL-XL) 50 MG 24 hr tablet; Take 1 tablet (50 mg total) by mouth daily. Take with or immediately following a  meal.  Gastroesophageal reflux disease, esophagitis presence not specified -     pantoprazole (PROTONIX) 40 MG tablet; Take 1 tablet (40 mg total) by mouth daily before supper.  Visit for screening mammogram -     MM DIGITAL SCREENING BILATERAL; Future  Other orders -     Flu Vaccine QUAD 36+ mos IM   There are no diagnoses linked to this encounter.  No orders of the defined types were placed in this encounter.   Follow-up: No Follow-up on file.   Boykin Nearing MD

## 2016-08-12 ENCOUNTER — Encounter: Payer: Self-pay | Admitting: Family Medicine

## 2016-08-12 ENCOUNTER — Ambulatory Visit: Payer: Self-pay | Attending: Family Medicine | Admitting: Family Medicine

## 2016-08-12 VITALS — BP 156/93 | HR 57 | Temp 98.4°F | Ht 64.0 in | Wt 206.8 lb

## 2016-08-12 DIAGNOSIS — F17200 Nicotine dependence, unspecified, uncomplicated: Secondary | ICD-10-CM

## 2016-08-12 DIAGNOSIS — Z5900 Homelessness unspecified: Secondary | ICD-10-CM | POA: Insufficient documentation

## 2016-08-12 DIAGNOSIS — K219 Gastro-esophageal reflux disease without esophagitis: Secondary | ICD-10-CM

## 2016-08-12 DIAGNOSIS — F419 Anxiety disorder, unspecified: Secondary | ICD-10-CM | POA: Insufficient documentation

## 2016-08-12 DIAGNOSIS — F17210 Nicotine dependence, cigarettes, uncomplicated: Secondary | ICD-10-CM | POA: Insufficient documentation

## 2016-08-12 DIAGNOSIS — Z79899 Other long term (current) drug therapy: Secondary | ICD-10-CM | POA: Insufficient documentation

## 2016-08-12 DIAGNOSIS — I10 Essential (primary) hypertension: Secondary | ICD-10-CM

## 2016-08-12 MED FILL — LISINOPRIL-HCTZ 20-12.5 MG: 20-12.5 | 30 days supply | Qty: 30 | Fill #0

## 2016-08-12 NOTE — Progress Notes (Signed)
Pt is here to follow up HTN.

## 2016-08-12 NOTE — Progress Notes (Addendum)
Subjective:  [name], female    DOB: 1960/10/14  Age: 56 y.o. MRN: [id number]  CC: Hypertension   HPI Jennifer [name] has hx of HTN and mood disorder, she is unemployed, uninsured and homeless she presents for   1.  Gingival abscess: improved. She took the Augmentin.  No oral pain.   2. HTN: not taking prinzide. She has started metoprolol. She reports that she does not have the funds to pick up her medications. She is homeless and living in a shed. She also does not have anyone to verify her homeless status and reports this verification is needed to qualify for the orange card.   3. Mood disorder: she endorses anxiety. She is compliant with paxil 20 mg daily. Reports improved mood with paxil.   4. GERD: has nightly cough. Wakes up with coughing and reflux sensation. No fever, chills, weight loss or abdominal pain. She takes Protonix as needed only. She has never had screening c-scope. She reports fear of cancer.   Social History  Substance Use Topics  . Smoking status: Current Every Day Smoker    Packs/day: 0.50    Years: 16.00    Types: Cigarettes  . Smokeless tobacco: Never Used     Comment: Wants to quit.   . Alcohol use No   Outpatient Medications Prior to Visit  Medication Sig Dispense Refill  . acetaminophen (TYLENOL) 325 MG tablet Take 2 tablets (650 mg total) by mouth every 6 (six) hours as needed for mild pain or moderate pain. 60 tablet 0  . metoprolol succinate (TOPROL-XL) 50 MG 24 hr tablet Take 1 tablet (50 mg total) by mouth daily. Take with or immediately following a meal. 90 tablet 3  . ondansetron (ZOFRAN-ODT) 8 MG disintegrating tablet Take 1 tablet (8 mg total) by mouth every 8 (eight) hours as needed for nausea or vomiting. 20 tablet 0  . PARoxetine (PAXIL) 20 MG tablet Take 1 tablet (20 mg total) by mouth daily. 30 tablet 5  . amoxicillin-clavulanate (AUGMENTIN) 875-125 MG tablet Take 1 tablet by mouth 2 (two) times daily. (Patient not  taking: Reported on 08/12/2016) 20 tablet 0  . benzocaine (ORAJEL) 10 % mucosal gel Use as directed 1 application in the mouth or throat as needed for mouth pain. (Patient not taking: Reported on 08/12/2016) 7 g 0  . lisinopril-hydrochlorothiazide (ZESTORETIC) 20-12.5 MG tablet Take 1 tablet by mouth daily. (Patient not taking: Reported on 08/12/2016) 30 tablet 0  . pantoprazole (PROTONIX) 40 MG tablet Take 1 tablet (40 mg total) by mouth daily before supper. (Patient not taking: Reported on 08/12/2016) 30 tablet 3   No facility-administered medications prior to visit.     ROS Review of Systems  Constitutional: Negative for chills and fever.  HENT: Positive for dental problem. Negative for mouth sores.   Eyes: Negative for visual disturbance.  Respiratory: Positive for cough. Negative for shortness of breath.   Cardiovascular: Negative for chest pain.  Gastrointestinal: Negative for abdominal pain and blood in stool.  Musculoskeletal: Negative for arthralgias and back pain.  Skin: Negative for rash.  Allergic/Immunologic: Negative for immunocompromised state.  Hematological: Negative for adenopathy. Does not bruise/bleed easily.  Psychiatric/Behavioral: Positive for sleep disturbance. Negative for dysphoric mood. The patient is nervous/anxious.      Objective:  BP (!) 156/93 (BP Location: Left Arm, Patient Position: Sitting, Cuff Size: Small)   Pulse (!) 57   Temp 98.4 F (36.9 C) (Oral)   Ht 5\' 4"  (  1.626 m)   Wt 206 lb 12.8 oz (93.8 kg)   LMP 12/04/2009   SpO2 96%   BMI 35.50 kg/m   BP/Weight 08/12/2016 07/28/2016 [id number]  Systolic BP [id number] 0000000 [id number]  Diastolic BP 93 93 88  Wt. (Lbs) 206.8 209.2 -  BMI 35.5 35.91 -   Wt Readings from Last 3 Encounters:  08/12/16 206 lb 12.8 oz (93.8 kg)  07/28/16 209 lb 3.2 oz (94.9 kg)  06/26/16 210 lb 9.6 oz (95.5 kg)   Pulse Readings from Last 3 Encounters:  08/12/16 (!) 57  07/28/16 73  07/27/16 92     Physical Exam  Constitutional:  She is oriented to person, place, and time. She appears well-developed and well-nourished. No distress.  HENT:  Head: Normocephalic and atraumatic.  Right Ear: Tympanic membrane, external ear and ear canal normal.  Left Ear: Tympanic membrane normal.  Mouth/Throat: Abnormal dentition. Dental caries present. No dental abscesses.  Cardiovascular: Normal rate, regular rhythm, normal heart sounds and intact distal pulses.   Pulmonary/Chest: Effort normal and breath sounds normal. Right breast exhibits no inverted nipple, no mass, no nipple discharge, no skin change and no tenderness. Left breast exhibits no inverted nipple, no mass, no nipple discharge, no skin change and no tenderness. Breasts are symmetrical.  Abdominal: Soft. Bowel sounds are normal. She exhibits no distension and no mass. There is no tenderness. There is no rebound and no guarding.  Musculoskeletal: She exhibits no edema.       Arms: Neurological: She is alert and oriented to person, place, and time.  Skin: Skin is warm and dry. No rash noted.  Psychiatric: She has a normal mood and affect.    Depression screen [facility] 2/9 07/28/2016 06/26/2016 02/13/2016  Decreased Interest 0 3 0  Down, Depressed, Hopeless 0 2 0  PHQ - 2 Score 0 5 0  Altered sleeping 3 3 -  Tired, decreased energy 3 3 -  Change in appetite 0 3 -  Feeling bad or failure about yourself  0 0 -  Trouble concentrating 0 0 -  Moving slowly or fidgety/restless 0 0 -  Suicidal thoughts 0 0 -  PHQ-9 Score 6 14 -  Difficult doing work/chores - - -  Some recent data might be hidden   GAD 7 : Generalized Anxiety Score 07/28/2016 06/26/2016  Nervous, Anxious, on Edge 3 3  Control/stop worrying 0 3  Worry too much - different things 0 3  Trouble relaxing 3 3  Restless 3 3  Easily annoyed or irritable 3 3  Afraid - awful might happen 0 3  Total GAD 7 Score 12 21    Assessment & Plan:  Jennifer [name] was seen today for hypertension.  Diagnoses and all orders for this  visit:  Gastroesophageal reflux disease, esophagitis presence not specified -     Ambulatory referral to Gastroenterology  Current smoker  Homeless single person  Essential hypertension, benign   There are no diagnoses linked to this encounter.  No orders of the defined types were placed in this encounter.   Follow-up: Return in about 4 weeks (around 09/09/2016) for HTN .   Boykin Nearing MD

## 2016-08-12 NOTE — Assessment & Plan Note (Signed)
GERD Nightly protonix GI referral primarily for screening colonoscopy

## 2016-08-12 NOTE — Assessment & Plan Note (Addendum)
Uncontrolled  Compliant with metoprolol  Continue metoprolol Start prinzide

## 2016-08-12 NOTE — Patient Instructions (Addendum)
Jennifer [name] was seen today for hypertension.  Diagnoses and all orders for this visit:  Gastroesophageal reflux disease, esophagitis presence not specified -     Ambulatory referral to Gastroenterology  Current smoker  Homeless single person   Start prinzide Take protonix nightly Start patch for smoking cessation   Smoking cessation support: smoking cessation hotline: 1-800-QUIT-NOW.  Smoking cessation classes are available through [facility]. Call [phone] or visit our website at [email or web address]  F/u in 4 weeks for HTN  Dr. Adrian Blackwater

## 2016-08-12 NOTE — Assessment & Plan Note (Signed)
Cessation addressed  

## 2016-08-28 ENCOUNTER — Ambulatory Visit: Payer: Self-pay | Admitting: Family Medicine

## 2016-09-02 ENCOUNTER — Telehealth: Payer: Self-pay | Admitting: *Deleted

## 2016-09-02 ENCOUNTER — Ambulatory Visit: Payer: Self-pay | Admitting: Family Medicine

## 2016-09-02 MED FILL — METOPROLOL SUCC ER 50 MG TA: 50 | 30 days supply | Qty: 30 | Fill #1

## 2016-09-02 NOTE — Telephone Encounter (Signed)
Pt arrived to [facility] states she was feeling funny and wanted to  Have BP checked. Pt BP checked manually with large cuff size.   BP- 136/88 P:74 R: 20 SpO2 96%RA.  Pt alert and oriented x 4. Denies chest pain, SOB, Lower extremity swelling. Pt states she is here to pick up prescription for BP medication. She states she has a BP machine at home. Encourage patient to take BP at home. She stated she is going to make an appointment to see provider. Did not think appointment was necessary today.

## 2016-09-02 NOTE — Telephone Encounter (Signed)
Pt states she didn't feel well and wanted to have her BP checked.  Denies chest pian, dizziness, blurred vision,  HR; 74 96% RA R: 20

## 2016-09-04 MED FILL — PARoxetine HCL 20 MG TABS: 20 | 30 days supply | Qty: 30 | Fill #1

## 2016-09-26 MED FILL — METOPROLOL SUCC ER 50 MG TA: 50 | 30 days supply | Qty: 30 | Fill #2

## 2016-09-26 MED FILL — ?ZESTORETIC 20-12.5 MG TAB: 20-12.5 | 30 days supply | Qty: 30 | Fill #1

## 2016-09-26 MED FILL — PARoxetine HCL 20 MG TABS: 20 | 30 days supply | Qty: 30 | Fill #2

## 2016-09-30 ENCOUNTER — Ambulatory Visit: Payer: Self-pay | Attending: Family Medicine | Admitting: Family Medicine

## 2016-09-30 ENCOUNTER — Encounter: Payer: Self-pay | Admitting: Family Medicine

## 2016-09-30 ENCOUNTER — Other Ambulatory Visit: Payer: Self-pay | Admitting: Family Medicine

## 2016-09-30 VITALS — BP 170/106 | HR 76 | Temp 98.2°F | Ht 64.0 in | Wt 198.6 lb

## 2016-09-30 DIAGNOSIS — I10 Essential (primary) hypertension: Secondary | ICD-10-CM | POA: Insufficient documentation

## 2016-09-30 DIAGNOSIS — N644 Mastodynia: Secondary | ICD-10-CM | POA: Insufficient documentation

## 2016-09-30 DIAGNOSIS — R1011 Right upper quadrant pain: Secondary | ICD-10-CM | POA: Insufficient documentation

## 2016-09-30 LAB — COMPLETE METABOLIC PANEL WITH GFR
ALBUMIN: 4.2 g/dL (ref 3.6–5.1)
ALK PHOS: 62 U/L (ref 33–130)
ALT: 18 U/L (ref 6–29)
AST: 19 U/L (ref 10–35)
BUN: 17 mg/dL (ref 7–25)
CALCIUM: 10.1 mg/dL (ref 8.6–10.4)
CO2: 25 mmol/L (ref 20–31)
CREATININE: 1.15 mg/dL — AB (ref 0.50–1.05)
Chloride: 106 mmol/L (ref 98–110)
GFR, Est African American: 63 mL/min (ref >=60)
GFR, Est Non African American: 54 mL/min — ABNORMAL LOW (ref >=60)
Glucose, Bld: 91 mg/dL (ref 65–99)
Potassium: 4.5 mmol/L (ref 3.5–5.3)
Sodium: 140 mmol/L (ref 135–146)
Total Bilirubin: 0.5 mg/dL (ref 0.2–1.2)
Total Protein: 7.2 g/dL (ref 6.1–8.1)

## 2016-09-30 MED ORDER — PANTOPRAZOLE SODIUM 40 MG PO TBEC: 40.0000 mg | DELAYED_RELEASE_TABLET | Freq: Every day | ORAL | 3 refills | Status: DC

## 2016-09-30 MED ORDER — ONDANSETRON 8 MG PO TBDP: 8.0000 mg | ORAL_TABLET | Freq: Three times a day (TID) | ORAL | 0 refills | Status: DC | PRN

## 2016-09-30 MED ORDER — ONDANSETRON 4 MG PO TBDP
8.0000 mg | ORAL_TABLET | Freq: Once | ORAL | Status: AC
  Administered 2016-09-30: 8 mg via ORAL

## 2016-09-30 MED ORDER — ACETAMINOPHEN 500 MG PO TABS
1000.0000 mg | ORAL_TABLET | Freq: Once | ORAL | Status: AC
  Administered 2016-09-30: 1000 mg via ORAL

## 2016-09-30 NOTE — Assessment & Plan Note (Signed)
Patient with R breast pain Has hx of pain  Last negative screening mammogram 03/2015  Plan: Repeat mammogram and R breast ultrasound

## 2016-09-30 NOTE — Assessment & Plan Note (Signed)
Uncontrolled BP in setting of medication non compliance Plan: Refilled zestoretic and metoprolol

## 2016-09-30 NOTE — Patient Instructions (Addendum)
Daelin was seen today for hypertension.  Diagnoses and all orders for this visit:  Right upper quadrant abdominal pain -     ondansetron (ZOFRAN-ODT) disintegrating tablet 8 mg; Take 2 tablets (8 mg total) by mouth once. -     acetaminophen (TYLENOL) tablet 1,000 mg; Take 2 tablets (1,000 mg total) by mouth once. -     COMPLETE METABOLIC PANEL WITH GFR -     CBC -     Cancel: US Abdomen Complete; Future -     Lipase -     ondansetron (ZOFRAN-ODT) 8 MG disintegrating tablet; Take 1 tablet (8 mg total) by mouth every 8 (eight) hours as needed for nausea or vomiting. -     pantoprazole (PROTONIX) 40 MG tablet; Take 1 tablet (40 mg total) by mouth daily before supper.  Breast pain, right -     MM DIAG BREAST TOMO BILATERAL; Future -     US BREAST LTD UNI RIGHT INC AXILLA; Future -     NM HEPATOBILIARY INCLUDING GB; Future  Please call Rolena Infante, [phone],  with the [facility] (breast and cervical cancer control program) at the [facility] to set up an appointment to verify eligibility for a breast exam, mammogram, ultrasound. If you qualify this will be set up at [facility] and labs done in 06/2016 for same symptoms were negative Labs ordered again A f/u scan to evaluate your gallbladder in more detail has been ordered  Use zofran for nause and Protonix for any acid reflux   F/u in 2 weeks for BP check with clinical pharmacologist  F/u with me in 4 weeks for pain in breast and right side   Dr. Adrian Blackwater

## 2016-09-30 NOTE — Progress Notes (Signed)
Subjective:  [name], female    DOB: 10-21-1960  Age: 56 y.o. MRN: [id number]  CC: Hypertension   HPI Jennifer [name] has hx of HTN and mood disorder, she is unemployed, uninsured and homeless she presents for   1. HTN: she was out of BP medicine for 3 days. She picked up and took her medicine this AM at onset of visit.   2. R breast and R upper quadrant pain: x  75months. Pain associated with nausea and emesis in R breast and R upper quadrant. No swelling. She has R shoulder lipoma since 2010 that is tender at time but not enlarging. No fever or chills. She has hx of alcohol abuse and GERD. She denies ETOH, THC and street drugs. She admits to smoking. She complained of the same symptoms in 06/2016 and had normal CMP, lipase, CBC. Diagnostic R breast ultrasound and bilateral mammogram has been scheduled but not done due to lack of insurance.   Abdominal ultrasound 06/12/2016: IMPRESSION: 1. No gallstones or sonographic evidence of acute cholecystitis. If there are clinical concerns of chronic cholecystitis, a nuclea medicine hepatobiliary scan with gallbladder ejection fraction determination may be useful. 2. No intra-abdominal abnormality is observed.  Social History  Substance Use Topics  . Smoking status: Current Every Day Smoker    Packs/day: 0.50    Years: 16.00    Types: Cigarettes  . Smokeless tobacco: Never Used     Comment: Wants to quit.   . Alcohol use No   Outpatient Medications Prior to Visit  Medication Sig Dispense Refill  . lisinopril-hydrochlorothiazide (ZESTORETIC) 20-12.5 MG tablet Take 1 tablet by mouth daily. 30 tablet 0  . metoprolol succinate (TOPROL-XL) 50 MG 24 hr tablet Take 1 tablet (50 mg total) by mouth daily. Take with or immediately following a meal. 90 tablet 3  . PARoxetine (PAXIL) 20 MG tablet Take 1 tablet (20 mg total) by mouth daily. 30 tablet 5  . acetaminophen (TYLENOL) 325 MG tablet Take 2 tablets (650 mg total) by mouth  every 6 (six) hours as needed for mild pain or moderate pain. (Patient not taking: Reported on 09/30/2016) 60 tablet 0  . ondansetron (ZOFRAN-ODT) 8 MG disintegrating tablet Take 1 tablet (8 mg total) by mouth every 8 (eight) hours as needed for nausea or vomiting. (Patient not taking: Reported on 09/30/2016) 20 tablet 0  . pantoprazole (PROTONIX) 40 MG tablet Take 1 tablet (40 mg total) by mouth daily before supper. (Patient not taking: Reported on 08/12/2016) 30 tablet 3   No facility-administered medications prior to visit.     ROS Review of Systems  Constitutional: Negative for chills and fever.  HENT: Negative for dental problem and mouth sores.   Eyes: Negative for visual disturbance.  Respiratory: Negative for cough and shortness of breath.   Cardiovascular: Negative for chest pain.  Gastrointestinal: Negative for abdominal pain and blood in stool.  Musculoskeletal: Negative for arthralgias and back pain.  Skin: Negative for rash.  Allergic/Immunologic: Negative for immunocompromised state.  Hematological: Negative for adenopathy. Does not bruise/bleed easily.  Psychiatric/Behavioral: Positive for sleep disturbance. Negative for dysphoric mood. The patient is nervous/anxious.      Objective:  BP (!) 170/106 (BP Location: Left Arm, Patient Position: Sitting, Cuff Size: Small)   Pulse 76   Temp 98.2 F (36.8 C) (Oral)   Ht 5\' 4"  (1.626 m)   Wt 198 lb 9.6 oz (90.1 kg)   LMP 12/04/2009   SpO2 96%  BMI 34.09 kg/m   BP/Weight 09/30/2016 08/12/2016 [id number]  Systolic BP 123XX123 [id number] 0000000  Diastolic BP A999333 93 93  Wt. (Lbs) 198.6 206.8 209.2  BMI 34.09 35.5 35.91   Wt Readings from Last 3 Encounters:  09/30/16 198 lb 9.6 oz (90.1 kg)  08/12/16 206 lb 12.8 oz (93.8 kg)  07/28/16 209 lb 3.2 oz (94.9 kg)   Pulse Readings from Last 3 Encounters:  09/30/16 76  08/12/16 (!) 57  07/28/16 73     Physical Exam  Constitutional: She is oriented to person, place, and time. She appears  well-developed and well-nourished. No distress.  HENT:  Head: Normocephalic and atraumatic.  Right Ear: Tympanic membrane, external ear and ear canal normal.  Left Ear: Tympanic membrane normal.  Cardiovascular: Normal rate, regular rhythm, normal heart sounds and intact distal pulses.   Pulmonary/Chest: Effort normal and breath sounds normal. Right breast exhibits tenderness. Right breast exhibits no inverted nipple, no mass, no nipple discharge and no skin change. Left breast exhibits no inverted nipple, no mass, no nipple discharge, no skin change and no tenderness. Breasts are symmetrical.    Abdominal: Soft. Bowel sounds are normal. She exhibits no distension and no mass. There is tenderness in the right upper quadrant. There is no rebound and no guarding. No hernia. Hernia confirmed negative in the ventral area.  Musculoskeletal: She exhibits no edema.       Arms: Neurological: She is alert and oriented to person, place, and time.  Skin: Skin is warm and dry. No rash noted.  Psychiatric: She has a normal mood and affect.    Depression screen [facility] 2/9 09/30/2016 08/12/2016 07/28/2016  Decreased Interest 0 0 0  Down, Depressed, Hopeless 0 0 0  PHQ - 2 Score 0 0 0  Altered sleeping 3 3 3   Tired, decreased energy 0 0 3  Change in appetite 0 0 0  Feeling bad or failure about yourself  0 3 0  Trouble concentrating 3 3 0  Moving slowly or fidgety/restless 0 0 0  Suicidal thoughts 0 0 0  PHQ-9 Score 6 9 6   Difficult doing work/chores - - -  Some recent data might be hidden   GAD 7 : Generalized Anxiety Score 09/30/2016 08/12/2016 07/28/2016 06/26/2016  Nervous, Anxious, on Edge 3 0 3 3  Control/stop worrying 3 0 0 3  Worry too much - different things 3 0 0 3  Trouble relaxing 3 3 3 3   Restless 3 3 3 3   Easily annoyed or irritable 3 0 3 3  Afraid - awful might happen 3 0 0 3  Total GAD 7 Score 21 6 12 21     Assessment & Plan:  Jennifer [name] was seen today for hypertension.  Diagnoses and  all orders for this visit:  Right upper quadrant abdominal pain -     ondansetron (ZOFRAN-ODT) disintegrating tablet 8 mg; Take 2 tablets (8 mg total) by mouth once. -     acetaminophen (TYLENOL) tablet 1,000 mg; Take 2 tablets (1,000 mg total) by mouth once. -     COMPLETE METABOLIC PANEL WITH GFR -     CBC -     Cancel: US Abdomen Complete; Future -     Lipase -     ondansetron (ZOFRAN-ODT) 8 MG disintegrating tablet; Take 1 tablet (8 mg total) by mouth every 8 (eight) hours as needed for nausea or vomiting. -     pantoprazole (PROTONIX) 40 MG tablet; Take 1 tablet (40 mg total)  by mouth daily before supper. -     NM Hepato W/Eject Fract; Future  Breast pain, right -     MM DIAG BREAST TOMO BILATERAL; Future -     US BREAST LTD UNI RIGHT INC AXILLA; Future -     Cancel: NM HEPATOBILIARY INCLUDING GB; Future  Mastalgia  Essential hypertension, benign   There are no diagnoses linked to this encounter.  No orders of the defined types were placed in this encounter.   Follow-up: Return in about 2 weeks (around 10/14/2016) for BP check .   Boykin Nearing MD

## 2016-09-30 NOTE — Assessment & Plan Note (Signed)
RUQ pain for many months No red flags Treated with zofran and tylenol Repeat labs-CBC, CMP, lipase Check NM hepatobiliary scan with EF to further evaluate gallbladder

## 2016-09-30 NOTE — Progress Notes (Signed)
Pt is here today to follow up on HTN. Pt states that knot under her arm is becoming painful.(under right arm)

## 2016-10-01 ENCOUNTER — Telehealth: Payer: Self-pay

## 2016-10-01 LAB — CBC

## 2016-10-01 LAB — LIPASE: LIPASE: 43 U/L (ref 7–60)

## 2016-10-01 NOTE — Telephone Encounter (Signed)
Pt was called and there is no VM set up to leave a message. 

## 2016-10-07 ENCOUNTER — Ambulatory Visit (HOSPITAL_COMMUNITY): Admission: RE | Admit: 2016-10-07 | Payer: Self-pay | Source: Ambulatory Visit

## 2016-10-08 ENCOUNTER — Telehealth: Payer: Self-pay

## 2016-10-08 NOTE — Telephone Encounter (Signed)
Pt does not have a number on file, pt emergency contact is not available.

## 2016-10-09 ENCOUNTER — Ambulatory Visit: Payer: Self-pay

## 2016-10-15 ENCOUNTER — Ambulatory Visit (HOSPITAL_COMMUNITY): Admission: RE | Admit: 2016-10-15 | Payer: Self-pay | Source: Ambulatory Visit

## 2016-10-27 MED FILL — ?METOPROLOL SUCC ER 50 MG: 50 MG | 30 days supply | Qty: 30 | Fill #3

## 2016-10-27 MED FILL — PARoxetine HCL 20 MG TABS: 20 | 30 days supply | Qty: 30 | Fill #3

## 2016-10-27 MED FILL — LISINOPRIL-HCTZ 20-12.5 MG: 20-12.5 | 30 days supply | Qty: 30 | Fill #2

## 2016-11-05 ENCOUNTER — Other Ambulatory Visit: Payer: Self-pay | Admitting: Obstetrics and Gynecology

## 2016-11-05 DIAGNOSIS — Z1231 Encounter for screening mammogram for malignant neoplasm of breast: Secondary | ICD-10-CM

## 2016-11-06 ENCOUNTER — Ambulatory Visit (HOSPITAL_COMMUNITY): Payer: Self-pay

## 2016-11-24 IMAGING — US US ABDOMEN LIMITED
1 series · 14 of 25 positions shown · non-contrast
Comparison: CT abdomen 12/16/2010, ultrasound abdomen 02/13/2011

CLINICAL DATA: Abdominal pain, right upper quadrant pain for 2
weeks. Nausea, vomiting

EXAM:
US ABDOMEN LIMITED - RIGHT UPPER QUADRANT

[Series 1: us abdomen limited · 0.25mm/px · 14 of 32 slices shown]
[im 1/32]
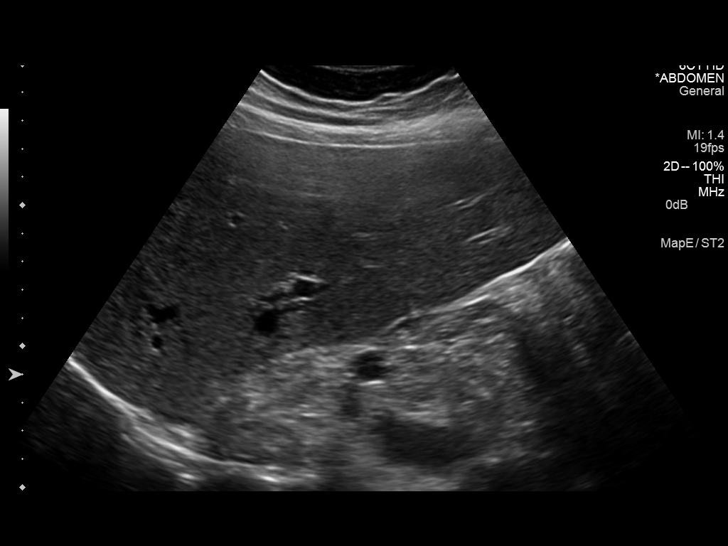
[im 3/32]
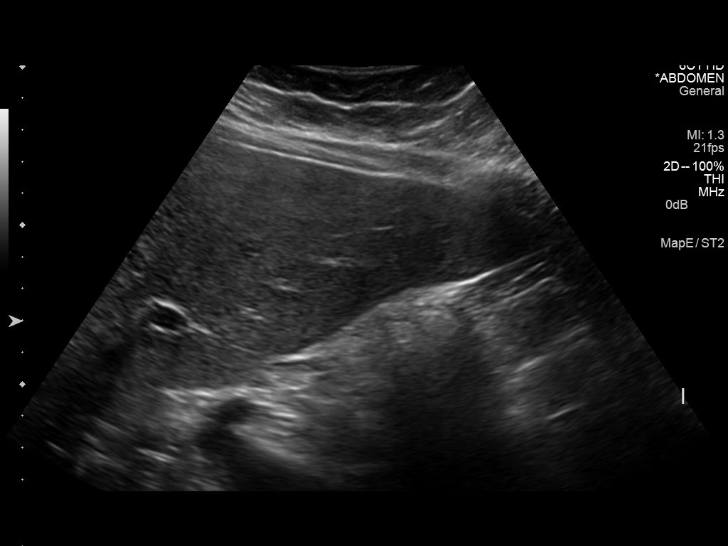
[im 6/32]
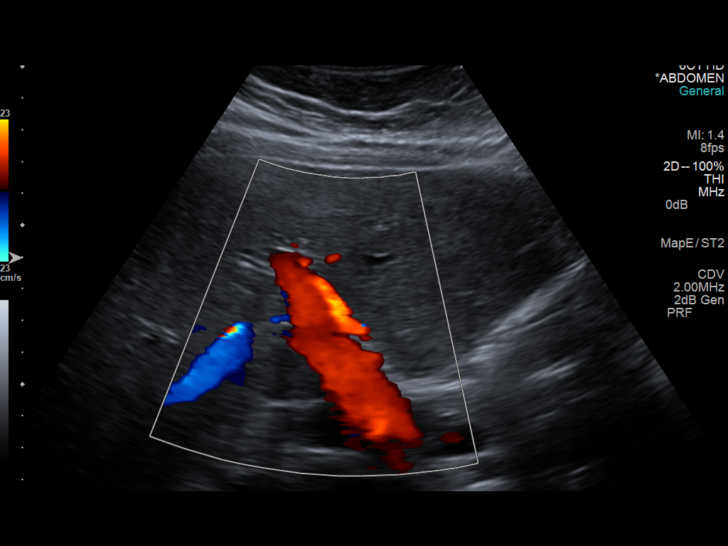
[im 8/32]
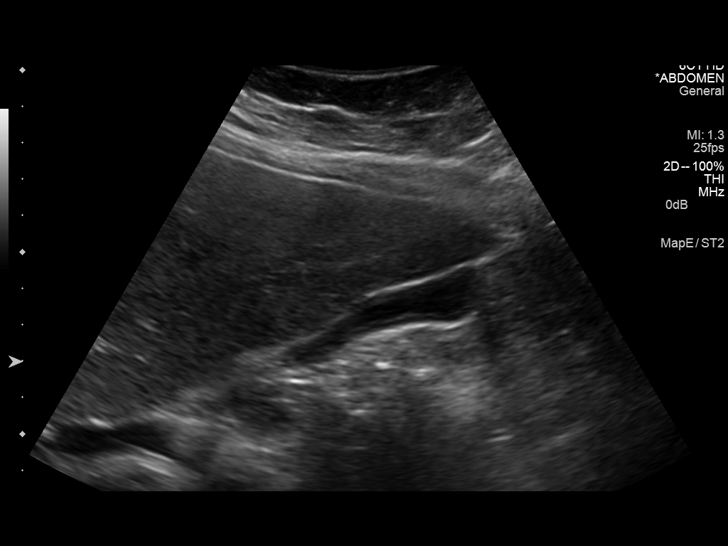
[im 11/32]
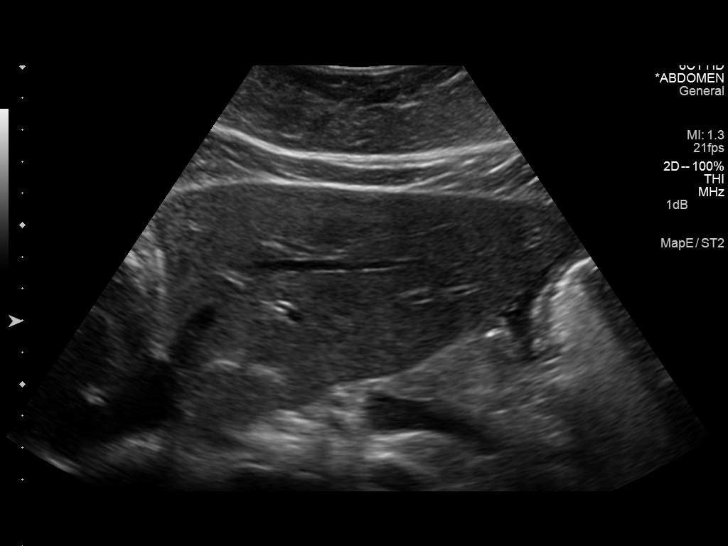
[im 12/32]
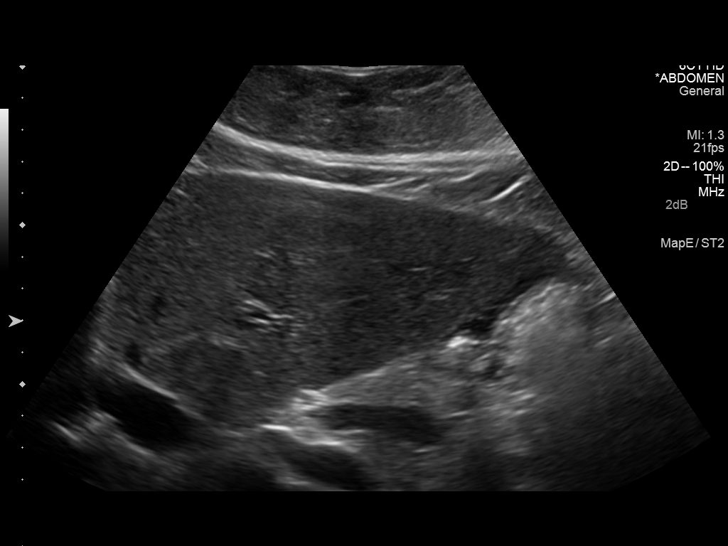
[im 15/32]
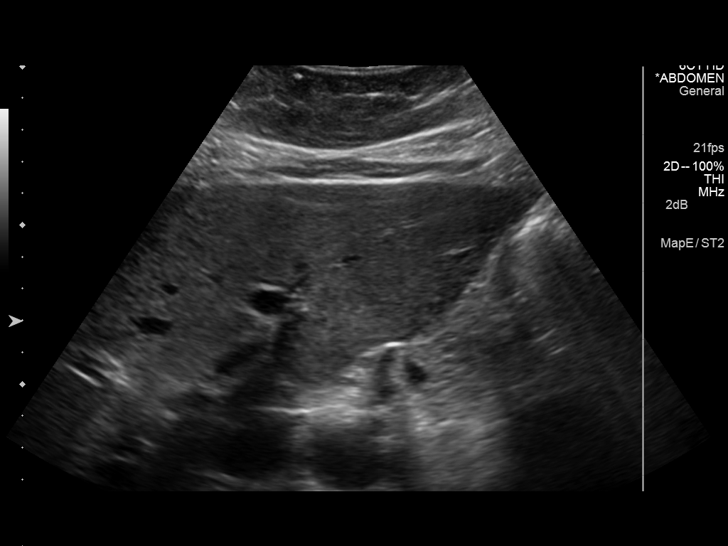
[im 17/32]
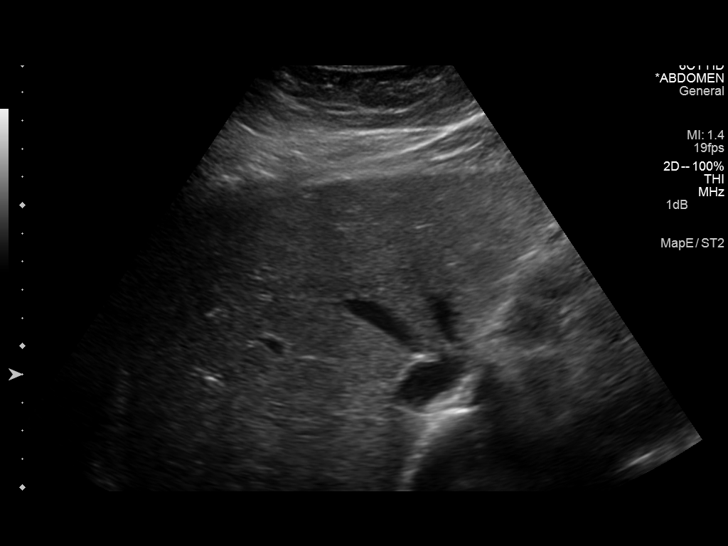
[im 20/32]
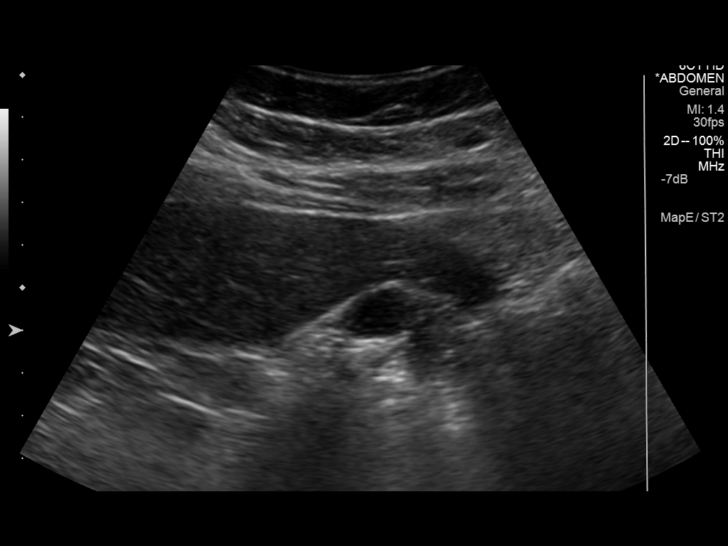
[im 21/32]
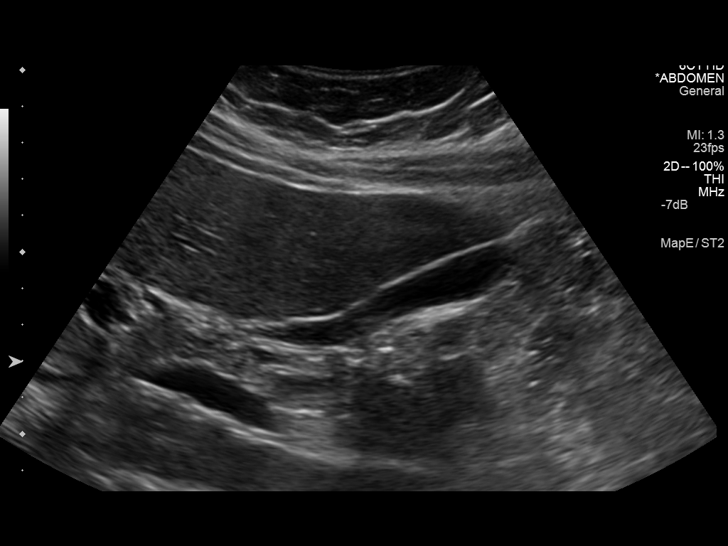
[im 24/32]
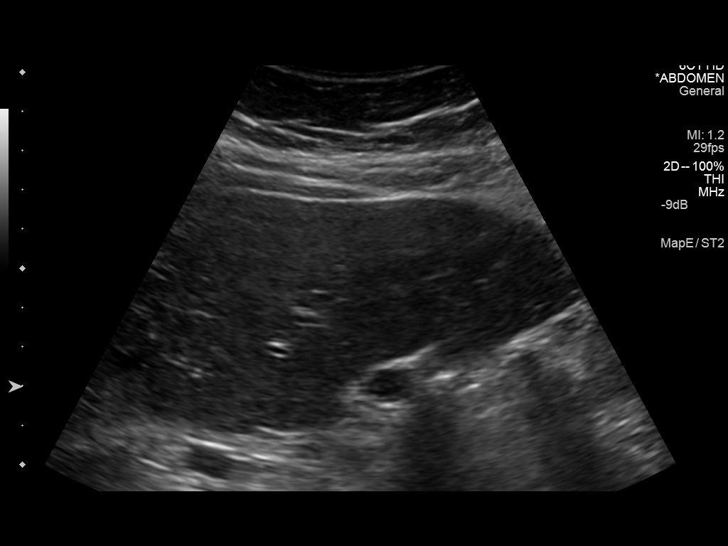
[im 26/32]
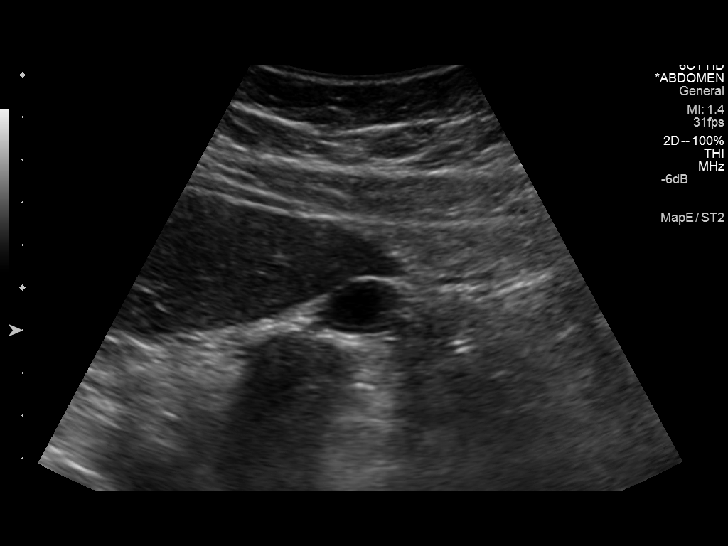
[im 29/32]
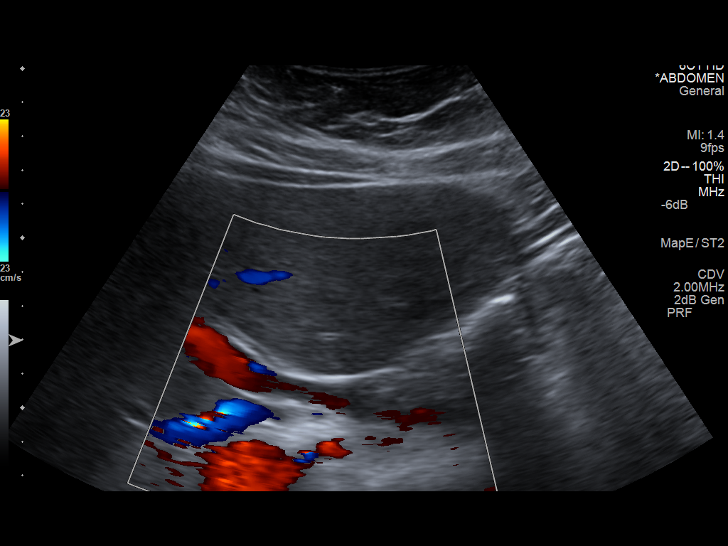
[im 32/32]
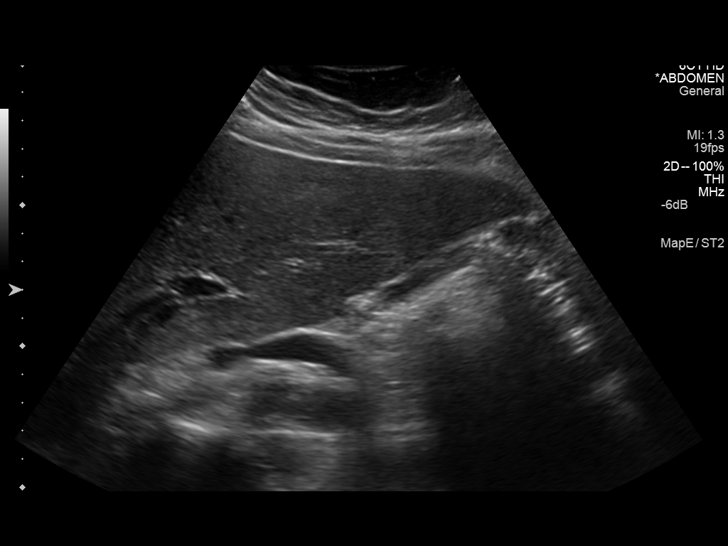

[14 of 25 positions shown; findings below may reference images not displayed]

FINDINGS: Gallbladder:

Contracted gallbladder. No gallstones or wall thickening visualized.
No sonographic Murphy sign noted.

Common bile duct:

Diameter: 3 mm

Liver:

No focal lesion identified. Within normal limits in parenchymal
echogenicity.
IMPRESSION: No sonographic evidence of acute cholecystitis.

## 2016-12-01 MED FILL — LISINOPRIL-HCTZ 20-12.5 MG: 20-12.5 | 30 days supply | Qty: 30 | Fill #3

## 2016-12-01 MED FILL — PARoxetine HCL 20 MG TABS: 20 | 30 days supply | Qty: 30 | Fill #4

## 2016-12-01 MED FILL — ?METOPROLOL SUCC ER 50 MG: 50 MG | 30 days supply | Qty: 30 | Fill #4

## 2016-12-16 ENCOUNTER — Ambulatory Visit: Payer: Self-pay | Admitting: Family Medicine

## 2016-12-24 ENCOUNTER — Encounter: Payer: Self-pay | Admitting: Family Medicine

## 2017-01-12 ENCOUNTER — Other Ambulatory Visit: Payer: Self-pay | Admitting: Family Medicine

## 2017-01-12 ENCOUNTER — Encounter (HOSPITAL_COMMUNITY): Payer: Self-pay | Admitting: *Deleted

## 2017-01-12 ENCOUNTER — Emergency Department (HOSPITAL_COMMUNITY)
Admission: EM | Admit: 2017-01-12 | Discharge: 2017-01-12 | Disposition: A | Discharge status: Home / Self Care | Payer: Self-pay | Attending: Emergency Medicine | Admitting: Emergency Medicine

## 2017-01-12 DIAGNOSIS — K029 Dental caries, unspecified: Secondary | ICD-10-CM | POA: Insufficient documentation

## 2017-01-12 DIAGNOSIS — Z79899 Other long term (current) drug therapy: Secondary | ICD-10-CM | POA: Insufficient documentation

## 2017-01-12 DIAGNOSIS — I10 Essential (primary) hypertension: Secondary | ICD-10-CM | POA: Insufficient documentation

## 2017-01-12 DIAGNOSIS — F4322 Adjustment disorder with anxiety: Secondary | ICD-10-CM

## 2017-01-12 DIAGNOSIS — F17210 Nicotine dependence, cigarettes, uncomplicated: Secondary | ICD-10-CM | POA: Insufficient documentation

## 2017-01-12 MED ORDER — PAROXETINE HCL 20 MG PO TABS
20.0000 mg | ORAL_TABLET | Freq: Every day | ORAL | Status: DC
  Administered 2017-01-12: 20 mg via ORAL
  Filled 2017-01-12: qty 1

## 2017-01-12 MED ORDER — LISINOPRIL-HYDROCHLOROTHIAZIDE 20-12.5 MG PO TABS: 1.0000 | ORAL_TABLET | Freq: Every day | ORAL | Status: DC

## 2017-01-12 MED ORDER — METOPROLOL SUCCINATE ER 50 MG PO TB24
50.0000 mg | ORAL_TABLET | Freq: Every day | ORAL | Status: DC
  Administered 2017-01-12: 50 mg via ORAL
  Filled 2017-01-12: qty 1

## 2017-01-12 MED ORDER — PENICILLIN V POTASSIUM 500 MG PO TABS: 500.0000 mg | ORAL_TABLET | Freq: Three times a day (TID) | ORAL | 0 refills | Status: DC

## 2017-01-12 MED ORDER — ACETAMINOPHEN 500 MG PO TABS
1000.0000 mg | ORAL_TABLET | Freq: Once | ORAL | Status: AC
  Administered 2017-01-12: 1000 mg via ORAL
  Filled 2017-01-12: qty 2

## 2017-01-12 MED ORDER — HYDROCHLOROTHIAZIDE 12.5 MG PO CAPS
12.5000 mg | ORAL_CAPSULE | Freq: Every day | ORAL | Status: DC
  Filled 2017-01-12: qty 1

## 2017-01-12 MED ORDER — LISINOPRIL 20 MG PO TABS
20.0000 mg | ORAL_TABLET | Freq: Every day | ORAL | Status: DC
  Filled 2017-01-12: qty 1

## 2017-01-12 MED FILL — PENICILLIN VK 500 MG TABLET: 500 | 10 days supply | Qty: 30 | Fill #0

## 2017-01-12 MED FILL — ?METOPROLOL SUCC ER 50 MG: 50 MG | 30 days supply | Qty: 30 | Fill #5

## 2017-01-12 MED FILL — LISINOPRIL-HCTZ 20-12.5 MG: 20-12.5 | 30 days supply | Qty: 30 | Fill #4

## 2017-01-12 MED FILL — PARoxetine HCL 20 MG TABS: 20 | 30 days supply | Qty: 30 | Fill #5

## 2017-01-12 NOTE — ED Notes (Signed)
Patient c/o loose bottom right tooth she wants pulled and states she has infection in her mouth , states she has been rinsing her mouth with cholox and water. Instructed patient not to put cholox in her mouth . States her blood pressure is up hasn't taken her medicine in 1 week. Hasn't had the money , [facility] be [facility]  Get her medicine on Wed.

## 2017-01-12 NOTE — ED Provider Notes (Signed)
[facility] DEPT Provider Note   CSN: [id number] Arrival date & time: 01/12/17  [id number]     History   Chief Complaint Chief Complaint  Patient presents with  . Hypertension  . Dental Pain    HPI Jennifer [name] is a 56 y.o. female.  HPI Patient presents to the emergency room for evaluation of high blood pressure and toothache. Patient states she has a history of hypertension. She ran out of her blood pressure medications for couple days ago.  She says she has a prescription waiting for her at her clinic but it will not be available for a couple of days. She came into the emergency room because her blood pressure is high and she has a bad headache and wants to make sure her blood pressure gets lowered.  Patient also is complaining of a toothache. Patient states she's been told she needs to have oral surgery.  She cannot afford that. She continues to have pain in the right lower molar region.  Patient also states she feels like her nerves are bothering her and she would like her Prozac medication. Past Medical History:  Diagnosis Date  . bipolar   . Depression   . Fibroids    now s/p hysterectomy  . Hypertension   . Left ventricular hypertrophy   . Polysubstance abuse    IV drug [place], cocaine on occassion, smoking and alcoholism, has been going to AA, not completely sober    Patient Active Problem List   Diagnosis Date Noted  . Current smoker 08/12/2016  . Homeless single person 08/12/2016  . Skin lesion of left leg 03/29/2015  . Trigger middle finger of right hand 01/11/2015  . Discoloration of eye 01/11/2015  . Bradycardia 01/11/2015  . Skin lesion of scalp 07/17/2014  . Leg swelling 05/20/2013  . Vasomotor rhinitis 08/17/2012  . Right leg pain 07/26/2012  . Anxiety 02/26/2012  . GERD (gastroesophageal reflux disease) 08/08/2011  . Menopausal symptoms 07/23/2011  . Mastalgia 07/23/2011  . Bipolar disorder, current episode manic without psychotic features, moderate  ([place]) 12/05/2010  . 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 SEBACEOUS CYST, SCALP 05/06/2010  . SYNOVIAL CYST 05/03/2010  . Abdominal pain 12/06/2009  . DRUG ABUSE, HX OF 12/06/2009  . Lipoma of shoulder 02/06/2009  . FIBROIDS, UTERUS 02/06/2009  . DRUG ABUSE 02/06/2009  . Essential hypertension, benign 02/06/2009  . PAIN IN JOINT, SHOULDER REGION 02/06/2009  . Insomnia 02/06/2009  . PELVIC MASS 02/06/2009    Past Surgical History:  Procedure Laterality Date  . ABDOMINAL HYSTERECTOMY     2003  . BILATERAL SALPINGOOPHORECTOMY     01/2010  . DILATION AND CURETTAGE OF UTERUS    . EXPLORATORY LAPAROTOMY     complex pelvic mass 2011  . TONSILLECTOMY      OB History    Gravida Para Term Preterm AB Living   5 0 0 0 4 0   SAB TAB Ectopic Multiple Live Births   1 0 0 0         Home Medications    Prior to Admission medications   Medication Sig Start Date End Date Taking? Authorizing Provider  acetaminophen (TYLENOL) 325 MG tablet Take 2 tablets (650 mg total) by mouth every 6 (six) hours as needed for mild pain or moderate pain. Patient not taking: Reported on 09/30/2016 07/02/16   Waynetta Pean, PA-C  lisinopril-hydrochlorothiazide (ZESTORETIC) 20-12.5 MG tablet Take 1 tablet by mouth daily. 06/30/16   Argentina Donovan, PA-C  metoprolol succinate (  TOPROL-XL) 50 MG 24 hr tablet Take 1 tablet (50 mg total) by mouth daily. Take with or immediately following a meal. 07/28/16   Funches, Adriana Mccallum, MD  ondansetron (ZOFRAN-ODT) 8 MG disintegrating tablet Take 1 tablet (8 mg total) by mouth every 8 (eight) hours as needed for nausea or vomiting. 09/30/16   Boykin Nearing, MD  pantoprazole (PROTONIX) 40 MG tablet Take 1 tablet (40 mg total) by mouth daily before supper. 09/30/16   Funches, Adriana Mccallum, MD  PARoxetine (PAXIL) 20 MG tablet Take 1 tablet (20 mg total) by mouth daily. 07/28/16   Funches, Adriana Mccallum, MD  penicillin v potassium (VEETID) 500 MG tablet Take 1 tablet (500 mg total) by mouth 3  (three) times daily. 01/12/17   Dorie Rank, MD    Family History Family History  Problem Relation Age of Onset  . Hypertension Mother   . Hypertension Sister   . Diabetes Father     Social History Social History  Substance Use Topics  . Smoking status: Current Every Day Smoker    Packs/day: 0.50    Years: 16.00    Types: Cigarettes  . Smokeless tobacco: Never Used     Comment: Wants to quit.   . Alcohol use No     Allergies   Ibuprofen   Review of Systems Review of Systems  All other systems reviewed and are negative.    Physical Exam Updated Vital Signs BP (!) 145/102 (BP Location: Left Arm)   Pulse 67   Temp 99 F (37.2 C) (Oral)   Resp 18   LMP 12/04/2009   SpO2 96%   Physical Exam  Constitutional: She appears well-developed and well-nourished. No distress.  HENT:  Head: Normocephalic and atraumatic.  Right Ear: External ear normal.  Left Ear: External ear normal.  Dental caries with fractured tooth and dental decay in the right lower molar region, no submandibular swelling, no purulent drainage  Eyes: Conjunctivae are normal. Right eye exhibits no discharge. Left eye exhibits no discharge. No scleral icterus.  Neck: Neck supple. No tracheal deviation present.  Cardiovascular: Normal rate, regular rhythm and intact distal pulses.   Pulmonary/Chest: Effort normal and breath sounds normal. No stridor. No respiratory distress. She has no wheezes. She has no rales.  Abdominal: Soft. Bowel sounds are normal. She exhibits no distension. There is no tenderness. There is no rebound and no guarding.  Musculoskeletal: She exhibits no edema or tenderness.  Neurological: She is alert. She has normal strength. No cranial nerve deficit (no facial droop, extraocular movements intact, no slurred speech) or sensory deficit. She exhibits normal muscle tone. She displays no seizure activity. Coordination normal.  Skin: Skin is warm and dry. No rash noted.  Psychiatric: She  has a normal mood and affect.  Nursing note and vitals reviewed.    ED Treatments / Results   Procedures Procedures (including critical care time)  Medications Ordered in ED Medications  acetaminophen (TYLENOL) tablet 1,000 mg (not administered)  lisinopril-hydrochlorothiazide (PRINZIDE,ZESTORETIC) 20-12.5 MG per tablet 1 tablet (not administered)  metoprolol succinate (TOPROL-XL) 24 hr tablet 50 mg (not administered)  PARoxetine (PAXIL) tablet 20 mg (not administered)     Initial Impression / Assessment and Plan / ED Course  I have reviewed the triage vital signs and the nursing notes.  Pertinent labs & imaging results that were available during my care of the patient were reviewed by me and considered in my medical decision making (see chart for details).   the patient presented to the  emergency room with complaints of toothache. She is also complaining of needing her medications refilled. The patient has mild hypertension here. I will give her a dose of her blood pressure medications. She plans on getting her refills on Wednesday. I will give the patient a prescription for penicillin to help with possible dental infection. I provided the names of outpatient dental resources.  At this time there does not appear to be any evidence of an acute emergency medical condition, no evidence of dental abscess, no evidence of hypertensive emergency and the patient appears stable for discharge with appropriate outpatient follow up.   Final Clinical Impressions(s) / ED Diagnoses   Final diagnoses:  Hypertension, unspecified type  Pain due to dental caries    New Prescriptions New Prescriptions   PENICILLIN V POTASSIUM (VEETID) 500 MG TABLET    Take 1 tablet (500 mg total) by mouth 3 (three) times daily.     Dorie Rank, MD 01/12/17 1020

## 2017-01-12 NOTE — ED Triage Notes (Signed)
Pt reports being off her bp meds for over the past week, has headache and feels like bp is high. Also requests antibiotic for dental infection. No acute distress is noted at triage.

## 2017-02-16 ENCOUNTER — Other Ambulatory Visit: Payer: Self-pay | Admitting: Physician Assistant

## 2017-02-16 ENCOUNTER — Other Ambulatory Visit: Payer: Self-pay | Admitting: Family Medicine

## 2017-02-16 DIAGNOSIS — I10 Essential (primary) hypertension: Secondary | ICD-10-CM

## 2017-02-16 DIAGNOSIS — F4322 Adjustment disorder with anxiety: Secondary | ICD-10-CM

## 2017-02-16 MED FILL — PARoxetine HCL 40 MG TABS: 40 | 30 days supply | Qty: 30 | Fill #0

## 2017-02-16 MED FILL — LISINOPRIL-HCTZ 20-12.5 MG: 20-12.5 | 30 days supply | Qty: 30 | Fill #5

## 2017-02-16 MED FILL — ?METOPROLOL SUCC ER 50 MG: 50 MG | 30 days supply | Qty: 30 | Fill #6

## 2017-02-27 ENCOUNTER — Ambulatory Visit: Payer: Self-pay | Admitting: Family Medicine

## 2017-03-19 ENCOUNTER — Other Ambulatory Visit: Payer: Self-pay | Admitting: Physician Assistant

## 2017-03-19 DIAGNOSIS — I10 Essential (primary) hypertension: Secondary | ICD-10-CM

## 2017-03-19 MED FILL — PARoxetine HCL 40 MG TABS: 40 | 30 days supply | Qty: 30 | Fill #1

## 2017-03-19 MED FILL — LISINOPRIL-HCTZ 20-12.5 MG: 20-12.5 | 30 days supply | Qty: 30 | Fill #0

## 2017-03-19 MED FILL — METOPROLOL SUCC ER 50 MG TA: 50 | 30 days supply | Qty: 30 | Fill #7

## 2017-04-14 ENCOUNTER — Other Ambulatory Visit: Payer: Self-pay | Admitting: Family Medicine

## 2017-04-14 DIAGNOSIS — I10 Essential (primary) hypertension: Secondary | ICD-10-CM

## 2017-04-14 DIAGNOSIS — F4322 Adjustment disorder with anxiety: Secondary | ICD-10-CM

## 2017-04-14 MED FILL — PARoxetine HCL 40 MG TABS: 40 | 30 days supply | Qty: 30 | Fill #2

## 2017-04-14 MED FILL — METOPROLOL SUCC ER 50 MG TA: 50 | 30 days supply | Qty: 30 | Fill #8

## 2017-04-20 ENCOUNTER — Other Ambulatory Visit: Payer: Self-pay | Admitting: Family Medicine

## 2017-04-20 DIAGNOSIS — I10 Essential (primary) hypertension: Secondary | ICD-10-CM

## 2017-04-20 MED FILL — LISINOPRIL-HCTZ 20-12.5 MG: 20-12.5 | 30 days supply | Qty: 30 | Fill #0

## 2017-04-20 NOTE — Telephone Encounter (Signed)
Pt. Came to facility requesting a refill on lisinopril-hydrochlorothiazide (PRINZIDE,ZESTORETIC) 20-12.5 MG tablet  Pt. Would like Rx sent to [facility] pharmacy.  Pt. Has scheduled an appt. For 05/15/17. Please f/u

## 2017-04-27 ENCOUNTER — Encounter (HOSPITAL_COMMUNITY): Payer: Self-pay | Admitting: *Deleted

## 2017-04-27 ENCOUNTER — Emergency Department (HOSPITAL_COMMUNITY): Payer: Self-pay

## 2017-04-27 ENCOUNTER — Emergency Department (HOSPITAL_COMMUNITY)
Admission: EM | Admit: 2017-04-27 | Discharge: 2017-04-27 | Disposition: A | Discharge status: Home / Self Care | Payer: Self-pay | Attending: Emergency Medicine | Admitting: Emergency Medicine

## 2017-04-27 DIAGNOSIS — L0291 Cutaneous abscess, unspecified: Secondary | ICD-10-CM

## 2017-04-27 DIAGNOSIS — L02411 Cutaneous abscess of right axilla: Secondary | ICD-10-CM | POA: Insufficient documentation

## 2017-04-27 DIAGNOSIS — Z79899 Other long term (current) drug therapy: Secondary | ICD-10-CM | POA: Insufficient documentation

## 2017-04-27 DIAGNOSIS — I10 Essential (primary) hypertension: Secondary | ICD-10-CM | POA: Insufficient documentation

## 2017-04-27 DIAGNOSIS — F17210 Nicotine dependence, cigarettes, uncomplicated: Secondary | ICD-10-CM | POA: Insufficient documentation

## 2017-04-27 MED ORDER — HYDROCHLOROTHIAZIDE 12.5 MG PO CAPS
12.5000 mg | ORAL_CAPSULE | Freq: Every day | ORAL | Status: DC
  Administered 2017-04-27: 12.5 mg via ORAL
  Filled 2017-04-27: qty 1

## 2017-04-27 MED ORDER — LISINOPRIL 20 MG PO TABS
20.0000 mg | ORAL_TABLET | Freq: Every day | ORAL | Status: DC
  Administered 2017-04-27: 20 mg via ORAL
  Filled 2017-04-27: qty 1

## 2017-04-27 NOTE — ED Notes (Signed)
Pt states she doesn't want d/c papers and she doesn't want me to recheck her bp states she is going to [facility] to have that checked,  Pt given crackers and ginger ale

## 2017-04-27 NOTE — ED Notes (Signed)
Pt  States that she  Has felt a  Bump under rt armpit x 1 year and it comes and goes sometimes she feels it and sometimes she cannot, she also  Has run out of her bp meds

## 2017-04-27 NOTE — ED Triage Notes (Addendum)
To ED for eval of 'knot' in right axilla area. States she feels like the area is deep and she's unable to pinpoint it. States she feels as if it moves. First noticed a year ago

## 2017-04-27 NOTE — ED Provider Notes (Signed)
[facility] DEPT Provider Note   CSN: [id number] Arrival date & time: 04/27/17  0857     History   Chief Complaint Chief Complaint  Patient presents with  . Abscess    HPI Jennifer [name] is a 56 y.o. female.  HPI  Patient presents to ED for evaluation of not under right armpit for the past year. She states that "sometimes I can't even feel it, it just comes and goes." States she can mostly feel it when she puts her arm down by her side. She does not currently feel the not here in the ED. States that her last mammogram was over one year ago. She denies any fever, chills, drainage from site, injury or bite.  Past Medical History:  Diagnosis Date  . bipolar   . Depression   . Fibroids    now s/p hysterectomy  . Hypertension   . Left ventricular hypertrophy   . Polysubstance abuse    IV drug [place], cocaine on occassion, smoking and alcoholism, has been going to AA, not completely sober    Patient Active Problem List   Diagnosis Date Noted  . Current smoker 08/12/2016  . Homeless single person 08/12/2016  . Skin lesion of left leg 03/29/2015  . Trigger middle finger of right hand 01/11/2015  . Discoloration of eye 01/11/2015  . Bradycardia 01/11/2015  . Skin lesion of scalp 07/17/2014  . Leg swelling 05/20/2013  . Vasomotor rhinitis 08/17/2012  . Right leg pain 07/26/2012  . Anxiety 02/26/2012  . GERD (gastroesophageal reflux disease) 08/08/2011  . Menopausal symptoms 07/23/2011  . Mastalgia 07/23/2011  . Bipolar disorder, current episode manic without psychotic features, moderate ([place]) 12/05/2010  . 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 SEBACEOUS CYST, SCALP 05/06/2010  . SYNOVIAL CYST 05/03/2010  . Abdominal pain 12/06/2009  . DRUG ABUSE, HX OF 12/06/2009  . Lipoma of shoulder 02/06/2009  . FIBROIDS, UTERUS 02/06/2009  . DRUG ABUSE 02/06/2009  . Essential hypertension, benign 02/06/2009  . PAIN IN JOINT, SHOULDER REGION 02/06/2009  . Insomnia 02/06/2009  .  PELVIC MASS 02/06/2009    Past Surgical History:  Procedure Laterality Date  . ABDOMINAL HYSTERECTOMY     2003  . BILATERAL SALPINGOOPHORECTOMY     01/2010  . DILATION AND CURETTAGE OF UTERUS    . EXPLORATORY LAPAROTOMY     complex pelvic mass 2011  . TONSILLECTOMY      OB History    Gravida Para Term Preterm AB Living   5 0 0 0 4 0   SAB TAB Ectopic Multiple Live Births   1 0 0 0         Home Medications    Prior to Admission medications   Medication Sig Start Date End Date Taking? Authorizing Provider  lisinopril-hydrochlorothiazide (PRINZIDE,ZESTORETIC) 20-12.5 MG tablet TAKE 1 TABLET BY MOUTH ONCE DAILY. 04/20/17  Yes Tresa Garter, MD  metoprolol succinate (TOPROL-XL) 50 MG 24 hr tablet Take 1 tablet (50 mg total) by mouth daily. Take with or immediately following a meal. 07/28/16  Yes Funches, Josalyn, MD  PARoxetine (PAXIL) 40 MG tablet Take 1 tablet (40 mg total) by mouth daily. 02/16/17  Yes Funches, Adriana Mccallum, MD  acetaminophen (TYLENOL) 325 MG tablet Take 2 tablets (650 mg total) by mouth every 6 (six) hours as needed for mild pain or moderate pain. Patient not taking: Reported on 09/30/2016 07/02/16   Waynetta Pean, PA-C  ondansetron (ZOFRAN-ODT) 8 MG disintegrating tablet Take 1 tablet (8 mg total) by mouth every  8 (eight) hours as needed for nausea or vomiting. Patient not taking: Reported on 04/27/2017 09/30/16   Boykin Nearing, MD  pantoprazole (PROTONIX) 40 MG tablet Take 1 tablet (40 mg total) by mouth daily before supper. Patient not taking: Reported on 04/27/2017 09/30/16   Boykin Nearing, MD    Family History Family History  Problem Relation Age of Onset  . Hypertension Mother   . Hypertension Sister   . Diabetes Father     Social History Social History  Substance Use Topics  . Smoking status: Current Every Day Smoker    Packs/day: 0.50    Years: 16.00    Types: Cigarettes  . Smokeless tobacco: Never Used     Comment: Wants to quit.   .  Alcohol use No     Allergies   Ibuprofen   Review of Systems Review of Systems  Constitutional: Negative for chills and fever.  Respiratory: Negative for shortness of breath.   Gastrointestinal: Negative for nausea and vomiting.  Musculoskeletal: Negative for arthralgias, joint swelling and myalgias.  Skin: Negative for color change, pallor, rash and wound.       Knot under right armpit     Physical Exam Updated Vital Signs BP (!) 160/106 (BP Location: Left Arm)   Pulse (!) 55   Temp 98.1 F (36.7 C) (Oral)   Resp 16   LMP 12/04/2009   SpO2 96%   Physical Exam  Constitutional: She appears well-developed and well-nourished. No distress.  HENT:  Head: Normocephalic and atraumatic.  Eyes: Conjunctivae and EOM are normal. No scleral icterus.  Neck: Normal range of motion.  Pulmonary/Chest: Effort normal. No respiratory distress.  Neurological: She is alert.  Skin: Skin is warm and dry. No rash noted. She is not diaphoretic. No erythema. No pallor.  No palpable abscess or visible abscess noted under right armpit.  Psychiatric: She has a normal mood and affect.  Nursing note and vitals reviewed.    ED Treatments / Results  Labs (all labs ordered are listed, but only abnormal results are displayed) Labs Reviewed - No data to display  EKG  EKG Interpretation None       Radiology Dg Shoulder Right  Result Date: 04/27/2017 CLINICAL DATA:  Right arm pain. EXAM: RIGHT SHOULDER - 2+ VIEW COMPARISON:  None. FINDINGS: There is no evidence of fracture or dislocation. There is no evidence of arthropathy or other focal bone abnormality. Soft tissues are unremarkable. IMPRESSION: No acute osseous injury of the right shoulder. Electronically Signed   By: Kathreen Devoid   On: 04/27/2017 11:42    Procedures Procedures (including critical care time)  Medications Ordered in ED Medications  lisinopril (PRINIVIL,ZESTRIL) tablet 20 mg (20 mg Oral Given 04/27/17 1101)    And    hydrochlorothiazide (MICROZIDE) capsule 12.5 mg (12.5 mg Oral Given 04/27/17 1102)     Initial Impression / Assessment and Plan / ED Course  I have reviewed the triage vital signs and the nursing notes.  Pertinent labs & imaging results that were available during my care of the patient were reviewed by me and considered in my medical decision making (see chart for details).     Patient presents to ED for evaluation of not under right armpit for the past year. States that the knot comes and goes on its own. She does not currently feel the not here in the ED. On my physical exam there is no palpable abscess or mass noted. She states that her last mammogram was  over a year ago. She does want x-ray to make sure that there is nothing there. Patient is hypertensive here in the ED states that she has not taken her blood pressure medications today. X-ray of this area returned as negative. I informed patient that she should follow up with her PCP and schedule mammogram as soon as possible for further evaluation. Patient given 1 dose of blood pressure medication here in the ED. She left before blood pressure could be rechecked or discharge paperwork could be given. Strict return precautions given.  Final Clinical Impressions(s) / ED Diagnoses   Final diagnoses:  Abscess    New Prescriptions New Prescriptions   No medications on file     Delia Heady, Hershal Coria 04/27/17 1152    Dorie Rank, MD 05/01/17 [phone]

## 2017-05-11 ENCOUNTER — Other Ambulatory Visit: Payer: Self-pay | Admitting: Family Medicine

## 2017-05-11 DIAGNOSIS — F4322 Adjustment disorder with anxiety: Secondary | ICD-10-CM

## 2017-05-11 MED FILL — METOPROLOL SUCC ER 50 MG TA: 50 | 30 days supply | Qty: 30 | Fill #9

## 2017-05-11 MED FILL — PARoxetine HCL 40 MG TABS: 40 | 30 days supply | Qty: 30 | Fill #0

## 2017-05-15 ENCOUNTER — Encounter: Payer: Self-pay | Admitting: Internal Medicine

## 2017-05-15 ENCOUNTER — Ambulatory Visit: Payer: Self-pay | Attending: Internal Medicine | Admitting: Internal Medicine

## 2017-05-15 VITALS — BP 153/106 | HR 73 | Temp 99.1°F | Resp 16 | Wt 181.0 lb

## 2017-05-15 DIAGNOSIS — F3112 Bipolar disorder, current episode manic without psychotic features, moderate: Secondary | ICD-10-CM | POA: Insufficient documentation

## 2017-05-15 DIAGNOSIS — R059 Cough, unspecified: Secondary | ICD-10-CM

## 2017-05-15 DIAGNOSIS — F17200 Nicotine dependence, unspecified, uncomplicated: Secondary | ICD-10-CM

## 2017-05-15 DIAGNOSIS — R05 Cough: Secondary | ICD-10-CM | POA: Insufficient documentation

## 2017-05-15 DIAGNOSIS — K219 Gastro-esophageal reflux disease without esophagitis: Secondary | ICD-10-CM | POA: Insufficient documentation

## 2017-05-15 DIAGNOSIS — I10 Essential (primary) hypertension: Secondary | ICD-10-CM | POA: Insufficient documentation

## 2017-05-15 DIAGNOSIS — F419 Anxiety disorder, unspecified: Secondary | ICD-10-CM | POA: Insufficient documentation

## 2017-05-15 DIAGNOSIS — Z79899 Other long term (current) drug therapy: Secondary | ICD-10-CM | POA: Insufficient documentation

## 2017-05-15 DIAGNOSIS — F17210 Nicotine dependence, cigarettes, uncomplicated: Secondary | ICD-10-CM | POA: Insufficient documentation

## 2017-05-15 MED ORDER — CLONIDINE HCL 0.1 MG PO TABS
0.1000 mg | ORAL_TABLET | Freq: Once | ORAL | Status: AC
  Administered 2017-05-15: 0.1 mg via ORAL

## 2017-05-15 MED ORDER — METOPROLOL SUCCINATE ER 50 MG PO TB24: 50.0000 mg | ORAL_TABLET | Freq: Every day | ORAL | 3 refills | Status: DC

## 2017-05-15 MED ORDER — AMLODIPINE BESYLATE 5 MG PO TABS: 5.0000 mg | ORAL_TABLET | Freq: Every day | ORAL | 3 refills | Status: DC

## 2017-05-15 MED ORDER — LISINOPRIL-HYDROCHLOROTHIAZIDE 20-12.5 MG PO TABS: 1.0000 | ORAL_TABLET | Freq: Every day | ORAL | 6 refills | Status: DC

## 2017-05-15 MED ORDER — BENZONATATE 100 MG PO CAPS: 100.0000 mg | ORAL_CAPSULE | Freq: Two times a day (BID) | ORAL | 0 refills | Status: DC | PRN

## 2017-05-15 MED FILL — AMLODIPINE BESYLATE 5 MG TA: 5 | 30 days supply | Qty: 30 | Fill #0

## 2017-05-15 MED FILL — BENZONATATE 100 MG CAPSULE: 100 | 10 days supply | Qty: 20 | Fill #0

## 2017-05-15 MED FILL — LISINOPRIL-HCTZ 20-12.5 MG: 20-12.5 | 30 days supply | Qty: 30 | Fill #0

## 2017-05-15 NOTE — Progress Notes (Signed)
Pt states the bp medicine she is taking now is not working Pt is requesting a new bp medicine Pt states she was doubling up on her bp medicine to help bring her bp down

## 2017-05-15 NOTE — Patient Instructions (Addendum)
Please call Jennifer [name]  [phone], with the [facility] (breast and cervical cancer control program) to schedule diagnostic mammogram   Continue taking Lisinopril/HCTZ and Metoprolol. Add Norvasc 5 mg daily.

## 2017-05-15 NOTE — Progress Notes (Signed)
Patient ID: [name], female    DOB: 09-05-1960  MRN: [id number]  CC: re-establish; Hypertension; and Cough   Subjective: Jennifer [name] is a 56 y.o. female who presents for chronic ds management Her concerns today include:  Pt with hx of HTN, tob dep, bipolar ds, anxiety, drug abuse, GERD  1. HTN: Patient very agitated when I entered the room. She is concerned that her blood pressure is high and wanting a point-of-care medication now to help bring it down -Reports that metoprolol and lisinopril/HCTZ are not working. Some days she has to take 2 of the lisinopril/HCTZ. -Denies any chest pains or shortness of breath at this time. Does report slight headache  2. Complains of dry cough for a while that has been worse over the past week. -No shortness of breath or fever. No runny nose or nasal congestion.  She wants an exam "deep into her lungs." -continues to smoke. Not interested in quitting.   Patient Active Problem List   Diagnosis Date Noted  . Current smoker 08/12/2016  . Homeless single person 08/12/2016  . Skin lesion of left leg 03/29/2015  . Trigger middle finger of right hand 01/11/2015  . Discoloration of eye 01/11/2015  . Bradycardia 01/11/2015  . Skin lesion of scalp 07/17/2014  . Leg swelling 05/20/2013  . Vasomotor rhinitis 08/17/2012  . Right leg pain 07/26/2012  . Anxiety 02/26/2012  . GERD (gastroesophageal reflux disease) 08/08/2011  . Menopausal symptoms 07/23/2011  . Mastalgia 07/23/2011  . Bipolar disorder, current episode manic without psychotic features, moderate ([place]) 12/05/2010  . 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 SEBACEOUS CYST, SCALP 05/06/2010  . SYNOVIAL CYST 05/03/2010  . Abdominal pain 12/06/2009  . DRUG ABUSE, HX OF 12/06/2009  . Lipoma of shoulder 02/06/2009  . FIBROIDS, UTERUS 02/06/2009  . DRUG ABUSE 02/06/2009  . Essential hypertension, benign 02/06/2009  . PAIN IN JOINT, SHOULDER REGION 02/06/2009  . Insomnia 02/06/2009  .  PELVIC MASS 02/06/2009     Current Outpatient Prescriptions on File Prior to Visit  Medication Sig Dispense Refill  . pantoprazole (PROTONIX) 40 MG tablet Take 1 tablet (40 mg total) by mouth daily before supper. (Patient not taking: Reported on 04/27/2017) 30 tablet 3  . PARoxetine (PAXIL) 40 MG tablet TAKE 1 TABLET (40 MG TOTAL) BY MOUTH DAILY. 30 tablet 0   No current facility-administered medications on file prior to visit.     Allergies  Allergen Reactions  . Ibuprofen Hives, Nausea And Vomiting, Swelling and Other (See Comments)    REACTION: hives, swelling, breathing issues    Social History   Social History  . Marital status: Single    Spouse name: N/A  . Number of children: N/A  . Years of education: N/A   Occupational History  . Not on file.   Social History Main Topics  . Smoking status: Current Every Day Smoker    Packs/day: 0.50    Years: 16.00    Types: Cigarettes  . Smokeless tobacco: Never Used     Comment: Wants to quit.   . Alcohol use No  . Drug use: No  . Sexual activity: Not on file   Other Topics Concern  . Not on file   Social History Narrative   Financial assistance approved for 100% discount at [facility] and has [facility] card per Bonna Gains   07/19/2010          Family History  Problem Relation Age of Onset  . Hypertension Mother   .  Hypertension Sister   . Diabetes Father     Past Surgical History:  Procedure Laterality Date  . ABDOMINAL HYSTERECTOMY     2003  . BILATERAL SALPINGOOPHORECTOMY     01/2010  . DILATION AND CURETTAGE OF UTERUS    . EXPLORATORY LAPAROTOMY     complex pelvic mass 2011  . TONSILLECTOMY      ROS: Review of Systems  PHYSICAL EXAM: BP (!) 153/106   Pulse 73   Temp 99.1 F (37.3 C) (Oral)   Resp 16   Wt 181 lb (82.1 kg)   LMP 12/04/2009   SpO2 97%   BMI 31.07 kg/m   Repeat BP 154/94 Physical Exam  General appearance -patient is agitated and hypervigilant.  Neck - supple, no significant  adenopathy Chest - clear to auscultation, no wheezes, rales or rhonchi, symmetric air entry Heart - normal rate, regular rhythm, normal S1, S2, no murmurs, rubs, clicks or gallops Extremities - peripheral pulses normal, no pedal edema, no clubbing or cyanosis   ASSESSMENT AND PLAN: 1. Essential hypertension -pt insist on being given a Clonidine tab to bring down BP today. One given.  Amlodipine added to current meds. Encourage low salt intat - cloNIDine (CATAPRES) tablet 0.1 mg; Take 1 tablet (0.1 mg total) by mouth once. - lisinopril-hydrochlorothiazide (PRINZIDE,ZESTORETIC) 20-12.5 MG tablet; Take 1 tablet by mouth daily.  Dispense: 30 tablet; Refill: 6 - metoprolol succinate (TOPROL-XL) 50 MG 24 hr tablet; Take 1 tablet (50 mg total) by mouth daily. Take with or immediately following a meal.  Dispense: 90 tablet; Refill: 3  2. Cough ? Due to ACE-I vs smokers cough. Advise that we change the Lis/HCTZ but pt declined  3. Tobacco dependence Advised to quit. No interested at this time  4. Bipolar disorder, current episode manic without psychotic features, moderate ([place]) -did not get to talk with her about her mental illness and whether she is receiving care. Pt agitated and did not want to talk about that today  Patient was given the opportunity to ask questions.  Patient verbalized understanding of the plan and was able to repeat key elements of the plan.   No orders of the defined types were placed in this encounter.    Requested Prescriptions   Signed Prescriptions Disp Refills  . amLODipine (NORVASC) 5 MG tablet 90 tablet 3    Sig: Take 1 tablet (5 mg total) by mouth daily.  [name] lisinopril-hydrochlorothiazide (PRINZIDE,ZESTORETIC) 20-12.5 MG tablet 30 tablet 6    Sig: Take 1 tablet by mouth daily.  . metoprolol succinate (TOPROL-XL) 50 MG 24 hr tablet 90 tablet 3    Sig: Take 1 tablet (50 mg total) by mouth daily. Take with or immediately following a meal.  . benzonatate  (TESSALON) 100 MG capsule 20 capsule 0    Sig: Take 1 capsule (100 mg total) by mouth 2 (two) times daily as needed for cough.    Return in about 3 months (around 08/15/2017).  Karle Plumber, MD, FACP

## 2017-05-20 ENCOUNTER — Emergency Department (HOSPITAL_COMMUNITY)
Admission: EM | Admit: 2017-05-20 | Discharge: 2017-05-20 | Discharge status: Against Medical Advice | Payer: Self-pay | Attending: Emergency Medicine | Admitting: Emergency Medicine

## 2017-05-20 ENCOUNTER — Encounter (HOSPITAL_COMMUNITY): Payer: Self-pay

## 2017-05-20 ENCOUNTER — Emergency Department (HOSPITAL_COMMUNITY): Payer: Self-pay

## 2017-05-20 DIAGNOSIS — Z79899 Other long term (current) drug therapy: Secondary | ICD-10-CM | POA: Insufficient documentation

## 2017-05-20 DIAGNOSIS — R0789 Other chest pain: Secondary | ICD-10-CM | POA: Insufficient documentation

## 2017-05-20 DIAGNOSIS — F17210 Nicotine dependence, cigarettes, uncomplicated: Secondary | ICD-10-CM | POA: Insufficient documentation

## 2017-05-20 DIAGNOSIS — I10 Essential (primary) hypertension: Secondary | ICD-10-CM | POA: Insufficient documentation

## 2017-05-20 DIAGNOSIS — R079 Chest pain, unspecified: Secondary | ICD-10-CM

## 2017-05-20 DIAGNOSIS — F1410 Cocaine abuse, uncomplicated: Secondary | ICD-10-CM

## 2017-05-20 DIAGNOSIS — F1419 Cocaine abuse with unspecified cocaine-induced disorder: Secondary | ICD-10-CM | POA: Insufficient documentation

## 2017-05-20 LAB — CBC
HEMATOCRIT: 48.8 % — AB (ref 36.0–46.0)
Hemoglobin: 16.4 g/dL — ABNORMAL HIGH (ref 12.0–15.0)
MCH: 30.3 pg (ref 26.0–34.0)
MCHC: 33.6 g/dL (ref 30.0–36.0)
MCV: 90 fL (ref 78.0–100.0)
Platelets: 203 10*3/uL (ref 150–400)
RBC: 5.42 MIL/uL — ABNORMAL HIGH (ref 3.87–5.11)
RDW: 14 % (ref 11.5–15.5)
WBC: 6.8 10*3/uL (ref 4.0–10.5)

## 2017-05-20 LAB — TROPONIN I: Troponin I: <0.03 ng/mL (ref <0.03)

## 2017-05-20 LAB — URINALYSIS, ROUTINE W REFLEX MICROSCOPIC
Bacteria, UA: NONE SEEN
Bilirubin Urine: NEGATIVE
GLUCOSE, UA: NEGATIVE mg/dL
Ketones, ur: 5 mg/dL — AB
NITRITE: NEGATIVE
Protein, ur: 30 mg/dL — AB
Specific Gravity, Urine: 1.018 (ref 1.005–1.030)
pH: 5 (ref 5.0–8.0)

## 2017-05-20 LAB — COMPREHENSIVE METABOLIC PANEL
ALBUMIN: 4 g/dL (ref 3.5–5.0)
ALK PHOS: 59 U/L (ref 38–126)
ALT: 18 U/L (ref 14–54)
AST: 20 U/L (ref 15–41)
Anion gap: 12 (ref 5–15)
BILIRUBIN TOTAL: 0.7 mg/dL (ref 0.3–1.2)
BUN: 19 mg/dL (ref 6–20)
CALCIUM: 9.8 mg/dL (ref 8.9–10.3)
CO2: 25 mmol/L (ref 22–32)
Chloride: 103 mmol/L (ref 101–111)
Creatinine, Ser: 1.27 mg/dL — ABNORMAL HIGH (ref 0.44–1.00)
GFR calc Af Amer: 54 mL/min — ABNORMAL LOW (ref >60)
GFR calc non Af Amer: 47 mL/min — ABNORMAL LOW (ref >60)
GLUCOSE: 89 mg/dL (ref 65–99)
POTASSIUM: 3.7 mmol/L (ref 3.5–5.1)
Sodium: 140 mmol/L (ref 135–145)
TOTAL PROTEIN: 8 g/dL (ref 6.5–8.1)

## 2017-05-20 LAB — RAPID URINE DRUG SCREEN, HOSP PERFORMED
Amphetamines: NOT DETECTED
BARBITURATES: NOT DETECTED
BENZODIAZEPINES: NOT DETECTED
COCAINE: POSITIVE — AB
OPIATES: POSITIVE — AB
TETRAHYDROCANNABINOL: NOT DETECTED

## 2017-05-20 LAB — LIPASE, BLOOD: Lipase: 31 U/L (ref 11–51)

## 2017-05-20 MED ORDER — ONDANSETRON HCL 4 MG/2ML IJ SOLN
4.0000 mg | Freq: Once | INTRAMUSCULAR | Status: AC | PRN
  Administered 2017-05-20: 4 mg via INTRAVENOUS
  Filled 2017-05-20: qty 2

## 2017-05-20 MED ORDER — SODIUM CHLORIDE 0.9 % IV BOLUS (SEPSIS)
1000.0000 mL | Freq: Once | INTRAVENOUS | Status: AC
Start: 2017-05-20 — End: 2017-05-20
  Administered 2017-05-20: 1000 mL via INTRAVENOUS

## 2017-05-20 MED ORDER — FENTANYL CITRATE (PF) 100 MCG/2ML IJ SOLN
50.0000 ug | INTRAMUSCULAR | Status: DC | PRN
  Administered 2017-05-20: 50 ug via INTRAVENOUS
  Filled 2017-05-20: qty 2

## 2017-05-20 MED ORDER — HALOPERIDOL LACTATE 5 MG/ML IJ SOLN
2.0000 mg | Freq: Once | INTRAMUSCULAR | Status: AC
  Administered 2017-05-20: 2 mg via INTRAVENOUS
  Filled 2017-05-20: qty 1

## 2017-05-20 MED ORDER — HYDROMORPHONE HCL 1 MG/ML IJ SOLN
1.0000 mg | Freq: Once | INTRAMUSCULAR | Status: AC
  Administered 2017-05-20: 1 mg via INTRAVENOUS
  Filled 2017-05-20: qty 1

## 2017-05-20 MED ORDER — FENTANYL CITRATE (PF) 100 MCG/2ML IJ SOLN
INTRAMUSCULAR | Status: AC
  Administered 2017-05-20: 50 ug
  Filled 2017-05-20: qty 2

## 2017-05-20 MED ORDER — ONDANSETRON HCL 4 MG/2ML IJ SOLN
4.0000 mg | Freq: Once | INTRAMUSCULAR | Status: AC
  Administered 2017-05-20: 4 mg via INTRAVENOUS
  Filled 2017-05-20: qty 2

## 2017-05-20 MED ORDER — GI COCKTAIL ~~LOC~~
30.0000 mL | Freq: Once | ORAL | Status: AC
  Administered 2017-05-20: 30 mL via ORAL
  Filled 2017-05-20: qty 30

## 2017-05-20 MED ORDER — FAMOTIDINE IN NACL 20-0.9 MG/50ML-% IV SOLN
20.0000 mg | Freq: Once | INTRAVENOUS | Status: AC
  Administered 2017-05-20: 20 mg via INTRAVENOUS
  Filled 2017-05-20: qty 50

## 2017-05-20 NOTE — ED Notes (Signed)
Went in to assess pt, when asked if she has a PCP, pt states that "everything is on the record."  Pt is upset that this nurse is the third person who's asked her same thing, she's made aware that the EDP will ask her the same thing when she comes in to assess her.  Pt states "I don't get it."

## 2017-05-20 NOTE — ED Notes (Signed)
ED Provider at bedside. 

## 2017-05-20 NOTE — ED Triage Notes (Signed)
Patient brought in by EMS from [place] with complaints of abdominal pain, radiating to her back and her bilateral breasts. Patient states "it feels like I have vice grips on my breasts and every rib is broken." Patient denies shortness of breath, but endorses chest pain, nausea/vomiting x3 days. Patient denies diarrhea/fever. Patient has history of hypertension, is a smoker x20years, and drinks "heavily" on the weekends. This Probation officer noted the patients bilateral eyes are jaundiced.

## 2017-05-20 NOTE — ED Notes (Signed)
Pt agitated at this time, c/o pain and rolling around in the bed, with gown almost completely off. Pt was given zofran to help with nausea. Informed pt that I was waiting to get a doctor's order for something for pain. Pain medication order was place. Pt requested at the time of administration that this writer was taking "too long" and requesting if someone else was available to give pain medication due to this writers lack of speed in giving pain medication. Pt verbalized to this Probation officer that she felt I was being "slow on purpose." This Probation officer informed pt that the delay in med admin was in the patient's best interest in order to give medications safely and within my scope of practice. Pt verbalizes understanding at this time.

## 2017-05-20 NOTE — ED Notes (Signed)
Delay in urine sample, lab states urine was not labeled and will need to be recollected

## 2017-05-20 NOTE — ED Notes (Signed)
Bed: WA07 Expected date:  Expected time:  Means of arrival:  Comments: EMS/abd. pain 

## 2017-05-20 NOTE — ED Notes (Signed)
Pt wanted to be disconnected from the heart monitor and have her IV access d/c'd.  She states she called her aunt to pick her up because there is nothing that can be done to stop her pain because "you can't do surgery to take the ulcers out."  Pt appears calmer.  States she just needs to "ride it out."  [facility] EDP made aware

## 2017-05-20 NOTE — ED Provider Notes (Signed)
Patient presented for evaluation of chest discomfort, and vomiting.  Screening evaluation is reassuring.  Patient continued to be uncomfortable despite being able to drink some oral fluids.  Screening urinalysis and urine drug screen indicate cocaine abuse.  Urinalysis does not indicate significant dehydration or infection.  Patient has been treated symptomatically.  19: 15-patient told the nurse she was leaving because she was not getting enough here. She left AMA   Daleen Bo, MD 05/20/17 [phone]

## 2017-05-20 NOTE — ED Provider Notes (Signed)
[facility] DEPT Provider Note   CSN: [id number] Arrival date & time: 05/20/17  1229     History   Chief Complaint Chief Complaint  Patient presents with  . Abdominal Pain    HPI Jennifer [name] is a 56 y.o. female.  Patient is a 55 year old female with a history of hypertension, fibroids, polysubstance abuse including alcoholism, cocaine use, IV drug use and homelessness presenting with severe upper abdominal pain and chest tightness.  Patient states for the last 3 days she's had upper abdominal pain that seems worse with eating. She has had nausea vomiting and diarrhea. She states this morning when she woke up and seemed to be over better until she ate something and then the pain returned and was severe. It seems that the pain makes her feel like she has pressure over her breasts. She denies cough or shortness of breath. No urinary or vaginal symptoms at this time. She denies any lower extremity pain or swelling.  Patient is still taking her blood pressure medication and is taking PPI.   The history is provided by the patient.  Abdominal Pain   This is a new problem. Episode onset: 3 days ago. The problem occurs constantly. The problem has been rapidly worsening. The pain is associated with an unknown factor. The pain is located in the epigastric region. The quality of the pain is cramping, colicky and shooting. The pain is at a severity of 10/10. The pain is severe. Associated symptoms include anorexia, diarrhea, nausea and vomiting. Pertinent negatives include fever. The symptoms are aggravated by eating. Nothing relieves the symptoms.    Past Medical History:  Diagnosis Date  . bipolar   . Depression   . Fibroids    now s/p hysterectomy  . Hypertension   . Left ventricular hypertrophy   . Polysubstance abuse ([place])    IV drug [place], cocaine on occassion, smoking and alcoholism, has been going to AA, not completely sober    Patient Active Problem List   Diagnosis Date  Noted  . Current smoker 08/12/2016  . Homeless single person 08/12/2016  . Skin lesion of left leg 03/29/2015  . Trigger middle finger of right hand 01/11/2015  . Discoloration of eye 01/11/2015  . Bradycardia 01/11/2015  . Skin lesion of scalp 07/17/2014  . Leg swelling 05/20/2013  . Vasomotor rhinitis 08/17/2012  . Right leg pain 07/26/2012  . Anxiety 02/26/2012  . GERD (gastroesophageal reflux disease) 08/08/2011  . Menopausal symptoms 07/23/2011  . Mastalgia 07/23/2011  . Bipolar disorder, current episode manic without psychotic features, moderate ([place]) 12/05/2010  . 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 SEBACEOUS CYST, SCALP 05/06/2010  . SYNOVIAL CYST 05/03/2010  . Abdominal pain 12/06/2009  . DRUG ABUSE, HX OF 12/06/2009  . Lipoma of shoulder 02/06/2009  . FIBROIDS, UTERUS 02/06/2009  . DRUG ABUSE 02/06/2009  . Essential hypertension, benign 02/06/2009  . PAIN IN JOINT, SHOULDER REGION 02/06/2009  . Insomnia 02/06/2009  . PELVIC MASS 02/06/2009    Past Surgical History:  Procedure Laterality Date  . ABDOMINAL HYSTERECTOMY     2003  . BILATERAL SALPINGOOPHORECTOMY     01/2010  . DILATION AND CURETTAGE OF UTERUS    . EXPLORATORY LAPAROTOMY     complex pelvic mass 2011  . TONSILLECTOMY      OB History    Gravida Para Term Preterm AB Living   5 0 0 0 4 0   SAB TAB Ectopic Multiple Live Births   1 0 0 0  Home Medications    Prior to Admission medications   Medication Sig Start Date End Date Taking? Authorizing Provider  amLODipine (NORVASC) 5 MG tablet Take 1 tablet (5 mg total) by mouth daily. 05/15/17   Ladell Pier, MD  benzonatate (TESSALON) 100 MG capsule Take 1 capsule (100 mg total) by mouth 2 (two) times daily as needed for cough. 05/15/17   Ladell Pier, MD  lisinopril-hydrochlorothiazide (PRINZIDE,ZESTORETIC) 20-12.5 MG tablet Take 1 tablet by mouth daily. 05/15/17   Ladell Pier, MD  metoprolol succinate (TOPROL-XL) 50 MG 24 hr  tablet Take 1 tablet (50 mg total) by mouth daily. Take with or immediately following a meal. 05/15/17   Ladell Pier, MD  pantoprazole (PROTONIX) 40 MG tablet Take 1 tablet (40 mg total) by mouth daily before supper. Patient not taking: Reported on 04/27/2017 09/30/16   Boykin Nearing, MD  PARoxetine (PAXIL) 40 MG tablet TAKE 1 TABLET (40 MG TOTAL) BY MOUTH DAILY. 05/11/17   Ladell Pier, MD    Family History Family History  Problem Relation Age of Onset  . Hypertension Mother   . Hypertension Sister   . Diabetes Father     Social History Social History  Substance Use Topics  . Smoking status: Current Every Day Smoker    Packs/day: 0.50    Years: 20.00    Types: Cigarettes  . Smokeless tobacco: Never Used     Comment: Wants to quit.   . Alcohol use No     Allergies   Ibuprofen   Review of Systems Review of Systems  Constitutional: Negative for fever.  Gastrointestinal: Positive for abdominal pain, anorexia, diarrhea, nausea and vomiting.  All other systems reviewed and are negative.    Physical Exam Updated Vital Signs BP (!) 157/101   Pulse 64   Temp 98.7 F (37.1 C) (Oral)   Resp 17   Wt 82.1 kg (181 lb)   LMP 12/04/2009   SpO2 92%   BMI 31.07 kg/m   Physical Exam  Constitutional: She is oriented to person, place, and time. She appears well-developed and well-nourished. She appears distressed.  Seems uncomfortable  HENT:  Head: Normocephalic and atraumatic.  Mouth/Throat: Oropharynx is clear and moist.  Eyes: Pupils are equal, round, and reactive to light. Conjunctivae and EOM are normal.  Neck: Normal range of motion. Neck supple.  Cardiovascular: Normal rate, regular rhythm and intact distal pulses.   No murmur heard. Pulmonary/Chest: Effort normal and breath sounds normal. No respiratory distress. She has no wheezes. She has no rales.  Abdominal: Soft. She exhibits no distension. There is tenderness in the epigastric area. There is no  rebound and no guarding.  Musculoskeletal: Normal range of motion. She exhibits no edema or tenderness.  Neurological: She is alert and oriented to person, place, and time.  Skin: Skin is warm and dry. Capillary refill takes less than 2 seconds. No rash noted. No erythema.  Psychiatric: She has a normal mood and affect. Her behavior is normal.  Nursing note and vitals reviewed.    ED Treatments / Results  Labs (all labs ordered are listed, but only abnormal results are displayed) Labs Reviewed  COMPREHENSIVE METABOLIC PANEL - Abnormal; Notable for the following:       Result Value   Creatinine, Ser 1.27 (*)    GFR calc non Af Amer 47 (*)    GFR calc Af Amer 54 (*)    All other components within normal limits  CBC - Abnormal;  Notable for the following:    RBC 5.42 (*)    Hemoglobin 16.4 (*)    HCT 48.8 (*)    All other components within normal limits  LIPASE, BLOOD  URINALYSIS, ROUTINE W REFLEX MICROSCOPIC  TROPONIN I    EKG  EKG Interpretation  Date/Time:  Wednesday May 20 2017 13:02:46 EDT Ventricular Rate:  62 PR Interval:    QRS Duration: 152 QT Interval:  468 QTC Calculation: 476 R Axis:   74 Text Interpretation:  Sinus rhythm Probable left atrial enlargement Right bundle branch block LVH with IVCD and secondary repol abnrm No significant change since last tracing Confirmed by Blanchie Dessert [id number]) on 05/20/2017 2:35:59 PM       Radiology No results found.  Procedures Procedures (including critical care time)  Medications Ordered in ED Medications  fentaNYL (SUBLIMAZE) injection 50 mcg (not administered)  sodium chloride 0.9 % bolus 1,000 mL (1,000 mLs Intravenous New Bag/Given 05/20/17 1506)  ondansetron (ZOFRAN) injection 4 mg (not administered)  famotidine (PEPCID) IVPB 20 mg premix (20 mg Intravenous New Bag/Given 05/20/17 1507)  ondansetron (ZOFRAN) injection 4 mg (4 mg Intravenous Given 05/20/17 1408)  fentaNYL (SUBLIMAZE) 100 MCG/2ML  injection (50 mcg  Given 05/20/17 1413)  HYDROmorphone (DILAUDID) injection 1 mg (1 mg Intravenous Given 05/20/17 1502)     Initial Impression / Assessment and Plan / ED Course  I have reviewed the triage vital signs and the nursing notes.  Pertinent labs & imaging results that were available during my care of the patient were reviewed by me and considered in my medical decision making (see chart for details).    Patient presenting with 3 days of worsening abdominal pain. Patient is having complaints of severe epigastric pain that radiates into her back. She does have a history of alcohol abuse and does admit to drinking heavily.  Patient's vital signs are within normal limits. Patient's labs without significant findings. She has a normal lipase, CBC and CMP. Patient's EKG is grossly abnormal. However when looking back at prior EKGs they are unchanged. Troponin is pending. Patient given IV fluids, pain, nausea medication and PPI.  Most likely patient's symptoms are related to gastritis versus gastric ulcer. She does deny any bloody emesis. Low suspicion for UTI or vaginal source. Chest x-ray is pending but lower suspicion for dissection, MI, pneumothorax or pneumonia.  Final Clinical Impressions(s) / ED Diagnoses   Final diagnoses:  None    New Prescriptions New Prescriptions   No medications on file     Blanchie Dessert, MD 05/24/17 1905

## 2017-05-20 NOTE — ED Notes (Signed)
Pt reports able to eat some of a piece of cake from meal tray, but it "hurts going down"  No emesis noted after eating

## 2017-06-09 ENCOUNTER — Ambulatory Visit: Payer: Self-pay | Admitting: Internal Medicine

## 2017-06-17 MED FILL — LISINOPRIL-HCTZ 20-12.5 MG: 20-12.5 | 30 days supply | Qty: 30 | Fill #1

## 2017-06-17 MED FILL — AMLODIPINE BESYLATE 5 MG TA: 5 | 30 days supply | Qty: 30 | Fill #1

## 2017-06-20 ENCOUNTER — Inpatient Hospital Stay (HOSPITAL_COMMUNITY)
Admission: AD | Admit: 2017-06-20 | Discharge: 2017-06-20 | Disposition: A | Discharge status: Home / Self Care | Payer: Self-pay | Source: Ambulatory Visit | Attending: Obstetrics & Gynecology | Admitting: Obstetrics & Gynecology

## 2017-06-20 ENCOUNTER — Encounter (HOSPITAL_COMMUNITY): Payer: Self-pay

## 2017-06-20 DIAGNOSIS — I10 Essential (primary) hypertension: Secondary | ICD-10-CM | POA: Insufficient documentation

## 2017-06-20 DIAGNOSIS — F1490 Cocaine use, unspecified, uncomplicated: Secondary | ICD-10-CM | POA: Insufficient documentation

## 2017-06-20 DIAGNOSIS — F17210 Nicotine dependence, cigarettes, uncomplicated: Secondary | ICD-10-CM | POA: Insufficient documentation

## 2017-06-20 HISTORY — DX: Bipolar disorder, unspecified: F31.9

## 2017-06-20 LAB — URINALYSIS, ROUTINE W REFLEX MICROSCOPIC
Bilirubin Urine: NEGATIVE
Glucose, UA: NEGATIVE mg/dL
Hgb urine dipstick: NEGATIVE
Ketones, ur: NEGATIVE mg/dL
LEUKOCYTES UA: NEGATIVE
NITRITE: NEGATIVE
Protein, ur: NEGATIVE mg/dL
Specific Gravity, Urine: 1.033 — ABNORMAL HIGH (ref 1.005–1.030)
pH: 5 (ref 5.0–8.0)

## 2017-06-20 LAB — RAPID URINE DRUG SCREEN, HOSP PERFORMED
AMPHETAMINES: NOT DETECTED
Barbiturates: NOT DETECTED
Benzodiazepines: NOT DETECTED
Cocaine: POSITIVE — AB
Opiates: NOT DETECTED
TETRAHYDROCANNABINOL: NOT DETECTED

## 2017-06-20 MED ORDER — AMLODIPINE BESYLATE 5 MG PO TABS
5.0000 mg | ORAL_TABLET | Freq: Once | ORAL | Status: AC
  Administered 2017-06-20: 5 mg via ORAL
  Filled 2017-06-20: qty 1

## 2017-06-20 MED ORDER — LISINOPRIL 20 MG PO TABS
20.0000 mg | ORAL_TABLET | Freq: Once | ORAL | Status: AC
  Administered 2017-06-20: 20 mg via ORAL
  Filled 2017-06-20: qty 1

## 2017-06-20 MED ORDER — HYDROCHLOROTHIAZIDE 25 MG PO TABS
25.0000 mg | ORAL_TABLET | Freq: Once | ORAL | Status: AC
  Administered 2017-06-20: 25 mg via ORAL
  Filled 2017-06-20: qty 1

## 2017-06-20 MED ORDER — METOPROLOL SUCCINATE ER 50 MG PO TB24: 50.0000 mg | ORAL_TABLET | Freq: Once | ORAL | Status: DC

## 2017-06-20 NOTE — Discharge Instructions (Signed)
Hypertension Hypertension, commonly called high blood pressure, is when the force of blood pumping through the arteries is too strong. The arteries are the blood vessels that carry blood from the heart throughout the body. Hypertension forces the heart to work harder to pump blood and may cause arteries to become narrow or stiff. Having untreated or uncontrolled hypertension can cause heart attacks, strokes, kidney disease, and other problems. A blood pressure reading consists of a higher number over a lower number. Ideally, your blood pressure should be below 120/80. The first ("top") number is called the systolic pressure. It is a measure of the pressure in your arteries as your heart beats. The second ("bottom") number is called the diastolic pressure. It is a measure of the pressure in your arteries as the heart relaxes. What are the causes? The cause of this condition is not known. What increases the risk? Some risk factors for high blood pressure are under your control. Others are not. Factors you can change  Smoking.  Having type 2 diabetes mellitus, high cholesterol, or both.  Not getting enough exercise or physical activity.  Being overweight.  Having too much fat, sugar, calories, or salt (sodium) in your diet.  Drinking too much alcohol. Factors that are difficult or impossible to change  Having chronic kidney disease.  Having a family history of high blood pressure.  Age. Risk increases with age.  Race. You may be at higher risk if you are African-American.  Gender. Men are at higher risk than women before age 59. After age 32, women are at higher risk than men.  Having obstructive sleep apnea.  Stress. What are the signs or symptoms? Extremely high blood pressure (hypertensive crisis) may cause:  Headache.  Anxiety.  Shortness of breath.  Nosebleed.  Nausea and vomiting.  Severe chest pain.  Jerky movements you cannot control (seizures).  How is this  diagnosed? This condition is diagnosed by measuring your blood pressure while you are seated, with your arm resting on a surface. The cuff of the blood pressure monitor will be placed directly against the skin of your upper arm at the level of your heart. It should be measured at least twice using the same arm. Certain conditions can cause a difference in blood pressure between your right and left arms. Certain factors can cause blood pressure readings to be lower or higher than normal (elevated) for a short period of time:  When your blood pressure is higher when you are in a health care provider's office than when you are at home, this is called white coat hypertension. Most people with this condition do not need medicines.  When your blood pressure is higher at home than when you are in a health care provider's office, this is called masked hypertension. Most people with this condition may need medicines to control blood pressure.  If you have a high blood pressure reading during one visit or you have normal blood pressure with other risk factors:  You may be asked to return on a different day to have your blood pressure checked again.  You may be asked to monitor your blood pressure at home for 1 week or longer.  If you are diagnosed with hypertension, you may have other blood or imaging tests to help your health care provider understand your overall risk for other conditions. How is this treated? This condition is treated by making healthy lifestyle changes, such as eating healthy foods, exercising more, and reducing your alcohol intake. Your  health care provider may prescribe medicine if lifestyle changes are not enough to get your blood pressure under control, and if:  Your systolic blood pressure is above 130.  Your diastolic blood pressure is above 80.  Your personal target blood pressure may vary depending on your medical conditions, your age, and other factors. Follow these  instructions at home: Eating and drinking  Eat a diet that is high in fiber and potassium, and low in sodium, added sugar, and fat. An example eating plan is called the DASH (Dietary Approaches to Stop Hypertension) diet. To eat this way: ? Eat plenty of fresh fruits and vegetables. Try to fill half of your plate at each meal with fruits and vegetables. ? Eat whole grains, such as whole wheat pasta, brown rice, or whole grain bread. Fill about one quarter of your plate with whole grains. ? Eat or drink low-fat dairy products, such as skim milk or low-fat yogurt. ? Avoid fatty cuts of meat, processed or cured meats, and poultry with skin. Fill about one quarter of your plate with lean proteins, such as fish, chicken without skin, beans, eggs, and tofu. ? Avoid premade and processed foods. These tend to be higher in sodium, added sugar, and fat.  Reduce your daily sodium intake. Most people with hypertension should eat less than 1,500 mg of sodium a day.  Limit alcohol intake to no more than 1 drink a day for nonpregnant women and 2 drinks a day for men. One drink equals 12 oz of beer, 5 oz of wine, or 1 oz of hard liquor. Lifestyle  Work with your health care provider to maintain a healthy body weight or to lose weight. Ask what an ideal weight is for you.  Get at least 30 minutes of exercise that causes your heart to beat faster (aerobic exercise) most days of the week. Activities may include walking, swimming, or biking.  Include exercise to strengthen your muscles (resistance exercise), such as pilates or lifting weights, as part of your weekly exercise routine. Try to do these types of exercises for 30 minutes at least 3 days a week.  Do not use any products that contain nicotine or tobacco, such as cigarettes and e-cigarettes. If you need help quitting, ask your health care provider.  Monitor your blood pressure at home as told by your health care provider.  Keep all follow-up visits as  told by your health care provider. This is important. Medicines  Take over-the-counter and prescription medicines only as told by your health care provider. Follow directions carefully. Blood pressure medicines must be taken as prescribed.  Do not skip doses of blood pressure medicine. Doing this puts you at risk for problems and can make the medicine less effective.  Ask your health care provider about side effects or reactions to medicines that you should watch for. Contact a health care provider if:  You think you are having a reaction to a medicine you are taking.  You have headaches that keep coming back (recurring).  You feel dizzy.  You have swelling in your ankles.  You have trouble with your vision. Get help right away if:  You develop a severe headache or confusion.  You have unusual weakness or numbness.  You feel faint.  You have severe pain in your chest or abdomen.  You vomit repeatedly.  You have trouble breathing. Summary  Hypertension is when the force of blood pumping through your arteries is too strong. If this condition is not  controlled, it may put you at risk for serious complications.  Your personal target blood pressure may vary depending on your medical conditions, your age, and other factors. For most people, a normal blood pressure is less than 120/80.  Hypertension is treated with lifestyle changes, medicines, or a combination of both. Lifestyle changes include weight loss, eating a healthy, low-sodium diet, exercising more, and limiting alcohol. This information is not intended to replace advice given to you by your health care provider. Make sure you discuss any questions you have with your health care provider. Document Released: 07/28/2005 Document Revised: 06/25/2016 Document Reviewed: 06/25/2016 Elsevier Interactive Patient Education  [employer]. In late 2019, the [facility] will be moving to the [place]. At that  time, the MAU (Maternity Admissions Unit), where you are being seen today, will no longer see non-pregnant patients. We strongly encourage you to find a doctor's office before that time, so that you can be seen with any GYN concerns, like vaginal discharge, urinary tract infection, etc.. in a timely manner.   In order to make the office visit more convenient, the [facility] will be offering evening hours from 4pm-7:30pm on Monday. There will be same-day appointments, walk-in appointments and scheduled appointments available during this time. We will be adding more evening hours over the next year before the move.   [facility] [place] [facility] [phone]  For urgent needs, [facility] is also available for management of urgent GYN complaints such as vaginal discharge or urinary tract infections.

## 2017-06-20 NOTE — MAU Note (Signed)
States she is unable to pick up Rx for BP meds and she is here to get some medicine for her BP. No other complaints

## 2017-06-20 NOTE — MAU Provider Note (Signed)
Chief Complaint:  High Blood Pressure   First Provider Initiated Contact with Patient 06/20/17 1316    HPI: Jennifer [name] is a 56 y.o. [id number] who presents to maternity admissions reporting High Blood Pressure.  States took her BP at home and it was very high.  Worried it will hurt her heart. Cannot pick up prescriptions until Monday, so wants [place] to give her three pills today "and then I'll go home".  Denies chest pain or other symptoms.  She denies vaginal bleeding, vaginal itching/burning, urinary symptoms, h/a, dizziness, n/v, or fever/chills.    Was very belligerent in waiting room when asked to sign in.  Stated she already signed in (initial chief complaint form) but refuses to sign in so we can get her into system.  Yelling and angry.  Security and supervisor present, trying to calm her down.  She finally agreed to sign in when told we cannot see her if she doesn't sign in.    Hypertension  This is a recurrent problem. The current episode started today. The problem is unchanged. The problem is uncontrolled. Associated symptoms include anxiety. Pertinent negatives include no chest pain, headaches, palpitations, peripheral edema or shortness of breath.    RN Note: States she is unable to pick up Rx for BP meds and she is here to get some medicine for her BP. No other complaints    Past Medical History: Past Medical History:  Diagnosis Date  . bipolar   . Depression   . Fibroids    now s/p hysterectomy  . Hypertension   . Left ventricular hypertrophy   . Polysubstance abuse ([place])    IV drug [place], cocaine on occassion, smoking and alcoholism, has been going to AA, not completely sober    Past obstetric history: OB History  Gravida Para Term Preterm AB Living  5 0 0 0 4 0  SAB TAB Ectopic Multiple Live Births  1 0 0 0      # Outcome Date GA Lbr Len/2nd Weight Sex Delivery Anes PTL Lv  5 Gravida           4 SAB           3 AB           2 AB           1 AB                Past Surgical History: Past Surgical History:  Procedure Laterality Date  . ABDOMINAL HYSTERECTOMY     2003  . BILATERAL SALPINGOOPHORECTOMY     01/2010  . DILATION AND CURETTAGE OF UTERUS    . EXPLORATORY LAPAROTOMY     complex pelvic mass 2011  . TONSILLECTOMY      Family History: Family History  Problem Relation Age of Onset  . Hypertension Mother   . Hypertension Sister   . Diabetes Father     Social History: Social History   Tobacco Use  . Smoking status: Current Every Day Smoker    Packs/day: 0.50    Years: 20.00    Pack years: 10.00    Types: Cigarettes  . Smokeless tobacco: Never Used  . Tobacco comment: Wants to quit.   Substance Use Topics  . Alcohol use: No    Alcohol/week: 0.0 oz  . Drug use: No    Allergies: No Known Allergies  Meds:  Medications Prior to Admission  Medication Sig Dispense Refill Last Dose  . amLODipine (NORVASC) 5 MG  tablet Take 1 tablet (5 mg total) by mouth daily. 90 tablet 3 05/20/2017 at Unknown time  . benzonatate (TESSALON) 100 MG capsule Take 1 capsule (100 mg total) by mouth 2 (two) times daily as needed for cough. 20 capsule 0 unknown  . lisinopril-hydrochlorothiazide (PRINZIDE,ZESTORETIC) 20-12.5 MG tablet Take 1 tablet by mouth daily. 30 tablet 6 05/20/2017 at Unknown time  . metoprolol succinate (TOPROL-XL) 50 MG 24 hr tablet Take 1 tablet (50 mg total) by mouth daily. Take with or immediately following a meal. 90 tablet 3 05/20/2017 at morning  . pantoprazole (PROTONIX) 40 MG tablet Take 1 tablet (40 mg total) by mouth daily before supper. (Patient not taking: Reported on 04/27/2017) 30 tablet 3 Not Taking at Unknown time  . PARoxetine (PAXIL) 40 MG tablet TAKE 1 TABLET (40 MG TOTAL) BY MOUTH DAILY. 30 tablet 0 05/20/2017 at Unknown time    I have reviewed patient's Past Medical Hx, Surgical Hx, Family Hx, Social Hx, medications and allergies.  ROS:  Review of Systems  Respiratory: Negative for shortness of breath.    Cardiovascular: Negative for chest pain and palpitations.  Neurological: Negative for headaches.   Other systems negative     Physical Exam  No data found.  Vitals:   06/20/17 1349 06/20/17 1401 06/20/17 1415 06/20/17 1424  BP: (!) 174/106 (!) 176/111 (!) 171/120 (!) 174/114  Pulse: 69 67 68 66  Resp:      Temp:      TempSrc:        Constitutional: Well-developed, well-nourished female in no acute distress.  Cardiovascular: normal rate and rhythm, no ectopy audible, S1 & S2 heard, no murmur Respiratory: normal effort, no distress. Lungs CTAB with no wheezes or crackles GI: Abd soft, non-tender.  Nondistended.  No rebound, No guarding.  Bowel Sounds audible  MS: Extremities nontender, no edema, normal ROM Neurologic: Alert and oriented x 4.   Grossly nonfocal. GU: Neg CVAT. Skin:  Warm and Dry Psych:  Affect appropriate.  PELVIC EXAM: deferred/not indicated   Labs: Results for orders placed or performed during the hospital encounter of 06/20/17 (from the past 24 hour(s))  Urinalysis, Routine w reflex microscopic     Status: Abnormal   Collection Time: 06/20/17  1:10 PM  Result Value Ref Range   Color, Urine YELLOW YELLOW   APPearance CLEAR CLEAR   Specific Gravity, Urine 1.033 (H) 1.005 - 1.030   pH 5.0 5.0 - 8.0   Glucose, UA NEGATIVE NEGATIVE mg/dL   Hgb urine dipstick NEGATIVE NEGATIVE   Bilirubin Urine NEGATIVE NEGATIVE   Ketones, ur NEGATIVE NEGATIVE mg/dL   Protein, ur NEGATIVE NEGATIVE mg/dL   Nitrite NEGATIVE NEGATIVE   Leukocytes, UA NEGATIVE NEGATIVE  Urine rapid drug screen (hosp performed)     Status: Abnormal   Collection Time: 06/20/17  1:10 PM  Result Value Ref Range   Opiates NONE DETECTED NONE DETECTED   Cocaine POSITIVE (A) NONE DETECTED   Benzodiazepines NONE DETECTED NONE DETECTED   Amphetamines NONE DETECTED NONE DETECTED   Tetrahydrocannabinol NONE DETECTED NONE DETECTED   Barbiturates NONE DETECTED NONE DETECTED      Imaging:  No  results found.  MAU Course/MDM: Discussed presentation and BP readings with Dr Roselie Awkward He recommends giving her the three meds and discharging home to followup with her primary doctor  Discussed use of Metoprolol with + cocaine screen with Pharmacist.  She recommends not giving that today We will need to give the Lisinopril and HCTZ as separate  pills, so pt will receive 3 pills. Will hold Metoprolol  Discussed Hypertension with patient  Discussed the meds given today will not last through Monday. I recommended she try to keep her meds supplied and take as directed.  Discussed relaxation exercises and avoidance of cocaine.  Discussed history of cocaine use and associated risks including but not limited to Hypertension, Heart attack, heart rhythm disturbance when used with Metoprolol. Patient acknowledges above   Has not been belligerent with me but initially very animated with talking. I recommended she discuss her Metoprolol use with her primary doctor Discussed we are a specialty hospital and hypertension is best treated by her primary doctor  RX'es per last MD office visit:        Signed Prescriptions Disp Refills  . amLODipine (NORVASC) 5 MG tablet 90 tablet 3    Sig: Take 1 tablet (5 mg total) by mouth daily.  [name] lisinopril-hydrochlorothiazide (PRINZIDE,ZESTORETIC) 20-12.5 MG tablet 30 tablet 6    Sig: Take 1 tablet by mouth daily.  . metoprolol succinate (TOPROL-XL) 50 MG 24 hr tablet 90 tablet 3    Sig: Take 1 tablet (50 mg total) by mouth daily. Take with or immediately following a meal.  . benzonatate (TESSALON) 100 MG capsule 20 capsule 0    Sig: Take 1 capsule (100 mg total) by mouth 2 (two) times daily as needed for cough.       I have ordered labs as follows: UA, UDS (due to angry outbursts, aggression, Hypertension and history) Imaging ordered: none Results reviewed. Discussed hypertension can adversely affect overall health, especially kidneys and heart.  Warned to return to ED if she experiences chest pain or shortness of breath.  Stop cocaine use.   Consult Dr Roselie Awkward who recommends giving her usual meds and discharge home.  Effects will likely be more optimal later, do not need to keep her for several hours to observe response.   Treatments in MAU included Antihypertensives.   Pt stable at time of discharge.  Assessment: Hypertension, severe Cocaine use, possible intoxication (evidenced by belligerence and aggressive behavior) No other somatic complaints  Plan: Discharge home Recommend followup with primary doctor on Monday  See above for further recommendations  Encouraged to return here or to other Urgent Care/ED if she develops worsening of symptoms, increase in pain, fever, or other concerning symptoms.   Hansel Feinstein CNM, MSN Certified Nurse-Midwife 06/20/2017 1:17 PM

## 2017-06-22 MED FILL — METOPROLOL SUCC ER 50 MG TA: 50 | 30 days supply | Qty: 30 | Fill #0

## 2017-06-29 ENCOUNTER — Ambulatory Visit: Payer: Self-pay | Admitting: Internal Medicine

## 2017-07-13 MED FILL — AMLODIPINE BESYLATE 5 MG TA: 5 | 30 days supply | Qty: 30 | Fill #2

## 2017-07-13 MED FILL — LISINOPRIL-HCTZ 20-12.5 MG: 20-12.5 | 30 days supply | Qty: 30 | Fill #2

## 2017-07-14 MED FILL — METOPROLOL SUCC ER 50 MG TA: 50 | 30 days supply | Qty: 30 | Fill #10

## 2017-07-21 ENCOUNTER — Ambulatory Visit (HOSPITAL_COMMUNITY): Payer: Self-pay

## 2017-07-22 ENCOUNTER — Encounter: Payer: Self-pay | Admitting: Internal Medicine

## 2017-07-22 ENCOUNTER — Other Ambulatory Visit: Payer: Self-pay | Admitting: Internal Medicine

## 2017-07-22 ENCOUNTER — Ambulatory Visit: Payer: Self-pay | Attending: Internal Medicine | Admitting: Internal Medicine

## 2017-07-22 VITALS — BP 151/92 | HR 71 | Temp 98.4°F | Resp 18 | Ht 65.0 in | Wt 193.4 lb

## 2017-07-22 DIAGNOSIS — F4322 Adjustment disorder with anxiety: Secondary | ICD-10-CM | POA: Insufficient documentation

## 2017-07-22 DIAGNOSIS — F3112 Bipolar disorder, current episode manic without psychotic features, moderate: Secondary | ICD-10-CM | POA: Insufficient documentation

## 2017-07-22 DIAGNOSIS — K219 Gastro-esophageal reflux disease without esophagitis: Secondary | ICD-10-CM | POA: Insufficient documentation

## 2017-07-22 DIAGNOSIS — Z79899 Other long term (current) drug therapy: Secondary | ICD-10-CM | POA: Insufficient documentation

## 2017-07-22 DIAGNOSIS — M791 Myalgia, unspecified site: Secondary | ICD-10-CM | POA: Insufficient documentation

## 2017-07-22 DIAGNOSIS — I10 Essential (primary) hypertension: Secondary | ICD-10-CM | POA: Insufficient documentation

## 2017-07-22 MED ORDER — PAROXETINE HCL 40 MG PO TABS: 40.0000 mg | ORAL_TABLET | Freq: Every day | ORAL | 4 refills | Status: DC

## 2017-07-22 MED ORDER — ACETAMINOPHEN 500 MG PO TABS: 500.0000 mg | ORAL_TABLET | Freq: Three times a day (TID) | ORAL | 0 refills | Status: DC | PRN

## 2017-07-22 MED ORDER — METHOCARBAMOL 500 MG PO TABS: 500.0000 mg | ORAL_TABLET | Freq: Two times a day (BID) | ORAL | 0 refills | Status: DC | PRN

## 2017-07-22 MED FILL — METHOCARBAMOL 500 MG TABS: 500 | 7 days supply | Qty: 14 | Fill #0

## 2017-07-22 MED FILL — ?PAROXETINE HCL 40 MG TABS: 40 | 30 days supply | Qty: 30 | Fill #0

## 2017-07-22 NOTE — Progress Notes (Signed)
Patient is here for a blood pressure. Patient stated that she is having a headaches and she would like a increase dose on Paxil.

## 2017-07-22 NOTE — Patient Instructions (Signed)
You should establish care at [facility] for your bipolar disorder.
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
## 2017-07-23 ENCOUNTER — Ambulatory Visit (HOSPITAL_COMMUNITY): Payer: Self-pay

## 2017-08-24 MED FILL — ?AMLODIPINE BESYLATE 5 MG T: 5 MG | 30 days supply | Qty: 30 | Fill #3

## 2017-08-24 MED FILL — ?PAROXETINE HCL 40 MG TABS: 40 | 30 days supply | Qty: 30 | Fill #1

## 2017-08-26 MED FILL — METOPROLOL SUCCINATE ER 50: 50 | 30 days supply | Qty: 30 | Fill #1

## 2017-08-26 MED FILL — LISINOPRIL-HCTZ 20-12.5 MG: 20-12.5 | 30 days supply | Qty: 30 | Fill #3

## 2017-08-28 ENCOUNTER — Encounter: Payer: Self-pay | Admitting: Internal Medicine

## 2017-08-28 ENCOUNTER — Ambulatory Visit: Payer: Self-pay | Admitting: Licensed Clinical Social Worker

## 2017-08-28 ENCOUNTER — Ambulatory Visit: Payer: Self-pay | Attending: Internal Medicine | Admitting: Internal Medicine

## 2017-08-28 VITALS — BP 130/90 | HR 60 | Temp 98.5°F | Resp 16 | Ht 64.0 in | Wt 192.6 lb

## 2017-08-28 DIAGNOSIS — F329 Major depressive disorder, single episode, unspecified: Secondary | ICD-10-CM | POA: Insufficient documentation

## 2017-08-28 DIAGNOSIS — H539 Unspecified visual disturbance: Secondary | ICD-10-CM

## 2017-08-28 DIAGNOSIS — Z8659 Personal history of other mental and behavioral disorders: Secondary | ICD-10-CM

## 2017-08-28 DIAGNOSIS — I10 Essential (primary) hypertension: Secondary | ICD-10-CM | POA: Insufficient documentation

## 2017-08-28 DIAGNOSIS — F17210 Nicotine dependence, cigarettes, uncomplicated: Secondary | ICD-10-CM | POA: Insufficient documentation

## 2017-08-28 DIAGNOSIS — Z23 Encounter for immunization: Secondary | ICD-10-CM | POA: Insufficient documentation

## 2017-08-28 DIAGNOSIS — Z1239 Encounter for other screening for malignant neoplasm of breast: Secondary | ICD-10-CM

## 2017-08-28 DIAGNOSIS — F419 Anxiety disorder, unspecified: Secondary | ICD-10-CM | POA: Insufficient documentation

## 2017-08-28 DIAGNOSIS — K219 Gastro-esophageal reflux disease without esophagitis: Secondary | ICD-10-CM | POA: Insufficient documentation

## 2017-08-28 DIAGNOSIS — Z113 Encounter for screening for infections with a predominantly sexual mode of transmission: Secondary | ICD-10-CM | POA: Insufficient documentation

## 2017-08-28 DIAGNOSIS — Z1231 Encounter for screening mammogram for malignant neoplasm of breast: Secondary | ICD-10-CM

## 2017-08-28 NOTE — Progress Notes (Signed)
Patient is here wanting to get some blood work done for sexual disease. Patient request if she can get a mammogram and ophthalmology referrals.

## 2017-08-28 NOTE — Patient Instructions (Addendum)
You should have an eye exam at [facility]. Use condoms to help prevent getting sexually transmitted infections.   Influenza Virus Vaccine injection (Fluarix) What is this medicine? INFLUENZA VIRUS VACCINE (in floo EN zuh VAHY ruhs vak SEEN) helps to reduce the risk of getting influenza also known as the flu. This medicine may be used for other purposes; ask your health care provider or pharmacist if you have questions. COMMON BRAND NAME(S): Fluarix, Fluzone What should I tell my health care provider before I take this medicine? They need to know if you have any of these conditions: -bleeding disorder like hemophilia -fever or infection -Guillain-Barre syndrome or other neurological problems -immune system problems -infection with the human immunodeficiency virus (HIV) or AIDS -low blood platelet counts -multiple sclerosis -an unusual or allergic reaction to influenza virus vaccine, eggs, chicken proteins, latex, gentamicin, other medicines, foods, dyes or preservatives -pregnant or trying to get pregnant -breast-feeding How should I use this medicine? This vaccine is for injection into a muscle. It is given by a health care professional. A copy of Vaccine Information Statements will be given before each vaccination. Read this sheet carefully each time. The sheet may change frequently. Talk to your pediatrician regarding the use of this medicine in children. Special care may be needed. Overdosage: If you think you have taken too much of this medicine contact a poison control center or emergency room at once. NOTE: This medicine is only for you. Do not share this medicine with others. What if I miss a dose? This does not apply. What may interact with this medicine? -chemotherapy or radiation therapy -medicines that lower your immune system like etanercept, anakinra, infliximab, and adalimumab -medicines that treat or prevent blood clots like warfarin -phenytoin -steroid medicines like  prednisone or cortisone -theophylline -vaccines This list may not describe all possible interactions. Give your health care provider a list of all the medicines, herbs, non-prescription drugs, or dietary supplements you use. Also tell them if you smoke, drink alcohol, or use illegal drugs. Some items may interact with your medicine. What should I watch for while using this medicine? Report any side effects that do not go away within 3 days to your doctor or health care professional. Call your health care provider if any unusual symptoms occur within 6 weeks of receiving this vaccine. You may still catch the flu, but the illness is not usually as bad. You cannot get the flu from the vaccine. The vaccine will not protect against colds or other illnesses that may cause fever. The vaccine is needed every year. What side effects may I notice from receiving this medicine? Side effects that you should report to your doctor or health care professional as soon as possible: -allergic reactions like skin rash, itching or hives, swelling of the face, lips, or tongue Side effects that usually do not require medical attention (report to your doctor or health care professional if they continue or are bothersome): -fever -headache -muscle aches and pains -pain, tenderness, redness, or swelling at site where injected -weak or tired This list may not describe all possible side effects. Call your doctor for medical advice about side effects. You may report side effects to FDA at 1-800-FDA-1088. Where should I keep my medicine? This vaccine is only given in a clinic, pharmacy, doctor's office, or other health care setting and will not be stored at home. NOTE: This sheet is a summary. It may not cover all possible information. If you have questions about this medicine, talk  to your doctor, pharmacist, or health care provider.  2018 Elsevier/Gold Standard (2008-02-23 09:30:40)

## 2017-08-28 NOTE — Progress Notes (Signed)
[name], female    DOB: 04-06-1961  MRN: [id number]  CC: Follow-up   Subjective: Jennifer [name] is a 57 y.o. female who presents for f/u. Her concerns today include:  Pt with hx of HTN, tob dep, bipolar ds, anxiety, drug abuse, GERD  1.  Request referral for eye exam.  Has problems reading things close up.  She has tried over-the-counter reading glasses.  She is also wanting to know why her eyes look a bit yellow.  She is conscious about it because she states that people think she looks drunk all the time.  2.  Requests STD check.  Had sex with new partner 2 wks ago.  Did not use condoms. No vaginal dischg or itching No burning with urination  3.  MMG: Requests referral for mammogram.  She checks her breasts intermittently.  No lumps. No fhx of breast cancer.  4 mental health: On last visit I encouraged her to get established with [place] or family services of the [place].  She has not done that.  She feels she is doing okay on Paxil. Patient Active Problem List   Diagnosis Date Noted  . Current smoker 08/12/2016  . Homeless single person 08/12/2016  . Skin lesion of left leg 03/29/2015  . Trigger middle finger of right hand 01/11/2015  . Discoloration of eye 01/11/2015  . Bradycardia 01/11/2015  . Skin lesion of scalp 07/17/2014  . Leg swelling 05/20/2013  . Vasomotor rhinitis 08/17/2012  . Right leg pain 07/26/2012  . Anxiety 02/26/2012  . GERD (gastroesophageal reflux disease) 08/08/2011  . Menopausal symptoms 07/23/2011  . Mastalgia 07/23/2011  . Bipolar disorder, current episode manic without psychotic features, moderate ([place]) 12/05/2010  . 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 SEBACEOUS CYST, SCALP 05/06/2010  . SYNOVIAL CYST 05/03/2010  . Abdominal pain 12/06/2009  . DRUG ABUSE, HX OF 12/06/2009  . Lipoma of shoulder 02/06/2009  . FIBROIDS, UTERUS 02/06/2009  . DRUG ABUSE 02/06/2009  . Essential hypertension, benign 02/06/2009  . PAIN IN  JOINT, SHOULDER REGION 02/06/2009  . Insomnia 02/06/2009  . PELVIC MASS 02/06/2009     Current Outpatient Medications on File Prior to Visit  Medication Sig Dispense Refill  . amLODipine (NORVASC) 5 MG tablet Take 1 tablet (5 mg total) by mouth daily. 90 tablet 3  . lisinopril-hydrochlorothiazide (PRINZIDE,ZESTORETIC) 20-12.5 MG tablet Take 1 tablet by mouth daily. 30 tablet 6  . metoprolol succinate (TOPROL-XL) 50 MG 24 hr tablet Take 1 tablet (50 mg total) by mouth daily. Take with or immediately following a meal. 90 tablet 3  . PARoxetine (PAXIL) 40 MG tablet Take 1 tablet (40 mg total) by mouth daily. 30 tablet 4  . acetaminophen (TYLENOL) 500 MG tablet Take 1 tablet (500 mg total) by mouth every 8 (eight) hours as needed. (Patient not taking: Reported on 08/28/2017) 30 tablet 0  . methocarbamol (ROBAXIN) 500 MG tablet Take 1 tablet (500 mg total) by mouth 2 (two) times daily as needed for muscle spasms. (Patient not taking: Reported on 08/28/2017) 14 tablet 0  . naproxen sodium (ALEVE) 220 MG tablet Take 440 mg 2 (two) times daily by mouth.     No current facility-administered medications on file prior to visit.     No Known Allergies  Social History   Socioeconomic History  . Marital status: Single    Spouse name: Not on file  . Number of children: Not on file  . Years of education: Not on file  .  Highest education level: Not on file  Social Needs  . Financial resource strain: Not on file  . Food insecurity - worry: Not on file  . Food insecurity - inability: Not on file  . Transportation needs - medical: Not on file  . Transportation needs - non-medical: Not on file  Occupational History  . Not on file  Tobacco Use  . Smoking status: Current Every Day Smoker    Packs/day: 0.50    Years: 20.00    Pack years: 10.00    Types: Cigarettes  . Smokeless tobacco: Never Used  . Tobacco comment: Wants to quit.   Substance and Sexual Activity  . Alcohol use: No     Alcohol/week: 0.0 oz  . Drug use: Yes    Types: Cocaine, Marijuana  . Sexual activity: Not on file  Other Topics Concern  . Not on file  Social History Narrative   Financial assistance approved for 100% discount at [facility] and has [facility] card per Bonna Gains   07/19/2010          Family History  Problem Relation Age of Onset  . Hypertension Mother   . Hypertension Sister   . Diabetes Father     Past Surgical History:  Procedure Laterality Date  . ABDOMINAL HYSTERECTOMY     2003  . BILATERAL SALPINGOOPHORECTOMY     01/2010  . DILATION AND CURETTAGE OF UTERUS    . EXPLORATORY LAPAROTOMY     complex pelvic mass 2011  . TONSILLECTOMY      ROS: Review of Systems  PHYSICAL EXAM: BP (!) 146/93 (BP Location: Left Arm, Patient Position: Sitting, Cuff Size: Normal)   Pulse 60   Temp 98.5 F (36.9 C) (Oral)   Resp 16   Ht 5\' 4"  (1.626 m)   Wt 192 lb 9.6 oz (87.4 kg)   LMP 12/04/2009   SpO2 98%   BMI 33.06 kg/m   Wt Readings from Last 3 Encounters:  08/28/17 192 lb 9.6 oz (87.4 kg)  07/22/17 193 lb 6.4 oz (87.7 kg)  05/20/17 181 lb (82.1 kg)  BP 130/90  Physical Exam  General appearance - alert, well appearing, and in no distress Mental status -patient was annoyed and aggravated by her weight time. Eyes -extraocular movement intact.  Muddy sclera.  No icterus.   Chest - clear to auscultation, no wheezes, rales or rhonchi, symmetric air entry Heart - normal rate, regular rhythm, normal S1, S2, no murmurs, rubs, clicks or gallops Musculoskeletal -5-6 cm lipoma over RT anterior shoulder jt Extremities - no LE edema   ASSESSMENT AND PLAN: 1. Screen for STD (sexually transmitted disease) Safe sex practices encourage especially using condoms. - Cervicovaginal ancillary only  2. Breast cancer screening - MM Digital Screening; Future  3. Vision changes Patient uninsured.  Advised her to call any optometry practice for appointment but she will have to pay out of  pocket. -Reassurance given the color of her eyes is just a normal variant.  Patient wanted me to use something to scrape the color so that her eyes would look white.  I told her that we cannot do that.  4. Need for influenza vaccination - Flu Vaccine QUAD 6+ mos PF IM (Fluarix Quad PF)  5. History of bipolar disorder I suspect patient also has schizophrenia.  I have encouraged her again to establish mental health services.  We will have LCSW meet with her today.  Patient was given the opportunity to ask questions.  Patient verbalized  understanding of the plan and was able to repeat key elements of the plan.   Orders Placed This Encounter  Procedures  . MM Digital Screening  . Flu Vaccine QUAD 6+ mos PF IM (Fluarix Quad PF)     Requested Prescriptions    No prescriptions requested or ordered in this encounter    No Follow-up on file.  Karle Plumber, MD, FACP

## 2017-08-28 NOTE — BH Specialist Note (Signed)
Integrated Behavioral Health Initial Visit  MRN: [id number] Name: Jennifer [name]  Number of [place] Clinician visits:: 1/6 Session Start time: 11:30 AM  Session End time: 12:00 PM Total time: 30 minutes  Type of Service: [place] Interpretor:No. Interpretor Name and Language: N/A   Warm Hand Off Completed.       SUBJECTIVE: Jennifer [name] is a 57 y.o. female accompanied by self Patient was referred by Dr. Wynetta Emery for bipolar. Patient reports the following symptoms/concerns: difficulty sleeping, low energy, decreased concentration, difficulty relaxing, and irritability Duration of problem: Ongoing; Severity of problem: moderate  OBJECTIVE: Mood: Anxious and Affect: Appropriate Risk of harm to self or others: No plan to harm self or others  LIFE CONTEXT: Family and Social: Pt receives support from family Engineer, petroleum and Sister) who resides nearby. 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 She states that she has maintained sobriety for a while by praying and strengthening spirituality Life Changes: Pt is managing mental health and sobriety  GOALS ADDRESSED: Patient will: 1. Reduce symptoms of: agitation, anxiety, depression and stress 2. Increase knowledge and/or ability of: coping skills and healthy habits  3. Demonstrate ability to: Increase adequate support systems for patient/family and Decrease self-medicating behaviors  INTERVENTIONS: Interventions utilized: Motivational Interviewing, Supportive Counseling and Link to [employer]  Standardized Assessments completed: GAD-7 and PHQ 2&9  ASSESSMENT: Patient currently experiencing anxiety regarding physical health. She reports difficulty sleeping, low energy, decreased concentration, difficulty relaxing, and irritability. She receives support from  family.   Patient may benefit from psychotherapy. She participates in medication management through PCP. LCSWA commended pt for maintaining her sobriety and applying healthy coping skills to cope with stressors. Pt was strongly encouraged to initiate psychotherapy and supportive resources for substance use to strengthen support system. Pt states that she does not want to participate in therapy or AA/NA services because they do not work for her. States that praying and taking prescribed medication has been working. She is aware of crisis intervention resources. LCSWA discussed importance of scheduling financial counseling appointment to obtain orange card. Dental resources were provided per request.   PLAN: 1. Follow up with behavioral health clinician on : Pt was encouraged to contact LCSWA if symptoms worsen or fail to improve to schedule behavioral appointments at [facility]. 2. Behavioral recommendations: LCSWA recommends that pt apply healthy coping skills discussed. Pt is encouraged to schedule follow up appointment with LCSWA 3. Referral(s): [place] (In Clinic), [place] (LME/Outside Clinic) and Community Resources:  Dental 4. "From scale of 1-10, how likely are you to follow plan?": 10/10  Rebekah Chesterfield, LCSW 09/01/16 5:28 PM

## 2017-08-31 LAB — CERVICOVAGINAL ANCILLARY ONLY
Chlamydia: NEGATIVE
Neisseria Gonorrhea: NEGATIVE
Trichomonas: POSITIVE — AB

## 2017-09-01 ENCOUNTER — Other Ambulatory Visit: Payer: Self-pay | Admitting: Internal Medicine

## 2017-09-01 MED ORDER — METRONIDAZOLE 500 MG PO TABS: 500.0000 mg | ORAL_TABLET | Freq: Two times a day (BID) | ORAL | 0 refills | Status: DC

## 2017-09-01 MED FILL — metroNIDAZOLE 500 MG TABS: 500 | 7 days supply | Qty: 14 | Fill #0

## 2017-09-03 ENCOUNTER — Telehealth: Payer: Self-pay

## 2017-09-03 NOTE — Telephone Encounter (Signed)
Contacted pt to go over lab results pt didn't answer lvm asking pt to give me a call at her earliest convenience   If pt calls back please give results: tested positive for Trichomonas. She and partner need to be treated prior to having unprotected sex again. I have sent rxn to [facility] pharmacy for Flagyl.

## 2017-10-05 MED FILL — METOPROLOL SUCCINATE ER 50: 50 | 30 days supply | Qty: 30 | Fill #2

## 2017-10-05 MED FILL — ?AMLODIPINE BESYLATE 5 MG T: 5 MG | 30 days supply | Qty: 30 | Fill #4

## 2017-10-05 MED FILL — LISINOPRIL-HCTZ 20-12.5 MG: 20-12.5 | 30 days supply | Qty: 30 | Fill #4

## 2017-10-05 MED FILL — ?PAROXETINE HCL 40 MG TABS: 40 | 30 days supply | Qty: 30 | Fill #2

## 2017-10-07 ENCOUNTER — Ambulatory Visit: Payer: Self-pay

## 2017-11-02 MED FILL — LISINOPRIL-HCTZ 20-12.5 MG: 20-12.5 | 30 days supply | Qty: 30 | Fill #5

## 2017-11-02 MED FILL — AMLODIPINE BESYLATE 5 MG TA: 5 | 30 days supply | Qty: 30 | Fill #5

## 2017-11-02 MED FILL — PARoxetine HCL 40 MG TABS: 40 | 30 days supply | Qty: 30 | Fill #3

## 2017-11-02 MED FILL — METOPROLOL SUCCINATE ER 50: 50 | 30 days supply | Qty: 30 | Fill #3

## 2017-11-05 ENCOUNTER — Ambulatory Visit: Payer: Self-pay

## 2017-12-04 ENCOUNTER — Other Ambulatory Visit: Payer: Self-pay | Admitting: Obstetrics and Gynecology

## 2017-12-04 DIAGNOSIS — Z1231 Encounter for screening mammogram for malignant neoplasm of breast: Secondary | ICD-10-CM

## 2017-12-11 ENCOUNTER — Encounter (HOSPITAL_COMMUNITY): Payer: Self-pay

## 2017-12-11 ENCOUNTER — Emergency Department (HOSPITAL_COMMUNITY)
Admission: EM | Admit: 2017-12-11 | Discharge: 2017-12-11 | Discharge status: Against Medical Advice | Payer: Self-pay | Attending: Emergency Medicine | Admitting: Emergency Medicine

## 2017-12-11 ENCOUNTER — Other Ambulatory Visit: Payer: Self-pay

## 2017-12-11 DIAGNOSIS — Z5321 Procedure and treatment not carried out due to patient leaving prior to being seen by health care provider: Secondary | ICD-10-CM | POA: Insufficient documentation

## 2017-12-11 NOTE — ED Triage Notes (Addendum)
Patient c/o headache x 2 weeks. Patient states, "I don't know if it's from my dental issues or my blood pressure. Patient states she has been out of her BP meds x 4 days.

## 2017-12-11 NOTE — ED Notes (Signed)
Pt called for room x2 

## 2017-12-14 MED FILL — AMLODIPINE BESYLATE 5 MG TA: 5 | 30 days supply | Qty: 30 | Fill #6

## 2017-12-14 MED FILL — LISINOPRIL-HCTZ 20-12.5 MG: 20-12.5 | 30 days supply | Qty: 30 | Fill #6

## 2017-12-14 MED FILL — METOPROLOL SUCCINATE ER 50: 50 | 30 days supply | Qty: 30 | Fill #4

## 2017-12-31 ENCOUNTER — Ambulatory Visit (HOSPITAL_COMMUNITY): Payer: Self-pay

## 2018-01-11 ENCOUNTER — Other Ambulatory Visit: Payer: Self-pay | Admitting: Internal Medicine

## 2018-01-11 DIAGNOSIS — I10 Essential (primary) hypertension: Secondary | ICD-10-CM

## 2018-01-18 ENCOUNTER — Other Ambulatory Visit: Payer: Self-pay

## 2018-01-18 ENCOUNTER — Emergency Department (HOSPITAL_COMMUNITY)
Admission: EM | Admit: 2018-01-18 | Discharge: 2018-01-18 | Disposition: A | Discharge status: Home / Self Care | Payer: Self-pay | Attending: Emergency Medicine | Admitting: Emergency Medicine

## 2018-01-18 ENCOUNTER — Encounter (HOSPITAL_COMMUNITY): Payer: Self-pay

## 2018-01-18 DIAGNOSIS — Z5321 Procedure and treatment not carried out due to patient leaving prior to being seen by health care provider: Secondary | ICD-10-CM | POA: Insufficient documentation

## 2018-01-18 DIAGNOSIS — R197 Diarrhea, unspecified: Secondary | ICD-10-CM | POA: Insufficient documentation

## 2018-01-18 NOTE — ED Triage Notes (Signed)
Patient states she took Pepto Bismol and diarrhea stopped for a day and patient had a normal BM. Patient states she is currently having diarrhea again.

## 2018-01-18 NOTE — ED Triage Notes (Signed)
Per EMS-states complaining of diarrhea and B/L leg pain when ambulating-states patient was very demanding, agitated and verbally aggressive when EMS arrived as well as during transport-symptoms started 2-3 days ago

## 2018-01-21 ENCOUNTER — Ambulatory Visit: Payer: Self-pay | Admitting: Internal Medicine

## 2018-01-22 MED FILL — LISINOPRIL-HCTZ 20-12.5 MG: 20-12.5 | 30 days supply | Qty: 30 | Fill #0

## 2018-01-25 MED FILL — AMLODIPINE BESYLATE 5 MG TA: 5 | 30 days supply | Qty: 30 | Fill #7

## 2018-01-25 MED FILL — METOPROLOL SUCCINATE ER 50: 50 | 30 days supply | Qty: 30 | Fill #5

## 2018-01-27 ENCOUNTER — Other Ambulatory Visit: Payer: Self-pay | Admitting: Family Medicine

## 2018-01-27 ENCOUNTER — Encounter: Payer: Self-pay | Admitting: Family Medicine

## 2018-01-27 ENCOUNTER — Ambulatory Visit: Payer: Self-pay | Attending: Family Medicine | Admitting: Family Medicine

## 2018-01-27 VITALS — BP 118/73 | HR 88 | Temp 98.5°F | Ht 64.0 in | Wt 196.2 lb

## 2018-01-27 DIAGNOSIS — M62838 Other muscle spasm: Secondary | ICD-10-CM

## 2018-01-27 DIAGNOSIS — Z79899 Other long term (current) drug therapy: Secondary | ICD-10-CM | POA: Insufficient documentation

## 2018-01-27 DIAGNOSIS — I10 Essential (primary) hypertension: Secondary | ICD-10-CM | POA: Insufficient documentation

## 2018-01-27 DIAGNOSIS — R197 Diarrhea, unspecified: Secondary | ICD-10-CM

## 2018-01-27 DIAGNOSIS — F319 Bipolar disorder, unspecified: Secondary | ICD-10-CM | POA: Insufficient documentation

## 2018-01-27 DIAGNOSIS — R140 Abdominal distension (gaseous): Secondary | ICD-10-CM

## 2018-01-27 MED ORDER — METHOCARBAMOL 500 MG PO TABS: 500.0000 mg | ORAL_TABLET | Freq: Three times a day (TID) | ORAL | 0 refills | Status: DC | PRN

## 2018-01-27 MED ORDER — DIPHENOXYLATE-ATROPINE 2.5-0.025 MG PO TABS: 1.0000 | ORAL_TABLET | Freq: Four times a day (QID) | ORAL | 0 refills | Status: DC | PRN

## 2018-01-27 MED ORDER — KETOROLAC TROMETHAMINE 60 MG/2ML IM SOLN
60.0000 mg | Freq: Once | INTRAMUSCULAR | Status: AC
  Administered 2018-01-27: 60 mg via INTRAMUSCULAR

## 2018-01-27 MED FILL — METHOCARBAMOL 500 MG TABLET: 500 | 10 days supply | Qty: 30 | Fill #0

## 2018-01-27 NOTE — Patient Instructions (Signed)
Diarrhea, Adult °Diarrhea is when you have loose and water poop (stool) often. Diarrhea can make you feel weak and cause you to get dehydrated. Dehydration can make you tired and thirsty, make you have a dry mouth, and make it so you pee (urinate) less often. Diarrhea often lasts 2-3 days. However, it can last longer if it is a sign of something more serious. It is important to treat your diarrhea as told by your doctor. °Follow these instructions at home: °Eating and drinking ° °Follow these recommendations as told by your doctor: °· Take an oral rehydration solution (ORS). This is a drink that is sold at pharmacies and stores. °· Drink clear fluids, such as: °? Water. °? Ice chips. °? Diluted fruit juice. °? Low-calorie sports drinks. °· Eat bland, easy-to-digest foods in small amounts as you are able. These foods include: °? Bananas. °? Applesauce. °? Rice. °? Low-fat (lean) meats. °? Toast. °? Crackers. °· Avoid drinking fluids that have a lot of sugar or caffeine in them. °· Avoid alcohol. °· Avoid spicy or fatty foods. ° °General instructions ° °· Drink enough fluid to keep your pee (urine) clear or pale yellow. °· Wash your hands often. If you cannot use soap and water, use hand sanitizer. °· Make sure that all people in your home wash their hands well and often. °· Take over-the-counter and prescription medicines only as told by your doctor. °· Rest at home while you get better. °· Watch your condition for any changes. °· Take a warm bath to help with any burning or pain from having diarrhea. °· Keep all follow-up visits as told by your doctor. This is important. °Contact a doctor if: °· You have a fever. °· Your diarrhea gets worse. °· You have new symptoms. °· You cannot keep fluids down. °· You feel light-headed or dizzy. °· You have a headache. °· You have muscle cramps. °Get help right away if: °· You have chest pain. °· You feel very weak or you pass out (faint). °· You have bloody or black poop or  poop that look like tar. °· You have very bad pain, cramping, or bloating in your belly (abdomen). °· You have trouble breathing or you are breathing very quickly. °· Your heart is beating very quickly. °· Your skin feels cold and clammy. °· You feel confused. °· You have signs of dehydration, such as: °? Dark pee, hardly any pee, or no pee. °? Cracked lips. °? Dry mouth. °? Sunken eyes. °? Sleepiness. °? Weakness. °This information is not intended to replace advice given to you by your health care provider. Make sure you discuss any questions you have with your health care provider. °Document Released: 01/14/2008 Document Revised: 02/15/2016 Document Reviewed: 04/03/2015 °Elsevier Interactive Patient Education © 2018 Elsevier Inc. ° °

## 2018-01-27 NOTE — Progress Notes (Signed)
Subjective:  [name], female    DOB: 1961/04/28  Age: 57 y.o. MRN: [id number]  CC: Shortness of Breath and Diarrhea   HPI Jennifer [name] is a 57 year old female with history of hypertension, depression who presents today for an acute visit complaining of diarrhea which has been intermittent for the last 3 weeks but has been persistent in the last 4 days watery bowel movements with associated nausea and vomiting.  She denies abdominal cramping and denies the presence of blood in stools.  She denies ingesting fast foods recently.  Endorses abdominal bloating and also has excessive burping and abdominal bloating.  Whenever she drinks liquids she finds it difficult to swallow and describes this as the drink sitting in her chest.  She also states her "food takes a long time to go down" and this interferes with her breathing.  I have watched him in the exam room after drinking water and she has been burping a lot and struggling to swallow her drink but after 1 to 2 minutes her symptoms subside and she is back to normal. She also complains of back pain across her lower back which radiates anteriorly to her lower abdomen but denies numbness in lower extremities no radiation of pain down her legs.  Past Medical History:  Diagnosis Date  . bipolar   . Bipolar 1 disorder ([place])   . Depression   . Fibroids    now s/p hysterectomy  . Hypertension   . Left ventricular hypertrophy   . Polysubstance abuse ([place])    IV drug [place], cocaine on occassion, smoking and alcoholism, has been going to AA, not completely sober    Past Surgical History:  Procedure Laterality Date  . ABDOMINAL HYSTERECTOMY     2003  . BILATERAL SALPINGOOPHORECTOMY     01/2010  . DILATION AND CURETTAGE OF UTERUS    . EXPLORATORY LAPAROTOMY     complex pelvic mass 2011  . TONSILLECTOMY      No Known Allergies  Outpatient Medications Prior to Visit  Medication Sig Dispense Refill  . amLODipine (NORVASC) 5  MG tablet Take 1 tablet (5 mg total) by mouth daily. 90 tablet 3  . lisinopril-hydrochlorothiazide (PRINZIDE,ZESTORETIC) 20-12.5 MG tablet TAKE 1 TABLET BY MOUTH DAILY. 30 tablet 1  . metoprolol succinate (TOPROL-XL) 50 MG 24 hr tablet Take 1 tablet (50 mg total) by mouth daily. Take with or immediately following a meal. 90 tablet 3  . PARoxetine (PAXIL) 40 MG tablet Take 1 tablet (40 mg total) by mouth daily. 30 tablet 4  . acetaminophen (TYLENOL) 500 MG tablet Take 1 tablet (500 mg total) by mouth every 8 (eight) hours as needed. (Patient not taking: Reported on 08/28/2017) 30 tablet 0  . metroNIDAZOLE (FLAGYL) 500 MG tablet Take 1 tablet (500 mg total) by mouth 2 (two) times daily. (Patient not taking: Reported on 01/27/2018) 14 tablet 0  . naproxen sodium (ALEVE) 220 MG tablet Take 440 mg 2 (two) times daily by mouth.    . methocarbamol (ROBAXIN) 500 MG tablet Take 1 tablet (500 mg total) by mouth 2 (two) times daily as needed for muscle spasms. (Patient not taking: Reported on 08/28/2017) 14 tablet 0   No facility-administered medications prior to visit.     ROS Review of Systems  Constitutional: Negative for activity change, appetite change and fatigue.  HENT: Negative for congestion, sinus pressure and sore throat.   Eyes: Negative for visual disturbance.  Respiratory: Negative for cough, chest  tightness, shortness of breath and wheezing.   Cardiovascular: Negative for chest pain and palpitations.  Gastrointestinal:       See hpi  Endocrine: Negative for polydipsia.  Genitourinary: Negative for dysuria and frequency.  Musculoskeletal: Negative for arthralgias and back pain.  Skin: Negative for rash.  Neurological: Negative for tremors, light-headedness and numbness.  Hematological: Does not bruise/bleed easily.  Psychiatric/Behavioral: Negative for agitation and behavioral problems.    Objective:  BP 118/73   Pulse 88   Temp 98.5 F (36.9 C) (Oral)   Ht 5\' 4"  (1.626 m)   Wt  196 lb 3.2 oz (89 kg)   LMP 12/04/2009   SpO2 97%   BMI 33.68 kg/m   BP/Weight 01/27/2018 9/48/5462 7/0/3500  Systolic BP 938 182 993  Diastolic BP 73 76 716  Wt. (Lbs) 196.2 195.25 200  BMI 33.68 33.51 34.33      Physical Exam  Constitutional: She is oriented to person, place, and time. She appears well-developed and well-nourished.  Cardiovascular: Normal rate, normal heart sounds and intact distal pulses.  No murmur heard. Pulmonary/Chest: Effort normal and breath sounds normal. She has no wheezes. She has no rales. She exhibits tenderness.  Abdominal: Soft. Bowel sounds are normal. She exhibits no distension and no mass. There is no tenderness.  Musculoskeletal: Normal range of motion.  TTP across lower back; negative straight leg raise bilaterally  Neurological: She is alert and oriented to person, place, and time.  Skin: Skin is warm.     Assessment & Plan:   1. Muscle spasm - methocarbamol (ROBAXIN) 500 MG tablet; Take 1 tablet (500 mg total) by mouth every 8 (eight) hours as needed for muscle spasms.  Dispense: 90 tablet; Refill: 0 - ketorolac (TORADOL) injection 60 mg  2. Abdominal bloating - H. pylori breath test  3. Diarrhea, unspecified type Placed on Lomotil   Meds ordered this encounter  Medications  . methocarbamol (ROBAXIN) 500 MG tablet    Sig: Take 1 tablet (500 mg total) by mouth every 8 (eight) hours as needed for muscle spasms.    Dispense:  90 tablet    Refill:  0  . diphenoxylate-atropine (LOMOTIL) 2.5-0.025 MG tablet    Sig: Take 1 tablet by mouth 4 (four) times daily as needed for diarrhea or loose stools.    Dispense:  30 tablet    Refill:  0  . ketorolac (TORADOL) injection 60 mg    Follow-up: Return in about 1 month (around 02/26/2018) for follow up of abdominal symptoms.   Charlott Rakes MD

## 2018-01-29 LAB — H. PYLORI BREATH TEST: H pylori Breath Test: NEGATIVE

## 2018-02-01 ENCOUNTER — Telehealth: Payer: Self-pay

## 2018-02-01 NOTE — Telephone Encounter (Signed)
-----   Message from Charlott Rakes, MD sent at 01/29/2018  2:36 PM EDT ----- H. pylori test is negative

## 2018-02-01 NOTE — Telephone Encounter (Signed)
Patient was called and informed of lab results. 

## 2018-02-03 ENCOUNTER — Ambulatory Visit: Payer: Self-pay | Attending: Family Medicine | Admitting: Physician Assistant

## 2018-02-03 VITALS — BP 135/86 | HR 64 | Temp 99.0°F | Resp 18 | Ht 64.0 in | Wt 201.0 lb

## 2018-02-03 DIAGNOSIS — R194 Change in bowel habit: Secondary | ICD-10-CM

## 2018-02-03 DIAGNOSIS — R140 Abdominal distension (gaseous): Secondary | ICD-10-CM | POA: Insufficient documentation

## 2018-02-03 DIAGNOSIS — R1011 Right upper quadrant pain: Secondary | ICD-10-CM | POA: Insufficient documentation

## 2018-02-03 DIAGNOSIS — Z90710 Acquired absence of both cervix and uterus: Secondary | ICD-10-CM | POA: Insufficient documentation

## 2018-02-03 DIAGNOSIS — R197 Diarrhea, unspecified: Secondary | ICD-10-CM | POA: Insufficient documentation

## 2018-02-03 DIAGNOSIS — F319 Bipolar disorder, unspecified: Secondary | ICD-10-CM | POA: Insufficient documentation

## 2018-02-03 DIAGNOSIS — Z79899 Other long term (current) drug therapy: Secondary | ICD-10-CM | POA: Insufficient documentation

## 2018-02-03 DIAGNOSIS — F1020 Alcohol dependence, uncomplicated: Secondary | ICD-10-CM | POA: Insufficient documentation

## 2018-02-03 DIAGNOSIS — I10 Essential (primary) hypertension: Secondary | ICD-10-CM | POA: Insufficient documentation

## 2018-02-03 MED ORDER — DIPHENOXYLATE-ATROPINE 2.5-0.025 MG PO TABS: 1.0000 | ORAL_TABLET | Freq: Four times a day (QID) | ORAL | 0 refills | Status: DC | PRN

## 2018-02-03 NOTE — Progress Notes (Signed)
Jennifer [name], is a 58 y.o. female  ONG:[id number]  GMW:[id number]  DOB - Jan 02, 1961  Subjective:  Chief Complaint and HPI: Jennifer [name] is a 57 y.o. female here today with continued problem of loose BMs.  See last note.  She is continuing to have bloating, loose BMs, and some nausea with poor appetite now for about 4 weeks.  H. Pylori testing was negative.  Occasional RUQ abdominal pain and "feels like a sheet of cardboard" is over that area of her body.  Denies urinary s/sx.  Still drinking alcohol.  Wont be forthcoming about amount-says  Drank one the other day and upon further questioning it was a 40 ounce beer.  Says she isn't drinking much but has long h/o alcoholism.  No f/c.  Says diarrhea/loose stools about 4 times a day.  Sometimes with difficulty swallowing.  No melena/hematochezia.  No recent travel.    ROS:   Constitutional:  No f/c, No night sweats, No unexplained weight loss. EENT:  No vision changes, No blurry vision, No hearing changes. No mouth, throat, or ear problems.  Respiratory: No cough, No SOB Cardiac: No CP, no palpitations GI:  some abd pain, some N/V/D. GU: No Urinary s/sx Musculoskeletal: No joint pain Neuro: No headache, no dizziness, no motor weakness.  Skin: No rash Endocrine:  No polydipsia. No polyuria.  Psych: Denies SI/HI  No problems updated.  ALLERGIES: No Known Allergies  PAST MEDICAL HISTORY: Past Medical History:  Diagnosis Date  . bipolar   . Bipolar 1 disorder ([place])   . Depression   . Fibroids    now s/p hysterectomy  . Hypertension   . Left ventricular hypertrophy   . Polysubstance abuse ([place])    IV drug [place], cocaine on occassion, smoking and alcoholism, has been going to [place], not completely sober    MEDICATIONS AT HOME: Prior to Admission medications   Medication Sig Start Date End Date Taking? Authorizing Provider  acetaminophen (TYLENOL) 500 MG tablet Take 1 tablet (500 mg total) by mouth every 8 (eight) hours as  needed. Patient not taking: Reported on 08/28/2017 07/22/17   Ladell Pier, MD  amLODipine (NORVASC) 5 MG tablet Take 1 tablet (5 mg total) by mouth daily. 05/15/17   Ladell Pier, MD  diphenoxylate-atropine (LOMOTIL) 2.5-0.025 MG tablet Take 1 tablet by mouth 4 (four) times daily as needed for diarrhea or loose stools. 02/03/18   Argentina Donovan, PA-C  lisinopril-hydrochlorothiazide (PRINZIDE,ZESTORETIC) 20-12.5 MG tablet TAKE 1 TABLET BY MOUTH DAILY. 01/11/18   Ladell Pier, MD  methocarbamol (ROBAXIN) 500 MG tablet Take 1 tablet (500 mg total) by mouth every 8 (eight) hours as needed for muscle spasms. 01/27/18   Charlott Rakes, MD  metoprolol succinate (TOPROL-XL) 50 MG 24 hr tablet Take 1 tablet (50 mg total) by mouth daily. Take with or immediately following a meal. 05/15/17   Ladell Pier, MD  naproxen sodium (ALEVE) 220 MG tablet Take 440 mg 2 (two) times daily by mouth.    [provider]  PARoxetine (PAXIL) 40 MG tablet Take 1 tablet (40 mg total) by mouth daily. 07/22/17   Ladell Pier, MD     Objective:  EXAM:   Vitals:   02/03/18 1205  BP: 135/86  Pulse: 64  Resp: 18  Temp: 99 F (37.2 C)  TempSrc: Oral  SpO2: 98%  Weight: 201 lb (91.2 kg)  Height: 5\' 4"  (1.626 m)    General appearance : A&OX3. NAD. Non-toxic-appearing  HEENT: Atraumatic and Normocephalic.  PERRLA. EOM intact.  Neck: supple, no JVD. No cervical lymphadenopathy. No thyromegaly Chest/Lungs:  Breathing-non-labored, Good air entry bilaterally, breath sounds normal without rales, rhonchi, or wheezing  CVS: S1 S2 regular, no murmurs, gallops, rubs  Abdomen: Bowel sounds present, Non tender and not distended with no gaurding, rigidity or rebound. Extremities: Bilateral Lower Ext shows no edema, both legs are warm to touch with = pulse throughout Neurology:  CN II-XII grossly intact, Non focal.   Psych:  TP linear. J/I fair. Tangential and at times interrupts speech.  Appropriate eye contact and affect.  Skin:  No Rash  Data Review Lab Results  Component Value Date   HGBA1C 5.8 02/06/2009     Assessment & Plan   1. Change in bowel habits Non-acute abdomen currently - Ambulatory referral to Gastroenterology - Comprehensive metabolic panel - CBC with Differential/Platelet  2. Diarrhea, unspecified type RF Lomotil bc it was helping some.   - Comprehensive metabolic panel  3. Abdominal bloating Non-acute abdomen counseled again on alcohol illicit substance cessation and advised 12 step recovery meetings.   Patient have been counseled extensively about nutrition and exercise  Return in about 1 month (around 03/05/2018) for [place] for f/up abdominal issues.  The patient was given clear instructions to go to ER or return to medical center if symptoms don't improve, worsen or new problems develop. The patient verbalized understanding. The patient was told to call to get lab results if they haven't heard anything in the next week.     Freeman Caldron, PA-C [facility], [place]   02/03/2018, 12:13 PM

## 2018-02-15 ENCOUNTER — Emergency Department (HOSPITAL_COMMUNITY)
Admission: EM | Admit: 2018-02-15 | Discharge: 2018-02-15 | Disposition: A | Discharge status: Home / Self Care | Payer: Self-pay | Attending: Emergency Medicine | Admitting: Emergency Medicine

## 2018-02-15 ENCOUNTER — Emergency Department (HOSPITAL_COMMUNITY): Payer: Self-pay

## 2018-02-15 ENCOUNTER — Encounter (HOSPITAL_COMMUNITY): Payer: Self-pay | Admitting: Emergency Medicine

## 2018-02-15 ENCOUNTER — Other Ambulatory Visit: Payer: Self-pay

## 2018-02-15 DIAGNOSIS — R109 Unspecified abdominal pain: Secondary | ICD-10-CM | POA: Insufficient documentation

## 2018-02-15 DIAGNOSIS — R197 Diarrhea, unspecified: Secondary | ICD-10-CM | POA: Insufficient documentation

## 2018-02-15 DIAGNOSIS — F17210 Nicotine dependence, cigarettes, uncomplicated: Secondary | ICD-10-CM | POA: Insufficient documentation

## 2018-02-15 DIAGNOSIS — R112 Nausea with vomiting, unspecified: Secondary | ICD-10-CM | POA: Insufficient documentation

## 2018-02-15 DIAGNOSIS — I10 Essential (primary) hypertension: Secondary | ICD-10-CM | POA: Insufficient documentation

## 2018-02-15 DIAGNOSIS — Z79899 Other long term (current) drug therapy: Secondary | ICD-10-CM | POA: Insufficient documentation

## 2018-02-15 LAB — CBC
HCT: 48.3 % — ABNORMAL HIGH (ref 36.0–46.0)
Hemoglobin: 15.2 g/dL — ABNORMAL HIGH (ref 12.0–15.0)
MCH: 28.7 pg (ref 26.0–34.0)
MCHC: 31.5 g/dL (ref 30.0–36.0)
MCV: 91.3 fL (ref 78.0–100.0)
PLATELETS: 325 10*3/uL (ref 150–400)
RBC: 5.29 MIL/uL — ABNORMAL HIGH (ref 3.87–5.11)
RDW: 16.4 % — AB (ref 11.5–15.5)
WBC: 7.9 10*3/uL (ref 4.0–10.5)

## 2018-02-15 LAB — COMPREHENSIVE METABOLIC PANEL
ALBUMIN: 3.5 g/dL (ref 3.5–5.0)
ALK PHOS: 59 U/L (ref 38–126)
ALT: 25 U/L (ref 0–44)
AST: 19 U/L (ref 15–41)
Anion gap: 6 (ref 5–15)
BILIRUBIN TOTAL: 0.5 mg/dL (ref 0.3–1.2)
BUN: 10 mg/dL (ref 6–20)
CALCIUM: 9.2 mg/dL (ref 8.9–10.3)
CO2: 26 mmol/L (ref 22–32)
CREATININE: 0.81 mg/dL (ref 0.44–1.00)
Chloride: 111 mmol/L (ref 98–111)
GFR calc Af Amer: >60 mL/min (ref >60)
GFR calc non Af Amer: >60 mL/min (ref >60)
GLUCOSE: 94 mg/dL (ref 70–99)
Potassium: 4.2 mmol/L (ref 3.5–5.1)
Sodium: 143 mmol/L (ref 135–145)
Total Protein: 6.5 g/dL (ref 6.5–8.1)

## 2018-02-15 LAB — I-STAT BETA HCG BLOOD, ED (MC, WL, AP ONLY)

## 2018-02-15 LAB — LIPASE, BLOOD: Lipase: 39 U/L (ref 11–51)

## 2018-02-15 MED ORDER — DICYCLOMINE HCL 10 MG PO CAPS
10.0000 mg | ORAL_CAPSULE | Freq: Once | ORAL | Status: AC
  Administered 2018-02-15: 10 mg via ORAL
  Filled 2018-02-15: qty 1

## 2018-02-15 MED ORDER — DICYCLOMINE HCL 20 MG PO TABS: 20.0000 mg | ORAL_TABLET | Freq: Two times a day (BID) | ORAL | 0 refills | Status: DC

## 2018-02-15 MED ORDER — METOCLOPRAMIDE HCL 10 MG PO TABS: 10.0000 mg | ORAL_TABLET | Freq: Three times a day (TID) | ORAL | 0 refills | Status: DC | PRN

## 2018-02-15 MED ORDER — MORPHINE SULFATE (PF) 4 MG/ML IV SOLN
4.0000 mg | Freq: Once | INTRAVENOUS | Status: AC
  Administered 2018-02-15: 4 mg via INTRAVENOUS
  Filled 2018-02-15: qty 1

## 2018-02-15 MED ORDER — ONDANSETRON 8 MG PO TBDP: 8.0000 mg | ORAL_TABLET | Freq: Three times a day (TID) | ORAL | 0 refills | Status: DC | PRN

## 2018-02-15 MED ORDER — ONDANSETRON HCL 4 MG/2ML IJ SOLN
4.0000 mg | Freq: Once | INTRAMUSCULAR | Status: AC
  Administered 2018-02-15: 4 mg via INTRAVENOUS
  Filled 2018-02-15: qty 2

## 2018-02-15 MED ORDER — SODIUM CHLORIDE 0.9 % IV BOLUS
1000.0000 mL | Freq: Once | INTRAVENOUS | Status: AC
  Administered 2018-02-15: 1000 mL via INTRAVENOUS

## 2018-02-15 MED ORDER — IOHEXOL 300 MG/ML  SOLN
100.0000 mL | Freq: Once | INTRAMUSCULAR | Status: AC | PRN
  Administered 2018-02-15: 100 mL via INTRAVENOUS

## 2018-02-15 MED ORDER — KETOROLAC TROMETHAMINE 30 MG/ML IJ SOLN
30.0000 mg | Freq: Once | INTRAMUSCULAR | Status: AC
  Administered 2018-02-15: 30 mg via INTRAVENOUS
  Filled 2018-02-15: qty 1

## 2018-02-15 NOTE — ED Notes (Signed)
Transported to CT scan via stretcher per rad tech  

## 2018-02-15 NOTE — ED Provider Notes (Signed)
[facility] EMERGENCY DEPARTMENT Provider Note   CSN: [id number] Arrival date & time: 02/15/18  1751     History   Chief Complaint Chief Complaint  Patient presents with  . Abdominal Pain    HPI Jennifer [name] is a 57 y.o. female.  HPI Patient is a 57 year old female with a history of bipolar disorder and polysubstance abuse who presents the emergency department with 6 to 8 weeks of abdominal distention and pain with associated nausea vomiting and diarrhea.  Patient has tried Pepto-Bismol without significant relief.   No blood in her stool.  No blood in her vomit.  No history of similar symptoms.  No fevers at home.  No urinary symptoms   Past Medical History:  Diagnosis Date  . bipolar   . Bipolar 1 disorder ([place])   . Depression   . Fibroids    now s/p hysterectomy  . Hypertension   . Left ventricular hypertrophy   . Polysubstance abuse ([place])    IV drug [place], cocaine on occassion, smoking and alcoholism, has been going to AA, not completely sober    Patient Active Problem List   Diagnosis Date Noted  . Current smoker 08/12/2016  . Homeless single person 08/12/2016  . Skin lesion of left leg 03/29/2015  . Trigger middle finger of right hand 01/11/2015  . Discoloration of eye 01/11/2015  . Bradycardia 01/11/2015  . Skin lesion of scalp 07/17/2014  . Leg swelling 05/20/2013  . Vasomotor rhinitis 08/17/2012  . Right leg pain 07/26/2012  . Anxiety 02/26/2012  . GERD (gastroesophageal reflux disease) 08/08/2011  . Menopausal symptoms 07/23/2011  . Mastalgia 07/23/2011  . Bipolar disorder, current episode manic without psychotic features, moderate ([place]) 12/05/2010  . 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 SEBACEOUS CYST, SCALP 05/06/2010  . SYNOVIAL CYST 05/03/2010  . Abdominal pain 12/06/2009  . DRUG ABUSE, HX OF 12/06/2009  . Lipoma of shoulder 02/06/2009  . FIBROIDS, UTERUS 02/06/2009  . DRUG ABUSE 02/06/2009  . Essential hypertension, benign  02/06/2009  . PAIN IN JOINT, SHOULDER REGION 02/06/2009  . Insomnia 02/06/2009  . PELVIC MASS 02/06/2009    Past Surgical History:  Procedure Laterality Date  . ABDOMINAL HYSTERECTOMY     2003  . BILATERAL SALPINGOOPHORECTOMY     01/2010  . DILATION AND CURETTAGE OF UTERUS    . EXPLORATORY LAPAROTOMY     complex pelvic mass 2011  . TONSILLECTOMY       OB History    Gravida  5   Para  0   Term  0   Preterm  0   AB  5   Living  0     SAB  2   TAB  1   Ectopic  0   Multiple  0   Live Births               Home Medications    Prior to Admission medications   Medication Sig Start Date End Date Taking? Authorizing Provider  amLODipine (NORVASC) 5 MG tablet Take 1 tablet (5 mg total) by mouth daily. 05/15/17  Yes Ladell Pier, MD  diphenoxylate-atropine (LOMOTIL) 2.5-0.025 MG tablet Take 1 tablet by mouth 4 (four) times daily as needed for diarrhea or loose stools. 02/03/18  Yes McClung, Angela M, PA-C  lisinopril-hydrochlorothiazide (PRINZIDE,ZESTORETIC) 20-12.5 MG tablet TAKE 1 TABLET BY MOUTH DAILY. 01/11/18  Yes Ladell Pier, MD  methocarbamol (ROBAXIN) 500 MG tablet Take 1 tablet (500 mg total) by mouth every  8 (eight) hours as needed for muscle spasms. 01/27/18  Yes Charlott Rakes, MD  metoprolol succinate (TOPROL-XL) 50 MG 24 hr tablet Take 1 tablet (50 mg total) by mouth daily. Take with or immediately following a meal. 05/15/17  Yes Ladell Pier, MD  naproxen sodium (ALEVE) 220 MG tablet Take 440 mg by mouth 2 (two) times daily as needed (pain).    Yes [provider]  PARoxetine (PAXIL) 40 MG tablet Take 1 tablet (40 mg total) by mouth daily. 07/22/17  Yes Ladell Pier, MD  acetaminophen (TYLENOL) 500 MG tablet Take 1 tablet (500 mg total) by mouth every 8 (eight) hours as needed. Patient not taking: Reported on 08/28/2017 07/22/17   Ladell Pier, MD  dicyclomine (BENTYL) 20 MG tablet Take 1 tablet (20 mg total) by mouth 2  (two) times daily. 02/15/18   Jola Schmidt, MD  metoCLOPramide (REGLAN) 10 MG tablet Take 1 tablet (10 mg total) by mouth every 8 (eight) hours as needed for nausea. 02/15/18   Jola Schmidt, MD  ondansetron (ZOFRAN ODT) 8 MG disintegrating tablet Take 1 tablet (8 mg total) by mouth every 8 (eight) hours as needed for nausea or vomiting. 02/15/18   Jola Schmidt, MD    Family History Family History  Problem Relation Age of Onset  . Hypertension Mother   . Hypertension Sister   . Diabetes Father     Social History Social History   Tobacco Use  . Smoking status: Current Every Day Smoker    Packs/day: 0.50    Years: 20.00    Pack years: 10.00    Types: Cigarettes  . Smokeless tobacco: Never Used  . Tobacco comment: Wants to quit.   Substance Use Topics  . Alcohol use: No    Alcohol/week: 0.0 oz  . Drug use: Not Currently    Types: Cocaine, Marijuana     Allergies   Patient has no known allergies.   Review of Systems Review of Systems  All other systems reviewed and are negative.    Physical Exam Updated Vital Signs BP (!) 143/107 (BP Location: Right Arm)   Pulse 71   Temp 98.4 F (36.9 C) (Oral)   Resp 18   Ht 5\' 4"  (1.626 m)   Wt 90.7 kg (200 lb)   LMP 12/04/2009   SpO2 98%   BMI 34.33 kg/m   Physical Exam  Constitutional: She is oriented to person, place, and time. She appears well-developed and well-nourished. No distress.  HENT:  Head: Normocephalic and atraumatic.  Eyes: EOM are normal.  Neck: Normal range of motion.  Cardiovascular: Normal rate, regular rhythm and normal heart sounds.  Pulmonary/Chest: Effort normal and breath sounds normal.  Abdominal: Soft.  Mild distention.  Mild generalized tenderness  Musculoskeletal: Normal range of motion.  Neurological: She is alert and oriented to person, place, and time.  Skin: Skin is warm and dry.  Psychiatric: She has a normal mood and affect. Judgment normal.  Nursing note and vitals  reviewed.    ED Treatments / Results  Labs (all labs ordered are listed, but only abnormal results are displayed) Labs Reviewed  CBC - Abnormal; Notable for the following components:      Result Value   RBC 5.29 (*)    Hemoglobin 15.2 (*)    HCT 48.3 (*)    RDW 16.4 (*)    All other components within normal limits  LIPASE, BLOOD  COMPREHENSIVE METABOLIC PANEL  URINALYSIS, ROUTINE W REFLEX MICROSCOPIC  I-STAT BETA HCG BLOOD, ED ([facility], [facility], [facility] ONLY)    EKG None  Radiology Dg Chest 2 View  Result Date: 02/15/2018 CLINICAL DATA:  57 year old with acute onset of chest pain and anxiety. Current smoker. EXAM: CHEST - 2 VIEW COMPARISON:  05/20/2017, 06/12/2016 and earlier. FINDINGS: Cardiac silhouette upper normal in size to slightly enlarged, unchanged. Thoracic aorta mildly tortuous, unchanged. Hilar and mediastinal contours otherwise unremarkable. Stable minimal linear scarring in the lingula. Lungs otherwise clear. No localized airspace consolidation. Bronchovascular markings normal. No pleural effusions. No pneumothorax. Normal pulmonary vascularity. Visualized bony thorax intact. IMPRESSION: Stable borderline heart size.  No acute cardiopulmonary disease. Electronically Signed   By: Evangeline Dakin M.D.   On: 02/15/2018 11:15   Ct Abdomen Pelvis W Contrast  Result Date: 02/15/2018 CLINICAL DATA:  Abdominal pain and distention for 1 month.  ADC EXAM: CT ABDOMEN AND PELVIS WITH CONTRAST TECHNIQUE: Multidetector CT imaging of the abdomen and pelvis was performed using the standard protocol following bolus administration of intravenous contrast. CONTRAST:  171mL OMNIPAQUE IOHEXOL 300 MG/ML  SOLN COMPARISON:  CT abdomen and pelvis 12/16/2010. FINDINGS: Lower chest: No acute abnormality. Hepatobiliary: Stable low-attenuation area superficial anterior RIGHT lobe of liver, unchanged from 2012, non worrisome. Otherwise no focal liver abnormality is seen. No gallstones, gallbladder wall thickening, or  biliary dilatation. Pancreas: Unremarkable. No pancreatic ductal dilatation or surrounding inflammatory changes. Spleen: Normal in size without focal abnormality. Adrenals/Urinary Tract: Adrenal glands are unremarkable. Kidneys are normal, without renal calculi, focal lesion, or hydronephrosis. Bladder is unremarkable. Stomach/Bowel: Mild diffuse fluid-filled small bowel distention, suggesting enteritis or nonspecific ileus. No bowel obstruction. No free fluid or free air. No appendiceal inflammation. Vascular/Lymphatic: No significant vascular findings are present. No enlarged abdominal or pelvic lymph nodes. Aortic atherosclerosis. Reproductive: Status post hysterectomy. No adnexal masses. Other: Small fat containing umbilical hernia. Musculoskeletal: No worrisome osseous lesions. Compared to prior CT, the pelvic mass has been removed. IMPRESSION: Mild diffuse fluid-filled small bowel distention, suggesting enteritis or nonspecific ileus. No bowel obstruction. No pelvic mass. Aortic Atherosclerosis (ICD10-I70.0). Electronically Signed   By: Staci Righter M.D.   On: 02/15/2018 14:13    Procedures Procedures (including critical care time)  Medications Ordered in ED Medications  ketorolac (TORADOL) 30 MG/ML injection 30 mg (has no administration in time range)  dicyclomine (BENTYL) capsule 10 mg (has no administration in time range)  sodium chloride 0.9 % bolus 1,000 mL (0 mLs Intravenous Stopped 02/15/18 1407)  ondansetron (ZOFRAN) injection 4 mg (4 mg Intravenous Given 02/15/18 1146)  morphine 4 MG/ML injection 4 mg (4 mg Intravenous Given 02/15/18 1145)  iohexol (OMNIPAQUE) 300 MG/ML solution 100 mL (100 mLs Intravenous Contrast Given 02/15/18 1347)     Initial Impression / Assessment and Plan / ED Course  I have reviewed the triage vital signs and the nursing notes.  Pertinent labs & imaging results that were available during my care of the patient were reviewed by me and considered in my medical  decision making (see chart for details).     Symptomatic only improving here in the emergency department.  CT scan with enteritis and no other significant findings.  Uterine fibroid noted.  Outpatient GI follow-up.  Home with symptomatic management.  Electrolytes normal.  Hydrated in the ER.  Discharged home in good condition.   Final Clinical Impressions(s) / ED Diagnoses   Final diagnoses:  Abdominal pain, unspecified abdominal location    ED Discharge Orders        Ordered  ondansetron (ZOFRAN ODT) 8 MG disintegrating tablet  Every 8 hours PRN     02/15/18 1452    metoCLOPramide (REGLAN) 10 MG tablet  Every 8 hours PRN     02/15/18 1452    dicyclomine (BENTYL) 20 MG tablet  2 times daily     02/15/18 1452       Jola Schmidt, MD 02/15/18 1500

## 2018-02-15 NOTE — ED Notes (Signed)
Pt unable to provide urine specimen at this time

## 2018-02-15 NOTE — Discharge Instructions (Addendum)
Please call the gastroenterologist for follow-up

## 2018-02-15 NOTE — ED Triage Notes (Signed)
Pt presents with abd pain and distention for month, accelerated yesterday. Reports that is hard to eat but was able to eat and drink yesterday. Abd tenderness.

## 2018-02-15 NOTE — ED Notes (Signed)
ED Provider at bedside. 

## 2018-03-05 ENCOUNTER — Encounter: Payer: Self-pay | Admitting: Physician Assistant

## 2018-03-05 ENCOUNTER — Ambulatory Visit: Payer: Self-pay | Admitting: Family Medicine

## 2018-03-17 MED FILL — LISINOPRIL-HCTZ 20-12.5 MG: 20-12.5 | 30 days supply | Qty: 30 | Fill #1

## 2018-03-17 MED FILL — METOPROLOL SUCCINATE ER 50: 50 | 30 days supply | Qty: 30 | Fill #6

## 2018-03-17 MED FILL — AMLODIPINE BESYLATE 5 MG TA: 5 | 30 days supply | Qty: 30 | Fill #8

## 2018-04-22 ENCOUNTER — Encounter (HOSPITAL_COMMUNITY): Payer: Self-pay

## 2018-04-22 ENCOUNTER — Other Ambulatory Visit: Payer: Self-pay

## 2018-04-22 ENCOUNTER — Emergency Department (HOSPITAL_COMMUNITY)
Admission: EM | Admit: 2018-04-22 | Discharge: 2018-04-22 | Disposition: A | Discharge status: Home / Self Care | Payer: Self-pay | Attending: Emergency Medicine | Admitting: Emergency Medicine

## 2018-04-22 DIAGNOSIS — I10 Essential (primary) hypertension: Secondary | ICD-10-CM | POA: Insufficient documentation

## 2018-04-22 DIAGNOSIS — K0889 Other specified disorders of teeth and supporting structures: Secondary | ICD-10-CM | POA: Insufficient documentation

## 2018-04-22 DIAGNOSIS — Z79899 Other long term (current) drug therapy: Secondary | ICD-10-CM | POA: Insufficient documentation

## 2018-04-22 DIAGNOSIS — F17210 Nicotine dependence, cigarettes, uncomplicated: Secondary | ICD-10-CM | POA: Insufficient documentation

## 2018-04-22 MED ORDER — PENICILLIN V POTASSIUM 500 MG PO TABS: 500.0000 mg | ORAL_TABLET | Freq: Four times a day (QID) | ORAL | 0 refills | Status: AC

## 2018-04-22 MED ORDER — OXYCODONE-ACETAMINOPHEN 5-325 MG PO TABS
1.0000 | ORAL_TABLET | Freq: Once | ORAL | Status: AC
  Administered 2018-04-22: 1 via ORAL
  Filled 2018-04-22: qty 1

## 2018-04-22 MED ORDER — PENICILLIN V POTASSIUM 250 MG PO TABS
500.0000 mg | ORAL_TABLET | Freq: Once | ORAL | Status: AC
  Administered 2018-04-22: 500 mg via ORAL
  Filled 2018-04-22: qty 2

## 2018-04-22 MED ORDER — ACETAMINOPHEN 325 MG PO TABS
650.0000 mg | ORAL_TABLET | Freq: Once | ORAL | Status: AC
  Administered 2018-04-22: 650 mg via ORAL
  Filled 2018-04-22: qty 2

## 2018-04-22 MED ORDER — LIDOCAINE VISCOUS HCL 2 % MT SOLN: 15.0000 mL | OROMUCOSAL | 0 refills | Status: DC | PRN

## 2018-04-22 MED ORDER — NAPROXEN 500 MG PO TABS: 250.0000 mg | ORAL_TABLET | Freq: Two times a day (BID) | ORAL | 0 refills | Status: DC

## 2018-04-22 NOTE — ED Triage Notes (Signed)
Pt endorses left lower dental pain since last night, does not have a dentist. VSS.

## 2018-04-22 NOTE — ED Provider Notes (Signed)
[facility] DEPART[facility] Provider Note   CSN: [id number] Arrival date & time: 04/22/18  1016     History   Chief Complaint Chief Complaint  Patient presents with  . Dental Pain    HPI Jennifer [name] is a 57 y.o. female with past medical history of hypertension, polysubstance abuse, who presents to ED for evaluation of dental pain.  States that symptoms have been going on for several days.  Pain is sharp, located in her left lower jaw and radiating up to her ear.  No improvement with Tylenol.  Denies any trouble breathing or trouble swallowing, trismus, drooling, fever, drainage from the area.  Last saw dentist 2 years ago.  She does not have a dentist that she can follow-up with.  HPI  Past Medical History:  Diagnosis Date  . bipolar   . Bipolar 1 disorder ([place])   . Depression   . Fibroids    now s/p hysterectomy  . Hypertension   . Left ventricular hypertrophy   . Polysubstance abuse ([place])    IV drug [place], cocaine on occassion, smoking and alcoholism, has been going to AA, not completely sober    Patient Active Problem List   Diagnosis Date Noted  . Current smoker 08/12/2016  . Homeless single person 08/12/2016  . Skin lesion of left leg 03/29/2015  . Trigger middle finger of right hand 01/11/2015  . Discoloration of eye 01/11/2015  . Bradycardia 01/11/2015  . Skin lesion of scalp 07/17/2014  . Leg swelling 05/20/2013  . Vasomotor rhinitis 08/17/2012  . Right leg pain 07/26/2012  . Anxiety 02/26/2012  . GERD (gastroesophageal reflux disease) 08/08/2011  . Menopausal symptoms 07/23/2011  . Mastalgia 07/23/2011  . Bipolar disorder, current episode manic without psychotic features, moderate ([place]) 12/05/2010  . 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 SEBACEOUS CYST, SCALP 05/06/2010  . SYNOVIAL CYST 05/03/2010  . Abdominal pain 12/06/2009  . DRUG ABUSE, HX OF 12/06/2009  . Lipoma of shoulder 02/06/2009  . FIBROIDS, UTERUS 02/06/2009  . DRUG  ABUSE 02/06/2009  . Essential hypertension, benign 02/06/2009  . PAIN IN JOINT, SHOULDER REGION 02/06/2009  . Insomnia 02/06/2009  . PELVIC MASS 02/06/2009    Past Surgical History:  Procedure Laterality Date  . ABDOMINAL HYSTERECTOMY     2003  . BILATERAL SALPINGOOPHORECTOMY     01/2010  . DILATION AND CURETTAGE OF UTERUS    . EXPLORATORY LAPAROTOMY     complex pelvic mass 2011  . TONSILLECTOMY       OB History    Gravida  5   Para  0   Term  0   Preterm  0   AB  5   Living  0     SAB  2   TAB  1   Ectopic  0   Multiple  0   Live Births               Home Medications    Prior to Admission medications   Medication Sig Start Date End Date Taking? Authorizing Provider  acetaminophen (TYLENOL) 500 MG tablet Take 1 tablet (500 mg total) by mouth every 8 (eight) hours as needed. Patient not taking: Reported on 08/28/2017 07/22/17   Ladell Pier, MD  amLODipine (NORVASC) 5 MG tablet Take 1 tablet (5 mg total) by mouth daily. 05/15/17   Ladell Pier, MD  dicyclomine (BENTYL) 20 MG tablet Take 1 tablet (20 mg total) by mouth 2 (two) times daily. 02/15/18  Jola Schmidt, MD  diphenoxylate-atropine (LOMOTIL) 2.5-0.025 MG tablet Take 1 tablet by mouth 4 (four) times daily as needed for diarrhea or loose stools. 02/03/18   Argentina Donovan, PA-C  lidocaine (XYLOCAINE) 2 % solution Use as directed 15 mLs in the mouth or throat as needed for mouth pain. 04/22/18   Josaiah Muhammed, PA-C  lisinopril-hydrochlorothiazide (PRINZIDE,ZESTORETIC) 20-12.5 MG tablet TAKE 1 TABLET BY MOUTH DAILY. 01/11/18   Ladell Pier, MD  methocarbamol (ROBAXIN) 500 MG tablet Take 1 tablet (500 mg total) by mouth every 8 (eight) hours as needed for muscle spasms. 01/27/18   Charlott Rakes, MD  metoCLOPramide (REGLAN) 10 MG tablet Take 1 tablet (10 mg total) by mouth every 8 (eight) hours as needed for nausea. 02/15/18   Jola Schmidt, MD  metoprolol succinate (TOPROL-XL) 50 MG 24 hr  tablet Take 1 tablet (50 mg total) by mouth daily. Take with or immediately following a meal. 05/15/17   Ladell Pier, MD  naproxen (NAPROSYN) 500 MG tablet Take 0.5 tablets (250 mg total) by mouth 2 (two) times daily. 04/22/18   Marieanne Marxen, PA-C  naproxen sodium (ALEVE) 220 MG tablet Take 440 mg by mouth 2 (two) times daily as needed (pain).     [provider]  ondansetron (ZOFRAN ODT) 8 MG disintegrating tablet Take 1 tablet (8 mg total) by mouth every 8 (eight) hours as needed for nausea or vomiting. 02/15/18   Jola Schmidt, MD  PARoxetine (PAXIL) 40 MG tablet Take 1 tablet (40 mg total) by mouth daily. 07/22/17   Ladell Pier, MD  penicillin v potassium (VEETID) 500 MG tablet Take 1 tablet (500 mg total) by mouth 4 (four) times daily for 7 days. 04/22/18 04/29/18  Delia Heady, PA-C    Family History Family History  Problem Relation Age of Onset  . Hypertension Mother   . Hypertension Sister   . Diabetes Father     Social History Social History   Tobacco Use  . Smoking status: Current Every Day Smoker    Packs/day: 0.50    Years: 20.00    Pack years: 10.00    Types: Cigarettes  . Smokeless tobacco: Never Used  . Tobacco comment: Wants to quit.   Substance Use Topics  . Alcohol use: No    Alcohol/week: 0.0 standard drinks  . Drug use: Not Currently    Types: Cocaine, Marijuana     Allergies   Patient has no known allergies.   Review of Systems Review of Systems  Constitutional: Negative for chills and fever.  HENT: Positive for dental problem. Negative for ear discharge, ear pain, facial swelling, mouth sores, sore throat and trouble swallowing.   Musculoskeletal: Negative for neck pain and neck stiffness.     Physical Exam Updated Vital Signs BP (!) 136/98 (BP Location: Right Arm)   Pulse 67   Temp 98.2 F (36.8 C) (Oral)   Resp 16   Ht 5\' 4"  (1.626 m)   Wt 83.9 kg   LMP 12/04/2009   SpO2 99%   BMI 31.76 kg/m   Physical Exam    Constitutional: She appears well-developed and well-nourished. No distress.  HENT:  Head: Normocephalic and atraumatic.  Mouth/Throat: Uvula is midline. She does not have dentures. No oral lesions. No trismus in the jaw. Abnormal dentition. Dental caries present. No dental abscesses, uvula swelling or lacerations. No tonsillar exudate.    Overall poor dentition with several missing and decaying teeth noted.  Tenderness to palpation over the  indicated gumline with no gross dental abscess or site of drainage noted at this time. No facial, neck or cheek swelling noted. No pooling of secretions or trismus.  Normal voice noted with no difficulty swallowing or breathing.  No submandibular edema, erythema or crepitus noted.  Eyes: Conjunctivae and EOM are normal. No scleral icterus.  Neck: Normal range of motion.  Pulmonary/Chest: Effort normal. No respiratory distress.  Neurological: She is alert.  Skin: No rash noted. She is not diaphoretic.  Psychiatric: She has a normal mood and affect.  Nursing note and vitals reviewed.    ED Treatments / Results  Labs (all labs ordered are listed, but only abnormal results are displayed) Labs Reviewed - No data to display  EKG None  Radiology No results found.  Procedures .Nerve Block Date/Time: 04/22/2018 1:42 PM Performed by: Delia Heady, PA-C Authorized by: Delia Heady, PA-C   Consent:    Consent obtained:  Verbal   Consent given by:  Patient   Risks discussed:  Allergic reaction, bleeding, infection, intravenous injection, nerve damage, pain, swelling and unsuccessful block Indications:    Indications:  Pain relief Location:    Body area:  Head   Head nerve blocked: inferior alveolar.   Laterality:  Left Procedure details (see MAR for exact dosages):    Block needle gauge:  27 G   Anesthetic injected:  Bupivacaine 0.5% WITH epi   Steroid injected:  None   Injection procedure:  Anatomic landmarks identified Post-procedure  details:    Outcome:  Anesthesia achieved   (including critical care time)  Medications Ordered in ED Medications  oxyCODONE-acetaminophen (PERCOCET/ROXICET) 5-325 MG per tablet 1 tablet (has no administration in time range)  penicillin v potassium (VEETID) tablet 500 mg (has no administration in time range)  acetaminophen (TYLENOL) tablet 650 mg (650 mg Oral Given 04/22/18 1245)     Initial Impression / Assessment and Plan / ED Course  I have reviewed the triage vital signs and the nursing notes.  Pertinent labs & imaging results that were available during my care of the patient were reviewed by me and considered in my medical decision making (see chart for details).     Patient with dentalgia. On exam, there is no evidence of a drainable abscess. No trismus, glossal elevation, unilateral tonsillar swelling. No evidence of retropharyngeal or peritonsillar abscess or Ludwig angina. Will treat with  penicillin and anti-inflammatories. Pt instructed to follow-up with dentist as soon as possible. Resource guide provided with AVS.  Portions of this note were generated with [employer]. Dictation errors may occur despite best attempts at proofreading.  Final Clinical Impressions(s) / ED Diagnoses   Final diagnoses:  Pain, dental    ED Discharge Orders         Ordered    naproxen (NAPROSYN) 500 MG tablet  2 times daily     04/22/18 1338    lidocaine (XYLOCAINE) 2 % solution  As needed     04/22/18 1338    penicillin v potassium (VEETID) 500 MG tablet  4 times daily     04/22/18 1338           Delia Heady, PA-C 04/22/18 1343    Gareth Morgan, MD 04/22/18 2101

## 2018-04-22 NOTE — ED Notes (Signed)
Declined W/C at D/C and was escorted to lobby by RN. 

## 2018-04-22 NOTE — Discharge Instructions (Addendum)
Return to ED for worsening symptoms, trouble breathing or trouble swallowing, worsening of your facial pain or swelling, trouble moving her neck. Continue the entire course of antibiotics regardless of symptom improvement to prevent worsening or recurrence of your infection. Swish and spit lidocaine to help with mouth discomfort.  Do not swallow. We will ultimately need to follow-up with a dentist for management of your dental pain.

## 2018-05-04 MED FILL — AMLODIPINE BESYLATE 5 MG TA: 5 | 30 days supply | Qty: 30 | Fill #9

## 2018-05-04 MED FILL — METOPROLOL SUCCINATE ER 50: 50 | 30 days supply | Qty: 30 | Fill #7

## 2018-05-04 MED FILL — PARoxetine HCL 40 MG TABS: 40 | 30 days supply | Qty: 30 | Fill #4

## 2018-05-05 ENCOUNTER — Other Ambulatory Visit: Payer: Self-pay

## 2018-05-05 DIAGNOSIS — I10 Essential (primary) hypertension: Secondary | ICD-10-CM

## 2018-05-05 MED ORDER — LISINOPRIL-HYDROCHLOROTHIAZIDE 20-12.5 MG PO TABS: 1.0000 | ORAL_TABLET | Freq: Every day | ORAL | 0 refills | Status: DC

## 2018-05-05 MED FILL — LISINOPRIL-HCTZ 20-12.5 MG: 20-12.5 | 30 days supply | Qty: 30 | Fill #0

## 2018-05-05 NOTE — Telephone Encounter (Signed)
Patient is requesting a refill on Pantoprazole 40mg , it is not on her current med list. It was last written by Dr. Adrian Blackwater on 09/30/16 and was noted in her chart as not taking in 04/2017. Please refill if appropriate.

## 2018-05-13 ENCOUNTER — Ambulatory Visit: Payer: Self-pay

## 2018-05-20 ENCOUNTER — Ambulatory Visit: Payer: Self-pay | Admitting: Family Medicine

## 2018-05-31 ENCOUNTER — Other Ambulatory Visit: Payer: Self-pay | Admitting: Internal Medicine

## 2018-05-31 ENCOUNTER — Telehealth: Payer: Self-pay

## 2018-05-31 DIAGNOSIS — I10 Essential (primary) hypertension: Secondary | ICD-10-CM

## 2018-05-31 MED FILL — PENICILLIN VK 500 MG TABLET: 500 | 7 days supply | Qty: 28 | Fill #0

## 2018-05-31 MED FILL — LISINOPRIL-HCTZ 20-12.5 MG: 20-12.5 | 30 days supply | Qty: 30 | Fill #0

## 2018-05-31 NOTE — Telephone Encounter (Addendum)
Pt came to [facility] because she said she has an "infection in my whole head".  She said she has "snot" but it is not discolored, she c/o feeling stuffy and that she jaw bone and throat pain, and eyes hurt.   She said she was prescribed penicillin a month ago but she never picked it up because she did not have the money.  She decided to buy and take the pcn.  She was 152/99, 77, 16, 98.6. Pt admitted that she had not been taking her BP medication when this nurse informed her of the high BP.  She asked when did she last take her BP medications last she appeared angry and said,  "I told you already that I don't have money". Then she asked me to tell pharmacy to give her only one dose of each med, just antihypertensive medication for today only.  Pt then stood up and said "I'm gonna go.  I don't have time for this" and walked out of the room and left the center. Nurse f/u with pharmacy who said they did dispense the Lisinopril/HCTZ and the Penicillin today to the patient. The patient insisted on getting her Amlodipine but the pharmacy informed her that it had been over 6 months and she had not had a hospital f/u visit with a provider, and this would have to be done before the Amlodipine could be filled.

## 2018-06-02 ENCOUNTER — Ambulatory Visit: Payer: Self-pay

## 2018-06-08 ENCOUNTER — Ambulatory Visit: Payer: Self-pay | Admitting: Internal Medicine

## 2018-06-11 ENCOUNTER — Telehealth (HOSPITAL_COMMUNITY): Payer: Self-pay

## 2018-06-11 NOTE — Telephone Encounter (Signed)
Call could not be completed. 

## 2018-06-16 ENCOUNTER — Emergency Department (HOSPITAL_COMMUNITY)
Admission: EM | Admit: 2018-06-16 | Discharge: 2018-06-16 | Disposition: A | Discharge status: Home / Self Care | Payer: Self-pay | Attending: Emergency Medicine | Admitting: Emergency Medicine

## 2018-06-16 ENCOUNTER — Encounter (HOSPITAL_COMMUNITY): Payer: Self-pay | Admitting: Emergency Medicine

## 2018-06-16 ENCOUNTER — Emergency Department (HOSPITAL_COMMUNITY): Payer: Self-pay

## 2018-06-16 DIAGNOSIS — R51 Headache: Secondary | ICD-10-CM | POA: Insufficient documentation

## 2018-06-16 DIAGNOSIS — F17210 Nicotine dependence, cigarettes, uncomplicated: Secondary | ICD-10-CM | POA: Insufficient documentation

## 2018-06-16 DIAGNOSIS — I10 Essential (primary) hypertension: Secondary | ICD-10-CM | POA: Insufficient documentation

## 2018-06-16 DIAGNOSIS — G8929 Other chronic pain: Secondary | ICD-10-CM

## 2018-06-16 DIAGNOSIS — Z79899 Other long term (current) drug therapy: Secondary | ICD-10-CM | POA: Insufficient documentation

## 2018-06-16 LAB — CBC WITH DIFFERENTIAL/PLATELET
ABS IMMATURE GRANULOCYTES: 0.01 10*3/uL (ref 0.00–0.07)
Basophils Absolute: 0 10*3/uL (ref 0.0–0.1)
Basophils Relative: 1 %
Eosinophils Absolute: 0.1 10*3/uL (ref 0.0–0.5)
Eosinophils Relative: 2 %
HCT: 50 % — ABNORMAL HIGH (ref 36.0–46.0)
HEMOGLOBIN: 15.7 g/dL — AB (ref 12.0–15.0)
IMMATURE GRANULOCYTES: 0 %
LYMPHS PCT: 42 %
Lymphs Abs: 2 10*3/uL (ref 0.7–4.0)
MCH: 28.4 pg (ref 26.0–34.0)
MCHC: 31.4 g/dL (ref 30.0–36.0)
MCV: 90.6 fL (ref 80.0–100.0)
MONO ABS: 0.4 10*3/uL (ref 0.1–1.0)
MONOS PCT: 8 %
NEUTROS ABS: 2.2 10*3/uL (ref 1.7–7.7)
NEUTROS PCT: 47 %
Platelets: 263 10*3/uL (ref 150–400)
RBC: 5.52 MIL/uL — AB (ref 3.87–5.11)
RDW: 15 % (ref 11.5–15.5)
WBC: 4.6 10*3/uL (ref 4.0–10.5)
nRBC: 0 % (ref 0.0–0.2)

## 2018-06-16 LAB — BASIC METABOLIC PANEL
ANION GAP: 8 (ref 5–15)
BUN: 16 mg/dL (ref 6–20)
CHLORIDE: 104 mmol/L (ref 98–111)
CO2: 26 mmol/L (ref 22–32)
Calcium: 9.9 mg/dL (ref 8.9–10.3)
Creatinine, Ser: 0.84 mg/dL (ref 0.44–1.00)
GFR calc Af Amer: >60 mL/min (ref >60)
Glucose, Bld: 81 mg/dL (ref 70–99)
POTASSIUM: 4.2 mmol/L (ref 3.5–5.1)
SODIUM: 138 mmol/L (ref 135–145)

## 2018-06-16 MED ORDER — DIPHENHYDRAMINE HCL 50 MG/ML IJ SOLN
25.0000 mg | Freq: Once | INTRAMUSCULAR | Status: AC
  Administered 2018-06-16: 25 mg via INTRAVENOUS
  Filled 2018-06-16: qty 1

## 2018-06-16 MED ORDER — PROCHLORPERAZINE EDISYLATE 10 MG/2ML IJ SOLN
10.0000 mg | Freq: Once | INTRAMUSCULAR | Status: AC
  Administered 2018-06-16: 10 mg via INTRAVENOUS
  Filled 2018-06-16: qty 2

## 2018-06-16 MED ORDER — AMLODIPINE BESYLATE 5 MG PO TABS: 5.0000 mg | ORAL_TABLET | Freq: Every day | ORAL | 0 refills | Status: DC

## 2018-06-16 MED FILL — AMLODIPINE BESYLATE 5 MG TA: 5 | 30 days supply | Qty: 30 | Fill #0

## 2018-06-16 NOTE — ED Provider Notes (Signed)
[facility] EMERGENCY DEPARTMENT Provider Note   CSN: [id number] Arrival date & time: 06/16/18  0848     History   Chief Complaint Chief Complaint  Patient presents with  . Hypertension  . Headache    HPI Jennifer [name] is a 57 y.o. female.  HPI   58 year old female with a history of bipolar, depression, hypertension who presents with concern for headache for 1 month, daily nausea and vomiting, and elevated blood pressures.  Patient reports that she has been out of her blood pressure medications for approximately 2 weeks.  Reports that she had been taking 3 blood pressure medications, including amlodipine, lisinopril, and hctz however she ran out.  Reports she took a friends lisinopril yesterday evening but did not see an effect of the medication.  Presented today because severe headache. Reports headache began slowly approximately 1 month ago, describes it as a throbbing just frontal, radiates to the back of her head and down her neck.  Reports it is worse at night and in the morning.  Reports every morning she has nausea and vomiting.  Reports she was supposed to go see a gastroenterologist, but did not have the money for this follow-up.  Denies abdominal pain.  Denies numbness, weakness, difficulty talking, walking, chest pain, shortness of breath or visual changes.  Reports her blood pressures were 800L systolic at home. Reports nasal congestion for one year.     Past Medical History:  Diagnosis Date  . bipolar   . Bipolar 1 disorder ([place])   . Depression   . Fibroids    now s/p hysterectomy  . Hypertension   . Left ventricular hypertrophy   . Polysubstance abuse ([place])    IV drug [place], cocaine on occassion, smoking and alcoholism, has been going to AA, not completely sober    Patient Active Problem List   Diagnosis Date Noted  . Current smoker 08/12/2016  . Homeless single person 08/12/2016  . Skin lesion of left leg 03/29/2015  . Trigger middle  finger of right hand 01/11/2015  . Discoloration of eye 01/11/2015  . Bradycardia 01/11/2015  . Skin lesion of scalp 07/17/2014  . Leg swelling 05/20/2013  . Vasomotor rhinitis 08/17/2012  . Right leg pain 07/26/2012  . Anxiety 02/26/2012  . GERD (gastroesophageal reflux disease) 08/08/2011  . Menopausal symptoms 07/23/2011  . Mastalgia 07/23/2011  . Bipolar disorder, current episode manic without psychotic features, moderate ([place]) 12/05/2010  . 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 SEBACEOUS CYST, SCALP 05/06/2010  . SYNOVIAL CYST 05/03/2010  . Abdominal pain 12/06/2009  . DRUG ABUSE, HX OF 12/06/2009  . Lipoma of shoulder 02/06/2009  . FIBROIDS, UTERUS 02/06/2009  . DRUG ABUSE 02/06/2009  . Essential hypertension, benign 02/06/2009  . PAIN IN JOINT, SHOULDER REGION 02/06/2009  . Insomnia 02/06/2009  . PELVIC MASS 02/06/2009    Past Surgical History:  Procedure Laterality Date  . ABDOMINAL HYSTERECTOMY     2003  . BILATERAL SALPINGOOPHORECTOMY     01/2010  . DILATION AND CURETTAGE OF UTERUS    . EXPLORATORY LAPAROTOMY     complex pelvic mass 2011  . TONSILLECTOMY       OB History    Gravida  5   Para  0   Term  0   Preterm  0   AB  5   Living  0     SAB  2   TAB  1   Ectopic  0   Multiple  0  Live Births               Home Medications    Prior to Admission medications   Medication Sig Start Date End Date Taking? Authorizing Provider  acetaminophen (TYLENOL) 500 MG tablet Take 1 tablet (500 mg total) by mouth every 8 (eight) hours as needed. Patient not taking: Reported on 08/28/2017 07/22/17   Ladell Pier, MD  amLODipine (NORVASC) 5 MG tablet TAKE 1 TABLET BY MOUTH DAILY. 05/31/18   Ladell Pier, MD  dicyclomine (BENTYL) 20 MG tablet Take 1 tablet (20 mg total) by mouth 2 (two) times daily. 02/15/18   Jola Schmidt, MD  diphenoxylate-atropine (LOMOTIL) 2.5-0.025 MG tablet Take 1 tablet by mouth 4 (four) times daily as needed for  diarrhea or loose stools. 02/03/18   Argentina Donovan, PA-C  lidocaine (XYLOCAINE) 2 % solution Use as directed 15 mLs in the mouth or throat as needed for mouth pain. 04/22/18   Khatri, Hina, PA-C  lisinopril-hydrochlorothiazide (PRINZIDE,ZESTORETIC) 20-12.5 MG tablet Take 1 tablet by mouth daily. 05/05/18   Charlott Rakes, MD  methocarbamol (ROBAXIN) 500 MG tablet Take 1 tablet (500 mg total) by mouth every 8 (eight) hours as needed for muscle spasms. 01/27/18   Charlott Rakes, MD  metoCLOPramide (REGLAN) 10 MG tablet Take 1 tablet (10 mg total) by mouth every 8 (eight) hours as needed for nausea. 02/15/18   Jola Schmidt, MD  metoprolol succinate (TOPROL-XL) 50 MG 24 hr tablet TAKE 1 TABLET BY MOUTH DAILY. TAKE WITH OR IMMEDIATELY FOLLOWING A MEAL. 05/31/18   Ladell Pier, MD  naproxen (NAPROSYN) 500 MG tablet Take 0.5 tablets (250 mg total) by mouth 2 (two) times daily. 04/22/18   Khatri, Hina, PA-C  naproxen sodium (ALEVE) 220 MG tablet Take 440 mg by mouth 2 (two) times daily as needed (pain).     [provider]  ondansetron (ZOFRAN ODT) 8 MG disintegrating tablet Take 1 tablet (8 mg total) by mouth every 8 (eight) hours as needed for nausea or vomiting. 02/15/18   Jola Schmidt, MD  PARoxetine (PAXIL) 40 MG tablet Take 1 tablet (40 mg total) by mouth daily. 07/22/17   Ladell Pier, MD    Family History Family History  Problem Relation Age of Onset  . Hypertension Mother   . Hypertension Sister   . Diabetes Father     Social History Social History   Tobacco Use  . Smoking status: Current Every Day Smoker    Packs/day: 0.50    Years: 20.00    Pack years: 10.00    Types: Cigarettes  . Smokeless tobacco: Never Used  . Tobacco comment: Wants to quit.   Substance Use Topics  . Alcohol use: No    Alcohol/week: 0.0 standard drinks  . Drug use: Not Currently    Types: Cocaine, Marijuana     Allergies   Patient has no known allergies.   Review of  Systems Review of Systems  Constitutional: Negative for fever.  HENT: Negative for sore throat.   Eyes: Negative for visual disturbance.  Respiratory: Negative for cough and shortness of breath.   Cardiovascular: Negative for chest pain.  Gastrointestinal: Negative for abdominal pain, nausea and vomiting.  Genitourinary: Negative for difficulty urinating and hematuria.  Musculoskeletal: Negative for back pain and neck pain.  Skin: Negative for rash.  Neurological: Positive for headaches. Negative for syncope, facial asymmetry, speech difficulty, weakness and numbness.     Physical Exam Updated Vital Signs BP (!) 148/99 (  BP Location: Right Arm)   Pulse 73   Temp 98 F (36.7 C) (Oral)   Resp 18   Wt 81 kg   LMP 12/04/2009   SpO2 100%   BMI 30.65 kg/m   Physical Exam  Constitutional: She is oriented to person, place, and time. She appears well-developed and well-nourished. No distress.  HENT:  Head: Normocephalic and atraumatic.  Eyes: Conjunctivae and EOM are normal.  Neck: Normal range of motion.  Cardiovascular: Normal rate, regular rhythm, normal heart sounds and intact distal pulses. Exam reveals no gallop and no friction rub.  No murmur heard. Pulmonary/Chest: Effort normal and breath sounds normal. No respiratory distress. She has no wheezes. She has no rales.  Abdominal: Soft. She exhibits no distension. There is no tenderness. There is no guarding.  Musculoskeletal: She exhibits no edema or tenderness.  Neurological: She is alert and oriented to person, place, and time. She has normal strength. No cranial nerve deficit or sensory deficit. Coordination normal. GCS eye subscore is 4. GCS verbal subscore is 5. GCS motor subscore is 6.  Skin: Skin is warm and dry. No rash noted. She is not diaphoretic. No erythema.  Nursing note and vitals reviewed.    ED Treatments / Results  Labs (all labs ordered are listed, but only abnormal results are displayed) Labs Reviewed   CBC WITH DIFFERENTIAL/PLATELET  BASIC METABOLIC PANEL    EKG None  Radiology No results found.  Procedures Procedures (including critical care time)  Medications Ordered in ED Medications  prochlorperazine (COMPAZINE) injection 10 mg (10 mg Intravenous Given 06/16/18 0934)  diphenhydrAMINE (BENADRYL) injection 25 mg (25 mg Intravenous Given 06/16/18 0934)     Initial Impression / Assessment and Plan / ED Course  I have reviewed the triage vital signs and the nursing notes.  Pertinent labs & imaging results that were available during my care of the patient were reviewed by me and considered in my medical decision making (see chart for details).     57 year old female with a history of bipolar, depression, hypertension who presents with concern for headache for 1 month, daily nausea and vomiting, and elevated blood pressures.  Given age, severity and persistence of headache, AM n/v, CT head ordered which shows no significant abnormalities  Labs show no significant abnormalities. Patient had taken 2 lisinopril from a friend last night, and today most of her blood pressures are normotensive.  She is asking for refill of her blood pressure medications.  Given she took the medications last evening, I expect that the lisinopril is currently affecting her blood pressures making her normotensive and pt reports significant elevation last night. Feel it is reasonable to re-initiate low dose amlodipine 5mg  (which she had been previously prescribed) tomorrow. Patient discharged in stable condition with understanding of reasons to return.   Final Clinical Impressions(s) / ED Diagnoses   Final diagnoses:  Chronic nonintractable headache, unspecified headache type    ED Discharge Orders    None       Gareth Morgan, MD 06/16/18 2147

## 2018-06-16 NOTE — ED Notes (Addendum)
PT reports she is ready to leave and requests that monitor be taken off so she can go. She is hungry and thirsty and was told she cannot have anything until her CT was read. CT is back and PT will be given a meal. PT is taken off of monitor, last BP was within normal range

## 2018-06-16 NOTE — ED Triage Notes (Addendum)
Pt reports she takes 3 BP meds. She has been out of these for nearly 2 weeks. PT reports a throbbing headache that started a month ago. PT found 2 lisinopril tablets yesterday and took those.   PT has been taking aleve, ibuprofen for pain.   PT adds that she has vomited every morning for 2-3 months

## 2018-06-25 ENCOUNTER — Emergency Department (HOSPITAL_COMMUNITY)
Admission: EM | Admit: 2018-06-25 | Discharge: 2018-06-25 | Disposition: A | Discharge status: Home / Self Care | Payer: Self-pay | Attending: Emergency Medicine | Admitting: Emergency Medicine

## 2018-06-25 DIAGNOSIS — K0889 Other specified disorders of teeth and supporting structures: Secondary | ICD-10-CM | POA: Insufficient documentation

## 2018-06-25 DIAGNOSIS — F17210 Nicotine dependence, cigarettes, uncomplicated: Secondary | ICD-10-CM | POA: Insufficient documentation

## 2018-06-25 DIAGNOSIS — Z79899 Other long term (current) drug therapy: Secondary | ICD-10-CM | POA: Insufficient documentation

## 2018-06-25 DIAGNOSIS — Z760 Encounter for issue of repeat prescription: Secondary | ICD-10-CM | POA: Insufficient documentation

## 2018-06-25 DIAGNOSIS — I10 Essential (primary) hypertension: Secondary | ICD-10-CM | POA: Insufficient documentation

## 2018-06-25 MED ORDER — LISINOPRIL-HYDROCHLOROTHIAZIDE 20-12.5 MG PO TABS: 1.0000 | ORAL_TABLET | Freq: Every day | ORAL | 0 refills | Status: DC

## 2018-06-25 MED ORDER — AMLODIPINE BESYLATE 5 MG PO TABS: 5.0000 mg | ORAL_TABLET | Freq: Every day | ORAL | 0 refills | Status: DC

## 2018-06-25 MED ORDER — METOPROLOL SUCCINATE ER 50 MG PO TB24: ORAL_TABLET | ORAL | 0 refills | Status: DC

## 2018-06-25 MED ORDER — PENICILLIN V POTASSIUM 500 MG PO TABS: 500.0000 mg | ORAL_TABLET | Freq: Three times a day (TID) | ORAL | 0 refills | Status: DC

## 2018-06-25 MED FILL — PENICILLIN VK 500 MG TABLET: 500 | 10 days supply | Qty: 30 | Fill #0

## 2018-06-25 MED FILL — METOPROLOL SUCCINATE ER 50: 50 | 30 days supply | Qty: 30 | Fill #0

## 2018-06-25 MED FILL — LISINOPRIL-HCTZ 20-12.5 MG: 20-12.5 | 30 days supply | Qty: 30 | Fill #0

## 2018-06-25 NOTE — ED Notes (Signed)
Patient would like something for pain,bp medicine and antibiotic before she leaves.

## 2018-06-25 NOTE — ED Triage Notes (Signed)
Patient c/o right lower dental pain. Multiple teeth. Patient also reports tht she has not taken her BP meds x 1 month because she ran out.

## 2018-06-25 NOTE — ED Notes (Signed)
MD not available at this time. Pt states " Nevermind I will just get the prescriptions Monday" Pt left the ED with DC paperwork prior to signing and vitals

## 2018-06-25 NOTE — ED Notes (Signed)
Pt informed that [facility] MD would not be giving her a dose of medications here. Pt irrate and upset. Pt "shushing" staff when staff would try to speak. Pt then asking for paper prescriptions instead of electronic prescriptions sent to her pharmacy. This RN will ask MD.

## 2018-06-25 NOTE — Discharge Instructions (Signed)
Please read the discharge instructions provided including the information for low cost dental clinics

## 2018-06-25 NOTE — ED Provider Notes (Signed)
[facility] DEPT Provider Note   CSN: [id number] Arrival date & time: 06/25/18  0844     History   Chief Complaint Chief Complaint  Patient presents with  . Dental Pain  . Medication Refill    HPI Jennifer [name] is a 57 y.o. female.  HPI Patient is a 57 year old female presents to the emergency department with right lower dental pain.  This been present over the past 48 hours.  She has a history of dental infection in the same place before but does not have the resources to see a dentist as an outpatient.  She is also requesting refills of her blood pressure medication.  She denies fevers and chills.  No difficulty breathing or swallowing.  No facial swelling.    Past Medical History:  Diagnosis Date  . bipolar   . Bipolar 1 disorder ([place])   . Depression   . Fibroids    now s/p hysterectomy  . Hypertension   . Left ventricular hypertrophy   . Polysubstance abuse ([place])    IV drug [place], cocaine on occassion, smoking and alcoholism, has been going to AA, not completely sober    Patient Active Problem List   Diagnosis Date Noted  . Current smoker 08/12/2016  . Homeless single person 08/12/2016  . Skin lesion of left leg 03/29/2015  . Trigger middle finger of right hand 01/11/2015  . Discoloration of eye 01/11/2015  . Bradycardia 01/11/2015  . Skin lesion of scalp 07/17/2014  . Leg swelling 05/20/2013  . Vasomotor rhinitis 08/17/2012  . Right leg pain 07/26/2012  . Anxiety 02/26/2012  . GERD (gastroesophageal reflux disease) 08/08/2011  . Menopausal symptoms 07/23/2011  . Mastalgia 07/23/2011  . Bipolar disorder, current episode manic without psychotic features, moderate ([place]) 12/05/2010  . 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 SEBACEOUS CYST, SCALP 05/06/2010  . SYNOVIAL CYST 05/03/2010  . Abdominal pain 12/06/2009  . DRUG ABUSE, HX OF 12/06/2009  . Lipoma of shoulder 02/06/2009  . FIBROIDS, UTERUS 02/06/2009  . DRUG ABUSE  02/06/2009  . Essential hypertension, benign 02/06/2009  . PAIN IN JOINT, SHOULDER REGION 02/06/2009  . Insomnia 02/06/2009  . PELVIC MASS 02/06/2009    Past Surgical History:  Procedure Laterality Date  . ABDOMINAL HYSTERECTOMY     2003  . BILATERAL SALPINGOOPHORECTOMY     01/2010  . DILATION AND CURETTAGE OF UTERUS    . EXPLORATORY LAPAROTOMY     complex pelvic mass 2011  . TONSILLECTOMY       OB History    Gravida  5   Para  0   Term  0   Preterm  0   AB  5   Living  0     SAB  2   TAB  1   Ectopic  0   Multiple  0   Live Births               Home Medications    Prior to Admission medications   Medication Sig Start Date End Date Taking? Authorizing Provider  acetaminophen (TYLENOL) 500 MG tablet Take 1 tablet (500 mg total) by mouth every 8 (eight) hours as needed. Patient not taking: Reported on 08/28/2017 07/22/17   Ladell Pier, MD  amLODipine (NORVASC) 5 MG tablet Take 1 tablet (5 mg total) by mouth daily. 06/25/18   Jola Schmidt, MD  dicyclomine (BENTYL) 20 MG tablet Take 1 tablet (20 mg total) by mouth 2 (two) times daily. 02/15/18  Jola Schmidt, MD  diphenoxylate-atropine (LOMOTIL) 2.5-0.025 MG tablet Take 1 tablet by mouth 4 (four) times daily as needed for diarrhea or loose stools. 02/03/18   Argentina Donovan, PA-C  lidocaine (XYLOCAINE) 2 % solution Use as directed 15 mLs in the mouth or throat as needed for mouth pain. 04/22/18   Khatri, Hina, PA-C  lisinopril-hydrochlorothiazide (PRINZIDE,ZESTORETIC) 20-12.5 MG tablet Take 1 tablet by mouth daily. 06/25/18   Jola Schmidt, MD  methocarbamol (ROBAXIN) 500 MG tablet Take 1 tablet (500 mg total) by mouth every 8 (eight) hours as needed for muscle spasms. 01/27/18   Charlott Rakes, MD  metoCLOPramide (REGLAN) 10 MG tablet Take 1 tablet (10 mg total) by mouth every 8 (eight) hours as needed for nausea. 02/15/18   Jola Schmidt, MD  metoprolol succinate (TOPROL-XL) 50 MG 24 hr tablet TAKE 1  TABLET BY MOUTH DAILY. TAKE WITH OR IMMEDIATELY FOLLOWING A MEAL. 06/25/18   Jola Schmidt, MD  naproxen (NAPROSYN) 500 MG tablet Take 0.5 tablets (250 mg total) by mouth 2 (two) times daily. 04/22/18   Khatri, Hina, PA-C  naproxen sodium (ALEVE) 220 MG tablet Take 440 mg by mouth 2 (two) times daily as needed (pain).     [provider]  ondansetron (ZOFRAN ODT) 8 MG disintegrating tablet Take 1 tablet (8 mg total) by mouth every 8 (eight) hours as needed for nausea or vomiting. 02/15/18   Jola Schmidt, MD  PARoxetine (PAXIL) 40 MG tablet Take 1 tablet (40 mg total) by mouth daily. 07/22/17   Ladell Pier, MD  penicillin v potassium (VEETID) 500 MG tablet Take 1 tablet (500 mg total) by mouth 3 (three) times daily. 06/25/18   Jola Schmidt, MD    Family History Family History  Problem Relation Age of Onset  . Hypertension Mother   . Hypertension Sister   . Diabetes Father     Social History Social History   Tobacco Use  . Smoking status: Current Every Day Smoker    Packs/day: 0.50    Years: 20.00    Pack years: 10.00    Types: Cigarettes  . Smokeless tobacco: Never Used  . Tobacco comment: Wants to quit.   Substance Use Topics  . Alcohol use: No    Alcohol/week: 0.0 standard drinks  . Drug use: Not Currently    Types: Cocaine, Marijuana     Allergies   Patient has no known allergies.   Review of Systems Review of Systems  All other systems reviewed and are negative.    Physical Exam Updated Vital Signs BP (!) 153/98 (BP Location: Left Arm)   Pulse 76   Temp 98.5 F (36.9 C) (Oral)   Resp 16   LMP 12/04/2009   SpO2 100%   Physical Exam  Constitutional: She is oriented to person, place, and time. She appears well-developed and well-nourished.  HENT:  Head: Normocephalic.  Dental decay right lower second molar without gingival swelling or fluctuance.  Tolerating secretions.  Oral airway patent.  No stridor.  No facial swelling  Eyes: EOM are  normal.  Neck: Normal range of motion.  Pulmonary/Chest: Effort normal.  Abdominal: She exhibits no distension.  Musculoskeletal: Normal range of motion.  Neurological: She is alert and oriented to person, place, and time.  Psychiatric: She has a normal mood and affect.  Nursing note and vitals reviewed.    ED Treatments / Results  Labs (all labs ordered are listed, but only abnormal results are displayed) Labs Reviewed - No data  to display  EKG None  Radiology No results found.  Procedures Procedures (including critical care time)  Medications Ordered in ED Medications - No data to display   Initial Impression / Assessment and Plan / ED Course  I have reviewed the triage vital signs and the nursing notes.  Pertinent labs & imaging results that were available during my care of the patient were reviewed by me and considered in my medical decision making (see chart for details).     Patient is overall well-appearing.  Home with antibiotics.  Refills of her blood pressure medicines given.  Final Clinical Impressions(s) / ED Diagnoses   Final diagnoses:  Pain, dental  Medication refill    ED Discharge Orders         Ordered    metoprolol succinate (TOPROL-XL) 50 MG 24 hr tablet     06/25/18 0900    lisinopril-hydrochlorothiazide (PRINZIDE,ZESTORETIC) 20-12.5 MG tablet  Daily    Note to Pharmacy:  Needs office visit for additional refills   06/25/18 0900    amLODipine (NORVASC) 5 MG tablet  Daily     06/25/18 0900    penicillin v potassium (VEETID) 500 MG tablet  3 times daily     06/25/18 0900           Jola Schmidt, MD 06/25/18 [phone]

## 2018-07-02 ENCOUNTER — Other Ambulatory Visit (HOSPITAL_COMMUNITY): Payer: Self-pay | Admitting: *Deleted

## 2018-07-02 DIAGNOSIS — Z1231 Encounter for screening mammogram for malignant neoplasm of breast: Secondary | ICD-10-CM

## 2018-07-06 NOTE — Progress Notes (Deleted)
[name], female   DOB: 04-21-1961, 57 y.o.   MRN: [id number]   Seen in ED 06/25/2018 for dental/tooth infection and BP med RF.  She was prescribed PCN and her BP refills and advised to f/up here.

## 2018-07-07 ENCOUNTER — Inpatient Hospital Stay: Payer: Self-pay

## 2018-07-26 ENCOUNTER — Other Ambulatory Visit: Payer: Self-pay | Admitting: Internal Medicine

## 2018-07-26 DIAGNOSIS — F4322 Adjustment disorder with anxiety: Secondary | ICD-10-CM

## 2018-08-06 ENCOUNTER — Ambulatory Visit: Payer: Self-pay

## 2018-08-12 ENCOUNTER — Encounter (HOSPITAL_COMMUNITY): Payer: Self-pay | Admitting: Student

## 2018-08-12 ENCOUNTER — Emergency Department (HOSPITAL_COMMUNITY)
Admission: EM | Admit: 2018-08-12 | Discharge: 2018-08-12 | Disposition: A | Discharge status: Home / Self Care | Payer: Self-pay | Attending: Emergency Medicine | Admitting: Emergency Medicine

## 2018-08-12 DIAGNOSIS — K0889 Other specified disorders of teeth and supporting structures: Secondary | ICD-10-CM | POA: Insufficient documentation

## 2018-08-12 DIAGNOSIS — I10 Essential (primary) hypertension: Secondary | ICD-10-CM | POA: Insufficient documentation

## 2018-08-12 DIAGNOSIS — Z79899 Other long term (current) drug therapy: Secondary | ICD-10-CM | POA: Insufficient documentation

## 2018-08-12 DIAGNOSIS — F17210 Nicotine dependence, cigarettes, uncomplicated: Secondary | ICD-10-CM | POA: Insufficient documentation

## 2018-08-12 MED ORDER — AMOXICILLIN-POT CLAVULANATE 875-125 MG PO TABS
1.0000 | ORAL_TABLET | Freq: Once | ORAL | Status: AC
  Administered 2018-08-12: 1 via ORAL
  Filled 2018-08-12: qty 1

## 2018-08-12 MED ORDER — LISINOPRIL-HYDROCHLOROTHIAZIDE 20-12.5 MG PO TABS: 1.0000 | ORAL_TABLET | Freq: Every day | ORAL | 0 refills | Status: DC

## 2018-08-12 MED ORDER — LISINOPRIL 20 MG PO TABS
20.0000 mg | ORAL_TABLET | Freq: Once | ORAL | Status: AC
  Administered 2018-08-12: 20 mg via ORAL
  Filled 2018-08-12: qty 1

## 2018-08-12 MED ORDER — AMLODIPINE BESYLATE 5 MG PO TABS: 5.0000 mg | ORAL_TABLET | Freq: Every day | ORAL | 0 refills | Status: DC

## 2018-08-12 MED ORDER — NAPROXEN 500 MG PO TABS: 500.0000 mg | ORAL_TABLET | Freq: Two times a day (BID) | ORAL | 0 refills | Status: DC

## 2018-08-12 MED ORDER — METOPROLOL SUCCINATE ER 50 MG PO TB24: ORAL_TABLET | ORAL | 0 refills | Status: DC

## 2018-08-12 MED ORDER — METOPROLOL SUCCINATE ER 25 MG PO TB24: 50.0000 mg | ORAL_TABLET | Freq: Every day | ORAL | Status: DC

## 2018-08-12 MED ORDER — AMOXICILLIN-POT CLAVULANATE 875-125 MG PO TABS: 1.0000 | ORAL_TABLET | Freq: Two times a day (BID) | ORAL | 0 refills | Status: DC

## 2018-08-12 MED ORDER — AMLODIPINE BESYLATE 5 MG PO TABS
5.0000 mg | ORAL_TABLET | Freq: Once | ORAL | Status: AC
  Administered 2018-08-12: 5 mg via ORAL
  Filled 2018-08-12: qty 1

## 2018-08-12 NOTE — Discharge Instructions (Signed)
Call one of the dentists offices provided to schedule an appointment for re-evaluation and further management within the next 48 hours.   I have prescribed you Augmentin which is an antibiotic to treat the infection and Naproxen which is an anti-inflammatory medicine to treat the pain.   Please take all of your antibiotics until finished. You may develop abdominal discomfort or diarrhea from the antibiotic.  You may help offset this with probiotics which you can buy at the store (ask your pharmacist if unable to find) or get probiotics in the form of eating yogurt. Do not eat or take the probiotics until 2 hours after your antibiotic. If you are unable to tolerate these side effects follow-up with your primary care provider or return to the emergency department.   If you begin to experience any blistering, rashes, swelling, or difficulty breathing seek medical care for evaluation of potentially more serious side effects.   Be sure to eat something when taking the Naproxen as it can cause stomach upset and at worst stomach bleeding. Do not take additional non steroidal anti-inflammatory medicines such as Ibuprofen, Aleve, Advil, Mobic, Diclofenac, or goodie powder while taking Naproxen. You may supplement with Tylenol.   We have prescribed you new medication(s) today. Discuss the medications prescribed today with your pharmacist as they can have adverse effects and interactions with your other medicines including over the counter and prescribed medications. Seek medical evaluation if you start to experience new or abnormal symptoms after taking one of these medicines, seek care immediately if you start to experience difficulty breathing, feeling of your throat closing, facial swelling, or rash as these could be indications of a more serious allergic reaction  If you start to experience and new or worsening symptoms return to the emergency department. If you start to experience fever, chills, neck  stiffness/pain, or inability to move your neck or open your mouth come back to the emergency department immediately.    Additionally your blood pressure was elevated in the emergency department today.  Please take your blood pressure medications as prescribed, we have given you 1 month refill of these.  Please follow-up closely with your primary care provider for recheck and further refills.  Vitals:   08/12/18 0901  BP: (!) 159/105  Pulse: 61  Resp: 16  SpO2: 94%

## 2018-08-12 NOTE — ED Notes (Signed)
Pt discharged from ED; instructions provided and scripts given; Pt encouraged to return to ED if symptoms worsen and to f/u with PCP; Pt verbalized understanding of all instructions 

## 2018-08-12 NOTE — ED Notes (Signed)
Pt upset wanting her BP meds prior to discharge, this RN spoke to the patient and provider and EDP agreed to order daily BP meds. Meds given, pt compliant and pleasant at discharge.

## 2018-08-12 NOTE — ED Triage Notes (Signed)
Pt c/o dental pain by 1 wk and hx of HTN and needs BP meds refilled

## 2018-08-12 NOTE — ED Provider Notes (Signed)
[facility] EMERGENCY DEPARTMENT Provider Note   CSN: [id number] Arrival date & time: 08/12/18  0847     History   Chief Complaint Chief Complaint  Patient presents with  . Dental Problem    HPI Jennifer [name] is a 58 y.o. female with a hx of bipolar 1 disorder, HTN, tobacco abuse, & polysubstance abuse who presents to the ED with complaints of left lower dental pain x 1 week. Pain is constant, 8/10 in severity, worse with eating, no alleviating factors. Hx of similar typically improves with abx. Cannot afford to see a dentist right now. Denies fever, chills, nausea, vomiting, swelling beneath the tongue, drooling, or intra-oral drainage.   She is also requesting refills of her Metoprolol, Norvasc, & Lisinopril. Ran out 1 week ago. Denies change in vision, dizziness, headache, chest pain, or dyspnea.   HPI  Past Medical History:  Diagnosis Date  . bipolar   . Bipolar 1 disorder ([place])   . Depression   . Fibroids    now s/p hysterectomy  . Hypertension   . Left ventricular hypertrophy   . Polysubstance abuse ([place])    IV drug [place], cocaine on occassion, smoking and alcoholism, has been going to AA, not completely sober    Patient Active Problem List   Diagnosis Date Noted  . Current smoker 08/12/2016  . Homeless single person 08/12/2016  . Skin lesion of left leg 03/29/2015  . Trigger middle finger of right hand 01/11/2015  . Discoloration of eye 01/11/2015  . Bradycardia 01/11/2015  . Skin lesion of scalp 07/17/2014  . Leg swelling 05/20/2013  . Vasomotor rhinitis 08/17/2012  . Right leg pain 07/26/2012  . Anxiety 02/26/2012  . GERD (gastroesophageal reflux disease) 08/08/2011  . Menopausal symptoms 07/23/2011  . Mastalgia 07/23/2011  . Bipolar disorder, current episode manic without psychotic features, moderate ([place]) 12/05/2010  . 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 SEBACEOUS CYST, SCALP 05/06/2010  . SYNOVIAL CYST 05/03/2010  . Abdominal pain  12/06/2009  . DRUG ABUSE, HX OF 12/06/2009  . Lipoma of shoulder 02/06/2009  . FIBROIDS, UTERUS 02/06/2009  . DRUG ABUSE 02/06/2009  . Essential hypertension, benign 02/06/2009  . PAIN IN JOINT, SHOULDER REGION 02/06/2009  . Insomnia 02/06/2009  . PELVIC MASS 02/06/2009    Past Surgical History:  Procedure Laterality Date  . ABDOMINAL HYSTERECTOMY     2003  . BILATERAL SALPINGOOPHORECTOMY     01/2010  . DILATION AND CURETTAGE OF UTERUS    . EXPLORATORY LAPAROTOMY     complex pelvic mass 2011  . TONSILLECTOMY       OB History    Gravida  5   Para  0   Term  0   Preterm  0   AB  5   Living  0     SAB  2   TAB  1   Ectopic  0   Multiple  0   Live Births               Home Medications    Prior to Admission medications   Medication Sig Start Date End Date Taking? Authorizing Provider  acetaminophen (TYLENOL) 500 MG tablet Take 1 tablet (500 mg total) by mouth every 8 (eight) hours as needed. Patient not taking: Reported on 08/28/2017 07/22/17   Ladell Pier, MD  amLODipine (NORVASC) 5 MG tablet Take 1 tablet (5 mg total) by mouth daily. 06/25/18   Jola Schmidt, MD  dicyclomine (BENTYL) 20 MG tablet  Take 1 tablet (20 mg total) by mouth 2 (two) times daily. 02/15/18   Jola Schmidt, MD  diphenoxylate-atropine (LOMOTIL) 2.5-0.025 MG tablet Take 1 tablet by mouth 4 (four) times daily as needed for diarrhea or loose stools. 02/03/18   Argentina Donovan, PA-C  lidocaine (XYLOCAINE) 2 % solution Use as directed 15 mLs in the mouth or throat as needed for mouth pain. 04/22/18   Khatri, Hina, PA-C  lisinopril-hydrochlorothiazide (PRINZIDE,ZESTORETIC) 20-12.5 MG tablet Take 1 tablet by mouth daily. 06/25/18   Jola Schmidt, MD  methocarbamol (ROBAXIN) 500 MG tablet Take 1 tablet (500 mg total) by mouth every 8 (eight) hours as needed for muscle spasms. 01/27/18   Charlott Rakes, MD  metoCLOPramide (REGLAN) 10 MG tablet Take 1 tablet (10 mg total) by mouth every 8  (eight) hours as needed for nausea. 02/15/18   Jola Schmidt, MD  metoprolol succinate (TOPROL-XL) 50 MG 24 hr tablet TAKE 1 TABLET BY MOUTH DAILY. TAKE WITH OR IMMEDIATELY FOLLOWING A MEAL. 06/25/18   Jola Schmidt, MD  naproxen (NAPROSYN) 500 MG tablet Take 0.5 tablets (250 mg total) by mouth 2 (two) times daily. 04/22/18   Khatri, Hina, PA-C  naproxen sodium (ALEVE) 220 MG tablet Take 440 mg by mouth 2 (two) times daily as needed (pain).     [provider]  ondansetron (ZOFRAN ODT) 8 MG disintegrating tablet Take 1 tablet (8 mg total) by mouth every 8 (eight) hours as needed for nausea or vomiting. 02/15/18   Jola Schmidt, MD  PARoxetine (PAXIL) 40 MG tablet Take 1 tablet (40 mg total) by mouth daily. MUST MAKE APPT FOR FURTHER REFILLS 07/27/18   Ladell Pier, MD  penicillin v potassium (VEETID) 500 MG tablet Take 1 tablet (500 mg total) by mouth 3 (three) times daily. 06/25/18   Jola Schmidt, MD    Family History Family History  Problem Relation Age of Onset  . Hypertension Mother   . Hypertension Sister   . Diabetes Father     Social History Social History   Tobacco Use  . Smoking status: Current Every Day Smoker    Packs/day: 0.50    Years: 20.00    Pack years: 10.00    Types: Cigarettes  . Smokeless tobacco: Never Used  . Tobacco comment: Wants to quit.   Substance Use Topics  . Alcohol use: No    Alcohol/week: 0.0 standard drinks  . Drug use: Not Currently    Types: Cocaine, Marijuana     Allergies   Patient has no known allergies.   Review of Systems Review of Systems  Constitutional: Negative for chills and fever.  HENT: Positive for dental problem. Negative for drooling, ear pain, trouble swallowing and voice change.   Respiratory: Negative for shortness of breath.   Cardiovascular: Negative for chest pain.  Neurological: Negative for weakness, numbness and headaches.     Physical Exam Updated Vital Signs BP (!) 159/105 (BP Location: Right  Arm)   Pulse 61   Temp 98.2 F (36.8 C) (Oral)   Resp 16   LMP 12/04/2009   SpO2 94%   Physical Exam Vitals signs and nursing note reviewed.  Constitutional:      General: She is not in acute distress.    Appearance: She is well-developed. She is not toxic-appearing.  HENT:     Head: Normocephalic and atraumatic.     Right Ear: Tympanic membrane is not perforated, erythematous, retracted or bulging.     Left Ear: Tympanic membrane is  not perforated, erythematous, retracted or bulging.     Nose: Nose normal.     Mouth/Throat:     Pharynx: Uvula midline. No oropharyngeal exudate, posterior oropharyngeal erythema or uvula swelling.     Tonsils: No tonsillar abscesses.      Comments: Posterior oropharynx is symmetric appearing. Patient tolerating own secretions without difficulty. No trismus. No drooling. No hot potato voice. No swelling beneath the tongue, submandibular compartment is soft.  Eyes:     General:        Right eye: No discharge.        Left eye: No discharge.     Conjunctiva/sclera: Conjunctivae normal.  Neck:     Musculoskeletal: Normal range of motion and neck supple.  Cardiovascular:     Rate and Rhythm: Normal rate and regular rhythm.  Pulmonary:     Effort: Pulmonary effort is normal.     Breath sounds: Normal breath sounds.  Lymphadenopathy:     Cervical: No cervical adenopathy.  Neurological:     Mental Status: She is alert.  Psychiatric:        Behavior: Behavior normal.        Thought Content: Thought content normal.      ED Treatments / Results  Labs (all labs ordered are listed, but only abnormal results are displayed) Labs Reviewed - No data to display  EKG None  Radiology No results found.  Procedures Procedures (including critical care time)  Medications Ordered in ED Medications - No data to display   Initial Impression / Assessment and Plan / ED Course  I have reviewed the triage vital signs and the nursing notes.  Pertinent  labs & imaging results that were available during my care of the patient were reviewed by me and considered in my medical decision making (see chart for details).    Patient presents with dental pain & HTN. Patient is nontoxic appearing. No gross abscess.  Exam unconcerning for Ludwig's angina or spread of infection.  Will treat with Augmentin and Naproxen.  Urged patient to follow-up with dentist, dental resources were provided.  BP elevated, off BP meds x 1 week, doubt HTN emergency, refilled medications- stressed importance of primary care follow up & management. Discussed treatment plan and need for follow up as well as return precautions. Provided opportunity for questions, patient confirmed understanding and is agreeable to plan.   Final Clinical Impressions(s) / ED Diagnoses   Final diagnoses:  Pain, dental  Hypertension, unspecified type    ED Discharge Orders         Ordered    amLODipine (NORVASC) 5 MG tablet  Daily     08/12/18 0859    lisinopril-hydrochlorothiazide (PRINZIDE,ZESTORETIC) 20-12.5 MG tablet  Daily    Note to Pharmacy:  Needs office visit for additional refills   08/12/18 0859    metoprolol succinate (TOPROL-XL) 50 MG 24 hr tablet     08/12/18 0859    amoxicillin-clavulanate (AUGMENTIN) 875-125 MG tablet  Every 12 hours     08/12/18 0859    naproxen (NAPROSYN) 500 MG tablet  2 times daily     08/12/18 0859           Amaryllis Dyke, PA-C 08/12/18 0908    Quintella Reichert, MD 08/13/18 [phone]

## 2018-08-16 MED FILL — LISINOPRIL-HCTZ 20-12.5 MG: 20-12.5 | 30 days supply | Qty: 30 | Fill #0

## 2018-08-16 MED FILL — AMLODIPINE BESYLATE 5 MG TA: 5 | 30 days supply | Qty: 30 | Fill #0

## 2018-08-16 MED FILL — METOPROLOL SUCCINATE ER 50: 50 | 30 days supply | Qty: 30 | Fill #0

## 2018-08-16 MED FILL — AMOX-CLAV 875-125 MG TABLET: 875-125 | 7 days supply | Qty: 14 | Fill #0

## 2018-08-25 ENCOUNTER — Ambulatory Visit: Payer: Self-pay | Admitting: Family Medicine

## 2018-09-27 ENCOUNTER — Emergency Department (HOSPITAL_COMMUNITY)
Admission: EM | Admit: 2018-09-27 | Discharge: 2018-09-27 | Disposition: A | Discharge status: Home / Self Care | Payer: Self-pay | Attending: Emergency Medicine | Admitting: Emergency Medicine

## 2018-09-27 ENCOUNTER — Encounter (HOSPITAL_COMMUNITY): Payer: Self-pay | Admitting: *Deleted

## 2018-09-27 DIAGNOSIS — F17210 Nicotine dependence, cigarettes, uncomplicated: Secondary | ICD-10-CM | POA: Insufficient documentation

## 2018-09-27 DIAGNOSIS — Z760 Encounter for issue of repeat prescription: Secondary | ICD-10-CM | POA: Insufficient documentation

## 2018-09-27 DIAGNOSIS — K029 Dental caries, unspecified: Secondary | ICD-10-CM | POA: Insufficient documentation

## 2018-09-27 DIAGNOSIS — I10 Essential (primary) hypertension: Secondary | ICD-10-CM | POA: Insufficient documentation

## 2018-09-27 DIAGNOSIS — Z79899 Other long term (current) drug therapy: Secondary | ICD-10-CM | POA: Insufficient documentation

## 2018-09-27 MED ORDER — LISINOPRIL 20 MG PO TABS
20.0000 mg | ORAL_TABLET | Freq: Once | ORAL | Status: AC
  Administered 2018-09-27: 20 mg via ORAL
  Filled 2018-09-27: qty 1

## 2018-09-27 MED ORDER — AMLODIPINE BESYLATE 5 MG PO TABS: 5.0000 mg | ORAL_TABLET | Freq: Every day | ORAL | 0 refills | Status: DC

## 2018-09-27 MED ORDER — METOPROLOL SUCCINATE ER 50 MG PO TB24: ORAL_TABLET | ORAL | 0 refills | Status: DC

## 2018-09-27 MED ORDER — NAPROXEN 250 MG PO TABS
500.0000 mg | ORAL_TABLET | Freq: Once | ORAL | Status: AC
  Administered 2018-09-27: 500 mg via ORAL
  Filled 2018-09-27: qty 2

## 2018-09-27 MED ORDER — LISINOPRIL-HYDROCHLOROTHIAZIDE 20-12.5 MG PO TABS: 1.0000 | ORAL_TABLET | Freq: Every day | ORAL | 0 refills | Status: DC

## 2018-09-27 MED ORDER — AMLODIPINE BESYLATE 5 MG PO TABS
5.0000 mg | ORAL_TABLET | Freq: Once | ORAL | Status: AC
  Administered 2018-09-27: 5 mg via ORAL
  Filled 2018-09-27: qty 1

## 2018-09-27 MED ORDER — METOPROLOL SUCCINATE ER 25 MG PO TB24
50.0000 mg | ORAL_TABLET | Freq: Every day | ORAL | Status: DC
  Administered 2018-09-27: 50 mg via ORAL
  Filled 2018-09-27: qty 2

## 2018-09-27 NOTE — Discharge Instructions (Addendum)
The emergency department is NOT the appropriate place to have your blood pressure medications managed. You will need to schedule an appointment with your primary care provider in order to obtain any more refills of your blood pressure medication. Schedule an appointment as soon as possible. Schedule an appointment with a dentist for your ongoing dental pain. Continue taking over the counter medications as needed for pain. At this time, we do not recommend another antibiotic.

## 2018-09-27 NOTE — ED Provider Notes (Signed)
[name] Provider Note   CSN: [id number] Arrival date & time: 09/27/18  5638     History   Chief Complaint Chief Complaint  Patient presents with  . Hypertension    HPI Jennifer [name] is a 58 y.o. female with past medical history of hypertension, polysubstance abuse, bipolar 1 disorder, presenting to the emergency department with quest for medication refill of her hypertensive medications.  Patient states she has not seen her PCP, and ran out of her medications a few days ago.  No headache or dizziness, no chest pain.  Patient also with second complaint of left lower dental pain, that began hurting a few days ago.  No fevers or difficulty swallowing, or fever. patient has been treating with naproxen..  Patient has been evaluated multiple times, including a visit in November 2019 and January 2020 for the same complaints, requesting hypertension medication refills and w complaint of dental pain.  Patient has not yet followed up with a dentist.   The history is provided by the patient and medical records.    Past Medical History:  Diagnosis Date  . bipolar   . Bipolar 1 disorder ([place])   . Depression   . Fibroids    now s/p hysterectomy  . Hypertension   . Left ventricular hypertrophy   . Polysubstance abuse ([place])    IV drug [place], cocaine on occassion, smoking and alcoholism, has been going to AA, not completely sober    Patient Active Problem List   Diagnosis Date Noted  . Current smoker 08/12/2016  . Homeless single person 08/12/2016  . Skin lesion of left leg 03/29/2015  . Trigger middle finger of right hand 01/11/2015  . Discoloration of eye 01/11/2015  . Bradycardia 01/11/2015  . Skin lesion of scalp 07/17/2014  . Leg swelling 05/20/2013  . Vasomotor rhinitis 08/17/2012  . Right leg pain 07/26/2012  . Anxiety 02/26/2012  . GERD (gastroesophageal reflux disease) 08/08/2011  . Menopausal symptoms 07/23/2011  . Mastalgia  07/23/2011  . Bipolar disorder, current episode manic without psychotic features, moderate ([place]) 12/05/2010  . 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 SEBACEOUS CYST, SCALP 05/06/2010  . SYNOVIAL CYST 05/03/2010  . Abdominal pain 12/06/2009  . DRUG ABUSE, HX OF 12/06/2009  . Lipoma of shoulder 02/06/2009  . FIBROIDS, UTERUS 02/06/2009  . DRUG ABUSE 02/06/2009  . Essential hypertension, benign 02/06/2009  . PAIN IN JOINT, SHOULDER REGION 02/06/2009  . Insomnia 02/06/2009  . PELVIC MASS 02/06/2009    Past Surgical History:  Procedure Laterality Date  . ABDOMINAL HYSTERECTOMY     2003  . BILATERAL SALPINGOOPHORECTOMY     01/2010  . DILATION AND CURETTAGE OF UTERUS    . EXPLORATORY LAPAROTOMY     complex pelvic mass 2011  . TONSILLECTOMY       OB History    Gravida  5   Para  0   Term  0   Preterm  0   AB  5   Living  0     SAB  2   TAB  1   Ectopic  0   Multiple  0   Live Births               Home Medications    Prior to Admission medications   Medication Sig Start Date End Date Taking? Authorizing Provider  amLODipine (NORVASC) 5 MG tablet Take 1 tablet (5 mg total) by mouth daily. 09/27/18   Adelei Scobey, [place] N, PA-C  amoxicillin-clavulanate (AUGMENTIN) 875-125 MG tablet Take 1 tablet by mouth every 12 (twelve) hours. 08/12/18   Petrucelli, Samantha R, PA-C  dicyclomine (BENTYL) 20 MG tablet Take 1 tablet (20 mg total) by mouth 2 (two) times daily. 02/15/18   Jola Schmidt, MD  diphenoxylate-atropine (LOMOTIL) 2.5-0.025 MG tablet Take 1 tablet by mouth 4 (four) times daily as needed for diarrhea or loose stools. 02/03/18   Argentina Donovan, PA-C  lidocaine (XYLOCAINE) 2 % solution Use as directed 15 mLs in the mouth or throat as needed for mouth pain. 04/22/18   Khatri, Hina, PA-C  lisinopril-hydrochlorothiazide (PRINZIDE,ZESTORETIC) 20-12.5 MG tablet Take 1 tablet by mouth daily. 09/27/18   Juli Odom, [place] N, PA-C  methocarbamol (ROBAXIN) 500 MG tablet Take 1  tablet (500 mg total) by mouth every 8 (eight) hours as needed for muscle spasms. 01/27/18   Charlott Rakes, MD  metoCLOPramide (REGLAN) 10 MG tablet Take 1 tablet (10 mg total) by mouth every 8 (eight) hours as needed for nausea. 02/15/18   Jola Schmidt, MD  metoprolol succinate (TOPROL-XL) 50 MG 24 hr tablet TAKE 1 TABLET BY MOUTH DAILY. TAKE WITH OR IMMEDIATELY FOLLOWING A MEAL. 09/27/18   Caitlan Chauca, [place] N, PA-C  naproxen (NAPROSYN) 500 MG tablet Take 1 tablet (500 mg total) by mouth 2 (two) times daily. 08/12/18   Petrucelli, Samantha R, PA-C  naproxen sodium (ALEVE) 220 MG tablet Take 440 mg by mouth 2 (two) times daily as needed (pain).     [provider]  ondansetron (ZOFRAN ODT) 8 MG disintegrating tablet Take 1 tablet (8 mg total) by mouth every 8 (eight) hours as needed for nausea or vomiting. 02/15/18   Jola Schmidt, MD  PARoxetine (PAXIL) 40 MG tablet Take 1 tablet (40 mg total) by mouth daily. MUST MAKE APPT FOR FURTHER REFILLS 07/27/18   Ladell Pier, MD  penicillin v potassium (VEETID) 500 MG tablet Take 1 tablet (500 mg total) by mouth 3 (three) times daily. 06/25/18   Jola Schmidt, MD    Family History Family History  Problem Relation Age of Onset  . Hypertension Mother   . Hypertension Sister   . Diabetes Father     Social History Social History   Tobacco Use  . Smoking status: Current Every Day Smoker    Packs/day: 0.50    Years: 20.00    Pack years: 10.00    Types: Cigarettes  . Smokeless tobacco: Never Used  . Tobacco comment: Wants to quit.   Substance Use Topics  . Alcohol use: No    Alcohol/week: 0.0 standard drinks  . Drug use: Not Currently    Types: Cocaine, Marijuana     Allergies   Patient has no known allergies.   Review of Systems Review of Systems  HENT: Positive for dental problem. Negative for trouble swallowing.   Eyes: Negative for visual disturbance.  Respiratory: Negative for stridor.   Cardiovascular: Negative for  chest pain.  Neurological: Negative for dizziness and headaches.     Physical Exam Updated Vital Signs BP (!) 159/110 (BP Location: Right Arm)   Pulse 60   Temp 98.6 F (37 C) (Oral)   Resp 18   LMP 12/04/2009   SpO2 97%   Physical Exam Vitals signs and nursing note reviewed.  Constitutional:      Appearance: She is well-developed.  HENT:     Head: Normocephalic and atraumatic.     Mouth/Throat:     Comments: Patient throughout.  Left lower molar with decay  to the gingiva.  No significant surrounding gingival erythema.  No fluctuance or gingival abscess.  No sublingual edema or tenderness.  Uvula is midline, tolerating secretions.  No cervical adenopathy. Eyes:     Extraocular Movements: Extraocular movements intact.     Conjunctiva/sclera: Conjunctivae normal.     Pupils: Pupils are equal, round, and reactive to light.  Neck:     Musculoskeletal: Normal range of motion and neck supple. No neck rigidity.  Cardiovascular:     Rate and Rhythm: Normal rate and regular rhythm.  Pulmonary:     Effort: Pulmonary effort is normal.     Breath sounds: Normal breath sounds.  Lymphadenopathy:     Cervical: No cervical adenopathy.  Neurological:     Mental Status: She is alert.  Psychiatric:        Mood and Affect: Mood normal.        Behavior: Behavior normal.      ED Treatments / Results  Labs (all labs ordered are listed, but only abnormal results are displayed) Labs Reviewed - No data to display  EKG None  Radiology No results found.  Procedures Procedures (including critical care time)  Medications Ordered in ED Medications  metoprolol succinate (TOPROL-XL) 24 hr tablet 50 mg (50 mg Oral Given 09/27/18 1014)  amLODipine (NORVASC) tablet 5 mg (5 mg Oral Given 09/27/18 1014)  lisinopril (PRINIVIL,ZESTRIL) tablet 20 mg (20 mg Oral Given 09/27/18 1014)  naproxen (NAPROSYN) tablet 500 mg (500 mg Oral Given 09/27/18 1014)     Initial Impression / Assessment and Plan  / ED Course  I have reviewed the triage vital signs and the nursing notes.  Pertinent labs & imaging results that were available during my care of the patient were reviewed by me and considered in my medical decision making (see chart for details).     Patient presenting recurrent request for hypertension medication refill as well as left lower dental pain.  Evaluated in November 2019 and January 2020 with similar complaints.  Patient has not yet followed up with a dentist nor has she scheduled a follow-up appointment her primary care provider for management of her blood pressure medications.  No red flags or symptoms of endorgan damage today.  No fevers.  Tolerating secretions.  Exam is not concerning for deep space infection or Ludwig's angina.  No abscess.  Had long discussion with patient regarding importance of follow-up with primary care provider for management of blood pressure medications.  Instructed patient that the emergency department is not the appropriate place for blood pressure medication management as it is difficult to adjust medications as needed given lack of follow-up.  Will give a 1 week refill of her blood pressure medications and stressed follow-up with PCP.  At this time do not recommend repeat course of antibiotics for dental pain.  Stressed follow-up with dentist.  No distress and safe for discharge.  Care plan discussed with Dr. Ellender Hose.  Discussed results, findings, treatment and follow up. Patient advised of return precautions. Patient verbalized understanding and agreed with plan.   Final Clinical Impressions(s) / ED Diagnoses   Final diagnoses:  Pain due to dental caries  Hypertension, unspecified type  Encounter for medication refill    ED Discharge Orders         Ordered    amLODipine (NORVASC) 5 MG tablet  Daily     09/27/18 1005    lisinopril-hydrochlorothiazide (PRINZIDE,ZESTORETIC) 20-12.5 MG tablet  Daily    Note to Pharmacy:  Needs office  visit for  additional refills   09/27/18 1005    metoprolol succinate (TOPROL-XL) 50 MG 24 hr tablet     09/27/18 1005           Rahim Astorga, [place] N, [place] 09/27/18 1038    Duffy Bruce, MD 09/27/18 [phone]

## 2018-09-27 NOTE — ED Triage Notes (Signed)
Pt in c/o hypertension, states she is out of her medication, denies headaches or dizziness, no distress noted

## 2018-09-28 ENCOUNTER — Encounter (HOSPITAL_COMMUNITY): Payer: Self-pay

## 2018-09-28 ENCOUNTER — Ambulatory Visit
Admission: RE | Admit: 2018-09-28 | Discharge: 2018-09-28 | Disposition: A | Discharge status: Home / Self Care | Payer: No Typology Code available for payment source | Source: Ambulatory Visit | Attending: Obstetrics and Gynecology | Admitting: Obstetrics and Gynecology

## 2018-09-28 ENCOUNTER — Ambulatory Visit (HOSPITAL_COMMUNITY)
Admission: RE | Admit: 2018-09-28 | Discharge: 2018-09-28 | Disposition: A | Discharge status: Home / Self Care | Payer: Self-pay | Source: Ambulatory Visit | Attending: Obstetrics and Gynecology | Admitting: Obstetrics and Gynecology

## 2018-09-28 ENCOUNTER — Ambulatory Visit (HOSPITAL_COMMUNITY): Payer: Self-pay

## 2018-09-28 VITALS — BP 122/84 | Wt 194.0 lb

## 2018-09-28 DIAGNOSIS — Z1239 Encounter for other screening for malignant neoplasm of breast: Secondary | ICD-10-CM

## 2018-09-28 DIAGNOSIS — Z1231 Encounter for screening mammogram for malignant neoplasm of breast: Secondary | ICD-10-CM

## 2018-09-28 NOTE — Patient Instructions (Signed)
Explained breast self awareness with Jennifer [name]. Patient did not need a Pap smear today due to patient has a history of a hysterectomy for benign reasons. Let patient know that she doesn't need any further Pap smears due to her history of a hysterectomy for benign reasons. Referred patient to the [facility] for a screening mammogram. Appointment scheduled for Tuesday, September 28, 2018 at 1240. Patient aware of appointment and will be there. Let patient know the [facility] will follow up with her within the next couple weeks with results of mammogram by letter or phone. Discussed smoking cessation with patient. Referred to the [facility] Quitline and gave resources to the free smoking cessation classes at [facility]. Jennifer [name] verbalized understanding.  [name], Arvil Chaco, RN 1:02 PM

## 2018-09-28 NOTE — Progress Notes (Signed)
No complaints today.   Pap Smear: Pap smear not completed today. Last Pap smear was 03/08/2002 and normal. Per patient has no history of an abnormal Pap smear. Patient has a history of a hysterectomy 04/13/2002 due to uterine fibroids. Patient doesn't need any further Pap smears due to her history of a hysterectomy for benign reasons per BCCCP and ACOG guidelines. Last Pap smear result is in Epic.  Physical exam: Breasts Breasts symmetrical. No skin abnormalities bilateral breasts. No nipple retraction bilateral breasts. No nipple discharge bilateral breasts. No lymphadenopathy. No lumps palpated bilateral breasts. No complaints of pain or tenderness on exam. Referred patient to the [facility] for a screening mammogram. Appointment scheduled for Tuesday, September 28, 2018 at 1240.        Pelvic/Bimanual No Pap smear completed today since patient has a history of a hysterectomy for benign reasons. Pap smear not indicated per BCCCP guidelines.   Smoking History: Patient is a current smoker. Discussed smoking cessation with patient. Referred to the [facility] Quitline and gave resources to the free smoking cessation classes at [facility].  Patient Navigation: Patient education provided. Access to services provided for patient through [place] program.   Colorectal Cancer Screening: Per patient has never had a colonoscopy completed. No complaints today. FIT Test given to patient to complete and return to BCCCP.  Breast and Cervical Cancer Risk Assessment: Patient has a family history of a paternal aunt having breast cancer. Patient has no known genetic mutations or history of radiation treatment to the chest before age 54. Patient has no history of cervical dysplasia, immunocompromised, or DES exposure in-utero.  Risk Assessment    Risk Scores      09/28/2018   Last edited by: Armond Hang, LPN   5-year risk: 1.4 %   Lifetime risk: 7.8 %

## 2018-10-01 ENCOUNTER — Encounter (HOSPITAL_COMMUNITY): Payer: Self-pay | Admitting: *Deleted

## 2018-10-03 ENCOUNTER — Other Ambulatory Visit: Payer: Self-pay

## 2018-10-05 ENCOUNTER — Emergency Department (HOSPITAL_COMMUNITY)
Admission: EM | Admit: 2018-10-05 | Discharge: 2018-10-05 | Disposition: A | Discharge status: Home / Self Care | Payer: No Typology Code available for payment source | Attending: Emergency Medicine | Admitting: Emergency Medicine

## 2018-10-05 ENCOUNTER — Encounter (HOSPITAL_COMMUNITY): Payer: Self-pay | Admitting: Emergency Medicine

## 2018-10-05 DIAGNOSIS — K029 Dental caries, unspecified: Secondary | ICD-10-CM | POA: Insufficient documentation

## 2018-10-05 DIAGNOSIS — F17210 Nicotine dependence, cigarettes, uncomplicated: Secondary | ICD-10-CM | POA: Insufficient documentation

## 2018-10-05 DIAGNOSIS — K047 Periapical abscess without sinus: Secondary | ICD-10-CM

## 2018-10-05 DIAGNOSIS — K0889 Other specified disorders of teeth and supporting structures: Secondary | ICD-10-CM

## 2018-10-05 DIAGNOSIS — I10 Essential (primary) hypertension: Secondary | ICD-10-CM | POA: Insufficient documentation

## 2018-10-05 MED ORDER — OXYCODONE-ACETAMINOPHEN 5-325 MG PO TABS
1.0000 | ORAL_TABLET | Freq: Once | ORAL | Status: AC
  Administered 2018-10-05: 1 via ORAL
  Filled 2018-10-05: qty 1

## 2018-10-05 MED ORDER — AMOXICILLIN 500 MG PO CAPS: 500.0000 mg | ORAL_CAPSULE | Freq: Three times a day (TID) | ORAL | 0 refills | Status: DC

## 2018-10-05 MED ORDER — AMOXICILLIN 500 MG PO CAPS
500.0000 mg | ORAL_CAPSULE | Freq: Once | ORAL | Status: AC
  Administered 2018-10-05: 500 mg via ORAL
  Filled 2018-10-05: qty 1

## 2018-10-05 MED ORDER — NAPROXEN 375 MG PO TABS: 375.0000 mg | ORAL_TABLET | Freq: Two times a day (BID) | ORAL | 0 refills | Status: DC

## 2018-10-05 MED FILL — AMOXICILLIN 500 MG CAPSULE: 500 | 10 days supply | Qty: 30 | Fill #0

## 2018-10-05 NOTE — ED Triage Notes (Signed)
Per pt, states right upper dental pain for a couple of weeks-sates she was seen for same symptoms in January-unable to afford a dentist-states she needs meds to get rid of the infection

## 2018-10-05 NOTE — ED Provider Notes (Signed)
[facility] DEPT Provider Note   CSN: [id number] Arrival date & time: 10/05/18  1232    History   Chief Complaint Chief Complaint  Patient presents with  . Dental Pain    HPI Jennifer [name] is a 57 y.o. female every day smoker, HTN, polysubstance abuse and homeless who presents to the ED with dental paint that started 2 weeks ago and has been persistent. The pain is located in the right upper dental area. Patient reports she can not afford a dentist. Patient requesting medication for infection.      HPI  Past Medical History:  Diagnosis Date  . bipolar   . Bipolar 1 disorder ([place])   . Depression   . Fibroids    now s/p hysterectomy  . Hypertension   . Left ventricular hypertrophy   . Polysubstance abuse ([place])    IV drug [place], cocaine on occassion, smoking and alcoholism, has been going to AA, not completely sober    Patient Active Problem List   Diagnosis Date Noted  . Current smoker 08/12/2016  . Homeless single person 08/12/2016  . Skin lesion of left leg 03/29/2015  . Trigger middle finger of right hand 01/11/2015  . Discoloration of eye 01/11/2015  . Bradycardia 01/11/2015  . Skin lesion of scalp 07/17/2014  . Leg swelling 05/20/2013  . Vasomotor rhinitis 08/17/2012  . Right leg pain 07/26/2012  . Anxiety 02/26/2012  . GERD (gastroesophageal reflux disease) 08/08/2011  . Menopausal symptoms 07/23/2011  . Mastalgia 07/23/2011  . Bipolar disorder, current episode manic without psychotic features, moderate ([place]) 12/05/2010  . 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 SEBACEOUS CYST, SCALP 05/06/2010  . SYNOVIAL CYST 05/03/2010  . Abdominal pain 12/06/2009  . DRUG ABUSE, HX OF 12/06/2009  . Lipoma of shoulder 02/06/2009  . FIBROIDS, UTERUS 02/06/2009  . DRUG ABUSE 02/06/2009  . Essential hypertension, benign 02/06/2009  . PAIN IN JOINT, SHOULDER REGION 02/06/2009  . Insomnia 02/06/2009  . PELVIC MASS 02/06/2009    Past Surgical  History:  Procedure Laterality Date  . ABDOMINAL HYSTERECTOMY     2003  . BILATERAL SALPINGOOPHORECTOMY     01/2010  . DILATION AND CURETTAGE OF UTERUS    . EXPLORATORY LAPAROTOMY     complex pelvic mass 2011  . TONSILLECTOMY       OB History    Gravida  5   Para  0   Term  0   Preterm  0   AB  5   Living  0     SAB  2   TAB  1   Ectopic  0   Multiple  0   Live Births               Home Medications    Prior to Admission medications   Medication Sig Start Date End Date Taking? Authorizing Provider  amLODipine (NORVASC) 5 MG tablet Take 1 tablet (5 mg total) by mouth daily. Patient not taking: Reported on 09/28/2018 09/27/18   Robinson, [place] N, PA-C  amoxicillin (AMOXIL) 500 MG capsule Take 1 capsule (500 mg total) by mouth 3 (three) times daily. 10/05/18   Ashley Murrain, NP  dicyclomine (BENTYL) 20 MG tablet Take 1 tablet (20 mg total) by mouth 2 (two) times daily. Patient not taking: Reported on 09/28/2018 02/15/18   Jola Schmidt, MD  diphenoxylate-atropine (LOMOTIL) 2.5-0.025 MG tablet Take 1 tablet by mouth 4 (four) times daily as needed for diarrhea or loose stools. Patient not  taking: Reported on 09/28/2018 02/03/18   Argentina Donovan, PA-C  lidocaine (XYLOCAINE) 2 % solution Use as directed 15 mLs in the mouth or throat as needed for mouth pain. Patient not taking: Reported on 09/28/2018 04/22/18   Delia Heady, PA-C  lisinopril-hydrochlorothiazide (PRINZIDE,ZESTORETIC) 20-12.5 MG tablet Take 1 tablet by mouth daily. Patient not taking: Reported on 09/28/2018 09/27/18   Robinson, [place] N, PA-C  methocarbamol (ROBAXIN) 500 MG tablet Take 1 tablet (500 mg total) by mouth every 8 (eight) hours as needed for muscle spasms. Patient not taking: Reported on 09/28/2018 01/27/18   Charlott Rakes, MD  metoCLOPramide (REGLAN) 10 MG tablet Take 1 tablet (10 mg total) by mouth every 8 (eight) hours as needed for nausea. Patient not taking: Reported on 09/28/2018 02/15/18    Jola Schmidt, MD  metoprolol succinate (TOPROL-XL) 50 MG 24 hr tablet TAKE 1 TABLET BY MOUTH DAILY. TAKE WITH OR IMMEDIATELY FOLLOWING A MEAL. Patient not taking: Reported on 09/28/2018 09/27/18   Robinson, [place] N, PA-C  naproxen (NAPROSYN) 375 MG tablet Take 1 tablet (375 mg total) by mouth 2 (two) times daily. 10/05/18   Ashley Murrain, NP  ondansetron (ZOFRAN ODT) 8 MG disintegrating tablet Take 1 tablet (8 mg total) by mouth every 8 (eight) hours as needed for nausea or vomiting. Patient not taking: Reported on 09/28/2018 02/15/18   Jola Schmidt, MD  PARoxetine (PAXIL) 40 MG tablet Take 1 tablet (40 mg total) by mouth daily. MUST MAKE APPT FOR FURTHER REFILLS Patient not taking: Reported on 09/28/2018 07/27/18   Ladell Pier, MD    Family History Family History  Problem Relation Age of Onset  . Hypertension Mother   . Hypertension Sister   . Diabetes Father     Social History Social History   Tobacco Use  . Smoking status: Current Every Day Smoker    Packs/day: 0.50    Years: 20.00    Pack years: 10.00    Types: Cigarettes  . Smokeless tobacco: Never Used  . Tobacco comment: Wants to quit.   Substance Use Topics  . Alcohol use: Yes    Alcohol/week: 0.0 standard drinks  . Drug use: Not Currently    Types: Cocaine, Marijuana     Allergies   Patient has no known allergies.   Review of Systems Review of Systems  Constitutional: Negative for chills and fever.  HENT: Positive for dental problem and facial swelling. Negative for sore throat.   Respiratory: Negative for cough.   Gastrointestinal: Negative for abdominal pain, nausea and vomiting.  Skin: Negative for rash.  Neurological: Negative for headaches.  Psychiatric/Behavioral: Negative for confusion.     Physical Exam Updated Vital Signs BP (!) 142/99 (BP Location: Left Arm)   Pulse 81   Temp 98 F (36.7 C) (Oral)   Resp 17   Ht 5\' 4"  (1.626 m)   Wt 86.2 kg   LMP 12/04/2009   SpO2 96%   BMI 32.61  kg/m   Physical Exam Vitals signs and nursing note reviewed.  Constitutional:      General: She is not in acute distress.    Appearance: She is well-developed.  HENT:     Head: Normocephalic.     Right Ear: Tympanic membrane normal.     Left Ear: Tympanic membrane normal.     Nose: Nose normal.     Mouth/Throat:     Mouth: Mucous membranes are moist.     Dentition: Dental caries present.  Pharynx: Oropharynx is clear.      Comments: Right lower second molar with decay to the gumline with swelling of the gum surrounding the gum. Tender on exam.  Eyes:     Extraocular Movements: Extraocular movements intact.     Conjunctiva/sclera: Conjunctivae normal.  Neck:     Musculoskeletal: Neck supple.  Cardiovascular:     Rate and Rhythm: Normal rate.  Pulmonary:     Effort: Pulmonary effort is normal.  Musculoskeletal: Normal range of motion.  Skin:    General: Skin is warm and dry.  Neurological:     Mental Status: She is alert and oriented to person, place, and time.  Psychiatric:        Mood and Affect: Mood normal.      ED Treatments / Results  Labs (all labs ordered are listed, but only abnormal results are displayed) Labs Reviewed - No data to display  Radiology No results found.  Procedures Procedures (including critical care time)  Medications Ordered in ED Medications  oxyCODONE-acetaminophen (PERCOCET/ROXICET) 5-325 MG per tablet 1 tablet (has no administration in time range)  amoxicillin (AMOXIL) capsule 500 mg (has no administration in time range)     Initial Impression / Assessment and Plan / ED Course  I have reviewed the triage vital signs and the nursing notes. Patient with toothache.  No gross abscess.  Exam unconcerning for Ludwig's angina or spread of infection.  Will treat with penicillin and anti-inflammatories medicine.  Urged patient to follow-up with dentist.  Also encouraged patient to pick up her BP medication that she reports was  prescribed for her last week.   Final Clinical Impressions(s) / ED Diagnoses   Final diagnoses:  Infected dental caries  Tooth pain    ED Discharge Orders         Ordered    amoxicillin (AMOXIL) 500 MG capsule  3 times daily     10/05/18 1446    naproxen (NAPROSYN) 375 MG tablet  2 times daily     10/05/18 1446           Janit Bern Norton, [name] 10/05/18 1450    Hayden Rasmussen, MD 10/06/18 [phone]

## 2018-10-05 NOTE — Discharge Instructions (Addendum)
Take tylenol in addition to the other medications as needed. Follow up with a dentist as soon as possible. Get your blood pressure medication that you were prescribed last week filled.

## 2018-10-07 ENCOUNTER — Ambulatory Visit: Payer: Self-pay | Attending: Family Medicine | Admitting: Emergency Medicine

## 2018-10-07 ENCOUNTER — Other Ambulatory Visit: Payer: Self-pay

## 2018-10-07 VITALS — BP 151/96 | HR 76 | Temp 97.6°F | Resp 18

## 2018-10-07 DIAGNOSIS — Z9114 Patient's other noncompliance with medication regimen: Secondary | ICD-10-CM

## 2018-10-07 DIAGNOSIS — I10 Essential (primary) hypertension: Secondary | ICD-10-CM

## 2018-10-07 DIAGNOSIS — Z0130 Encounter for examination of blood pressure without abnormal findings: Secondary | ICD-10-CM

## 2018-10-07 MED FILL — LISINOPRIL-HCTZ 20-12.5 MG: 20-12.5 | 7 days supply | Qty: 7 | Fill #0

## 2018-10-07 MED FILL — METOPROLOL SUCCINATE ER 50: 50 | 5 days supply | Qty: 5 | Fill #0

## 2018-10-07 MED FILL — PARoxetine HCL 40 MG TABS: 40 | 30 days supply | Qty: 30 | Fill #0

## 2018-10-07 MED FILL — AMLODIPINE BESYLATE 5 MG TA: 5 | 7 days supply | Qty: 7 | Fill #0

## 2018-10-07 NOTE — Progress Notes (Deleted)
Patient arrived alert and orientated to clinic with a a request to take her blood pressure.  Patient was also here to pick up her medications.  Patient states she has been none compliant with her hypertension medications and has even been taking unknown hypertension medication. Patient was hypertension in clinic but had been off her medication for two weeks. Patient was educated about hypertension and how medications works to lower it.  Patient was tearful at the prospect that by not being on her medication she was doing damage to her heart and that it possibly could shortened her life.  Patient was also educated on how diet and water consumption effected her blood pressure.  Patient was was was told that if she was compliant with medications and lifestyle choices that she could live a long and healthy life.   p

## 2018-10-07 NOTE — Progress Notes (Signed)
Patient arrived alert and orientated to clinic with a a request to take her blood pressure.  Patient was also here to pick up her medications.  Patient states she has been none compliant with her hypertension medications and has even been taking unknown hypertension medication. Patient was hypertension in clinic but had been off her medication for two weeks. Patient was educated about hypertension and how medications works to lower it.  Patient was tearful at the prospect that by not being on her medication she was doing damage to her heart and that it possibly could shortened her life.  Patient was also educated on how diet and water consumption effected her blood pressure.  Patient was was was told that if she was compliant with medications and lifestyle choices that she could live a long and healthy life.

## 2018-10-12 ENCOUNTER — Ambulatory Visit: Payer: No Typology Code available for payment source | Admitting: Internal Medicine

## 2018-10-13 ENCOUNTER — Telehealth: Payer: Self-pay | Admitting: *Deleted

## 2018-10-13 NOTE — Telephone Encounter (Signed)
Patient no showed for their most recent appointment 10/12/2018. Please ask if there were any barriers causing the no show and offer the next available appointment.

## 2018-11-01 ENCOUNTER — Telehealth: Payer: Self-pay

## 2018-11-01 NOTE — Progress Notes (Deleted)
   Subjective:    [name], female    DOB: 17-Nov-1960, 58 y.o.   MRN: [id number]  58 y.o.F here for eval of cyst on arm.  Hx HTN, GERD     Review of Systems     Objective:   Physical Exam        Assessment & Plan:

## 2018-11-01 NOTE — Telephone Encounter (Signed)
Left message with patient informing them that the lung cancer screening that they are signed up for on Monday, March 30th is cancelled because of the [id number]. Screening will be rescheduled for a later date.

## 2018-11-02 ENCOUNTER — Ambulatory Visit: Payer: No Typology Code available for payment source | Admitting: Critical Care Medicine

## 2018-11-06 LAB — FECAL OCCULT BLOOD, IMMUNOCHEMICAL

## 2018-11-08 ENCOUNTER — Ambulatory Visit: Payer: Self-pay

## 2018-12-03 ENCOUNTER — Encounter (HOSPITAL_COMMUNITY): Payer: Self-pay | Admitting: Emergency Medicine

## 2018-12-03 ENCOUNTER — Emergency Department (HOSPITAL_COMMUNITY)
Admission: EM | Admit: 2018-12-03 | Discharge: 2018-12-03 | Disposition: A | Discharge status: Home / Self Care | Payer: Self-pay | Attending: Emergency Medicine | Admitting: Emergency Medicine

## 2018-12-03 ENCOUNTER — Other Ambulatory Visit: Payer: Self-pay

## 2018-12-03 DIAGNOSIS — R590 Localized enlarged lymph nodes: Secondary | ICD-10-CM | POA: Insufficient documentation

## 2018-12-03 DIAGNOSIS — F319 Bipolar disorder, unspecified: Secondary | ICD-10-CM | POA: Insufficient documentation

## 2018-12-03 DIAGNOSIS — Z5900 Homelessness unspecified: Secondary | ICD-10-CM

## 2018-12-03 DIAGNOSIS — K029 Dental caries, unspecified: Secondary | ICD-10-CM | POA: Insufficient documentation

## 2018-12-03 DIAGNOSIS — K089 Disorder of teeth and supporting structures, unspecified: Secondary | ICD-10-CM

## 2018-12-03 DIAGNOSIS — G8929 Other chronic pain: Secondary | ICD-10-CM | POA: Insufficient documentation

## 2018-12-03 DIAGNOSIS — Z760 Encounter for issue of repeat prescription: Secondary | ICD-10-CM | POA: Insufficient documentation

## 2018-12-03 DIAGNOSIS — F17210 Nicotine dependence, cigarettes, uncomplicated: Secondary | ICD-10-CM | POA: Insufficient documentation

## 2018-12-03 DIAGNOSIS — I10 Essential (primary) hypertension: Secondary | ICD-10-CM | POA: Insufficient documentation

## 2018-12-03 MED ORDER — AMLODIPINE BESYLATE 5 MG PO TABS: 5.0000 mg | ORAL_TABLET | Freq: Every day | ORAL | 0 refills | Status: DC

## 2018-12-03 MED ORDER — AMOXICILLIN-POT CLAVULANATE 875-125 MG PO TABS
1.0000 | ORAL_TABLET | Freq: Once | ORAL | Status: AC
  Administered 2018-12-03: 12:00:00 1 via ORAL
  Filled 2018-12-03: qty 1

## 2018-12-03 MED ORDER — KETOROLAC TROMETHAMINE 60 MG/2ML IM SOLN
60.0000 mg | Freq: Once | INTRAMUSCULAR | Status: AC
  Administered 2018-12-03: 12:00:00 60 mg via INTRAMUSCULAR
  Filled 2018-12-03: qty 2

## 2018-12-03 MED ORDER — METOPROLOL SUCCINATE ER 50 MG PO TB24: ORAL_TABLET | ORAL | 0 refills | Status: DC

## 2018-12-03 MED ORDER — LISINOPRIL 10 MG PO TABS
10.0000 mg | ORAL_TABLET | Freq: Once | ORAL | Status: AC
  Administered 2018-12-03: 12:00:00 10 mg via ORAL
  Filled 2018-12-03: qty 1

## 2018-12-03 MED ORDER — LISINOPRIL-HYDROCHLOROTHIAZIDE 20-12.5 MG PO TABS: 1.0000 | ORAL_TABLET | Freq: Every day | ORAL | 0 refills | Status: DC

## 2018-12-03 MED ORDER — AMOXICILLIN 500 MG PO CAPS: 500.0000 mg | ORAL_CAPSULE | Freq: Three times a day (TID) | ORAL | 0 refills | Status: DC

## 2018-12-03 NOTE — ED Provider Notes (Signed)
[facility] EMERGENCY DEPARTMENT Provider Note   CSN: [id number] Arrival date & time: 12/03/18  1048    History   Chief Complaint Chief Complaint  Patient presents with  . Dental Pain    HPI Jennifer [name] is a 58 y.o. female.     Patient is a 58 year old female with past medical history of bipolar disorder, substance abuse, homelessness who presents emergency department for dental pain and medication refill.  Patient reports that she has had ongoing problems with her teeth for several years.  Reports that she does not have the money or resources to get to the dentist.  She comes to the emergency department quite often for medication refills and dental pain.  Patient reports that she is staying in a shed outside.  Patient requests antibiotic for her dental pain as well as refills on her blood pressure medication.  Patient reports her bottom left tooth has been painful for several weeks and she has a swollen lymph node.  Reports pain with swallowing but no fever, chills, nausea, vomiting or trouble breathing or eating.     Past Medical History:  Diagnosis Date  . bipolar   . Bipolar 1 disorder ([place])   . Depression   . Fibroids    now s/p hysterectomy  . Hypertension   . Left ventricular hypertrophy   . Polysubstance abuse ([place])    IV drug [place], cocaine on occassion, smoking and alcoholism, has been going to AA, not completely sober    Patient Active Problem List   Diagnosis Date Noted  . Current smoker 08/12/2016  . Homeless single person 08/12/2016  . Skin lesion of left leg 03/29/2015  . Trigger middle finger of right hand 01/11/2015  . Discoloration of eye 01/11/2015  . Bradycardia 01/11/2015  . Skin lesion of scalp 07/17/2014  . Leg swelling 05/20/2013  . Vasomotor rhinitis 08/17/2012  . Right leg pain 07/26/2012  . Anxiety 02/26/2012  . GERD (gastroesophageal reflux disease) 08/08/2011  . Menopausal symptoms 07/23/2011  . Mastalgia  07/23/2011  . Bipolar disorder, current episode manic without psychotic features, moderate ([place]) 12/05/2010  . 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 SEBACEOUS CYST, SCALP 05/06/2010  . SYNOVIAL CYST 05/03/2010  . Abdominal pain 12/06/2009  . DRUG ABUSE, HX OF 12/06/2009  . Lipoma of shoulder 02/06/2009  . FIBROIDS, UTERUS 02/06/2009  . DRUG ABUSE 02/06/2009  . Essential hypertension, benign 02/06/2009  . PAIN IN JOINT, SHOULDER REGION 02/06/2009  . Insomnia 02/06/2009  . PELVIC MASS 02/06/2009    Past Surgical History:  Procedure Laterality Date  . ABDOMINAL HYSTERECTOMY     2003  . BILATERAL SALPINGOOPHORECTOMY     01/2010  . DILATION AND CURETTAGE OF UTERUS    . EXPLORATORY LAPAROTOMY     complex pelvic mass 2011  . TONSILLECTOMY       OB History    Gravida  5   Para  0   Term  0   Preterm  0   AB  5   Living  0     SAB  2   TAB  1   Ectopic  0   Multiple  0   Live Births               Home Medications    Prior to Admission medications   Medication Sig Start Date End Date Taking? Authorizing Provider  amLODipine (NORVASC) 5 MG tablet Take 1 tablet (5 mg total) by mouth daily for 30 days.  12/03/18 01/02/19  Madilyn Hook A, PA-C  amoxicillin (AMOXIL) 500 MG capsule Take 1 capsule (500 mg total) by mouth 3 (three) times daily. 12/03/18   Alveria Apley, PA-C  dicyclomine (BENTYL) 20 MG tablet Take 1 tablet (20 mg total) by mouth 2 (two) times daily. Patient not taking: Reported on 09/28/2018 02/15/18   Jola Schmidt, MD  diphenoxylate-atropine (LOMOTIL) 2.5-0.025 MG tablet Take 1 tablet by mouth 4 (four) times daily as needed for diarrhea or loose stools. Patient not taking: Reported on 09/28/2018 02/03/18   Argentina Donovan, PA-C  lidocaine (XYLOCAINE) 2 % solution Use as directed 15 mLs in the mouth or throat as needed for mouth pain. Patient not taking: Reported on 09/28/2018 04/22/18   Delia Heady, PA-C  lisinopril-hydrochlorothiazide (ZESTORETIC) 20-12.5  MG tablet Take 1 tablet by mouth daily for 30 days. 12/03/18 01/02/19  Alveria Apley, PA-C  methocarbamol (ROBAXIN) 500 MG tablet Take 1 tablet (500 mg total) by mouth every 8 (eight) hours as needed for muscle spasms. Patient not taking: Reported on 09/28/2018 01/27/18   Charlott Rakes, MD  metoCLOPramide (REGLAN) 10 MG tablet Take 1 tablet (10 mg total) by mouth every 8 (eight) hours as needed for nausea. Patient not taking: Reported on 09/28/2018 02/15/18   Jola Schmidt, MD  metoprolol succinate (TOPROL-XL) 50 MG 24 hr tablet TAKE 1 TABLET BY MOUTH DAILY. TAKE WITH OR IMMEDIATELY FOLLOWING A MEAL. 12/03/18   Alveria Apley, PA-C  naproxen (NAPROSYN) 375 MG tablet Take 1 tablet (375 mg total) by mouth 2 (two) times daily. Patient not taking: Reported on 10/07/2018 10/05/18   Ashley Murrain, NP  ondansetron (ZOFRAN ODT) 8 MG disintegrating tablet Take 1 tablet (8 mg total) by mouth every 8 (eight) hours as needed for nausea or vomiting. Patient not taking: Reported on 09/28/2018 02/15/18   Jola Schmidt, MD  PARoxetine (PAXIL) 40 MG tablet Take 1 tablet (40 mg total) by mouth daily. MUST MAKE APPT FOR FURTHER REFILLS Patient not taking: Reported on 09/28/2018 07/27/18   Ladell Pier, MD    Family History Family History  Problem Relation Age of Onset  . Hypertension Mother   . Hypertension Sister   . Diabetes Father     Social History Social History   Tobacco Use  . Smoking status: Current Every Day Smoker    Packs/day: 0.50    Years: 20.00    Pack years: 10.00    Types: Cigarettes  . Smokeless tobacco: Never Used  . Tobacco comment: Wants to quit.   Substance Use Topics  . Alcohol use: Yes    Alcohol/week: 0.0 standard drinks  . Drug use: Not Currently    Types: Cocaine, Marijuana     Allergies   Patient has no known allergies.   Review of Systems Review of Systems  Constitutional: Negative for chills and fever.  HENT: Positive for dental problem. Negative for ear pain  and sore throat.   Eyes: Negative for pain and visual disturbance.  Respiratory: Negative for cough and shortness of breath.   Cardiovascular: Negative for chest pain and palpitations.  Gastrointestinal: Negative for abdominal pain and vomiting.  Genitourinary: Negative for dysuria and hematuria.  Musculoskeletal: Negative for arthralgias and back pain.  Skin: Negative for color change and rash.  Neurological: Negative for seizures and syncope.  All other systems reviewed and are negative.    Physical Exam Updated Vital Signs BP (!) 162/107 (BP Location: Right Arm)   Pulse 73   Temp  98.3 F (36.8 C) (Oral)   Resp 18   LMP 12/04/2009   SpO2 97%   Physical Exam Vitals signs and nursing note reviewed.  Constitutional:      General: She is not in acute distress.    Appearance: She is well-developed.  HENT:     Head: Normocephalic and atraumatic.     Mouth/Throat:     Mouth: Mucous membranes are moist.     Pharynx: Oropharynx is clear. Uvula midline. No pharyngeal swelling, oropharyngeal exudate, posterior oropharyngeal erythema or uvula swelling.     Tonsils: No tonsillar exudate or tonsillar abscesses.   Eyes:     Conjunctiva/sclera: Conjunctivae normal.  Neck:     Musculoskeletal: Neck supple.  Cardiovascular:     Rate and Rhythm: Normal rate and regular rhythm.     Heart sounds: No murmur.  Pulmonary:     Effort: Pulmonary effort is normal. No respiratory distress.     Breath sounds: Normal breath sounds.  Abdominal:     Palpations: Abdomen is soft.     Tenderness: There is no abdominal tenderness.  Skin:    General: Skin is warm and dry.  Neurological:     Mental Status: She is alert.      ED Treatments / Results  Labs (all labs ordered are listed, but only abnormal results are displayed) Labs Reviewed - No data to display  EKG None  Radiology No results found.  Procedures Procedures (including critical care time)  Medications Ordered in ED  Medications  amoxicillin-clavulanate (AUGMENTIN) 875-125 MG per tablet 1 tablet (has no administration in time range)  lisinopril (ZESTRIL) tablet 10 mg (has no administration in time range)  ketorolac (TORADOL) injection 60 mg (has no administration in time range)     Initial Impression / Assessment and Plan / ED Course  I have reviewed the triage vital signs and the nursing notes.  Pertinent labs & imaging results that were available during my care of the patient were reviewed by me and considered in my medical decision making (see chart for details).  Clinical Course as of Dec 03 1103  Fri Dec 03, 2018  1059 Patient frequently visits our emergency department for refill on her blood pressure medication as well as amoxicillin for her dental pain.  I discussed with the patient having case management come and see her in the emergency department to help her with arrangements due to her homelessness and lack of primary care doctor and ability to get her medications outpatient.  Patient declined my offer multiple times.  I told the patient that we are unable to refill her blood pressure medications on a long-term basis and that she would need an outpatient follow-up her blood pressure.  She reports that she understands but she still does not want to talk with anyone further about helping her with transportation, housing, primary care etc.   [KM]    Clinical Course User Index [KM] Alveria Apley, PA-C       Based on review of vitals, medical screening exam, lab work and/or imaging, there does not appear to be an acute, emergent etiology for the patient's symptoms. Counseled pt on good return precautions and encouraged both PCP and ED follow-up as needed.  Prior to discharge, I also discussed incidental imaging findings with patient in detail and advised appropriate, recommended follow-up in detail.  Clinical Impression: 1. Chronic dental pain   2. Dental caries   3. Homelessness   4.  Medication refill  5. Essential hypertension     Disposition: Discharge  Prior to providing a prescription for a controlled substance, I independently reviewed the patient's recent prescription history on the [facility]. The patient had no recent or regular prescriptions and was deemed appropriate for a brief, less than 3 day prescription of narcotic for acute analgesia.  This note was prepared with assistance of Systems analyst. Occasional wrong-word or sound-a-like substitutions may have occurred due to the inherent limitations of voice recognition software.   Final Clinical Impressions(s) / ED Diagnoses   Final diagnoses:  Chronic dental pain  Dental caries  Homelessness  Medication refill    ED Discharge Orders         Ordered    amLODipine (NORVASC) 5 MG tablet  Daily     12/03/18 1102    lisinopril-hydrochlorothiazide (ZESTORETIC) 20-12.5 MG tablet  Daily    Note to Pharmacy:  Needs office visit for additional refills   12/03/18 1102    metoprolol succinate (TOPROL-XL) 50 MG 24 hr tablet     12/03/18 1102    amoxicillin (AMOXIL) 500 MG capsule  3 times daily     12/03/18 [address] 12/03/18 1105    Davonna Belling, MD 12/03/18 1553

## 2018-12-03 NOTE — ED Triage Notes (Signed)
Patient reports Left sided dental pain for "years" she states she cannot afford to get to the dentist.

## 2018-12-15 MED FILL — AMLODIPINE BESYLATE 5 MG TA: 5 | 30 days supply | Qty: 30 | Fill #0

## 2018-12-15 MED FILL — LISINOPRIL-HCTZ 20-12.5 MG: 20-12.5 | 30 days supply | Qty: 30 | Fill #0

## 2018-12-23 ENCOUNTER — Other Ambulatory Visit: Payer: Self-pay

## 2018-12-23 ENCOUNTER — Ambulatory Visit: Payer: Self-pay | Attending: Family Medicine | Admitting: Physician Assistant

## 2018-12-23 DIAGNOSIS — G4700 Insomnia, unspecified: Secondary | ICD-10-CM

## 2018-12-23 DIAGNOSIS — G5603 Carpal tunnel syndrome, bilateral upper limbs: Secondary | ICD-10-CM

## 2018-12-23 DIAGNOSIS — R059 Cough, unspecified: Secondary | ICD-10-CM

## 2018-12-23 DIAGNOSIS — R05 Cough: Secondary | ICD-10-CM

## 2018-12-23 MED ORDER — NAPROXEN 500 MG PO TABS: 500.0000 mg | ORAL_TABLET | Freq: Two times a day (BID) | ORAL | 0 refills | Status: DC

## 2018-12-23 MED ORDER — TRAZODONE HCL 50 MG PO TABS: 25.0000 mg | ORAL_TABLET | Freq: Every evening | ORAL | 3 refills | Status: DC | PRN

## 2018-12-23 MED ORDER — BENZONATATE 100 MG PO CAPS: 200.0000 mg | ORAL_CAPSULE | Freq: Three times a day (TID) | ORAL | 0 refills | Status: DC | PRN

## 2018-12-23 NOTE — Progress Notes (Signed)
[name], female   DOB: 14-Jul-1961, 58 y.o.   MRN: [id number] Virtual Visit via Telephone Note  I connected with Jennifer [name] on 12/23/18 at  9:10 AM EDT by telephone and verified that I am speaking with the correct person using two identifiers.   I discussed the limitations, risks, security and privacy concerns of performing an evaluation and management service by telephone and the availability of in person appointments. I also discussed with the patient that there may be a patient responsible charge related to this service. The patient expressed understanding and agreed to proceed.  Patient location:  home My Location:  [place] office Persons on the call:  Myself and the patient.   History of Present Illness:    Patient c/o B hand pain and numbness esp worse at night.  Hasn't tried anything for relief.  Also c/o poor sleep which has been an ongoing issue.  Also has dry cough at times.  No mucus.  Doesn't feel sick.  No fever.    Observations/Objective:  Speech is pressured at times.  A&Ox3   Assessment and Plan: 1. Insomnia, unspecified type Sleep hygiene reviewed - traZODone (DESYREL) 50 MG tablet; Take 0.5-1 tablets (25-50 mg total) by mouth at bedtime as needed for sleep.  Dispense: 30 tablet; Refill: 3  2. Cough - benzonatate (TESSALON) 100 MG capsule; Take 2 capsules (200 mg total) by mouth 3 (three) times daily as needed.  Dispense: 40 capsule; Refill: 0  3. Bilateral carpal tunnel syndrome -obtain wrist splints to wear at night.  Needs to reapply for orange card - Ambulatory referral to Hand Surgery - naproxen (NAPROSYN) 500 MG tablet; Take 1 tablet (500 mg total) by mouth 2 (two) times daily with a meal.  Dispense: 60 tablet; Refill: 0    Follow Up Instructions:  F/up PCP in 6-8 weeks;  Sooner if needed    I discussed the assessment and treatment plan with the patient. The patient was provided an opportunity to ask questions and all were answered. The  patient agreed with the plan and demonstrated an understanding of the instructions.   The patient was advised to call back or seek an in-person evaluation if the symptoms worsen or if the condition fails to improve as anticipated.  I provided 15 minutes of non-face-to-face time during this encounter.   Freeman Caldron, PA-C

## 2018-12-23 NOTE — Progress Notes (Signed)
Called patient to initiate their telephone visit with provider Freeman Caldron, PA-C. Verified date of birth. Patient has c/o B hand numbess x 6 months. Has a lipoma on her R shoulder that she feels is causing the numbness . KWalker, CMA.

## 2019-01-10 MED FILL — METOPROLOL SUCCINATE ER 50: 50 | 30 days supply | Qty: 30 | Fill #0

## 2019-01-10 MED FILL — traZODone HCL 50 MG TABS: 50 | 30 days supply | Qty: 30 | Fill #0

## 2019-01-10 MED FILL — BENZONATATE 100 MG CAPS: 100 | 13 days supply | Qty: 40 | Fill #0

## 2019-01-10 MED FILL — AMLODIPINE BESYLATE 5 MG TA: 5 | 30 days supply | Qty: 30 | Fill #0

## 2019-01-13 ENCOUNTER — Other Ambulatory Visit: Payer: Self-pay | Admitting: Pharmacist

## 2019-01-13 DIAGNOSIS — I10 Essential (primary) hypertension: Secondary | ICD-10-CM

## 2019-01-13 MED ORDER — LISINOPRIL-HYDROCHLOROTHIAZIDE 20-12.5 MG PO TABS: 1.0000 | ORAL_TABLET | Freq: Every day | ORAL | 2 refills | Status: DC

## 2019-01-14 MED FILL — LISINOPRIL-HCTZ 20-12.5 MG: 20-12.5 | 30 days supply | Qty: 30 | Fill #0

## 2019-01-17 ENCOUNTER — Encounter (HOSPITAL_COMMUNITY): Payer: Self-pay | Admitting: Emergency Medicine

## 2019-01-17 ENCOUNTER — Other Ambulatory Visit: Payer: Self-pay

## 2019-01-17 ENCOUNTER — Emergency Department (HOSPITAL_COMMUNITY)
Admission: EM | Admit: 2019-01-17 | Discharge: 2019-01-17 | Disposition: A | Discharge status: Home / Self Care | Payer: Self-pay | Attending: Emergency Medicine | Admitting: Emergency Medicine

## 2019-01-17 DIAGNOSIS — Z5321 Procedure and treatment not carried out due to patient leaving prior to being seen by health care provider: Secondary | ICD-10-CM | POA: Insufficient documentation

## 2019-01-17 DIAGNOSIS — I10 Essential (primary) hypertension: Secondary | ICD-10-CM | POA: Insufficient documentation

## 2019-01-17 NOTE — ED Triage Notes (Signed)
Pt has HTN and out of her medications bc cant afford to get filled due to being out of work. hasnt had them in week.

## 2019-01-25 ENCOUNTER — Encounter: Payer: Self-pay | Admitting: Family Medicine

## 2019-01-25 ENCOUNTER — Other Ambulatory Visit: Payer: Self-pay

## 2019-01-25 ENCOUNTER — Ambulatory Visit: Payer: Self-pay | Attending: Family Medicine | Admitting: Family Medicine

## 2019-01-25 DIAGNOSIS — G5601 Carpal tunnel syndrome, right upper limb: Secondary | ICD-10-CM

## 2019-01-25 DIAGNOSIS — I10 Essential (primary) hypertension: Secondary | ICD-10-CM

## 2019-01-25 DIAGNOSIS — K2950 Unspecified chronic gastritis without bleeding: Secondary | ICD-10-CM

## 2019-01-25 MED ORDER — METOPROLOL SUCCINATE ER 50 MG PO TB24: 50.0000 mg | ORAL_TABLET | Freq: Every day | ORAL | 3 refills | Status: DC

## 2019-01-25 MED ORDER — OMEPRAZOLE 40 MG PO CPDR: 40.0000 mg | DELAYED_RELEASE_CAPSULE | Freq: Every day | ORAL | 3 refills | Status: DC

## 2019-01-25 MED ORDER — NAPROXEN 500 MG PO TABS: 500.0000 mg | ORAL_TABLET | Freq: Two times a day (BID) | ORAL | 1 refills | Status: DC

## 2019-01-25 NOTE — Progress Notes (Signed)
Patient has been called and DOB has been verified. Patient has been screened and transferred to PCP to start phone visit.  Patient states that her hands are numb when she wakes in the morning.  Patient states that she vomits after she eats.

## 2019-01-25 NOTE — Progress Notes (Signed)
Virtual Visit via Telephone Note  I connected with Jennifer [name], on 01/25/2019 at 10:48 AM by telephone due to the COVID-19 pandemic and verified that I am speaking with the correct person using two identifiers.   Consent: I discussed the limitations, risks, security and privacy concerns of performing an evaluation and management service by telephone and the availability of in person appointments. I also discussed with the patient that there may be a patient responsible charge related to this service. The patient expressed understanding and agreed to proceed.   Location of Patient: Home  Location of Provider: Clinic   Persons participating in Telemedicine visit: Cecily E Kimmarie Pascale Farrington-CMA Dr. Felecia Shelling     History of Present Illness: 58 year old female with a history of hypertension who is seen today for follow-up visit. She complains of early morning nausea and vomiting with associated reflux and sometimes this wakes her up at 3 AM or 4 AM.  Symptoms had initially occurred 6 months ago then resolved and she was informed she would need an upper endoscopy however due to lack of medical coverage this never happened.  Symptoms have since returned and she denies abdominal pain. Symptoms are unrelated to meals.  Denies hematochezia, hematemesis, constipation or diarrhea.  She complains of numbness in her right arm to the point where it takes about 5 minutes before she can have use of her arm.  Symptoms have been present for the last 8 to 9 months and occur about once to twice a month and she has to wake up to shake her hand at night for relief of symptoms.  She cleans houses for living. Symptoms are sometimes present in her left arm but this occurs sparingly. She denies neck pain. She has been compliant with her antihypertensives and has no side effects from her medications.     Past Medical History:  Diagnosis Date  . bipolar   . Bipolar 1 disorder ([place])   .  Depression   . Fibroids    now s/p hysterectomy  . Hypertension   . Left ventricular hypertrophy   . Polysubstance abuse ([place])    IV drug [place], cocaine on occassion, smoking and alcoholism, has been going to AA, not completely sober   No Known Allergies  Current Outpatient Medications on File Prior to Visit  Medication Sig Dispense Refill  . benzonatate (TESSALON) 100 MG capsule Take 2 capsules (200 mg total) by mouth 3 (three) times daily as needed. 40 capsule 0  . lisinopril-hydrochlorothiazide (ZESTORETIC) 20-12.5 MG tablet Take 1 tablet by mouth daily. 30 tablet 2  . metoprolol succinate (TOPROL-XL) 50 MG 24 hr tablet TAKE 1 TABLET BY MOUTH DAILY. TAKE WITH OR IMMEDIATELY FOLLOWING A MEAL. 5 tablet 0  . naproxen (NAPROSYN) 500 MG tablet Take 1 tablet (500 mg total) by mouth 2 (two) times daily with a meal. 60 tablet 0  . traZODone (DESYREL) 50 MG tablet Take 0.5-1 tablets (25-50 mg total) by mouth at bedtime as needed for sleep. 30 tablet 3  . amLODipine (NORVASC) 5 MG tablet Take 1 tablet (5 mg total) by mouth daily for 30 days. 30 tablet 0   No current facility-administered medications on file prior to visit.     Observations/Objective: Awake, alert, oriented x3 Not in acute distress  Assessment and Plan: 1.  Carpal tunnel syndrome of right wrist Advised to obtain wrist brace If symptoms are uncontrolled on NSAID, will need cortisone injection - naproxen (NAPROSYN) 500 MG tablet; Take 1 tablet (500 mg  total) by mouth 2 (two) times daily with a meal.  Dispense: 60 tablet; Refill: 1  2. Other chronic gastritis without hemorrhage - H. pylori breath test; Future - omeprazole (PRILOSEC) 40 MG capsule; Take 1 capsule (40 mg total) by mouth daily.  Dispense: 30 capsule; Refill: 3  3. Essential hypertension Stable Counseled on blood pressure goal of less than 130/80, low-sodium, DASH diet, medication compliance, 150 minutes of moderate intensity exercise per week. Discussed  medication compliance, adverse effects. - CMP14+EGFR; Future - Lipid panel; Future - metoprolol succinate (TOPROL-XL) 50 MG 24 hr tablet; Take 1 tablet (50 mg total) by mouth daily.  Dispense: 30 tablet; Refill: 3   Follow Up Instructions: Return in about 1 month (around 02/24/2019) for Hypertension.    I discussed the assessment and treatment plan with the patient. The patient was provided an opportunity to ask questions and all were answered. The patient agreed with the plan and demonstrated an understanding of the instructions.   The patient was advised to call back or seek an in-person evaluation if the symptoms worsen or if the condition fails to improve as anticipated.     I provided 15 minutes total of non-face-to-face time during this encounter including median intraservice time, reviewing previous notes, labs, imaging, medications, management and patient verbalized understanding.     Charlott Rakes, MD, FAAFP. [facility] and [place], [place]   01/25/2019, 10:48 AM

## 2019-02-15 ENCOUNTER — Ambulatory Visit: Payer: Self-pay | Admitting: Internal Medicine

## 2019-02-21 ENCOUNTER — Ambulatory Visit: Payer: Self-pay

## 2019-02-28 ENCOUNTER — Emergency Department (HOSPITAL_COMMUNITY): Admission: EM | Admit: 2019-02-28 | Discharge: 2019-02-28 | Discharge status: Against Medical Advice | Payer: Self-pay

## 2019-02-28 MED FILL — LISINOPRIL-HCTZ 20-12.5 MG: 20-12.5 | 30 days supply | Qty: 30 | Fill #1

## 2019-02-28 MED FILL — ?METOPROLOL SUCC ER 50MG TA: 50 | 30 days supply | Qty: 30 | Fill #0

## 2019-02-28 MED FILL — ?AMLODIPINE BESYLATE 5MG TA: 5 | 30 days supply | Qty: 30 | Fill #0

## 2019-02-28 MED FILL — ?OMEPRAZOLE 20 MG CAPSULE D: 20 | 30 days supply | Qty: 60 | Fill #0

## 2019-03-22 ENCOUNTER — Ambulatory Visit: Payer: Self-pay | Admitting: Family Medicine

## 2019-03-25 MED FILL — LISINOPRIL-HCTZ 20-12.5 MG: 20-12.5 | 30 days supply | Qty: 30 | Fill #2

## 2019-03-25 MED FILL — ?METOPROLOL SUCC ER 50MG TA: 50 | 30 days supply | Qty: 30 | Fill #1

## 2019-03-28 ENCOUNTER — Encounter: Payer: Self-pay | Admitting: Nurse Practitioner

## 2019-03-28 ENCOUNTER — Other Ambulatory Visit: Payer: Self-pay | Admitting: Nurse Practitioner

## 2019-03-28 ENCOUNTER — Ambulatory Visit: Payer: Self-pay | Admitting: Pharmacist

## 2019-03-28 ENCOUNTER — Encounter (HOSPITAL_COMMUNITY): Payer: Self-pay | Admitting: Emergency Medicine

## 2019-03-28 ENCOUNTER — Other Ambulatory Visit: Payer: Self-pay

## 2019-03-28 ENCOUNTER — Emergency Department (HOSPITAL_COMMUNITY)
Admission: EM | Admit: 2019-03-28 | Discharge: 2019-03-28 | Disposition: A | Discharge status: Home / Self Care | Payer: Self-pay | Attending: Emergency Medicine | Admitting: Emergency Medicine

## 2019-03-28 ENCOUNTER — Ambulatory Visit: Payer: Self-pay | Attending: Family Medicine | Admitting: Nurse Practitioner

## 2019-03-28 DIAGNOSIS — I10 Essential (primary) hypertension: Secondary | ICD-10-CM | POA: Insufficient documentation

## 2019-03-28 DIAGNOSIS — Z79899 Other long term (current) drug therapy: Secondary | ICD-10-CM | POA: Insufficient documentation

## 2019-03-28 DIAGNOSIS — F17210 Nicotine dependence, cigarettes, uncomplicated: Secondary | ICD-10-CM | POA: Insufficient documentation

## 2019-03-28 LAB — BASIC METABOLIC PANEL
Anion gap: 9 (ref 5–15)
BUN: 19 mg/dL (ref 6–20)
CO2: 24 mmol/L (ref 22–32)
Calcium: 9.5 mg/dL (ref 8.9–10.3)
Chloride: 106 mmol/L (ref 98–111)
Creatinine, Ser: 0.86 mg/dL (ref 0.44–1.00)
GFR calc Af Amer: >60 mL/min (ref >60)
GFR calc non Af Amer: >60 mL/min (ref >60)
Glucose, Bld: 91 mg/dL (ref 70–99)
Potassium: 4.4 mmol/L (ref 3.5–5.1)
Sodium: 139 mmol/L (ref 135–145)

## 2019-03-28 LAB — URINALYSIS, ROUTINE W REFLEX MICROSCOPIC
Bilirubin Urine: NEGATIVE
Glucose, UA: NEGATIVE mg/dL
Ketones, ur: NEGATIVE mg/dL
Leukocytes,Ua: NEGATIVE
Nitrite: NEGATIVE
Protein, ur: NEGATIVE mg/dL
Specific Gravity, Urine: 1.021 (ref 1.005–1.030)
pH: 5 (ref 5.0–8.0)

## 2019-03-28 LAB — CBC WITH DIFFERENTIAL/PLATELET
Abs Immature Granulocytes: 0 10*3/uL (ref 0.00–0.07)
Basophils Absolute: 0 10*3/uL (ref 0.0–0.1)
Basophils Relative: 1 %
Eosinophils Absolute: 0.1 10*3/uL (ref 0.0–0.5)
Eosinophils Relative: 1 %
HCT: 44.3 % (ref 36.0–46.0)
Hemoglobin: 14.2 g/dL (ref 12.0–15.0)
Immature Granulocytes: 0 %
Lymphocytes Relative: 39 %
Lymphs Abs: 1.9 10*3/uL (ref 0.7–4.0)
MCH: 29.2 pg (ref 26.0–34.0)
MCHC: 32.1 g/dL (ref 30.0–36.0)
MCV: 91 fL (ref 80.0–100.0)
Monocytes Absolute: 0.4 10*3/uL (ref 0.1–1.0)
Monocytes Relative: 7 %
Neutro Abs: 2.5 10*3/uL (ref 1.7–7.7)
Neutrophils Relative %: 52 %
Platelets: 207 10*3/uL (ref 150–400)
RBC: 4.87 MIL/uL (ref 3.87–5.11)
RDW: 14.2 % (ref 11.5–15.5)
WBC: 4.9 10*3/uL (ref 4.0–10.5)
nRBC: 0 % (ref 0.0–0.2)

## 2019-03-28 MED ORDER — AMLODIPINE BESYLATE 5 MG PO TABS: 5.0000 mg | ORAL_TABLET | Freq: Every day | ORAL | 0 refills | Status: DC

## 2019-03-28 MED ORDER — AMLODIPINE BESYLATE 5 MG PO TABS
5.0000 mg | ORAL_TABLET | Freq: Once | ORAL | Status: AC
  Administered 2019-03-28: 13:00:00 5 mg via ORAL
  Filled 2019-03-28: qty 1

## 2019-03-28 MED FILL — ?AMLODIPINE BESYLATE 5MG TA: 5 | 30 days supply | Qty: 30 | Fill #0

## 2019-03-28 NOTE — ED Notes (Signed)
Pt presents to ED for evaluation of HTN and nausea every morning between 0300-0400 x6 months.  Upon entering room pt is yelling at the PA stating, "I'm not taing another pill, I don't know why you think one pill will fix this when I take 3 a day and its not fixed."  After reading through previous visits pt has been seen at her PCP for the same symptoms in June, symptoms are ongoing 6+ months. Pt is noncompliant with home meds. Does not refill Rx as she should. Pt also reports she is not taking her Trazodone, states she took it one time and it made her feel funny so she "threw it away."

## 2019-03-28 NOTE — ED Triage Notes (Signed)
Pt pulling leads off and wanting to go home the patient requesting to speak to CN.

## 2019-03-28 NOTE — ED Triage Notes (Signed)
Pt arrives today , she states she wanted to see her Dr because despite taking her high blood pressure pills it is still high. She states that she had two if the medicines refilled but not the third. Pt states the Dr was going to see her but they called her on the phone and she wants to see a Dr in person. Pt states that at 3:00 every night she vomits. She states she was given medicine for this but it doesn't work and she is not taking it.

## 2019-03-28 NOTE — ED Notes (Signed)
Pt left with out DC papers to go to [place] .

## 2019-03-28 NOTE — Progress Notes (Signed)
TOC CM referral - Medications  Noted pt had 5 ED visits in past six months. She has PCP at the [facility] and she can receive her meds from [facility] for a discounted price. Appt scheduled for [facility] 04/12/2019.Jonnie Finner RN CCM Case Mgmt phone [phone]

## 2019-03-28 NOTE — Discharge Instructions (Addendum)
Take all of your medicines as prescribed by your doctor. Follow up with your doctor for further management.

## 2019-03-28 NOTE — ED Notes (Signed)
Patient denies pain and is resting comfortably.  

## 2019-03-28 NOTE — ED Triage Notes (Signed)
CN called to confirm the BP meds were called to Pharmacy at [facility]. This info was relayed to Pt. And Pt walked out of ED to go get her BP meds.

## 2019-03-28 NOTE — Progress Notes (Signed)
Patient is also in the emergency room at this time. I instructed her that we would not do a tele visit while she is in the emergency room. She states she would like a refill of her amlodipine and her omeprazole is not working for her stomach pain which she is currently being seen in the emergency room for. I will refill her amlodipine and will forward note to her PCP for further recommendations in the regard to the omeprazole and current ER visit.

## 2019-03-28 NOTE — ED Provider Notes (Signed)
[facility] EMERGENCY DEPARTMENT Provider Note   CSN: [id number] Arrival date & time: 03/28/19  8185    History   Chief Complaint Chief Complaint  Patient presents with   Hypertension    HPI Jennifer [name] is a 58 y.o. female.     58yo female with HTN, missing Amlodipine meds x 3 days, monitoring BP at home. Feels nauseous currently, no chest pain, SHOB, visual changes. Patient had a virtual visit today but did not talk to her doctor as she wanted to see her doctor in person, sees Dr. Worthy Rancher. Reports vomiting at 3PM for 2-3 years, awaiting referral with GI. Patient has been taking Priosec x 2 weeks, continues to have vomiting. No other complaints or concerns.      Past Medical History:  Diagnosis Date   bipolar    Bipolar 1 disorder ([place])    Depression    Fibroids    now s/p hysterectomy   Hypertension    Left ventricular hypertrophy    Polysubstance abuse ([place])    IV drug [place], cocaine on occassion, smoking and alcoholism, has been going to AA, not completely sober    Patient Active Problem List   Diagnosis Date Noted   Current smoker 08/12/2016   Homeless single person 08/12/2016   Skin lesion of left leg 03/29/2015   Trigger middle finger of right hand 01/11/2015   Discoloration of eye 01/11/2015   Bradycardia 01/11/2015   Skin lesion of scalp 07/17/2014   Leg swelling 05/20/2013   Vasomotor rhinitis 08/17/2012   Right leg pain 07/26/2012   Anxiety 02/26/2012   GERD (gastroesophageal reflux disease) 08/08/2011   Menopausal symptoms 07/23/2011   Mastalgia 07/23/2011   Bipolar disorder, current episode manic without psychotic features, moderate ([place]) 12/05/2010   HORDEOLUM EXTERNUM 08/27/2010   SEBACEOUS CYST, SCALP 05/06/2010   SYNOVIAL CYST 05/03/2010   Abdominal pain 12/06/2009   DRUG ABUSE, HX OF 12/06/2009   Lipoma of shoulder 02/06/2009   FIBROIDS, UTERUS 02/06/2009   DRUG ABUSE 02/06/2009    Essential hypertension, benign 02/06/2009   PAIN IN JOINT, SHOULDER REGION 02/06/2009   Insomnia 02/06/2009   PELVIC MASS 02/06/2009    Past Surgical History:  Procedure Laterality Date   ABDOMINAL HYSTERECTOMY     2003   BILATERAL SALPINGOOPHORECTOMY     01/2010   DILATION AND CURETTAGE OF UTERUS     EXPLORATORY LAPAROTOMY     complex pelvic mass 2011   TONSILLECTOMY       OB History    Gravida  5   Para  0   Term  0   Preterm  0   AB  5   Living  0     SAB  2   TAB  1   Ectopic  0   Multiple  0   Live Births               Home Medications    Prior to Admission medications   Medication Sig Start Date End Date Taking? Authorizing Provider  amLODipine (NORVASC) 5 MG tablet Take 1 tablet (5 mg total) by mouth daily. 03/28/19 04/27/19  Gildardo Pounds, NP  benzonatate (TESSALON) 100 MG capsule Take 2 capsules (200 mg total) by mouth 3 (three) times daily as needed. 12/23/18   Argentina Donovan, PA-C  lisinopril-hydrochlorothiazide (ZESTORETIC) 20-12.5 MG tablet Take 1 tablet by mouth daily. 01/13/19 04/13/19  Ladell Pier, MD  metoprolol succinate (TOPROL-XL) 50 MG 24 hr tablet  Take 1 tablet (50 mg total) by mouth daily. 01/25/19   Charlott Rakes, MD  naproxen (NAPROSYN) 500 MG tablet Take 1 tablet (500 mg total) by mouth 2 (two) times daily with a meal. 01/25/19   Charlott Rakes, MD  omeprazole (PRILOSEC) 40 MG capsule Take 1 capsule (40 mg total) by mouth daily. 01/25/19   Charlott Rakes, MD  traZODone (DESYREL) 50 MG tablet Take 0.5-1 tablets (25-50 mg total) by mouth at bedtime as needed for sleep. 12/23/18   Argentina Donovan, PA-C    Family History Family History  Problem Relation Age of Onset   Hypertension Mother    Hypertension Sister    Diabetes Father     Social History Social History   Tobacco Use   Smoking status: Current Every Day Smoker    Packs/day: 0.50    Years: 20.00    Pack years: 10.00    Types: Cigarettes    Smokeless tobacco: Never Used   Tobacco comment: Wants to quit.   Substance Use Topics   Alcohol use: Yes    Alcohol/week: 0.0 standard drinks   Drug use: Not Currently    Types: Cocaine, Marijuana     Allergies   Patient has no known allergies.   Review of Systems Review of Systems  Constitutional: Negative for fever.  Eyes: Negative for visual disturbance.  Respiratory: Negative for shortness of breath.   Cardiovascular: Negative for chest pain.  Gastrointestinal: Positive for nausea and vomiting. Negative for abdominal pain, constipation and diarrhea.  Genitourinary: Negative for dysuria, frequency and urgency.  Musculoskeletal: Negative for arthralgias and myalgias.  Skin: Negative for rash and wound.  Allergic/Immunologic: Negative for immunocompromised state.  Neurological: Negative for headaches.  All other systems reviewed and are negative.    Physical Exam Updated Vital Signs BP (!) 162/119    Pulse (!) 59    Temp 98.5 F (36.9 C) (Oral)    Resp 16    LMP 12/04/2009    SpO2 99%   Physical Exam Vitals signs and nursing note reviewed.  Constitutional:      General: She is not in acute distress.    Appearance: She is well-developed. She is not diaphoretic.  HENT:     Head: Normocephalic and atraumatic.  Cardiovascular:     Rate and Rhythm: Normal rate and regular rhythm.     Pulses: Normal pulses.     Heart sounds: Normal heart sounds.  Pulmonary:     Effort: Pulmonary effort is normal.     Breath sounds: Normal breath sounds.  Abdominal:     Comments: Refused abdominal exam  Musculoskeletal:     Right lower leg: No edema.     Left lower leg: No edema.  Skin:    General: Skin is warm and dry.     Findings: No erythema or rash.  Neurological:     Mental Status: She is alert and oriented to person, place, and time.  Psychiatric:        Behavior: Behavior normal.      ED Treatments / Results  Labs (all labs ordered are listed, but only abnormal  results are displayed) Labs Reviewed  URINALYSIS, ROUTINE W REFLEX MICROSCOPIC - Abnormal; Notable for the following components:      Result Value   Hgb urine dipstick SMALL (*)    Bacteria, UA RARE (*)    All other components within normal limits  BASIC METABOLIC PANEL  CBC WITH DIFFERENTIAL/PLATELET    EKG None  Radiology  No results found.  Procedures Procedures (including critical care time)  Medications Ordered in ED Medications  amLODipine (NORVASC) tablet 5 mg (5 mg Oral Given 03/28/19 1243)     Initial Impression / Assessment and Plan / ED Course  I have reviewed the triage vital signs and the nursing notes.  Pertinent labs & imaging results that were available during my care of the patient were reviewed by me and considered in my medical decision making (see chart for details).  Clinical Course as of Mar 27 1305  Mon Mar 27, 5872  860 58 year old female presents with complaint of elevated blood pressure readings.  Patient was scheduled to have a virtual visit with her PCP today however preferred to see her doctor in person as she states she has multiple things to talk to her doctor about and refused her virtual visit.  Patient presents to the ER with elevated blood pressure readings without symptoms of hypertensive emergency.  Patient had 2 of her medications refilled however did not have her amlodipine refilled, review of records shows this was sent to her pharmacy today.  Upon examining the patient she became very hostile, refusing abdominal exam and demanded to see my supervisor.  Patient is unhappy with her plan of care currently which includes chest x-ray, EKG, basic lab work as well as a dose of patient's amlodipine.  Patient is very upset that her blood pressure is high and feels like she needs this emergently lowered.  I attempted to explain to the patient the importance of calming down in regards to helping to lower her blood pressure.  Patient demanded to see my  supervisor at that time.  Dr. Melina Copa, ER attending, made aware of the situation.   [LM]  648 58 year old female here with elevated blood pressure in the setting of not having had access to her amlodipine for the last 3 days.  She is very irritated that her PCP could not see her as a visit today and is unhappy with her blood pressure being elevated here.  We gave her her dose of amlodipine but she is asking for further doses of medications because she thinks her blood pressure is too high.  She is awake and alert and although her blood pressure is a little elevated she is very angry and otherwise there is no evidence of any endorgan damage.  She is asking to be discharged and I think this is reasonable as we have not seen any serious findings so far.  She understands to restart her medications.   [MB]  6389 CBC, BMP unremarkable, UA with small hgb, no RBC. Patient was seen by Dr. Melina Copa, patient requests to leave at this time. Patient discharged with plan to follow up with PCP, may return to the ER for any new or worsening symptoms.    [LM]    Clinical Course User Index [LM] Tacy Learn, PA-C [MB] Hayden Rasmussen, MD      Final Clinical Impressions(s) / ED Diagnoses   Final diagnoses:  Hypertension, unspecified type    ED Discharge Orders    None       Roque Lias 03/28/19 1306    Hayden Rasmussen, MD 03/29/19 [phone]

## 2019-04-12 ENCOUNTER — Ambulatory Visit: Payer: Self-pay | Admitting: Family Medicine

## 2019-04-26 ENCOUNTER — Other Ambulatory Visit: Payer: Self-pay | Admitting: *Deleted

## 2019-04-26 DIAGNOSIS — I10 Essential (primary) hypertension: Secondary | ICD-10-CM

## 2019-04-26 MED ORDER — LISINOPRIL-HYDROCHLOROTHIAZIDE 20-12.5 MG PO TABS: 1.0000 | ORAL_TABLET | Freq: Every day | ORAL | 0 refills | Status: DC

## 2019-04-26 MED ORDER — AMLODIPINE BESYLATE 5 MG PO TABS: 5.0000 mg | ORAL_TABLET | Freq: Every day | ORAL | 0 refills | Status: DC

## 2019-04-26 MED FILL — ?METOPROLOL SUCC ER 50MG TA: 50 | 30 days supply | Qty: 30 | Fill #2

## 2019-04-26 MED FILL — LISINOPRIL-HCTZ 20-12.5 MG: 20-12.5 | 30 days supply | Qty: 30 | Fill #0

## 2019-04-26 MED FILL — ?AMLODIPINE BESYLATE 5MG TA: 5 | 30 days supply | Qty: 30 | Fill #0

## 2019-05-03 ENCOUNTER — Ambulatory Visit: Payer: Self-pay | Admitting: Family Medicine

## 2019-05-13 ENCOUNTER — Other Ambulatory Visit: Payer: Self-pay

## 2019-05-13 ENCOUNTER — Emergency Department (HOSPITAL_COMMUNITY)
Admission: EM | Admit: 2019-05-13 | Discharge: 2019-05-13 | Disposition: A | Discharge status: Home / Self Care | Payer: Self-pay

## 2019-05-13 NOTE — ED Triage Notes (Signed)
Patient states she wanted to come in and get her BP checked (152/107). As soon  As Probation officer took her BP the patient jumped out of the chair and states, "I'm just going to take my blood pressure medicine and go. patient reached for her medication and gatorade took the pill and left.

## 2019-05-24 ENCOUNTER — Other Ambulatory Visit: Payer: Self-pay | Admitting: Pharmacist

## 2019-05-24 DIAGNOSIS — I10 Essential (primary) hypertension: Secondary | ICD-10-CM

## 2019-05-24 MED ORDER — AMLODIPINE BESYLATE 5 MG PO TABS: 5.0000 mg | ORAL_TABLET | Freq: Every day | ORAL | 0 refills | Status: DC

## 2019-05-24 MED ORDER — LISINOPRIL-HYDROCHLOROTHIAZIDE 20-12.5 MG PO TABS: 1.0000 | ORAL_TABLET | Freq: Every day | ORAL | 0 refills | Status: DC

## 2019-05-24 MED FILL — LISINOPRIL-HCTZ 20-12.5 MG: 20-12.5 | 30 days supply | Qty: 30 | Fill #0

## 2019-05-24 MED FILL — ?AMLODIPINE BESYLATE 5MG TA: 5 | 30 days supply | Qty: 30 | Fill #0

## 2019-05-24 MED FILL — ?METOPROLOL SUCC ER 50MG TA: 50 | 30 days supply | Qty: 30 | Fill #3

## 2019-06-20 ENCOUNTER — Emergency Department (HOSPITAL_COMMUNITY): Admission: EM | Admit: 2019-06-20 | Discharge: 2019-06-20 | Discharge status: Absent without leave | Payer: Self-pay

## 2019-06-20 ENCOUNTER — Other Ambulatory Visit: Payer: Self-pay | Admitting: Pharmacist

## 2019-06-20 DIAGNOSIS — I10 Essential (primary) hypertension: Secondary | ICD-10-CM

## 2019-06-20 MED ORDER — AMLODIPINE BESYLATE 5 MG PO TABS: 5.0000 mg | ORAL_TABLET | Freq: Every day | ORAL | 0 refills | Status: DC

## 2019-06-20 MED ORDER — LISINOPRIL-HYDROCHLOROTHIAZIDE 20-12.5 MG PO TABS: 1.0000 | ORAL_TABLET | Freq: Every day | ORAL | 0 refills | Status: DC

## 2019-06-20 MED ORDER — METOPROLOL SUCCINATE ER 50 MG PO TB24: 50.0000 mg | ORAL_TABLET | Freq: Every day | ORAL | 0 refills | Status: DC

## 2019-06-20 MED FILL — AMLODIPINE BESYLATE 5 MG TA: 5 | 1 days supply | Qty: 1 | Fill #0

## 2019-06-20 MED FILL — LISINOPRIL-HCTZ 20-12.5 MG: 20-12.5 | 1 days supply | Qty: 1 | Fill #0

## 2019-06-20 MED FILL — METOPROLOL SUCCINATE ER 50: 50 | 1 days supply | Qty: 1 | Fill #0

## 2019-06-21 ENCOUNTER — Ambulatory Visit: Payer: Self-pay | Admitting: Family Medicine

## 2019-07-04 ENCOUNTER — Emergency Department (HOSPITAL_COMMUNITY): Admission: EM | Admit: 2019-07-04 | Discharge: 2019-07-04 | Discharge status: Absent without leave | Payer: Self-pay

## 2019-07-04 ENCOUNTER — Telehealth: Payer: Self-pay | Admitting: *Deleted

## 2019-07-04 DIAGNOSIS — I10 Essential (primary) hypertension: Secondary | ICD-10-CM

## 2019-07-04 MED ORDER — AMLODIPINE BESYLATE 5 MG PO TABS: 5.0000 mg | ORAL_TABLET | Freq: Every day | ORAL | 0 refills | Status: DC

## 2019-07-04 MED ORDER — LISINOPRIL-HYDROCHLOROTHIAZIDE 20-12.5 MG PO TABS: 1.0000 | ORAL_TABLET | Freq: Every day | ORAL | 0 refills | Status: DC

## 2019-07-04 MED ORDER — METOPROLOL SUCCINATE ER 50 MG PO TB24: 50.0000 mg | ORAL_TABLET | Freq: Every day | ORAL | 0 refills | Status: DC

## 2019-07-04 NOTE — Telephone Encounter (Signed)
Late entry- Spoke to patient today regarding blood pressure medication. She request refill. She states she was given 3 pills from pharmacy  Until she was able to come to her appointment on the next day. Patient "no showed" her appointment the next day after she got her refill.  Will forward request to PCP.

## 2019-07-04 NOTE — Telephone Encounter (Signed)
Refills sent until appointment. Last courtesy refill.

## 2019-07-18 ENCOUNTER — Ambulatory Visit: Payer: Self-pay | Attending: Family Medicine | Admitting: Family Medicine

## 2019-07-18 ENCOUNTER — Encounter: Payer: Self-pay | Admitting: Family Medicine

## 2019-07-18 DIAGNOSIS — I10 Essential (primary) hypertension: Secondary | ICD-10-CM

## 2019-07-18 DIAGNOSIS — K2950 Unspecified chronic gastritis without bleeding: Secondary | ICD-10-CM

## 2019-07-18 DIAGNOSIS — K0889 Other specified disorders of teeth and supporting structures: Secondary | ICD-10-CM

## 2019-07-18 DIAGNOSIS — G5601 Carpal tunnel syndrome, right upper limb: Secondary | ICD-10-CM

## 2019-07-18 MED ORDER — OMEPRAZOLE 40 MG PO CPDR: 40.0000 mg | DELAYED_RELEASE_CAPSULE | Freq: Every day | ORAL | 6 refills | Status: DC

## 2019-07-18 MED ORDER — PREDNISONE 20 MG PO TABS: 20.0000 mg | ORAL_TABLET | Freq: Every day | ORAL | 0 refills | Status: DC

## 2019-07-18 MED ORDER — METOPROLOL SUCCINATE ER 50 MG PO TB24: 50.0000 mg | ORAL_TABLET | Freq: Every day | ORAL | 6 refills | Status: DC

## 2019-07-18 MED ORDER — AMLODIPINE BESYLATE 5 MG PO TABS: 5.0000 mg | ORAL_TABLET | Freq: Every day | ORAL | 6 refills | Status: DC

## 2019-07-18 MED ORDER — LISINOPRIL-HYDROCHLOROTHIAZIDE 20-12.5 MG PO TABS: 1.0000 | ORAL_TABLET | Freq: Every day | ORAL | 6 refills | Status: DC

## 2019-07-18 MED ORDER — GABAPENTIN 300 MG PO CAPS: 300.0000 mg | ORAL_CAPSULE | Freq: Two times a day (BID) | ORAL | 3 refills | Status: DC

## 2019-07-18 MED ORDER — AMOXICILLIN 500 MG PO CAPS: 500.0000 mg | ORAL_CAPSULE | Freq: Three times a day (TID) | ORAL | 0 refills | Status: DC

## 2019-07-18 MED FILL — ?METOPROLOL SUCC ER 50MG TA: 50 | 30 days supply | Qty: 30 | Fill #0

## 2019-07-18 MED FILL — LISINOPRIL-HCTZ 20-12.5 MG: 20-12.5 | 30 days supply | Qty: 30 | Fill #0

## 2019-07-18 MED FILL — ?AMLODIPINE BESYLATE 5 MG T: 5 MG | 30 days supply | Qty: 30 | Fill #0

## 2019-07-18 MED FILL — ?OMEPRAZOLE 20MG CAP DR: 20 | 30 days supply | Qty: 60 | Fill #0

## 2019-07-18 MED FILL — GABAPENTIN 300 MG CAPSULE: 300 | 30 days supply | Qty: 60 | Fill #0

## 2019-07-18 MED FILL — predniSONE 20 MG TABS: 20 | 5 days supply | Qty: 5 | Fill #0

## 2019-07-18 MED FILL — ?AMOXICILLIN 500 MG CAPS: 500 | 10 days supply | Qty: 30 | Fill #0

## 2019-07-18 NOTE — Progress Notes (Signed)
Patient has been called and DOB has been verified. Patient has been screened and transferred to PCP to start phone visit.  Patient states that she has had abdominal pain for over a year, she states that every morning at 3 am she is throwing up.  Patient is also having pain in her right hand.  Patient is needing refills on medications.

## 2019-07-18 NOTE — Progress Notes (Signed)
Virtual Visit via Telephone Note  I connected with Jennifer [name], on 07/18/2019 at 8:41 AM by telephone due to the COVID-19 pandemic and verified that I am speaking with the correct person using two identifiers.   Consent: I discussed the limitations, risks, security and privacy concerns of performing an evaluation and management service by telephone and the availability of in person appointments. I also discussed with the patient that there may be a patient responsible charge related to this service. The patient expressed understanding and agreed to proceed.   Location of Patient: Homeless- in a shed  Location of Provider: Clinic   Persons participating in Telemedicine visit: Freja E Navee Percy Farrington-CMA Dr. Margarita Rana     History of Present Illness: 57 year old female with a history of hypertension who is seen today for follow-up visit.   She complains she still has abdominal pain with associated vomiting every morning at 3am and sometimes twice in one morning. States her PPI has been ineffective and feels her body is allergic to food States 'she has no insurance and one is helping her'.  At her last visit I have discussed with her plans for upper endoscopy and advised her to apply for the [facility] health financial discount which she never followed through with. Complains her R hand are numb and she has to 'beat it' for relief.  We had diagnosed carpal tunnel syndrome at her last visit 6 months ago.  Her teeth are decayed and she has had pus coming out of it but has no fever.  She is having to take too many Aleve tablets for pain and is unable to see a dentist due to lack of medical coverage.  She is requesting an antibiotic and something for pain.  Past Medical History:  Diagnosis Date  . bipolar   . Bipolar 1 disorder ([place])   . Depression   . Fibroids    now s/p hysterectomy  . Hypertension   . Left ventricular hypertrophy   . Polysubstance abuse ([place])    IV drug  [place], cocaine on occassion, smoking and alcoholism, has been going to AA, not completely sober   No Known Allergies  Current Outpatient Medications on File Prior to Visit  Medication Sig Dispense Refill  . metoprolol succinate (TOPROL-XL) 50 MG 24 hr tablet Take 1 tablet (50 mg total) by mouth daily. 14 tablet 0  . amLODipine (NORVASC) 5 MG tablet Take 1 tablet (5 mg total) by mouth daily for 1 day. 14 tablet 0  . benzonatate (TESSALON) 100 MG capsule Take 2 capsules (200 mg total) by mouth 3 (three) times daily as needed. (Patient not taking: Reported on 07/18/2019) 40 capsule 0  . lisinopril-hydrochlorothiazide (ZESTORETIC) 20-12.5 MG tablet Take 1 tablet by mouth daily for 1 day. 14 tablet 0  . naproxen (NAPROSYN) 500 MG tablet Take 1 tablet (500 mg total) by mouth 2 (two) times daily with a meal. (Patient not taking: Reported on 07/18/2019) 60 tablet 1  . omeprazole (PRILOSEC) 40 MG capsule Take 1 capsule (40 mg total) by mouth daily. (Patient not taking: Reported on 07/18/2019) 30 capsule 3  . traZODone (DESYREL) 50 MG tablet Take 0.5-1 tablets (25-50 mg total) by mouth at bedtime as needed for sleep. (Patient not taking: Reported on 07/18/2019) 30 tablet 3   No current facility-administered medications on file prior to visit.     Observations/Objective: Awake, alert, oriented x3 Not in acute distress  Assessment and Plan: 1. Essential hypertension Stable Counseled on blood pressure  goal of less than 130/80, low-sodium, DASH diet, medication compliance, 150 minutes of moderate intensity exercise per week. Discussed medication compliance, adverse effects. - metoprolol succinate (TOPROL-XL) 50 MG 24 hr tablet; Take 1 tablet (50 mg total) by mouth daily.  Dispense: 30 tablet; Refill: 6 - amLODipine (NORVASC) 5 MG tablet; Take 1 tablet (5 mg total) by mouth daily for 1 day.  Dispense: 30 tablet; Refill: 6 - lisinopril-hydrochlorothiazide (ZESTORETIC) 20-12.5 MG tablet; Take 1 tablet by mouth  daily for 1 day.  Dispense: 30 tablet; Refill: 6  2. Other chronic gastritis without hemorrhage Uncontrolled She needs to have an upper endoscopy Encouraged to come into the office to apply for the [facility] financial discount to facilitate referral to GI - omeprazole (PRILOSEC) 40 MG capsule; Take 1 capsule (40 mg total) by mouth daily.  Dispense: 30 capsule; Refill: 6 - Ambulatory referral to Gastroenterology  3. Carpal tunnel syndrome of right wrist Advised to obtain wrist brace - gabapentin (NEURONTIN) 300 MG capsule; Take 1 capsule (300 mg total) by mouth 2 (two) times daily.  Dispense: 60 capsule; Refill: 3 - predniSONE (DELTASONE) 20 MG tablet; Take 1 tablet (20 mg total) by mouth daily with breakfast.  Dispense: 5 tablet; Refill: 0  4. Pain, dental - amoxicillin (AMOXIL) 500 MG capsule; Take 1 capsule (500 mg total) by mouth 3 (three) times daily.  Dispense: 30 capsule; Refill: 0   Follow Up Instructions: Return in about 3 months (around 10/16/2019) for chronic medical conditions.    I discussed the assessment and treatment plan with the patient. The patient was provided an opportunity to ask questions and all were answered. The patient agreed with the plan and demonstrated an understanding of the instructions.   The patient was advised to call back or seek an in-person evaluation if the symptoms worsen or if the condition fails to improve as anticipated.     I provided 15 minutes total of non-face-to-face time during this encounter including median intraservice time, reviewing previous notes, labs, imaging, medications, management and patient verbalized understanding.     Charlott Rakes, MD, FAAFP. [facility] and [place], [place]   07/18/2019, 8:41 AM

## 2019-07-20 IMAGING — CR DG CHEST 2V
2 series · 2 of 2 positions shown · non-contrast
Comparison: 06/12/2016

CLINICAL DATA: Upper abdominal pain

EXAM:
CHEST  2 VIEW

[w chest lat]
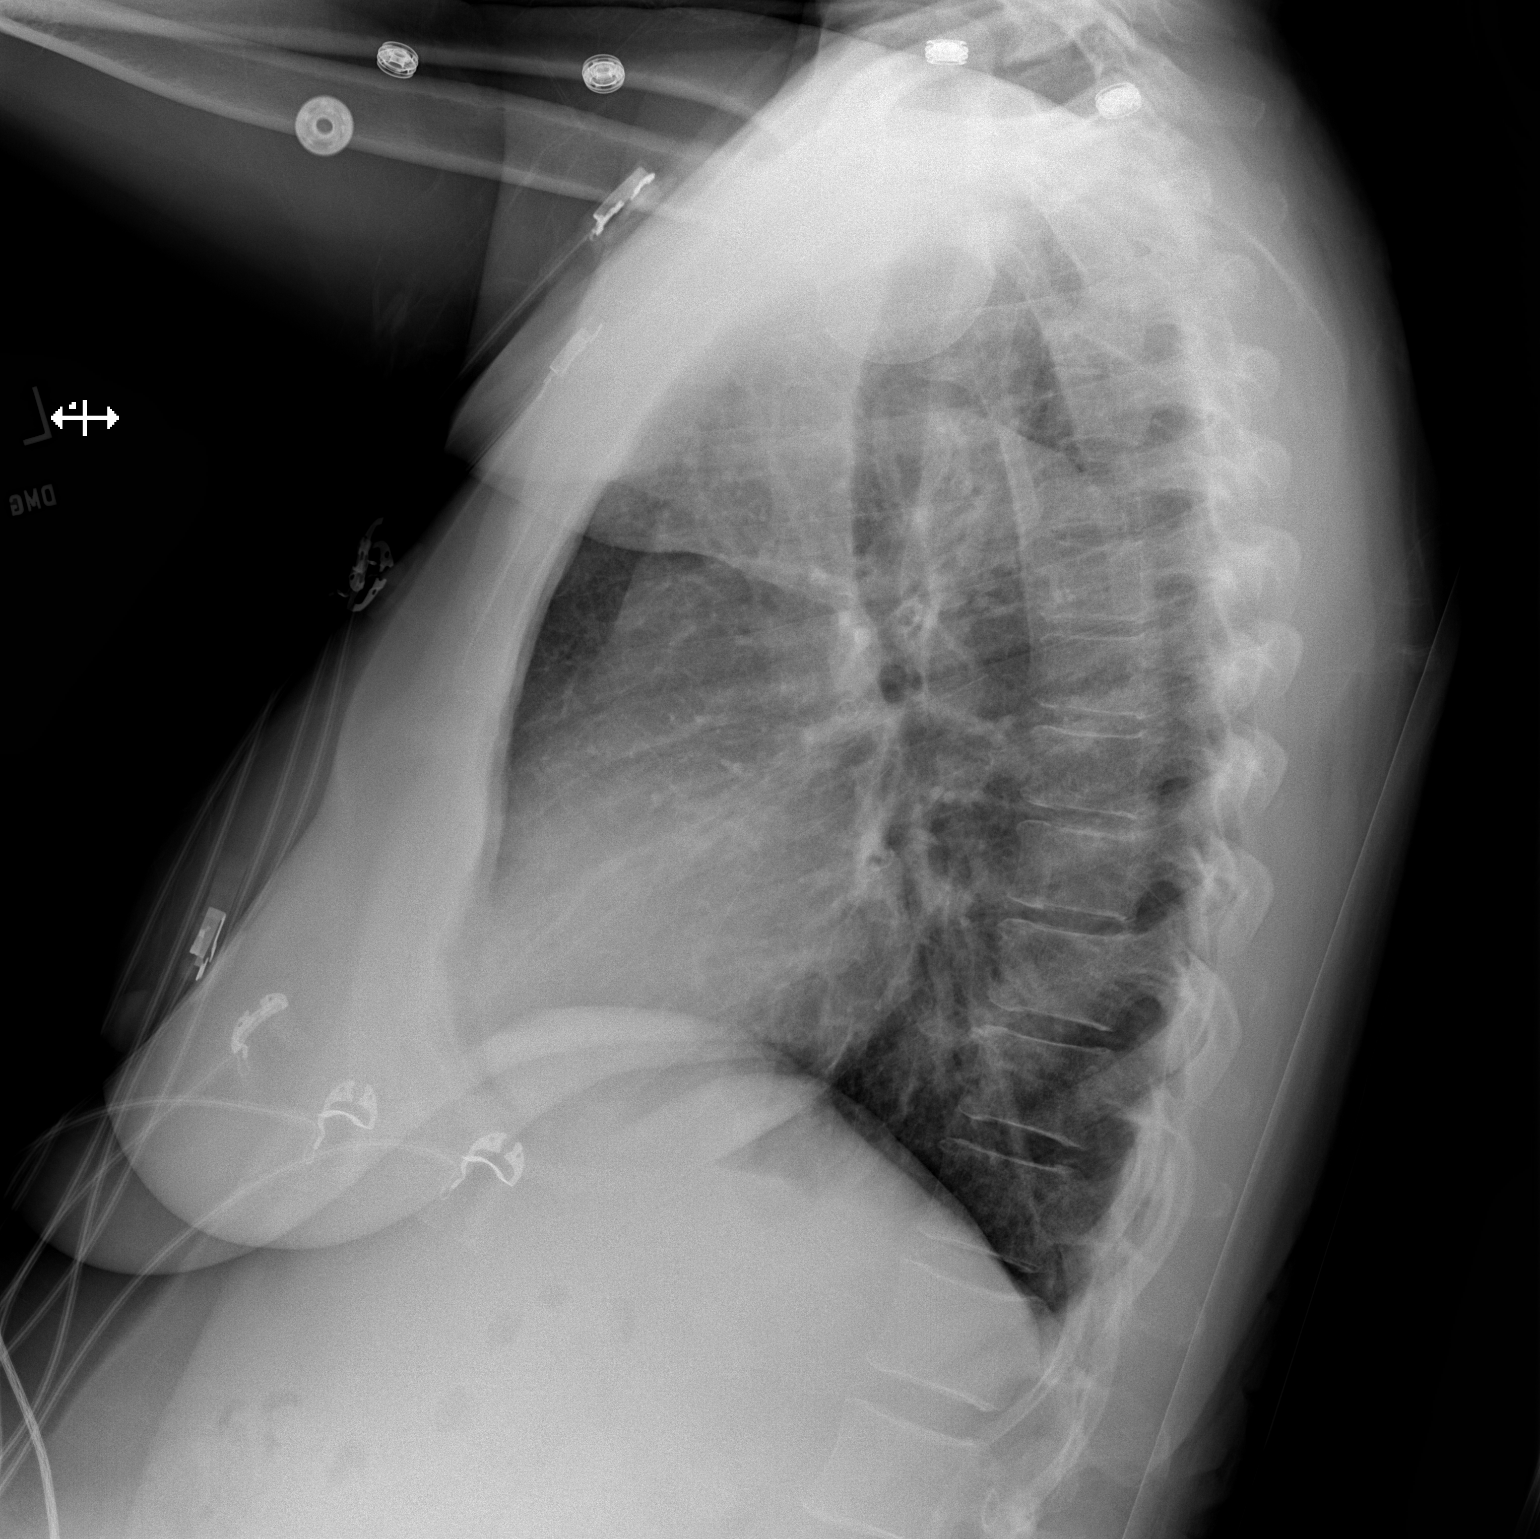

[x chest ap]
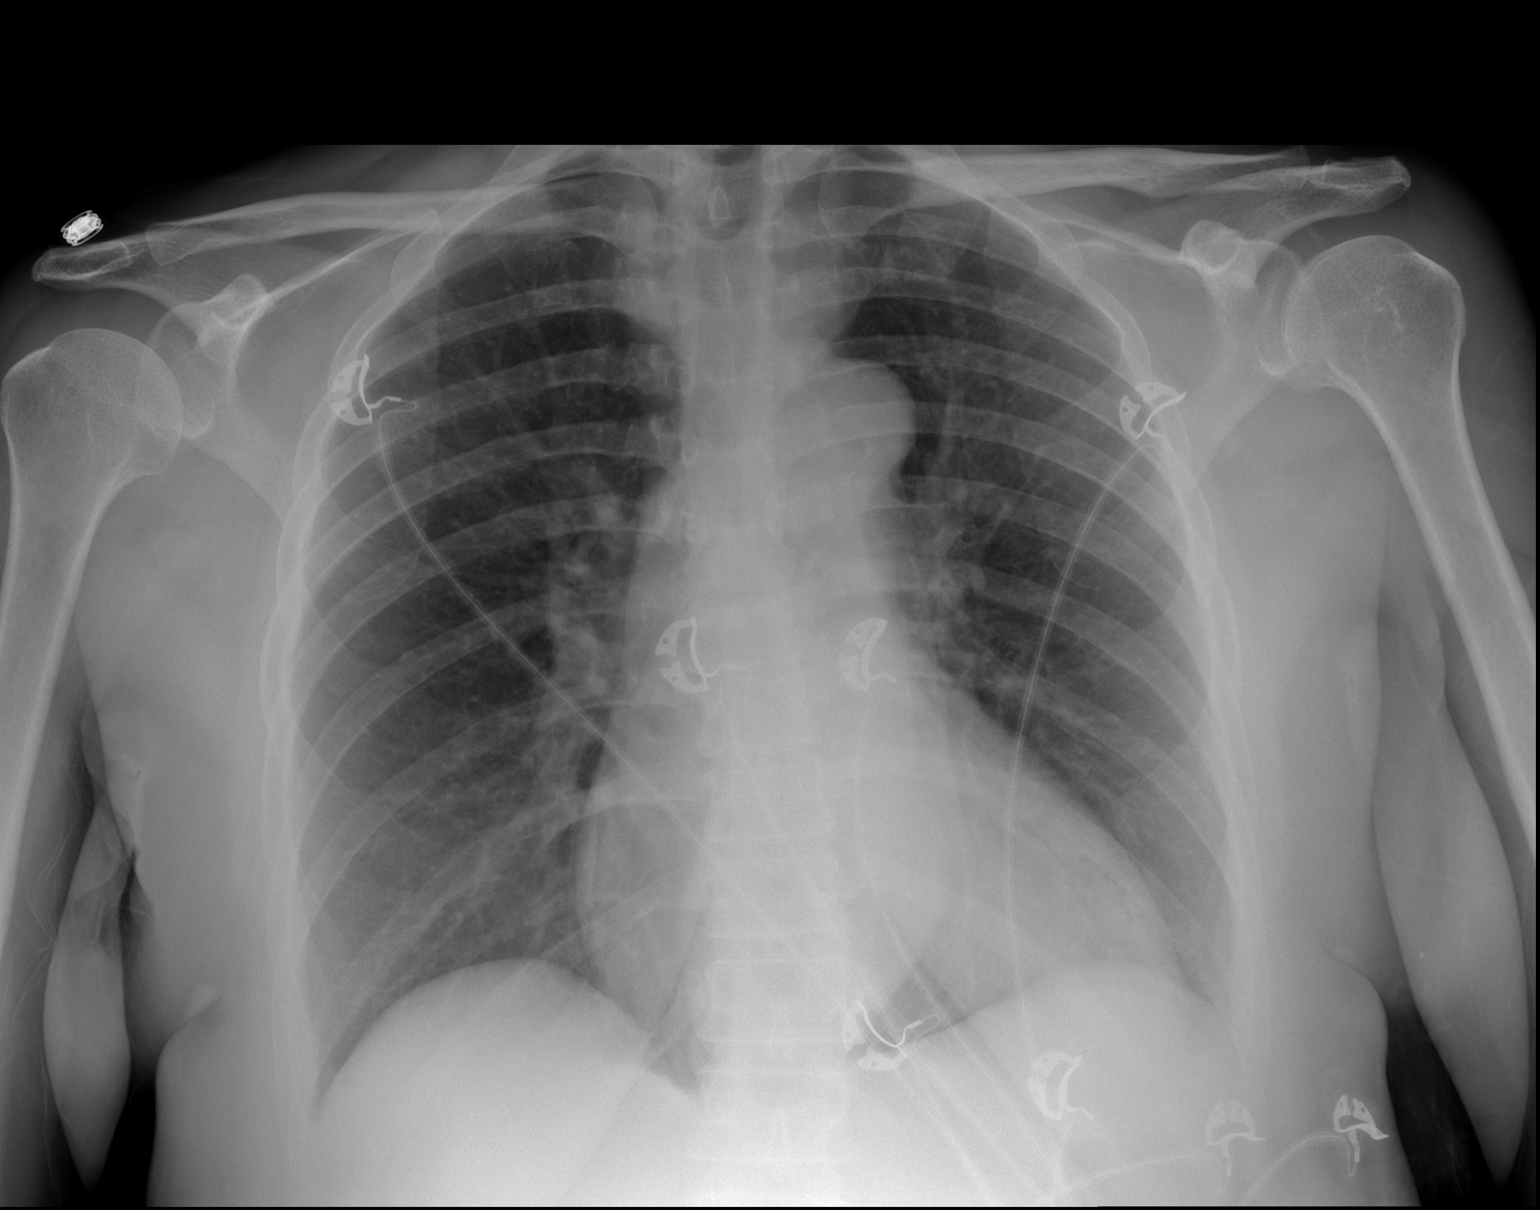

[2 of 2 positions shown; findings below may reference images not displayed]

FINDINGS: Heart is borderline in size. Lungs are clear. No effusions. No acute
bony abnormality.
IMPRESSION: No active cardiopulmonary disease.

## 2019-08-08 MED FILL — predniSONE 20 MG TABS: 20 | 5 days supply | Qty: 5 | Fill #0

## 2019-08-08 MED FILL — GABAPENTIN 300 MG CAPSULE: 300 | 30 days supply | Qty: 60 | Fill #0

## 2019-08-15 MED FILL — AMLODIPINE BESYLATE 5 MG TA: 5 | 30 days supply | Qty: 30 | Fill #1

## 2019-08-15 MED FILL — ?METOPROLOL SUCC ER 50MG TA: 50 | 30 days supply | Qty: 30 | Fill #1

## 2019-08-15 MED FILL — LISINOPRIL-HCTZ 20-12.5 MG: 20-12.5 | 30 days supply | Qty: 30 | Fill #1

## 2019-08-17 ENCOUNTER — Ambulatory Visit: Payer: Self-pay | Attending: Family Medicine | Admitting: Family Medicine

## 2019-08-17 ENCOUNTER — Other Ambulatory Visit: Payer: Self-pay

## 2019-08-17 ENCOUNTER — Encounter: Payer: Self-pay | Admitting: Family Medicine

## 2019-08-17 ENCOUNTER — Ambulatory Visit: Payer: Self-pay

## 2019-08-17 VITALS — BP 130/83 | HR 64 | Temp 98.3°F | Wt 192.0 lb

## 2019-08-17 DIAGNOSIS — K2950 Unspecified chronic gastritis without bleeding: Secondary | ICD-10-CM

## 2019-08-17 DIAGNOSIS — M19041 Primary osteoarthritis, right hand: Secondary | ICD-10-CM

## 2019-08-17 DIAGNOSIS — M19042 Primary osteoarthritis, left hand: Secondary | ICD-10-CM

## 2019-08-17 DIAGNOSIS — N898 Other specified noninflammatory disorders of vagina: Secondary | ICD-10-CM

## 2019-08-17 MED ORDER — MELOXICAM 7.5 MG PO TABS: 7.5000 mg | ORAL_TABLET | Freq: Every day | ORAL | 1 refills | Status: DC

## 2019-08-17 MED ORDER — CLOTRIMAZOLE 1 % EX CREA: 1.0000 "application " | TOPICAL_CREAM | Freq: Two times a day (BID) | CUTANEOUS | 1 refills | Status: DC

## 2019-08-17 MED FILL — MELOXICAM 7.5 MG TABLET: 7.5 | 30 days supply | Qty: 30 | Fill #0

## 2019-08-17 NOTE — Progress Notes (Signed)
Pain in hands and arms. requesting steroid pills for hand pain. Vaginal itching.

## 2019-08-17 NOTE — Patient Instructions (Signed)

## 2019-08-17 NOTE — Progress Notes (Signed)
Subjective:  Patient ID: MYSTICAL STORZ, female    DOB: 25-Aug-1960  Age: 59 y.o. MRN: [id number]  CC: Hypertension   HPI Jennifer [name] is a 59 year old woman with a history of Hypertension seen one month ago with carpal tunnel syndrome of R wrist.  States her hand symptoms have improved and she can make a fist in both hands. She has some stiffness which is residual but is a lot better, symptoms were previously in her R hand but now has them in both hands and she is requesting refill of Prednisone which she previously received. She had previously complained on tingling but now complains of stiffness worse in the morning. She has had itching on her labia and had to use Clorox wipes with some improvement. No vaginal discharge. Her last sexual activity was last month   She feels full and bloated hence she stopped Omeprazole as she assumed that was the cause.Syptoms have been present for over one year with assciated early morning vomiting. She threw up yesterday.Referred to GI but she has no medical insurance and is yet to apply for the [facility] discount despite being advised to do so.  Past Medical History:  Diagnosis Date  . bipolar   . Bipolar 1 disorder ([place])   . Depression   . Fibroids    now s/p hysterectomy  . Hypertension   . Left ventricular hypertrophy   . Polysubstance abuse ([place])    IV drug [place], cocaine on occassion, smoking and alcoholism, has been going to AA, not completely sober    Past Surgical History:  Procedure Laterality Date  . ABDOMINAL HYSTERECTOMY     2003  . BILATERAL SALPINGOOPHORECTOMY     01/2010  . DILATION AND CURETTAGE OF UTERUS    . EXPLORATORY LAPAROTOMY     complex pelvic mass 2011  . TONSILLECTOMY      Family History  Problem Relation Age of Onset  . Hypertension Mother   . Hypertension Sister   . Diabetes Father     No Known Allergies  Outpatient Medications Prior to Visit  Medication Sig Dispense Refill  . gabapentin (NEURONTIN)  300 MG capsule Take 1 capsule (300 mg total) by mouth 2 (two) times daily. 60 capsule 3  . metoprolol succinate (TOPROL-XL) 50 MG 24 hr tablet Take 1 tablet (50 mg total) by mouth daily. 30 tablet 6  . omeprazole (PRILOSEC) 40 MG capsule Take 1 capsule (40 mg total) by mouth daily. 30 capsule 6  . predniSONE (DELTASONE) 20 MG tablet Take 1 tablet (20 mg total) by mouth daily with breakfast. 5 tablet 0  . amLODipine (NORVASC) 5 MG tablet Take 1 tablet (5 mg total) by mouth daily for 1 day. 30 tablet 6  . amoxicillin (AMOXIL) 500 MG capsule Take 1 capsule (500 mg total) by mouth 3 (three) times daily. (Patient not taking: Reported on 08/17/2019) 30 capsule 0  . benzonatate (TESSALON) 100 MG capsule Take 2 capsules (200 mg total) by mouth 3 (three) times daily as needed. (Patient not taking: Reported on 07/18/2019) 40 capsule 0  . lisinopril-hydrochlorothiazide (ZESTORETIC) 20-12.5 MG tablet Take 1 tablet by mouth daily for 1 day. 30 tablet 6  . naproxen (NAPROSYN) 500 MG tablet Take 1 tablet (500 mg total) by mouth 2 (two) times daily with a meal. (Patient not taking: Reported on 07/18/2019) 60 tablet 1  . traZODone (DESYREL) 50 MG tablet Take 0.5-1 tablets (25-50 mg total) by mouth at bedtime as needed for  sleep. (Patient not taking: Reported on 07/18/2019) 30 tablet 3   No facility-administered medications prior to visit.     ROS Review of Systems  Constitutional: Negative for activity change, appetite change and fatigue.  HENT: Negative for congestion, sinus pressure and sore throat.   Eyes: Negative for visual disturbance.  Respiratory: Negative for cough, chest tightness, shortness of breath and wheezing.   Cardiovascular: Negative for chest pain and palpitations.  Gastrointestinal: Positive for abdominal distention and vomiting. Negative for abdominal pain and constipation.  Endocrine: Negative for polydipsia.  Genitourinary: Negative for dysuria and frequency.  Musculoskeletal:       See  HPI  Skin: Negative for rash.  Neurological: Negative for tremors, light-headedness and numbness.  Hematological: Does not bruise/bleed easily.  Psychiatric/Behavioral: Negative for agitation and behavioral problems.    Objective:  BP 130/83   Pulse 64   Temp 98.3 F (36.8 C) (Oral)   Wt 192 lb (87.1 kg)   LMP 12/04/2009   SpO2 98%   BMI 32.96 kg/m   BP/Weight 08/17/2019 06/20/2019 [id number]  Systolic BP [id number]  Diastolic BP 83 [id number]  Wt. (Lbs) 192 - -  BMI 32.96 - -      Physical Exam Constitutional:      Appearance: She is well-developed.  Neck:     Vascular: No JVD.  Cardiovascular:     Rate and Rhythm: Normal rate.     Heart sounds: Normal heart sounds. No murmur.  Pulmonary:     Effort: Pulmonary effort is normal.     Breath sounds: Normal breath sounds. No wheezing or rales.  Chest:     Chest wall: No tenderness.  Abdominal:     General: Bowel sounds are normal. There is no distension.     Palpations: Abdomen is soft. There is no mass.     Tenderness: There is no abdominal tenderness.  Musculoskeletal:        General: Deformity (R middle finger with incomplete extension) present. Normal range of motion.     Right lower leg: No edema.     Left lower leg: No edema.     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  Psychiatric:        Mood and Affect: Mood normal.     CMP Latest Ref Rng & Units 03/28/2019 06/16/2018 02/15/2018  Glucose 70 - 99 mg/dL 91 81 94  BUN 6 - 20 mg/dL 19 16 10   Creatinine 0.44 - 1.00 mg/dL 0.86 0.84 0.81  Sodium 135 - 145 mmol/L 139 138 143  Potassium 3.5 - 5.1 mmol/L 4.4 4.2 4.2  Chloride 98 - 111 mmol/L 106 104 111  CO2 22 - 32 mmol/L 24 26 26   Calcium 8.9 - 10.3 mg/dL 9.5 9.9 9.2  Total Protein 6.5 - 8.1 g/dL - - 6.5  Total Bilirubin 0.3 - 1.2 mg/dL - - 0.5  Alkaline Phos 38 - 126 U/L - - 59    AST 15 - 41 U/L - - 19  ALT 0 - 44 U/L - - 25    Lipid Panel     Component Value Date/Time   CHOL 177 02/06/2009 2025   TRIG 165 (H) 02/06/2009 2025   HDL 45 02/06/2009 2025   CHOLHDL 3.9 Ratio 02/06/2009 2025   VLDL 33 02/06/2009 2025   LDLCALC 99 02/06/2009 2025  CBC    Component Value Date/Time   WBC 4.9 03/28/2019 1205   RBC 4.87 03/28/2019 1205   HGB 14.2 03/28/2019 1205   HCT 44.3 03/28/2019 1205   PLT 207 03/28/2019 1205   MCV 91.0 03/28/2019 1205   MCH 29.2 03/28/2019 1205   MCHC 32.1 03/28/2019 1205   RDW 14.2 03/28/2019 1205   LYMPHSABS 1.9 03/28/2019 1205   MONOABS 0.4 03/28/2019 1205   EOSABS 0.1 03/28/2019 1205   BASOSABS 0.0 03/28/2019 1205    Lab Results  Component Value Date   HGBA1C 5.8 02/06/2009    Assessment & Plan:   1. Primary osteoarthritis of both hands Improved on Prednisone; advised this should not be used chronically - long term side effects discussed Initiate NSAIDS - meloxicam (MOBIC) 7.5 MG tablet; Take 1 tablet (7.5 mg total) by mouth daily.  Dispense: 30 tablet; Refill: 1  2. Vaginal itching - Cervicovaginal ancillary only - clotrimazole (LOTRIMIN) 1 % cream; Apply 1 application topically 2 (two) times daily.  Dispense: 30 g; Refill: 1  3. Other chronic gastritis without hemorrhage Attempted test but she was unable to tolerate this Continue Protonix - H. pylori breath test      Charlott Rakes, MD, FAAFP. [facility] and [place], [place]   08/17/2019, 2:44 PM

## 2019-08-18 LAB — CERVICOVAGINAL ANCILLARY ONLY
Bacterial Vaginitis (gardnerella): POSITIVE — AB
Candida Glabrata: NEGATIVE
Candida Vaginitis: POSITIVE — AB
Chlamydia: NEGATIVE
Comment: NEGATIVE
Comment: NEGATIVE
Comment: NEGATIVE
Comment: NEGATIVE
Comment: NEGATIVE
Comment: NORMAL
Neisseria Gonorrhea: NEGATIVE
Trichomonas: POSITIVE — AB

## 2019-08-19 ENCOUNTER — Other Ambulatory Visit: Payer: Self-pay | Admitting: Family Medicine

## 2019-08-19 ENCOUNTER — Telehealth: Payer: Self-pay

## 2019-08-19 MED ORDER — METRONIDAZOLE 500 MG PO TABS: 500.0000 mg | ORAL_TABLET | Freq: Two times a day (BID) | ORAL | 0 refills | Status: DC

## 2019-08-19 MED FILL — ?METRONIDAZOLE 500 MG TABS: 500 | 7 days supply | Qty: 14 | Fill #0

## 2019-08-19 NOTE — Telephone Encounter (Signed)
Patient name and DOB has been verified Patient was informed of lab results. Patient had no questions.  

## 2019-08-19 NOTE — Telephone Encounter (Signed)
-----   Message from Charlott Rakes, MD sent at 08/19/2019  1:47 PM EST ----- Labs reveal she tested positive for Bacteria vaginosis, yeast infection and Trichomonas (which is a STD) I have sent a prescription for Metronidazole to her pharmacy and her partner will need to be treated as well.

## 2019-09-12 MED FILL — AMLODIPINE BESYLATE 5 MG TA: 5 | 30 days supply | Qty: 30 | Fill #2

## 2019-09-12 MED FILL — ?METOPROLOL SUCC ER 50MG TA: 50 | 30 days supply | Qty: 30 | Fill #2

## 2019-09-12 MED FILL — LISINOPRIL-HCTZ 20-12.5 MG: 20-12.5 | 30 days supply | Qty: 30 | Fill #2

## 2019-09-26 ENCOUNTER — Telehealth: Payer: Self-pay | Admitting: Family Medicine

## 2019-09-26 DIAGNOSIS — G5601 Carpal tunnel syndrome, right upper limb: Secondary | ICD-10-CM

## 2019-09-26 NOTE — Telephone Encounter (Signed)
Patient came in  Jennifer [name]  she is still having hand pain and would like to get a shot or be prescribed a medication patient states that her PCP had prescribed her steroids and it did help with the pain. Please follow up with patient for advice.

## 2019-09-26 NOTE — Telephone Encounter (Signed)
Patient is requesting prednisone for her hand pain.

## 2019-09-27 MED ORDER — PREDNISONE 20 MG PO TABS: 20.0000 mg | ORAL_TABLET | Freq: Every day | ORAL | 0 refills | Status: DC

## 2019-09-27 MED FILL — ?PREDINSONE 10MG TABLETS: 10 | 5 days supply | Qty: 10 | Fill #0

## 2019-09-27 NOTE — Telephone Encounter (Signed)
Voicemail was left informing patient that medication was sent to pharmacy.

## 2019-09-27 NOTE — Telephone Encounter (Signed)
Sent to the Pharmacy

## 2019-10-10 MED FILL — LISINOPRIL-HCTZ 20-12.5 MG: 20-12.5 | 30 days supply | Qty: 30 | Fill #3

## 2019-10-10 MED FILL — ?METOPROLOL SUCC ER 50MG TA: 50 | 30 days supply | Qty: 30 | Fill #3

## 2019-10-10 MED FILL — ?PREDINSONE 10MG TABLETS: 10 | 5 days supply | Qty: 10 | Fill #0

## 2019-10-10 MED FILL — AMLODIPINE BESYLATE 5 MG TA: 5 | 30 days supply | Qty: 30 | Fill #3

## 2019-10-30 ENCOUNTER — Emergency Department (HOSPITAL_COMMUNITY): Admission: EM | Admit: 2019-10-30 | Discharge: 2019-10-30 | Discharge status: Against Medical Advice | Payer: Self-pay

## 2019-10-30 ENCOUNTER — Other Ambulatory Visit: Payer: Self-pay

## 2019-10-30 NOTE — ED Notes (Signed)
Patient decided to follow up with primary care physician tomorrow.

## 2019-10-31 MED FILL — LISINOPRIL-HCTZ 20-12.5 MG: 20-12.5 | 30 days supply | Qty: 30 | Fill #4

## 2019-10-31 MED FILL — AMLODIPINE BESYLATE 5 MG TA: 5 | 30 days supply | Qty: 30 | Fill #4

## 2019-10-31 MED FILL — METOPROLOL SUCCINATE ER 50: 50 | 30 days supply | Qty: 30 | Fill #4

## 2019-11-15 ENCOUNTER — Ambulatory Visit: Payer: Self-pay | Admitting: Family Medicine

## 2019-11-24 MED FILL — LISINOPRIL-HCTZ 20-12.5 MG: 20-12.5 | 30 days supply | Qty: 30 | Fill #5

## 2019-11-24 MED FILL — METOPROLOL SUCCINATE ER 50: 50 | 30 days supply | Qty: 30 | Fill #5

## 2019-11-24 MED FILL — AMLODIPINE BESYLATE 5 MG TA: 5 | 30 days supply | Qty: 30 | Fill #5

## 2019-12-06 ENCOUNTER — Other Ambulatory Visit: Payer: Self-pay | Admitting: Family Medicine

## 2019-12-06 ENCOUNTER — Other Ambulatory Visit: Payer: Self-pay

## 2019-12-06 ENCOUNTER — Encounter: Payer: Self-pay | Admitting: Family Medicine

## 2019-12-06 ENCOUNTER — Ambulatory Visit: Payer: Self-pay | Attending: Family Medicine | Admitting: Family Medicine

## 2019-12-06 DIAGNOSIS — A5901 Trichomonal vulvovaginitis: Secondary | ICD-10-CM

## 2019-12-06 DIAGNOSIS — M19041 Primary osteoarthritis, right hand: Secondary | ICD-10-CM

## 2019-12-06 DIAGNOSIS — B3731 Acute candidiasis of vulva and vagina: Secondary | ICD-10-CM

## 2019-12-06 DIAGNOSIS — M19042 Primary osteoarthritis, left hand: Secondary | ICD-10-CM

## 2019-12-06 DIAGNOSIS — B373 Candidiasis of vulva and vagina: Secondary | ICD-10-CM

## 2019-12-06 DIAGNOSIS — I10 Essential (primary) hypertension: Secondary | ICD-10-CM

## 2019-12-06 MED ORDER — METRONIDAZOLE 500 MG PO TABS: 500.0000 mg | ORAL_TABLET | Freq: Two times a day (BID) | ORAL | 0 refills | Status: DC

## 2019-12-06 MED ORDER — AMLODIPINE BESYLATE 10 MG PO TABS: 10.0000 mg | ORAL_TABLET | Freq: Every day | ORAL | 6 refills | Status: DC

## 2019-12-06 MED ORDER — FLUCONAZOLE 150 MG PO TABS: 150.0000 mg | ORAL_TABLET | Freq: Once | ORAL | 0 refills | Status: AC

## 2019-12-06 MED ORDER — MELOXICAM 7.5 MG PO TABS: 7.5000 mg | ORAL_TABLET | Freq: Every day | ORAL | 1 refills | Status: DC

## 2019-12-06 MED FILL — FLUCONAZOLE 150 MG TABLET: 150 | 1 days supply | Qty: 1 | Fill #0

## 2019-12-06 MED FILL — metroNIDAZOLE 500 MG TABS: 500 | 7 days supply | Qty: 14 | Fill #0

## 2019-12-06 MED FILL — MELOXICAM 7.5 MG TABLET: 7.5 | 30 days supply | Qty: 30 | Fill #0

## 2019-12-06 MED FILL — AMLODIPINE BESYLATE 10 MG T: 10 | 30 days supply | Qty: 30 | Fill #0

## 2019-12-06 NOTE — Progress Notes (Signed)
Virtual Visit via Telephone Note  I connected with Jennifer [name], on 12/06/2019 at 9:57 AM by telephone due to the COVID-19 pandemic and verified that I am speaking with the correct person using two identifiers.   Consent: I discussed the limitations, risks, security and privacy concerns of performing an evaluation and management service by telephone and the availability of in person appointments. I also discussed with the patient that there may be a patient responsible charge related to this service. The patient expressed understanding and agreed to proceed.   Location of Patient: Home  Location of Provider: Clinic   Persons participating in Telemedicine visit: Jaeden E Madalyne Colosimo Farrington-CMA Dr. Margarita Rana     History of Present Illness: Jennifer [name] is a 59 year old woman with a history of Hypertension seen one month ago with carpal tunnel syndrome of R wrist.  She complains her BP has been elevated and she has to take Amlodipine twice daily - she is currently on 5mg . She complains of vaginal itching which she states never went away; she does not have a discharge. Treated for trichomonas and bacterial vaginosis as well as vaginal candidiasis in 08/2019. She informs me she is not sexually active when I inquired if her partner got treated after her infection but her last sexual activity was a week ago.   She complains of numbness in her hands and is wondering if she can receive a shot. She has tingling and numbness in both hands. Currently on meloxicam which she states has been ineffective.  Past Medical History:  Diagnosis Date  . bipolar   . Bipolar 1 disorder ([place])   . Depression   . Fibroids    now s/p hysterectomy  . Hypertension   . Left ventricular hypertrophy   . Polysubstance abuse ([place])    IV drug [place], cocaine on occassion, smoking and alcoholism, has been going to AA, not completely sober   No Known Allergies  Current Outpatient Medications on File  Prior to Visit  Medication Sig Dispense Refill  . metoprolol succinate (TOPROL-XL) 50 MG 24 hr tablet Take 1 tablet (50 mg total) by mouth daily. 30 tablet 6  . amLODipine (NORVASC) 5 MG tablet Take 1 tablet (5 mg total) by mouth daily for 1 day. 30 tablet 6  . amoxicillin (AMOXIL) 500 MG capsule Take 1 capsule (500 mg total) by mouth 3 (three) times daily. (Patient not taking: Reported on 08/17/2019) 30 capsule 0  . benzonatate (TESSALON) 100 MG capsule Take 2 capsules (200 mg total) by mouth 3 (three) times daily as needed. (Patient not taking: Reported on 07/18/2019) 40 capsule 0  . clotrimazole (LOTRIMIN) 1 % cream Apply 1 application topically 2 (two) times daily. (Patient not taking: Reported on 12/06/2019) 30 g 1  . gabapentin (NEURONTIN) 300 MG capsule Take 1 capsule (300 mg total) by mouth 2 (two) times daily. (Patient not taking: Reported on 12/06/2019) 60 capsule 3  . lisinopril-hydrochlorothiazide (ZESTORETIC) 20-12.5 MG tablet Take 1 tablet by mouth daily for 1 day. 30 tablet 6  . meloxicam (MOBIC) 7.5 MG tablet Take 1 tablet (7.5 mg total) by mouth daily. (Patient not taking: Reported on 12/06/2019) 30 tablet 1  . metroNIDAZOLE (FLAGYL) 500 MG tablet Take 1 tablet (500 mg total) by mouth 2 (two) times daily. (Patient not taking: Reported on 12/06/2019) 14 tablet 0  . naproxen (NAPROSYN) 500 MG tablet Take 1 tablet (500 mg total) by mouth 2 (two) times daily with a meal. (Patient not  taking: Reported on 07/18/2019) 60 tablet 1  . omeprazole (PRILOSEC) 40 MG capsule Take 1 capsule (40 mg total) by mouth daily. 30 capsule 6  . predniSONE (DELTASONE) 20 MG tablet Take 1 tablet (20 mg total) by mouth daily with breakfast. 5 tablet 0  . traZODone (DESYREL) 50 MG tablet Take 0.5-1 tablets (25-50 mg total) by mouth at bedtime as needed for sleep. (Patient not taking: Reported on 07/18/2019) 30 tablet 3   No current facility-administered medications on file prior to visit.     Observations/Objective: Awake, alert, oriented x3 Not in acute distress  Assessment and Plan: 1. Essential hypertension Uncontrolled Increase amlodipine from 5 mg to 10 mg - amLODipine (NORVASC) 10 MG tablet; Take 1 tablet (10 mg total) by mouth daily for 1 day.  Dispense: 30 tablet; Refill: 6  2. Primary osteoarthritis of both hands Uncontrolled Refer to orthopedics - meloxicam (MOBIC) 7.5 MG tablet; Take 1 tablet (7.5 mg total) by mouth daily.  Dispense: 30 tablet; Refill: 1 - AMB referral to orthopedics  3. Trichomonas vaginitis Advised her sexual partner needs to be treated We will schedule for test of cure in 1 month - metroNIDAZOLE (FLAGYL) 500 MG tablet; Take 1 tablet (500 mg total) by mouth 2 (two) times daily.  Dispense: 14 tablet; Refill: 0 - Cervicovaginal ancillary only; Future  4. Vaginal candidiasis - fluconazole (DIFLUCAN) 150 MG tablet; Take 1 tablet (150 mg total) by mouth once for 1 dose.  Dispense: 1 tablet; Refill: 0 - Cervicovaginal ancillary only; Future   Follow Up Instructions: Return in about 1 month (around 01/05/2020) for Cervicovaginal ancillary tests; 3 months with PCP for chronic disease management.    I discussed the assessment and treatment plan with the patient. The patient was provided an opportunity to ask questions and all were answered. The patient agreed with the plan and demonstrated an understanding of the instructions.   The patient was advised to call back or seek an in-person evaluation if the symptoms worsen or if the condition fails to improve as anticipated.     I provided 16 minutes total of non-face-to-face time during this encounter including median intraservice time, reviewing previous notes, investigations, ordering medications, medical decision making, coordinating care and patient verbalized understanding at the end of the visit.     Charlott Rakes, MD, FAAFP. [facility] and [facility] [place], [place]   12/06/2019, 9:57 AM

## 2019-12-06 NOTE — Progress Notes (Signed)
States that she is still having sharp pain in hands, states that she can not close her hands.   States that she is having vaginal itching she took the antibiotic and she still has symptoms.

## 2020-01-05 ENCOUNTER — Other Ambulatory Visit: Payer: Self-pay

## 2020-01-12 MED FILL — LISINOPRIL-HCTZ 20-12.5 MG: 20-12.5 | 14 days supply | Qty: 14 | Fill #0

## 2020-01-12 MED FILL — ?AMLODIPINE BESYL 10MG TABL: 10 | 30 days supply | Qty: 30 | Fill #1

## 2020-01-12 MED FILL — METOPROLOL SUCCINATE ER 50: 50 | 14 days supply | Qty: 14 | Fill #0

## 2020-01-23 ENCOUNTER — Other Ambulatory Visit: Payer: Self-pay | Admitting: Family Medicine

## 2020-01-23 ENCOUNTER — Other Ambulatory Visit: Payer: Self-pay

## 2020-01-23 DIAGNOSIS — I10 Essential (primary) hypertension: Secondary | ICD-10-CM

## 2020-01-23 MED ORDER — AMLODIPINE BESYLATE 10 MG PO TABS: 10.0000 mg | ORAL_TABLET | Freq: Every day | ORAL | 6 refills | Status: DC

## 2020-01-23 MED ORDER — METOPROLOL SUCCINATE ER 50 MG PO TB24: 50.0000 mg | ORAL_TABLET | Freq: Every day | ORAL | 2 refills | Status: DC

## 2020-01-23 MED ORDER — LISINOPRIL-HYDROCHLOROTHIAZIDE 20-12.5 MG PO TABS: 1.0000 | ORAL_TABLET | Freq: Every day | ORAL | 2 refills | Status: DC

## 2020-01-23 MED FILL — METOPROLOL SUCCINATE ER 50: 50 | 30 days supply | Qty: 30 | Fill #0

## 2020-01-23 MED FILL — LISINOPRIL-HCTZ 20-12.5 MG: 20-12.5 | 30 days supply | Qty: 30 | Fill #0

## 2020-02-01 MED FILL — AMLODIPINE BESYLATE 10 MG T: 10 | 30 days supply | Qty: 30 | Fill #2

## 2020-02-20 MED FILL — AMLODIPINE BESYLATE 10 MG T: 10 | 30 days supply | Qty: 30 | Fill #3

## 2020-02-20 MED FILL — METOPROLOL SUCCINATE ER 50: 50 | 30 days supply | Qty: 30 | Fill #1

## 2020-02-20 MED FILL — LISINOPRIL-HCTZ 20-12.5 MG: 20-12.5 | 30 days supply | Qty: 30 | Fill #1

## 2020-03-01 ENCOUNTER — Emergency Department (HOSPITAL_COMMUNITY)
Admission: EM | Admit: 2020-03-01 | Discharge: 2020-03-01 | Disposition: A | Discharge status: Home / Self Care | Payer: Self-pay | Attending: Emergency Medicine | Admitting: Emergency Medicine

## 2020-03-01 ENCOUNTER — Ambulatory Visit: Payer: Self-pay | Admitting: Family Medicine

## 2020-03-01 ENCOUNTER — Encounter (HOSPITAL_COMMUNITY): Payer: Self-pay

## 2020-03-01 DIAGNOSIS — R1110 Vomiting, unspecified: Secondary | ICD-10-CM | POA: Insufficient documentation

## 2020-03-01 DIAGNOSIS — K047 Periapical abscess without sinus: Secondary | ICD-10-CM | POA: Insufficient documentation

## 2020-03-01 DIAGNOSIS — Z5321 Procedure and treatment not carried out due to patient leaving prior to being seen by health care provider: Secondary | ICD-10-CM | POA: Insufficient documentation

## 2020-03-01 DIAGNOSIS — R197 Diarrhea, unspecified: Secondary | ICD-10-CM | POA: Insufficient documentation

## 2020-03-01 MED ORDER — SODIUM CHLORIDE 0.9% FLUSH: 3.0000 mL | Freq: Once | INTRAVENOUS | Status: DC

## 2020-03-01 NOTE — ED Triage Notes (Signed)
Pt presents with c/o vomiting and diarrhea as well as a tooth infection. Pt reports her symptoms have been present for 3 days.

## 2020-03-05 ENCOUNTER — Ambulatory Visit: Payer: Self-pay | Attending: Family | Admitting: Family

## 2020-03-05 ENCOUNTER — Encounter: Payer: Self-pay | Admitting: Family

## 2020-03-05 ENCOUNTER — Emergency Department (HOSPITAL_COMMUNITY)
Admission: EM | Admit: 2020-03-05 | Discharge: 2020-03-05 | Disposition: A | Discharge status: Home / Self Care | Payer: Self-pay | Attending: Emergency Medicine | Admitting: Emergency Medicine

## 2020-03-05 VITALS — BP 117/78 | HR 58 | Resp 16 | Wt 186.4 lb

## 2020-03-05 DIAGNOSIS — R609 Edema, unspecified: Secondary | ICD-10-CM | POA: Insufficient documentation

## 2020-03-05 DIAGNOSIS — K0889 Other specified disorders of teeth and supporting structures: Secondary | ICD-10-CM

## 2020-03-05 DIAGNOSIS — Z5321 Procedure and treatment not carried out due to patient leaving prior to being seen by health care provider: Secondary | ICD-10-CM | POA: Insufficient documentation

## 2020-03-05 MED ORDER — AMOXICILLIN-POT CLAVULANATE 875-125 MG PO TABS: 1.0000 | ORAL_TABLET | Freq: Two times a day (BID) | ORAL | 0 refills | Status: DC

## 2020-03-05 MED ORDER — IBUPROFEN 600 MG PO TABS: 600.0000 mg | ORAL_TABLET | Freq: Three times a day (TID) | ORAL | 0 refills | Status: DC | PRN

## 2020-03-05 MED FILL — AMOX-CLAV 875-125 MG TABLET: 875-125 | 10 days supply | Qty: 20 | Fill #0

## 2020-03-05 MED FILL — IBUPROFEN 600 MG TABLET: 600 | 10 days supply | Qty: 30 | Fill #0

## 2020-03-05 NOTE — ED Triage Notes (Signed)
Pt here with L sided dental pain and some associated edema. Pt denies fevers since Tuesday.

## 2020-03-05 NOTE — ED Notes (Signed)
No response for vitas x1.

## 2020-03-05 NOTE — Patient Instructions (Addendum)
Augmentin for dental pain. Referral to Dentistry. Follow-up with primary physician as needed. Dental Pain Dental pain may be caused by many things, including:  Tooth decay (cavities or caries).  Infection.  The inner part of the tooth being filled with pus (abscess).  Injury. Sometimes the cause of pain is unknown. Your pain can vary. It may be mild or severe. You may have it all the time, or it may occur only when you are:  Chewing.  Exposed to hot or cold temperature.  Eating or drinking sugary foods or beverages, such as soda or candy. Follow these instructions at home: Medicines  Take over-the-counter and prescription medicines only as told by your doctor.  If you were prescribed an antibiotic medicine, take it as told by your doctor. Do not stop taking the medicine even if you start to feel better. Eating and drinking  Do not eat foods or drinks that cause you pain. These include: ? Very hot or very cold foods or drinks. ? Sweet or sugary foods or drinks. Managing pain and swelling   Gargle with a salt-water mixture 3-4 times a day. To make this, dissolve -1 tsp of salt in 1 cup of warm water.  If told, put ice on the painful area of your face: ? Put ice in a plastic bag. ? Place a towel between your skin and the bag. ? Leave the ice on for 20 minutes, 2-3 times a day. Brushing your teeth  Brush your teeth twice a day using a fluoride toothpaste.  Floss your teeth once a day.  Use a toothpaste made for sensitive teeth as told by your doctor.  Use a soft toothbrush. General instructions  Do not apply heat to the outside of your face.  Watch your dental pain. Let your doctor know if there are any changes.  Keep all follow-up visits as told by your doctor. This is important. Contact a doctor if:  Your pain is not relieved by medicines.  You have new symptoms.  Your symptoms get worse. Get help right away if:  You cannot open your mouth.  You are  having trouble breathing or swallowing.  You have a fever.  Your face, neck, or jaw is swollen. Summary  Dental pain may be caused by many things, including tooth decay, injury, or infection. In some cases, the cause is not known.  Your pain may be mild or severe. You may have pain all the time, or you may have it only when you eat or drink.  Take over-the-counter and prescription medicines only as told by your doctor.  Watch your dental pain for any changes. Let your doctor know if symptoms get worse. This information is not intended to replace advice given to you by your health care provider. Make sure you discuss any questions you have with your health care provider. Document Revised: 11/23/2018 Document Reviewed: 08/10/2017 Elsevier Patient Education  2020 [employer].

## 2020-03-05 NOTE — Progress Notes (Signed)
Patient ID: JOURDIN GENS, female    DOB: [birth year]  MRN: [id number]  CC: Dental Pain   Subjective: Lark Langenfeld is a 59 y.o. female with history of essential hypertension, bipolar disorder, GERD, anxiety, fibroids uterus, insomnia, leg swelling, and bradycardia who presents for dental pain.  1. DENTAL PAIN: Duration: 7 days Involved teeth: left, upper and lower Dentist evaluation: no Mechanism of injury:  no trauma Onset: sudden Severity: 10/10 Quality:  Frequency: constant Aggravating factors: chewing Alleviating factors: NSAIDs taking Aleve and helps some Status: worse Treatments attempted: NSAIDs Relief with NSAIDs?: mild Fevers: no Swelling: yes Redness: no Paresthesias / decreased sensation: no Sinus pressure: no  Today reports she historically has frequent tooth infections. Reports last tooth infection was 1 year ago. Reports she used to wear partial dentures which caused more tooth infections so she quit wearing them. Reports she went to the emergency department earlier today and was told there was a 14-hour wait and she left to come here. Reports swelling of the left face was worse last week and that she took a sewing kit needle and poked inside of her gums and eventually the swelling came down to where it is today. Requesting antibiotic for infection and Hydrocodone for pain. Reports no other pain medications help.    Patient Active Problem List   Diagnosis Date Noted  . Current smoker 08/12/2016  . Homeless single person 08/12/2016  . Skin lesion of left leg 03/29/2015  . Trigger middle finger of right hand 01/11/2015  . Discoloration of eye 01/11/2015  . Bradycardia 01/11/2015  . Skin lesion of scalp 07/17/2014  . Leg swelling 05/20/2013  . Vasomotor rhinitis 08/17/2012  . Right leg pain 07/26/2012  . Anxiety 02/26/2012  . GERD (gastroesophageal reflux disease) 08/08/2011  . Menopausal symptoms 07/23/2011  . Mastalgia 07/23/2011  . Bipolar  disorder, current episode manic without psychotic features, moderate ([place]) 12/05/2010  . 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 SEBACEOUS CYST, SCALP 05/06/2010  . SYNOVIAL CYST 05/03/2010  . Abdominal pain 12/06/2009  . DRUG ABUSE, HX OF 12/06/2009  . Lipoma of shoulder 02/06/2009  . FIBROIDS, UTERUS 02/06/2009  . DRUG ABUSE 02/06/2009  . Essential hypertension, benign 02/06/2009  . PAIN IN JOINT, SHOULDER REGION 02/06/2009  . Insomnia 02/06/2009  . PELVIC MASS 02/06/2009     Current Outpatient Medications on File Prior to Visit  Medication Sig Dispense Refill  . amLODipine (NORVASC) 10 MG tablet Take 1 tablet (10 mg total) by mouth daily for 1 day. 30 tablet 6  . gabapentin (NEURONTIN) 300 MG capsule Take 1 capsule (300 mg total) by mouth 2 (two) times daily. (Patient not taking: Reported on 12/06/2019) 60 capsule 3  . lisinopril-hydrochlorothiazide (ZESTORETIC) 20-12.5 MG tablet TAKE 1 TABLET BY MOUTH DAILY 30 tablet 0  . meloxicam (MOBIC) 7.5 MG tablet Take 1 tablet (7.5 mg total) by mouth daily. 30 tablet 1  . metoprolol succinate (TOPROL-XL) 50 MG 24 hr tablet TAKE 1 TABLET (50 MG TOTAL) BY MOUTH DAILY. 30 tablet 0  . metroNIDAZOLE (FLAGYL) 500 MG tablet Take 1 tablet (500 mg total) by mouth 2 (two) times daily. (Patient not taking: Reported on 03/05/2020) 14 tablet 0  . omeprazole (PRILOSEC) 40 MG capsule Take 1 capsule (40 mg total) by mouth daily. 30 capsule 6  . traZODone (DESYREL) 50 MG tablet Take 0.5-1 tablets (25-50 mg total) by mouth at bedtime as needed for sleep. (Patient not taking: Reported on 07/18/2019) 30 tablet 3   No current  facility-administered medications on file prior to visit.    No Known Allergies  Social History   Socioeconomic History  . Marital status: Single    Spouse name: Not on file  . Number of children: 0  . Years of education: Not on file  . Highest education level: Some college, no degree  Occupational History  . Not on file  Tobacco Use  .  Smoking status: Current Every Day Smoker    Packs/day: 0.50    Years: 20.00    Pack years: 10.00    Types: Cigarettes  . Smokeless tobacco: Never Used  . Tobacco comment: Wants to quit.   Vaping Use  . Vaping Use: Never used  Substance and Sexual Activity  . Alcohol use: Yes    Alcohol/week: 0.0 standard drinks  . Drug use: Not Currently    Types: Cocaine, Marijuana  . Sexual activity: Not on file  Other Topics Concern  . Not on file  Social History Narrative   Financial assistance approved for 100% discount at [facility] and has [facility] card per Bonna Gains   07/19/2010         Social Determinants of Health   Financial Resource Strain:   . Difficulty of Paying Living Expenses:   Food Insecurity:   . Worried About Charity fundraiser in the Last Year:   . Arboriculturist in the Last Year:   Transportation Needs:   . Film/video editor (Medical):   [name] Lack of Transportation (Non-Medical):   Physical Activity:   . Days of Exercise per Week:   . Minutes of Exercise per Session:   Stress:   . Feeling of Stress :   Social Connections:   . Frequency of Communication with Friends and Family:   . Frequency of Social Gatherings with Friends and Family:   . Attends Religious Services:   . Active Member of Clubs or Organizations:   . Attends Archivist Meetings:   [name] Marital Status:   Intimate Partner Violence:   . Fear of Current or Ex-Partner:   . Emotionally Abused:   [name] Physically Abused:   . Sexually Abused:     Family History  Problem Relation Age of Onset  . Hypertension Mother   . Hypertension Sister   . Diabetes Father     Past Surgical History:  Procedure Laterality Date  . ABDOMINAL HYSTERECTOMY     2003  . BILATERAL SALPINGOOPHORECTOMY     01/2010  . DILATION AND CURETTAGE OF UTERUS    . EXPLORATORY LAPAROTOMY     complex pelvic mass 2011  . TONSILLECTOMY      ROS: Review of Systems Negative except as stated above  PHYSICAL EXAM: BP 117/78    Pulse 58   Resp 16   Wt 186 lb 6.4 oz (84.6 kg)   LMP 12/04/2009   SpO2 96%   BMI 32.00 kg/m   Physical Exam General appearance - alert, well appearing, and in no distress and uncooperative Mental status - alert, oriented to person, place, and time, agitated, uncooperative Ears - bilateral TM's and external ear canals normal Nose - normal and patent, no erythema, discharge or polyps Mouth - dental hygiene poor, edentulous and tongue normal Neck - supple, no significant adenopathy Lymphatics - no palpable lymphadenopathy, no hepatosplenomegaly Chest - clear to auscultation, no wheezes, rales or rhonchi, symmetric air entry, no tachypnea, retractions or cyanosis  CMP Latest Ref Rng & Units 03/28/2019 06/16/2018 02/15/2018  Glucose 70 -  99 mg/dL 91 81 94  BUN 6 - 20 mg/dL 19 16 10   Creatinine 0.44 - 1.00 mg/dL 0.86 0.84 0.81  Sodium 135 - 145 mmol/L 139 138 143  Potassium 3.5 - 5.1 mmol/L 4.4 4.2 4.2  Chloride 98 - 111 mmol/L 106 104 111  CO2 22 - 32 mmol/L 24 26 26   Calcium 8.9 - 10.3 mg/dL 9.5 9.9 9.2  Total Protein 6.5 - 8.1 g/dL - - 6.5  Total Bilirubin 0.3 - 1.2 mg/dL - - 0.5  Alkaline Phos 38 - 126 U/L - - 59  AST 15 - 41 U/L - - 19  ALT 0 - 44 U/L - - 25   Lipid Panel     Component Value Date/Time   CHOL 177 02/06/2009 2025   TRIG 165 (H) 02/06/2009 2025   HDL 45 02/06/2009 2025   CHOLHDL 3.9 Ratio 02/06/2009 2025   VLDL 33 02/06/2009 2025   LDLCALC 99 02/06/2009 2025    CBC    Component Value Date/Time   WBC 4.9 03/28/2019 1205   RBC 4.87 03/28/2019 1205   HGB 14.2 03/28/2019 1205   HCT 44.3 03/28/2019 1205   PLT 207 03/28/2019 1205   MCV 91.0 03/28/2019 1205   MCH 29.2 03/28/2019 1205   MCHC 32.1 03/28/2019 1205   RDW 14.2 03/28/2019 1205   LYMPHSABS 1.9 03/28/2019 1205   MONOABS 0.4 03/28/2019 1205   EOSABS 0.1 03/28/2019 1205   BASOSABS 0.0 03/28/2019 1205    ASSESSMENT AND PLAN: 1. Pain, dental: -Patient today with left upper and lower dental  pain for 7 days. Reports she has frequent tooth infections the last one being 1 year ago. Reports she is using a sewing kit needle to poke her gums and that the swelling has went down some since then. Counseled patient that this is not advised as this can increase infection.  -Amoxicillin-clavulanate for dental pain caused by likely infection. -Ibuprofen as needed for dental pain. Counseled patient that the requested Hydrocodone for pain will not be prescribed as this is not indicated. -Counseled patient that she should be referred to Dentistry for further evaluation and management. Patient declined explaining that she is unable to go as she is uninsured. Offered patient application for [facility] financial discount/orange card. Reports she is planning to submit application soon at which point referral to Dentistry can be made. -Follow-up as needed with primary physician.  - amoxicillin-clavulanate (AUGMENTIN) 875-125 MG tablet; Take 1 tablet by mouth 2 (two) times daily.  Dispense: 20 tablet; Refill: 0 - ibuprofen (ADVIL) 600 MG tablet; Take 1 tablet (600 mg total) by mouth every 8 (eight) hours as needed.  Dispense: 30 tablet; Refill: 0  Julis Haubner Zachery Dauer, NP

## 2020-03-21 MED FILL — LISINOPRIL-HCTZ 20-12.5 MG: 20-12.5 | 30 days supply | Qty: 30 | Fill #2

## 2020-03-21 MED FILL — METOPROLOL SUCCINATE ER 50: 50 | 30 days supply | Qty: 30 | Fill #2

## 2020-03-21 MED FILL — AMLODIPINE BESYLATE 10 MG T: 10 | 30 days supply | Qty: 30 | Fill #4

## 2020-03-27 ENCOUNTER — Ambulatory Visit: Payer: Self-pay | Admitting: Family Medicine

## 2020-04-17 MED FILL — LISINOPRIL-HCTZ 20-12.5 MG: 20-12.5 | 30 days supply | Qty: 30 | Fill #0

## 2020-04-17 MED FILL — AMLODIPINE BESYLATE 10 MG T: 10 | 30 days supply | Qty: 30 | Fill #5

## 2020-04-17 MED FILL — METOPROLOL SUCCINATE ER 50: 50 | 30 days supply | Qty: 30 | Fill #0

## 2020-05-02 ENCOUNTER — Ambulatory Visit: Payer: Self-pay | Admitting: Family Medicine

## 2020-05-11 ENCOUNTER — Other Ambulatory Visit: Payer: Self-pay | Admitting: Family Medicine

## 2020-05-11 DIAGNOSIS — I10 Essential (primary) hypertension: Secondary | ICD-10-CM

## 2020-05-11 NOTE — Telephone Encounter (Signed)
Requested Prescriptions  Pending Prescriptions Disp Refills   metoprolol succinate (TOPROL-XL) 50 MG 24 hr tablet [Pharmacy Med Name: METOPROLOL SUCCINATE ER 50 50 Tablet] 30 tablet 0    Sig: TAKE 1 TABLET (50 MG TOTAL) BY MOUTH DAILY.     Cardiovascular:  Beta Blockers Passed - 05/11/2020 10:46 AM      Passed - Last BP in normal range    BP Readings from Last 1 Encounters:  03/05/20 109/71         Passed - Last Heart Rate in normal range    Pulse Readings from Last 1 Encounters:  03/05/20 63         Passed - Valid encounter within last 6 months    Recent Outpatient Visits          2 months ago Pain, dental   [place], Amy J, NP   5 months ago Trichomonas vaginitis   [place], Charlane Ferretti, MD   8 months ago Primary osteoarthritis of both hands   [place], Charlane Ferretti, MD   9 months ago Carpal tunnel syndrome of right wrist   [place], Charlane Ferretti, MD   1 year ago Carpal tunnel syndrome of right wrist   [place], Charlane Ferretti, MD              lisinopril-hydrochlorothiazide (ZESTORETIC) 20-12.5 MG tablet [Pharmacy Med Name: LISINOPRIL-HCTZ 20-12.5 MG 20-12.5 Tablet] 30 tablet 0    Sig: TAKE 1 TABLET BY MOUTH DAILY     Cardiovascular:  ACEI + Diuretic Combos Failed - 05/11/2020 10:46 AM      Failed - Na in normal range and within 180 days    Sodium  Date Value Ref Range Status  03/28/2019 139 135 - 145 mmol/L Final         Failed - K in normal range and within 180 days    Potassium  Date Value Ref Range Status  03/28/2019 4.4 3.5 - 5.1 mmol/L Final         Failed - Cr in normal range and within 180 days    Creat  Date Value Ref Range Status  09/30/2016 1.15 (H) 0.50 - 1.05 mg/dL Final    Comment:      For patients > or = 57 years of age: The upper reference limit for Creatinine  is approximately 13% higher for people identified as African-American.      Creatinine, Ser  Date Value Ref Range Status  03/28/2019 0.86 0.44 - 1.00 mg/dL Final   Creatinine,U  Date Value Ref Range Status  12/15/2009  mg/dL Final   87.6 (NOTE)  Cutoff Values for Urine Drug Screen:        Drug Class           Cutoff (ng/mL)        Amphetamines            1000        Barbiturates             200        Cocaine Metabolites      300        Benzodiazepines          200        Methadone                 300  Opiates                 2000        Phencyclidine             25        Propoxyphene             300        Marijuana Metabolites     50  For medical purposes only.   Creatinine, Urine  Date Value Ref Range Status  08/08/2011 223.4 20 OR > MG/DL Final         Failed - Ca in normal range and within 180 days    Calcium  Date Value Ref Range Status  03/28/2019 9.5 8.9 - 10.3 mg/dL Final   Calcium, Ion  Date Value Ref Range Status  02/11/2016 1.16 1.13 - 1.30 mmol/L Final         Passed - Patient is not pregnant      Passed - Last BP in normal range    BP Readings from Last 1 Encounters:  03/05/20 109/71         Passed - Valid encounter within last 6 months    Recent Outpatient Visits          2 months ago Pain, dental   [place], [place], NP   5 months ago Trichomonas vaginitis   [place], [place], MD   8 months ago Primary osteoarthritis of both hands   [place], [place], MD   9 months ago Carpal tunnel syndrome of right wrist   [place], [place]ong, MD   1 year ago Carpal tunnel syndrome of right wrist   [facility] Charlott Rakes, MD

## 2020-05-14 MED FILL — AMLODIPINE BESYLATE 10 MG T: 10 | 30 days supply | Qty: 30 | Fill #6

## 2020-05-14 MED FILL — METOPROLOL SUCCINATE ER 50: 50 | 30 days supply | Qty: 30 | Fill #0

## 2020-05-14 MED FILL — LISINOPRIL-HCTZ 20-12.5 MG: 20-12.5 | 30 days supply | Qty: 30 | Fill #0

## 2020-05-16 ENCOUNTER — Encounter: Payer: Self-pay | Admitting: Nurse Practitioner

## 2020-05-16 ENCOUNTER — Other Ambulatory Visit: Payer: Self-pay

## 2020-05-16 ENCOUNTER — Ambulatory Visit: Payer: Self-pay | Attending: Nurse Practitioner | Admitting: Nurse Practitioner

## 2020-05-16 ENCOUNTER — Other Ambulatory Visit: Payer: Self-pay | Admitting: Nurse Practitioner

## 2020-05-16 VITALS — BP 116/83 | HR 58 | Temp 97.7°F | Ht 64.0 in | Wt 182.0 lb

## 2020-05-16 DIAGNOSIS — Z7689 Persons encountering health services in other specified circumstances: Secondary | ICD-10-CM

## 2020-05-16 DIAGNOSIS — K029 Dental caries, unspecified: Secondary | ICD-10-CM

## 2020-05-16 DIAGNOSIS — M79642 Pain in left hand: Secondary | ICD-10-CM

## 2020-05-16 DIAGNOSIS — M79641 Pain in right hand: Secondary | ICD-10-CM

## 2020-05-16 MED ORDER — CHLORHEXIDINE GLUCONATE 0.12 % MT SOLN: 15.0000 mL | Freq: Two times a day (BID) | OROMUCOSAL | 0 refills | Status: DC

## 2020-05-16 MED ORDER — KETOROLAC TROMETHAMINE 30 MG/ML IJ SOLN: 30.0000 mg | Freq: Once | INTRAMUSCULAR | Status: DC

## 2020-05-16 MED ORDER — PENICILLIN V POTASSIUM 500 MG PO TABS: 500.0000 mg | ORAL_TABLET | Freq: Four times a day (QID) | ORAL | 0 refills | Status: DC

## 2020-05-16 MED ORDER — KETOROLAC TROMETHAMINE 60 MG/2ML IM SOLN
30.0000 mg | Freq: Once | INTRAMUSCULAR | Status: AC
  Administered 2020-05-16: 30 mg via INTRAMUSCULAR

## 2020-05-16 MED FILL — CHLORHEXIDINE 0.12% RINSE: 0.12 | 16 days supply | Qty: 473 | Fill #0

## 2020-05-16 MED FILL — PENICILLIN VK 500 MG TABLET: 500 | 7 days supply | Qty: 28 | Fill #0

## 2020-05-16 NOTE — Progress Notes (Signed)
Assessment & Plan:  Jennifer [name] was seen today for hand pain and dental pain.  Diagnoses and all orders for this visit:  Bilateral hand pain Advised to not take Ibuprofen, aleve, advil, motrin or naproxen for 5 days Can take XS tylenol for pain  -     ketorolac (TORADOL) injection 30 mg  Pain due to dental caries -     penicillin v potassium (VEETID) 500 MG tablet; Take 1 tablet (500 mg total) by mouth 4 (four) times daily for 7 days. -     chlorhexidine (PERIDEX) 0.12 % solution; Use as directed 15 mLs in the mouth or throat 2 (two) times daily.  Encounter for assessment of STD exposure -     Cervicovaginal ancillary only Wants to be "Checked for everything". She denies being told by her partner that they may have exposed her to any STDs recently  Patient has been counseled on age-appropriate routine health concerns for screening and prevention. These are reviewed and up-to-date. Referrals have been placed accordingly. Immunizations are up-to-date or declined.    Subjective:   Chief Complaint  Patient presents with   Hand Pain    Pt. stated she is having both hand pain and more pain on the right hand.    Dental Pain    Pt. stated she is having dental pain on her right side.    HPI Jennifer [name] 59 y.o. female presents to office today with complaints of bilateral hand pain and dental pain which she believes to be an abscess.  PMH: Bipolar 1 disorder, Depression, Fibroids, HTN, LVH and Polysubstance abuse ([place]).  Dental Pain States she can not afford to see a dentist and has to be treated often "for my abscesses". Symptoms include: "Green pus" when she presses on the right lower gumline. I am unable to appreciate any expressible purulent drainage even with firm palpation of reported area. She has been taking ibuprofen for pain with no relief of symptoms. She denies fevers, periorbital edema, lymphadenopathy, otalgia. She does endorse lower jaw swelling which I did not visualize  today. She states "it comes and goes". Pain 10/10.   Joint Pain Patient complains of arthralgias for which has been present for several months. She has a history of right carpal tunnel. Pain is located in both wrist(s) and both hands, is described as aching, sharp, shooting and tingling, and is constant, moderate .  Associated symptoms include: decreased range of motion and in the mornings upon awakening.  The patient has tried NSAIDs OTC and prescription as well as meloxicam for pain with minimal relief.  Related to injury:no. States she wants a steroid shot today.  "I know yall give shots around here cause I heard that white doctor tell somebody to give his patient a steroid shot".   Review of Systems  Constitutional: Negative for fever, malaise/fatigue and weight loss.  HENT: Negative for nosebleeds.        SEE HPI  Eyes: Negative.  Negative for blurred vision, double vision and photophobia.  Respiratory: Negative.  Negative for cough and shortness of breath.   Cardiovascular: Negative.  Negative for chest pain, palpitations and leg swelling.  Gastrointestinal: Negative.  Negative for heartburn, nausea and vomiting.  Musculoskeletal: Positive for joint pain. Negative for myalgias.  Neurological: Negative.  Negative for dizziness, focal weakness, seizures and headaches.  Psychiatric/Behavioral: Negative for suicidal ideas.    Past Medical History:  Diagnosis Date   bipolar    Bipolar 1 disorder ([place])  Depression    Fibroids    now s/p hysterectomy   Hypertension    Left ventricular hypertrophy    Polysubstance abuse ([place])    IV drug [place], cocaine on occassion, smoking and alcoholism, has been going to AA, not completely sober    Past Surgical History:  Procedure Laterality Date   ABDOMINAL HYSTERECTOMY     2003   BILATERAL SALPINGOOPHORECTOMY     01/2010   DILATION AND CURETTAGE OF UTERUS     EXPLORATORY LAPAROTOMY     complex pelvic mass 2011   TONSILLECTOMY        Family History  Problem Relation Age of Onset   Hypertension Mother    Hypertension Sister    Diabetes Father     Social History Reviewed with no changes to be made today.   Outpatient Medications Prior to Visit  Medication Sig Dispense Refill   lisinopril-hydrochlorothiazide (ZESTORETIC) 20-12.5 MG tablet TAKE 1 TABLET BY MOUTH DAILY 30 tablet 0   metoprolol succinate (TOPROL-XL) 50 MG 24 hr tablet TAKE 1 TABLET (50 MG TOTAL) BY MOUTH DAILY. 30 tablet 0   omeprazole (PRILOSEC) 40 MG capsule Take 1 capsule (40 mg total) by mouth daily. 30 capsule 6   amoxicillin-clavulanate (AUGMENTIN) 875-125 MG tablet Take 1 tablet by mouth 2 (two) times daily. 20 tablet 0   ibuprofen (ADVIL) 600 MG tablet Take 1 tablet (600 mg total) by mouth every 8 (eight) hours as needed. 30 tablet 0   meloxicam (MOBIC) 7.5 MG tablet Take 1 tablet (7.5 mg total) by mouth daily. 30 tablet 1   amLODipine (NORVASC) 10 MG tablet Take 1 tablet (10 mg total) by mouth daily for 1 day. 30 tablet 6   gabapentin (NEURONTIN) 300 MG capsule Take 1 capsule (300 mg total) by mouth 2 (two) times daily. (Patient not taking: Reported on 12/06/2019) 60 capsule 3   metroNIDAZOLE (FLAGYL) 500 MG tablet Take 1 tablet (500 mg total) by mouth 2 (two) times daily. (Patient not taking: Reported on 03/05/2020) 14 tablet 0   traZODone (DESYREL) 50 MG tablet Take 0.5-1 tablets (25-50 mg total) by mouth at bedtime as needed for sleep. (Patient not taking: Reported on 07/18/2019) 30 tablet 3   No facility-administered medications prior to visit.    No Known Allergies     Objective:    BP 116/83 (BP Location: Left Arm, Patient Position: Sitting, Cuff Size: Normal)    Pulse (!) 58    Temp 97.7 F (36.5 C) (Temporal)    Ht 5\' 4"  (1.626 m)    Wt 182 lb (82.6 kg)    LMP 12/04/2009    SpO2 98%    BMI 31.24 kg/m  Wt Readings from Last 3 Encounters:  05/16/20 182 lb (82.6 kg)  03/05/20 186 lb 6.4 oz (84.6 kg)  08/17/19 192 lb  (87.1 kg)    Physical Exam Vitals and nursing note reviewed.  Constitutional:      Appearance: She is well-developed.  HENT:     Head: Normocephalic and atraumatic.     Mouth/Throat:     Dentition: Abnormal dentition. Does not have dentures. Dental tenderness and dental caries present. No gingival swelling, dental abscesses or gum lesions.     Tongue: No lesions.     Palate: No mass and lesions.  Cardiovascular:     Rate and Rhythm: Normal rate and regular rhythm.     Heart sounds: Normal heart sounds. No murmur heard.  No friction rub. No gallop.  Pulmonary:     Effort: Pulmonary effort is normal. No tachypnea or respiratory distress.     Breath sounds: Normal breath sounds. No decreased breath sounds, wheezing, rhonchi or rales.  Abdominal:     General: Bowel sounds are normal.     Palpations: Abdomen is soft.  Musculoskeletal:        General: Normal range of motion.     Cervical back: Normal range of motion.  Lymphadenopathy:     Cervical: No cervical adenopathy.  Skin:    General: Skin is warm and dry.  Neurological:     Mental Status: She is alert and oriented to person, place, and time.     Coordination: Coordination normal.  Psychiatric:        Behavior: Behavior normal. Behavior is cooperative.        Thought Content: Thought content normal.        Judgment: Judgment normal.          Patient has been counseled extensively about nutrition and exercise as well as the importance of adherence with medications and regular follow-up. The patient was given clear instructions to go to ER or return to medical center if symptoms don't improve, worsen or new problems develop. The patient verbalized understanding.   Follow-up: Return in about 3 months (around 08/16/2020) for with PCP .   Gildardo Pounds, FNP-BC [facility] and [place], [place]   05/16/2020, 10:24 PM

## 2020-05-17 LAB — CERVICOVAGINAL ANCILLARY ONLY
Bacterial Vaginitis (gardnerella): POSITIVE — AB
Candida Glabrata: NEGATIVE
Candida Vaginitis: NEGATIVE
Chlamydia: POSITIVE — AB
Comment: NEGATIVE
Comment: NEGATIVE
Comment: NEGATIVE
Comment: NEGATIVE
Comment: NEGATIVE
Comment: NORMAL
Neisseria Gonorrhea: NEGATIVE
Trichomonas: NEGATIVE

## 2020-05-21 ENCOUNTER — Other Ambulatory Visit: Payer: Self-pay | Admitting: Nurse Practitioner

## 2020-05-21 MED ORDER — DOXYCYCLINE HYCLATE 100 MG PO TABS
100.0000 mg | ORAL_TABLET | Freq: Two times a day (BID) | ORAL | 0 refills | Status: DC
Start: 2020-05-21 — End: 2020-05-21

## 2020-05-21 MED ORDER — METRONIDAZOLE 500 MG PO TABS: 500.0000 mg | ORAL_TABLET | Freq: Two times a day (BID) | ORAL | 0 refills | Status: DC

## 2020-05-22 MED FILL — ?METRONIDAZOLE 500 MG TABS: 500 | 7 days supply | Qty: 14 | Fill #0

## 2020-05-22 MED FILL — DOXYCYCLINE HYCLATE 100 MG: 100 | 7 days supply | Qty: 14 | Fill #0

## 2020-05-25 ENCOUNTER — Telehealth: Payer: Self-pay | Admitting: Family Medicine

## 2020-05-25 NOTE — Telephone Encounter (Signed)
Pt. Returned call to nurse to receive recent lab results.  Advised of result note per Geryl Rankins from 05/21/20.  Questions answered.  Emphasized need to start antibiotics, and to inform partner of need to be tested.  Pt. Verb. Understanding.

## 2020-05-29 ENCOUNTER — Ambulatory Visit: Payer: Self-pay | Attending: Nurse Practitioner | Admitting: Nurse Practitioner

## 2020-05-29 ENCOUNTER — Other Ambulatory Visit: Payer: Self-pay

## 2020-06-08 ENCOUNTER — Other Ambulatory Visit: Payer: Self-pay | Admitting: Pharmacist

## 2020-06-08 DIAGNOSIS — I10 Essential (primary) hypertension: Secondary | ICD-10-CM

## 2020-06-08 MED ORDER — METOPROLOL SUCCINATE ER 50 MG PO TB24: 50.0000 mg | ORAL_TABLET | Freq: Every day | ORAL | 0 refills | Status: DC

## 2020-06-08 MED ORDER — LISINOPRIL-HYDROCHLOROTHIAZIDE 20-12.5 MG PO TABS: 1.0000 | ORAL_TABLET | Freq: Every day | ORAL | 0 refills | Status: DC

## 2020-06-08 MED ORDER — AMLODIPINE BESYLATE 10 MG PO TABS: 10.0000 mg | ORAL_TABLET | Freq: Every day | ORAL | 0 refills | Status: DC

## 2020-06-08 MED FILL — AMLODIPINE BESYLATE 10 MG T: 10 | 30 days supply | Qty: 30 | Fill #0

## 2020-06-08 MED FILL — LISINOPRIL-HCTZ 20-12.5 MG: 20-12.5 | 30 days supply | Qty: 30 | Fill #0

## 2020-06-08 MED FILL — ?METOPROLOL SUCC ER 50M TAB: 50 | 30 days supply | Qty: 30 | Fill #0

## 2020-06-13 ENCOUNTER — Ambulatory Visit: Payer: Self-pay | Admitting: Internal Medicine

## 2020-06-19 ENCOUNTER — Other Ambulatory Visit: Payer: Self-pay

## 2020-06-19 DIAGNOSIS — Z1231 Encounter for screening mammogram for malignant neoplasm of breast: Secondary | ICD-10-CM

## 2020-06-21 ENCOUNTER — Ambulatory Visit: Payer: Self-pay

## 2020-07-03 ENCOUNTER — Ambulatory Visit: Payer: Self-pay

## 2020-07-09 ENCOUNTER — Telehealth: Payer: Self-pay | Admitting: Family Medicine

## 2020-07-09 ENCOUNTER — Other Ambulatory Visit: Payer: Self-pay | Admitting: Family Medicine

## 2020-07-09 DIAGNOSIS — I10 Essential (primary) hypertension: Secondary | ICD-10-CM

## 2020-07-09 MED ORDER — LISINOPRIL-HYDROCHLOROTHIAZIDE 20-12.5 MG PO TABS: 1.0000 | ORAL_TABLET | Freq: Every day | ORAL | 1 refills | Status: DC

## 2020-07-09 MED ORDER — METOPROLOL SUCCINATE ER 50 MG PO TB24: 50.0000 mg | ORAL_TABLET | Freq: Every day | ORAL | 1 refills | Status: DC

## 2020-07-09 MED ORDER — AMLODIPINE BESYLATE 10 MG PO TABS: 10.0000 mg | ORAL_TABLET | Freq: Every day | ORAL | 1 refills | Status: DC

## 2020-07-09 NOTE — Telephone Encounter (Signed)
Rxs sent

## 2020-07-09 NOTE — Telephone Encounter (Signed)
Medication Refill - Medication: amlodipine, lisinopril, metoprolol    Has the patient contacted their pharmacy? Yes.   Pt states that she called on Wednesday and was not able to have the message relayed for. Please advise.  (Agent: If no, request that the patient contact the pharmacy for the refill.) (Agent: If yes, when and what did the pharmacy advise?)  Preferred Pharmacy (with phone number or street name):  [facility], [address]  [place] [address]  Phone: [phone] Fax: [phone]  Hours: Not open 24 hours     Agent: Please be advised that RX refills may take up to 3 business days. We ask that you follow-up with your pharmacy.

## 2020-07-10 MED FILL — LISINOPRIL-HCTZ 20-12.5 MG: 20-12.5 | 30 days supply | Qty: 30 | Fill #0

## 2020-07-10 MED FILL — METOPROLOL SUCCINATE ER 50: 50 | 30 days supply | Qty: 30 | Fill #0

## 2020-07-10 MED FILL — AMLODIPINE BESYLATE 10 MG T: 10 | 30 days supply | Qty: 30 | Fill #0

## 2020-08-09 MED FILL — LISINOPRIL-HCTZ 20-12.5 MG: 20-12.5 | 30 days supply | Qty: 30 | Fill #1

## 2020-08-09 MED FILL — AMLODIPINE BESYLATE 10 MG T: 10 | 30 days supply | Qty: 30 | Fill #1

## 2020-08-09 MED FILL — METOPROLOL SUCCINATE ER 50: 50 | 30 days supply | Qty: 30 | Fill #1

## 2020-08-15 IMAGING — CT CT HEAD W/O CM
4 series · 15 of 47 positions shown, 17 images · non-contrast
Comparison: CT head 09/17/2008.

CLINICAL DATA: Throbbing headache for 1 month. Worse headache of
life. On multiple hypertensive medications.

EXAM:
CT HEAD WITHOUT CONTRAST
TECHNIQUE: Contiguous axial images were obtained from the base of the skull
through the vertex without intravenous contrast.

[Series 3: head wo · axial · 0.42mm/px · z∈[-134,-14]mm · 7 of 32 slices shown, 9 images]
[im 4/32  brain]
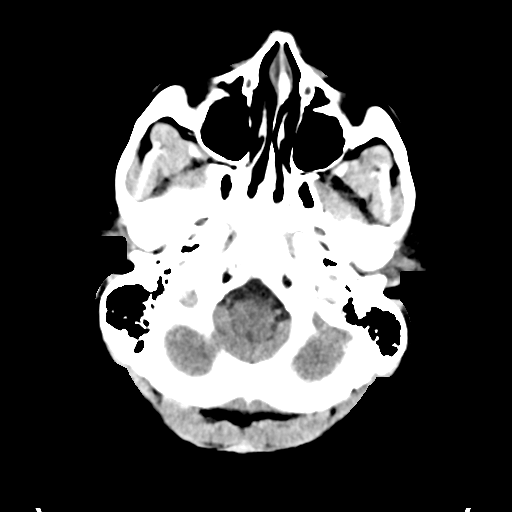
[im 4/32  bone]
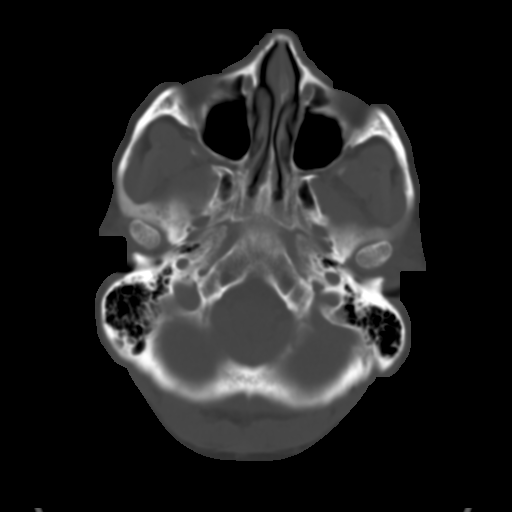
[im 8/32  brain]
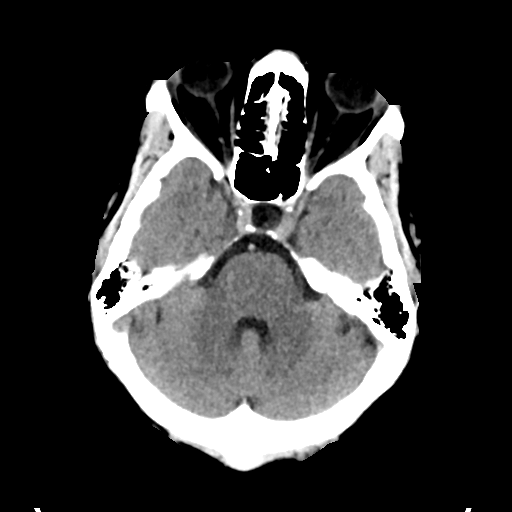
[im 12/32  brain]
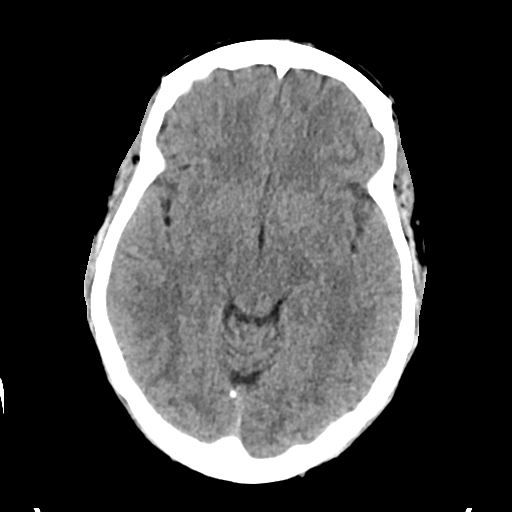
[im 16/32  brain]
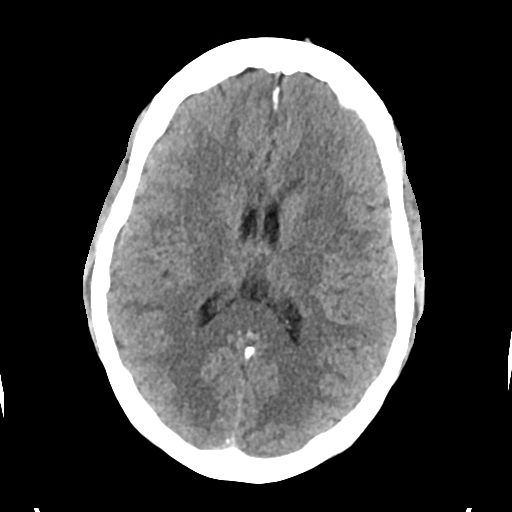
[im 20/32  brain]
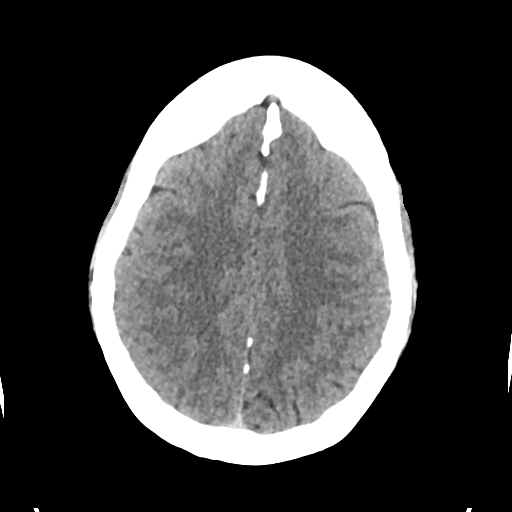
[im 20/32  bone]
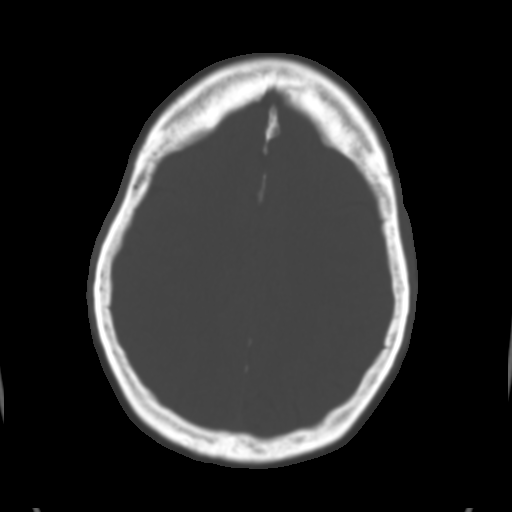
[im 24/32  brain]
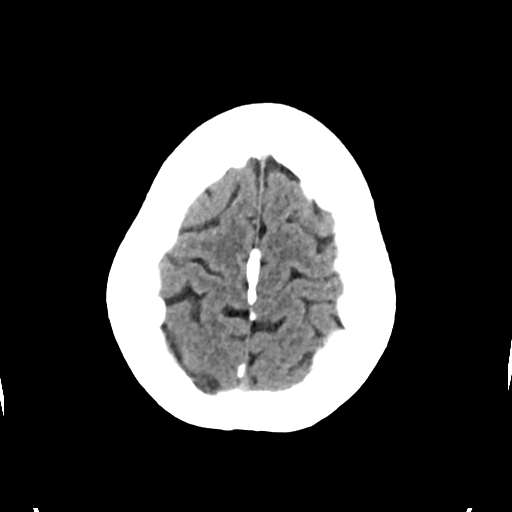
[im 28/32  brain]
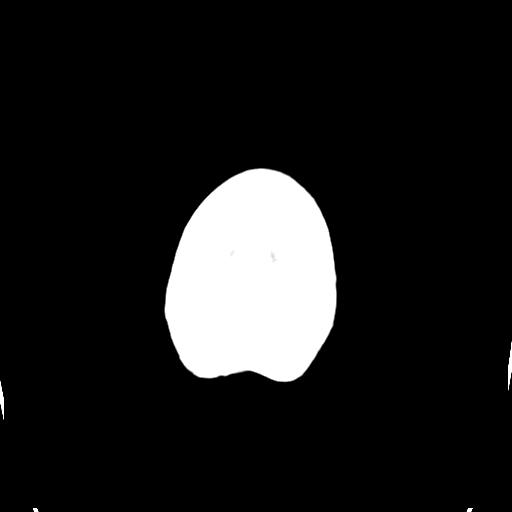

[Series 4: head bone · axial · 0.42mm/px · z∈[-136,-120]mm · 2 of 79 slices shown]
[im 8/79  bone]
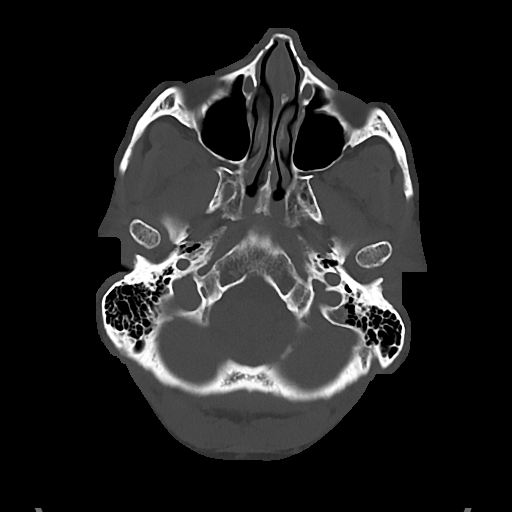
[im 16/79  bone]
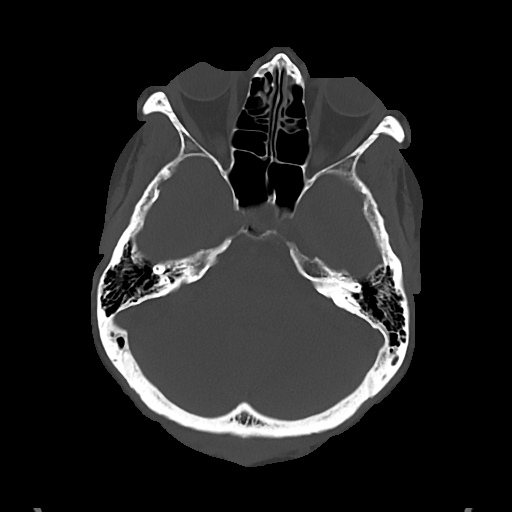

[Series 5: cor soft · coronal · 0.30mm/px · 3 of 71 slices shown]
[im 24/71  brain]
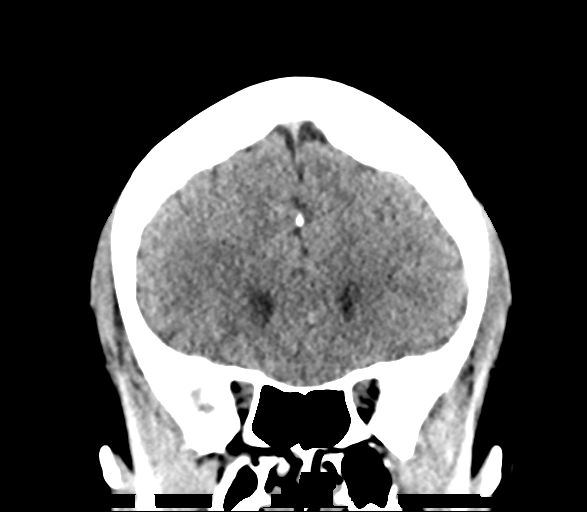
[im 32/71  brain]
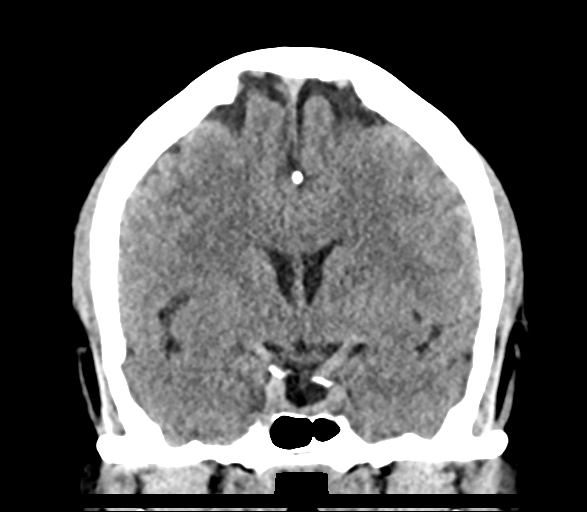
[im 39/71  brain]
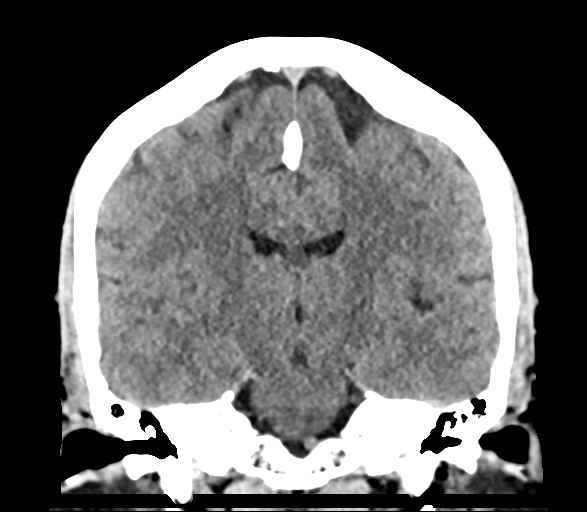

[Series 6: sag soft · sagittal · 0.30mm/px · 3 of 53 slices shown]
[im 18/53  brain]
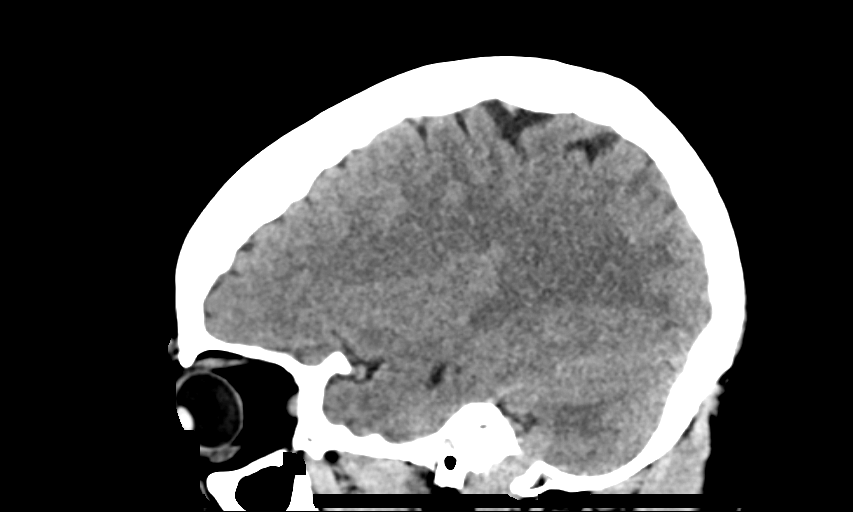
[im 27/53  brain]
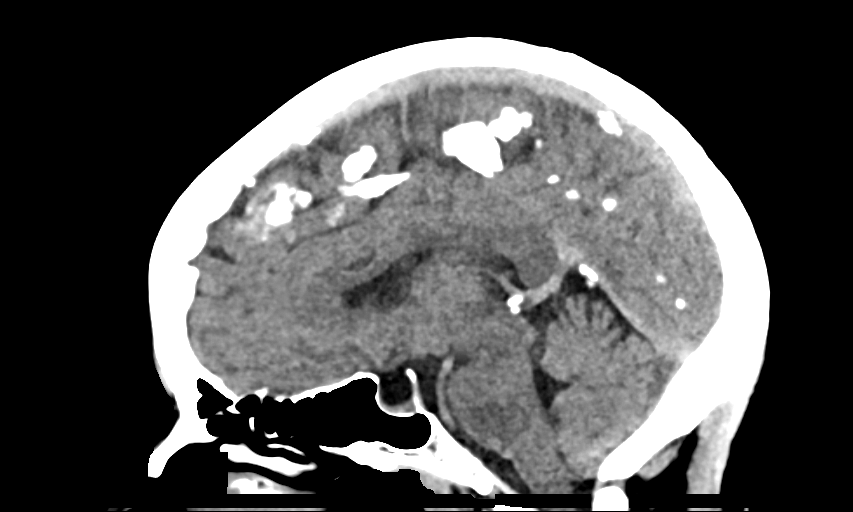
[im 35/53  brain]
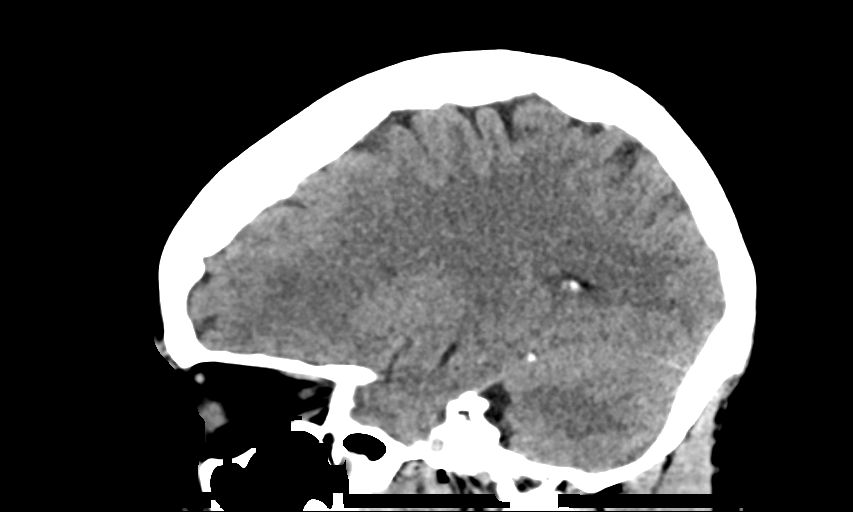

[15 of 47 positions shown; findings below may reference images not displayed]

FINDINGS: Brain: There is no evidence of acute intracranial hemorrhage, mass
lesion, brain edema or extra-axial fluid collection. The ventricles
and subarachnoid spaces are appropriately sized for age. There is no
CT evidence of acute cortical infarction. Dural calcifications and
empty sella turcica are noted, stable.

Vascular:  No hyperdense vessel identified.

Skull: Negative for fracture or focal lesion.

Sinuses/Orbits: The visualized paranasal sinuses and mastoid air
cells are clear. No orbital abnormalities are seen.

Other: None.
IMPRESSION: Stable examination without acute findings.

## 2020-08-23 ENCOUNTER — Ambulatory Visit: Payer: Self-pay

## 2020-08-25 ENCOUNTER — Emergency Department (HOSPITAL_COMMUNITY)
Admission: EM | Admit: 2020-08-25 | Discharge: 2020-08-25 | Disposition: A | Discharge status: Home / Self Care | Payer: Self-pay | Attending: Emergency Medicine | Admitting: Emergency Medicine

## 2020-08-25 ENCOUNTER — Other Ambulatory Visit: Payer: Self-pay

## 2020-08-25 DIAGNOSIS — R590 Localized enlarged lymph nodes: Secondary | ICD-10-CM | POA: Insufficient documentation

## 2020-08-25 DIAGNOSIS — F419 Anxiety disorder, unspecified: Secondary | ICD-10-CM | POA: Insufficient documentation

## 2020-08-25 DIAGNOSIS — F17210 Nicotine dependence, cigarettes, uncomplicated: Secondary | ICD-10-CM | POA: Insufficient documentation

## 2020-08-25 DIAGNOSIS — K029 Dental caries, unspecified: Secondary | ICD-10-CM | POA: Insufficient documentation

## 2020-08-25 DIAGNOSIS — Z79899 Other long term (current) drug therapy: Secondary | ICD-10-CM | POA: Insufficient documentation

## 2020-08-25 DIAGNOSIS — K0889 Other specified disorders of teeth and supporting structures: Secondary | ICD-10-CM | POA: Insufficient documentation

## 2020-08-25 DIAGNOSIS — I10 Essential (primary) hypertension: Secondary | ICD-10-CM | POA: Insufficient documentation

## 2020-08-25 MED ORDER — AMOXICILLIN-POT CLAVULANATE 875-125 MG PO TABS
1.0000 | ORAL_TABLET | Freq: Once | ORAL | Status: AC
  Administered 2020-08-25: 1 via ORAL
  Filled 2020-08-25: qty 1

## 2020-08-25 MED ORDER — HYDROCODONE-ACETAMINOPHEN 5-325 MG PO TABS
1.0000 | ORAL_TABLET | Freq: Once | ORAL | Status: AC
  Administered 2020-08-25: 1 via ORAL
  Filled 2020-08-25: qty 1

## 2020-08-25 MED ORDER — KETOROLAC TROMETHAMINE 30 MG/ML IJ SOLN
30.0000 mg | Freq: Once | INTRAMUSCULAR | Status: AC
  Administered 2020-08-25: 30 mg via INTRAMUSCULAR
  Filled 2020-08-25: qty 1

## 2020-08-25 MED ORDER — KETOROLAC TROMETHAMINE 30 MG/ML IJ SOLN: 30.0000 mg | Freq: Once | INTRAMUSCULAR | Status: DC

## 2020-08-25 MED ORDER — AMOXICILLIN-POT CLAVULANATE 875-125 MG PO TABS: 1.0000 | ORAL_TABLET | Freq: Two times a day (BID) | ORAL | 0 refills | Status: AC

## 2020-08-25 NOTE — Discharge Instructions (Addendum)
You were evaluated today in the emergency department for your dental pain.  You are administered 2 doses of pain medication while in the ER.  Your vital signs and physical exam are reassuring.    You have infection from your broken teeth. You have been prescribed an antibiotic you  should take at home 2 times per day for the next 10 days.  You were given the first dose here in the ER.  You may continue take Tylenol and ibuprofen at home for your pain.  Please follow the instructions on the bottle, so that you do not accidentally take more than the recommended daily dose.  You have also been given resources for dentists in the area that may help with your dental problems.  Please call them first thing Monday morning to schedule follow-up.    Return to the emergency room if develop any difficulty swallowing, worsening pain in your neck, difficulty breathing, chest pain, palpitation, nausea or vomiting that does not stop, or any other new severe symptoms

## 2020-08-25 NOTE — ED Triage Notes (Signed)
Reports dental pain to right upper jaw, also reports swelling to right side of neck/head. States she is just here to receive antibiotic.

## 2020-08-25 NOTE — ED Provider Notes (Signed)
[facility] DEPT Provider Note   CSN: [id number] Arrival date & time: 08/25/20  1015     History Chief Complaint  Patient presents with  . Dental Pain    Jennifer [name] is a 60 y.o. female who presents with concern for 3 days of dental pain, with 2 episodes of NBNB emesis this morning.  Patient with history of dental problems, has had multiple episodes of dental infection in the past.  States he has been taking Tylenol and ibuprofen at home with some relief, but pain has become unbearable.  She states she is concerned about being able to afford antibiotic treatment.  She denies fevers or chills at home, denies chest pain, palpitations, diarrhea, abdominal pain,congestion, headache.  She endorses pain in her anterior and right neck, swelling to her right cheek.  Patient is tearful, anxious in the hospital room.  I personally reviewed this patient's medical respiratory history of hypertension, bipolar disorder, GERD, anxiety, polysubstance abuse.  HPI     Past Medical History:  Diagnosis Date  . bipolar   . Bipolar 1 disorder ([place])   . Depression   . Fibroids    now s/p hysterectomy  . Hypertension   . Left ventricular hypertrophy   . Polysubstance abuse ([place])    IV drug [place], cocaine on occassion, smoking and alcoholism, has been going to AA, not completely sober    Patient Active Problem List   Diagnosis Date Noted  . Current smoker 08/12/2016  . Homeless single person 08/12/2016  . Skin lesion of left leg 03/29/2015  . Trigger middle finger of right hand 01/11/2015  . Discoloration of eye 01/11/2015  . Bradycardia 01/11/2015  . Skin lesion of scalp 07/17/2014  . Leg swelling 05/20/2013  . Vasomotor rhinitis 08/17/2012  . Right leg pain 07/26/2012  . Anxiety 02/26/2012  . GERD (gastroesophageal reflux disease) 08/08/2011  . Menopausal symptoms 07/23/2011  . Mastalgia 07/23/2011  . Bipolar disorder, current episode manic without  psychotic features, moderate ([place]) 12/05/2010  . 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 SEBACEOUS CYST, SCALP 05/06/2010  . SYNOVIAL CYST 05/03/2010  . Abdominal pain 12/06/2009  . DRUG ABUSE, HX OF 12/06/2009  . Lipoma of shoulder 02/06/2009  . FIBROIDS, UTERUS 02/06/2009  . DRUG ABUSE 02/06/2009  . Essential hypertension, benign 02/06/2009  . PAIN IN JOINT, SHOULDER REGION 02/06/2009  . Insomnia 02/06/2009  . PELVIC MASS 02/06/2009    Past Surgical History:  Procedure Laterality Date  . ABDOMINAL HYSTERECTOMY     2003  . BILATERAL SALPINGOOPHORECTOMY     01/2010  . DILATION AND CURETTAGE OF UTERUS    . EXPLORATORY LAPAROTOMY     complex pelvic mass 2011  . TONSILLECTOMY       OB History    Gravida  5   Para  0   Term  0   Preterm  0   AB  5   Living  0     SAB  2   IAB  1   Ectopic  0   Multiple  0   Live Births              Family History  Problem Relation Age of Onset  . Hypertension Mother   . Hypertension Sister   . Diabetes Father     Social History   Tobacco Use  . Smoking status: Current Every Day Smoker    Packs/day: 0.50    Years: 20.00    Pack years: 10.00  Types: Cigarettes  . Smokeless tobacco: Never Used  . Tobacco comment: Wants to quit.   Vaping Use  . Vaping Use: Never used  Substance Use Topics  . Alcohol use: Yes    Alcohol/week: 0.0 standard drinks  . Drug use: Not Currently    Types: Cocaine, Marijuana    Home Medications Prior to Admission medications   Medication Sig Start Date End Date Taking? Authorizing Provider  amoxicillin-clavulanate (AUGMENTIN) 875-125 MG tablet Take 1 tablet by mouth every 12 (twelve) hours for 10 days. 08/25/20 09/04/20 Yes Elex Mainwaring, Eugene Garnet R, PA-C  amLODipine (NORVASC) 10 MG tablet Take 1 tablet (10 mg total) by mouth daily. 07/09/20 09/07/20  Charlott Rakes, MD  chlorhexidine (PERIDEX) 0.12 % solution Use as directed 15 mLs in the mouth or throat 2 (two) times daily. 05/16/20    Gildardo Pounds, NP  gabapentin (NEURONTIN) 300 MG capsule Take 1 capsule (300 mg total) by mouth 2 (two) times daily. Patient not taking: Reported on 12/06/2019 07/18/19   Charlott Rakes, MD  lisinopril-hydrochlorothiazide (ZESTORETIC) 20-12.5 MG tablet Take 1 tablet by mouth daily. 07/09/20   Charlott Rakes, MD  metoprolol succinate (TOPROL-XL) 50 MG 24 hr tablet Take 1 tablet (50 mg total) by mouth daily. 07/09/20   Charlott Rakes, MD  omeprazole (PRILOSEC) 40 MG capsule Take 1 capsule (40 mg total) by mouth daily. 07/18/19   Charlott Rakes, MD    Allergies    Patient has no known allergies.  Review of Systems   Review of Systems  Constitutional: Negative.  Negative for chills and fever.  HENT: Positive for dental problem and ear pain.   Respiratory: Negative.   Cardiovascular: Negative.   Gastrointestinal: Positive for nausea and vomiting. Negative for abdominal pain and diarrhea.  Genitourinary: Negative.   Musculoskeletal: Negative.   Skin: Negative.   Allergic/Immunologic: Negative.   Neurological: Negative.   Hematological: Negative.     Physical Exam Updated Vital Signs BP (!) 147/95   Pulse 64   Temp 98.4 F (36.9 C) (Oral)   Resp 16   LMP 12/04/2009   SpO2 98%   Physical Exam Vitals and nursing note reviewed.  HENT:     Head: Normocephalic and atraumatic.      Right Ear: Tympanic membrane, ear canal and external ear normal.     Nose: Nose normal.     Mouth/Throat:     Mouth: Mucous membranes are moist.     Dentition: Abnormal dentition. Dental tenderness, gingival swelling and dental caries present.     Pharynx: Oropharynx is clear. Uvula midline. No oropharyngeal exudate or posterior oropharyngeal erythema.      Comments: No Sublingual, submental TTP, mild anterior neck TTP without crepitus or edema Eyes:     General: Lids are normal. Vision grossly intact.        Right eye: No discharge.        Left eye: No discharge.     Conjunctiva/sclera:  Conjunctivae normal.     Pupils: Pupils are equal, round, and reactive to light.  Neck:     Trachea: Trachea and phonation normal.   Cardiovascular:     Rate and Rhythm: Normal rate and regular rhythm.     Pulses: Normal pulses.          Radial pulses are 2+ on the right side and 2+ on the left side.     Heart sounds: Normal heart sounds. No murmur heard.   Pulmonary:     Effort: Pulmonary effort is normal.  No respiratory distress.     Breath sounds: Normal breath sounds. No wheezing or rales.  Chest:     Chest wall: No deformity, swelling, tenderness or crepitus.  Abdominal:     General: Bowel sounds are normal. There is no distension.     Palpations: Abdomen is soft.     Tenderness: There is no abdominal tenderness. There is no guarding or rebound.  Musculoskeletal:        General: No deformity.     Cervical back: Full passive range of motion without pain, normal range of motion and neck supple. Tenderness present. No rigidity or crepitus. No pain with movement or spinous process tenderness.     Right lower leg: No edema.     Left lower leg: No edema.  Lymphadenopathy:     Cervical: Cervical adenopathy present.     Right cervical: Superficial cervical adenopathy present.     Left cervical: Superficial cervical adenopathy present.  Skin:    General: Skin is warm and dry.     Capillary Refill: Capillary refill takes less than 2 seconds.  Neurological:     General: No focal deficit present.     Mental Status: She is alert and oriented to person, place, and time.     Sensory: Sensation is intact.     Motor: Motor function is intact.     Gait: Gait is intact.  Psychiatric:        Mood and Affect: Mood is anxious. Affect is tearful.     ED Results / Procedures / Treatments   Labs (all labs ordered are listed, but only abnormal results are displayed) Labs Reviewed - No data to display  EKG None  Radiology No results found.  Procedures Procedures (including critical  care time)  Medications Ordered in ED Medications  HYDROcodone-acetaminophen (NORCO/VICODIN) 5-325 MG per tablet 1 tablet (1 tablet Oral Given 08/25/20 1207)  ketorolac (TORADOL) 30 MG/ML injection 30 mg (30 mg Intramuscular Given 08/25/20 1208)  amoxicillin-clavulanate (AUGMENTIN) 875-125 MG per tablet 1 tablet (1 tablet Oral Given 08/25/20 1242)    ED Course  I have reviewed the triage vital signs and the nursing notes.  Pertinent labs & imaging results that were available during my care of the patient were reviewed by me and considered in my medical decision making (see chart for details).    MDM Rules/Calculators/A&P                         60 year old female who presents with concern for 3 days of right upper worsening right-sided lower dental pain, without fever, with 2 episodes of NBNB nausea, vomiting.  Patient hypertensive on intake, vital signs otherwise normal.  Physical exam significant for multiple fractured and decaying teeth, worse in the right mandible, associated buccal tenderness to palpation and edema.  No signs of Ludwig's angina or oral abscess.  Anterior neck without edema, crepitus, or tracheal deviation.  Cardiopulmonary exam is reassuring.  Analgesia offered while in the emergency department, first dose of oral antibiotics offered.  No indication for imaging at this time given reassuring physical exam and vital signs.  Will discharge with course of antibiotic therapy, dental resources.  No further work-up warranted in the emergency department at this time.  Marsi voiced understanding of her medical evaluation and treatment plan. Each of her questions were answered to her expressed satisfaction.  Return precautions given.  Patient is stable and appropriate for discharge at this time.  This chart  was dictated using voice recognition software, Dragon. Despite the best efforts of this provider to proofread and correct errors, errors may still occur which can change  documentation meaning.   Final Clinical Impression(s) / ED Diagnoses Final diagnoses:  Pain, dental    Rx / DC Orders ED Discharge Orders         Ordered    amoxicillin-clavulanate (AUGMENTIN) 875-125 MG tablet  Every 12 hours        08/25/20 [address] Gypsy Balsam, PA-C 08/25/20 1816    Lucrezia Starch, MD 08/26/20 [phone]

## 2020-09-04 ENCOUNTER — Other Ambulatory Visit: Payer: Self-pay | Admitting: Family Medicine

## 2020-09-04 DIAGNOSIS — I10 Essential (primary) hypertension: Secondary | ICD-10-CM

## 2020-09-04 MED FILL — LISINOPRIL-HCTZ 20-12.5 MG: 20-12.5 | 30 days supply | Qty: 30 | Fill #0

## 2020-09-04 MED FILL — METOPROLOL SUCCINATE ER 50: 50 | 30 days supply | Qty: 30 | Fill #0

## 2020-09-04 MED FILL — AMLODIPINE BESYLATE 10 MG T: 10 | 30 days supply | Qty: 30 | Fill #0

## 2020-10-05 MED FILL — LISINOPRIL-HCTZ 20-12.5 MG: 20-12.5 | 30 days supply | Qty: 30 | Fill #1

## 2020-10-05 MED FILL — METOPROLOL SUCCINATE ER 50: 50 | 30 days supply | Qty: 30 | Fill #1

## 2020-10-05 MED FILL — AMLODIPINE BESYLATE 10 MG T: 10 | 30 days supply | Qty: 30 | Fill #1

## 2020-11-02 ENCOUNTER — Other Ambulatory Visit: Payer: Self-pay | Admitting: Family Medicine

## 2020-11-02 ENCOUNTER — Other Ambulatory Visit: Payer: Self-pay | Admitting: Pharmacist

## 2020-11-02 DIAGNOSIS — I10 Essential (primary) hypertension: Secondary | ICD-10-CM

## 2020-11-02 MED FILL — METOPROLOL SUCCINATE ER 50: 50 | 30 days supply | Qty: 30 | Fill #0

## 2020-11-02 MED FILL — LISINOPRIL-HCTZ 20-12.5 MG: 20-12.5 | 30 days supply | Qty: 30 | Fill #0

## 2020-11-02 MED FILL — AMLODIPINE BESYLATE 10 MG T: 10 | 30 days supply | Qty: 30 | Fill #2

## 2020-11-02 NOTE — Progress Notes (Signed)
Pt is requesting metoprolol and lisinopril-HCTZ. Last had labs 03/28/2019. Approval will have to come from PCP.

## 2020-11-02 NOTE — Telephone Encounter (Signed)
Requested Prescriptions  Pending Prescriptions Disp Refills  . metoprolol succinate (TOPROL-XL) 50 MG 24 hr tablet [Pharmacy Med Name: METOPROLOL SUCCINATE ER 50 50 Tablet] 30 tablet 1    Sig: TAKE 1 TABLET (50 MG TOTAL) BY MOUTH DAILY.     Cardiovascular:  Beta Blockers Failed - 11/02/2020  1:44 PM      Failed - Last BP in normal range    BP Readings from Last 1 Encounters:  08/25/20 (!) 147/95         Passed - Last Heart Rate in normal range    Pulse Readings from Last 1 Encounters:  08/25/20 64         Passed - Valid encounter within last 6 months    Recent Outpatient Visits          5 months ago Bilateral hand pain   [place], Vernia Buff, NP   8 months ago Pain, dental   [place], [place], NP   11 months ago Trichomonas vaginitis   [place], Enobong, MD   1 year ago Primary osteoarthritis of both hands   [place], Enobong, MD   1 year ago Carpal tunnel syndrome of right wrist   [place], [place], MD             . lisinopril-hydrochlorothiazide (ZESTORETIC) 20-12.5 MG tablet [Pharmacy Med Name: LISINOPRIL-HCTZ 20-12.5 MG 20-12.5 Tablet] 30 tablet 1    Sig: TAKE 1 TABLET BY MOUTH DAILY.     Cardiovascular:  ACEI + Diuretic Combos Failed - 11/02/2020  1:44 PM      Failed - Na in normal range and within 180 days    Sodium  Date Value Ref Range Status  03/28/2019 139 135 - 145 mmol/L Final         Failed - K in normal range and within 180 days    Potassium  Date Value Ref Range Status  03/28/2019 4.4 3.5 - 5.1 mmol/L Final         Failed - Cr in normal range and within 180 days    Creat  Date Value Ref Range Status  09/30/2016 1.15 (H) 0.50 - 1.05 mg/dL Final    Comment:      For patients > or = 58 years of age: The upper reference limit for Creatinine is  approximately 13% higher for people identified as African-American.      Creatinine, Ser  Date Value Ref Range Status  03/28/2019 0.86 0.44 - 1.00 mg/dL Final   Creatinine,U  Date Value Ref Range Status  12/15/2009  mg/dL Final   87.6 (NOTE)  Cutoff Values for Urine Drug Screen:        Drug Class           Cutoff (ng/mL)        Amphetamines            1000        Barbiturates             200        Cocaine Metabolites      300        Benzodiazepines          200        Methadone                 300  Opiates                 2000        Phencyclidine             25        Propoxyphene             300        Marijuana Metabolites     50  For medical purposes only.   Creatinine, Urine  Date Value Ref Range Status  08/08/2011 223.4 20 OR > MG/DL Final         Failed - Ca in normal range and within 180 days    Calcium  Date Value Ref Range Status  03/28/2019 9.5 8.9 - 10.3 mg/dL Final   Calcium, Ion  Date Value Ref Range Status  02/11/2016 1.16 1.13 - 1.30 mmol/L Final         Failed - Last BP in normal range    BP Readings from Last 1 Encounters:  08/25/20 (!) 147/95         Passed - Patient is not pregnant      Passed - Valid encounter within last 6 months    Recent Outpatient Visits          5 months ago Bilateral hand pain   [place], Vernia Buff, NP   8 months ago Pain, dental   [place], [place], NP   11 months ago Trichomonas vaginitis   [place], Enobong, MD   1 year ago Primary osteoarthritis of both hands   [place], En[place], MD   1 year ago Carpal tunnel syndrome of right wrist   [facility] Charlott Rakes, MD

## 2020-11-04 ENCOUNTER — Other Ambulatory Visit: Payer: Self-pay | Admitting: Family Medicine

## 2020-11-04 MED ORDER — METOPROLOL SUCCINATE ER 50 MG PO TB24: 50.0000 mg | ORAL_TABLET | Freq: Every day | ORAL | 0 refills | Status: DC

## 2020-11-04 MED ORDER — LISINOPRIL-HYDROCHLOROTHIAZIDE 20-12.5 MG PO TABS: 1.0000 | ORAL_TABLET | Freq: Every day | ORAL | 0 refills | Status: DC

## 2020-11-04 NOTE — Progress Notes (Signed)
I have ordered labs which she needs to have done when she comes to pick up her medication. Courtesy refill done and she needs to schedule an appointment. Thanks.

## 2020-11-08 ENCOUNTER — Ambulatory Visit: Payer: Self-pay

## 2020-11-10 ENCOUNTER — Other Ambulatory Visit: Payer: Self-pay

## 2020-11-13 ENCOUNTER — Other Ambulatory Visit: Payer: Self-pay

## 2020-11-27 ENCOUNTER — Other Ambulatory Visit: Payer: Self-pay | Admitting: Nurse Practitioner

## 2020-11-27 ENCOUNTER — Other Ambulatory Visit: Payer: Self-pay

## 2020-11-27 DIAGNOSIS — K029 Dental caries, unspecified: Secondary | ICD-10-CM

## 2020-11-27 MED FILL — Amlodipine Besylate Tab 10 MG (Base Equivalent): ORAL | 30 days supply | Qty: 30 | Fill #0 | Status: AC

## 2020-11-27 MED FILL — Metoprolol Succinate Tab ER 24HR 50 MG (Tartrate Equiv): ORAL | 30 days supply | Qty: 30 | Fill #0 | Status: AC

## 2020-11-27 MED FILL — Lisinopril & Hydrochlorothiazide Tab 20-12.5 MG: ORAL | 30 days supply | Qty: 30 | Fill #0 | Status: AC

## 2020-11-27 NOTE — Telephone Encounter (Signed)
Requested medication (s) are due for refill today: no  Requested medication (s) are on the active medication list: yes   Last refill:  05/16/2020  Future visit scheduled: no  Notes to clinic:   Medication not assigned to a protocol, review manually  Requested Prescriptions  Pending Prescriptions Disp Refills   penicillin v potassium (VEETID) 500 MG tablet 28 tablet 0    Sig: TAKE 1 TABLET (500 MG TOTAL) BY MOUTH 4 (FOUR) TIMES DAILY FOR 7 DAYS.      Off-Protocol Failed - 11/27/2020 12:58 PM      Failed - Medication not assigned to a protocol, review manually.      Passed - Valid encounter within last 12 months    Recent Outpatient Visits           6 months ago Bilateral hand pain   [place], Vernia Buff, NP   8 months ago Pain, dental   [place], [place], NP   11 months ago Trichomonas vaginitis   [place], En[place], MD   1 year ago Primary osteoarthritis of both hands   [place], En[place], MD   1 year ago Carpal tunnel syndrome of right wrist   [facility] Charlott Rakes, MD

## 2020-11-28 ENCOUNTER — Other Ambulatory Visit: Payer: Self-pay

## 2020-11-28 ENCOUNTER — Emergency Department (HOSPITAL_COMMUNITY)
Admission: EM | Admit: 2020-11-28 | Discharge: 2020-11-28 | Disposition: A | Discharge status: Home / Self Care | Payer: Self-pay | Attending: Emergency Medicine | Admitting: Emergency Medicine

## 2020-11-28 DIAGNOSIS — I10 Essential (primary) hypertension: Secondary | ICD-10-CM | POA: Insufficient documentation

## 2020-11-28 DIAGNOSIS — R112 Nausea with vomiting, unspecified: Secondary | ICD-10-CM | POA: Insufficient documentation

## 2020-11-28 DIAGNOSIS — Z79899 Other long term (current) drug therapy: Secondary | ICD-10-CM | POA: Insufficient documentation

## 2020-11-28 DIAGNOSIS — R040 Epistaxis: Secondary | ICD-10-CM | POA: Insufficient documentation

## 2020-11-28 DIAGNOSIS — F17210 Nicotine dependence, cigarettes, uncomplicated: Secondary | ICD-10-CM | POA: Insufficient documentation

## 2020-11-28 DIAGNOSIS — K047 Periapical abscess without sinus: Secondary | ICD-10-CM | POA: Insufficient documentation

## 2020-11-28 MED ORDER — AMOXICILLIN 500 MG PO CAPS: 500.0000 mg | ORAL_CAPSULE | Freq: Three times a day (TID) | ORAL | 0 refills | Status: DC

## 2020-11-28 MED ORDER — OXYCODONE-ACETAMINOPHEN 5-325 MG PO TABS: 1.0000 | ORAL_TABLET | Freq: Four times a day (QID) | ORAL | 0 refills | Status: DC | PRN

## 2020-11-28 MED ORDER — ONDANSETRON HCL 4 MG PO TABS: 4.0000 mg | ORAL_TABLET | Freq: Three times a day (TID) | ORAL | 0 refills | Status: DC | PRN

## 2020-11-28 MED ORDER — ONDANSETRON 4 MG PO TBDP
4.0000 mg | ORAL_TABLET | Freq: Once | ORAL | Status: AC
  Administered 2020-11-28: 4 mg via ORAL
  Filled 2020-11-28: qty 1

## 2020-11-28 MED ORDER — OXYCODONE-ACETAMINOPHEN 5-325 MG PO TABS
1.0000 | ORAL_TABLET | Freq: Once | ORAL | Status: AC
Start: 2020-11-28 — End: 2020-11-28
  Administered 2020-11-28: 1 via ORAL
  Filled 2020-11-28: qty 1

## 2020-11-28 NOTE — ED Triage Notes (Signed)
Patient complains of nosebleeds x3 days, oral swelling and not being able to keep food down, states she needs to go to the dentist but she cannot afford it. Also endorses L ear pain, states she feels like there is something in her ear. Eardrum intact.

## 2020-11-28 NOTE — ED Provider Notes (Signed)
[facility] DEPT Provider Note   CSN: [id number] Arrival date & time: 11/28/20  1025     History Chief Complaint  Patient presents with  . Ear Pain  . Nausea  . Oral Swelling    Jennifer [name] is a 60 y.o. female.  Patient presents with several complaints.  She initially complained of right-sided ear discomfort, she states she feels like a warm is in her ear.  The symptoms of been ongoing for the past 4 to 5 days, described as a wiggling sensation.  She also noticed some bleeding from her nose in the last 3 days.  She states that is intermittent lasting 15 minutes at a time, subsequently has stopped.  Denies any nasal pain or trauma.  She also states that she has had right-sided lower toothache for several weeks now as well.  Denies fevers or cough.  Has had nausea and vomiting intermittently for the past day and a half.  Nonbloody vomitus described.        Past Medical History:  Diagnosis Date  . bipolar   . Bipolar 1 disorder ([place])   . Depression   . Fibroids    now s/p hysterectomy  . Hypertension   . Left ventricular hypertrophy   . Polysubstance abuse ([place])    IV drug [place], cocaine on occassion, smoking and alcoholism, has been going to AA, not completely sober    Patient Active Problem List   Diagnosis Date Noted  . Current smoker 08/12/2016  . Homeless single person 08/12/2016  . Skin lesion of left leg 03/29/2015  . Trigger middle finger of right hand 01/11/2015  . Discoloration of eye 01/11/2015  . Bradycardia 01/11/2015  . Skin lesion of scalp 07/17/2014  . Leg swelling 05/20/2013  . Vasomotor rhinitis 08/17/2012  . Right leg pain 07/26/2012  . Anxiety 02/26/2012  . GERD (gastroesophageal reflux disease) 08/08/2011  . Menopausal symptoms 07/23/2011  . Mastalgia 07/23/2011  . Bipolar disorder, current episode manic without psychotic features, moderate ([place]) 12/05/2010  . 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 SEBACEOUS CYST,  SCALP 05/06/2010  . SYNOVIAL CYST 05/03/2010  . Abdominal pain 12/06/2009  . DRUG ABUSE, HX OF 12/06/2009  . Lipoma of shoulder 02/06/2009  . FIBROIDS, UTERUS 02/06/2009  . DRUG ABUSE 02/06/2009  . Essential hypertension, benign 02/06/2009  . PAIN IN JOINT, SHOULDER REGION 02/06/2009  . Insomnia 02/06/2009  . PELVIC MASS 02/06/2009    Past Surgical History:  Procedure Laterality Date  . ABDOMINAL HYSTERECTOMY     2003  . BILATERAL SALPINGOOPHORECTOMY     01/2010  . DILATION AND CURETTAGE OF UTERUS    . EXPLORATORY LAPAROTOMY     complex pelvic mass 2011  . TONSILLECTOMY       OB History    Gravida  5   Para  0   Term  0   Preterm  0   AB  5   Living  0     SAB  2   IAB  1   Ectopic  0   Multiple  0   Live Births              Family History  Problem Relation Age of Onset  . Hypertension Mother   . Hypertension Sister   . Diabetes Father     Social History   Tobacco Use  . Smoking status: Current Every Day Smoker    Packs/day: 0.50    Years: 20.00    Pack  years: 10.00    Types: Cigarettes  . Smokeless tobacco: Never Used  . Tobacco comment: Wants to quit.   Vaping Use  . Vaping Use: Never used  Substance Use Topics  . Alcohol use: Yes    Alcohol/week: 0.0 standard drinks  . Drug use: Not Currently    Types: Cocaine, Marijuana    Home Medications Prior to Admission medications   Medication Sig Start Date End Date Taking? Authorizing Provider  amoxicillin (AMOXIL) 500 MG capsule Take 1 capsule (500 mg total) by mouth 3 (three) times daily. 11/28/20  Yes Luna Fuse, MD  ondansetron (ZOFRAN) 4 MG tablet Take 1 tablet (4 mg total) by mouth every 8 (eight) hours as needed for nausea or vomiting. 11/28/20  Yes [place], Greggory Brandy, MD  oxyCODONE-acetaminophen (PERCOCET/ROXICET) 5-325 MG tablet Take 1 tablet by mouth every 6 (six) hours as needed for severe pain. 11/28/20  Yes Luna Fuse, MD  amLODipine (NORVASC) 10 MG tablet TAKE 1 TABLET (10  MG TOTAL) BY MOUTH DAILY. 07/09/20 07/09/21  Charlott Rakes, MD  amLODipine (NORVASC) 10 MG tablet TAKE 1 TABLET (10 MG TOTAL) BY MOUTH DAILY 01/23/20 01/22/21  Charlott Rakes, MD  chlorhexidine (PERIDEX) 0.12 % solution USE AS DIRECTED 15 MLS IN THE MOUTH OR THROAT 2 (TWO) TIMES DAILY. 05/16/20 05/16/21  Gildardo Pounds, NP  doxycycline (VIBRA-TABS) 100 MG tablet TAKE 1 TABLET (100 MG TOTAL) BY MOUTH 2 (TWO) TIMES DAILY FOR 7 DAYS. 05/21/20 05/21/21  Gildardo Pounds, NP  gabapentin (NEURONTIN) 300 MG capsule Take 1 capsule (300 mg total) by mouth 2 (two) times daily. Patient not taking: Reported on 12/06/2019 07/18/19   Charlott Rakes, MD  lisinopril-hydrochlorothiazide (ZESTORETIC) 20-12.5 MG tablet TAKE 1 TABLET BY MOUTH DAILY. 11/04/20 11/04/21  Charlott Rakes, MD  metoprolol succinate (TOPROL-XL) 50 MG 24 hr tablet TAKE 1 TABLET (50 MG TOTAL) BY MOUTH DAILY. 11/04/20 11/04/21  Charlott Rakes, MD  metroNIDAZOLE (FLAGYL) 500 MG tablet TAKE 1 TABLET (500 MG TOTAL) BY MOUTH 2 (TWO) TIMES DAILY FOR 7 DAYS. START AFTER COMPLETION OF DOXYCYCLINE 05/21/20 05/21/21  Gildardo Pounds, NP  omeprazole (PRILOSEC) 40 MG capsule Take 1 capsule (40 mg total) by mouth daily. 07/18/19   Charlott Rakes, MD  penicillin v potassium (VEETID) 500 MG tablet TAKE 1 TABLET (500 MG TOTAL) BY MOUTH 4 (FOUR) TIMES DAILY FOR 7 DAYS. 05/16/20 05/16/21  Gildardo Pounds, NP    Allergies    Patient has no known allergies.  Review of Systems   Review of Systems  Constitutional: Negative for fever.  HENT: Negative for ear pain.   Eyes: Negative for pain.  Respiratory: Negative for cough.   Cardiovascular: Negative for chest pain.  Gastrointestinal: Negative for abdominal pain.  Genitourinary: Negative for flank pain.  Musculoskeletal: Negative for back pain.  Skin: Negative for rash.  Neurological: Negative for headaches.    Physical Exam Updated Vital Signs BP (!) 155/112   Pulse 71   Temp 98.4 F (36.9 C)   Resp  18   Ht 5\' 4"  (1.626 m)   Wt 86.2 kg   LMP 12/04/2009   SpO2 99%   BMI 32.61 kg/m   Physical Exam Constitutional:      General: She is not in acute distress.    Appearance: Normal appearance.  HENT:     Head: Normocephalic.     Right Ear: Tympanic membrane, ear canal and external ear normal. There is no impacted cerumen.     Left  Ear: Tympanic membrane, ear canal and external ear normal. There is no impacted cerumen.     Nose: No congestion or rhinorrhea.     Comments: No bleeding from either nostril noted.    Mouth/Throat:     Mouth: Mucous membranes are dry.     Pharynx: Oropharynx is clear. No posterior oropharyngeal erythema.  Eyes:     Extraocular Movements: Extraocular movements intact.  Neck:     Comments: Mild right-sided cervical adenopathy noted. Cardiovascular:     Rate and Rhythm: Normal rate.  Pulmonary:     Effort: Pulmonary effort is normal.  Musculoskeletal:        General: Normal range of motion.     Cervical back: Normal range of motion.  Neurological:     General: No focal deficit present.     Mental Status: She is alert. Mental status is at baseline.     ED Results / Procedures / Treatments   Labs (all labs ordered are listed, but only abnormal results are displayed) Labs Reviewed - No data to display  EKG None  Radiology No results found.  Procedures Procedures   Medications Ordered in ED Medications  oxyCODONE-acetaminophen (PERCOCET/ROXICET) 5-325 MG per tablet 1 tablet (1 tablet Oral Given 11/28/20 0903)  ondansetron (ZOFRAN-ODT) disintegrating tablet 4 mg (4 mg Oral Given 11/28/20 4287)    ED Course  I have reviewed the triage vital signs and the nursing notes.  Pertinent labs & imaging results that were available during my care of the patient were reviewed by me and considered in my medical decision making (see chart for details).    MDM Rules/Calculators/A&P                          Bilateral TMs are clear with no foreign  body noted.  Bilateral nostrils are clear as well.  Patient otherwise appears comfortable tolerating p.o. no respiratory distress or airway impingement noted on exam.  She does have tenderness to the right side lower jaw premolar teeth.  No gingival abscess noted.  Recommend outpatient follow-up with dentistry.  Advised to return for worsening symptoms fevers or any additional concerns.  Final Clinical Impression(s) / ED Diagnoses Final diagnoses:  Dental infection  Epistaxis    Rx / DC Orders ED Discharge Orders         Ordered    oxyCODONE-acetaminophen (PERCOCET/ROXICET) 5-325 MG tablet  Every 6 hours PRN        11/28/20 0930    ondansetron (ZOFRAN) 4 MG tablet  Every 8 hours PRN        11/28/20 0930    amoxicillin (AMOXIL) 500 MG capsule  3 times daily        11/28/20 0930           Luna Fuse, MD 11/28/20 0930

## 2020-11-28 NOTE — Discharge Instructions (Addendum)
Call your primary care doctor or specialist as discussed in the next 2-3 days.   Return immediately back to the ER if:  Your symptoms worsen within the next 12-24 hours. You develop new symptoms such as new fevers, persistent vomiting, new pain, shortness of breath, or new weakness or numbness, or if you have any other concerns.  

## 2020-11-30 ENCOUNTER — Other Ambulatory Visit: Payer: Self-pay

## 2020-12-05 ENCOUNTER — Other Ambulatory Visit: Payer: Self-pay

## 2020-12-06 ENCOUNTER — Ambulatory Visit: Payer: Self-pay

## 2020-12-18 ENCOUNTER — Other Ambulatory Visit: Payer: Self-pay

## 2020-12-18 ENCOUNTER — Emergency Department (HOSPITAL_COMMUNITY)
Admission: EM | Admit: 2020-12-18 | Discharge: 2020-12-18 | Disposition: A | Discharge status: Home / Self Care | Payer: Self-pay | Attending: Emergency Medicine | Admitting: Emergency Medicine

## 2020-12-18 ENCOUNTER — Encounter (HOSPITAL_COMMUNITY): Payer: Self-pay | Admitting: Emergency Medicine

## 2020-12-18 DIAGNOSIS — I10 Essential (primary) hypertension: Secondary | ICD-10-CM | POA: Insufficient documentation

## 2020-12-18 DIAGNOSIS — F17210 Nicotine dependence, cigarettes, uncomplicated: Secondary | ICD-10-CM | POA: Insufficient documentation

## 2020-12-18 DIAGNOSIS — K029 Dental caries, unspecified: Secondary | ICD-10-CM | POA: Insufficient documentation

## 2020-12-18 MED ORDER — KETOROLAC TROMETHAMINE 30 MG/ML IJ SOLN
60.0000 mg | Freq: Once | INTRAMUSCULAR | Status: AC
  Administered 2020-12-18: 60 mg via INTRAMUSCULAR
  Filled 2020-12-18: qty 2

## 2020-12-18 MED ORDER — IBUPROFEN 800 MG PO TABS
800.0000 mg | ORAL_TABLET | Freq: Three times a day (TID) | ORAL | 0 refills | Status: DC
  Filled 2020-12-18 (×2): qty 21, 7d supply, fill #0

## 2020-12-18 MED ORDER — CLINDAMYCIN HCL 300 MG PO CAPS
300.0000 mg | ORAL_CAPSULE | Freq: Once | ORAL | Status: AC
  Administered 2020-12-18: 300 mg via ORAL
  Filled 2020-12-18: qty 1

## 2020-12-18 MED ORDER — CLINDAMYCIN HCL 300 MG PO CAPS
300.0000 mg | ORAL_CAPSULE | Freq: Four times a day (QID) | ORAL | 0 refills | Status: DC
  Filled 2020-12-18: qty 28, 7d supply, fill #0

## 2020-12-18 NOTE — ED Provider Notes (Signed)
[facility] DEPT Provider Note   CSN: [id number] Arrival date & time: 12/18/20  1015     History Chief Complaint  Patient presents with  . Dental Pain    Jennifer [name] is a 60 y.o. female.  HPI Patient reports she still having a lot of dental pain and has not been able to see a dentist yet.  She reports initially her pain and swelling was on the right lower jaw.  She reports that she took Augmentin that was prescribed.  She reports the jaw on the right seem to be getting better but then symptoms developed on the left and then she felt like she had a swollen lymph node.  She reports now she is in a lot of pain again and she is extremely frustrated due to being in pain all the time.  She reports it also feels like there is something moving in her ear.  She has not any had any fever or vomiting.  No shortness of breath or chest pain.  Patient reports that she is trying to get established with a dentist off of the dental resource guide but she is having trouble getting transportation to the facility in [place] or [place] that provides free care.    Past Medical History:  Diagnosis Date  . bipolar   . Bipolar 1 disorder ([place])   . Depression   . Fibroids    now s/p hysterectomy  . Hypertension   . Left ventricular hypertrophy   . Polysubstance abuse ([place])    IV drug [place], cocaine on occassion, smoking and alcoholism, has been going to AA, not completely sober    Patient Active Problem List   Diagnosis Date Noted  . Current smoker 08/12/2016  . Homeless single person 08/12/2016  . Skin lesion of left leg 03/29/2015  . Trigger middle finger of right hand 01/11/2015  . Discoloration of eye 01/11/2015  . Bradycardia 01/11/2015  . Skin lesion of scalp 07/17/2014  . Leg swelling 05/20/2013  . Vasomotor rhinitis 08/17/2012  . Right leg pain 07/26/2012  . Anxiety 02/26/2012  . GERD (gastroesophageal reflux disease) 08/08/2011  . Menopausal symptoms  07/23/2011  . Mastalgia 07/23/2011  . Bipolar disorder, current episode manic without psychotic features, moderate ([place]) 12/05/2010  . 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 SEBACEOUS CYST, SCALP 05/06/2010  . SYNOVIAL CYST 05/03/2010  . Abdominal pain 12/06/2009  . DRUG ABUSE, HX OF 12/06/2009  . Lipoma of shoulder 02/06/2009  . FIBROIDS, UTERUS 02/06/2009  . DRUG ABUSE 02/06/2009  . Essential hypertension, benign 02/06/2009  . PAIN IN JOINT, SHOULDER REGION 02/06/2009  . Insomnia 02/06/2009  . PELVIC MASS 02/06/2009    Past Surgical History:  Procedure Laterality Date  . ABDOMINAL HYSTERECTOMY     2003  . BILATERAL SALPINGOOPHORECTOMY     01/2010  . DILATION AND CURETTAGE OF UTERUS    . EXPLORATORY LAPAROTOMY     complex pelvic mass 2011  . TONSILLECTOMY       OB History    Gravida  5   Para  0   Term  0   Preterm  0   AB  5   Living  0     SAB  2   IAB  1   Ectopic  0   Multiple  0   Live Births              Family History  Problem Relation Age of Onset  . Hypertension Mother   .  Hypertension Sister   . Diabetes Father     Social History   Tobacco Use  . Smoking status: Current Every Day Smoker    Packs/day: 0.50    Years: 20.00    Pack years: 10.00    Types: Cigarettes  . Smokeless tobacco: Never Used  . Tobacco comment: Wants to quit.   Vaping Use  . Vaping Use: Never used  Substance Use Topics  . Alcohol use: Yes    Alcohol/week: 0.0 standard drinks  . Drug use: Not Currently    Types: Cocaine, Marijuana    Home Medications Prior to Admission medications   Medication Sig Start Date End Date Taking? Authorizing Provider  clindamycin (CLEOCIN) 300 MG capsule Take 1 capsule (300 mg total) by mouth 4 (four) times daily for 7 days 12/18/20  Yes Arienne Gartin, Jeannie Done, MD  ibuprofen (ADVIL) 800 MG tablet Take 1 tablet (800 mg total) by mouth 3 (three) times daily. 12/18/20  Yes Jamion Carter, Jeannie Done, MD  amLODipine (NORVASC) 10 MG tablet TAKE 1  TABLET (10 MG TOTAL) BY MOUTH DAILY. 07/09/20 07/09/21  Charlott Rakes, MD  amLODipine (NORVASC) 10 MG tablet TAKE 1 TABLET (10 MG TOTAL) BY MOUTH DAILY 01/23/20 01/22/21  Charlott Rakes, MD  amoxicillin (AMOXIL) 500 MG capsule Take 1 capsule (500 mg total) by mouth 3 (three) times daily. 11/28/20   Luna Fuse, MD  chlorhexidine (PERIDEX) 0.12 % solution USE AS DIRECTED 15 MLS IN THE MOUTH OR THROAT 2 (TWO) TIMES DAILY. 05/16/20 05/16/21  Gildardo Pounds, NP  doxycycline (VIBRA-TABS) 100 MG tablet TAKE 1 TABLET (100 MG TOTAL) BY MOUTH 2 (TWO) TIMES DAILY FOR 7 DAYS. 05/21/20 05/21/21  Gildardo Pounds, NP  gabapentin (NEURONTIN) 300 MG capsule Take 1 capsule (300 mg total) by mouth 2 (two) times daily. Patient not taking: Reported on 12/06/2019 07/18/19   Charlott Rakes, MD  lisinopril-hydrochlorothiazide (ZESTORETIC) 20-12.5 MG tablet TAKE 1 TABLET BY MOUTH DAILY. 11/04/20 11/04/21  Charlott Rakes, MD  metoprolol succinate (TOPROL-XL) 50 MG 24 hr tablet TAKE 1 TABLET (50 MG TOTAL) BY MOUTH DAILY. 11/04/20 11/04/21  Charlott Rakes, MD  metroNIDAZOLE (FLAGYL) 500 MG tablet TAKE 1 TABLET (500 MG TOTAL) BY MOUTH 2 (TWO) TIMES DAILY FOR 7 DAYS. START AFTER COMPLETION OF DOXYCYCLINE 05/21/20 05/21/21  Gildardo Pounds, NP  omeprazole (PRILOSEC) 40 MG capsule Take 1 capsule (40 mg total) by mouth daily. 07/18/19   Charlott Rakes, MD  ondansetron (ZOFRAN) 4 MG tablet Take 1 tablet (4 mg total) by mouth every 8 (eight) hours as needed for nausea or vomiting. 11/28/20   Luna Fuse, MD  oxyCODONE-acetaminophen (PERCOCET/ROXICET) 5-325 MG tablet Take 1 tablet by mouth every 6 (six) hours as needed for severe pain. 11/28/20   Luna Fuse, MD  penicillin v potassium (VEETID) 500 MG tablet TAKE 1 TABLET (500 MG TOTAL) BY MOUTH 4 (FOUR) TIMES DAILY FOR 7 DAYS. 05/16/20 05/16/21  Gildardo Pounds, NP    Allergies    Patient has no known allergies.  Review of Systems   Review of Systems 10 systems reviewed and  negative except as per HPI Physical Exam Updated Vital Signs BP 133/88 (BP Location: Left Arm)   Pulse (!) 56   Temp 98.6 F (37 C) (Oral)   Resp 15   LMP 12/04/2009   SpO2 99%   Physical Exam Constitutional:      Comments: Alert and nontoxic.  Patient is tearful and anxious.  No respiratory distress.  HENT:  Head: Normocephalic and atraumatic.     Comments: No objective facial swelling at this time.    Ears:     Comments: Bilateral TMs normal.  No erythema or bulging.  No foreign objects in the ear canals.    Nose: Nose normal.     Mouth/Throat:     Mouth: Mucous membranes are moist.     Pharynx: Oropharynx is clear.     Comments: Posterior airway is widely patent without erythema or exudate.  Patient has multiple sites of dental decay down to the gumline in the lower teeth and several in the upper teeth that are down to the gumline as well.  Generally dentition is very poor.  There is some beefy notes around the gums where the teeth are decayed down to the remaining root nubs.  No areas or any active appearance of drainage.  The palpation of the floor of the mouth is soft.  She can easily lift her tongue. Cardiovascular:     Rate and Rhythm: Normal rate and regular rhythm.  Pulmonary:     Effort: Pulmonary effort is normal.     Breath sounds: Normal breath sounds.  Musculoskeletal:        General: Normal range of motion.     Cervical back: Neck supple. No rigidity.  Lymphadenopathy:     Cervical: No cervical adenopathy.  Skin:    General: Skin is warm and dry.  Psychiatric:     Comments: Patient is anxious and tearful.     ED Results / Procedures / Treatments   Labs (all labs ordered are listed, but only abnormal results are displayed) Labs Reviewed - No data to display  EKG None  Radiology No results found.  Procedures Procedures   Medications Ordered in ED Medications  ketorolac (TORADOL) 30 MG/ML injection 60 mg (has no administration in time range)   clindamycin (CLEOCIN) capsule 300 mg (has no administration in time range)    ED Course  I have reviewed the triage vital signs and the nursing notes.  Pertinent labs & imaging results that were available during my care of the patient were reviewed by me and considered in my medical decision making (see chart for details).    MDM Rules/Calculators/A&P                          Patient reports ongoing pain and variably swelling from one side of the face to the other despite taking Augmentin.  She is extremely anxious and tearful regarding being in pain all the time and feeling very desperate.  Reports making attempts to get established to get dental care but having transportation difficulty.  At this time no sign of a focal facial abscess and no sign of Ludewig's angina.  Posterior airway widely patent.  Patient is desiring a shot of Toradol and a different antibiotic.  Will give Toradol and clindamycin.  Patient extensively counseled however on the nature of dental decay and abscesses and that although antibiotics can temporize, this will return and potentially be much worse.  She voices understanding. Final Clinical Impression(s) / ED Diagnoses Final diagnoses:  Pain due to dental caries    Rx / DC Orders ED Discharge Orders         Ordered    clindamycin (CLEOCIN) 300 MG capsule  4 times daily        12/18/20 1235    ibuprofen (ADVIL) 800 MG tablet  3 times daily  12/18/20 1235           Charlesetta Shanks, MD 12/18/20 1243

## 2020-12-18 NOTE — Discharge Instructions (Signed)
1.  You must see a dentist to get your decayed teeth extracted.  Antibiotics can temporarily improve pain and swelling but the problem can return and worsen without dental extractions. 2.  Take ibuprofen 800 mg every 8 hours with food if needed for pain control.  You may also take over-the-counter extra strength Tylenol every 6 hours for additional pain control.

## 2020-12-18 NOTE — ED Notes (Signed)
Patient educated on the post IM injection and wait time. Patient insisted she had to leave and left before the discharge process could be completed.

## 2020-12-18 NOTE — ED Notes (Addendum)
Pt asked to speak to this Probation officer.  She is very upset and stating "nothing has been done."  Pt educated that unfortunately, [employer] isn't always fast, a provider should be seeing her soon, and reassured her that she has been seen and assessed by a nurse.  Pt feels this isn't adequate.  Pt asked to be respectful to staff.  This Probation officer asked an EDP to see the Pt.

## 2020-12-18 NOTE — ED Triage Notes (Signed)
Pt reports was seen here 2 weeks ago and was given antibiotics for dental infection. Reports still there and now on left side as well.

## 2020-12-19 ENCOUNTER — Other Ambulatory Visit: Payer: Self-pay

## 2020-12-28 ENCOUNTER — Other Ambulatory Visit: Payer: Self-pay | Admitting: Family Medicine

## 2020-12-28 ENCOUNTER — Other Ambulatory Visit: Payer: Self-pay

## 2020-12-28 DIAGNOSIS — I10 Essential (primary) hypertension: Secondary | ICD-10-CM

## 2020-12-28 MED ORDER — LISINOPRIL-HYDROCHLOROTHIAZIDE 20-12.5 MG PO TABS
1.0000 | ORAL_TABLET | Freq: Every day | ORAL | 0 refills | Status: DC
  Filled 2020-12-28: qty 30, 30d supply, fill #0

## 2020-12-28 MED ORDER — METOPROLOL SUCCINATE ER 50 MG PO TB24
ORAL_TABLET | Freq: Every day | ORAL | 0 refills | Status: DC
  Filled 2020-12-28: qty 30, 30d supply, fill #0

## 2020-12-28 MED FILL — Amlodipine Besylate Tab 10 MG (Base Equivalent): ORAL | 30 days supply | Qty: 30 | Fill #1 | Status: AC

## 2021-01-01 ENCOUNTER — Other Ambulatory Visit: Payer: Self-pay

## 2021-01-08 ENCOUNTER — Ambulatory Visit: Payer: Self-pay | Admitting: Family Medicine

## 2021-01-31 ENCOUNTER — Other Ambulatory Visit: Payer: Self-pay | Admitting: Family Medicine

## 2021-01-31 ENCOUNTER — Other Ambulatory Visit: Payer: Self-pay

## 2021-01-31 DIAGNOSIS — I10 Essential (primary) hypertension: Secondary | ICD-10-CM

## 2021-01-31 MED ORDER — AMLODIPINE BESYLATE 10 MG PO TABS
ORAL_TABLET | Freq: Every day | ORAL | 0 refills | Status: DC
  Filled 2021-01-31: qty 30, 30d supply, fill #0

## 2021-01-31 MED ORDER — LISINOPRIL-HYDROCHLOROTHIAZIDE 20-12.5 MG PO TABS
1.0000 | ORAL_TABLET | Freq: Every day | ORAL | 0 refills | Status: DC
  Filled 2021-01-31: qty 30, 30d supply, fill #0

## 2021-01-31 MED ORDER — METOPROLOL SUCCINATE ER 50 MG PO TB24
ORAL_TABLET | Freq: Every day | ORAL | 0 refills | Status: DC
  Filled 2021-01-31: qty 30, 30d supply, fill #0

## 2021-01-31 NOTE — Telephone Encounter (Signed)
Notes to clinic: Patient was a no show to office visit on 01/08/2021 Review for another courtesy refill   Requested Prescriptions  Pending Prescriptions Disp Refills   amLODipine (NORVASC) 10 MG tablet 30 tablet 6    Sig: TAKE 1 TABLET (10 MG TOTAL) BY MOUTH DAILY      Cardiovascular:  Calcium Channel Blockers Failed - 01/31/2021 11:10 AM      Failed - Valid encounter within last 6 months    Recent Outpatient Visits           8 months ago Bilateral hand pain   [place], Vernia Buff, NP   11 months ago Pain, dental   [place], [place], NP   1 year ago Trichomonas vaginitis   [place], [place], MD   1 year ago Primary osteoarthritis of both hands   [place], [place], MD   1 year ago Carpal tunnel syndrome of right wrist   [place], Charlane Ferretti, MD                Passed - Last BP in normal range    BP Readings from Last 1 Encounters:  12/18/20 133/88            metoprolol succinate (TOPROL-XL) 50 MG 24 hr tablet 30 tablet 0    Sig: TAKE 1 TABLET (50 MG TOTAL) BY MOUTH DAILY.      Cardiovascular:  Beta Blockers Failed - 01/31/2021 11:10 AM      Failed - Valid encounter within last 6 months    Recent Outpatient Visits           8 months ago Bilateral hand pain   [place], Vernia Buff, NP   11 months ago Pain, dental   [place], [place], NP   1 year ago Trichomonas vaginitis   [place], [place], MD   1 year ago Primary osteoarthritis of both hands   [place], [place], MD   1 year ago Carpal tunnel syndrome of right wrist   [place], Charlane Ferretti, MD                Passed - Last  BP in normal range    BP Readings from Last 1 Encounters:  12/18/20 133/88          Passed - Last Heart Rate in normal range    Pulse Readings from Last 1 Encounters:  12/18/20 (!) 56            lisinopril-hydrochlorothiazide (ZESTORETIC) 20-12.5 MG tablet 30 tablet 0    Sig: TAKE 1 TABLET BY MOUTH DAILY.      Cardiovascular:  ACEI + Diuretic Combos Failed - 01/31/2021 11:10 AM      Failed - Na in normal range and within 180 days    Sodium  Date Value Ref Range Status  03/28/2019 139 135 - 145 mmol/L Final          Failed - K in normal range and within 180 days    Potassium  Date Value Ref Range Status  03/28/2019 4.4 3.5 - 5.1 mmol/L Final          Failed - Cr in normal range and within  180 days    Creat  Date Value Ref Range Status  09/30/2016 1.15 (H) 0.50 - 1.05 mg/dL Final    Comment:      For patients > or = 58 years of age: The upper reference limit for Creatinine is approximately 13% higher for people identified as African-American.      Creatinine, Ser  Date Value Ref Range Status  03/28/2019 0.86 0.44 - 1.00 mg/dL Final   Creatinine,U  Date Value Ref Range Status  12/15/2009  mg/dL Final   87.6 (NOTE)  Cutoff Values for Urine Drug Screen:        Drug Class           Cutoff (ng/mL)        Amphetamines            1000        Barbiturates             200        Cocaine Metabolites      300        Benzodiazepines          200        Methadone                 300        Opiates                 2000        Phencyclidine             25        Propoxyphene             300        Marijuana Metabolites     50  For medical purposes only.   Creatinine, Urine  Date Value Ref Range Status  08/08/2011 223.4 20 OR > MG/DL Final          Failed - Ca in normal range and within 180 days    Calcium  Date Value Ref Range Status  03/28/2019 9.5 8.9 - 10.3 mg/dL Final   Calcium, Ion  Date Value Ref Range Status  02/11/2016 1.16 1.13 - 1.30 mmol/L Final           Failed - Valid encounter within last 6 months    Recent Outpatient Visits           8 months ago Bilateral hand pain   [place], Vernia Buff, NP   11 months ago Pain, dental   [place], [place], NP   1 year ago Trichomonas vaginitis   [place], [place], MD   1 year ago Primary osteoarthritis of both hands   [place], [place], MD   1 year ago Carpal tunnel syndrome of right wrist   [place], Charlane Ferretti, MD                Passed - Patient is not pregnant      Passed - Last BP in normal range    BP Readings from Last 1 Encounters:  12/18/20 133/88

## 2021-02-01 ENCOUNTER — Other Ambulatory Visit: Payer: Self-pay

## 2021-02-23 ENCOUNTER — Other Ambulatory Visit: Payer: Self-pay

## 2021-02-23 ENCOUNTER — Ambulatory Visit (HOSPITAL_COMMUNITY)
Admission: EM | Admit: 2021-02-23 | Discharge: 2021-02-23 | Disposition: A | Discharge status: Home / Self Care | Payer: No Payment, Other | Attending: Behavioral Health | Admitting: Behavioral Health

## 2021-02-23 DIAGNOSIS — F411 Generalized anxiety disorder: Secondary | ICD-10-CM | POA: Insufficient documentation

## 2021-02-23 DIAGNOSIS — Z046 Encounter for general psychiatric examination, requested by authority: Secondary | ICD-10-CM

## 2021-02-23 DIAGNOSIS — I10 Essential (primary) hypertension: Secondary | ICD-10-CM | POA: Insufficient documentation

## 2021-02-23 DIAGNOSIS — F1020 Alcohol dependence, uncomplicated: Secondary | ICD-10-CM | POA: Insufficient documentation

## 2021-02-23 DIAGNOSIS — Z79899 Other long term (current) drug therapy: Secondary | ICD-10-CM | POA: Insufficient documentation
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
## 2021-02-23 NOTE — Discharge Instructions (Addendum)
Out patient psychiatric resources provided.     Patient has been instructed & cautioned: To not engage in alcohol and or illegal drug use while on prescription medicines. In the event of worsening symptoms, patient is instructed to call the crisis hotline, 911 and or go to the nearest ED for appropriate evaluation and treatment of symptoms. To follow-up with his/her primary care provider for your other medical issues, concerns and or health care needs.
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
## 2021-02-23 NOTE — ED Notes (Signed)
Patient seen by provider and TTS in [place].  Hoyle Sauer, NP printed and reviewed AVS with patient.  Patient discharged in stable condition with [employer] for transportation home.
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
## 2021-03-01 ENCOUNTER — Other Ambulatory Visit: Payer: Self-pay

## 2021-03-01 ENCOUNTER — Telehealth: Payer: Self-pay

## 2021-03-01 ENCOUNTER — Other Ambulatory Visit: Payer: Self-pay | Admitting: Family Medicine

## 2021-03-01 DIAGNOSIS — I10 Essential (primary) hypertension: Secondary | ICD-10-CM

## 2021-03-01 NOTE — Telephone Encounter (Signed)
Requested medication (s) are due for refill today:  yes   Requested medication (s) are on the active medication list: yes   Last refill:  02/01/2021  Future visit scheduled: no  Notes to clinic: overdue for follow up  Several attempts made to contact patient    Requested Prescriptions  Pending Prescriptions Disp Refills   amLODipine (NORVASC) 10 MG tablet 30 tablet 0    Sig: TAKE 1 TABLET (10 MG TOTAL) BY MOUTH DAILY      Cardiovascular:  Calcium Channel Blockers Failed - 03/01/2021 11:05 AM      Failed - Valid encounter within last 6 months    Recent Outpatient Visits           9 months ago Bilateral hand pain   [place], Vernia Buff, NP   12 months ago Pain, dental   [place], [place], NP   1 year ago Trichomonas vaginitis   [place], [place], MD   1 year ago Primary osteoarthritis of both hands   [place], [place], MD   1 year ago Carpal tunnel syndrome of right wrist   [place], Charlane Ferretti, MD                Passed - Last BP in normal range    BP Readings from Last 1 Encounters:  12/18/20 133/88            metoprolol succinate (TOPROL-XL) 50 MG 24 hr tablet 30 tablet 0    Sig: TAKE 1 TABLET (50 MG TOTAL) BY MOUTH DAILY.      Cardiovascular:  Beta Blockers Failed - 03/01/2021 11:05 AM      Failed - Valid encounter within last 6 months    Recent Outpatient Visits           9 months ago Bilateral hand pain   [place], Vernia Buff, NP   12 months ago Pain, dental   [place], [place], NP   1 year ago Trichomonas vaginitis   [place], [place], MD   1 year ago Primary osteoarthritis of both hands   [place],  Enobong, MD   1 year ago Carpal tunnel syndrome of right wrist   [place], Charlane Ferretti, MD                Passed - Last BP in normal range    BP Readings from Last 1 Encounters:  12/18/20 133/88          Passed - Last Heart Rate in normal range    Pulse Readings from Last 1 Encounters:  12/18/20 (!) 56            lisinopril-hydrochlorothiazide (ZESTORETIC) 20-12.5 MG tablet 30 tablet 0    Sig: TAKE 1 TABLET BY MOUTH DAILY.      Cardiovascular:  ACEI + Diuretic Combos Failed - 03/01/2021 11:05 AM      Failed - Na in normal range and within 180 days    Sodium  Date Value Ref Range Status  03/28/2019 139 135 - 145 mmol/L Final          Failed - K in normal range and within 180 days    Potassium  Date Value Ref Range Status  03/28/2019 4.4 3.5 - 5.1 mmol/L Final          Failed - Cr in normal range and within 180 days    Creat  Date Value Ref Range Status  09/30/2016 1.15 (H) 0.50 - 1.05 mg/dL Final    Comment:      For patients > or = 58 years of age: The upper reference limit for Creatinine is approximately 13% higher for people identified as African-American.      Creatinine, Ser  Date Value Ref Range Status  03/28/2019 0.86 0.44 - 1.00 mg/dL Final   Creatinine,U  Date Value Ref Range Status  12/15/2009  mg/dL Final   87.6 (NOTE)  Cutoff Values for Urine Drug Screen:        Drug Class           Cutoff (ng/mL)        Amphetamines            1000        Barbiturates             200        Cocaine Metabolites      300        Benzodiazepines          200        Methadone                 300        Opiates                 2000        Phencyclidine             25        Propoxyphene             300        Marijuana Metabolites     50  For medical purposes only.   Creatinine, Urine  Date Value Ref Range Status  08/08/2011 223.4 20 OR > MG/DL Final          Failed - Ca in normal range and within 180 days    Calcium   Date Value Ref Range Status  03/28/2019 9.5 8.9 - 10.3 mg/dL Final   Calcium, Ion  Date Value Ref Range Status  02/11/2016 1.16 1.13 - 1.30 mmol/L Final          Failed - Valid encounter within last 6 months    Recent Outpatient Visits           9 months ago Bilateral hand pain   [place], Vernia Buff, NP   12 months ago Pain, dental   [place], [place], NP   1 year ago Trichomonas vaginitis   [place], Enobong, MD   1 year ago Primary osteoarthritis of both hands   [place], [place], MD   1 year ago Carpal tunnel syndrome of right wrist   [place], Charlane Ferretti, MD                Passed - Patient is not pregnant      Passed - Last BP in normal range    BP Readings from Last 1 Encounters:  12/18/20 133/88

## 2021-03-01 NOTE — Telephone Encounter (Signed)
Pt needs to schedule an appointment with PCP to get refills for medication.

## 2021-03-04 ENCOUNTER — Other Ambulatory Visit: Payer: Self-pay

## 2021-03-04 ENCOUNTER — Ambulatory Visit: Payer: Self-pay | Admitting: Family Medicine

## 2021-03-05 ENCOUNTER — Other Ambulatory Visit: Payer: Self-pay

## 2021-03-05 ENCOUNTER — Emergency Department (HOSPITAL_COMMUNITY)
Admission: EM | Admit: 2021-03-05 | Discharge: 2021-03-05 | Disposition: A | Discharge status: Home / Self Care | Payer: Self-pay | Attending: Emergency Medicine | Admitting: Emergency Medicine

## 2021-03-05 ENCOUNTER — Encounter: Payer: Self-pay | Admitting: Family Medicine

## 2021-03-05 ENCOUNTER — Ambulatory Visit: Payer: Self-pay | Attending: Family Medicine | Admitting: Family Medicine

## 2021-03-05 VITALS — BP 138/90 | HR 76 | Ht 64.0 in | Wt 185.6 lb

## 2021-03-05 DIAGNOSIS — K2950 Unspecified chronic gastritis without bleeding: Secondary | ICD-10-CM

## 2021-03-05 DIAGNOSIS — M79601 Pain in right arm: Secondary | ICD-10-CM | POA: Insufficient documentation

## 2021-03-05 DIAGNOSIS — R112 Nausea with vomiting, unspecified: Secondary | ICD-10-CM | POA: Insufficient documentation

## 2021-03-05 DIAGNOSIS — M549 Dorsalgia, unspecified: Secondary | ICD-10-CM | POA: Insufficient documentation

## 2021-03-05 DIAGNOSIS — K0889 Other specified disorders of teeth and supporting structures: Secondary | ICD-10-CM

## 2021-03-05 DIAGNOSIS — Z5321 Procedure and treatment not carried out due to patient leaving prior to being seen by health care provider: Secondary | ICD-10-CM | POA: Insufficient documentation

## 2021-03-05 DIAGNOSIS — I10 Essential (primary) hypertension: Secondary | ICD-10-CM

## 2021-03-05 DIAGNOSIS — G8929 Other chronic pain: Secondary | ICD-10-CM

## 2021-03-05 MED ORDER — AMOXICILLIN 500 MG PO CAPS
500.0000 mg | ORAL_CAPSULE | Freq: Three times a day (TID) | ORAL | 0 refills | Status: DC
  Filled 2021-03-05: qty 21, 7d supply, fill #0

## 2021-03-05 MED ORDER — OMEPRAZOLE 40 MG PO CPDR
40.0000 mg | DELAYED_RELEASE_CAPSULE | Freq: Every day | ORAL | 6 refills | Status: DC
  Filled 2021-03-05: qty 30, 30d supply, fill #0
  Filled 2021-04-03: qty 30, 30d supply, fill #1

## 2021-03-05 MED ORDER — AMLODIPINE BESYLATE 10 MG PO TABS
ORAL_TABLET | Freq: Every day | ORAL | 6 refills | Status: DC
  Filled 2021-03-05: qty 30, 30d supply, fill #0
  Filled 2021-04-03: qty 30, 30d supply, fill #1
  Filled 2021-04-22: qty 30, 30d supply, fill #2
  Filled 2021-05-13: qty 30, 30d supply, fill #3
  Filled 2021-06-03: qty 30, 30d supply, fill #4
  Filled 2021-07-01: qty 30, 30d supply, fill #5
  Filled 2021-07-24: qty 30, 30d supply, fill #6

## 2021-03-05 MED ORDER — LISINOPRIL-HYDROCHLOROTHIAZIDE 20-12.5 MG PO TABS
1.0000 | ORAL_TABLET | Freq: Every day | ORAL | 6 refills | Status: DC
  Filled 2021-03-05: qty 30, 30d supply, fill #0
  Filled 2021-04-03: qty 30, 30d supply, fill #1
  Filled 2021-04-22: qty 30, 30d supply, fill #2
  Filled 2021-05-13: qty 30, 30d supply, fill #3
  Filled 2021-06-03: qty 30, 30d supply, fill #4
  Filled 2021-07-01: qty 30, 30d supply, fill #5
  Filled 2021-07-24: qty 30, 30d supply, fill #6

## 2021-03-05 MED ORDER — KETOROLAC TROMETHAMINE 60 MG/2ML IM SOLN
60.0000 mg | Freq: Once | INTRAMUSCULAR | Status: AC
  Administered 2021-03-05: 60 mg via INTRAMUSCULAR

## 2021-03-05 MED ORDER — METOPROLOL SUCCINATE ER 50 MG PO TB24
ORAL_TABLET | Freq: Every day | ORAL | 6 refills | Status: DC
  Filled 2021-03-05: qty 30, 30d supply, fill #0
  Filled 2021-04-03: qty 30, 30d supply, fill #1
  Filled 2021-04-22: qty 30, 30d supply, fill #2
  Filled 2021-05-13: qty 30, 30d supply, fill #3
  Filled 2021-06-03: qty 30, 30d supply, fill #4
  Filled 2021-07-01: qty 30, 30d supply, fill #5
  Filled 2021-07-24: qty 30, 30d supply, fill #6

## 2021-03-05 NOTE — Patient Instructions (Signed)
Gastritis, Adult  Gastritis is swelling (inflammation) of the stomach. Gastritis can develop quickly (acute). It can also develop slowly over time (chronic). It is important to get help for this condition. If you do not get help, your stomach can bleed, and you can get sores (ulcers) in your stomach. What are the causes? This condition may be caused by: Germs that get to your stomach. Drinking too much alcohol. Medicines you are taking. Too much acid in the stomach. A disease of the intestines or stomach. Stress. An allergic reaction. Crohn's disease. Some cancer treatments (radiation). Sometimes the cause of this condition is not known. What are the signs or symptoms? Symptoms of this condition include: Pain in your stomach. A burning feeling in your stomach. Feeling sick to your stomach (nauseous). Throwing up (vomiting). Feeling too full after you eat. Weight loss. Bad breath. Throwing up blood. Blood in your poop (stool). How is this diagnosed? This condition may be diagnosed with: Your medical history and symptoms. A physical exam. Tests. These can include: Blood tests. Stool tests. A procedure to look inside your stomach (upper endoscopy). A test in which a sample of tissue is taken for testing (biopsy). How is this treated? Treatment for this condition depends on what caused it. You may be given: Antibiotic medicine, if your condition was caused by germs. H2 blockers and similar medicines, if your condition was caused by too much acid. Follow these instructions at home: Medicines Take over-the-counter and prescription medicines only as told by your doctor. If you were prescribed an antibiotic medicine, take it as told by your doctor. Do not stop taking it even if you start to feel better. Eating and drinking  Eat small meals often, instead of large meals. Avoid foods and drinks that make your symptoms worse. Drink enough fluid to keep your pee (urine) pale  yellow.  Alcohol use Do not drink alcohol if: Your doctor tells you not to drink. You are pregnant, may be pregnant, or are planning to become pregnant. If you drink alcohol: Limit your use to: 0-1 drink a day for women. 0-2 drinks a day for men. Be aware of how much alcohol is in your drink. In the [place] one drink equals one 12 oz bottle of beer (355 mL), one 5 oz glass of wine (148 mL), or one 1 oz glass of hard liquor (44 mL). General instructions Talk with your doctor about ways to manage stress. You can exercise or do deep breathing, meditation, or yoga. Do not smoke or use products that have nicotine or tobacco. If you need help quitting, ask your doctor. Keep all follow-up visits as told by your doctor. This is important. Contact a doctor if: Your symptoms get worse. Your symptoms go away and then come back. Get help right away if: You throw up blood or something that looks like coffee grounds. You have black or dark red poop. You throw up any time you try to drink fluids. Your stomach pain gets worse. You have a fever. You do not feel better after one week. Summary Gastritis is swelling (inflammation) of the stomach. You must get help for this condition. If you do not get help, your stomach can bleed, and you can get sores (ulcers). This condition is diagnosed with medical history, physical exam, or tests. You can be treated with medicines for germs or medicines to block too much acid in your stomach. This information is not intended to replace advice given to you by your health care   provider. Make sure you discuss any questions you have with your healthcare provider. Document Revised: 12/15/2017 Document Reviewed: 12/15/2017 Elsevier Patient Education  2022 [employer]  

## 2021-03-05 NOTE — Progress Notes (Signed)
Subjective:  [name], female    DOB: 1961-02-02  Age: 59 y.o. MRN: [id number]  CC: Hypertension   HPI Jennifer [name] is a 60 year old female with a history of hypertension, homelessness here for chronic disease management.  Last office visit was in 11/2019.  Interval History: She was throwing up last night and her whole body was hurting as a result of vomiting.  Complains of when she is out of her blood pressure medications which she has been she has vomiting.  States she cooked the food she ate herself.  Denies presence of abdominal pain, diarrhea. Complains her teeth are bad and she needs antibiotics. Complains of chronic body aches which is worse with her throwing up.she is requesting to receive a Toradol injection for her pain.  Also endorses throwing up every morning despite avoiding eating late. Past Medical History:  Diagnosis Date   bipolar    Bipolar 1 disorder ([place])    Depression    Fibroids    now s/p hysterectomy   Hypertension    Left ventricular hypertrophy    Polysubstance abuse ([place])    IV drug [place], cocaine on occassion, smoking and alcoholism, has been going to AA, not completely sober    Past Surgical History:  Procedure Laterality Date   ABDOMINAL HYSTERECTOMY     2003   BILATERAL SALPINGOOPHORECTOMY     01/2010   DILATION AND CURETTAGE OF UTERUS     EXPLORATORY LAPAROTOMY     complex pelvic mass 2011   TONSILLECTOMY      Family History  Problem Relation Age of Onset   Hypertension Mother    Hypertension Sister    Diabetes Father     No Known Allergies  Outpatient Medications Prior to Visit  Medication Sig Dispense Refill   gabapentin (NEURONTIN) 300 MG capsule Take 1 capsule (300 mg total) by mouth 2 (two) times daily. 60 capsule 3   ibuprofen (ADVIL) 800 MG tablet Take 1 tablet (800 mg total) by mouth 3 (three) times daily. 21 tablet 0   amLODipine (NORVASC) 10 MG tablet TAKE 1 TABLET (10 MG TOTAL) BY MOUTH DAILY. 30  tablet 1   lisinopril-hydrochlorothiazide (ZESTORETIC) 20-12.5 MG tablet TAKE 1 TABLET BY MOUTH DAILY. 30 tablet 0   metoprolol succinate (TOPROL-XL) 50 MG 24 hr tablet TAKE 1 TABLET (50 MG TOTAL) BY MOUTH DAILY. 30 tablet 0   chlorhexidine (PERIDEX) 0.12 % solution USE AS DIRECTED 15 MLS IN THE MOUTH OR THROAT 2 (TWO) TIMES DAILY. (Patient not taking: Reported on 03/05/2021) 473 mL 0   ondansetron (ZOFRAN) 4 MG tablet Take 1 tablet (4 mg total) by mouth every 8 (eight) hours as needed for nausea or vomiting. (Patient not taking: Reported on 03/05/2021) 4 tablet 0   oxyCODONE-acetaminophen (PERCOCET/ROXICET) 5-325 MG tablet Take 1 tablet by mouth every 6 (six) hours as needed for severe pain. (Patient not taking: Reported on 03/05/2021) 6 tablet 0   amLODipine (NORVASC) 10 MG tablet TAKE 1 TABLET (10 MG TOTAL) BY MOUTH DAILY 30 tablet 0   amoxicillin (AMOXIL) 500 MG capsule Take 1 capsule (500 mg total) by mouth 3 (three) times daily. (Patient not taking: Reported on 03/05/2021) 21 capsule 0   clindamycin (CLEOCIN) 300 MG capsule Take 1 capsule (300 mg total) by mouth 4 (four) times daily for 7 days (Patient not taking: Reported on 03/05/2021) 28 capsule 0   doxycycline (VIBRA-TABS) 100 MG tablet TAKE 1 TABLET (100 MG TOTAL) BY MOUTH 2 (  TWO) TIMES DAILY FOR 7 DAYS. (Patient not taking: Reported on 03/05/2021) 14 tablet 0   metroNIDAZOLE (FLAGYL) 500 MG tablet TAKE 1 TABLET (500 MG TOTAL) BY MOUTH 2 (TWO) TIMES DAILY FOR 7 DAYS. START AFTER COMPLETION OF DOXYCYCLINE (Patient not taking: Reported on 03/05/2021) 14 tablet 0   omeprazole (PRILOSEC) 40 MG capsule Take 1 capsule (40 mg total) by mouth daily. (Patient not taking: Reported on 03/05/2021) 30 capsule 6   penicillin v potassium (VEETID) 500 MG tablet TAKE 1 TABLET (500 MG TOTAL) BY MOUTH 4 (FOUR) TIMES DAILY FOR 7 DAYS. (Patient not taking: Reported on 03/05/2021) 28 tablet 0   No facility-administered medications prior to visit.     ROS Review of  Systems  Constitutional:  Negative for activity change, appetite change and fatigue.  HENT:  Positive for dental problem. Negative for congestion, sinus pressure and sore throat.   Eyes:  Negative for visual disturbance.  Respiratory:  Negative for cough, chest tightness, shortness of breath and wheezing.   Cardiovascular:  Negative for chest pain and palpitations.  Gastrointestinal:  Positive for nausea and vomiting. Negative for abdominal distention, abdominal pain and constipation.  Endocrine: Negative for polydipsia.  Genitourinary:  Negative for dysuria and frequency.  Musculoskeletal:  Positive for myalgias. Negative for arthralgias and back pain.  Skin:  Negative for rash.  Neurological:  Negative for tremors, light-headedness and numbness.  Hematological:  Does not bruise/bleed easily.  Psychiatric/Behavioral:  Negative for agitation and behavioral problems.    Objective:  BP 138/90   Pulse 76   Ht 5' 4"  (1.626 m)   Wt 185 lb 9.6 oz (84.2 kg)   LMP 12/04/2009   SpO2 96%   BMI 31.86 kg/m   BP/Weight 03/05/2021 03/05/2021 5/32/9924  Systolic BP 268 341 962  Diastolic BP 90 99 88  Wt. (Lbs) 185.6 - -  BMI 31.86 - -  Some encounter information is confidential and restricted. Go to Review Flowsheets activity to see all data.      Physical Exam Constitutional:      Appearance: She is well-developed.  Cardiovascular:     Rate and Rhythm: Normal rate.     Heart sounds: Normal heart sounds. No murmur heard. Pulmonary:     Effort: Pulmonary effort is normal.     Breath sounds: Normal breath sounds. No wheezing or rales.  Chest:     Chest wall: No tenderness.  Abdominal:     General: Bowel sounds are normal. There is no distension.     Palpations: Abdomen is soft. There is no mass.     Tenderness: There is no abdominal tenderness.  Musculoskeletal:        General: Normal range of motion.     Right lower leg: No edema.     Left lower leg: No edema.  Neurological:      Mental Status: She is alert and oriented to person, place, and time.  Psychiatric:        Mood and Affect: Mood normal.    CMP Latest Ref Rng & Units 03/28/2019 06/16/2018 02/15/2018  Glucose 70 - 99 mg/dL 91 81 94  BUN 6 - 20 mg/dL 19 16 10   Creatinine 0.44 - 1.00 mg/dL 0.86 0.84 0.81  Sodium 135 - 145 mmol/L 139 138 143  Potassium 3.5 - 5.1 mmol/L 4.4 4.2 4.2  Chloride 98 - 111 mmol/L 106 104 111  CO2 22 - 32 mmol/L 24 26 26   Calcium 8.9 - 10.3 mg/dL 9.5 9.9 9.2  Total Protein 6.5 - 8.1 g/dL - - 6.5  Total Bilirubin 0.3 - 1.2 mg/dL - - 0.5  Alkaline Phos 38 - 126 U/L - - 59  AST 15 - 41 U/L - - 19  ALT 0 - 44 U/L - - 25    Lipid Panel     Component Value Date/Time   CHOL 177 02/06/2009 2025   TRIG 165 (H) 02/06/2009 2025   HDL 45 02/06/2009 2025   CHOLHDL 3.9 Ratio 02/06/2009 2025   VLDL 33 02/06/2009 2025   LDLCALC 99 02/06/2009 2025    CBC    Component Value Date/Time   WBC 4.9 03/28/2019 1205   RBC 4.87 03/28/2019 1205   HGB 14.2 03/28/2019 1205   HCT 44.3 03/28/2019 1205   PLT 207 03/28/2019 1205   MCV 91.0 03/28/2019 1205   MCH 29.2 03/28/2019 1205   MCHC 32.1 03/28/2019 1205   RDW 14.2 03/28/2019 1205   LYMPHSABS 1.9 03/28/2019 1205   MONOABS 0.4 03/28/2019 1205   EOSABS 0.1 03/28/2019 1205   BASOSABS 0.0 03/28/2019 1205    Lab Results  Component Value Date   HGBA1C 5.8 02/06/2009    Assessment & Plan:  1. Essential hypertension Controlled Continue current regimen Counseled on blood pressure goal of less than 130/80, low-sodium, DASH diet, medication compliance, 150 minutes of moderate intensity exercise per week. Discussed medication compliance, adverse effects. - amLODipine (NORVASC) 10 MG tablet; TAKE 1 TABLET (10 MG TOTAL) BY MOUTH DAILY.  Dispense: 30 tablet; Refill: 6 - lisinopril-hydrochlorothiazide (ZESTORETIC) 20-12.5 MG tablet; TAKE 1 TABLET BY MOUTH DAILY.  Dispense: 30 tablet; Refill: 6 - metoprolol succinate (TOPROL-XL) 50 MG 24 hr  tablet; TAKE 1 TABLET (50 MG TOTAL) BY MOUTH DAILY.  Dispense: 30 tablet; Refill: 6 - CMP14+EGFR  2. Other chronic gastritis without hemorrhage Could be combination of Gastroenteritis and gastritis Ordered H. pylori breath test but patient left prior to testing performed Will initiate PPI - H. pylori breath test - omeprazole (PRILOSEC) 40 MG capsule; Take 1 capsule (40 mg total) by mouth daily.  Dispense: 30 capsule; Refill: 6  3. Tooth ache - amoxicillin (AMOXIL) 500 MG capsule; Take 1 capsule (500 mg total) by mouth 3 (three) times daily.  Dispense: 21 capsule; Refill: 0  4. Other chronic pain - ketorolac (TORADOL) injection 60 mg   Meds ordered this encounter  Medications   amoxicillin (AMOXIL) 500 MG capsule    Sig: Take 1 capsule (500 mg total) by mouth 3 (three) times daily.    Dispense:  21 capsule    Refill:  0   amLODipine (NORVASC) 10 MG tablet    Sig: TAKE 1 TABLET (10 MG TOTAL) BY MOUTH DAILY.    Dispense:  30 tablet    Refill:  6   lisinopril-hydrochlorothiazide (ZESTORETIC) 20-12.5 MG tablet    Sig: TAKE 1 TABLET BY MOUTH DAILY.    Dispense:  30 tablet    Refill:  6   metoprolol succinate (TOPROL-XL) 50 MG 24 hr tablet    Sig: TAKE 1 TABLET (50 MG TOTAL) BY MOUTH DAILY.    Dispense:  30 tablet    Refill:  6   omeprazole (PRILOSEC) 40 MG capsule    Sig: Take 1 capsule (40 mg total) by mouth daily.    Dispense:  30 capsule    Refill:  6   ketorolac (TORADOL) injection 60 mg    Follow-up: Return in about 6 months (around 09/05/2021) for Medical conditions.  Charlott Rakes, MD, FAAFP. [facility] and [facility], [place]   03/05/2021, 12:47 PM

## 2021-03-05 NOTE — ED Triage Notes (Signed)
Pt from home, n/v, back pain, R arm pain since yesterday and worsening. PA attempting to do MSE screening and pt states "if you're going to talk to me like i'm a child i'm leaving" and stormed out of triage room.

## 2021-03-07 LAB — H. PYLORI BREATH TEST: H pylori Breath Test: NEGATIVE

## 2021-03-09 LAB — CMP14+EGFR
ALT: 14 IU/L (ref 0–32)
AST: 21 IU/L (ref 0–40)
Albumin/Globulin Ratio: 1.6 (ref 1.2–2.2)
Albumin: 4.6 g/dL (ref 3.8–4.9)
Alkaline Phosphatase: 63 IU/L (ref 44–121)
BUN/Creatinine Ratio: 19 (ref 9–23)
BUN: 19 mg/dL (ref 6–24)
Bilirubin Total: 0.4 mg/dL (ref 0.0–1.2)
CO2: 17 mmol/L — ABNORMAL LOW (ref 20–29)
Calcium: 10.7 mg/dL — ABNORMAL HIGH (ref 8.7–10.2)
Chloride: 103 mmol/L (ref 96–106)
Creatinine, Ser: 0.98 mg/dL (ref 0.57–1.00)
Globulin, Total: 2.9 g/dL (ref 1.5–4.5)
Glucose: 88 mg/dL (ref 65–99)
Potassium: 4.2 mmol/L (ref 3.5–5.2)
Sodium: 142 mmol/L (ref 134–144)
Total Protein: 7.5 g/dL (ref 6.0–8.5)
eGFR: 67 mL/min/{1.73_m2} (ref >59)

## 2021-03-14 ENCOUNTER — Ambulatory Visit: Payer: Self-pay | Admitting: *Deleted

## 2021-03-14 ENCOUNTER — Other Ambulatory Visit: Payer: Self-pay

## 2021-03-14 ENCOUNTER — Ambulatory Visit: Payer: Self-pay

## 2021-03-14 VITALS — BP 138/82 | Wt 192.2 lb

## 2021-03-14 DIAGNOSIS — N644 Mastodynia: Secondary | ICD-10-CM

## 2021-03-14 DIAGNOSIS — M79621 Pain in right upper arm: Secondary | ICD-10-CM

## 2021-03-14 DIAGNOSIS — Z1239 Encounter for other screening for malignant neoplasm of breast: Secondary | ICD-10-CM

## 2021-03-14 NOTE — Patient Instructions (Addendum)
Explained breast self awareness with Jennifer [name]. Patient did not need a Pap smear today due to patient has a history of a hysterectomy for benign reasons. Let patient know that she doesn't need any further Pap smears due to her history of a hysterectomy for benign reasons. Referred patient to the [facility] for a diagnostic mammogram. Appointment scheduled Tuesday, March 26, 2021 at 1400. Patient aware of appointment and will be there. Discussed smoking cessation with patient. Referred to the [facility] Quitline and gave resources to the free smoking cessation classes at [facility].Jennifer [name] verbalized understanding.  Shulem Mader, Arvil Chaco, RN 12:35 PM

## 2021-03-14 NOTE — Progress Notes (Signed)
[name] is a 60 y.o. female who presents to [facility] clinic today with complaint of right axillary lump and pain. Patient states the right axillary pain is constant. Patient rates the pain at a 8 out of 10 x 5 years..    Pap Smear: Pap smear not completed today. Last Pap smear was 03/08/2002 and normal. Per patient has no history of an abnormal Pap smear. Patient has a history of a hysterectomy 04/13/2002 due to uterine fibroids. Patient doesn't need any further Pap smears due to her history of a hysterectomy for benign reasons per BCCCP and ASCCP guidelines. Last Pap smear result is available in Epic.   Physical exam: Breasts Breasts symmetrical. No skin abnormalities bilateral breasts. No nipple retraction bilateral breasts. No nipple discharge bilateral breasts. No lymphadenopathy. No lumps palpated bilateral breasts. Unable to palpate a lump in patients area of concern within the right axilla. Patient has a lump on her right shoulder. Patient advised to follow-up with PCP on right shoulder lump. Complaints of diffuse left breast pain on exam. Complaints of right axillary and outer breast pain on exam.     MS DIGITAL SCREENING TOMO BILATERAL  Result Date: 09/29/2018 CLINICAL DATA:  Screening. EXAM: DIGITAL SCREENING BILATERAL MAMMOGRAM WITH TOMO AND CAD COMPARISON:  Previous exam(s). ACR Breast Density Category c: The breast tissue is heterogeneously dense, which may obscure small masses. FINDINGS: There are no findings suspicious for malignancy. Images were processed with CAD. IMPRESSION: No mammographic evidence of malignancy. A result letter of this screening mammogram will be mailed directly to the patient. RECOMMENDATION: Screening mammogram in one year. (Code:SM-B-01Y) BI-RADS CATEGORY  1: Negative. Electronically Signed   By: Ammie Ferrier M.D.   On: 09/29/2018 14:48    Pelvic/Bimanual Pap is not indicated today per BCCCP guidelines.   Smoking History: Patient is a current  smoker. Discussed smoking cessation with patient. Referred to the [facility] Quitline and gave resources to the free smoking cessation classes at [facility].   Patient Navigation: Patient education provided. Access to services provided for patient through [facility] program. Transportation provided to [employer] appointment and home. Transportation scheduled for appointment at the [facility] on 03/26/2021. Patient given bag of groceries from the food market at the [facility] for [employer].  Colorectal Cancer Screening: Per patient has never had colonoscopy completed. No complaints today.    Breast and Cervical Cancer Risk Assessment: Patient has a family history of a paternal aunt having breast cancer. Patient has no known genetic mutations or history of radiation treatment to the chest before age 75. Patient has no history of cervical dysplasia, immunocompromised, or DES exposure in-utero.  Risk Assessment     Risk Scores       03/14/2021 09/28/2018   Last edited by: Royston Bake, CMA Rolena Infante H, LPN   5-year risk: 1.4 % 1.4 %   Lifetime risk: 7.5 % 7.8 %            A: BCCCP exam without pap smear Complaint of right axillary lump and pain.  P: Referred patient to the [facility] for a diagnostic mammogram. Appointment scheduled Tuesday, March 26, 2021 at 1400.  Loletta Parish, RN 03/14/2021 12:35 PM

## 2021-03-25 ENCOUNTER — Telehealth: Payer: Self-pay

## 2021-03-25 NOTE — Telephone Encounter (Signed)
Patient name and DOB has been verified Patient was informed of lab results. Patient had no questions.  

## 2021-03-25 NOTE — Telephone Encounter (Signed)
-----   Message from Charlott Rakes, MD sent at 03/10/2021  3:58 PM EDT ----- H.pylori test is negative, blood work is stable.

## 2021-03-26 ENCOUNTER — Inpatient Hospital Stay: Admission: RE | Admit: 2021-03-26 | Payer: Self-pay | Source: Ambulatory Visit

## 2021-03-27 ENCOUNTER — Emergency Department (HOSPITAL_COMMUNITY): Admission: EM | Admit: 2021-03-27 | Discharge: 2021-03-27 | Discharge status: Against Medical Advice | Payer: Self-pay

## 2021-03-27 NOTE — ED Triage Notes (Signed)
Pt arrives via EMS from home called out for chest palpitations and SOB that began last night. Pt reports symptoms worsening when lying down.  No distress noted. Lungs CTA, VSS, EKG unremarkable.

## 2021-03-27 NOTE — ED Notes (Signed)
Pt walked out of the room, stating "I'm going somewhere where I can get some help." As she tried to tell me something, but I was in the middle of having a conversation with the PA. Pt seen leaving department in no distress and refused to be seen further,

## 2021-04-03 ENCOUNTER — Other Ambulatory Visit: Payer: Self-pay

## 2021-04-05 ENCOUNTER — Other Ambulatory Visit: Payer: Self-pay

## 2021-04-12 ENCOUNTER — Ambulatory Visit
Admission: RE | Admit: 2021-04-12 | Discharge: 2021-04-12 | Disposition: A | Discharge status: Home / Self Care | Payer: No Typology Code available for payment source | Source: Ambulatory Visit | Attending: Obstetrics and Gynecology | Admitting: Obstetrics and Gynecology

## 2021-04-12 ENCOUNTER — Ambulatory Visit
Admission: RE | Admit: 2021-04-12 | Discharge: 2021-04-12 | Disposition: A | Discharge status: Home / Self Care | Payer: Self-pay | Source: Ambulatory Visit | Attending: Obstetrics and Gynecology | Admitting: Obstetrics and Gynecology

## 2021-04-12 ENCOUNTER — Other Ambulatory Visit: Payer: Self-pay

## 2021-04-12 DIAGNOSIS — N644 Mastodynia: Secondary | ICD-10-CM

## 2021-04-22 ENCOUNTER — Other Ambulatory Visit: Payer: Self-pay

## 2021-05-13 ENCOUNTER — Other Ambulatory Visit: Payer: Self-pay

## 2021-05-15 ENCOUNTER — Other Ambulatory Visit: Payer: Self-pay

## 2021-05-22 ENCOUNTER — Ambulatory Visit: Payer: No Typology Code available for payment source | Admitting: Physician Assistant

## 2021-06-03 ENCOUNTER — Ambulatory Visit: Payer: Self-pay | Attending: Family Medicine | Admitting: Family Medicine

## 2021-06-03 ENCOUNTER — Encounter: Payer: Self-pay | Admitting: Family Medicine

## 2021-06-03 ENCOUNTER — Other Ambulatory Visit: Payer: Self-pay

## 2021-06-03 VITALS — BP 126/82 | HR 59 | Ht 64.0 in | Wt 199.0 lb

## 2021-06-03 DIAGNOSIS — K0889 Other specified disorders of teeth and supporting structures: Secondary | ICD-10-CM

## 2021-06-03 DIAGNOSIS — I10 Essential (primary) hypertension: Secondary | ICD-10-CM

## 2021-06-03 DIAGNOSIS — K2950 Unspecified chronic gastritis without bleeding: Secondary | ICD-10-CM

## 2021-06-03 DIAGNOSIS — R1112 Projectile vomiting: Secondary | ICD-10-CM

## 2021-06-03 DIAGNOSIS — Z1211 Encounter for screening for malignant neoplasm of colon: Secondary | ICD-10-CM

## 2021-06-03 MED ORDER — OMEPRAZOLE 40 MG PO CPDR
40.0000 mg | DELAYED_RELEASE_CAPSULE | Freq: Every day | ORAL | 6 refills | Status: DC
Start: 2021-06-03 — End: 2021-08-21
  Filled 2021-06-03: qty 30, 30d supply, fill #0

## 2021-06-03 MED ORDER — AMOXICILLIN 500 MG PO CAPS
500.0000 mg | ORAL_CAPSULE | Freq: Three times a day (TID) | ORAL | 0 refills | Status: DC
  Filled 2021-06-03: qty 30, 10d supply, fill #0

## 2021-06-03 MED ORDER — ONDANSETRON HCL 4 MG PO TABS
4.0000 mg | ORAL_TABLET | Freq: Three times a day (TID) | ORAL | 1 refills | Status: DC | PRN
  Filled 2021-06-03: qty 30, 10d supply, fill #0

## 2021-06-03 MED ORDER — ONDANSETRON HCL 4 MG PO TABS
4.0000 mg | ORAL_TABLET | Freq: Three times a day (TID) | ORAL | 1 refills | Status: DC | PRN
Start: 2021-06-03 — End: 2021-06-03

## 2021-06-03 NOTE — Progress Notes (Signed)
Has been taking 2 tabs daily of each BP medication.  States she throws up everyday.  Having dental pain.

## 2021-06-03 NOTE — Progress Notes (Signed)
Subjective:  [name], female    DOB: [birth year]  Age: 60 y.o. MRN: [id number]  CC: Hypertension (/)   HPI Jennifer [name] is a 60 y.o. year old female with a history of hypertension, homelessness here for chronic disease management.  Interval History: Last week she developed R mandibular lymhadenopathy, R ear ache and then her gum and tooth started hurting.  States she was seen by dentist and informed she would need to pay over $100 out-of-pocket for tooth extraction and she does not have this amount.  She states at home her BP has been elevated to about 150/116 and she has been taking all her three antihypertensive medications twice daily rather than daily. This morning she is yet to take her antihypertensives but her BP is controlled at 126/82.  She contnues to have chronic nausea and vomiting which occurs first thing in the morning.  H. pylori breath test from 02/2021 was negative.  We had discussed referral to GI however she does not have medical coverage and is yet to apply for the [place] financial discount to facilitate this referral. She denies blurry vision but does have intermittent headaches. Past Medical History:  Diagnosis Date   bipolar    Bipolar 1 disorder ([place])    Depression    Fibroids    now s/p hysterectomy   Hypertension    Left ventricular hypertrophy    Polysubstance abuse ([place])    IV drug [place], cocaine on occassion, smoking and alcoholism, has been going to AA, not completely sober    Past Surgical History:  Procedure Laterality Date   ABDOMINAL HYSTERECTOMY     2003   BILATERAL SALPINGOOPHORECTOMY     01/2010   DILATION AND CURETTAGE OF UTERUS     EXPLORATORY LAPAROTOMY     complex pelvic mass 2011   TONSILLECTOMY      Family History  Problem Relation Age of Onset   Hypertension Mother    Hypertension Sister    Diabetes Father     No Known Allergies  Outpatient Medications Prior to Visit  Medication Sig Dispense  Refill   amLODipine (NORVASC) 10 MG tablet TAKE 1 TABLET (10 MG TOTAL) BY MOUTH DAILY. 30 tablet 6   lisinopril-hydrochlorothiazide (ZESTORETIC) 20-12.5 MG tablet TAKE 1 TABLET BY MOUTH DAILY. 30 tablet 6   metoprolol succinate (TOPROL-XL) 50 MG 24 hr tablet TAKE 1 TABLET (50 MG TOTAL) BY MOUTH DAILY. 30 tablet 6   gabapentin (NEURONTIN) 300 MG capsule Take 1 capsule (300 mg total) by mouth 2 (two) times daily. (Patient not taking: No sig reported) 60 capsule 3   ibuprofen (ADVIL) 800 MG tablet Take 1 tablet (800 mg total) by mouth 3 (three) times daily. (Patient not taking: No sig reported) 21 tablet 0   oxyCODONE-acetaminophen (PERCOCET/ROXICET) 5-325 MG tablet Take 1 tablet by mouth every 6 (six) hours as needed for severe pain. (Patient not taking: Reported on 06/03/2021) 6 tablet 0   amoxicillin (AMOXIL) 500 MG capsule Take 1 capsule (500 mg total) by mouth 3 (three) times daily. (Patient not taking: No sig reported) 21 capsule 0   omeprazole (PRILOSEC) 40 MG capsule Take 1 capsule (40 mg total) by mouth daily. (Patient not taking: No sig reported) 30 capsule 6   ondansetron (ZOFRAN) 4 MG tablet Take 1 tablet (4 mg total) by mouth every 8 (eight) hours as needed for nausea or vomiting. (Patient not taking: No sig reported) 4 tablet 0   No facility-administered  medications prior to visit.     ROS Review of Systems  Constitutional:  Negative for activity change, appetite change and fatigue.  HENT:  Positive for dental problem. Negative for congestion, sinus pressure and sore throat.   Eyes:  Negative for visual disturbance.  Respiratory:  Negative for cough, chest tightness, shortness of breath and wheezing.   Cardiovascular:  Negative for chest pain and palpitations.  Gastrointestinal:  Positive for vomiting. Negative for abdominal distention, abdominal pain and constipation.  Endocrine: Negative for polydipsia.  Genitourinary:  Negative for dysuria and frequency.  Musculoskeletal:   Negative for arthralgias and back pain.  Skin:  Negative for rash.  Neurological:  Positive for headaches. Negative for tremors, light-headedness and numbness.  Hematological:  Does not bruise/bleed easily.  Psychiatric/Behavioral:  Negative for agitation and behavioral problems.    Objective:  BP 126/82   Pulse (!) 59   Ht 5\' 4"  (1.626 m)   Wt 199 lb (90.3 kg)   LMP 12/04/2009   SpO2 100%   BMI 34.16 kg/m   BP/Weight 06/03/2021 03/14/2021 12/26/15  Systolic BP 494 496 759  Diastolic BP 82 82 90  Wt. (Lbs) 199 192.2 185.6  BMI 34.16 32.99 31.86  Some encounter information is confidential and restricted. Go to Review Flowsheets activity to see all data.      Physical Exam Constitutional:      Appearance: She is well-developed.  Cardiovascular:     Rate and Rhythm: Bradycardia present.     Heart sounds: Normal heart sounds. No murmur heard. Pulmonary:     Effort: Pulmonary effort is normal.     Breath sounds: Normal breath sounds. No wheezing or rales.  Chest:     Chest wall: No tenderness.  Abdominal:     General: Bowel sounds are normal. There is no distension.     Palpations: Abdomen is soft. There is no mass.     Tenderness: There is no abdominal tenderness.  Musculoskeletal:        General: Normal range of motion.     Right lower leg: No edema.     Left lower leg: No edema.  Neurological:     Mental Status: She is alert and oriented to person, place, and time.  Psychiatric:        Mood and Affect: Mood normal.    CMP Latest Ref Rng & Units 03/05/2021 03/28/2019 06/16/2018  Glucose 65 - 99 mg/dL 88 91 81  BUN 6 - 24 mg/dL 19 19 16   Creatinine 0.57 - 1.00 mg/dL 0.98 0.86 0.84  Sodium 134 - 144 mmol/L 142 139 138  Potassium 3.5 - 5.2 mmol/L 4.2 4.4 4.2  Chloride 96 - 106 mmol/L 103 106 104  CO2 20 - 29 mmol/L 17(L) 24 26  Calcium 8.7 - 10.2 mg/dL 10.7(H) 9.5 9.9  Total Protein 6.0 - 8.5 g/dL 7.5 - -  Total Bilirubin 0.0 - 1.2 mg/dL 0.4 - -  Alkaline Phos 44 -  121 IU/L 63 - -  AST 0 - 40 IU/L 21 - -  ALT 0 - 32 IU/L 14 - -    Lipid Panel     Component Value Date/Time   CHOL 177 02/06/2009 2025   TRIG 165 (H) 02/06/2009 2025   HDL 45 02/06/2009 2025   CHOLHDL 3.9 Ratio 02/06/2009 2025   VLDL 33 02/06/2009 2025   LDLCALC 99 02/06/2009 2025    CBC    Component Value Date/Time   WBC 4.9 03/28/2019 1205  RBC 4.87 03/28/2019 1205   HGB 14.2 03/28/2019 1205   HCT 44.3 03/28/2019 1205   PLT 207 03/28/2019 1205   MCV 91.0 03/28/2019 1205   MCH 29.2 03/28/2019 1205   MCHC 32.1 03/28/2019 1205   RDW 14.2 03/28/2019 1205   LYMPHSABS 1.9 03/28/2019 1205   MONOABS 0.4 03/28/2019 1205   EOSABS 0.1 03/28/2019 1205   BASOSABS 0.0 03/28/2019 1205    Lab Results  Component Value Date   HGBA1C 5.8 02/06/2009    Assessment & Plan:  1. Other chronic gastritis without hemorrhage This could explain her nausea and vomiting H. pylori is negative She needs an upper endoscopy - omeprazole (PRILOSEC) 40 MG capsule; Take 1 capsule (40 mg total) by mouth daily.  Dispense: 30 capsule; Refill: 6 - Ambulatory referral to Gastroenterology  2. Screening for colon cancer - Fecal occult blood, imunochemical([facility])  3. Projectile vomiting with nausea Given ongoing early morning vomiting, will need to exclude raised intracranial pressure - CT HEAD WO CONTRAST (5MM); Future - ondansetron (ZOFRAN) 4 MG tablet; Take 1 tablet (4 mg total) by mouth every 8 (eight) hours as needed for nausea or vomiting.  Dispense: 30 tablet; Refill: 1 - Basic Metabolic Panel  4. Essential hypertension Controlled today No indication for regimen adjustment despite the fact that she is yet to take her antihypertensive BP is at goal It is possible that her pain could have contributed Counseled on blood pressure goal of less than 130/80, low-sodium, DASH diet, medication compliance, 150 minutes of moderate intensity exercise per week. Discussed medication  compliance, adverse effects.  5. Tooth ache - amoxicillin (AMOXIL) 500 MG capsule; Take 1 capsule (500 mg total) by mouth 3 (three) times daily.  Dispense: 30 capsule; Refill: 0   Advised to obtain paperwork for the [place] discount at the front desk.  Meds ordered this encounter  Medications   omeprazole (PRILOSEC) 40 MG capsule    Sig: Take 1 capsule (40 mg total) by mouth daily.    Dispense:  30 capsule    Refill:  6   DISCONTD: ondansetron (ZOFRAN) 4 MG tablet    Sig: Take 1 tablet (4 mg total) by mouth every 8 (eight) hours as needed for nausea or vomiting.    Dispense:  30 tablet    Refill:  1   ondansetron (ZOFRAN) 4 MG tablet    Sig: Take 1 tablet (4 mg total) by mouth every 8 (eight) hours as needed for nausea or vomiting.    Dispense:  30 tablet    Refill:  1   amoxicillin (AMOXIL) 500 MG capsule    Sig: Take 1 capsule (500 mg total) by mouth 3 (three) times daily.    Dispense:  30 capsule    Refill:  0     Follow-up: Return in about 6 months (around 12/02/2021) for Chronic medical conditions.       Charlott Rakes, MD, FAAFP. [facility] and [place], [place]   06/03/2021, 6:05 PM

## 2021-06-07 ENCOUNTER — Ambulatory Visit (HOSPITAL_COMMUNITY): Payer: Self-pay

## 2021-06-12 ENCOUNTER — Ambulatory Visit: Payer: No Typology Code available for payment source | Admitting: Family Medicine

## 2021-06-18 ENCOUNTER — Ambulatory Visit: Payer: No Typology Code available for payment source | Admitting: Family Medicine

## 2021-07-01 ENCOUNTER — Other Ambulatory Visit: Payer: Self-pay

## 2021-07-01 ENCOUNTER — Ambulatory Visit: Payer: No Typology Code available for payment source | Admitting: Nurse Practitioner

## 2021-07-03 ENCOUNTER — Other Ambulatory Visit: Payer: Self-pay

## 2021-07-11 ENCOUNTER — Ambulatory Visit: Payer: No Typology Code available for payment source | Attending: Family Medicine | Admitting: Family Medicine

## 2021-07-24 ENCOUNTER — Other Ambulatory Visit: Payer: Self-pay

## 2021-07-24 ENCOUNTER — Ambulatory Visit: Payer: Self-pay | Attending: Family Medicine

## 2021-07-24 DIAGNOSIS — Z23 Encounter for immunization: Secondary | ICD-10-CM

## 2021-07-31 ENCOUNTER — Ambulatory Visit (HOSPITAL_COMMUNITY)
Admission: RE | Admit: 2021-07-31 | Discharge: 2021-07-31 | Disposition: A | Discharge status: Home / Self Care | Payer: Self-pay | Source: Ambulatory Visit | Attending: Family Medicine | Admitting: Family Medicine

## 2021-07-31 DIAGNOSIS — R1112 Projectile vomiting: Secondary | ICD-10-CM | POA: Insufficient documentation

## 2021-08-01 ENCOUNTER — Telehealth: Payer: Self-pay

## 2021-08-01 NOTE — Telephone Encounter (Signed)
-----   Message from Charlott Rakes, MD sent at 07/31/2021  5:05 PM EST ----- Please inform her that CT scan does not reveal any acute abnormality to explain her vomiting.  She does have findings of chronic sinusitis.  My recommendation would be to keep her appointment with GI to evaluate her chronic nausea and vomiting.  Thanks.

## 2021-08-01 NOTE — Telephone Encounter (Signed)
Patient was called and a voicemail was left informing patient to return phone call for lab results.   CRM created

## 2021-08-02 ENCOUNTER — Telehealth: Payer: Self-pay

## 2021-08-02 NOTE — Telephone Encounter (Signed)
Pt was called and no VM is set up to leave a message.

## 2021-08-02 NOTE — Telephone Encounter (Signed)
-----   Message from Charlott Rakes, MD sent at 07/31/2021  5:05 PM EST ----- Please inform her that CT scan does not reveal any acute abnormality to explain her vomiting.  She does have findings of chronic sinusitis.  My recommendation would be to keep her appointment with GI to evaluate her chronic nausea and vomiting.  Thanks.

## 2021-08-14 ENCOUNTER — Other Ambulatory Visit: Payer: Self-pay

## 2021-08-20 ENCOUNTER — Emergency Department (HOSPITAL_COMMUNITY)
Admission: EM | Admit: 2021-08-20 | Discharge: 2021-08-20 | Disposition: A | Discharge status: Home / Self Care | Payer: No Typology Code available for payment source | Attending: Emergency Medicine | Admitting: Emergency Medicine

## 2021-08-20 ENCOUNTER — Other Ambulatory Visit: Payer: Self-pay

## 2021-08-20 DIAGNOSIS — K0889 Other specified disorders of teeth and supporting structures: Secondary | ICD-10-CM | POA: Insufficient documentation

## 2021-08-20 DIAGNOSIS — Z5321 Procedure and treatment not carried out due to patient leaving prior to being seen by health care provider: Secondary | ICD-10-CM | POA: Insufficient documentation

## 2021-08-20 DIAGNOSIS — I10 Essential (primary) hypertension: Secondary | ICD-10-CM | POA: Insufficient documentation

## 2021-08-20 NOTE — ED Triage Notes (Signed)
Pt. Stated, Im out of my BP medication.

## 2021-08-20 NOTE — ED Notes (Signed)
Called for pt # times. No answer

## 2021-08-21 ENCOUNTER — Ambulatory Visit: Payer: Self-pay | Admitting: Physician Assistant

## 2021-08-21 ENCOUNTER — Other Ambulatory Visit: Payer: Self-pay

## 2021-08-21 VITALS — BP 169/100 | HR 66 | Temp 98.2°F | Resp 18 | Ht 64.0 in | Wt 200.0 lb

## 2021-08-21 DIAGNOSIS — I10 Essential (primary) hypertension: Secondary | ICD-10-CM | POA: Insufficient documentation

## 2021-08-21 DIAGNOSIS — F1020 Alcohol dependence, uncomplicated: Secondary | ICD-10-CM

## 2021-08-21 DIAGNOSIS — Z6834 Body mass index (BMI) 34.0-34.9, adult: Secondary | ICD-10-CM

## 2021-08-21 DIAGNOSIS — K219 Gastro-esophageal reflux disease without esophagitis: Secondary | ICD-10-CM

## 2021-08-21 DIAGNOSIS — K0889 Other specified disorders of teeth and supporting structures: Secondary | ICD-10-CM

## 2021-08-21 DIAGNOSIS — F3112 Bipolar disorder, current episode manic without psychotic features, moderate: Secondary | ICD-10-CM

## 2021-08-21 DIAGNOSIS — N951 Menopausal and female climacteric states: Secondary | ICD-10-CM

## 2021-08-21 DIAGNOSIS — E6609 Other obesity due to excess calories: Secondary | ICD-10-CM

## 2021-08-21 MED ORDER — HYDROXYZINE HCL 25 MG PO TABS
25.0000 mg | ORAL_TABLET | Freq: Every evening | ORAL | 1 refills | Status: DC | PRN
  Filled 2021-08-21: qty 30, 30d supply, fill #0
  Filled 2021-10-08: qty 30, 30d supply, fill #1

## 2021-08-21 MED ORDER — GABAPENTIN 300 MG PO CAPS
300.0000 mg | ORAL_CAPSULE | Freq: Every day | ORAL | 1 refills | Status: DC
  Filled 2021-08-21: qty 30, 30d supply, fill #0
  Filled 2021-10-08: qty 30, 30d supply, fill #1

## 2021-08-21 MED ORDER — AMOXICILLIN 500 MG PO CAPS
500.0000 mg | ORAL_CAPSULE | Freq: Three times a day (TID) | ORAL | 0 refills | Status: DC
  Filled 2021-08-21: qty 30, 10d supply, fill #0

## 2021-08-21 MED ORDER — AMLODIPINE BESYLATE 10 MG PO TABS
ORAL_TABLET | Freq: Every day | ORAL | 6 refills | Status: DC
  Filled 2021-08-21: qty 30, 30d supply, fill #0
  Filled 2021-09-10: qty 30, 30d supply, fill #1
  Filled 2021-10-08: qty 30, 30d supply, fill #2
  Filled 2021-10-31: qty 30, 30d supply, fill #3
  Filled 2021-11-25: qty 30, 30d supply, fill #4
  Filled 2021-12-23: qty 30, 30d supply, fill #5
  Filled 2022-01-23: qty 30, 30d supply, fill #6

## 2021-08-21 MED ORDER — METOPROLOL SUCCINATE ER 50 MG PO TB24
ORAL_TABLET | Freq: Every day | ORAL | 6 refills | Status: DC
  Filled 2021-08-21: qty 30, 30d supply, fill #0
  Filled 2021-09-10: qty 30, 30d supply, fill #1
  Filled 2021-10-08: qty 30, 30d supply, fill #2
  Filled 2021-10-31 (×2): qty 30, 30d supply, fill #3
  Filled 2021-11-25: qty 30, 30d supply, fill #4
  Filled 2021-12-23: qty 30, 30d supply, fill #5
  Filled 2022-01-23: qty 30, 30d supply, fill #6

## 2021-08-21 MED ORDER — PANTOPRAZOLE SODIUM 40 MG PO TBEC
40.0000 mg | DELAYED_RELEASE_TABLET | Freq: Every day | ORAL | 3 refills | Status: DC
  Filled 2021-08-21: qty 30, 30d supply, fill #0

## 2021-08-21 MED ORDER — LISINOPRIL-HYDROCHLOROTHIAZIDE 20-12.5 MG PO TABS
2.0000 | ORAL_TABLET | Freq: Every day | ORAL | 1 refills | Status: DC
  Filled 2021-08-21: qty 60, 30d supply, fill #0
  Filled 2021-09-10: qty 60, 30d supply, fill #1

## 2021-08-21 NOTE — Progress Notes (Signed)
Established Patient Office Visit  Subjective:  [name], female    DOB: June 10, 1961  Age: 61 y.o. MRN: [id number]  CC:  Chief Complaint  Patient presents with   Medication Refill    HTN    HPI [name] presents for medication refills, states that she has been out of her blood pressure medication for the last 6 days.  States that she does check her blood pressure at times, mostly if she is at [facility] or at [employer] and if it is elevated she will "double up" on her blood pressure medications that day.  States that she also can tell if her blood pressure is elevated, states that she gets a headache in the afternoon and she will double up on her medications at that time as well without checking her blood pressure at home.  States that she continues to have dental pain, states that she has been unable to see dentistry due to financial constraints.  States that she has had swelling and difficulty chewing on her left side.  States that she has been having difficulty sleeping, states that she has been experiencing hot flushes.  It does appear that she previously was taking gabapentin to help with her mood disorder and menopausal symptoms.  States that she has been using p.m. over-the-counter products to help her with sleep.  States that she does check her BP at home and states that has been having elevated readings similar to today.   Past Medical History:  Diagnosis Date   bipolar    Bipolar 1 disorder ([place])    Depression    Fibroids    now s/p hysterectomy   Hypertension    Left ventricular hypertrophy    Polysubstance abuse ([place])    IV drug [place], cocaine on occassion, smoking and alcoholism, has been going to AA, not completely sober    Past Surgical History:  Procedure Laterality Date   ABDOMINAL HYSTERECTOMY     2003   BILATERAL SALPINGOOPHORECTOMY     01/2010   DILATION AND CURETTAGE OF UTERUS     EXPLORATORY LAPAROTOMY     complex pelvic mass  2011   TONSILLECTOMY      Family History  Problem Relation Age of Onset   Hypertension Mother    Hypertension Sister    Diabetes Father     Social History   Socioeconomic History   Marital status: Single    Spouse name: Not on file   Number of children: 0   Years of education: Not on file   Highest education level: Some college, no degree  Occupational History   Not on file  Tobacco Use   Smoking status: Every Day    Packs/day: 1.00    Years: 20.00    Pack years: 20.00    Types: Cigarettes   Smokeless tobacco: Never   Tobacco comments:    Wants to quit.   Vaping Use   Vaping Use: Never used  Substance and Sexual Activity   Alcohol use: Yes    Comment: occ.   Drug use: Not Currently    Types: Cocaine, Marijuana   Sexual activity: Not Currently    Birth control/protection: Surgical  Other Topics Concern   Not on file  Social History Narrative   Financial assistance approved for 100% discount at [facility] and has [facility] card per Bonna Gains   07/19/2010         Social Determinants of Health   Financial Resource  Strain: Not on file  Food Insecurity: Food Insecurity Present   Worried About Charity fundraiser in the Last Year: Often true   Arboriculturist in the Last Year: Often true  Transportation Needs: Public librarian (Medical): Yes   Lack of Transportation (Non-Medical): Yes  Physical Activity: Not on file  Stress: Not on file  Social Connections: Not on file  Intimate Partner Violence: Not on file    Outpatient Medications Prior to Visit  Medication Sig Dispense Refill   ondansetron (ZOFRAN) 4 MG tablet Take 1 tablet (4 mg total) by mouth every 8 (eight) hours as needed for nausea or vomiting. 30 tablet 1   amLODipine (NORVASC) 10 MG tablet TAKE 1 TABLET (10 MG TOTAL) BY MOUTH DAILY. 30 tablet 6   lisinopril-hydrochlorothiazide (ZESTORETIC) 20-12.5 MG tablet TAKE 1 TABLET BY MOUTH DAILY. 30 tablet 6   metoprolol  succinate (TOPROL-XL) 50 MG 24 hr tablet TAKE 1 TABLET (50 MG TOTAL) BY MOUTH DAILY. 30 tablet 6   omeprazole (PRILOSEC) 40 MG capsule Take 1 capsule (40 mg total) by mouth daily. 30 capsule 6   ibuprofen (ADVIL) 800 MG tablet Take 1 tablet (800 mg total) by mouth 3 (three) times daily. (Patient not taking: Reported on 03/14/2021) 21 tablet 0   oxyCODONE-acetaminophen (PERCOCET/ROXICET) 5-325 MG tablet Take 1 tablet by mouth every 6 (six) hours as needed for severe pain. (Patient not taking: Reported on 06/03/2021) 6 tablet 0   amoxicillin (AMOXIL) 500 MG capsule Take 1 capsule (500 mg total) by mouth 3 (three) times daily. 30 capsule 0   gabapentin (NEURONTIN) 300 MG capsule Take 1 capsule (300 mg total) by mouth 2 (two) times daily. (Patient not taking: Reported on 03/14/2021) 60 capsule 3   No facility-administered medications prior to visit.    No Known Allergies  ROS Review of Systems  Constitutional: Negative.   HENT: Negative.    Eyes: Negative.   Respiratory:  Negative for shortness of breath.   Cardiovascular:  Negative for chest pain.  Gastrointestinal:  Positive for nausea and vomiting.  Endocrine: Negative.   Genitourinary: Negative.   Musculoskeletal: Negative.   Skin: Negative.   Allergic/Immunologic: Negative.   Neurological: Negative.   Hematological: Negative.   Psychiatric/Behavioral: Negative.       Objective:    Physical Exam Vitals and nursing note reviewed.  Constitutional:      Appearance: Normal appearance.  HENT:     Head: Normocephalic and atraumatic.     Right Ear: External ear normal.     Left Ear: External ear normal.     Nose: Nose normal.     Mouth/Throat:     Mouth: Mucous membranes are moist.     Pharynx: Oropharynx is clear.  Eyes:     General: Scleral icterus present.  Cardiovascular:     Rate and Rhythm: Normal rate and regular rhythm.     Pulses: Normal pulses.     Heart sounds: Normal heart sounds.  Pulmonary:     Effort: Pulmonary  effort is normal.     Breath sounds: Normal breath sounds.  Musculoskeletal:        General: Normal range of motion.     Cervical back: Normal range of motion and neck supple.  Skin:    General: Skin is warm and dry.  Neurological:     General: No focal deficit present.     Mental Status: She is alert and oriented to person,  place, and time.  Psychiatric:        Mood and Affect: Mood normal.        Behavior: Behavior normal.        Thought Content: Thought content normal.        Judgment: Judgment normal.    BP (!) 169/100 (BP Location: Left Arm, Patient Position: Sitting, Cuff Size: Normal)    Pulse 66    Temp 98.2 F (36.8 C) (Oral)    Resp 18    Ht 5' 4"  (1.626 m)    Wt 200 lb (90.7 kg)    LMP 12/04/2009    SpO2 100%    BMI 34.33 kg/m  Wt Readings from Last 3 Encounters:  08/21/21 200 lb (90.7 kg)  06/03/21 199 lb (90.3 kg)  03/14/21 192 lb 3.2 oz (87.2 kg)     Health Maintenance Due  Topic Date Due   COVID-19 Vaccine (1) Never done   Pneumococcal Vaccine 12-62 Years old (1 - PCV) Never done   COLONOSCOPY (Pts 45-39yr Insurance coverage will need to be confirmed)  Never done   Zoster Vaccines- Shingrix (1 of 2) Never done   TETANUS/TDAP  02/07/2019    There are no preventive care reminders to display for this patient.  Lab Results  Component Value Date   TSH 1.177 02/13/2015   Lab Results  Component Value Date   WBC 4.9 03/28/2019   HGB 14.2 03/28/2019   HCT 44.3 03/28/2019   MCV 91.0 03/28/2019   PLT 207 03/28/2019   Lab Results  Component Value Date   NA 142 03/05/2021   K 4.2 03/05/2021   CO2 17 (L) 03/05/2021   GLUCOSE 88 03/05/2021   BUN 19 03/05/2021   CREATININE 0.98 03/05/2021   BILITOT 0.4 03/05/2021   ALKPHOS 63 03/05/2021   AST 21 03/05/2021   ALT 14 03/05/2021   PROT 7.5 03/05/2021   ALBUMIN 4.6 03/05/2021   CALCIUM 10.7 (H) 03/05/2021   ANIONGAP 9 03/28/2019   EGFR 67 03/05/2021   Lab Results  Component Value Date   CHOL 177  02/06/2009   Lab Results  Component Value Date   HDL 45 02/06/2009   Lab Results  Component Value Date   LDLCALC 99 02/06/2009   Lab Results  Component Value Date   TRIG 165 (H) 02/06/2009   Lab Results  Component Value Date   CHOLHDL 3.9 Ratio 02/06/2009   Lab Results  Component Value Date   HGBA1C 5.8 02/06/2009      Assessment & Plan:   Problem List Items Addressed This Visit       Cardiovascular and Mediastinum   Essential hypertension - Primary   Relevant Medications   amLODipine (NORVASC) 10 MG tablet   lisinopril-hydrochlorothiazide (ZESTORETIC) 20-12.5 MG tablet   metoprolol succinate (TOPROL-XL) 50 MG 24 hr tablet   Other Relevant Orders   CBC with Differential/Platelet   Comp. Metabolic Panel (12)   TSH     Digestive   GERD (gastroesophageal reflux disease) (Chronic)   Relevant Medications   pantoprazole (PROTONIX) 40 MG tablet     Other   Bipolar disorder, current episode manic without psychotic features, moderate (HCC) (Chronic)   Relevant Medications   hydrOXYzine (ATARAX) 25 MG tablet   gabapentin (NEURONTIN) 300 MG capsule   Other Relevant Orders   Vitamin D, 25-hydroxy   Menopausal symptoms   Relevant Medications   gabapentin (NEURONTIN) 300 MG capsule   Tooth ache   Relevant Medications  amoxicillin (AMOXIL) 500 MG capsule   Alcohol use disorder, moderate, dependence (HCC)   Class 1 obesity due to excess calories with serious comorbidity and body mass index (BMI) of 34.0 to 34.9 in adult    Meds ordered this encounter  Medications   amLODipine (NORVASC) 10 MG tablet    Sig: TAKE 1 TABLET (10 MG TOTAL) BY MOUTH DAILY.    Dispense:  30 tablet    Refill:  6    Order Specific Question:   Supervising Provider    Answer:   Elsie Stain [[id number]]   lisinopril-hydrochlorothiazide (ZESTORETIC) 20-12.5 MG tablet    Sig: Take 2 tablets by mouth daily.    Dispense:  60 tablet    Refill:  1    Change in dosing    Order Specific  Question:   Supervising Provider    Answer:   Elsie Stain [[id number]]   metoprolol succinate (TOPROL-XL) 50 MG 24 hr tablet    Sig: TAKE 1 TABLET (50 MG TOTAL) BY MOUTH DAILY.    Dispense:  30 tablet    Refill:  6    Order Specific Question:   Supervising Provider    Answer:   Asencion Noble E [[id number]]   hydrOXYzine (ATARAX) 25 MG tablet    Sig: Take 1 tablet (25 mg total) by mouth at bedtime as needed.    Dispense:  30 tablet    Refill:  1    Order Specific Question:   Supervising Provider    Answer:   Asencion Noble E [[id number]]   gabapentin (NEURONTIN) 300 MG capsule    Sig: Take 1 capsule (300 mg total) by mouth at bedtime.    Dispense:  30 capsule    Refill:  1    Order Specific Question:   Supervising Provider    Answer:   Asencion Noble E [[id number]]   pantoprazole (PROTONIX) 40 MG tablet    Sig: Take 1 tablet (40 mg total) by mouth daily.    Dispense:  30 tablet    Refill:  3    Order Specific Question:   Supervising Provider    Answer:   Asencion Noble E [[id number]]   amoxicillin (AMOXIL) 500 MG capsule    Sig: Take 1 capsule (500 mg total) by mouth 3 (three) times daily.    Dispense:  30 capsule    Refill:  0    Order Specific Question:   Supervising Provider    Answer:   WRIGHT, PATRICK E [[id number]]  1. Essential hypertension Increase lisinopril hydrochlorothiazide 40-25 mg.  Patient education given on checking blood pressure on a daily basis, keeping a written log and having available for all office visits.  Patient education given on low-sodium diet.  Patient given an appointment to follow-up with clinic pharmacist in 1 month.  Patient given appointment to follow-up with primary care provider in 2 months.  Red flags given for prompt reevaluation - amLODipine (NORVASC) 10 MG tablet; TAKE 1 TABLET (10 MG TOTAL) BY MOUTH DAILY.  Dispense: 30 tablet; Refill: 6 - lisinopril-hydrochlorothiazide (ZESTORETIC) 20-12.5 MG tablet; Take 2 tablets by mouth daily.  Dispense: 60 tablet; Refill:  1 - metoprolol succinate (TOPROL-XL) 50 MG 24 hr tablet; TAKE 1 TABLET (50 MG TOTAL) BY MOUTH DAILY.  Dispense: 30 tablet; Refill: 6 - CBC with Differential/Platelet - Comp. Metabolic Panel (12) - TSH  2. Menopausal symptoms Restart gabapentin, will restart at lower dose - gabapentin (NEURONTIN) 300 MG capsule; Take 1 capsule (300 mg total)  by mouth at bedtime.  Dispense: 30 capsule; Refill: 1  3. Tooth ache Trial amoxicillin.  Patient encouraged to continue application for [place] financial assistance to be able to refer to dentistry.  Patient understands and agrees - amoxicillin (AMOXIL) 500 MG capsule; Take 1 capsule (500 mg total) by mouth 3 (three) times daily.  Dispense: 30 capsule; Refill: 0  4. Alcohol use disorder, moderate, dependence ([place])   5. Gastroesophageal reflux disease, unspecified whether esophagitis present Trial Protonix.  Patient education given on lifestyle modifications - pantoprazole (PROTONIX) 40 MG tablet; Take 1 tablet (40 mg total) by mouth daily.  Dispense: 30 tablet; Refill: 3  6. Bipolar disorder, current episode manic without psychotic features, moderate (HCC) Trial hydroxyzine, encouraged patient to discontinue use of over-the-counter PM products. - hydrOXYzine (ATARAX) 25 MG tablet; Take 1 tablet (25 mg total) by mouth at bedtime as needed.  Dispense: 30 tablet; Refill: 1 - gabapentin (NEURONTIN) 300 MG capsule; Take 1 capsule (300 mg total) by mouth at bedtime.  Dispense: 30 capsule; Refill: 1 - Vitamin D, 25-hydroxy  7. Class 1 obesity due to excess calories with serious comorbidity and body mass index (BMI) of 34.0 to 34.9 in adult    I have reviewed the patient's medical history (PMH, PSH, Social History, Family History, Medications, and allergies) , and have been updated if relevant. I spent 30 minutes reviewing chart and  face to face time with patient.     Follow-up: Return in about 30 days (around 09/20/2021) for [facility].    Loraine Grip  [place], PA-C

## 2021-08-21 NOTE — Progress Notes (Signed)
Patient has not eaten today Patient has been out of medication for 6 days. Patient complains of tooth concern as well requesting antibiotic.

## 2021-08-21 NOTE — Patient Instructions (Addendum)
To help improve your blood pressure readings, you are going to start taking 2 tablets of the lisinopril hydrochlorothiazide once daily in addition to your amlodipine and metoprolol.  I do encourage you to check your blood pressure at home on a daily basis, keep a written log and have them available for when you [facility].  To help with your menopausal symptoms and mood, you are going to start taking gabapentin again, you will take 1 at bedtime.  To help with sleep, you can use hydroxyzine at bedtime as well.  To help with your stomach, you will start taking Protonix in the morning.  I also encourage you to work on improving your dietary choices.  We will call you with today's lab results.  Kennieth Rad, PA-C Physician Assistant [place] [email or web address]  How to Take Your Blood Pressure Blood pressure is a measurement of how strongly your blood is pressing against the walls of your arteries. Arteries are blood vessels that carry blood from your heart throughout your body. Your health care provider takes your blood pressure at each office visit. You can also take your own blood pressure at home with a blood pressure monitor. You may need to take your own blood pressure to: Confirm a diagnosis of high blood pressure (hypertension). Monitor your blood pressure over time. Make sure your blood pressure medicine is working. Supplies needed: Blood pressure monitor. Dining room chair to sit in. Table or desk. Small notebook and pencil or pen. How to prepare To get the most accurate reading, avoid the following for 30 minutes before you check your blood pressure: Drinking caffeine. Drinking alcohol. Eating. Smoking. Exercising. Five minutes before you check your blood pressure: Use the bathroom and urinate so that you have an empty bladder. Sit quietly in  a dining room chair. Do not sit in a soft couch or an armchair. Do not talk. How to take your blood pressure To check your blood pressure, follow the instructions in the manual that came with your blood pressure monitor. If you have a digital blood pressure monitor, the instructions may be as follows: Sit up straight in a chair. Place your feet on the floor. Do not cross your ankles or legs. Rest your left arm at the level of your heart on a table or desk or on the arm of a chair. Pull up your shirt sleeve. Wrap the blood pressure cuff around the upper part of your left arm, 1 inch (2.5 cm) above your elbow. It is best to wrap the cuff around bare skin. Fit the cuff snugly around your arm. You should be able to place only one finger between the cuff and your arm. Position the cord so that it rests in the bend of your elbow. Press the power button. Sit quietly while the cuff inflates and deflates. Read the digital reading on the monitor screen and write the numbers down (record them) in a notebook. Wait 2-3 minutes, then repeat the steps, starting at step 1. What does my blood pressure reading mean? A blood pressure reading consists of a higher number over a lower number. Ideally, your blood pressure should be below 120/80. The first ("top") number is called the systolic pressure. It is a measure of the pressure in your arteries as your heart beats. The second ("bottom") number is called the diastolic pressure. It is a measure of the pressure in your arteries as the  heart relaxes. Blood pressure is classified into five stages. The following are the stages for adults who do not have a short-term serious illness or a chronic condition. Systolic pressure and diastolic pressure are measured in a unit called mm Hg (millimeters of mercury).  Normal Systolic pressure: below 093. Diastolic pressure: below 80. Elevated Systolic pressure: 267-124. Diastolic pressure: below 80. Hypertension stage  1 Systolic pressure: 580-998. Diastolic pressure: 33-82. Hypertension stage 2 Systolic pressure: 505 or above. Diastolic pressure: 90 or above. You can have elevated blood pressure or hypertension even if only the systolic or only the diastolic number in your reading is higher than normal. Follow these instructions at home: Medicines Take over-the-counter and prescription medicines only as told by your health care provider. Tell your health care provider if you are having any side effects from blood pressure medicine. General instructions Check your blood pressure as often as recommended by your health care provider. Check your blood pressure at the same time every day. Take your monitor to the next appointment with your health care provider to make sure that: You are using it correctly. It provides accurate readings. Understand what your goal blood pressure numbers are. Keep all follow-up visits as told by your health care provider. This is important. General tips Your health care provider can suggest a reliable monitor that will meet your needs. There are several types of home blood pressure monitors. Choose a monitor that has an arm cuff. Do not choose a monitor that measures your blood pressure from your wrist or finger. Choose a cuff that wraps snugly around your upper arm. You should be able to fit only one finger between your arm and the cuff. You can buy a blood pressure monitor at most drugstores or online. Where to find more information American Heart Association: www.heart.org Contact a health care provider if: Your blood pressure is consistently high. Your blood pressure is suddenly low. Get help right away if: Your systolic blood pressure is higher than 180. Your diastolic blood pressure is higher than 120. Summary Blood pressure is a measurement of how strongly your blood is pressing against the walls of your arteries. A blood pressure reading consists of a higher  number over a lower number. Ideally, your blood pressure should be below 120/80. Check your blood pressure at the same time every day. Avoid caffeine, alcohol, smoking, and exercise for 30 minutes prior to checking your blood pressure. These agents can affect the accuracy of the blood pressure reading. This information is not intended to replace advice given to you by your health care provider. Make sure you discuss any questions you have with your health care provider. Document Revised: 06/06/2020 Document Reviewed: 07/22/2019 Elsevier Patient Education  2022 [employer].

## 2021-08-22 LAB — COMP. METABOLIC PANEL (12)
AST: 22 IU/L (ref 0–40)
Albumin/Globulin Ratio: 1.7 (ref 1.2–2.2)
Albumin: 4.1 g/dL (ref 3.8–4.9)
Alkaline Phosphatase: 60 IU/L (ref 44–121)
BUN/Creatinine Ratio: 22 (ref 9–23)
BUN: 16 mg/dL (ref 6–24)
Bilirubin Total: 0.2 mg/dL (ref 0.0–1.2)
Calcium: 9.5 mg/dL (ref 8.7–10.2)
Chloride: 106 mmol/L (ref 96–106)
Creatinine, Ser: 0.73 mg/dL (ref 0.57–1.00)
Globulin, Total: 2.4 g/dL (ref 1.5–4.5)
Glucose: 89 mg/dL (ref 70–99)
Potassium: 4.4 mmol/L (ref 3.5–5.2)
Sodium: 141 mmol/L (ref 134–144)
Total Protein: 6.5 g/dL (ref 6.0–8.5)
eGFR: 95 mL/min/{1.73_m2} (ref >59)

## 2021-08-22 LAB — CBC WITH DIFFERENTIAL/PLATELET
Basophils Absolute: 0 10*3/uL (ref 0.0–0.2)
Basos: 1 %
EOS (ABSOLUTE): 0.1 10*3/uL (ref 0.0–0.4)
Eos: 2 %
Hematocrit: 44.4 % (ref 34.0–46.6)
Hemoglobin: 14.7 g/dL (ref 11.1–15.9)
Immature Grans (Abs): 0 10*3/uL (ref 0.0–0.1)
Immature Granulocytes: 0 %
Lymphocytes Absolute: 1.3 10*3/uL (ref 0.7–3.1)
Lymphs: 35 %
MCH: 29.4 pg (ref 26.6–33.0)
MCHC: 33.1 g/dL (ref 31.5–35.7)
MCV: 89 fL (ref 79–97)
Monocytes Absolute: 0.2 10*3/uL (ref 0.1–0.9)
Monocytes: 6 %
Neutrophils Absolute: 2.2 10*3/uL (ref 1.4–7.0)
Neutrophils: 56 %
Platelets: 188 10*3/uL (ref 150–450)
RBC: 5 x10E6/uL (ref 3.77–5.28)
RDW: 13.7 % (ref 11.7–15.4)
WBC: 3.8 10*3/uL (ref 3.4–10.8)

## 2021-08-22 LAB — TSH: TSH: 1.05 u[IU]/mL (ref 0.450–4.500)

## 2021-08-22 LAB — VITAMIN D 25 HYDROXY (VIT D DEFICIENCY, FRACTURES): Vit D, 25-Hydroxy: 40.1 ng/mL (ref 30.0–100.0)

## 2021-08-28 ENCOUNTER — Telehealth: Payer: Self-pay | Admitting: *Deleted

## 2021-08-28 NOTE — Telephone Encounter (Signed)
MA LVM with person who answered to ask patient to return a phone call to her PCP office to discuss results.

## 2021-08-30 ENCOUNTER — Other Ambulatory Visit: Payer: Self-pay

## 2021-09-10 ENCOUNTER — Other Ambulatory Visit: Payer: Self-pay

## 2021-09-10 ENCOUNTER — Ambulatory Visit: Payer: Self-pay | Admitting: Physician Assistant

## 2021-09-10 VITALS — BP 123/90 | HR 84 | Temp 98.2°F | Resp 18 | Ht 64.0 in | Wt 202.0 lb

## 2021-09-10 DIAGNOSIS — Z23 Encounter for immunization: Secondary | ICD-10-CM

## 2021-09-10 DIAGNOSIS — I10 Essential (primary) hypertension: Secondary | ICD-10-CM

## 2021-09-10 DIAGNOSIS — J320 Chronic maxillary sinusitis: Secondary | ICD-10-CM

## 2021-09-10 DIAGNOSIS — K047 Periapical abscess without sinus: Secondary | ICD-10-CM

## 2021-09-10 MED ORDER — AMOXICILLIN-POT CLAVULANATE 875-125 MG PO TABS
1.0000 | ORAL_TABLET | Freq: Two times a day (BID) | ORAL | 0 refills | Status: AC
  Filled 2021-09-10: qty 28, 14d supply, fill #0

## 2021-09-10 MED ORDER — CETIRIZINE HCL 10 MG PO TABS
10.0000 mg | ORAL_TABLET | Freq: Every day | ORAL | 11 refills | Status: DC
  Filled 2021-09-10: qty 30, 30d supply, fill #0

## 2021-09-10 NOTE — Progress Notes (Signed)
Patient reports recent nosebleeds from the left nostril. Patient has eaten and taken medication today.

## 2021-09-10 NOTE — Progress Notes (Signed)
Established Patient Office Visit  Subjective:  [name], female    DOB: [birth year]  Age: 61 y.o. MRN: [id number]  CC:  Chief Complaint  Patient presents with   Dental Pain    HPI Jennifer [name] states that she continues to have right-sided dental pain, states that she has also been having right-sided sinus tenderness and pain, along with bloody noses.  States that she did finish the previous antibiotic prescribed for dental pain with modest relief.  Reports that she has been checking her blood pressure at home on occasion, states she is only able to do so if she is at a store that has a blood pressure monitor.  Reports that if it is elevated she has continued to "double up on her medication" despite recognizing that her medication dosing was increased at her last office visit.  Has not been able to follow-up with dentistry due to financial constraints.   Has been doubling up with  Did improve  Taking aleve for the pain       Past Medical History:  Diagnosis Date   bipolar    Bipolar 1 disorder ([place])    Depression    Fibroids    now s/p hysterectomy   Hypertension    Left ventricular hypertrophy    Polysubstance abuse ([place])    IV drug [place], cocaine on occassion, smoking and alcoholism, has been going to AA, not completely sober    Past Surgical History:  Procedure Laterality Date   ABDOMINAL HYSTERECTOMY     2003   BILATERAL SALPINGOOPHORECTOMY     01/2010   DILATION AND CURETTAGE OF UTERUS     EXPLORATORY LAPAROTOMY     complex pelvic mass 2011   TONSILLECTOMY      Family History  Problem Relation Age of Onset   Hypertension Mother    Hypertension Sister    Diabetes Father     Social History   Socioeconomic History   Marital status: Single    Spouse name: Not on file   Number of children: 0   Years of education: Not on file   Highest education level: Some college, no degree  Occupational History   Not on file  Tobacco Use    Smoking status: Every Day    Packs/day: 1.00    Years: 20.00    Pack years: 20.00    Types: Cigarettes   Smokeless tobacco: Never   Tobacco comments:    Wants to quit.   Vaping Use   Vaping Use: Never used  Substance and Sexual Activity   Alcohol use: Yes    Comment: occ.   Drug use: Not Currently    Types: Cocaine, Marijuana   Sexual activity: Not Currently    Birth control/protection: Surgical  Other Topics Concern   Not on file  Social History Narrative   Financial assistance approved for 100% discount at [facility] and has [facility] card per Bonna Gains   07/19/2010         Social Determinants of Health   Financial Resource Strain: Not on file  Food Insecurity: Food Insecurity Present   Worried About [place] in the Last Year: Often true   Arboriculturist in the Last Year: Often true  Transportation Needs: Unmet Transportation Needs   Lack of Transportation (Medical): Yes   Lack of Transportation (Non-Medical): Yes  Physical Activity: Not on file  Stress: Not on file  Social Connections: Not on file  Intimate Partner Violence: Not on file    Outpatient Medications Prior to Visit  Medication Sig Dispense Refill   amLODipine (NORVASC) 10 MG tablet TAKE 1 TABLET (10 MG TOTAL) BY MOUTH DAILY. 30 tablet 6   gabapentin (NEURONTIN) 300 MG capsule Take 1 capsule (300 mg total) by mouth at bedtime. 30 capsule 1   hydrOXYzine (ATARAX) 25 MG tablet Take 1 tablet (25 mg total) by mouth at bedtime as needed. 30 tablet 1   ibuprofen (ADVIL) 800 MG tablet Take 1 tablet (800 mg total) by mouth 3 (three) times daily. (Patient not taking: Reported on 03/14/2021) 21 tablet 0   lisinopril-hydrochlorothiazide (ZESTORETIC) 20-12.5 MG tablet Take 2 tablets by mouth daily. 60 tablet 1   metoprolol succinate (TOPROL-XL) 50 MG 24 hr tablet TAKE 1 TABLET (50 MG TOTAL) BY MOUTH DAILY. 30 tablet 6   ondansetron (ZOFRAN) 4 MG tablet Take 1 tablet (4 mg total) by mouth every 8 (eight) hours as  needed for nausea or vomiting. 30 tablet 1   oxyCODONE-acetaminophen (PERCOCET/ROXICET) 5-325 MG tablet Take 1 tablet by mouth every 6 (six) hours as needed for severe pain. (Patient not taking: Reported on 06/03/2021) 6 tablet 0   pantoprazole (PROTONIX) 40 MG tablet Take 1 tablet (40 mg total) by mouth daily. 30 tablet 3   amoxicillin (AMOXIL) 500 MG capsule Take 1 capsule (500 mg total) by mouth 3 (three) times daily. 30 capsule 0   No facility-administered medications prior to visit.    No Known Allergies  ROS Review of Systems  Constitutional:  Negative for chills and fever.  HENT:  Positive for dental problem, facial swelling, nosebleeds, sinus pressure and sinus pain. Negative for sore throat and trouble swallowing.   Eyes: Negative.   Respiratory:  Negative for shortness of breath.   Cardiovascular:  Negative for chest pain.  Gastrointestinal: Negative.   Endocrine: Negative.   Genitourinary: Negative.   Musculoskeletal: Negative.   Skin: Negative.   Allergic/Immunologic: Negative.   Neurological:  Negative for headaches.  Hematological: Negative.   Psychiatric/Behavioral: Negative.       Objective:    Physical Exam Vitals and nursing note reviewed.  Constitutional:      Appearance: Normal appearance.  HENT:     Head: Normocephalic and atraumatic.     Right Ear: Tympanic membrane, ear canal and external ear normal.     Left Ear: Tympanic membrane, ear canal and external ear normal.     Nose:     Right Nostril: No foreign body or epistaxis.     Left Nostril: No epistaxis.     Right Turbinates: Swollen.     Left Turbinates: Not swollen.     Right Sinus: Maxillary sinus tenderness and frontal sinus tenderness present.     Left Sinus: No maxillary sinus tenderness or frontal sinus tenderness.     Mouth/Throat:     Mouth: Mucous membranes are moist.     Dentition: Dental tenderness and dental abscesses present.     Pharynx: Oropharynx is clear.     Comments: Upper  left  Eyes:     Extraocular Movements: Extraocular movements intact.     Conjunctiva/sclera:     Right eye: Right conjunctiva is injected. No exudate.    Left eye: Left conjunctiva is injected. No exudate. Cardiovascular:     Rate and Rhythm: Normal rate and regular rhythm.     Pulses: Normal pulses.     Heart sounds: Normal heart sounds.  Pulmonary:  Effort: Pulmonary effort is normal.  Musculoskeletal:        General: Normal range of motion.     Cervical back: Normal range of motion and neck supple.  Skin:    General: Skin is warm and dry.  Neurological:     General: No focal deficit present.     Mental Status: She is alert and oriented to person, place, and time.  Psychiatric:        Mood and Affect: Mood normal.        Behavior: Behavior normal.        Thought Content: Thought content normal.        Judgment: Judgment normal.    BP 123/90    Pulse 84    Temp 98.2 F (36.8 C) (Oral)    Resp 18    Ht 5' 4"  (1.626 m)    Wt 202 lb (91.6 kg)    LMP 12/04/2009    SpO2 100%    BMI 34.67 kg/m  Wt Readings from Last 3 Encounters:  09/10/21 202 lb (91.6 kg)  08/21/21 200 lb (90.7 kg)  06/03/21 199 lb (90.3 kg)     Health Maintenance Due  Topic Date Due   COVID-19 Vaccine (1) Never done   COLONOSCOPY (Pts 45-27yr Insurance coverage will need to be confirmed)  Never done   Zoster Vaccines- Shingrix (1 of 2) Never done   TETANUS/TDAP  02/07/2019    There are no preventive care reminders to display for this patient.  Lab Results  Component Value Date   TSH 1.050 08/21/2021   Lab Results  Component Value Date   WBC 3.8 08/21/2021   HGB 14.7 08/21/2021   HCT 44.4 08/21/2021   MCV 89 08/21/2021   PLT 188 08/21/2021   Lab Results  Component Value Date   NA 141 08/21/2021   K 4.4 08/21/2021   CO2 17 (L) 03/05/2021   GLUCOSE 89 08/21/2021   BUN 16 08/21/2021   CREATININE 0.73 08/21/2021   BILITOT 0.2 08/21/2021   ALKPHOS 60 08/21/2021   AST 22 08/21/2021    ALT 14 03/05/2021   PROT 6.5 08/21/2021   ALBUMIN 4.1 08/21/2021   CALCIUM 9.5 08/21/2021   ANIONGAP 9 03/28/2019   EGFR 95 08/21/2021   Lab Results  Component Value Date   CHOL 177 02/06/2009   Lab Results  Component Value Date   HDL 45 02/06/2009   Lab Results  Component Value Date   LDLCALC 99 02/06/2009   Lab Results  Component Value Date   TRIG 165 (H) 02/06/2009   Lab Results  Component Value Date   CHOLHDL 3.9 Ratio 02/06/2009   Lab Results  Component Value Date   HGBA1C 5.8 02/06/2009      Assessment & Plan:   Problem List Items Addressed This Visit       Cardiovascular and Mediastinum   Essential hypertension     Respiratory   Chronic maxillary sinusitis   Relevant Medications   amoxicillin-clavulanate (AUGMENTIN) 875-125 MG tablet   cetirizine (ZYRTEC) 10 MG tablet     Other   Tooth ache - Primary   Relevant Medications   amoxicillin-clavulanate (AUGMENTIN) 875-125 MG tablet   Need for COVID-19 vaccine   Relevant Orders   Moderna Covid-19 Vaccine Bivalent Booster    Meds ordered this encounter  Medications   amoxicillin-clavulanate (AUGMENTIN) 875-125 MG tablet    Sig: Take 1 tablet by mouth 2 (two) times daily for 14 days.  Dispense:  28 tablet    Refill:  0    Order Specific Question:   Supervising Provider    Answer:   Asencion Noble E [[id number]]   cetirizine (ZYRTEC) 10 MG tablet    Sig: Take 1 tablet (10 mg total) by mouth daily.    Dispense:  30 tablet    Refill:  11    Order Specific Question:   Supervising Provider    Answer:   Asencion Noble E [[id number]]  1. Dental abscess Trial Augmentin.  Patient once again strongly encouraged to complete application for [facility] financial assistance to help with referral to dentistry.  Patient understands and agrees.   - amoxicillin-clavulanate (AUGMENTIN) 875-125 MG tablet; Take 1 tablet by mouth 2 (two) times daily for 14 days.  Dispense: 28 tablet; Refill: 0  2. Chronic maxillary  sinusitis   Sinuses/Orbits: Mucosal thickening of the right maxillary sinus.   Other: None.   IMPRESSION: 1. No acute intracranial abnormality. 2. Mucosal thickening of the right maxillary sinus, findings can be seen in the setting of chronic sinusitis.     Electronically Signed   By: Yetta Glassman M.D.   On: 07/31/2021 15:48  Trial Zyrtec, trial nasal saline spray over-the-counter.  Patient education given on supportive care.  Red flags for prompt reevaluation - amoxicillin-clavulanate (AUGMENTIN) 875-125 MG tablet; Take 1 tablet by mouth 2 (two) times daily for 14 days.  Dispense: 28 tablet; Refill: 0 - cetirizine (ZYRTEC) 10 MG tablet; Take 1 tablet (10 mg total) by mouth daily.  Dispense: 30 tablet; Refill: 11  3. Essential hypertension Patient strongly encouraged to not double up on her blood pressure medication.  Recheck of blood pressure 123/90.  Patient reassurance given.  Patient strongly encouraged to maintain follow-up with clinic pharmacist on September 20, 2021.  4. Need for COVID-19 vaccine  - Moderna Covid-19 Vaccine Bivalent Booster   I have reviewed the patient's medical history (PMH, PSH, Social History, Family History, Medications, and allergies) , and have been updated if relevant. I spent 30 minutes reviewing chart and  face to face time with patient.   Follow-up: Return in about 10 days (around 09/20/2021) for [facility].    Loraine Grip Mayers, PA-C

## 2021-09-10 NOTE — Patient Instructions (Signed)
Your blood pressure is within normal limits.  Please only take your blood pressure medication once daily as directed.  Do not double up your blood pressure medication.  You are going to take Augmentin twice a day for the next 14 days to help with your infection.  You are going to take Zyrtec on a daily basis, continue taking this every day after you finish the Augmentin.  To help you with the nosebleeds, I encourage you to purchase nasal saline spray over-the-counter and use this as needed.  Make sure that you keep your follow-up appointment [facility], 2021.  Kennieth Rad, PA-C Physician Assistant [facility] [email or web address]  Sinusitis, Adult Sinusitis is inflammation of your sinuses. Sinuses are hollow spaces in the bones around your face. Your sinuses are located: Around your eyes. In the middle of your forehead. Behind your nose. In your cheekbones. Mucus normally drains out of your sinuses. When your nasal tissues become inflamed or swollen, mucus can become trapped or blocked. This allows bacteria, viruses, and fungi to grow, which leads to infection. Most infections of the sinuses are caused by a virus. Sinusitis can develop quickly. It can last for up to 4 weeks (acute) or for more than 12 weeks (chronic). Sinusitis often develops after a cold. What are the causes? This condition is caused by anything that creates swelling in the sinuses or stops mucus from draining. This includes: Allergies. Asthma. Infection from bacteria or viruses. Deformities or blockages in your nose or sinuses. Abnormal growths in the nose (nasal polyps). Pollutants, such as chemicals or irritants in the air. Infection from fungi (rare). What increases the risk? You are more likely to develop this condition if you: Have a weak body defense system (immune system). Do a lot  of swimming or diving. Overuse nasal sprays. Smoke. What are the signs or symptoms? The main symptoms of this condition are pain and a feeling of pressure around the affected sinuses. Other symptoms include: Stuffy nose or congestion. Thick drainage from your nose. Swelling and warmth over the affected sinuses. Headache. Upper toothache. A cough that may get worse at night. Extra mucus that collects in the throat or the back of the nose (postnasal drip). Decreased sense of smell and taste. Fatigue. A fever. Sore throat. Bad breath. How is this diagnosed? This condition is diagnosed based on: Your symptoms. Your medical history. A physical exam. Tests to find out if your condition is acute or chronic. This may include: Checking your nose for nasal polyps. Viewing your sinuses using a device that has a light (endoscope). Testing for allergies or bacteria. Imaging tests, such as an MRI or CT scan. In rare cases, a bone biopsy may be done to rule out more serious types of fungal sinus disease. How is this treated? Treatment for sinusitis depends on the cause and whether your condition is chronic or acute. If caused by a virus, your symptoms should go away on their own within 10 days. You may be given medicines to relieve symptoms. They include: Medicines that shrink swollen nasal passages (topical intranasal decongestants). Medicines that treat allergies (antihistamines). A spray that eases inflammation of the nostrils (topical intranasal corticosteroids). Rinses that help get rid of thick mucus in your nose (nasal saline washes). If caused by bacteria, your health care provider may recommend waiting to see if your symptoms improve. Most bacterial infections will get better without antibiotic medicine. You may be  given antibiotics if you have: A severe infection. A weak immune system. If caused by narrow nasal passages or nasal polyps, you may need to have surgery. Follow these  instructions at home: Medicines Take, use, or apply over-the-counter and prescription medicines only as told by your health care provider. These may include nasal sprays. If you were prescribed an antibiotic medicine, take it as told by your health care provider. Do not stop taking the antibiotic even if you start to feel better. Hydrate and humidify  Drink enough fluid to keep your urine pale yellow. Staying hydrated will help to thin your mucus. Use a cool mist humidifier to keep the humidity level in your home above 50%. Inhale steam for 10-15 minutes, 3-4 times a day, or as told by your health care provider. You can do this in the bathroom while a hot shower is running. Limit your exposure to cool or dry air. Rest Rest as much as possible. Sleep with your head raised (elevated). Make sure you get enough sleep each night. General instructions  Apply a warm, moist washcloth to your face 3-4 times a day or as told by your health care provider. This will help with discomfort. Wash your hands often with soap and water to reduce your exposure to germs. If soap and water are not available, use hand sanitizer. Do not smoke. Avoid being around people who are smoking (secondhand smoke). Keep all follow-up visits as told by your health care provider. This is important. Contact a health care provider if: You have a fever. Your symptoms get worse. Your symptoms do not improve within 10 days. Get help right away if: You have a severe headache. You have persistent vomiting. You have severe pain or swelling around your face or eyes. You have vision problems. You develop confusion. Your neck is stiff. You have trouble breathing. Summary Sinusitis is soreness and inflammation of your sinuses. Sinuses are hollow spaces in the bones around your face. This condition is caused by nasal tissues that become inflamed or swollen. The swelling traps or blocks the flow of mucus. This allows bacteria,  viruses, and fungi to grow, which leads to infection. If you were prescribed an antibiotic medicine, take it as told by your health care provider. Do not stop taking the antibiotic even if you start to feel better. Keep all follow-up visits as told by your health care provider. This is important. This information is not intended to replace advice given to you by your health care provider. Make sure you discuss any questions you have with your health care provider. Document Revised: 12/28/2017 Document Reviewed: 12/28/2017 Elsevier Patient Education  2022 [employer].

## 2021-09-13 ENCOUNTER — Other Ambulatory Visit: Payer: Self-pay

## 2021-09-17 ENCOUNTER — Encounter: Payer: Self-pay | Admitting: Family Medicine

## 2021-09-20 ENCOUNTER — Ambulatory Visit: Payer: No Typology Code available for payment source | Admitting: Pharmacist

## 2021-10-08 ENCOUNTER — Other Ambulatory Visit: Payer: Self-pay | Admitting: Physician Assistant

## 2021-10-08 ENCOUNTER — Other Ambulatory Visit: Payer: Self-pay

## 2021-10-08 DIAGNOSIS — I10 Essential (primary) hypertension: Secondary | ICD-10-CM

## 2021-10-14 ENCOUNTER — Other Ambulatory Visit: Payer: Self-pay | Admitting: Family Medicine

## 2021-10-14 ENCOUNTER — Other Ambulatory Visit: Payer: Self-pay

## 2021-10-14 DIAGNOSIS — I10 Essential (primary) hypertension: Secondary | ICD-10-CM

## 2021-10-15 NOTE — Telephone Encounter (Signed)
Requested medication (s) are due for refill today: yes ? ?Requested medication (s) are on the active medication list: yes ? ?Last refill:  08/21/21 #60/1 ? ?Future visit scheduled: yes ? ?Notes to clinic:  Unable to refill per protocol, medication not assigned to the refill protocol. ? ? ? ?  ?Requested Prescriptions  ?Pending Prescriptions Disp Refills  ? lisinopril-hydrochlorothiazide (ZESTORETIC) 20-12.5 MG tablet 60 tablet 1  ?  Sig: Take 2 tablets by mouth daily.  ?  ? There is no refill protocol information for this order  ?  ? ?

## 2021-10-16 MED ORDER — LISINOPRIL-HYDROCHLOROTHIAZIDE 20-12.5 MG PO TABS
2.0000 | ORAL_TABLET | Freq: Every day | ORAL | 1 refills | Status: DC
  Filled 2021-10-16 – 2021-10-31 (×2): qty 60, 30d supply, fill #0
  Filled 2021-11-25: qty 60, 30d supply, fill #1

## 2021-10-17 ENCOUNTER — Other Ambulatory Visit: Payer: Self-pay

## 2021-10-18 ENCOUNTER — Other Ambulatory Visit: Payer: Self-pay

## 2021-10-24 ENCOUNTER — Other Ambulatory Visit: Payer: Self-pay

## 2021-10-29 ENCOUNTER — Ambulatory Visit: Payer: No Typology Code available for payment source | Admitting: Family Medicine

## 2021-10-31 ENCOUNTER — Other Ambulatory Visit: Payer: Self-pay

## 2021-11-25 ENCOUNTER — Other Ambulatory Visit: Payer: Self-pay | Admitting: Physician Assistant

## 2021-11-25 ENCOUNTER — Other Ambulatory Visit: Payer: Self-pay

## 2021-11-25 DIAGNOSIS — N951 Menopausal and female climacteric states: Secondary | ICD-10-CM

## 2021-11-25 DIAGNOSIS — F3112 Bipolar disorder, current episode manic without psychotic features, moderate: Secondary | ICD-10-CM

## 2021-11-26 ENCOUNTER — Other Ambulatory Visit: Payer: Self-pay

## 2021-11-28 ENCOUNTER — Other Ambulatory Visit: Payer: Self-pay

## 2021-12-23 ENCOUNTER — Other Ambulatory Visit: Payer: Self-pay

## 2021-12-23 ENCOUNTER — Other Ambulatory Visit: Payer: Self-pay | Admitting: Family Medicine

## 2021-12-23 ENCOUNTER — Other Ambulatory Visit: Payer: Self-pay | Admitting: Pharmacist

## 2021-12-23 DIAGNOSIS — I10 Essential (primary) hypertension: Secondary | ICD-10-CM

## 2021-12-23 MED ORDER — LISINOPRIL-HYDROCHLOROTHIAZIDE 20-12.5 MG PO TABS
2.0000 | ORAL_TABLET | Freq: Every day | ORAL | 0 refills | Status: DC
  Filled 2021-12-23 – 2022-01-03 (×2): qty 60, 30d supply, fill #0

## 2021-12-23 NOTE — Chronic Care Management (AMB) (Signed)
Patient seen by Leitha Schuller, PharmD Candidate on 12/23/21 while they were picking up prescriptions at [place] [facility].  ? ?Patient has an automated home blood pressure machine. They report home readings 130s/80-90s ? ?Medication review was performed. They are taking medications as prescribed.  ? ?The following barriers to adherence were noted: ?- Denies concerns with medication access or understanding. ? ?The following interventions were completed:  ?- Medications were reviewed ?- Patient was educated on medications, including indication and administration ?- Patient was educated on proper technique to check home blood pressure and reminded to bring home machine and readings to next provider appointment ?- Patient was counseled on lifestyle modifications to improve blood pressure ? ?The patient has follow up scheduled:  ?PCP: none, needs to schedule ? ? ?Catie Hedwig Morton, PharmD, BCACP ?[place] ?[phone] ? ?

## 2021-12-26 ENCOUNTER — Emergency Department (HOSPITAL_COMMUNITY)
Admission: EM | Admit: 2021-12-26 | Discharge: 2021-12-26 | Disposition: A | Discharge status: Home / Self Care | Payer: No Typology Code available for payment source | Attending: Emergency Medicine | Admitting: Emergency Medicine

## 2021-12-26 ENCOUNTER — Encounter (HOSPITAL_COMMUNITY): Payer: Self-pay | Admitting: Emergency Medicine

## 2021-12-26 ENCOUNTER — Other Ambulatory Visit: Payer: Self-pay

## 2021-12-26 DIAGNOSIS — K0889 Other specified disorders of teeth and supporting structures: Secondary | ICD-10-CM

## 2021-12-26 DIAGNOSIS — K029 Dental caries, unspecified: Secondary | ICD-10-CM | POA: Insufficient documentation

## 2021-12-26 DIAGNOSIS — I10 Essential (primary) hypertension: Secondary | ICD-10-CM | POA: Insufficient documentation

## 2021-12-26 DIAGNOSIS — Z79899 Other long term (current) drug therapy: Secondary | ICD-10-CM | POA: Insufficient documentation

## 2021-12-26 MED ORDER — AMOXICILLIN-POT CLAVULANATE 875-125 MG PO TABS
1.0000 | ORAL_TABLET | Freq: Once | ORAL | Status: AC
  Administered 2021-12-26: 1 via ORAL
  Filled 2021-12-26: qty 1

## 2021-12-26 MED ORDER — AMOXICILLIN-POT CLAVULANATE 875-125 MG PO TABS
1.0000 | ORAL_TABLET | Freq: Two times a day (BID) | ORAL | 0 refills | Status: DC
  Filled 2021-12-26 – 2022-01-03 (×2): qty 14, 7d supply, fill #0

## 2021-12-26 MED ORDER — NAPROXEN 375 MG PO TABS
375.0000 mg | ORAL_TABLET | Freq: Two times a day (BID) | ORAL | 0 refills | Status: DC
  Filled 2021-12-26: qty 20, 10d supply, fill #0

## 2021-12-26 MED ORDER — KETOROLAC TROMETHAMINE 30 MG/ML IJ SOLN
30.0000 mg | Freq: Once | INTRAMUSCULAR | Status: AC
  Administered 2021-12-26: 30 mg via INTRAMUSCULAR
  Filled 2021-12-26: qty 1

## 2021-12-26 NOTE — Discharge Instructions (Addendum)
Try to follow-up with the dentist as soon as possible.  Take the medications as prescribed

## 2021-12-26 NOTE — ED Triage Notes (Signed)
C/o dental pain, "I have a lot of rotten teeth" and states is unable to get all of blood pressure meds filled - [facility] and would like to speak to a Education officer, museum.

## 2021-12-26 NOTE — ED Provider Notes (Signed)
[facility] EMERGENCY DEPARTMENT Provider Note   CSN: [id number] Arrival date & time: 12/26/21  9509     History  Chief Complaint  Patient presents with   Dental Pain   requests social work    Jennifer [name] is a 61 y.o. female.   Dental Pain  Patient has a history of fibroids, polysubstance abuse, hypertension, bipolar disorder, depression.  Home Medications Prior to Admission medications   Medication Sig Start Date End Date Taking? Authorizing Provider  amoxicillin-clavulanate (AUGMENTIN) 875-125 MG tablet Take 1 tablet by mouth every 12 (twelve) hours. 12/26/21  Yes Dorie Rank, MD  naproxen (NAPROSYN) 375 MG tablet Take 1 tablet (375 mg total) by mouth 2 (two) times daily. 12/26/21  Yes Dorie Rank, MD  amLODipine (NORVASC) 10 MG tablet TAKE 1 TABLET (10 MG TOTAL) BY MOUTH DAILY. 08/21/21 08/21/22  Mayers, Cari S, PA-C  cetirizine (ZYRTEC) 10 MG tablet Take 1 tablet (10 mg total) by mouth daily. 09/10/21   Mayers, Cari S, PA-C  gabapentin (NEURONTIN) 300 MG capsule Take 1 capsule (300 mg total) by mouth at bedtime. 08/21/21   Mayers, Cari S, PA-C  hydrOXYzine (ATARAX) 25 MG tablet Take 1 tablet (25 mg total) by mouth at bedtime as needed. 08/21/21   Mayers, Cari S, PA-C  ibuprofen (ADVIL) 800 MG tablet Take 1 tablet (800 mg total) by mouth 3 (three) times daily. Patient not taking: Reported on 03/14/2021 12/18/20   Charlesetta Shanks, MD  lisinopril-hydrochlorothiazide (ZESTORETIC) 20-12.5 MG tablet Take 2 tablets by mouth daily. 12/23/21   Charlott Rakes, MD  metoprolol succinate (TOPROL-XL) 50 MG 24 hr tablet TAKE 1 TABLET (50 MG TOTAL) BY MOUTH DAILY. 08/21/21 08/21/22  Mayers, Cari S, PA-C  ondansetron (ZOFRAN) 4 MG tablet Take 1 tablet (4 mg total) by mouth every 8 (eight) hours as needed for nausea or vomiting. 06/03/21   Charlott Rakes, MD  oxyCODONE-acetaminophen (PERCOCET/ROXICET) 5-325 MG tablet Take 1 tablet by mouth every 6 (six) hours as needed for severe  pain. Patient not taking: Reported on 06/03/2021 11/28/20   Luna Fuse, MD  pantoprazole (PROTONIX) 40 MG tablet Take 1 tablet (40 mg total) by mouth daily. 08/21/21   Mayers, Cari S, PA-C      Allergies    Patient has no known allergies.    Review of Systems   Review of Systems  Physical Exam Updated Vital Signs BP 124/90 (BP Location: Right Arm)   Pulse (!) 51   Temp 98.7 F (37.1 C) (Oral)   Resp 18   Ht 1.626 m ('5\' 4"'$ )   Wt 90.7 kg   LMP 12/04/2009   SpO2 97%   BMI 34.33 kg/m  Physical Exam  ED Results / Procedures / Treatments   Labs (all labs ordered are listed, but only abnormal results are displayed) Labs Reviewed - No data to display  EKG None  Radiology No results found.  Procedures Procedures    Medications Ordered in ED Medications  ketorolac (TORADOL) 30 MG/ML injection 30 mg (has no administration in time range)  amoxicillin-clavulanate (AUGMENTIN) 875-125 MG per tablet 1 tablet (has no administration in time range)    ED Course/ Medical Decision Making/ A&P                           Medical Decision Making Problems Addressed: Dental caries: chronic illness or injury Toothache: complicated acute illness or injury  Amount and/or Complexity of Data Reviewed External  Data Reviewed: notes.    Details: Outpatient notes reviewed.  [facility] and spoke to the pharmacist regarding her medications  Risk Diagnosis or treatment significantly limited by social determinants of health.   Patient with history of dental caries.  States previously Augmentin worked better than amoxicillin.  No obvious signs of swelling on exam.  No submandibular swelling.  No signs of severe abscess.  We will start the patient on antibiotics.  Outpatient records reviewed and patient has been dealing with these issues for a while.  Will require follow-up with a dentist.  Financial constraints have been an issue in  the past.  Dental resource guide provided.        Final Clinical Impression(s) / ED Diagnoses Final diagnoses:  Toothache  Dental caries    Rx / DC Orders ED Discharge Orders          Ordered    amoxicillin-clavulanate (AUGMENTIN) 875-125 MG tablet  Every 12 hours        12/26/21 1039    naproxen (NAPROSYN) 375 MG tablet  2 times daily        12/26/21 1039              Dorie Rank, MD 12/26/21 1041

## 2022-01-02 ENCOUNTER — Other Ambulatory Visit: Payer: Self-pay

## 2022-01-03 ENCOUNTER — Other Ambulatory Visit: Payer: Self-pay

## 2022-01-07 ENCOUNTER — Other Ambulatory Visit: Payer: Self-pay | Admitting: Pharmacist

## 2022-01-07 NOTE — Chronic Care Management (AMB) (Signed)
Patient seen by Park Liter, PharmD Candidate on 01/03/22 while they were picking up prescriptions at [place] at [facility].   Patient has an automated home blood pressure machine. They report home readings 130s/80-90s  Medication review was performed. They are taking medications as prescribed.   The following barriers to adherence were noted: - Affordability and - Transportation - reports she is at the maximum allowance of her charge account at [facility], was presenting to pick up an antibiotic today. She was able to pick up the prescription, but discussed that she may not be able to charge anymore to her account moving forward.   The following interventions were completed:  - Medications were reviewed - Patient was educated on medications, including indication and administration  The patient has follow up scheduled:  PCP: 01/22/22   Catie Hedwig Morton, PharmD, [address] [phone]

## 2022-01-08 NOTE — Progress Notes (Signed)
Called patient to follow up on reported concerns of medication costs. Was unable to reach patient and unable to leave a voicemail. She does not have MyChart set up.   It appears the patient has been seen multiple times for dental issues. She was provided with a handout with dental resources in the ED (on printed AVS). Next primary care follow up is scheduled for 01/22/22. Recommend following up with patient regarding if she was able to schedule with one of these dentists and if she has been able to fill her medications as prescribed given her concerns that her charge account is maxed out. She may benefit from a social work referral to assist with these issues.

## 2022-01-22 ENCOUNTER — Ambulatory Visit: Payer: No Typology Code available for payment source | Admitting: Physician Assistant

## 2022-01-23 ENCOUNTER — Other Ambulatory Visit: Payer: Self-pay

## 2022-02-17 ENCOUNTER — Other Ambulatory Visit: Payer: Self-pay | Admitting: Family Medicine

## 2022-02-17 ENCOUNTER — Other Ambulatory Visit: Payer: Self-pay

## 2022-02-17 DIAGNOSIS — I10 Essential (primary) hypertension: Secondary | ICD-10-CM

## 2022-02-19 ENCOUNTER — Ambulatory Visit: Payer: Self-pay | Admitting: Physician Assistant

## 2022-02-19 ENCOUNTER — Encounter: Payer: Self-pay | Admitting: Physician Assistant

## 2022-02-19 ENCOUNTER — Other Ambulatory Visit: Payer: Self-pay

## 2022-02-19 VITALS — BP 139/96 | HR 64 | Resp 18 | Ht 64.0 in | Wt 196.0 lb

## 2022-02-19 DIAGNOSIS — K047 Periapical abscess without sinus: Secondary | ICD-10-CM

## 2022-02-19 DIAGNOSIS — F3112 Bipolar disorder, current episode manic without psychotic features, moderate: Secondary | ICD-10-CM

## 2022-02-19 DIAGNOSIS — E7800 Pure hypercholesterolemia, unspecified: Secondary | ICD-10-CM

## 2022-02-19 DIAGNOSIS — M25511 Pain in right shoulder: Secondary | ICD-10-CM

## 2022-02-19 DIAGNOSIS — Z13220 Encounter for screening for lipoid disorders: Secondary | ICD-10-CM

## 2022-02-19 DIAGNOSIS — R2231 Localized swelling, mass and lump, right upper limb: Secondary | ICD-10-CM

## 2022-02-19 DIAGNOSIS — I10 Essential (primary) hypertension: Secondary | ICD-10-CM

## 2022-02-19 MED ORDER — LISINOPRIL-HYDROCHLOROTHIAZIDE 20-12.5 MG PO TABS
2.0000 | ORAL_TABLET | Freq: Every day | ORAL | 2 refills | Status: DC
  Filled 2022-02-19: qty 60, 30d supply, fill #0
  Filled 2022-03-21: qty 60, 30d supply, fill #1
  Filled 2022-04-21: qty 60, 30d supply, fill #2

## 2022-02-19 MED ORDER — CLONIDINE HCL 0.1 MG PO TABS
0.1000 mg | ORAL_TABLET | Freq: Once | ORAL | Status: AC
  Administered 2022-02-19: 0.1 mg via ORAL

## 2022-02-19 MED ORDER — AMOXICILLIN-POT CLAVULANATE 875-125 MG PO TABS
1.0000 | ORAL_TABLET | Freq: Two times a day (BID) | ORAL | 0 refills | Status: DC
  Filled 2022-02-19: qty 14, 7d supply, fill #0

## 2022-02-19 MED ORDER — KETOROLAC TROMETHAMINE 60 MG/2ML IM SOLN
60.0000 mg | Freq: Once | INTRAMUSCULAR | Status: AC
  Administered 2022-02-19: 60 mg via INTRAMUSCULAR

## 2022-02-19 MED ORDER — AMLODIPINE BESYLATE 10 MG PO TABS
ORAL_TABLET | Freq: Every day | ORAL | 2 refills | Status: DC
  Filled 2022-02-19: qty 30, 30d supply, fill #0
  Filled 2022-03-21: qty 30, 30d supply, fill #1
  Filled 2022-04-21: qty 30, 30d supply, fill #2

## 2022-02-19 MED ORDER — METOPROLOL SUCCINATE ER 50 MG PO TB24
ORAL_TABLET | Freq: Every day | ORAL | 2 refills | Status: DC
  Filled 2022-02-19: qty 30, 30d supply, fill #0
  Filled 2022-03-21: qty 30, 30d supply, fill #1
  Filled 2022-04-21: qty 30, 30d supply, fill #2

## 2022-02-19 MED ORDER — GABAPENTIN 300 MG PO CAPS
300.0000 mg | ORAL_CAPSULE | Freq: Every day | ORAL | 2 refills | Status: DC
  Filled 2022-02-19 – 2022-03-10 (×2): qty 30, 30d supply, fill #0

## 2022-02-19 NOTE — Progress Notes (Unsigned)
Established Patient Office Visit  Subjective   [name], female    DOB: [birth year]  Age: 61 y.o. MRN: [id number]  Chief Complaint  Patient presents with   Hypertension    Feels like blood pressure is high and is causing throwing up. Been out for two days.   Arm Pain    Growth on shoulder    States that she has been out of her blood pressure medication for the past 2 days.  States that she had an episode of vomiting yesterday and feels poorly when she is out of her blood pressure medication.  States that she believes the episode of vomiting was due to her blood pressure being elevated.  Does not check blood pressure at home.  States that she is unable to pick up her medications until tomorrow due to financial constraints.  States that she continues to have pain from her dental abscess.  States that she was seen in May in the emergency department and was given Augmentin with some relief, states that the abscess did start causing her pain again approximately 2 weeks ago.  States that she is unable to afford dental care at this time.  States that she has been having severe pain in her right shoulder with any type of movement.  States that she feels it is due to a "lump" on her right shoulder.  States the lump has been there for "a long time" states that she feels that it is getting bigger and causing her to have pain.      Past Medical History:  Diagnosis Date   bipolar    Bipolar 1 disorder ([place])    Depression    Fibroids    now s/p hysterectomy   Hypertension    Left ventricular hypertrophy    Polysubstance abuse ([place])    IV drug [place], cocaine on occassion, smoking and alcoholism, has been going to AA, not completely sober   Social History   Socioeconomic History   Marital status: Single    Spouse name: Not on file   Number of children: 0   Years of education: Not on file   Highest education level: Some college, no degree  Occupational History   Not on file   Tobacco Use   Smoking status: Every Day    Packs/day: 1.00    Years: 20.00    Total pack years: 20.00    Types: Cigarettes   Smokeless tobacco: Never   Tobacco comments:    Wants to quit.   Vaping Use   Vaping Use: Never used  Substance and Sexual Activity   Alcohol use: Yes    Comment: occ.   Drug use: Not Currently    Types: Cocaine, Marijuana   Sexual activity: Not Currently    Birth control/protection: Surgical  Other Topics Concern   Not on file  Social History Narrative   Financial assistance approved for 100% discount at [facility] and has [facility] card per Bonna Gains   07/19/2010         Social Determinants of Health   Financial Resource Strain: Not on file  Food Insecurity: Food Insecurity Present (03/14/2021)   Hunger Vital Sign    Worried About [place] in the Last Year: Often true    Ran Out of Food in the Last Year: Often true  Transportation Needs: Unmet Transportation Needs (03/14/2021)   PRAPARE - Hydrologist (Medical): Yes    Lack  of Transportation (Non-Medical): Yes  Physical Activity: Not on file  Stress: Not on file  Social Connections: Not on file  Intimate Partner Violence: Not on file   Family History  Problem Relation Age of Onset   Hypertension Mother    Hypertension Sister    Diabetes Father    No Known Allergies  Review of Systems  Constitutional:  Negative for chills and fever.  HENT: Negative.    Eyes: Negative.   Respiratory:  Negative for shortness of breath.   Cardiovascular:  Negative for chest pain.  Gastrointestinal:  Positive for vomiting. Negative for abdominal pain, heartburn and nausea.  Genitourinary: Negative.   Musculoskeletal:  Positive for joint pain.  Skin: Negative.   Neurological:  Negative for weakness and headaches.  Endo/Heme/Allergies: Negative.   Psychiatric/Behavioral: Negative.        Objective:     BP (!) 139/96 (BP Location: Left Arm, Patient Position: Sitting,  Cuff Size: Normal)   Pulse 64   Resp 18   Ht '5\' 4"'$  (1.626 m)   Wt 196 lb (88.9 kg)   LMP 12/04/2009   SpO2 96%   BMI 33.64 kg/m  BP Readings from Last 3 Encounters:  02/19/22 (!) 139/96  01/07/22 127/86  12/26/21 124/90   Wt Readings from Last 3 Encounters:  02/19/22 196 lb (88.9 kg)  12/26/21 200 lb (90.7 kg)  09/10/21 202 lb (91.6 kg)      Physical Exam Vitals and nursing note reviewed.  Constitutional:      Appearance: Normal appearance.  HENT:     Head: Normocephalic and atraumatic.     Right Ear: External ear normal.     Left Ear: External ear normal.     Nose: Nose normal.     Mouth/Throat:     Lips: Pink.     Mouth: Mucous membranes are moist.     Dentition: Abnormal dentition. Dental tenderness, dental caries and dental abscesses present.  Eyes:     General: Scleral icterus present.     Extraocular Movements: Extraocular movements intact.  Cardiovascular:     Rate and Rhythm: Normal rate and regular rhythm.     Pulses: Normal pulses.     Heart sounds: Normal heart sounds.  Pulmonary:     Effort: Pulmonary effort is normal.     Breath sounds: Normal breath sounds.  Musculoskeletal:     Right shoulder: Tenderness present. Decreased range of motion.       Arms:     Cervical back: Normal range of motion and neck supple.     Comments: Subcutaneous mobile mass, approximately 8cm x 4cm, tender to light touch  Skin:    General: Skin is warm and dry.  Neurological:     General: No focal deficit present.     Mental Status: She is alert and oriented to person, place, and time.  Psychiatric:        Mood and Affect: Mood normal.        Behavior: Behavior normal.        Thought Content: Thought content normal.        Judgment: Judgment normal.        Assessment & Plan:   Problem List Items Addressed This Visit       Cardiovascular and Mediastinum   Essential hypertension - Primary   Relevant Medications   amLODipine (NORVASC) 10 MG tablet    lisinopril-hydrochlorothiazide (ZESTORETIC) 20-12.5 MG tablet   metoprolol succinate (TOPROL-XL) 50 MG 24 hr tablet  Other Relevant Orders   Comp. Metabolic Panel (12) (Completed)     Other   Bipolar disorder, current episode manic without psychotic features, moderate (HCC) (Chronic)   Relevant Medications   gabapentin (NEURONTIN) 300 MG capsule   Other Visit Diagnoses     Acute pain of right shoulder       Relevant Medications   ketorolac (TORADOL) injection 60 mg (Completed)   Mass of skin of right shoulder       Relevant Orders   US SOFT TISSUE RT UPPER EXTREMITY LTD (NON-VASCULAR)   Dental abscess       Relevant Medications   amoxicillin-clavulanate (AUGMENTIN) 875-125 MG tablet   Screening, lipid       Relevant Orders   Lipid panel (Completed)      1. Essential hypertension Continue current regimen.  Patient encouraged to check blood pressure at home on a daily basis, keep a written log and have available for all office visits.  Red flags given for prompt reevaluation.  Patient given dose of clonidine in office, states that she is unable to pick up her blood pressure medications today and is concerned that her blood pressure will continue to elevate throughout the day.   - amLODipine (NORVASC) 10 MG tablet; TAKE 1 TABLET (10 MG TOTAL) BY MOUTH DAILY.  Dispense: 30 tablet; Refill: 2 - lisinopril-hydrochlorothiazide (ZESTORETIC) 20-12.5 MG tablet; Take 2 tablets by mouth daily.  Dispense: 60 tablet; Refill: 2 - metoprolol succinate (TOPROL-XL) 50 MG 24 hr tablet; TAKE 1 TABLET (50 MG TOTAL) BY MOUTH DAILY.  Dispense: 30 tablet; Refill: 2  2. Acute pain of right shoulder  - ketorolac (TORADOL) injection 60 mg  3. Mass of skin of right shoulder Patient strongly encouraged to continue working on application for [employer] health financial assistance.  Patient education given on supportive care. - US SOFT TISSUE RT UPPER EXTREMITY LTD (NON-VASCULAR); Future - Comp. Metabolic  Panel (12)  4. Bipolar disorder, current episode manic without psychotic features, moderate (HCC) Continue current regimen - gabapentin (NEURONTIN) 300 MG capsule; Take 1 capsule (300 mg total) by mouth at bedtime.  Dispense: 30 capsule; Refill: 2  5. Dental abscess  Trial Augmentin.  Patient given dental resources once again.  Strongly encouraged to follow-up with dental as soon as able. - amoxicillin-clavulanate (AUGMENTIN) 875-125 MG tablet; Take 1 tablet by mouth every 12 (twelve) hours.  Dispense: 14 tablet; Refill: 0  6. Screening, lipid  - Lipid panel   I have reviewed the patient's medical history (PMH, PSH, Social History, Family History, Medications, and allergies) , and have been updated if relevant. I spent 45 minutes reviewing chart and  face to face time with patient.    Return if symptoms worsen or fail to improve.    Loraine Grip Mayers, PA-C

## 2022-02-19 NOTE — Patient Instructions (Signed)
I have refilled your blood pressure medications for you.  You are going to use Augmentin to help with your dental abscess, I strongly encourage you to follow-up with the dental resources as soon as you are able.  We will call you with the appointment time for the ultrasound of your right shoulder.  It is very important that you finish your paperwork for the [place] financial assistance so we are able to help you with this process.  We will call you with today's lab results.  Kennieth Rad, PA-C Physician Assistant [facility] [email or web address]   Lipoma  A lipoma is a noncancerous (benign) tumor that is made up of fat cells. This is a very common type of soft-tissue growth. Lipomas are usually found under the skin (subcutaneous). They may occur in any tissue of the body that contains fat. Common areas for lipomas to appear include the back, arms, shoulders, buttocks, and thighs. Lipomas grow slowly, and they are usually painless. Most lipomas do not cause problems and do not require treatment. What are the causes? The cause of this condition is not known. What increases the risk? You are more likely to develop this condition if: You are 23-63 years old. You have a family history of lipomas. What are the signs or symptoms? A lipoma usually appears as a small, round bump under the skin. In most cases, the lump will: Feel soft or rubbery. Not cause pain or other symptoms. However, if a lipoma is located in an area where it pushes on nerves, it can become painful or cause other symptoms. How is this diagnosed? A lipoma can usually be diagnosed with a physical exam. You may also have tests to confirm the diagnosis and to rule out other conditions. Tests may include: Imaging tests, such as a CT scan or an MRI. Removal of a tissue sample to be looked at under a microscope (biopsy). How is this treated? Treatment for this  condition depends on the size of the lipoma and whether it is causing any symptoms. For small lipomas that are not causing problems, no treatment is needed. If a lipoma is bigger or it causes problems, surgery may be done to remove the lipoma. Lipomas can also be removed to improve appearance. Most often, the procedure is done after applying a medicine that numbs the area (local anesthetic). Liposuction may be done to reduce the size of the lipoma before it is removed through surgery, or it may be done to remove the lipoma. Lipomas are removed with this method to limit incision size and scarring. A liposuction tube is inserted through a small incision into the lipoma, and the contents of the lipoma are removed through the tube with suction. Follow these instructions at home: Watch your lipoma for any changes. Keep all follow-up visits. This is important. Where to find more information OrthoInfo: orthoinfo.aaos.org Contact a health care provider if: Your lipoma becomes larger or hard. Your lipoma becomes painful, red, or increasingly swollen. These could be signs of infection or a more serious condition. Get help right away if: You develop tingling or numbness in an area near the lipoma. This could indicate that the lipoma is causing nerve damage. Summary A lipoma is a noncancerous tumor that is made up of fat cells. Most lipomas do not cause problems and do not require treatment. If a lipoma is bigger or it causes problems, surgery may be done to remove the lipoma. Contact a health care provider if your lipoma  becomes larger or hard, or if it becomes painful, red, or increasingly swollen. These could be signs of infection or a more serious condition. This information is not intended to replace advice given to you by your health care provider. Make sure you discuss any questions you have with your health care provider. Document Revised: 08/16/2021 Document Reviewed: 08/16/2021 Elsevier Patient  Education  [place].

## 2022-02-20 ENCOUNTER — Other Ambulatory Visit: Payer: Self-pay

## 2022-02-20 DIAGNOSIS — R2231 Localized swelling, mass and lump, right upper limb: Secondary | ICD-10-CM | POA: Insufficient documentation

## 2022-02-20 LAB — COMP. METABOLIC PANEL (12)
AST: 16 IU/L (ref 0–40)
Albumin/Globulin Ratio: 1.6 (ref 1.2–2.2)
Albumin: 4.2 g/dL (ref 3.8–4.9)
Alkaline Phosphatase: 58 IU/L (ref 44–121)
BUN/Creatinine Ratio: 15 (ref 9–23)
BUN: 15 mg/dL (ref 6–24)
Bilirubin Total: 0.3 mg/dL (ref 0.0–1.2)
Calcium: 9.9 mg/dL (ref 8.7–10.2)
Chloride: 107 mmol/L — ABNORMAL HIGH (ref 96–106)
Creatinine, Ser: 0.98 mg/dL (ref 0.57–1.00)
Globulin, Total: 2.6 g/dL (ref 1.5–4.5)
Glucose: 106 mg/dL — ABNORMAL HIGH (ref 70–99)
Potassium: 4.1 mmol/L (ref 3.5–5.2)
Sodium: 142 mmol/L (ref 134–144)
Total Protein: 6.8 g/dL (ref 6.0–8.5)
eGFR: 67 mL/min/{1.73_m2} (ref >59)

## 2022-02-20 LAB — LIPID PANEL
Chol/HDL Ratio: 3.3 ratio (ref 0.0–4.4)
Cholesterol, Total: 187 mg/dL (ref 100–199)
HDL: 56 mg/dL (ref >39)
LDL Chol Calc (NIH): 116 mg/dL — ABNORMAL HIGH (ref 0–99)
Triglycerides: 84 mg/dL (ref 0–149)
VLDL Cholesterol Cal: 15 mg/dL (ref 5–40)

## 2022-02-20 MED ORDER — ATORVASTATIN CALCIUM 10 MG PO TABS
10.0000 mg | ORAL_TABLET | Freq: Every day | ORAL | 3 refills | Status: DC
  Filled 2022-02-20 – 2022-09-18 (×3): qty 30, 30d supply, fill #0
  Filled 2022-10-16: qty 30, 30d supply, fill #1

## 2022-02-20 NOTE — Addendum Note (Signed)
Addended by: Kennieth Rad on: 02/20/2022 09:12 AM   Modules accepted: Orders

## 2022-02-21 ENCOUNTER — Other Ambulatory Visit: Payer: Self-pay

## 2022-02-26 ENCOUNTER — Other Ambulatory Visit: Payer: Self-pay

## 2022-03-03 ENCOUNTER — Ambulatory Visit (HOSPITAL_COMMUNITY): Payer: Self-pay

## 2022-03-10 ENCOUNTER — Other Ambulatory Visit: Payer: Self-pay | Admitting: Pharmacist

## 2022-03-10 ENCOUNTER — Other Ambulatory Visit: Payer: Self-pay

## 2022-03-10 NOTE — Chronic Care Management (AMB) (Signed)
Patient seen by Park Liter, PharmD Candidate on 03/10/22 while they were picking up prescriptions at [place] at [facility]. Pt was previously seen at pharmacy on 01/03/22. Pt reports having one cup of coffee in the past hour, denies nicotine use today.   Blood pressure today was : 99/67, HR 52   Patient has an automated home blood pressure machine. They report home readings 130s/80s-90s   Medication review was performed. They are taking medications as prescribed, however patient reported that she took extra doses of all her blood pressure medications last night (amlodipine, metoprolol, lisinopril-HCTZ) as she was experiencing N/V and sweating which she felt was related to her blood pressure secondary to a tooth infection. Pt denied dizziness or lightheadedness today.   The following barriers to adherence were noted:  - Affordability   The following interventions were completed:  - Medications were reviewed  - Patient was educated on medications, including indication and administration. Discussed importance of only taking medications once/day to avoid adverse effects and hypotension.   The patient has follow up scheduled on 05/27/22:  PCP: Dr. Sheran Fava, PharmD, [facility]  [phone]

## 2022-03-17 ENCOUNTER — Other Ambulatory Visit: Payer: Self-pay

## 2022-03-21 ENCOUNTER — Other Ambulatory Visit: Payer: Self-pay

## 2022-04-21 ENCOUNTER — Other Ambulatory Visit: Payer: Self-pay

## 2022-04-28 ENCOUNTER — Encounter (HOSPITAL_COMMUNITY): Payer: Self-pay

## 2022-04-28 ENCOUNTER — Emergency Department (HOSPITAL_COMMUNITY)
Admission: EM | Admit: 2022-04-28 | Discharge: 2022-04-28 | Disposition: A | Discharge status: Home / Self Care | Payer: Self-pay | Attending: Emergency Medicine | Admitting: Emergency Medicine

## 2022-04-28 ENCOUNTER — Other Ambulatory Visit: Payer: Self-pay

## 2022-04-28 DIAGNOSIS — I10 Essential (primary) hypertension: Secondary | ICD-10-CM | POA: Insufficient documentation

## 2022-04-28 DIAGNOSIS — Z79899 Other long term (current) drug therapy: Secondary | ICD-10-CM | POA: Insufficient documentation

## 2022-04-28 DIAGNOSIS — K0889 Other specified disorders of teeth and supporting structures: Secondary | ICD-10-CM | POA: Insufficient documentation

## 2022-04-28 DIAGNOSIS — M25511 Pain in right shoulder: Secondary | ICD-10-CM | POA: Insufficient documentation

## 2022-04-28 LAB — BASIC METABOLIC PANEL
Anion gap: 11 (ref 5–15)
BUN: 11 mg/dL (ref 6–20)
CO2: 25 mmol/L (ref 22–32)
Calcium: 10.2 mg/dL (ref 8.9–10.3)
Chloride: 105 mmol/L (ref 98–111)
Creatinine, Ser: 1.04 mg/dL — ABNORMAL HIGH (ref 0.44–1.00)
GFR, Estimated: >60 mL/min (ref >60)
Glucose, Bld: 89 mg/dL (ref 70–99)
Potassium: 3.8 mmol/L (ref 3.5–5.1)
Sodium: 141 mmol/L (ref 135–145)

## 2022-04-28 MED ORDER — HYDROCODONE-ACETAMINOPHEN 5-325 MG PO TABS
1.0000 | ORAL_TABLET | Freq: Once | ORAL | Status: AC
  Administered 2022-04-28: 1 via ORAL
  Filled 2022-04-28: qty 1

## 2022-04-28 MED ORDER — KETOROLAC TROMETHAMINE 30 MG/ML IJ SOLN
30.0000 mg | Freq: Once | INTRAMUSCULAR | Status: AC
  Administered 2022-04-28: 30 mg via INTRAMUSCULAR
  Filled 2022-04-28: qty 1

## 2022-04-28 MED ORDER — AMOXICILLIN-POT CLAVULANATE 875-125 MG PO TABS
1.0000 | ORAL_TABLET | Freq: Two times a day (BID) | ORAL | 0 refills | Status: DC
  Filled 2022-04-28: qty 14, 7d supply, fill #0

## 2022-04-28 MED ORDER — AMOXICILLIN-POT CLAVULANATE 875-125 MG PO TABS: 1.0000 | ORAL_TABLET | Freq: Two times a day (BID) | ORAL | 0 refills | Status: DC

## 2022-04-28 NOTE — ED Notes (Signed)
IV team at bedside 

## 2022-04-28 NOTE — ED Notes (Signed)
Phlebotomy notified of need for lab draw.

## 2022-04-28 NOTE — ED Notes (Signed)
Reviewed discharge instructions with patient. Follow-up care and medications reviewed. Patient verbalized understanding. Patient A&Ox4, VSS, and ambulatory with steady gait upon discharge. Pt discharged by [facility].

## 2022-04-28 NOTE — ED Triage Notes (Signed)
Patient complains of right jaw pain that started last night "thinks its from my raggedy mouth".  Also right shoulder pain that has been going on for awhile. Patient report it hurts to talk.  Patient reports pain to right arm with movement or touch.  Patient reports she doesn't want to have to keep repeating why she is here.  Ems reports patient was uncooperative and hostile.

## 2022-04-28 NOTE — ED Notes (Signed)
Pt came to nurses station and was agitated d/t her not having an IV to get her pain medication. Explained to pt that IV team consult was placed and pt stated, " but I need my medicine now". Pt remains verbally aggressive. Jarrett Soho RN going to give pt her IM toradol at this time.

## 2022-04-28 NOTE — ED Notes (Signed)
Pt stating that she just wants her abx and wants to leave. Provider has been notified. Waiting on response.

## 2022-04-28 NOTE — ED Provider Notes (Signed)
[facility] EMERGENCY DEPARTMENT Provider Note   CSN: [id number] Arrival date & time: 04/28/22  8295     History  Chief Complaint  Patient presents with   Jaw Pain   Arm Pain    Jennifer [name] is a 61 y.o. female.  Patient presents to the hospital via EMS complaining of right-sided jaw pain.  Patient also complains of right-sided shoulder pain.  Patient states that the pain in her jaw began last night.  She thinks that is due to poor dentition.  She states that she feels like she has some swelling of the jaw and also complains of pain when talking.  The patient states she has a history of dental issues and had antibiotics for possible dental infection approximately 2 months ago.  She states she has been unable to afford dental care, needing multiple extractions which cost 200 or more per tooth depending on severity.  The patient also complains of right-sided shoulder pain but states that she has been evaluated in the past and they have recommended advanced imaging.  Patient describes pain when trying to abduct her arm.  She denies any recent trauma or injury to the area.  Past medical history significant for hypertension, depression, polysubstance abuse, bipolar 1 disorder, right shoulder lipoma, GERD, homelessness, right shoulder joint pain, abdominal pain  HPI     Home Medications Prior to Admission medications   Medication Sig Start Date End Date Taking? Authorizing Provider  amoxicillin-clavulanate (AUGMENTIN) 875-125 MG tablet Take 1 tablet by mouth every 12 (twelve) hours. 04/28/22  Yes Cherlynn June B, PA-C  amLODipine (NORVASC) 10 MG tablet TAKE 1 TABLET (10 MG TOTAL) BY MOUTH DAILY. 02/19/22 02/19/23  Mayers, Cari S, PA-C  atorvastatin (LIPITOR) 10 MG tablet Take 1 tablet (10 mg total) by mouth daily. 02/20/22   Mayers, Cari S, PA-C  cetirizine (ZYRTEC) 10 MG tablet Take 1 tablet (10 mg total) by mouth daily. 09/10/21   Mayers, Cari S, PA-C  gabapentin  (NEURONTIN) 300 MG capsule Take 1 capsule (300 mg total) by mouth at bedtime. 02/19/22   Mayers, Cari S, PA-C  hydrOXYzine (ATARAX) 25 MG tablet Take 1 tablet (25 mg total) by mouth at bedtime as needed. 08/21/21   Mayers, Cari S, PA-C  ibuprofen (ADVIL) 800 MG tablet Take 1 tablet (800 mg total) by mouth 3 (three) times daily. Patient not taking: Reported on 03/14/2021 12/18/20   Charlesetta Shanks, MD  lisinopril-hydrochlorothiazide (ZESTORETIC) 20-12.5 MG tablet Take 2 tablets by mouth daily. 02/19/22   Mayers, Cari S, PA-C  metoprolol succinate (TOPROL-XL) 50 MG 24 hr tablet TAKE 1 TABLET (50 MG TOTAL) BY MOUTH DAILY. 02/19/22 02/19/23  Mayers, Cari S, PA-C  naproxen (NAPROSYN) 375 MG tablet Take 1 tablet (375 mg total) by mouth 2 (two) times daily. Patient not taking: Reported on 02/19/2022 12/26/21   Dorie Rank, MD  ondansetron (ZOFRAN) 4 MG tablet Take 1 tablet (4 mg total) by mouth every 8 (eight) hours as needed for nausea or vomiting. Patient not taking: Reported on 02/19/2022 06/03/21   Charlott Rakes, MD  oxyCODONE-acetaminophen (PERCOCET/ROXICET) 5-325 MG tablet Take 1 tablet by mouth every 6 (six) hours as needed for severe pain. Patient not taking: Reported on 06/03/2021 11/28/20   Luna Fuse, MD  pantoprazole (PROTONIX) 40 MG tablet Take 1 tablet (40 mg total) by mouth daily. Patient not taking: Reported on 02/19/2022 08/21/21   Mayers, Loraine Grip, PA-C      Allergies    Patient  has no known allergies.    Review of Systems   Review of Systems  HENT:  Positive for dental problem. Negative for mouth sores.   Musculoskeletal:  Positive for arthralgias.    Physical Exam Updated Vital Signs BP (!) 135/90 (BP Location: Left Arm)   Pulse 62   Temp 99 F (37.2 C) (Oral)   Resp 16   Ht '5\' 4"'$  (1.626 m)   Wt 88.9 kg   LMP 12/04/2009   SpO2 99%   BMI 33.64 kg/m  Physical Exam Vitals and nursing note reviewed.  Constitutional:      General: She is not in acute distress.    Appearance:  She is well-developed.  HENT:     Head: Normocephalic and atraumatic.     Comments: Posterior airway appears patent.  Generally dentition is very poor.  Multiple teeth heavy rated down to the gumline in both the upper and lower jaw.  No areas or any active appearance of drainage.  No significant swelling noted but the patient does have tenderness to the submandibular region Eyes:     Conjunctiva/sclera: Conjunctivae normal.  Cardiovascular:     Rate and Rhythm: Normal rate and regular rhythm.     Heart sounds: No murmur heard. Pulmonary:     Effort: Pulmonary effort is normal. No respiratory distress.     Breath sounds: Normal breath sounds.  Abdominal:     Palpations: Abdomen is soft.     Tenderness: There is no abdominal tenderness.  Musculoskeletal:        General: No swelling.     Cervical back: Neck supple.  Skin:    General: Skin is warm and dry.     Capillary Refill: Capillary refill takes less than 2 seconds.  Neurological:     Mental Status: She is alert.  Psychiatric:        Mood and Affect: Mood normal.     ED Results / Procedures / Treatments   Labs (all labs ordered are listed, but only abnormal results are displayed) Labs Reviewed  BASIC METABOLIC PANEL    EKG None  Radiology No results found.  Procedures Procedures    Medications Ordered in ED Medications  HYDROcodone-acetaminophen (NORCO/VICODIN) 5-325 MG per tablet 1 tablet (1 tablet Oral Given 04/28/22 0834)  ketorolac (TORADOL) 30 MG/ML injection 30 mg (30 mg Intramuscular Given 04/28/22 7654)    ED Course/ Medical Decision Making/ A&P                           Medical Decision Making Amount and/or Complexity of Data Reviewed Labs: ordered. Radiology: ordered.  Risk Prescription drug management.   The patient presents with a chief complaint of jaw pain.  Differential includes but is not limited to dental abscess, dental caries, soft tissue infection, and others  I reviewed the  patient's past medical history including multiple emergency department visits over the past few years for dental pain.  I also reviewed previous shoulder imaging which showed no acute injury or abnormality  I see no indication at this time for lab work.  The patient's vitals are normal and she is not septic  I ordered and interpreted imaging including CT maxillofacial with contrast.  Patient refused CT and wanted to leave with antibiotics  I ordered the patient hydrocodone for pain.  Upon reassessment the patient had improved  Patient's nurse notified me the patient would like to leave at this time with antibiotics and does  not want to wait for CT.  Patient informed that I am unable to completely clear her of deeper soft tissue infection without CT.  Patient would still like to leave.  Patient states she understands the risk of deeper infection but would still like to leave.  I do feel that a deep infection is unlikely based on physical exam but due to mild swelling and tenderness in the submandibular region recommended the CT.  Patient prescribed Augmentin as she states that she has responded favorably to this in the past.  Patient instructed to follow-up with dentistry as soon as possible for evaluation.  Dental resources provided.  Patient given return precautions including shortness of breath or difficulty swallowing.        Final Clinical Impression(s) / ED Diagnoses Final diagnoses:  Pain, dental    Rx / DC Orders ED Discharge Orders          Ordered    amoxicillin-clavulanate (AUGMENTIN) 875-125 MG tablet  Every 12 hours        04/28/22 1047              Ronny Bacon 04/28/22 1050    Davonna Belling, MD 05/03/22 [phone]

## 2022-04-28 NOTE — ED Notes (Signed)
Jarrett Soho RN IV attempt unsuccessful. This RN will attempt IV start as soon as able.
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
## 2022-04-28 NOTE — Discharge Instructions (Signed)
You were seen today for pain that is likely dental in origin.  Please understand that without the CT scan I am unable to tell you with certainty if there is a deeper infection or not.  Please return to the emergency department if you develop difficulty breathing or significant difficulty swallowing.  Please take the prescribed antibiotics and complete the entire course.  I have provided dental resources.  Please contact a dentist as soon as possible for evaluation and further management
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
## 2022-05-15 ENCOUNTER — Other Ambulatory Visit: Payer: Self-pay

## 2022-05-15 ENCOUNTER — Other Ambulatory Visit: Payer: Self-pay | Admitting: Physician Assistant

## 2022-05-15 DIAGNOSIS — I10 Essential (primary) hypertension: Secondary | ICD-10-CM

## 2022-05-15 MED ORDER — LISINOPRIL-HYDROCHLOROTHIAZIDE 20-12.5 MG PO TABS
2.0000 | ORAL_TABLET | Freq: Every day | ORAL | 0 refills | Status: DC
  Filled 2022-05-15: qty 60, 30d supply, fill #0

## 2022-05-15 MED ORDER — AMLODIPINE BESYLATE 10 MG PO TABS
10.0000 mg | ORAL_TABLET | Freq: Every day | ORAL | 0 refills | Status: DC
  Filled 2022-05-15: qty 30, 30d supply, fill #0

## 2022-05-15 MED ORDER — METOPROLOL SUCCINATE ER 50 MG PO TB24
50.0000 mg | ORAL_TABLET | Freq: Every day | ORAL | 0 refills | Status: DC
  Filled 2022-05-15: qty 30, 30d supply, fill #0

## 2022-05-16 ENCOUNTER — Other Ambulatory Visit: Payer: Self-pay

## 2022-05-20 ENCOUNTER — Other Ambulatory Visit: Payer: Self-pay

## 2022-05-27 ENCOUNTER — Ambulatory Visit: Payer: Self-pay | Admitting: Family Medicine

## 2022-06-20 ENCOUNTER — Other Ambulatory Visit: Payer: Self-pay

## 2022-06-20 ENCOUNTER — Other Ambulatory Visit: Payer: Self-pay | Admitting: Physician Assistant

## 2022-06-20 ENCOUNTER — Other Ambulatory Visit: Payer: Self-pay | Admitting: Pharmacist

## 2022-06-20 DIAGNOSIS — I10 Essential (primary) hypertension: Secondary | ICD-10-CM

## 2022-06-20 MED ORDER — LISINOPRIL-HYDROCHLOROTHIAZIDE 20-12.5 MG PO TABS
2.0000 | ORAL_TABLET | Freq: Every day | ORAL | 0 refills | Status: DC
  Filled 2022-06-20: qty 60, 30d supply, fill #0

## 2022-06-20 MED ORDER — METOPROLOL SUCCINATE ER 50 MG PO TB24
50.0000 mg | ORAL_TABLET | Freq: Every day | ORAL | 0 refills | Status: DC
  Filled 2022-06-20: qty 30, 30d supply, fill #0

## 2022-06-20 MED ORDER — AMLODIPINE BESYLATE 10 MG PO TABS
10.0000 mg | ORAL_TABLET | Freq: Every day | ORAL | 0 refills | Status: DC
  Filled 2022-06-20: qty 30, 30d supply, fill #0

## 2022-07-16 ENCOUNTER — Encounter: Payer: Self-pay | Admitting: Physician Assistant

## 2022-07-16 ENCOUNTER — Other Ambulatory Visit: Payer: Self-pay

## 2022-07-16 ENCOUNTER — Ambulatory Visit: Payer: Self-pay | Attending: Physician Assistant | Admitting: Physician Assistant

## 2022-07-16 VITALS — BP 130/96 | HR 53 | Ht 64.0 in | Wt 199.0 lb

## 2022-07-16 DIAGNOSIS — K0889 Other specified disorders of teeth and supporting structures: Secondary | ICD-10-CM

## 2022-07-16 DIAGNOSIS — K2950 Unspecified chronic gastritis without bleeding: Secondary | ICD-10-CM

## 2022-07-16 DIAGNOSIS — Z23 Encounter for immunization: Secondary | ICD-10-CM

## 2022-07-16 DIAGNOSIS — F3112 Bipolar disorder, current episode manic without psychotic features, moderate: Secondary | ICD-10-CM

## 2022-07-16 DIAGNOSIS — I10 Essential (primary) hypertension: Secondary | ICD-10-CM

## 2022-07-16 DIAGNOSIS — G5603 Carpal tunnel syndrome, bilateral upper limbs: Secondary | ICD-10-CM

## 2022-07-16 DIAGNOSIS — M6740 Ganglion, unspecified site: Secondary | ICD-10-CM

## 2022-07-16 MED ORDER — LISINOPRIL-HYDROCHLOROTHIAZIDE 20-12.5 MG PO TABS
2.0000 | ORAL_TABLET | Freq: Every day | ORAL | 1 refills | Status: DC
  Filled 2022-07-16: qty 60, 30d supply, fill #0
  Filled 2022-08-20: qty 60, 30d supply, fill #1
  Filled 2022-09-18 (×2): qty 60, 30d supply, fill #2
  Filled 2022-10-16: qty 60, 30d supply, fill #3
  Filled 2022-11-14: qty 60, 30d supply, fill #4
  Filled 2022-12-19: qty 60, 30d supply, fill #5

## 2022-07-16 MED ORDER — GABAPENTIN 300 MG PO CAPS
300.0000 mg | ORAL_CAPSULE | Freq: Every day | ORAL | 1 refills | Status: DC
  Filled 2022-07-16: qty 90, 90d supply, fill #0
  Filled 2022-09-18: qty 30, 30d supply, fill #0
  Filled 2022-09-18: qty 90, 90d supply, fill #0
  Filled 2022-10-16: qty 30, 30d supply, fill #1

## 2022-07-16 MED ORDER — AMLODIPINE BESYLATE 10 MG PO TABS
10.0000 mg | ORAL_TABLET | Freq: Every day | ORAL | 1 refills | Status: DC
  Filled 2022-07-16: qty 30, 30d supply, fill #0
  Filled 2022-08-20: qty 30, 30d supply, fill #1
  Filled 2022-09-18 (×2): qty 30, 30d supply, fill #2
  Filled 2022-10-16: qty 30, 30d supply, fill #3
  Filled 2022-11-14: qty 30, 30d supply, fill #4
  Filled 2022-12-19: qty 30, 30d supply, fill #5

## 2022-07-16 MED ORDER — AMOXICILLIN-POT CLAVULANATE 875-125 MG PO TABS
1.0000 | ORAL_TABLET | Freq: Two times a day (BID) | ORAL | 0 refills | Status: DC
  Filled 2022-07-16: qty 14, 7d supply, fill #0

## 2022-07-16 MED ORDER — OMEPRAZOLE 20 MG PO CPDR
20.0000 mg | DELAYED_RELEASE_CAPSULE | Freq: Every day | ORAL | 3 refills | Status: DC
  Filled 2022-07-16: qty 30, 30d supply, fill #0

## 2022-07-16 MED ORDER — METOPROLOL SUCCINATE ER 50 MG PO TB24
50.0000 mg | ORAL_TABLET | Freq: Every day | ORAL | 1 refills | Status: DC
  Filled 2022-07-16: qty 30, 30d supply, fill #0
  Filled 2022-08-20: qty 30, 30d supply, fill #1
  Filled 2022-09-18 (×2): qty 30, 30d supply, fill #2
  Filled 2022-10-16: qty 30, 30d supply, fill #3
  Filled 2022-11-14: qty 30, 30d supply, fill #4
  Filled 2022-12-19: qty 30, 30d supply, fill #5

## 2022-07-16 NOTE — Progress Notes (Signed)
Patient received 0.5 ml IM injection in Left Deltoid today. Exp Date:06.30.2024,Lot #[id number]. Patient feeling well, with no adverse reactions.

## 2022-07-16 NOTE — Progress Notes (Signed)
[name], female   DOB: [birth year], 61 y.o.   MRN: [id number]   Jennifer [name], is a 61 y.o. female  IFO:[id number]  [id number]  DOB - 09/05/1960  Chief Complaint  Patient presents with   Medication mangement     Refills for Bp,        Subjective:   Jennifer [name] is a 61 y.o. female here today for multiple issues and to get meds refilled.  A small lump popped up on her L hand about 10 days ago.  Not painful.    B CTS and c/o pain that is worse at night.  Has tried wearing splints which helps temporarily.    Also has acid reflux and pantoprazole is not helping.  No melena/hematochezia or hematemesis.  She admits to eating late at night which makes it worse.    She has not applied for orange card or medicaid.  Wants flu shot.    Also c/o recurrent tooth infection.  She has been unable to see a dentist.    No problems updated.  ALLERGIES: No Known Allergies  PAST MEDICAL HISTORY: Past Medical History:  Diagnosis Date   bipolar    Bipolar 1 disorder ([place])    Depression    Fibroids    now s/p hysterectomy   Hypertension    Left ventricular hypertrophy    Polysubstance abuse ([place])    IV drug [place], cocaine on occassion, smoking and alcoholism, has been going to AA, not completely sober    MEDICATIONS AT HOME: Prior to Admission medications   Medication Sig Start Date End Date Taking? Authorizing Provider  atorvastatin (LIPITOR) 10 MG tablet Take 1 tablet (10 mg total) by mouth daily. 02/20/22  Yes Mayers, Cari S, PA-C  cetirizine (ZYRTEC) 10 MG tablet Take 1 tablet (10 mg total) by mouth daily. 09/10/21  Yes Mayers, Cari S, PA-C  hydrOXYzine (ATARAX) 25 MG tablet Take 1 tablet (25 mg total) by mouth at bedtime as needed. 08/21/21  Yes Mayers, Cari S, PA-C  ibuprofen (ADVIL) 800 MG tablet Take 1 tablet (800 mg total) by mouth 3 (three) times daily. 12/18/20  Yes Charlesetta Shanks, MD  naproxen (NAPROSYN) 375 MG tablet Take 1 tablet (375 mg total) by mouth 2  (two) times daily. 12/26/21  Yes Dorie Rank, MD  omeprazole (PRILOSEC) 20 MG capsule Take 1 capsule (20 mg total) by mouth daily. 07/16/22  Yes Cheryl Stabenow, Dionne Bucy, PA-C  ondansetron (ZOFRAN) 4 MG tablet Take 1 tablet (4 mg total) by mouth every 8 (eight) hours as needed for nausea or vomiting. 06/03/21  Yes Charlott Rakes, MD  oxyCODONE-acetaminophen (PERCOCET/ROXICET) 5-325 MG tablet Take 1 tablet by mouth every 6 (six) hours as needed for severe pain. 11/28/20  Yes Luna Fuse, MD  amLODipine (NORVASC) 10 MG tablet TAKE 1 TABLET (10 MG TOTAL) BY MOUTH DAILY (Office visit for future refills) 07/16/22   Argentina Donovan, PA-C  amoxicillin-clavulanate (AUGMENTIN) 875-125 MG tablet Take 1 tablet by mouth every 12 (twelve) hours. 07/16/22   Argentina Donovan, PA-C  gabapentin (NEURONTIN) 300 MG capsule Take 1 capsule (300 mg total) by mouth at bedtime. 07/16/22   Argentina Donovan, PA-C  lisinopril-hydrochlorothiazide (ZESTORETIC) 20-12.5 MG tablet Take 2 tablets by mouth daily. (Office visit for future refills) 07/16/22   Argentina Donovan, PA-C  metoprolol succinate (TOPROL-XL) 50 MG 24 hr tablet TAKE 1 TABLET (50 MG TOTAL) BY MOUTH DAILY. 07/16/22   Argentina Donovan, PA-C  ROS: Neg HEENT Neg resp Neg cardiac Neg GI Neg GU Neg MS Neg psych Neg neuro  Objective:   Vitals:   07/16/22 0927  BP: (!) 130/96  Pulse: (!) 53  SpO2: 97%  Weight: 199 lb (90.3 kg)  Height: '5\' 4"'$  (1.626 m)   Exam General appearance : Awake, alert, not in any distress. Speech Clear but pressured at times. Not toxic looking HEENT: Atraumatic and Normocephalic Neck: Supple, no JVD. No cervical lymphadenopathy.  Chest: Good air entry bilaterally, CTAB.  No rales/rhonchi/wheezing CVS: S1 S2 regular, no murmurs.  L hand with small mobile and discrete ganglion cyst <1cm btwn 4th and 5th metacarpal on dorsum of hand Extremities: B/L Lower Ext shows no edema, both legs are warm to touch Neurology: Awake alert, and  oriented X 3, CN II-XII intact, Non focal Skin: No Rash  Data Review Lab Results  Component Value Date   HGBA1C 5.8 02/06/2009    Assessment & Plan   1. Essential hypertension Controlled when on meds.  Labs from July and September reviewed - amLODipine (NORVASC) 10 MG tablet; TAKE 1 TABLET (10 MG TOTAL) BY MOUTH DAILY (Office visit for future refills)  Dispense: 90 tablet; Refill: 1 - lisinopril-hydrochlorothiazide (ZESTORETIC) 20-12.5 MG tablet; Take 2 tablets by mouth daily. (Office visit for future refills)  Dispense: 180 tablet; Refill: 1 - metoprolol succinate (TOPROL-XL) 50 MG 24 hr tablet; TAKE 1 TABLET (50 MG TOTAL) BY MOUTH DAILY.  Dispense: 90 tablet; Refill: 1  2. Bipolar disorder, current episode manic without psychotic features, moderate (HCC) - gabapentin (NEURONTIN) 300 MG capsule; Take 1 capsule (300 mg total) by mouth at bedtime.  Dispense: 90 capsule; Refill: 1  3. Other chronic gastritis without hemorrhage - omeprazole (PRILOSEC) 20 MG capsule; Take 1 capsule (20 mg total) by mouth daily.  Dispense: 30 capsule; Refill: 3  4. Tooth ache Gave information for dentisitry - amoxicillin-clavulanate (AUGMENTIN) 875-125 MG tablet; Take 1 tablet by mouth every 12 (twelve) hours.  Dispense: 14 tablet; Refill: 0  5. Bilateral carpal tunnel syndrome - gabapentin (NEURONTIN) 300 MG capsule; Take 1 capsule (300 mg total) by mouth at bedtime.  Dispense: 90 capsule; Refill: 1  6. Ganglion cyst Will refer once OC or financial is worked out.    Flu shot given    Return in about 5 months (around 12/15/2022) for assign PCP for chronic conditions.  The patient was given clear instructions to go to ER or return to medical center if symptoms don't improve, worsen or new problems develop. The patient verbalized understanding. The patient was told to call to get lab results if they haven't heard anything in the next week.      Freeman Caldron, PA-C [facility] and  [address] [place], [place]   07/16/2022, 9:51 AM

## 2022-07-16 NOTE — Patient Instructions (Signed)
Carpal Tunnel Syndrome  Carpal tunnel syndrome is a condition that causes pain, weakness, and numbness in your hand and arm. Numbness is when you cannot feel an area in your body. The carpal tunnel is a narrow area that is on the palm side of your wrist. Repeated wrist motion or certain diseases may cause swelling in the tunnel. This swelling can pinch the main nerve in the wrist. This nerve is called the median nerve. What are the causes? This condition may be caused by: Moving your hand and wrist over and over again while doing a task. Injury to the wrist. Arthritis. A sac of fluid (cyst) or abnormal growth (tumor) in the carpal tunnel. Fluid buildup during pregnancy. Use of tools that vibrate. Sometimes the cause is not known. What increases the risk? The following factors may make you more likely to have this condition: Having a job that makes you do these things: Move your hand over and over again. Work with tools that vibrate, such as drills or sanders. Being a woman. Having diabetes, obesity, thyroid problems, or kidney failure. What are the signs or symptoms? Symptoms of this condition include: A tingling feeling in your fingers. Tingling or loss of feeling in your hand. Pain in your entire arm. This pain may get worse when you bend your wrist and elbow for a long time. Pain in your wrist that goes up your arm to your shoulder. Pain that goes down into your palm or fingers. Weakness in your hands. You may find it hard to grab and hold items. You may feel worse at night. How is this treated? This condition may be treated with: Lifestyle changes. You will be asked to stop or change the activity that caused your problem. Doing exercises and activities that make bones, muscles, and tendons stronger (physical therapy). Learning how to use your hand again (occupational therapy). Medicines for pain and swelling. You may have injections in your wrist. A wrist splint or  brace. Surgery. Follow these instructions at home: If you have a splint or brace: Wear the splint or brace as told by your doctor. Take it off only as told by your doctor. Loosen the splint if your fingers: Tingle. Become numb. Turn cold and blue. Keep the splint or brace clean. If the splint or brace is not waterproof: Do not let it get wet. Cover it with a watertight covering when you take a bath or a shower. Managing pain, stiffness, and swelling If told, put ice on the painful area: If you have a removable splint or brace, remove it as told by your doctor. Put ice in a plastic bag. Place a towel between your skin and the bag. Leave the ice on for 20 minutes, 2-3 times per day. Do not fall asleep with the cold pack on your skin. Take off the ice if your skin turns bright red. This is very important. If you cannot feel pain, heat, or cold, you have a greater risk of damage to the area. Move your fingers often to reduce stiffness and swelling. General instructions Take over-the-counter and prescription medicines only as told by your doctor. Rest your wrist from any activity that may cause pain. If needed, talk with your boss at work about changes that can help your wrist heal. Do exercises as told by your doctor, physical therapist, or occupational therapist. Keep all follow-up visits. Contact a doctor if: You have new symptoms. Medicine does not help your pain. Your symptoms get worse. Get help right  away if: You have very bad numbness or tingling in your wrist or hand. Summary Carpal tunnel syndrome is a condition that causes pain in your hand and arm. It is often caused by repeated wrist motions. Lifestyle changes and medicines are used to treat this problem. Surgery may help in very bad cases. Follow your doctor's instructions about wearing a splint, resting your wrist, keeping follow-up visits, and calling for help. This information is not intended to replace advice given  to you by your health care provider. Make sure you discuss any questions you have with your health care provider. Document Revised: 12/08/2019 Document Reviewed: 12/08/2019 Elsevier Patient Education  [place]. Ganglion Cyst  A ganglion cyst is a non-cancerous, fluid-filled lump of tissue that occurs near a joint, tendon, or ligament. The cyst grows out of a joint or the lining of a tendon or ligament. Ganglion cysts most often develop in the hand or wrist, but they can also develop in the shoulder, elbow, hip, knee, ankle, or foot. Ganglion cysts are ball-shaped or egg-shaped. Their size can range from the size of a pea to larger than a grape. Increased activity may cause the cyst to get bigger because more fluid starts to build up. What are the causes? The exact cause of this condition is not known, but it may be related to: Inflammation or irritation around the joint. An injury or tear in the layers of tissue around the joint (joint capsule). Repetitive movements or overuse. History of acute or repeated injury. What increases the risk? You are more likely to develop this condition if: You are a female. You are 46-49 years old. What are the signs or symptoms? The main symptom of this condition is a lump. It most often appears on the hand or wrist. In many cases, there are no other symptoms, but a cyst can sometimes cause: Tingling. Pain or tenderness. Numbness. Weakness or loss of strength in the affected joint. Decreased range of motion in the affected area of the body. How is this diagnosed? Ganglion cysts are usually diagnosed based on a physical exam. Your health care provider will feel the lump and may shine a light next to it. If it is a ganglion cyst, the light will likely shine through it. Your health care provider may order an X-ray, ultrasound, MRI, or CT scan to rule out other conditions. How is this treated? Ganglion cysts often go away on their own without  treatment. If you have pain or other symptoms, treatment may be needed. Treatment is also needed if the ganglion cyst limits your movement or if it gets infected. Treatment may include: Wearing a brace or splint on your wrist or finger. Taking anti-inflammatory medicine. Having fluid drained from the lump with a needle (aspiration). Getting an injection of medicine into the joint to decrease inflammation. This may be corticosteroids, ethanol, or hyaluronidase. Having surgery to remove the ganglion cyst. Placing a pad in your shoe or wearing shoes that will not rub against the cyst if it is on your foot. Follow these instructions at home: Do not press on the ganglion cyst, poke it with a needle, or hit it. Take over-the-counter and prescription medicines only as told by your health care provider. If you have a brace or splint: Wear it as told by your health care provider. Remove it as told by your health care provider. Ask if you need to remove it when you take a shower or a bath. Watch your ganglion cyst for  any changes. Keep all follow-up visits as told by your health care provider. This is important. Contact a health care provider if: Your ganglion cyst becomes larger or more painful. You have pus coming from the lump. You have weakness or numbness in the affected area. You have a fever or chills. Get help right away if: You have a fever and have any of these in the cyst area: Increased redness. Red streaks. Swelling. Summary A ganglion cyst is a non-cancerous, fluid-filled lump that occurs near a joint, tendon, or ligament. Ganglion cysts most often develop in the hand or wrist, but they can also develop in the shoulder, elbow, hip, knee, ankle, or foot. Ganglion cysts often go away on their own without treatment. This information is not intended to replace advice given to you by your health care provider. Make sure you discuss any questions you have with your health care  provider. Document Revised: 10/19/2019 Document Reviewed: 10/19/2019 Elsevier Patient Education  [place].

## 2022-07-21 ENCOUNTER — Other Ambulatory Visit: Payer: Self-pay

## 2022-07-24 ENCOUNTER — Other Ambulatory Visit: Payer: Self-pay

## 2022-07-24 ENCOUNTER — Ambulatory Visit: Payer: Self-pay | Admitting: Family Medicine

## 2022-08-20 ENCOUNTER — Other Ambulatory Visit: Payer: Self-pay

## 2022-08-21 ENCOUNTER — Ambulatory Visit: Payer: Self-pay | Admitting: Family Medicine

## 2022-09-18 ENCOUNTER — Other Ambulatory Visit: Payer: Self-pay | Admitting: Pharmacist

## 2022-09-18 ENCOUNTER — Other Ambulatory Visit: Payer: Self-pay

## 2022-09-18 NOTE — Progress Notes (Signed)
Patient seen by Junius Finner, PharmD Candidate on 09/18/2022 while they were picking up prescriptions at [place] at [name].   Blood pressure today was : 138/86, HR 83. Of note, the patient was adamant against waiting 5 minutes before checking blood pressure, so it may be artificially high due to activity. Patient states she isn't feeling well and feels like she might be sick and she has a headache.    Patient does not have an automated home blood pressure machine. She states she is unable to afford a BP cuff. Discussed having someone reach out to her to receive one for free.    Medication review was performed. They are taking medications as prescribed. Differences from their prescribed list include: amlodipine, losartan/HCTZ, and metoprolol succinate.    The following barriers to adherence were noted:  - They do have cost concerns.  - They do have transportation concerns.  - They do not need assistance obtaining refills.  - They do occasionally forget to take some of their prescribed medications.  - They do not feel like one/some of their medications make them feel poorly.  - They do not have questions or concerns about their medications.  - They do not have follow up scheduled with their primary care provider/cardiologist.    The following interventions were completed:  - Medications were reviewed  - Patient was educated on medications, including indication and administration  - Patient was educated on adherence and using a pillbox  - Patient was educated on how to access home blood pressure machine  - Patient was educated on the importance of hydration with water and electrolytes    [place] [facility]  PharmD Candidate [phone]

## 2022-09-19 ENCOUNTER — Telehealth: Payer: Self-pay

## 2022-09-19 NOTE — Telephone Encounter (Signed)
..   Medicaid Managed Care   Unsuccessful Outreach Note  09/19/2022 Name: Jennifer [name] MRN: [id number] DOB: 1961-04-04  Referred by: Pcp, No Reason for referral : Appointment   An unsuccessful telephone outreach was attempted today. The patient was referred to the case management team for assistance with care management and care coordination.   Follow Up Plan: The care management team will reach out to the patient again over the next 7 days.   [place]

## 2022-09-26 ENCOUNTER — Telehealth: Payer: Self-pay

## 2022-09-26 NOTE — Telephone Encounter (Signed)
..   Medicaid Managed Care   Unsuccessful Outreach Note  09/26/2022 Name: Jennifer [name] MRN: [id number] DOB: [birth year]  Referred by: Pcp, No Reason for referral : Appointment   A second unsuccessful telephone outreach was attempted today. The patient was referred to the case management team for assistance with care management and care coordination.   Follow Up Plan: A HIPAA compliant phone message was left for the patient providing contact information and requesting a return call.  The care management team will reach out to the patient again over the next 7 days.   [place]

## 2022-09-30 ENCOUNTER — Telehealth: Payer: Self-pay

## 2022-09-30 NOTE — Telephone Encounter (Signed)
..   Medicaid Managed Care   Unsuccessful Outreach Note  09/30/2022 Name: Jennifer [name] MRN: [id number] DOB: [birth year]  Referred by: Pcp, No Reason for referral : Appointment   Third unsuccessful telephone outreach was attempted today. The patient was referred to the case management team for assistance with care management and care coordination. The patient's primary care provider has been notified of our unsuccessful attempts to make or maintain contact with the patient. The care management team is pleased to engage with this patient at any time in the future should he/she be interested in assistance from the care management team.   Follow Up Plan: We have been unable to make contact with the patient for follow up. The care management team is available to follow up with the patient after provider conversation with the patient regarding recommendation for care management engagement and subsequent re-referral to the care management team.   [place], [place]

## 2022-10-16 ENCOUNTER — Other Ambulatory Visit: Payer: Self-pay

## 2022-10-20 ENCOUNTER — Other Ambulatory Visit: Payer: Self-pay

## 2022-10-20 NOTE — Progress Notes (Signed)
Patient seen by Wallene Huh, PharmD Candidate on 10/20/2022 while they were picking up prescriptions at [place] at [facility].   Blood pressure today was : 158/104, HR 76; on recheck: 146/100, HR 74.  Patient reported they have been out of their BP medications for 3 days.   Patient does not have an automated home blood pressure machine.   Medication review was performed. They are not taking medications as prescribed. Differences from their prescribed list include: Patient did not remember having amlodipine on her medication list for blood pressure.   The following barriers to adherence were noted:  - They do have cost concerns.  - They do have transportation concerns.  - They do not need assistance obtaining refills.  - They do not occasionally forget to take some of their prescribed medications.  - They do not feel like one/some of their medications make them feel poorly.  - They do not have questions or concerns about their medications.  - They do not have follow up scheduled with their primary care provider/cardiologist.   The following interventions were completed:  - Medications were reviewed  - Patient was educated on how to access home blood pressure machine   The patient does not have follow up scheduled   [place].D Candidate   Joseph Art, Pharm.D. PGY-2 Ambulatory Care Pharmacy Resident

## 2022-11-14 ENCOUNTER — Other Ambulatory Visit: Payer: Self-pay | Admitting: Pharmacist

## 2022-11-14 ENCOUNTER — Other Ambulatory Visit: Payer: Self-pay

## 2022-11-14 NOTE — Progress Notes (Signed)
Clovis Pu, Student-PharmD  Jennifer [name], RPH-CPP Patient seen by Clovis Pu, PharmD Candidate on 11/14/2022 while they were picking up prescriptions at [facility] at [facility].  Blood pressure today was: 125/84, HR 54  Patient does not have an automated home blood pressure machine.   Medication review was performed. They are taking medications as prescribed.  The following barriers to adherence were noted: - They do not have cost concerns. - They do not have transportation concerns. - They do not need assistance obtaining refills. - They do not occasionally forget to take some of their prescribed medications. - They do not feel like one/some of their medications make them feel poorly. - They do not have questions or concerns about their medications. - They do not have follow up scheduled with their primary care provider/cardiologist.  The following interventions were completed: - Medications were reviewed - Patient was educated on goal blood pressures and long term health implications of elevated blood pressure - Patient was educated on use of adherence strategies, like a pill box or alarms - Patient was counseled on lifestyle modifications to improve blood pressure, including low sodium diet.     Clovis Pu, PharmD Candidate  Catie Eppie Gibson, PharmD, BCACP, CPP [facility] [phone]

## 2022-11-17 ENCOUNTER — Telehealth: Payer: Self-pay

## 2022-11-17 ENCOUNTER — Other Ambulatory Visit: Payer: Self-pay | Admitting: Pharmacist

## 2022-11-17 ENCOUNTER — Other Ambulatory Visit: Payer: Self-pay

## 2022-11-17 NOTE — Progress Notes (Signed)
Patient seen by Karlton Lemon, PharmD Candidate on 11/17/2022 while they were picking up prescriptions at [facility] at [facility].   Blood pressure today was : 122/84, HR 79.    Patient does not have an automated home blood pressure machine.   Medication review was performed. They are taking medications as prescribed, but report not taking medications for 2 days.   The following barriers to adherence were noted:  - They do not have cost concerns.  - They do not have transportation concerns.  - They do not need assistance obtaining refills.  - They do not occasionally forget to take some of their prescribed medications.  - They do not feel like one/some of their medications make them feel poorly.  - They do not have questions or concerns about their medications.  - They do not have follow up scheduled with their primary care provider/cardiologist.   The following interventions were completed:  - Medications were reviewed  - Patient was educated on goal blood pressures and long term health implications of elevated blood pressure  - Patient was educated on how to access home blood pressure machine  - Patient was counseled on benefit of tobacco cessation     Karlton Lemon  PharmD Candidate   Catie Eppie Gibson, PharmD, BCACP, CPP [facility] [phone]

## 2022-11-17 NOTE — Telephone Encounter (Signed)
Called pt upon referral from Catie Clearance Coots to schedule appt to establish care. Pt unavailable and will call back to schedule

## 2022-12-16 ENCOUNTER — Telehealth: Payer: Self-pay

## 2022-12-16 NOTE — Telephone Encounter (Signed)
Called pt to make a PCP appointment, but the person who answered the phone stated that pt is unavailable and she is not sure when a good time to call back would be.

## 2022-12-19 ENCOUNTER — Other Ambulatory Visit: Payer: Self-pay

## 2022-12-25 ENCOUNTER — Encounter: Payer: Self-pay | Admitting: Physician Assistant

## 2022-12-25 ENCOUNTER — Other Ambulatory Visit: Payer: Self-pay

## 2022-12-25 ENCOUNTER — Ambulatory Visit: Payer: Self-pay | Admitting: Physician Assistant

## 2022-12-25 VITALS — BP 127/82 | HR 56 | Ht 64.0 in | Wt 193.0 lb

## 2022-12-25 DIAGNOSIS — K219 Gastro-esophageal reflux disease without esophagitis: Secondary | ICD-10-CM

## 2022-12-25 DIAGNOSIS — E7800 Pure hypercholesterolemia, unspecified: Secondary | ICD-10-CM

## 2022-12-25 DIAGNOSIS — Z1231 Encounter for screening mammogram for malignant neoplasm of breast: Secondary | ICD-10-CM

## 2022-12-25 DIAGNOSIS — I10 Essential (primary) hypertension: Secondary | ICD-10-CM

## 2022-12-25 DIAGNOSIS — G5603 Carpal tunnel syndrome, bilateral upper limbs: Secondary | ICD-10-CM

## 2022-12-25 DIAGNOSIS — F17210 Nicotine dependence, cigarettes, uncomplicated: Secondary | ICD-10-CM

## 2022-12-25 DIAGNOSIS — G8929 Other chronic pain: Secondary | ICD-10-CM

## 2022-12-25 DIAGNOSIS — K047 Periapical abscess without sinus: Secondary | ICD-10-CM

## 2022-12-25 DIAGNOSIS — M25511 Pain in right shoulder: Secondary | ICD-10-CM

## 2022-12-25 DIAGNOSIS — F3112 Bipolar disorder, current episode manic without psychotic features, moderate: Secondary | ICD-10-CM

## 2022-12-25 DIAGNOSIS — R2231 Localized swelling, mass and lump, right upper limb: Secondary | ICD-10-CM

## 2022-12-25 MED ORDER — PENICILLIN V POTASSIUM 500 MG PO TABS
500.0000 mg | ORAL_TABLET | Freq: Three times a day (TID) | ORAL | 0 refills | Status: AC
  Filled 2022-12-25 – 2023-01-08 (×2): qty 30, 10d supply, fill #0

## 2022-12-25 MED ORDER — GABAPENTIN 300 MG PO CAPS
300.0000 mg | ORAL_CAPSULE | Freq: Every day | ORAL | 1 refills | Status: DC
  Filled 2022-12-25: qty 30, 30d supply, fill #0
  Filled 2023-02-13: qty 90, 90d supply, fill #0

## 2022-12-25 MED ORDER — METOPROLOL SUCCINATE ER 50 MG PO TB24
50.0000 mg | ORAL_TABLET | Freq: Every day | ORAL | 1 refills | Status: DC
  Filled 2022-12-25: qty 90, 90d supply, fill #0
  Filled 2023-01-28: qty 30, 30d supply, fill #0
  Filled 2023-03-31: qty 30, 30d supply, fill #1

## 2022-12-25 MED ORDER — LISINOPRIL-HYDROCHLOROTHIAZIDE 20-12.5 MG PO TABS
2.0000 | ORAL_TABLET | Freq: Every day | ORAL | 1 refills | Status: DC
  Filled 2022-12-25: qty 180, 90d supply, fill #0
  Filled 2023-01-28: qty 60, 30d supply, fill #0
  Filled 2023-03-31: qty 60, 30d supply, fill #1

## 2022-12-25 MED ORDER — ATORVASTATIN CALCIUM 10 MG PO TABS
10.0000 mg | ORAL_TABLET | Freq: Every day | ORAL | 1 refills | Status: DC
  Filled 2022-12-25 – 2023-02-13 (×2): qty 90, 90d supply, fill #0
  Filled 2023-03-31 – 2023-04-03 (×3): qty 30, 30d supply, fill #0

## 2022-12-25 MED ORDER — PANTOPRAZOLE SODIUM 40 MG PO TBEC
40.0000 mg | DELAYED_RELEASE_TABLET | Freq: Every day | ORAL | 1 refills | Status: DC
  Filled 2022-12-25 – 2023-02-13 (×2): qty 90, 90d supply, fill #0

## 2022-12-25 MED ORDER — ACETAMINOPHEN 325 MG PO TABS
650.0000 mg | ORAL_TABLET | Freq: Once | ORAL | Status: AC
  Administered 2022-12-25: 650 mg via ORAL

## 2022-12-25 NOTE — Patient Instructions (Signed)
We will call you with today's lab results.  I strongly encourage you to apply for Medicaid.  I will put the phone number for your aunt for the 2 referrals that were started today, One for the mammogram and one  for an ultrasound of your shoulder.  Please feel free to return to the mobile unit as needed  Roney Jaffe, PA-C Physician Assistant [facility] [email or web address]  Dental Abscess  A dental abscess is an infection around a tooth that may involve pain, swelling, and a collection of pus, as well as other symptoms. Treatment is important to help with symptoms and to prevent the infection from spreading. The general types of dental abscesses are: Pulpal abscess. This abscess may form from the inner part of the tooth (pulp). Periodontal abscess. This abscess may form from the gum. What are the causes? This condition is caused by a bacterial infection in or around the tooth. It may result from: Severe tooth decay (cavities). Trauma to the tooth, such as a broken or chipped tooth. What increases the risk? This condition is more likely to develop in males. It is also more likely to develop in people who: Have cavities. Have severe gum disease. Eat sugary snacks between meals. Use tobacco products. Have diabetes. Have a weakened disease-fighting system (immune system). Do not brush and care for their teeth regularly. What are the signs or symptoms? Mild symptoms of this condition include: Tenderness. Bad breath. Fever. A bitter taste in the mouth. Pain in and around the infected tooth. Moderate symptoms of this condition include: Swollen neck glands. Chills. Pus drainage. Swelling and redness around the infected tooth, in the mouth, or in the face. Severe pain in and around the infected tooth. Severe symptoms of this condition include: Difficulty swallowing. Difficulty opening the mouth. Nausea. Vomiting. How is  this diagnosed? This condition is diagnosed based on: Your symptoms and your medical and dental history. An examination of the infected tooth. During the exam, your dental care provider may tap on the infected tooth. You may also need to have X-rays taken of the affected area. How is this treated? This condition is treated by getting rid of the infection. This may be done with: Antibiotic medicines. These may be used in certain situations. Antibacterial mouth rinse. Incision and drainage. This procedure is done by making an incision in the abscess to drain out the pus. Removing pus is the first priority in treating an abscess. A root canal. This may be performed to save the tooth. Your dental care provider accesses the visible part of your tooth (crown) with a drill and removes any infected pulp. Then the space is filled and sealed off. Tooth extraction. The tooth is pulled out if it cannot be saved by other treatment. You may also receive treatment for pain, such as: Acetaminophen or NSAIDs. Gels that contain a numbing medicine. An injection to block the pain near your nerve. Follow these instructions at home: Medicines Take over-the-counter and prescription medicines only as told by your dental care provider. If you were prescribed an antibiotic, take it as told by your dental care provider. Do not stop taking the antibiotic even if you start to feel better. If you were prescribed a gel that contains a numbing medicine, use it exactly as told in the directions. Do not use these gels for children who are younger than 76 years of age. Use an antibacterial mouth rinse as told by your dental care provider. General instructions  Gargle  with a mixture of salt and water 3-4 times a day or as needed. To make salt water, completely dissolve -1 tsp (3-6 g) of salt in 1 cup (237 mL) of warm water. Eat a soft diet while your abscess is healing. Drink enough fluid to keep your urine pale yellow. Do  not apply heat to the outside of your mouth. Do not use any products that contain nicotine or tobacco. These products include cigarettes, chewing tobacco, and vaping devices, such as e-cigarettes. If you need help quitting, ask your dental care provider. Keep all follow-up visits. This is important. How is this prevented?  Excellent dental home care, which includes brushing your teeth every morning and night with fluoride toothpaste. Floss one time each day. Get regularly scheduled dental cleanings. Consider having a dental sealant applied on teeth that have deep grooves to prevent cavities. Drink fluoridated water regularly. This includes most tap water. Check the label on bottled water to see if it contains fluoride. Reduce or eliminate sugary drinks. Eat healthy meals and snacks. Wear a mouth guard or face shield to protect your teeth while playing sports. Contact a health care provider if: Your pain is worse and is not helped by medicine. You have swelling. You see pus around the tooth. You have a fever or chills. Get help right away if: Your symptoms suddenly get worse. You have a very bad headache. You have problems breathing or swallowing. You have trouble opening your mouth. You have swelling in your neck or around your eye. These symptoms may represent a serious problem that is an emergency. Do not wait to see if the symptoms will go away. Get medical help right away. Call your local emergency services (911 in the [place] Do not drive yourself to the hospital. Summary A dental abscess is a collection of pus in or around a tooth that results from an infection. A dental abscess may result from severe tooth decay, trauma to the tooth, or severe gum disease around a tooth. Symptoms include severe pain, swelling, redness, and drainage of pus in and around the infected tooth. The first priority in treating a dental abscess is to drain out the pus. Treatment may also involve removing  damage inside the tooth (root canal) or extracting the tooth. This information is not intended to replace advice given to you by your health care provider. Make sure you discuss any questions you have with your health care provider. Document Revised: 10/04/2020 Document Reviewed: 10/04/2020 Elsevier Patient Education  2023 [employer].

## 2022-12-25 NOTE — Progress Notes (Signed)
Established Patient Office Visit  Subjective   [name], female    DOB: [birth year]  Age: 62 y.o. MRN: [id number]  Chief Complaint  Patient presents with   Shoulder Pain    Right shoulder, [place] order from provider 02/2022. Patient did not go for imaging. Requesting to be rescheduled.   Jaw Pain    C/o infection in teeth and gums.     States that she continues to have pain in her right shoulder, states that the right shoulder mass continues to be present and she believes it is getting bigger.  Patient was seen in the mobile unit in July 2023 and an ultrasound was ordered for this complaint, unfortunately patient was unable to have that completed.  States that she continues to have pain in her lower right jaw from dental abscess.  States that she continues to be unable to afford dentistry.  Has not applied for Medicaid or [place] financial assistance.  Request refill of her blood pressure medications, request 90-day supplies.         Past Medical History:  Diagnosis Date   bipolar    Bipolar 1 disorder (HCC)    Depression    Fibroids    now s/p hysterectomy   Hypertension    Left ventricular hypertrophy    Polysubstance abuse (HCC)    IV drug [place], cocaine on occassion, smoking and alcoholism, has been going to AA, not completely sober   Social History   Socioeconomic History   Marital status: Single    Spouse name: Not on file   Number of children: 0   Years of education: Not on file   Highest education level: Some college, no degree  Occupational History   Not on file  Tobacco Use   Smoking status: Every Day    Packs/day: 1.00    Years: 20.00    Additional pack years: 0.00    Total pack years: 20.00    Types: Cigarettes   Smokeless tobacco: Never   Tobacco comments:    Wants to quit.   Vaping Use   Vaping Use: Never used  Substance and Sexual Activity   Alcohol use: Yes    Comment: occ.   Drug use: Not Currently    Types: Cocaine,  Marijuana   Sexual activity: Not Currently    Birth control/protection: Surgical  Other Topics Concern   Not on file  Social History Narrative   Financial assistance approved for 100% discount at [facility] and has [facility] card per Rudell Cobb   07/19/2010         Social Determinants of Health   Financial Resource Strain: Not on file  Food Insecurity: Food Insecurity Present (03/14/2021)   Hunger Vital Sign    Worried About Running Out of Food in the Last Year: Often true    Ran Out of Food in the Last Year: Often true  Transportation Needs: Unmet Transportation Needs (03/14/2021)   PRAPARE - Administrator, Civil Service (Medical): Yes    Lack of Transportation (Non-Medical): Yes  Physical Activity: Not on file  Stress: Not on file  Social Connections: Not on file  Intimate Partner Violence: Not on file   Family History  Problem Relation Age of Onset   Hypertension Mother    Hypertension Sister    Diabetes Father    No Known Allergies  Review of Systems  Constitutional:  Negative for chills and fever.  HENT: Negative.    Eyes:  Negative.   Respiratory:  Negative for shortness of breath.   Cardiovascular:  Negative for chest pain.  Gastrointestinal: Negative.   Genitourinary: Negative.   Musculoskeletal:  Positive for joint pain.  Skin: Negative.   Neurological: Negative.   Endo/Heme/Allergies: Negative.   Psychiatric/Behavioral: Negative.        Objective:     BP 127/82 (BP Location: Left Arm, Patient Position: Sitting, Cuff Size: Large)   Pulse (!) 56   Ht 5\' 4"  (1.626 m)   Wt 193 lb (87.5 kg)   LMP 12/04/2009   SpO2 96%   BMI 33.13 kg/m  BP Readings from Last 3 Encounters:  12/25/22 127/82  11/17/22 122/84  11/14/22 125/84   Wt Readings from Last 3 Encounters:  12/25/22 193 lb (87.5 kg)  07/16/22 199 lb (90.3 kg)  04/28/22 196 lb (88.9 kg)      Physical Exam Vitals and nursing note reviewed.  Constitutional:      Appearance: Normal  appearance.  HENT:     Head: Normocephalic.     Right Ear: External ear normal.     Left Ear: External ear normal.     Nose: Nose normal.     Mouth/Throat:     Mouth: Mucous membranes are moist.     Dentition: Abnormal dentition. Dental tenderness and dental abscesses present.     Comments: Lower right  Eyes:     Extraocular Movements: Extraocular movements intact.     Conjunctiva/sclera: Conjunctivae normal.     Pupils: Pupils are equal, round, and reactive to light.  Cardiovascular:     Rate and Rhythm: Normal rate and regular rhythm.     Pulses: Normal pulses.     Heart sounds: Normal heart sounds.  Pulmonary:     Effort: Pulmonary effort is normal.     Breath sounds: Normal breath sounds.  Musculoskeletal:        General: Swelling present.     Right shoulder: Swelling and tenderness present. Decreased range of motion.     Left shoulder: Normal.     Cervical back: Normal range of motion and neck supple.     Comments: See photos  Skin:    General: Skin is warm and dry.  Neurological:     General: No focal deficit present.     Mental Status: She is alert and oriented to person, place, and time.  Psychiatric:        Mood and Affect: Mood normal.        Behavior: Behavior normal.        Thought Content: Thought content normal.        Judgment: Judgment normal.           Assessment & Plan:   Problem List Items Addressed This Visit       Cardiovascular and Mediastinum   Essential hypertension - Primary   Relevant Medications   lisinopril-hydrochlorothiazide (ZESTORETIC) 20-12.5 MG tablet   metoprolol succinate (TOPROL-XL) 50 MG 24 hr tablet   atorvastatin (LIPITOR) 10 MG tablet   Other Relevant Orders   CBC with Differential/Platelet   Comp. Metabolic Panel (12)     Digestive   GERD (gastroesophageal reflux disease) (Chronic)   Relevant Medications   pantoprazole (PROTONIX) 40 MG tablet     Other   Bipolar disorder, current episode manic without  psychotic features, moderate (HCC) (Chronic)   Relevant Medications   gabapentin (NEURONTIN) 300 MG capsule   Mass of skin of right shoulder   Relevant Orders  US SOFT TISSUE RT UPPER EXTREMITY LTD (NON-VASCULAR)   Other Visit Diagnoses     Chronic right shoulder pain       Relevant Medications   gabapentin (NEURONTIN) 300 MG capsule   acetaminophen (TYLENOL) tablet 650 mg (Completed)   Dental abscess       Relevant Medications   penicillin v potassium (VEETID) 500 MG tablet   Bilateral carpal tunnel syndrome       Relevant Medications   gabapentin (NEURONTIN) 300 MG capsule   Elevated LDL cholesterol level       Relevant Medications   atorvastatin (LIPITOR) 10 MG tablet   Screening mammogram for breast cancer       Relevant Orders   MM DIGITAL SCREENING BILATERAL      1. Essential hypertension Continue current regimen.  Patient encouraged to continue checking blood pressure as able, keep a written log and have available for all office visits.  Patient encouraged to return to mobile unit as needed or follow-up with primary care provider.  Patient currently does not have cell phone, Phone [phone] (aunt's number - okay to leave message)  - lisinopril-hydrochlorothiazide (ZESTORETIC) 20-12.5 MG tablet; Take 2 tablets by mouth daily.  Dispense: 180 tablet; Refill: 1 - metoprolol succinate (TOPROL-XL) 50 MG 24 hr tablet; Take 1 tablet (50 mg total) by mouth daily.  Dispense: 90 tablet; Refill: 1 - CBC with Differential/Platelet - Comp. Metabolic Panel (12)  2. Chronic right shoulder pain Patient given Tylenol in clinic - acetaminophen (TYLENOL) tablet 650 mg  3. Mass of skin of right shoulder  - US SOFT TISSUE RT UPPER EXTREMITY LTD (NON-VASCULAR); Future  4. Dental abscess Trial penicillin.  Patient strongly encouraged to apply for Medicaid, patient states that she will follow-up at the [facility] on Monday to make the application. - penicillin v potassium (VEETID) 500 MG  tablet; Take 1 tablet (500 mg total) by mouth 3 (three) times daily for 10 days.  Dispense: 30 tablet; Refill: 0  5. Bilateral carpal tunnel syndrome Current regimen - gabapentin (NEURONTIN) 300 MG capsule; Take 1 capsule (300 mg total) by mouth at bedtime.  Dispense: 90 capsule; Refill: 1  6. Bipolar disorder,  without psychotic features, moderate (HCC) Continue current regimen - gabapentin (NEURONTIN) 300 MG capsule; Take 1 capsule (300 mg total) by mouth at bedtime.  Dispense: 90 capsule; Refill: 1  7. Gastroesophageal reflux disease without esophagitis Continue current regimen - pantoprazole (PROTONIX) 40 MG tablet; Take 1 tablet (40 mg total) by mouth daily.  Dispense: 90 tablet; Refill: 1  8. Elevated LDL cholesterol level Current regimen - atorvastatin (LIPITOR) 10 MG tablet; Take 1 tablet (10 mg total) by mouth daily.  Dispense: 90 tablet; Refill: 1  9. Screening mammogram for breast cancer Scholarship completed on patient's behalf - MM DIGITAL SCREENING BILATERAL; Future   I have reviewed the patient's medical history (PMH, PSH, Social History, Family History, Medications, and allergies) , and have been updated if relevant. I spent 30 minutes reviewing chart and  face to face time with patient.    Return if symptoms worsen or fail to improve.    Kasandra Knudsen Mayers, PA-C

## 2022-12-26 LAB — COMP. METABOLIC PANEL (12)
AST: 15 IU/L (ref 0–40)
Albumin/Globulin Ratio: 1.7 (ref 1.2–2.2)
Albumin: 4.1 g/dL (ref 3.8–4.9)
Alkaline Phosphatase: 63 IU/L (ref 44–121)
BUN/Creatinine Ratio: 22 (ref 9–23)
BUN: 17 mg/dL (ref 6–24)
Bilirubin Total: 0.3 mg/dL (ref 0.0–1.2)
Calcium: 9.5 mg/dL (ref 8.7–10.2)
Chloride: 108 mmol/L — ABNORMAL HIGH (ref 96–106)
Creatinine, Ser: 0.79 mg/dL (ref 0.57–1.00)
Globulin, Total: 2.4 g/dL (ref 1.5–4.5)
Glucose: 82 mg/dL (ref 70–99)
Potassium: 4.2 mmol/L (ref 3.5–5.2)
Sodium: 143 mmol/L (ref 134–144)
Total Protein: 6.5 g/dL (ref 6.0–8.5)
eGFR: 86 mL/min/{1.73_m2} (ref >59)

## 2022-12-26 LAB — CBC WITH DIFFERENTIAL/PLATELET
Basophils Absolute: 0 10*3/uL (ref 0.0–0.2)
Basos: 1 %
EOS (ABSOLUTE): 0.1 10*3/uL (ref 0.0–0.4)
Eos: 2 %
Hematocrit: 47.2 % — ABNORMAL HIGH (ref 34.0–46.6)
Hemoglobin: 15.1 g/dL (ref 11.1–15.9)
Immature Grans (Abs): 0 10*3/uL (ref 0.0–0.1)
Immature Granulocytes: 0 %
Lymphocytes Absolute: 1.5 10*3/uL (ref 0.7–3.1)
Lymphs: 37 %
MCH: 29.3 pg (ref 26.6–33.0)
MCHC: 32 g/dL (ref 31.5–35.7)
MCV: 92 fL (ref 79–97)
Monocytes Absolute: 0.3 10*3/uL (ref 0.1–0.9)
Monocytes: 6 %
Neutrophils Absolute: 2.2 10*3/uL (ref 1.4–7.0)
Neutrophils: 54 %
Platelets: 205 10*3/uL (ref 150–450)
RBC: 5.15 x10E6/uL (ref 3.77–5.28)
RDW: 14.1 % (ref 11.7–15.4)
WBC: 4.2 10*3/uL (ref 3.4–10.8)

## 2022-12-30 ENCOUNTER — Telehealth: Payer: Self-pay

## 2022-12-30 NOTE — Telephone Encounter (Signed)
Telephoned patient at home number. Left a message with person answering telephone.

## 2023-01-01 ENCOUNTER — Other Ambulatory Visit: Payer: Self-pay

## 2023-01-06 ENCOUNTER — Encounter: Payer: Self-pay | Admitting: Physician Assistant

## 2023-01-08 ENCOUNTER — Other Ambulatory Visit: Payer: Self-pay

## 2023-01-08 NOTE — Progress Notes (Signed)
   Jennifer [name] 1961/03/22 [id number]  Patient seen by Bing Plume , PharmD Candidate on 01/08/2023 at 9:45 AM.  Blood Pressure Readings: Last documented ambulatory systolic blood pressure: 127 Last documented ambulatory diastolic blood pressure: 82 Systolic BP today: 100 Diastolic BP today: 64 HR today: 54 Does the patient have a validated home blood pressure machine?: No   Medication review was not performed due to patient preference and time constraints. Is the patient taking their medications as prescribed?: No (Patient stated that she did not take her BP medications today. She also shared that she took her BP meds laste last night. Adherence prior to seeing the patient is unknown.)  The following barriers to adherence were noted: Does the patient have cost concerns?: Yes (Patient does not have any insurance coverage) Does the patient have transportation concerns?: No Does the patient need assistance obtaining refills?: No Does the patient occassionally forget to take some of their prescribed medications?: Yes Does the patient feel like one/some of their medications make them feel poorly?: No Does the patient have questions or concerns about their medications?: No Does the patient have a follow up scheduled with their primary care provider/cardiologist?: No (She only has an imaging appointment for her shoulders.)   Interventions: Interventions Completed: Patient was educated on goal blood pressures and long term health implications of elevated blood pressure, Patient was counseled on lifestyle modifications to improve blood pressure, including, Patient was counseled on benefit of tobacco cessation  Assessment: HTN -Patient states she recently began making dietary changes by taking out red meat from her diet.  -She states her usual BP is within goal when checked in [place] or other community pharmacies. She also shared that her systolic BP used to be within the 160 mmHg range.   -Patient shared that she took her medications (specificity unknown) the night before our encounter. She shared that she did not take any of her medications this morning.  -Her BP this morning is 100/64 mmHg. -She is currently on amlodipine 10 mg QD, lisinopril-HCTZ 20-12.5 mg 2 tabs QD, and metoprolol 50 mg QD. -She denies s/sx of hypotension. -No upcoming PCP visit.  -No insurance coverage. -Patient was in a hurry this morning, so we could not conduct a medication reconciliation and discuss ways to obtain a BP monitor. -Patient likely candidate for medication optimization. -Patient a candidate for home BP monitor.    The patient has follow up scheduled:  PCP: Pcp, No   Jennifer [name], Student-PharmD

## 2023-01-13 ENCOUNTER — Other Ambulatory Visit: Payer: Self-pay

## 2023-01-28 ENCOUNTER — Other Ambulatory Visit: Payer: Self-pay

## 2023-01-28 ENCOUNTER — Other Ambulatory Visit: Payer: Self-pay | Admitting: Physician Assistant

## 2023-01-28 DIAGNOSIS — I10 Essential (primary) hypertension: Secondary | ICD-10-CM

## 2023-01-28 NOTE — Progress Notes (Signed)
   Jennifer [name] 11-14-1960 [id number]  Patient seen by Alesia Banda , PharmD Candidate on 01/28/23.  Blood Pressure Readings: Systolic BP today: 148 Diastolic BP today: 91 HR today: 61 Systolic BP on recheck (if SBP >140 or DBP >90): 159 Diastolic BP on recheck (if SBP >140 or DBP >90): 94 HR on recheck: 66 Does the patient have a validated home blood pressure machine?: No Patient doesn't check BP at home  Medication review was performed. Is the patient taking their medications as prescribed?: No Differences from their prescribed list include: Been out of medications for the past 3 days. Picking up at pharmacy today.  The following barriers to adherence were noted: Does the patient have cost concerns?: Yes Does the patient have transportation concerns?: No Does the patient need assistance obtaining refills?: Yes Does the patient occassionally forget to take some of their prescribed medications?: Yes Does the patient feel like one/some of their medications make them feel poorly?: No Does the patient have questions or concerns about their medications?: No Does the patient have a follow up scheduled with their primary care provider/cardiologist?: No   Interventions: Interventions Completed: Medications were reviewed, Patient was educated on goal blood pressures and long term health implications of elevated blood pressure, Patient was educated on medications, including indication and administration, Patient was educated on how to access home blood pressure machine, Patient was counseled on lifestyle modifications to improve blood pressure, including  The patient has follow up scheduled:  PCP: Pcp, No   Alesia Banda, Student-PharmD

## 2023-02-03 ENCOUNTER — Encounter (HOSPITAL_COMMUNITY): Payer: Self-pay

## 2023-02-03 ENCOUNTER — Emergency Department (HOSPITAL_COMMUNITY)
Admission: EM | Admit: 2023-02-03 | Discharge: 2023-02-03 | Disposition: A | Discharge status: Home / Self Care | Payer: Self-pay | Attending: Emergency Medicine | Admitting: Emergency Medicine

## 2023-02-03 ENCOUNTER — Other Ambulatory Visit: Payer: Self-pay

## 2023-02-03 DIAGNOSIS — K029 Dental caries, unspecified: Secondary | ICD-10-CM | POA: Insufficient documentation

## 2023-02-03 DIAGNOSIS — K047 Periapical abscess without sinus: Secondary | ICD-10-CM | POA: Insufficient documentation

## 2023-02-03 MED ORDER — KETOROLAC TROMETHAMINE 30 MG/ML IJ SOLN
30.0000 mg | Freq: Once | INTRAMUSCULAR | Status: AC
  Administered 2023-02-03: 30 mg via INTRAMUSCULAR
  Filled 2023-02-03: qty 1

## 2023-02-03 MED ORDER — KETOROLAC TROMETHAMINE 10 MG PO TABS
10.0000 mg | ORAL_TABLET | Freq: Four times a day (QID) | ORAL | 0 refills | Status: DC | PRN
  Filled 2023-02-03: qty 20, 5d supply, fill #0

## 2023-02-03 MED ORDER — AMOXICILLIN-POT CLAVULANATE 875-125 MG PO TABS
1.0000 | ORAL_TABLET | Freq: Two times a day (BID) | ORAL | 0 refills | Status: DC
  Filled 2023-02-03: qty 20, 10d supply, fill #0

## 2023-02-03 MED ORDER — AMOXICILLIN-POT CLAVULANATE 875-125 MG PO TABS
1.0000 | ORAL_TABLET | Freq: Once | ORAL | Status: AC
  Administered 2023-02-03: 1 via ORAL
  Filled 2023-02-03: qty 1

## 2023-02-03 NOTE — ED Triage Notes (Signed)
Pt c/o recurrent bilateral dental, jaw, and ear pain x3 days.  Pain score 10/10.  Pt reports she just finished a course of penicillin. Pt reports broken teeth.  Sts she cannot afford a Education officer, community.

## 2023-02-03 NOTE — ED Provider Notes (Signed)
[place] EMERGENCY DEPARTMENT AT [facility] Provider Note   CSN: [id number] Arrival date & time: 02/03/23  0844     History  Chief Complaint  Patient presents with   Dental Pain   Otalgia    GINIA RUDELL is a 62 y.o. female, history of multiple dental infections, who presents to the ED secondary to bilateral tooth pain that is been going on for several weeks.  She states that she chronically has dental pain, from a bad feeling that happened several years ago.  Since then she has had some difficulty.  States she cannot afford a dentist, so that is why she has not gone to 1.  Denies any swelling of the face, fever, chills.  States that just very painful.  Pain radiates to bilateral ears.  She also states she has had some jaw pain with the dental pain.       Home Medications Prior to Admission medications   Medication Sig Start Date End Date Taking? Authorizing Provider  amoxicillin-clavulanate (AUGMENTIN) 875-125 MG tablet Take 1 tablet by mouth every 12 (twelve) hours. 02/03/23  Yes Kellan Boehlke L, PA  ketorolac (TORADOL) 10 MG tablet Take 1 tablet (10 mg total) by mouth every 6 (six) hours as needed. 02/03/23  Yes Theo Krumholz L, PA  amLODipine (NORVASC) 10 MG tablet TAKE 1 TABLET (10 MG TOTAL) BY MOUTH DAILY (Office visit for future refills) 07/16/22   Anders Simmonds, PA-C  atorvastatin (LIPITOR) 10 MG tablet Take 1 tablet (10 mg total) by mouth daily. 12/25/22   Mayers, Cari S, PA-C  cetirizine (ZYRTEC) 10 MG tablet Take 1 tablet (10 mg total) by mouth daily. 09/10/21   Mayers, Cari S, PA-C  gabapentin (NEURONTIN) 300 MG capsule Take 1 capsule (300 mg total) by mouth at bedtime. 12/25/22   Mayers, Cari S, PA-C  hydrOXYzine (ATARAX) 25 MG tablet Take 1 tablet (25 mg total) by mouth at bedtime as needed. 08/21/21   Mayers, Cari S, PA-C  ibuprofen (ADVIL) 800 MG tablet Take 1 tablet (800 mg total) by mouth 3 (three) times daily. 12/18/20   Arby Barrette, MD   lisinopril-hydrochlorothiazide (ZESTORETIC) 20-12.5 MG tablet Take 2 tablets by mouth daily. 12/25/22   Mayers, Cari S, PA-C  metoprolol succinate (TOPROL-XL) 50 MG 24 hr tablet Take 1 tablet (50 mg total) by mouth daily. 12/25/22   Mayers, Cari S, PA-C  naproxen (NAPROSYN) 375 MG tablet Take 1 tablet (375 mg total) by mouth 2 (two) times daily. 12/26/21   Linwood Dibbles, MD  ondansetron (ZOFRAN) 4 MG tablet Take 1 tablet (4 mg total) by mouth every 8 (eight) hours as needed for nausea or vomiting. 06/03/21   Hoy Register, MD  oxyCODONE-acetaminophen (PERCOCET/ROXICET) 5-325 MG tablet Take 1 tablet by mouth every 6 (six) hours as needed for severe pain. 11/28/20   Cheryll Cockayne, MD  pantoprazole (PROTONIX) 40 MG tablet Take 1 tablet (40 mg total) by mouth daily. 12/25/22   Mayers, Cari S, PA-C      Allergies    Patient has no known allergies.    Review of Systems   Review of Systems  Constitutional:  Negative for fever.  HENT:  Positive for dental problem and ear pain.     Physical Exam Updated Vital Signs BP (!) 144/98   Pulse 80   Temp 98.6 F (37 C) (Oral)   Resp 16   LMP 12/04/2009   SpO2 99%  Physical Exam Vitals and nursing note reviewed.  Constitutional:      General: She is not in acute distress.    Appearance: She is well-developed.  HENT:     Head: Normocephalic and atraumatic.     Right Ear: Tympanic membrane normal.     Left Ear: Tympanic membrane normal.     Nose: Nose normal.     Mouth/Throat:     Mouth: Mucous membranes are moist.     Comments: Multiple dental caries throughout the mouth, with no evidence of any kind of fluctuance, but does have erythema along the gumline.  No overt facial swelling, or erythema. Eyes:     General:        Right eye: No discharge.        Left eye: No discharge.     Conjunctiva/sclera: Conjunctivae normal.  Pulmonary:     Effort: No respiratory distress.  Musculoskeletal:     Cervical back: Normal range of motion and neck  supple.  Neurological:     Mental Status: She is alert.     Comments: Clear speech.   Psychiatric:        Behavior: Behavior normal.        Thought Content: Thought content normal.     ED Results / Procedures / Treatments   Labs (all labs ordered are listed, but only abnormal results are displayed) Labs Reviewed - No data to display  EKG None  Radiology No results found.  Procedures Procedures    Medications Ordered in ED Medications  ketorolac (TORADOL) 30 MG/ML injection 30 mg (has no administration in time range)  amoxicillin-clavulanate (AUGMENTIN) 875-125 MG per tablet 1 tablet (has no administration in time range)    ED Course/ Medical Decision Making/ A&P                             Medical Decision Making Patient is a 62 year old female, here for dental pain has been going on for the last several weeks, states is extremely painful.  She has been seen multiple times for this dental pain, and was recently on just plain penicillin.  We will discharge her with Augmentin, Toradol for pain control, and I provided her with a list of dentist.  Additionally we talked about going to [facility] for free dental care, and she voiced understanding.  I reached out to [facility], and they stated that given out of [place] care, they cannot provide any transportation resources.  I informed the patient she will need to have a relative take her to the dental clinic.  We discussed return precautions such as facial swelling, difficulty with speech, swelling of throat.  She voiced understanding discharged home I informed her that this will not be getting better and until she gets her teeth fixed.  Will be recurrent issue  Risk Prescription drug management.    Final Clinical Impression(s) / ED Diagnoses Final diagnoses:  Dental infection  Pain due to dental caries    Rx / DC Orders ED Discharge Orders          Ordered    amoxicillin-clavulanate (AUGMENTIN) 875-125 MG tablet  Every 12 hours         02/03/23 0928    ketorolac (TORADOL) 10 MG tablet  Every 6 hours PRN        02/03/23 0928              Garey Alleva, Harley [place], PA 02/03/23 0931    Jacalyn Lefevre, MD 02/03/23 [phone]

## 2023-02-03 NOTE — Discharge Instructions (Addendum)
Please follow-up with [name], you will need to go to [name], to receive your free dental care.

## 2023-02-12 ENCOUNTER — Emergency Department (HOSPITAL_COMMUNITY)
Admission: EM | Admit: 2023-02-12 | Discharge: 2023-02-12 | Disposition: A | Discharge status: Home / Self Care | Payer: Self-pay | Attending: Emergency Medicine | Admitting: Emergency Medicine

## 2023-02-12 ENCOUNTER — Encounter (HOSPITAL_COMMUNITY): Payer: Self-pay

## 2023-02-12 ENCOUNTER — Other Ambulatory Visit: Payer: Self-pay

## 2023-02-12 DIAGNOSIS — I10 Essential (primary) hypertension: Secondary | ICD-10-CM

## 2023-02-12 DIAGNOSIS — Z79899 Other long term (current) drug therapy: Secondary | ICD-10-CM | POA: Insufficient documentation

## 2023-02-12 DIAGNOSIS — R112 Nausea with vomiting, unspecified: Secondary | ICD-10-CM

## 2023-02-12 DIAGNOSIS — Z7982 Long term (current) use of aspirin: Secondary | ICD-10-CM | POA: Insufficient documentation

## 2023-02-12 MED ORDER — CLONIDINE HCL 0.1 MG PO TABS
0.1000 mg | ORAL_TABLET | Freq: Once | ORAL | Status: AC
  Administered 2023-02-12: 0.1 mg via ORAL
  Filled 2023-02-12: qty 1

## 2023-02-12 NOTE — ED Provider Notes (Signed)
[place] EMERGENCY DEPARTMENT AT [facility] Provider Note   CSN: [id number] Arrival date & time: 02/12/23  1402     History  Chief Complaint  Patient presents with   Dizziness   Hypertension    Jennifer [name] is a 62 y.o. female.  Patient is a 62 year old female with a history of hypertension, bipolar disease, polysubstance use, LVH who is on multiple blood pressure medications who is presenting today with complaints of nausea and vomiting and being concerned that her blood pressure is elevated.  Patient states since yesterday she has had vomiting several times today.  She does report that she vomits every morning related to her reflux but it has been more today.  She has had some mild heaviness in her chest after this.  She states she knows it is because her blood pressure is elevated.  She did take her medications this morning but stated still running high at home.  She denies any alcohol use but does say she is still smoking.  She has no abdominal pain and denies new cough or fever.  EMS did give her aspirin based on her symptoms she has had no vomiting with this.  They did note that her EKG was significantly abnormal.  The history is provided by the patient, medical records and the EMS personnel.  Dizziness Hypertension       Home Medications Prior to Admission medications   Medication Sig Start Date End Date Taking? Authorizing Provider  amLODipine (NORVASC) 10 MG tablet TAKE 1 TABLET (10 MG TOTAL) BY MOUTH DAILY (Office visit for future refills) 07/16/22   Anders Simmonds, PA-C  amoxicillin-clavulanate (AUGMENTIN) 875-125 MG tablet Take 1 tablet by mouth every 12 (twelve) hours. 02/03/23   Small, Brooke L, PA  atorvastatin (LIPITOR) 10 MG tablet Take 1 tablet (10 mg total) by mouth daily. 12/25/22   Mayers, Cari S, PA-C  cetirizine (ZYRTEC) 10 MG tablet Take 1 tablet (10 mg total) by mouth daily. 09/10/21   Mayers, Cari S, PA-C  gabapentin (NEURONTIN) 300 MG  capsule Take 1 capsule (300 mg total) by mouth at bedtime. 12/25/22   Mayers, Cari S, PA-C  hydrOXYzine (ATARAX) 25 MG tablet Take 1 tablet (25 mg total) by mouth at bedtime as needed. 08/21/21   Mayers, Cari S, PA-C  ibuprofen (ADVIL) 800 MG tablet Take 1 tablet (800 mg total) by mouth 3 (three) times daily. 12/18/20   Arby Barrette, MD  ketorolac (TORADOL) 10 MG tablet Take 1 tablet (10 mg total) by mouth every 6 (six) hours as needed. 02/03/23   Small, Brooke L, PA  lisinopril-hydrochlorothiazide (ZESTORETIC) 20-12.5 MG tablet Take 2 tablets by mouth daily. 12/25/22   Mayers, Cari S, PA-C  metoprolol succinate (TOPROL-XL) 50 MG 24 hr tablet Take 1 tablet (50 mg total) by mouth daily. 12/25/22   Mayers, Cari S, PA-C  naproxen (NAPROSYN) 375 MG tablet Take 1 tablet (375 mg total) by mouth 2 (two) times daily. 12/26/21   Linwood Dibbles, MD  ondansetron (ZOFRAN) 4 MG tablet Take 1 tablet (4 mg total) by mouth every 8 (eight) hours as needed for nausea or vomiting. 06/03/21   Hoy Register, MD  oxyCODONE-acetaminophen (PERCOCET/ROXICET) 5-325 MG tablet Take 1 tablet by mouth every 6 (six) hours as needed for severe pain. 11/28/20   Cheryll Cockayne, MD  pantoprazole (PROTONIX) 40 MG tablet Take 1 tablet (40 mg total) by mouth daily. 12/25/22   Mayers, Kasandra Knudsen, PA-C  Allergies    Patient has no known allergies.    Review of Systems   Review of Systems  Neurological:  Positive for dizziness.    Physical Exam Updated Vital Signs BP (!) 153/108   Temp (!) 100.4 F (38 C) (Oral)   LMP 12/04/2009  Physical Exam Vitals and nursing note reviewed.  Constitutional:      General: She is not in acute distress.    Appearance: She is well-developed.  HENT:     Head: Normocephalic and atraumatic.  Eyes:     Pupils: Pupils are equal, round, and reactive to light.  Cardiovascular:     Rate and Rhythm: Normal rate and regular rhythm.     Heart sounds: Normal heart sounds. No murmur heard.    No friction  rub.  Pulmonary:     Effort: Pulmonary effort is normal.     Breath sounds: Normal breath sounds. No wheezing or rales.  Abdominal:     General: Bowel sounds are normal. There is no distension.     Palpations: Abdomen is soft.     Tenderness: There is no abdominal tenderness. There is no guarding or rebound.  Musculoskeletal:        General: No tenderness. Normal range of motion.     Right lower leg: No edema.     Left lower leg: No edema.     Comments: No edema.  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     Findings: No rash.  Neurological:     Mental Status: She is alert and oriented to person, place, and time.     Cranial Nerves: No cranial nerve deficit.  Psychiatric:        Behavior: Behavior normal.     ED Results / Procedures / Treatments   Labs (all labs ordered are listed, but only abnormal results are displayed) Labs Reviewed - No data to display  EKG EKG Interpretation Date/Time:  Thursday February 12 2023 14:12:17 EDT Ventricular Rate:  59 PR Interval:  160 QRS Duration:  151 QT Interval:  464 QTC Calculation: 460 R Axis:   80  Text Interpretation: Sinus rhythm Probable left atrial enlargement Right bundle branch block LVH with IVCD and secondary repol abnrm No significant change since last tracing Confirmed by Gwyneth Sprout ([id number]) on 02/12/2023 2:14:58 PM  Radiology No results found.  Procedures Procedures    Medications Ordered in ED Medications  cloNIDine (CATAPRES) tablet 0.1 mg (has no administration in time range)    ED Course/ Medical Decision Making/ A&P                             Medical Decision Making Amount and/or Complexity of Data Reviewed External Data Reviewed: notes. ECG/medicine tests: ordered and independent interpretation performed. Decision-making details documented in ED Course.  Risk Prescription drug management.   Pt with multiple medical problems  and comorbidities and presenting today with a complaint that caries a high risk for morbidity and mortality.  Here today because she is having nausea vomiting, some intermittent chest pain and reports that her blood pressure has been elevated.  Patient became very upset with the nursing staff because she asked one of them to leave and they would not leave the room.  She reports she just wants care and she wants to receive the same respect as everybody else.  In  talking with the patient she does have concerning symptoms with nausea vomiting, intermittent mitten chest tightness.  This could be from her GERD however she has a significant cardiac history of hypertension on 4 different therapies a history of abnormal EKG without any cardiac intervention or evaluation in the past.  Patient does smoke.  Heart score of 4.  Patient has no abdominal pain and low suspicion for appendicitis, diverticulitis or perforation.  I independently interpreted patient's EKG which is abnormal with deep T wave inversion however is unchanged from EKG in 2018.  Patient is hypertensive here.  Patient at this time is just requesting medication for her blood pressure.  I offered to do blood work, x-ray and monitoring of the patient as well as medication for her nausea but she reports she just wants medicine for her blood work and she would prefer to follow-up with her doctor tomorrow she does not desire to stay here any longer.  Patient also noted to have a temperature of 100.4 however that is after coming from outside she is denying any infectious symptoms.  At this time patient is requesting discharge.  Did offer her if she changes her mind she can return.  Patient understands and reports she is ready to leave.  She has had clonidine in the past she was given a one-time dose here for her blood pressure and will follow-up with her doctor tomorrow for medication management.         Final Clinical Impression(s) / ED Diagnoses Final  diagnoses:  Uncontrolled hypertension  Nausea and vomiting, unspecified vomiting type    Rx / DC Orders ED Discharge Orders     None         Gwyneth Sprout, MD 02/12/23 1447

## 2023-02-12 NOTE — ED Triage Notes (Signed)
Pt BIB EMS due to pt stating she is hypertensive. C/O n/v/diziness and heaviness in chest. EMS gave 324mg  of aspirin.

## 2023-02-12 NOTE — Discharge Instructions (Signed)
Your blood pressure was elevated here today at 153/108.  You were given a dose of clonidine but you should continue to take your regular blood pressure medications.  However if you start getting chest pain, abdominal pain, shortness of breath, the vomiting continues and you want further evaluation we are happy to do blood work and IV as needed.

## 2023-02-12 NOTE — ED Notes (Signed)
Pt verbally abusive towards staff. MD at bedside

## 2023-02-12 NOTE — ED Notes (Signed)
Pt refusing all other VS

## 2023-02-12 NOTE — ED Notes (Signed)
Pt left without d/c paperwork and VS;

## 2023-02-13 ENCOUNTER — Other Ambulatory Visit: Payer: Self-pay | Admitting: Family Medicine

## 2023-02-13 ENCOUNTER — Other Ambulatory Visit: Payer: Self-pay

## 2023-02-13 ENCOUNTER — Other Ambulatory Visit: Payer: Self-pay | Admitting: Pharmacist

## 2023-02-13 ENCOUNTER — Other Ambulatory Visit: Payer: Self-pay | Admitting: Physician Assistant

## 2023-02-13 DIAGNOSIS — I10 Essential (primary) hypertension: Secondary | ICD-10-CM

## 2023-02-13 MED ORDER — AMLODIPINE BESYLATE 10 MG PO TABS
10.0000 mg | ORAL_TABLET | Freq: Every day | ORAL | 0 refills | Status: DC
  Filled 2023-02-13: qty 30, 30d supply, fill #0

## 2023-02-18 ENCOUNTER — Ambulatory Visit: Payer: Self-pay | Admitting: Family Medicine

## 2023-03-04 ENCOUNTER — Other Ambulatory Visit: Payer: Self-pay

## 2023-03-04 ENCOUNTER — Emergency Department (HOSPITAL_COMMUNITY)
Admission: EM | Admit: 2023-03-04 | Discharge: 2023-03-04 | Disposition: A | Discharge status: Home / Self Care | Payer: Self-pay | Attending: Emergency Medicine | Admitting: Emergency Medicine

## 2023-03-04 ENCOUNTER — Encounter (HOSPITAL_COMMUNITY): Payer: Self-pay | Admitting: Emergency Medicine

## 2023-03-04 DIAGNOSIS — I10 Essential (primary) hypertension: Secondary | ICD-10-CM | POA: Insufficient documentation

## 2023-03-04 DIAGNOSIS — Z79899 Other long term (current) drug therapy: Secondary | ICD-10-CM | POA: Insufficient documentation

## 2023-03-04 MED ORDER — IBUPROFEN 200 MG PO TABS
600.0000 mg | ORAL_TABLET | Freq: Once | ORAL | Status: AC
  Administered 2023-03-04: 600 mg via ORAL
  Filled 2023-03-04: qty 3

## 2023-03-04 MED ORDER — AMLODIPINE BESYLATE 10 MG PO TABS
10.0000 mg | ORAL_TABLET | Freq: Every day | ORAL | 0 refills | Status: DC
  Filled 2023-03-04 – 2023-03-06 (×2): qty 30, 30d supply, fill #0

## 2023-03-04 MED ORDER — AMLODIPINE BESYLATE 5 MG PO TABS
10.0000 mg | ORAL_TABLET | Freq: Every day | ORAL | Status: DC
  Administered 2023-03-04: 10 mg via ORAL
  Filled 2023-03-04: qty 2

## 2023-03-04 NOTE — ED Provider Notes (Signed)
[place] EMERGENCY DEPARTMENT AT [facility] Provider Note   CSN: [id number] Arrival date & time: 03/04/23  4098     History  Chief Complaint  Patient presents with   Hypertension    Jennifer [name] is a 62 y.o. female.   Hypertension  62 year old female history of hypertension, bipolar disorder, LVH, polysubstance use presenting for hypertension.  Patient states several days ago she ran her blood pressure medications.  She states that she takes lisinopril hydrochlorothiazide, amlodipine, metoprolol.  She feels like her blood pressure is high.  She has a mild frontal headache which she has had before.  It was not sudden onset, not associate with trauma.  No fevers or chills or neck stiffness.  No weakness or numbness or vision changes.  She is no chest pain or breathing.  No abdominal pain.  She has some occasional nausea and vomiting which is chronic and not changed.  She is tolerating p.o. without difficulty.  She would like a refill on her medications.  She also reports a growth over her right shoulder and request medication for this.  She states that is not new, no new trauma.     Home Medications Prior to Admission medications   Medication Sig Start Date End Date Taking? Authorizing Provider  amLODipine (NORVASC) 10 MG tablet TAKE 1 TABLET (10 MG TOTAL) BY MOUTH DAILY (Office visit for future refills) 03/04/23   Laurence Spates, MD  amoxicillin-clavulanate (AUGMENTIN) 875-125 MG tablet Take 1 tablet by mouth every 12 (twelve) hours. 02/03/23   Small, Brooke L, PA  atorvastatin (LIPITOR) 10 MG tablet Take 1 tablet (10 mg total) by mouth daily. 12/25/22   Mayers, Cari S, PA-C  cetirizine (ZYRTEC) 10 MG tablet Take 1 tablet (10 mg total) by mouth daily. 09/10/21   Mayers, Cari S, PA-C  gabapentin (NEURONTIN) 300 MG capsule Take 1 capsule (300 mg total) by mouth at bedtime. 12/25/22   Mayers, Cari S, PA-C  hydrOXYzine (ATARAX) 25 MG tablet Take 1 tablet (25 mg total) by  mouth at bedtime as needed. 08/21/21   Mayers, Cari S, PA-C  ibuprofen (ADVIL) 800 MG tablet Take 1 tablet (800 mg total) by mouth 3 (three) times daily. 12/18/20   Arby Barrette, MD  ketorolac (TORADOL) 10 MG tablet Take 1 tablet (10 mg total) by mouth every 6 (six) hours as needed. 02/03/23   Small, Brooke L, PA  lisinopril-hydrochlorothiazide (ZESTORETIC) 20-12.5 MG tablet Take 2 tablets by mouth daily. 12/25/22   Mayers, Cari S, PA-C  metoprolol succinate (TOPROL-XL) 50 MG 24 hr tablet Take 1 tablet (50 mg total) by mouth daily. 12/25/22   Mayers, Cari S, PA-C  naproxen (NAPROSYN) 375 MG tablet Take 1 tablet (375 mg total) by mouth 2 (two) times daily. 12/26/21   Linwood Dibbles, MD  ondansetron (ZOFRAN) 4 MG tablet Take 1 tablet (4 mg total) by mouth every 8 (eight) hours as needed for nausea or vomiting. 06/03/21   Hoy Register, MD  oxyCODONE-acetaminophen (PERCOCET/ROXICET) 5-325 MG tablet Take 1 tablet by mouth every 6 (six) hours as needed for severe pain. 11/28/20   Cheryll Cockayne, MD  pantoprazole (PROTONIX) 40 MG tablet Take 1 tablet (40 mg total) by mouth daily. 12/25/22   Mayers, Cari S, PA-C      Allergies    Patient has no known allergies.    Review of Systems   Review of Systems Review of systems completed and notable as per HPI.  ROS otherwise negative.  Physical Exam Updated Vital Signs BP (!) 154/95 (BP Location: Left Arm)   Pulse 74   Temp 98.6 F (37 C) (Oral)   Resp 18   Ht 5\' 4"  (1.626 m)   Wt 87.5 kg   LMP 12/04/2009   SpO2 97%   BMI 33.11 kg/m  Physical Exam Vitals and nursing note reviewed.  Constitutional:      General: She is not in acute distress.    Appearance: She is well-developed.  HENT:     Head: Normocephalic and atraumatic.     Nose: Nose normal. No congestion.     Mouth/Throat:     Mouth: Mucous membranes are moist.     Pharynx: Oropharynx is clear.  Eyes:     Extraocular Movements: Extraocular movements intact.     Conjunctiva/sclera:  Conjunctivae normal.     Pupils: Pupils are equal, round, and reactive to light.  Cardiovascular:     Rate and Rhythm: Normal rate and regular rhythm.     Heart sounds: No murmur heard. Pulmonary:     Effort: Pulmonary effort is normal. No respiratory distress.     Breath sounds: Normal breath sounds.  Abdominal:     Palpations: Abdomen is soft.     Tenderness: There is no abdominal tenderness.  Musculoskeletal:        General: No swelling.     Cervical back: Neck supple.     Right lower leg: No edema.     Left lower leg: No edema.     Comments: Soft, non-tender growth over the right deltoid.  No skin changes.  Appears to be lipoma.  Good range of motion of the shoulder without pain.  Skin:    General: Skin is warm and dry.     Capillary Refill: Capillary refill takes less than 2 seconds.  Neurological:     General: No focal deficit present.     Mental Status: She is alert and oriented to person, place, and time. Mental status is at baseline.     Cranial Nerves: No cranial nerve deficit.     Sensory: No sensory deficit.     Motor: No weakness.     Coordination: Coordination normal.     Gait: Gait normal.     Deep Tendon Reflexes: Reflexes normal.  Psychiatric:        Mood and Affect: Mood normal.     ED Results / Procedures / Treatments   Labs (all labs ordered are listed, but only abnormal results are displayed) Labs Reviewed - No data to display  EKG None  Radiology No results found.  Procedures Procedures    Medications Ordered in ED Medications  amLODipine (NORVASC) tablet 10 mg (has no administration in time range)    ED Course/ Medical Decision Making/ A&P                             Medical Decision Making  Medical Decision Making:   Jennifer [name] is a 62 y.o. female who presented to the ED today with hypertension and request for medication refill.  Vital signs notable for mild hypertension.  She is well-appearing.  She reports mild frontal  headache which she has had before, not acute onset, no associated trauma.  Low concern for subarachnoid hemorrhage, intracranial mass, meningitis, or other dangerous cause for her headache.  She has normal neurologic exam.  She reports some chronic nausea and vomiting which is unchanged and not  worse.  She is tolerating p.o. and appears well-hydrated.  Given she is essentially asymptomatic I do not think further workup at this time is indicated.  She is no chest pain did get ACS workup.  No signs of hypertensive emergency.  I reviewed her medications, her history is somewhat inconsistent in the chart as to what she has supposed to be taking.  Given she is only mildly hypertensive today after running out of medication several days ago we will refill her amlodipine and give her dose here.  She states she will follow-up with her clinic later today.  Encouraged her to follow close with her PCP for reevaluation of her blood pressure.  She was comfortable this plan.  Discharged in stable condition.  She does have small nontender growth over her shoulder which appears to be a lipoma.  It is not new or acutely worsened.  No signs of infection.  Recommend PCP follow-up.  Patient's presentation is most consistent with exacerbation of chronic illness.           Final Clinical Impression(s) / ED Diagnoses Final diagnoses:  Hypertension, unspecified type    Rx / DC Orders ED Discharge Orders          Ordered    amLODipine (NORVASC) 10 MG tablet  Daily        03/04/23 1046              Laurence Spates, MD 03/04/23 1046

## 2023-03-04 NOTE — ED Triage Notes (Signed)
Pt ran out of HTN meds 3 days ago. Endorses headache and vomiting. States she throws up every night which is her normal. Reports she does not have a PCP.

## 2023-03-04 NOTE — ED Notes (Signed)
Patient left before discharge paperwork could be given.

## 2023-03-04 NOTE — Discharge Instructions (Addendum)
You should follow-up with your doctor about your blood pressure.  You should call them today to make an appointment.  If you develop severe headache, weakness, numbness, vision changes, chest pain or any other new concerning symptoms you should return to the ED.

## 2023-03-06 ENCOUNTER — Other Ambulatory Visit: Payer: Self-pay

## 2023-03-24 ENCOUNTER — Emergency Department (HOSPITAL_COMMUNITY)
Admission: EM | Admit: 2023-03-24 | Discharge: 2023-03-24 | Discharge status: Against Medical Advice | Payer: Self-pay | Attending: Emergency Medicine | Admitting: Emergency Medicine

## 2023-03-24 DIAGNOSIS — I10 Essential (primary) hypertension: Secondary | ICD-10-CM | POA: Insufficient documentation

## 2023-03-24 DIAGNOSIS — Z5321 Procedure and treatment not carried out due to patient leaving prior to being seen by health care provider: Secondary | ICD-10-CM | POA: Insufficient documentation

## 2023-03-24 DIAGNOSIS — Z760 Encounter for issue of repeat prescription: Secondary | ICD-10-CM | POA: Insufficient documentation

## 2023-03-24 NOTE — ED Notes (Signed)
After receiving vital signs and pt saw BP decided to leave and call her Dr for blood pressure refill. Pt stated only here because thought BP was elevated.

## 2023-03-31 ENCOUNTER — Other Ambulatory Visit: Payer: Self-pay

## 2023-03-31 ENCOUNTER — Emergency Department (HOSPITAL_COMMUNITY)
Admission: EM | Admit: 2023-03-31 | Discharge: 2023-03-31 | Disposition: A | Discharge status: Home / Self Care | Payer: Self-pay | Attending: Emergency Medicine | Admitting: Emergency Medicine

## 2023-03-31 DIAGNOSIS — K029 Dental caries, unspecified: Secondary | ICD-10-CM | POA: Insufficient documentation

## 2023-03-31 DIAGNOSIS — I10 Essential (primary) hypertension: Secondary | ICD-10-CM | POA: Insufficient documentation

## 2023-03-31 DIAGNOSIS — R030 Elevated blood-pressure reading, without diagnosis of hypertension: Secondary | ICD-10-CM

## 2023-03-31 DIAGNOSIS — Z79899 Other long term (current) drug therapy: Secondary | ICD-10-CM | POA: Insufficient documentation

## 2023-03-31 MED ORDER — AMLODIPINE BESYLATE 10 MG PO TABS
10.0000 mg | ORAL_TABLET | Freq: Every day | ORAL | 1 refills | Status: DC
  Filled 2023-03-31: qty 30, 30d supply, fill #0

## 2023-03-31 MED ORDER — PENICILLIN V POTASSIUM 500 MG PO TABS
500.0000 mg | ORAL_TABLET | Freq: Once | ORAL | Status: AC
  Administered 2023-03-31: 500 mg via ORAL
  Filled 2023-03-31: qty 1

## 2023-03-31 MED ORDER — PENICILLIN V POTASSIUM 500 MG PO TABS
500.0000 mg | ORAL_TABLET | Freq: Four times a day (QID) | ORAL | 0 refills | Status: AC
  Filled 2023-03-31: qty 28, 7d supply, fill #0

## 2023-03-31 MED ORDER — KETOROLAC TROMETHAMINE 15 MG/ML IJ SOLN
15.0000 mg | Freq: Once | INTRAMUSCULAR | Status: AC
  Administered 2023-03-31: 15 mg via INTRAMUSCULAR
  Filled 2023-03-31: qty 1

## 2023-03-31 MED ORDER — AMLODIPINE BESYLATE 5 MG PO TABS
10.0000 mg | ORAL_TABLET | Freq: Once | ORAL | Status: AC
  Administered 2023-03-31: 10 mg via ORAL
  Filled 2023-03-31: qty 2

## 2023-03-31 NOTE — Discharge Instructions (Addendum)
You have refills of lisinopril and metoprolol at your pharmacy.  You will be sent a prescription for refill of your amlodipine, take as directed.  Follow-up with your primary care provider regarding today's ED visit.   You will be prescribed Penicillin, take as directed and ensure to complete the entire course of the antibiotic. You may take over the counter 600 mg Ibuprofen every 6 hours and alternate with 500  mg Tylenol every 6 hours as directed for no more than 7 days.  Attached you will find a resource guide for dentists in the area, call and set up a follow up appointment. Return to the Emergency Department if you are experiencing increasing/worsening symptoms.

## 2023-03-31 NOTE — ED Provider Notes (Signed)
[place] EMERGENCY DEPARTMENT AT [facility] Provider Note   CSN: [id number] Arrival date & time: 03/31/23  1039     History  Chief Complaint  Patient presents with   Dental Pain   Facial Swelling    Jennifer [name] is a 62 y.o. female with a past medical history of hypertension, polysubstance abuse, bipolar 1 disorder, who presents emergency department with concerns for right-sided facial swelling onset 2-3 days.  Has associated jaw pain, right lower dental pain.  Also notes that she is out of her blood pressure medications however she is unable to afford them.  She has not contacted her primary care provider about this.  The history is provided by the patient. No language interpreter was used.       Home Medications Prior to Admission medications   Medication Sig Start Date End Date Taking? Authorizing Provider  penicillin v potassium (VEETID) 500 MG tablet Take 1 tablet (500 mg total) by mouth 4 (four) times daily for 7 days. 03/31/23 04/07/23 Yes Debra Calabretta A, PA-C  amLODipine (NORVASC) 10 MG tablet TAKE 1 TABLET (10 MG TOTAL) BY MOUTH DAILY (Office visit for future refills) 03/31/23   Scorpio Fortin A, PA-C  atorvastatin (LIPITOR) 10 MG tablet Take 1 tablet (10 mg total) by mouth daily. 12/25/22   Mayers, Cari S, PA-C  cetirizine (ZYRTEC) 10 MG tablet Take 1 tablet (10 mg total) by mouth daily. 09/10/21   Mayers, Cari S, PA-C  gabapentin (NEURONTIN) 300 MG capsule Take 1 capsule (300 mg total) by mouth at bedtime. 12/25/22   Mayers, Cari S, PA-C  hydrOXYzine (ATARAX) 25 MG tablet Take 1 tablet (25 mg total) by mouth at bedtime as needed. 08/21/21   Mayers, Cari S, PA-C  ibuprofen (ADVIL) 800 MG tablet Take 1 tablet (800 mg total) by mouth 3 (three) times daily. 12/18/20   Arby Barrette, MD  ketorolac (TORADOL) 10 MG tablet Take 1 tablet (10 mg total) by mouth every 6 (six) hours as needed. 02/03/23   Small, Brooke L, PA  lisinopril-hydrochlorothiazide (ZESTORETIC)  20-12.5 MG tablet Take 2 tablets by mouth daily. 12/25/22   Mayers, Cari S, PA-C  metoprolol succinate (TOPROL-XL) 50 MG 24 hr tablet Take 1 tablet (50 mg total) by mouth daily. 12/25/22   Mayers, Cari S, PA-C  naproxen (NAPROSYN) 375 MG tablet Take 1 tablet (375 mg total) by mouth 2 (two) times daily. 12/26/21   Linwood Dibbles, MD  ondansetron (ZOFRAN) 4 MG tablet Take 1 tablet (4 mg total) by mouth every 8 (eight) hours as needed for nausea or vomiting. 06/03/21   Hoy Register, MD  oxyCODONE-acetaminophen (PERCOCET/ROXICET) 5-325 MG tablet Take 1 tablet by mouth every 6 (six) hours as needed for severe pain. 11/28/20   Cheryll Cockayne, MD  pantoprazole (PROTONIX) 40 MG tablet Take 1 tablet (40 mg total) by mouth daily. 12/25/22   Mayers, Cari S, PA-C      Allergies    Patient has no known allergies.    Review of Systems   Review of Systems  All other systems reviewed and are negative.   Physical Exam Updated Vital Signs BP (!) 173/106   Pulse 74   Temp 98.7 F (37.1 C) (Oral)   Resp 18   LMP 12/04/2009   SpO2 99%  Physical Exam Vitals and nursing note reviewed.  Constitutional:      General: She is not in acute distress.    Appearance: Normal appearance. She is not ill-appearing.  HENT:     Head: Normocephalic and atraumatic.     Comments: No appreciable facial swelling noted.    Right Ear: Tympanic membrane, ear canal and external ear normal.     Left Ear: Tympanic membrane, ear canal and external ear normal.     Nose: Nose normal.     Mouth/Throat:     Mouth: Mucous membranes are moist. Mucous membranes are not dry.     Dentition: Abnormal dentition. Dental tenderness and dental caries present.     Tongue: Tongue does not deviate from midline.     Pharynx: Oropharynx is clear. Uvula midline. No oropharyngeal exudate or posterior oropharyngeal erythema.     Tonsils: No tonsillar exudate or tonsillar abscesses.     Comments: Multiple dental caries noted throughout. Tenderness to  palpation to right lower gum line. Teeth broken down to the gum line. No fluctuance noted. No trismus. No retropharyngeal abscess. No peritonsillar abscess noted. Uvula midline without swelling. No posterior pharyngeal erythema or tonsillar exudate noted. Patent airway. Pt able to speak in clear complete sentences. Tolerating oral secretions. Eyes:     General: No scleral icterus.    Extraocular Movements: Extraocular movements intact.  Cardiovascular:     Rate and Rhythm: Normal rate and regular rhythm.     Pulses: Normal pulses.     Heart sounds: Normal heart sounds.  Pulmonary:     Effort: Pulmonary effort is normal. No respiratory distress.     Breath sounds: Normal breath sounds.  Abdominal:     General: Bowel sounds are normal. There is no distension.     Palpations: Abdomen is soft. There is no mass.     Tenderness: There is no abdominal tenderness.  Musculoskeletal:        General: Normal range of motion.     Cervical back: Neck supple.  Lymphadenopathy:     Head:     Right side of head: No submental, submandibular, tonsillar, preauricular or posterior auricular adenopathy.     Left side of head: No submental, submandibular, tonsillar, preauricular or posterior auricular adenopathy.     Cervical: No cervical adenopathy.  Skin:    General: Skin is warm and dry.     Findings: No rash.  Neurological:     Mental Status: She is alert.     Sensory: Sensation is intact.     Motor: Motor function is intact.  Psychiatric:        Behavior: Behavior normal.     ED Results / Procedures / Treatments   Labs (all labs ordered are listed, but only abnormal results are displayed) Labs Reviewed - No data to display  EKG None  Radiology No results found.  Procedures Procedures    Medications Ordered in ED Medications  ketorolac (TORADOL) 15 MG/ML injection 15 mg (15 mg Intramuscular Given 03/31/23 1145)  penicillin v potassium (VEETID) tablet 500 mg (500 mg Oral Given 03/31/23  1145)  amLODipine (NORVASC) tablet 10 mg (10 mg Oral Given 03/31/23 1145)    ED Course/ Medical Decision Making/ A&P Clinical Course as of 03/31/23 1157  Tue Mar 31, 2023  1113 Discussed with patient that she has refills of her antihypertensives at the pharmacy.  Will send refill of her amlodipine.  Also instructed patient to follow-up with her primary care provider regarding today's ED visit. Blood pressure recheck in the ED at 150/99 [SB]    Clinical Course User Index [SB] Haziel Molner A, PA-C  Medical Decision Making Risk Prescription drug management.   Pt presents with right lower dental pain onset 2-3 days.  History of similar.  Does not have a dentist.  Also notes that she has been out of her antihypertensives. Vital signs, pt afebrile. On exam, pt with Multiple dental caries noted throughout. Tenderness to palpation to right lower gum line. Teeth broken down to the gum line. No fluctuance noted. No trismus. No retropharyngeal abscess. No peritonsillar abscess noted. Uvula midline without swelling. No posterior pharyngeal erythema or tonsillar exudate noted. Patent airway. Pt able to speak in clear complete sentences. Tolerating oral secretions.  No appreciable facial swelling noted no acute cardiovascular, respiratory, abdominal exam findings. Differential diagnosis includes dentalgia, dental abscess, Ludwick's, PTA.    Co morbidities that complicate the patient evaluation: Hypertension, polysubstance abuse, bipolar 1 disorder  Medications:  I ordered medication including penicillin, Toradol, amlodipine for symptom management and blood pressure treatment I have reviewed the patients home medicines and have made adjustments as needed   Disposition: Presenting suspicious for dentalgia.  All concerns at this time for dental abscess, Ludwick's, PTA.  Also concern for elevated blood pressure today.  Patient notes that she has been out of her  antihypertensives.  Requesting refill.  Discussed with patient at bedside that she has refills of her lisinopril and metoprolol already at the pharmacy.  Will send refill of her amlodipine.  Stressed importance with patient to follow-up with her primary care provider regarding today's ED visit.  Patient sent with a prescription for penicillin.  Patient provided with dental resources for follow-up. After consideration of the diagnostic results and the patients response to treatment, I feel that the patient would benefit from Discharge home. Supportive care measures and strict return precautions discussed with patient at bedside. Pt acknowledges and verbalizes understanding. Pt appears safe for discharge. Follow up as indicated in discharge paperwork.    This chart was dictated using voice recognition software, Dragon. Despite the best efforts of this provider to proofread and correct errors, errors may still occur which can change documentation meaning.  Final Clinical Impression(s) / ED Diagnoses Final diagnoses:  Pain due to dental caries  Elevated blood pressure reading    Rx / DC Orders ED Discharge Orders          Ordered    amLODipine (NORVASC) 10 MG tablet  Daily        03/31/23 1150    penicillin v potassium (VEETID) 500 MG tablet  4 times daily        03/31/23 1150              Tristian Sickinger A, PA-C 03/31/23 1157    Gerhard Munch, MD 03/31/23 [phone]

## 2023-03-31 NOTE — ED Triage Notes (Signed)
Pt has right sided facial swelling with c/o jaw pain  Pt also is out her blood pressure meds and can not afford them.

## 2023-04-03 ENCOUNTER — Other Ambulatory Visit: Payer: Self-pay

## 2023-04-29 ENCOUNTER — Ambulatory Visit: Payer: Self-pay | Admitting: Family Medicine

## 2023-05-04 ENCOUNTER — Ambulatory Visit: Payer: 59 | Admitting: Physician Assistant

## 2023-05-04 ENCOUNTER — Other Ambulatory Visit: Payer: Self-pay

## 2023-05-04 ENCOUNTER — Encounter: Payer: Self-pay | Admitting: Physician Assistant

## 2023-05-04 VITALS — BP 150/98 | HR 60 | Ht 64.0 in | Wt 184.0 lb

## 2023-05-04 DIAGNOSIS — I10 Essential (primary) hypertension: Secondary | ICD-10-CM | POA: Diagnosis not present

## 2023-05-04 DIAGNOSIS — K047 Periapical abscess without sinus: Secondary | ICD-10-CM

## 2023-05-04 MED ORDER — AMLODIPINE BESYLATE 10 MG PO TABS
10.0000 mg | ORAL_TABLET | Freq: Every day | ORAL | 1 refills | Status: DC
  Filled 2023-05-04: qty 90, 90d supply, fill #0

## 2023-05-04 MED ORDER — METOPROLOL SUCCINATE ER 50 MG PO TB24
50.0000 mg | ORAL_TABLET | Freq: Every day | ORAL | 1 refills | Status: DC
  Filled 2023-05-04: qty 90, 90d supply, fill #0

## 2023-05-04 MED ORDER — CLONIDINE HCL 0.2 MG PO TABS
0.1000 mg | ORAL_TABLET | Freq: Two times a day (BID) | ORAL | Status: DC
  Administered 2023-05-04: 0.1 mg via ORAL

## 2023-05-04 MED ORDER — LISINOPRIL-HYDROCHLOROTHIAZIDE 20-12.5 MG PO TABS
2.0000 | ORAL_TABLET | Freq: Every day | ORAL | 1 refills | Status: DC
  Filled 2023-05-04: qty 180, 90d supply, fill #0

## 2023-05-04 MED ORDER — PENICILLIN V POTASSIUM 500 MG PO TABS
500.0000 mg | ORAL_TABLET | Freq: Three times a day (TID) | ORAL | 0 refills | Status: AC
Start: 2023-05-04 — End: 2023-05-16
  Filled 2023-05-04: qty 30, 10d supply, fill #0

## 2023-05-04 NOTE — Patient Instructions (Signed)
Check on medicaid and orange card for financial assistance   Dental Abscess  A dental abscess is an area of pus in or around a tooth. It comes from an infection. It can cause pain and other symptoms. Treatment will help with symptoms and prevent the infection from spreading. What are the causes? This condition is caused by an infection in or around the tooth. This can be from: Very bad tooth decay (cavities). A bad injury to the tooth, such as a broken or chipped tooth. What increases the risk? The risk to get an abscess is higher in males. It is also more likely in people who: Have dental decay. Have very bad gum disease. Eat sugary snacks between meals. Use tobacco. Have diabetes. Have a weak disease-fighting system (immune system). Do not brush their teeth regularly. What are the signs or symptoms? Some mild symptoms are: Tenderness. Bad breath. Fever. A sharp, sour taste in the mouth. Pain in and around the infected tooth. Worse symptoms of this condition include: Swollen neck glands. Chills. Pus draining around the tooth. Swelling and redness around the tooth, the mouth, or the face. Very bad pain in and around the tooth. The worst symptoms can include: Difficulty swallowing. Difficulty opening your mouth. Feeling like you may vomit or vomiting. How is this treated? This is treated by getting rid of the infection. Your dentist will discuss ways to do this, including: Antibiotic medicines. Antibacterial mouth rinse. An incision in the abscess to drain out the pus. A root canal. Removing the tooth. Follow these instructions at home: Medicines Take over-the-counter and prescription medicines only as told by your dentist. If you were prescribed an antibiotic medicine, take it as told by your dentist. Do not stop taking it even if you start to feel better. If you were prescribed a gel that has numbing medicine in it, use it exactly as told. Ask your dentist if you  should avoid driving or using machines while you are taking your medicine. General instructions Rinse your mouth often with salt water. To make salt water, dissolve -1 tsp (3-6 g) of salt in 1 cup (237 mL) of warm water. Eat a soft diet while your mouth is healing. Drink enough fluid to keep your pee (urine) pale yellow. Do not apply heat to the outside of your mouth. Do not smoke or use any products that contain nicotine or tobacco. If you need help quitting, ask your dentist. Keep all follow-up visits. Prevent an abscess Brush your teeth every morning and every night. Use fluoride toothpaste. Floss your teeth each day. Get dental cleanings as often as told by your dentist. Think about getting dental sealant put on teeth that have deep holes (decay). Drink water that has fluoride in it. Most tap water has fluoride. Check the label on bottled water to see if it has fluoride in it. Drink water instead of sugary drinks. Eat healthy meals and snacks. Wear a mouth guard or face shield when you play sports. Contact a doctor if: Your pain is worse and medicine does not help. Get help right away if: You have a fever or chills. Your symptoms suddenly get worse. You have a very bad headache. You have problems breathing or swallowing. You have trouble opening your mouth. You have swelling in your neck or close to your eye. These symptoms may be an emergency. Get help right away. Call your local emergency services (911 in the [place] Do not wait to see if the symptoms will go away.  Do not drive yourself to the hospital. Summary A dental abscess is an area of pus in or around a tooth. It is caused by an infection. Treatment will help with symptoms and prevent the infection from spreading. Take over-the-counter and prescription medicines only as told by your dentist. To prevent an abscess, take good care of your teeth. Brush your teeth every morning and night. Use floss every day. Get dental  cleanings as often as told by your dentist. This information is not intended to replace advice given to you by your health care provider. Make sure you discuss any questions you have with your health care provider. Document Revised: 10/04/2020 Document Reviewed: 10/04/2020 Elsevier Patient Education  2024 [employer].

## 2023-05-04 NOTE — Progress Notes (Signed)
[name], female   DOB: 1960-12-14, 62 y.o.   MRN: [id number]   Jennifer [name], is a 62 y.o. female  [id number]  [id number]  DOB - Aug 07, 1961  Chief Complaint  Patient presents with   Medication Refill   Dental Pain    Right side        Subjective:   Jennifer [name] is a 62 y.o. female here today for BP RF and continued issues with a molar tooth that continues getting infected.  She usu takes penicillin for it and it will calm down for a while.  No fever or s/sx of systemic illness.    Needs BP meds.  She says she can't call for RF bc her phone doesn't work.  Denies HA/CP/SOB/ dizziness.  She has not stopped by for financial assistance at the [facility] or financial office No problems updated.  ALLERGIES: No Known Allergies  PAST MEDICAL HISTORY: Past Medical History:  Diagnosis Date   bipolar    Bipolar 1 disorder (HCC)    Depression    Fibroids    now s/p hysterectomy   Hypertension    Left ventricular hypertrophy    Polysubstance abuse (HCC)    IV drug [place], cocaine on occassion, smoking and alcoholism, has been going to AA, not completely sober    MEDICATIONS AT HOME: Prior to Admission medications   Medication Sig Start Date End Date Taking? Authorizing Provider  atorvastatin (LIPITOR) 10 MG tablet Take 1 tablet (10 mg total) by mouth daily. 12/25/22  Yes Mayers, Cari S, PA-C  cetirizine (ZYRTEC) 10 MG tablet Take 1 tablet (10 mg total) by mouth daily. 09/10/21  Yes Mayers, Cari S, PA-C  gabapentin (NEURONTIN) 300 MG capsule Take 1 capsule (300 mg total) by mouth at bedtime. 12/25/22  Yes Mayers, Cari S, PA-C  hydrOXYzine (ATARAX) 25 MG tablet Take 1 tablet (25 mg total) by mouth at bedtime as needed. 08/21/21  Yes Mayers, Cari S, PA-C  ibuprofen (ADVIL) 800 MG tablet Take 1 tablet (800 mg total) by mouth 3 (three) times daily. 12/18/20  Yes Arby Barrette, MD  ketorolac (TORADOL) 10 MG tablet Take 1 tablet (10 mg total) by mouth every 6 (six)  hours as needed. 02/03/23  Yes Small, Brooke L, PA  naproxen (NAPROSYN) 375 MG tablet Take 1 tablet (375 mg total) by mouth 2 (two) times daily. 12/26/21  Yes Linwood Dibbles, MD  ondansetron (ZOFRAN) 4 MG tablet Take 1 tablet (4 mg total) by mouth every 8 (eight) hours as needed for nausea or vomiting. 06/03/21  Yes Hoy Register, MD  pantoprazole (PROTONIX) 40 MG tablet Take 1 tablet (40 mg total) by mouth daily. 12/25/22  Yes Mayers, Cari S, PA-C  penicillin v potassium (VEETID) 500 MG tablet Take 1 tablet (500 mg total) by mouth 3 (three) times daily for 10 days. 05/04/23 05/14/23 Yes Jerimiah Wolman, Marzella Schlein, PA-C  amLODipine (NORVASC) 10 MG tablet TAKE 1 TABLET (10 MG TOTAL) BY MOUTH DAILY (Office visit for future refills) 05/04/23   Anders Simmonds, PA-C  lisinopril-hydrochlorothiazide (ZESTORETIC) 20-12.5 MG tablet Take 2 tablets by mouth daily. 05/04/23   Anders Simmonds, PA-C  metoprolol succinate (TOPROL-XL) 50 MG 24 hr tablet Take 1 tablet (50 mg total) by mouth daily. 05/04/23   Anders Simmonds, PA-C    ROS: Neg HEENT Neg resp Neg cardiac Neg GI Neg GU Neg MS Neg psych Neg neuro  Objective:   Vitals:   05/04/23 0916 05/04/23 0917  BP: [name])  160/98 (!) 150/98  Pulse: (!) 52 60  SpO2: 97% 97%  Weight: 184 lb (83.5 kg)   Height: 5\' 4"  (1.626 m)    Exam General appearance : Awake, alert, not in any distress. Speech Clear. Not toxic looking HEENT: Atraumatic and Normocephalic.  Mouth-poor dentition.  Tooth #31 is partly broken off.  Gum appears to be growing over bottom half of tooth.  There is redness with possible early abscess.  No submandibular LN Neck: Supple, no JVD. No cervical lymphadenopathy.  Chest: Good air entry bilaterally, CTAB.  No rales/rhonchi/wheezing CVS: S1 S2 regular, no murmurs.  Extremities: B/L Lower Ext shows no edema, both legs are warm to touch Neurology: Awake alert, and oriented X 3, CN II-XII intact, Non focal Skin: No Rash  Data Review Lab Results   Component Value Date   HGBA1C 5.8 02/06/2009    Assessment & Plan   1. Essential hypertension Per patient request, I gave her 0.2 clonidine to take half now and 1/2 this evening until she can get her prescriptions tomorrow.  She has never had problems taking clonidine.   - amLODipine (NORVASC) 10 MG tablet; TAKE 1 TABLET (10 MG TOTAL) BY MOUTH DAILY (Office visit for future refills)  Dispense: 90 tablet; Refill: 1 - lisinopril-hydrochlorothiazide (ZESTORETIC) 20-12.5 MG tablet; Take 2 tablets by mouth daily.  Dispense: 180 tablet; Refill: 1 - metoprolol succinate (TOPROL-XL) 50 MG 24 hr tablet; Take 1 tablet (50 mg total) by mouth daily.  Dispense: 90 tablet; Refill: 1  2. Dental infection - penicillin v potassium (VEETID) 500 MG tablet; Take 1 tablet (500 mg total) by mouth 3 (three) times daily for 10 days.  Dispense: 30 tablet; Refill: 0 Go for financial assistance.     Return in about 4 months (around 09/03/2023) for assign to PCP for chronic conditions.  The patient was given clear instructions to go to ER or return to medical center if symptoms don't improve, worsen or new problems develop. The patient verbalized understanding. The patient was told to call to get lab results if they haven't heard anything in the next week.      Georgian Co, PA-C [facility] and [facility] [place], [place] [phone]   05/04/2023, 9:37 AM

## 2023-05-06 ENCOUNTER — Other Ambulatory Visit: Payer: Self-pay

## 2023-05-18 ENCOUNTER — Ambulatory Visit: Payer: 59 | Attending: Family Medicine | Admitting: Family Medicine

## 2023-05-18 ENCOUNTER — Encounter: Payer: Self-pay | Admitting: Family Medicine

## 2023-05-18 ENCOUNTER — Other Ambulatory Visit: Payer: Self-pay

## 2023-05-18 VITALS — BP 130/83 | HR 51 | Ht 64.0 in | Wt 185.0 lb

## 2023-05-18 DIAGNOSIS — K219 Gastro-esophageal reflux disease without esophagitis: Secondary | ICD-10-CM

## 2023-05-18 DIAGNOSIS — Z1211 Encounter for screening for malignant neoplasm of colon: Secondary | ICD-10-CM | POA: Diagnosis not present

## 2023-05-18 DIAGNOSIS — I10 Essential (primary) hypertension: Secondary | ICD-10-CM | POA: Diagnosis not present

## 2023-05-18 DIAGNOSIS — F17210 Nicotine dependence, cigarettes, uncomplicated: Secondary | ICD-10-CM

## 2023-05-18 DIAGNOSIS — Z1231 Encounter for screening mammogram for malignant neoplasm of breast: Secondary | ICD-10-CM

## 2023-05-18 DIAGNOSIS — Z9189 Other specified personal risk factors, not elsewhere classified: Secondary | ICD-10-CM | POA: Diagnosis not present

## 2023-05-18 DIAGNOSIS — R1112 Projectile vomiting: Secondary | ICD-10-CM

## 2023-05-18 MED ORDER — AMLODIPINE BESYLATE 10 MG PO TABS
10.0000 mg | ORAL_TABLET | Freq: Every day | ORAL | 1 refills | Status: DC
Start: 2023-05-18 — End: 2024-01-19
  Filled 2023-05-18 – 2023-07-24 (×3): qty 90, 90d supply, fill #0
  Filled 2023-10-29: qty 90, 90d supply, fill #1

## 2023-05-18 MED ORDER — PANTOPRAZOLE SODIUM 40 MG PO TBEC
40.0000 mg | DELAYED_RELEASE_TABLET | Freq: Every day | ORAL | 1 refills | Status: DC
  Filled 2023-05-18: qty 90, 90d supply, fill #0

## 2023-05-18 MED ORDER — ONDANSETRON HCL 4 MG PO TABS
4.0000 mg | ORAL_TABLET | Freq: Three times a day (TID) | ORAL | 1 refills | Status: DC | PRN
  Filled 2023-05-18 – 2023-08-04 (×2): qty 30, 10d supply, fill #0

## 2023-05-18 MED ORDER — LISINOPRIL-HYDROCHLOROTHIAZIDE 20-12.5 MG PO TABS
2.0000 | ORAL_TABLET | Freq: Every day | ORAL | 1 refills | Status: DC
  Filled 2023-05-18 – 2023-07-24 (×3): qty 180, 90d supply, fill #0
  Filled 2023-10-29: qty 180, 90d supply, fill #1

## 2023-05-18 MED ORDER — METOPROLOL SUCCINATE ER 50 MG PO TB24
50.0000 mg | ORAL_TABLET | Freq: Every day | ORAL | 1 refills | Status: DC
Start: 2023-05-18 — End: 2024-01-19
  Filled 2023-05-18 – 2023-07-24 (×3): qty 90, 90d supply, fill #0
  Filled 2023-10-29: qty 90, 90d supply, fill #1

## 2023-05-18 MED ORDER — ATORVASTATIN CALCIUM 10 MG PO TABS
10.0000 mg | ORAL_TABLET | Freq: Every day | ORAL | 1 refills | Status: DC
  Filled 2023-05-18 – 2023-07-24 (×3): qty 90, 90d supply, fill #0
  Filled 2023-10-29: qty 90, 90d supply, fill #1

## 2023-05-18 NOTE — Progress Notes (Signed)
Subjective:  [name], female    DOB: 07-Aug-1961  Age: 62 y.o. MRN: [id number]  CC: No chief complaint on file.   HPI Jennifer [name] is a 62 y.o. year old female with a history of hypertension, homelessness here for chronic disease management.   Interval History:     She complains of difficulty obtaining refills on her antihypertensive. Her last office visit with me was in 05/2021 but she has been seen on the mobile unit for acute visits.  She endorses adherence with her antihypertensive and her statin. She is angry that she has been waiting to be seen as she states she came a lot earlier than her appointment and is yelling in the room. Continues to smoke 1 pack of cigarettes per day for over 30 years and is not interested in quitting. She continues to have nausea for which PPI and antiemetic have been ineffective.  I referred her to GI home she could not sleep because she states she had no insurance   Past Medical History:  Diagnosis Date   bipolar    Bipolar 1 disorder (HCC)    Depression    Fibroids    now s/p hysterectomy   Hypertension    Left ventricular hypertrophy    Polysubstance abuse (HCC)    IV drug [place], cocaine on occassion, smoking and alcoholism, has been going to AA, not completely sober    Past Surgical History:  Procedure Laterality Date   ABDOMINAL HYSTERECTOMY     2003   BILATERAL SALPINGOOPHORECTOMY     01/2010   DILATION AND CURETTAGE OF UTERUS     EXPLORATORY LAPAROTOMY     complex pelvic mass 2011   TONSILLECTOMY      Family History  Problem Relation Age of Onset   Hypertension Mother    Hypertension Sister    Diabetes Father     Social History   Socioeconomic History   Marital status: Single    Spouse name: Not on file   Number of children: 0   Years of education: Not on file   Highest education level: Some college, no degree  Occupational History   Not on file  Tobacco Use   Smoking status: Every Day     Current packs/day: 1.00    Average packs/day: 1 pack/day for 20.0 years (20.0 ttl pk-yrs)    Types: Cigarettes   Smokeless tobacco: Never   Tobacco comments:    Wants to quit.   Vaping Use   Vaping status: Never Used  Substance and Sexual Activity   Alcohol use: Yes    Comment: occ.   Drug use: Not Currently    Types: Cocaine, Marijuana   Sexual activity: Not Currently    Birth control/protection: Surgical  Other Topics Concern   Not on file  Social History Narrative   Financial assistance approved for 100% discount at [facility] and has [facility] card per Rudell Cobb   07/19/2010         Social Determinants of Health   Financial Resource Strain: Not on file  Food Insecurity: Food Insecurity Present (03/14/2021)   Hunger Vital Sign    Worried About Running Out of Food in the Last Year: Often true    Ran Out of Food in the Last Year: Often true  Transportation Needs: Unmet Transportation Needs (03/14/2021)   PRAPARE - Administrator, Civil Service (Medical): Yes    Lack of Transportation (Non-Medical): Yes  Physical Activity: Not  on file  Stress: Not on file  Social Connections: Not on file    No Known Allergies  Outpatient Medications Prior to Visit  Medication Sig Dispense Refill   cetirizine (ZYRTEC) 10 MG tablet Take 1 tablet (10 mg total) by mouth daily. 30 tablet 11   gabapentin (NEURONTIN) 300 MG capsule Take 1 capsule (300 mg total) by mouth at bedtime. 90 capsule 1   hydrOXYzine (ATARAX) 25 MG tablet Take 1 tablet (25 mg total) by mouth at bedtime as needed. 30 tablet 1   ibuprofen (ADVIL) 800 MG tablet Take 1 tablet (800 mg total) by mouth 3 (three) times daily. 21 tablet 0   ketorolac (TORADOL) 10 MG tablet Take 1 tablet (10 mg total) by mouth every 6 (six) hours as needed. 20 tablet 0   naproxen (NAPROSYN) 375 MG tablet Take 1 tablet (375 mg total) by mouth 2 (two) times daily. 20 tablet 0   amLODipine (NORVASC) 10 MG tablet TAKE 1 TABLET (10 MG TOTAL) BY MOUTH  DAILY (Office visit for future refills) 90 tablet 1   atorvastatin (LIPITOR) 10 MG tablet Take 1 tablet (10 mg total) by mouth daily. 90 tablet 1   lisinopril-hydrochlorothiazide (ZESTORETIC) 20-12.5 MG tablet Take 2 tablets by mouth daily. 180 tablet 1   metoprolol succinate (TOPROL-XL) 50 MG 24 hr tablet Take 1 tablet (50 mg total) by mouth daily. 90 tablet 1   ondansetron (ZOFRAN) 4 MG tablet Take 1 tablet (4 mg total) by mouth every 8 (eight) hours as needed for nausea or vomiting. 30 tablet 1   pantoprazole (PROTONIX) 40 MG tablet Take 1 tablet (40 mg total) by mouth daily. 90 tablet 1   Facility-Administered Medications Prior to Visit  Medication Dose Route Frequency Provider Last Rate Last Admin   cloNIDine (CATAPRES) tablet 0.1 mg  0.1 mg Oral BID    0.1 mg at 05/04/23 0955     ROS Review of Systems  Constitutional:  Negative for activity change and appetite change.  HENT:  Negative for sinus pressure and sore throat.   Respiratory:  Negative for chest tightness, shortness of breath and wheezing.   Cardiovascular:  Negative for chest pain and palpitations.  Gastrointestinal:  Negative for abdominal distention, abdominal pain and constipation.  Genitourinary: Negative.   Musculoskeletal: Negative.   Psychiatric/Behavioral:  Negative for behavioral problems and dysphoric mood.     Objective:  BP 130/83   Pulse (!) 51   Ht 5\' 4"  (1.626 m)   Wt 185 lb (83.9 kg)   LMP 12/04/2009   SpO2 96%   BMI 31.76 kg/m      05/18/2023   10:52 AM 05/04/2023    9:17 AM 05/04/2023    9:16 AM  BP/Weight  Systolic BP 130 150 160  Diastolic BP 83 98 98  Wt. (Lbs) 185  184  BMI 31.76 kg/m2  31.58 kg/m2      Physical Exam Constitutional:      Appearance: She is well-developed.  Cardiovascular:     Rate and Rhythm: Bradycardia present.     Heart sounds: Normal heart sounds. No murmur heard. Pulmonary:     Effort: Pulmonary effort is normal.     Breath sounds: Normal breath sounds.  No wheezing or rales.  Chest:     Chest wall: No tenderness.  Abdominal:     General: Bowel sounds are normal. There is no distension.     Palpations: Abdomen is soft. There is no mass.     Tenderness:  There is no abdominal tenderness.  Musculoskeletal:        General: Normal range of motion.     Right lower leg: No edema.     Left lower leg: No edema.  Neurological:     Mental Status: She is alert and oriented to person, place, and time.  Psychiatric:        Mood and Affect: Mood normal.        Latest Ref Rng & Units 12/25/2022   12:34 PM 04/28/2022    9:03 AM 02/19/2022    9:58 AM  CMP  Glucose 70 - 99 mg/dL 82  89  034   BUN 6 - 24 mg/dL 17  11  15    Creatinine 0.57 - 1.00 mg/dL 7.42  5.95  6.38   Sodium 134 - 144 mmol/L 143  141  142   Potassium 3.5 - 5.2 mmol/L 4.2  3.8  4.1   Chloride 96 - 106 mmol/L 108  105  107   CO2 22 - 32 mmol/L  25    Calcium 8.7 - 10.2 mg/dL 9.5  75.6  9.9   Total Protein 6.0 - 8.5 g/dL 6.5   6.8   Total Bilirubin 0.0 - 1.2 mg/dL 0.3   0.3   Alkaline Phos 44 - 121 IU/L 63   58   AST 0 - 40 IU/L 15   16     Lipid Panel     Component Value Date/Time   CHOL 187 02/19/2022 0958   TRIG 84 02/19/2022 0958   HDL 56 02/19/2022 0958   CHOLHDL 3.3 02/19/2022 0958   CHOLHDL 3.9 Ratio 02/06/2009 2025   VLDL 33 02/06/2009 2025   LDLCALC 116 (H) 02/19/2022 0958    CBC    Component Value Date/Time   WBC 4.2 12/25/2022 1234   WBC 4.9 03/28/2019 1205   RBC 5.15 12/25/2022 1234   RBC 4.87 03/28/2019 1205   HGB 15.1 12/25/2022 1234   HCT 47.2 (H) 12/25/2022 1234   PLT 205 12/25/2022 1234   MCV 92 12/25/2022 1234   MCH 29.3 12/25/2022 1234   MCH 29.2 03/28/2019 1205   MCHC 32.0 12/25/2022 1234   MCHC 32.1 03/28/2019 1205   RDW 14.1 12/25/2022 1234   LYMPHSABS 1.5 12/25/2022 1234   MONOABS 0.4 03/28/2019 1205   EOSABS 0.1 12/25/2022 1234   BASOSABS 0.0 12/25/2022 1234    Lab Results  Component Value Date   HGBA1C 5.8 02/06/2009     The 10-year ASCVD risk score (Arnett DK, et al., 2019) is: 12.8%   Values used to calculate the score:     Age: 40 years     Sex: Female     Is Non-Hispanic African American: Yes     Diabetic: No     Tobacco smoker: Yes     Systolic Blood Pressure: 130 mmHg     Is BP treated: Yes     HDL Cholesterol: 56 mg/dL     Total Cholesterol: 187 mg/dL  Assessment & Plan:     1. Essential hypertension Controlled Counseled on blood pressure goal of less than 130/80, low-sodium, DASH diet, medication compliance, 150 minutes of moderate intensity exercise per week. Discussed medication compliance, adverse effects. - Basic Metabolic Panel - amLODipine (NORVASC) 10 MG tablet; TAKE 1 TABLET (10 MG TOTAL) BY MOUTH DAILY (Office visit for future refills)  Dispense: 90 tablet; Refill: 1 - lisinopril-hydrochlorothiazide (ZESTORETIC) 20-12.5 MG tablet; Take 2 tablets by mouth daily.  Dispense: 180  tablet; Refill: 1 - metoprolol succinate (TOPROL-XL) 50 MG 24 hr tablet; Take 1 tablet (50 mg total) by mouth daily.  Dispense: 90 tablet; Refill: 1  2. Encounter for screening mammogram for malignant neoplasm of breast - MM 3D SCREENING MAMMOGRAM BILATERAL BREAST; Future  3. Screening for colon cancer -Cologuard test ordered  4. At high risk for cardiovascular disease - atorvastatin (LIPITOR) 10 MG tablet; Take 1 tablet (10 mg total) by mouth daily.  Dispense: 90 tablet; Refill: 1  5. Projectile vomiting with nausea This occurs when she eats after 6 PM Referred to GI previously but she could not undergo endoscopy due to the fact that she had no insurance per patient - ondansetron (ZOFRAN) 4 MG tablet; Take 1 tablet (4 mg total) by mouth every 8 (eight) hours as needed for nausea or vomiting.  Dispense: 30 tablet; Refill: 1  6. Gastroesophageal reflux disease without esophagitis Uncontrolled See #5 above - pantoprazole (PROTONIX) 40 MG tablet; Take 1 tablet (40 mg total) by mouth daily.  Dispense:  90 tablet; Refill: 1  7. Smoking greater than 20 pack years Spent 3 minutes counseling on smoking cessation and she is not ready to quit - CT CHEST LUNG CANCER SCREENING LOW DOSE WO CONTRAST; Future             Meds ordered this encounter  Medications   amLODipine (NORVASC) 10 MG tablet    Sig: TAKE 1 TABLET (10 MG TOTAL) BY MOUTH DAILY (Office visit for future refills)    Dispense:  90 tablet    Refill:  1   atorvastatin (LIPITOR) 10 MG tablet    Sig: Take 1 tablet (10 mg total) by mouth daily.    Dispense:  90 tablet    Refill:  1    Change to 90 day   lisinopril-hydrochlorothiazide (ZESTORETIC) 20-12.5 MG tablet    Sig: Take 2 tablets by mouth daily.    Dispense:  180 tablet    Refill:  1   metoprolol succinate (TOPROL-XL) 50 MG 24 hr tablet    Sig: Take 1 tablet (50 mg total) by mouth daily.    Dispense:  90 tablet    Refill:  1   ondansetron (ZOFRAN) 4 MG tablet    Sig: Take 1 tablet (4 mg total) by mouth every 8 (eight) hours as needed for nausea or vomiting.    Dispense:  30 tablet    Refill:  1   pantoprazole (PROTONIX) 40 MG tablet    Sig: Take 1 tablet (40 mg total) by mouth daily.    Dispense:  90 tablet    Refill:  1    Follow-up: Return in about 6 months (around 11/16/2023).       Hoy Register, MD, FAAFP. [facility] [place], [place] [phone]   05/18/2023, 11:40 AM

## 2023-05-18 NOTE — Patient Instructions (Signed)

## 2023-05-19 LAB — BASIC METABOLIC PANEL
BUN/Creatinine Ratio: 21 (ref 12–28)
BUN: 20 mg/dL (ref 8–27)
CO2: 23 mmol/L (ref 20–29)
Calcium: 10.1 mg/dL (ref 8.7–10.3)
Chloride: 103 mmol/L (ref 96–106)
Creatinine, Ser: 0.97 mg/dL (ref 0.57–1.00)
Glucose: 94 mg/dL (ref 70–99)
Potassium: 4.3 mmol/L (ref 3.5–5.2)
Sodium: 140 mmol/L (ref 134–144)
eGFR: 67 mL/min/{1.73_m2} (ref >59)

## 2023-05-20 ENCOUNTER — Telehealth: Payer: Self-pay

## 2023-05-20 DIAGNOSIS — F17210 Nicotine dependence, cigarettes, uncomplicated: Secondary | ICD-10-CM

## 2023-05-20 NOTE — Telephone Encounter (Signed)
Can CT scan be changed to [facility] patient does not have insurance.

## 2023-05-20 NOTE — Telephone Encounter (Signed)
I have changed it.  Her chart had said she had [facility].

## 2023-05-22 NOTE — Telephone Encounter (Signed)
Scan has been scheduled and message has been left with patient to return phone call.

## 2023-05-27 ENCOUNTER — Telehealth: Payer: Self-pay

## 2023-05-27 ENCOUNTER — Other Ambulatory Visit: Payer: Self-pay

## 2023-05-27 NOTE — Telephone Encounter (Signed)
Attempted to contact pre cert and left VM,  Will submit Authorization today.

## 2023-05-27 NOTE — Telephone Encounter (Signed)
Copied from CRM [id number]. Topic: General - Other >> May 27, 2023 10:28 AM Everette C wrote: Reason for CRM: Tanya with [facility] has requested contact with a member of staff regarding authorization for an upcoming CT for the patient   The patient has been scheduled for 05/28/23 and th urgency of contact has been stressed   Please contact Tanya at [phone] ext [phone]

## 2023-05-28 ENCOUNTER — Ambulatory Visit (HOSPITAL_COMMUNITY): Payer: 59

## 2023-06-04 ENCOUNTER — Other Ambulatory Visit (HOSPITAL_COMMUNITY): Payer: Self-pay

## 2023-06-04 ENCOUNTER — Emergency Department (HOSPITAL_COMMUNITY)
Admission: EM | Admit: 2023-06-04 | Discharge: 2023-06-04 | Disposition: A | Discharge status: Home / Self Care | Payer: 59 | Attending: Emergency Medicine | Admitting: Emergency Medicine

## 2023-06-04 ENCOUNTER — Encounter (HOSPITAL_COMMUNITY): Payer: Self-pay

## 2023-06-04 ENCOUNTER — Other Ambulatory Visit: Payer: Self-pay

## 2023-06-04 DIAGNOSIS — Z79899 Other long term (current) drug therapy: Secondary | ICD-10-CM | POA: Insufficient documentation

## 2023-06-04 DIAGNOSIS — J029 Acute pharyngitis, unspecified: Secondary | ICD-10-CM | POA: Diagnosis not present

## 2023-06-04 DIAGNOSIS — K047 Periapical abscess without sinus: Secondary | ICD-10-CM

## 2023-06-04 DIAGNOSIS — R109 Unspecified abdominal pain: Secondary | ICD-10-CM | POA: Diagnosis not present

## 2023-06-04 DIAGNOSIS — F419 Anxiety disorder, unspecified: Secondary | ICD-10-CM | POA: Insufficient documentation

## 2023-06-04 DIAGNOSIS — R197 Diarrhea, unspecified: Secondary | ICD-10-CM | POA: Insufficient documentation

## 2023-06-04 DIAGNOSIS — B349 Viral infection, unspecified: Secondary | ICD-10-CM

## 2023-06-04 DIAGNOSIS — I10 Essential (primary) hypertension: Secondary | ICD-10-CM | POA: Insufficient documentation

## 2023-06-04 DIAGNOSIS — R112 Nausea with vomiting, unspecified: Secondary | ICD-10-CM | POA: Insufficient documentation

## 2023-06-04 DIAGNOSIS — K029 Dental caries, unspecified: Secondary | ICD-10-CM | POA: Diagnosis not present

## 2023-06-04 DIAGNOSIS — Z20822 Contact with and (suspected) exposure to covid-19: Secondary | ICD-10-CM | POA: Diagnosis not present

## 2023-06-04 LAB — CBC
HCT: 46.8 % — ABNORMAL HIGH (ref 36.0–46.0)
Hemoglobin: 14.8 g/dL (ref 12.0–15.0)
MCH: 28.5 pg (ref 26.0–34.0)
MCHC: 31.6 g/dL (ref 30.0–36.0)
MCV: 90.2 fL (ref 80.0–100.0)
Platelets: 169 10*3/uL (ref 150–400)
RBC: 5.19 MIL/uL — ABNORMAL HIGH (ref 3.87–5.11)
RDW: 14.3 % (ref 11.5–15.5)
WBC: 9.9 10*3/uL (ref 4.0–10.5)
nRBC: 0 % (ref 0.0–0.2)

## 2023-06-04 LAB — COMPREHENSIVE METABOLIC PANEL
ALT: 17 U/L (ref 0–44)
AST: 19 U/L (ref 15–41)
Albumin: 3.6 g/dL (ref 3.5–5.0)
Alkaline Phosphatase: 57 U/L (ref 38–126)
Anion gap: 10 (ref 5–15)
BUN: 13 mg/dL (ref 6–20)
CO2: 24 mmol/L (ref 22–32)
Calcium: 9.8 mg/dL (ref 8.9–10.3)
Chloride: 104 mmol/L (ref 98–111)
Creatinine, Ser: 1.03 mg/dL — ABNORMAL HIGH (ref 0.44–1.00)
GFR, Estimated: >60 mL/min (ref >60)
Glucose, Bld: 106 mg/dL — ABNORMAL HIGH (ref 70–99)
Potassium: 3.6 mmol/L (ref 3.5–5.1)
Sodium: 138 mmol/L (ref 135–145)
Total Bilirubin: 0.9 mg/dL (ref 0.3–1.2)
Total Protein: 7.4 g/dL (ref 6.5–8.1)

## 2023-06-04 LAB — RESP PANEL BY RT-PCR (RSV, FLU A&B, COVID)  RVPGX2
Influenza A by PCR: NEGATIVE
Influenza B by PCR: NEGATIVE
Resp Syncytial Virus by PCR: NEGATIVE
SARS Coronavirus 2 by RT PCR: NEGATIVE

## 2023-06-04 LAB — LIPASE, BLOOD: Lipase: 25 U/L (ref 11–51)

## 2023-06-04 MED ORDER — KETOROLAC TROMETHAMINE 15 MG/ML IJ SOLN
15.0000 mg | Freq: Once | INTRAMUSCULAR | Status: AC
  Administered 2023-06-04: 15 mg via INTRAVENOUS
  Filled 2023-06-04: qty 1

## 2023-06-04 MED ORDER — OXYCODONE-ACETAMINOPHEN 5-325 MG PO TABS
1.0000 | ORAL_TABLET | Freq: Once | ORAL | Status: AC
  Administered 2023-06-04: 1 via ORAL
  Filled 2023-06-04: qty 1

## 2023-06-04 MED ORDER — ONDANSETRON HCL 4 MG/2ML IJ SOLN
4.0000 mg | Freq: Once | INTRAMUSCULAR | Status: AC
  Administered 2023-06-04: 4 mg via INTRAVENOUS
  Filled 2023-06-04: qty 2

## 2023-06-04 MED ORDER — AMOXICILLIN-POT CLAVULANATE 875-125 MG PO TABS
1.0000 | ORAL_TABLET | Freq: Two times a day (BID) | ORAL | 0 refills | Status: DC
  Filled 2023-06-04: qty 14, 7d supply, fill #0

## 2023-06-04 MED ORDER — AMOXICILLIN-POT CLAVULANATE 875-125 MG PO TABS
1.0000 | ORAL_TABLET | Freq: Once | ORAL | Status: AC
  Administered 2023-06-04: 1 via ORAL
  Filled 2023-06-04: qty 1

## 2023-06-04 MED ORDER — ACETAMINOPHEN 500 MG PO TABS
1000.0000 mg | ORAL_TABLET | Freq: Once | ORAL | Status: AC
  Administered 2023-06-04: 1000 mg via ORAL
  Filled 2023-06-04: qty 2

## 2023-06-04 NOTE — ED Triage Notes (Signed)
Patient BIB EMS with complaint of nausea and vomiting. Per EMS patient aggressive with [employer], [facility]. EMS reports patient complaint of nausea and vomiting.

## 2023-06-04 NOTE — ED Notes (Signed)
Patient up requesting pain medication. Explained to patient that provider has been notified of patients pain rating & request for additional pain medication, but provider is currently with a critical patient. Patient requesting for RN to request additional medication for pain from another provider.

## 2023-06-04 NOTE — Discharge Instructions (Signed)
Please take entire course of antibiotics as directed.  Continue using ibuprofen / Tylenol, as well as prescribed Orajel for pain.  You will need to follow-up with your dentist for continued management of this. Please see dental resources below. Return to the emergency department for fevers, swelling or pain under the tongue or in the neck, difficulty breathing or swallowing, nausea or vomiting that does not stop, or any other new or concerning symptoms.

## 2023-06-04 NOTE — ED Provider Notes (Addendum)
Jennifer [name] EMERGENCY DEPARTMENT AT [facility] Provider Note   CSN: [id number] Arrival date & time: 06/04/23  0247     History  Chief Complaint  Patient presents with   Vomiting    Jennifer [name] is a 62 y.o. female who presents with multiple complaints.  Complaining of bodyaches, sore throat, ears hurting, teeth hurting, nausea vomiting diarrhea, abdominal pain.  No respiratory complaints or cough.  No documented fevers or chills though she has felt feverish.  No known ill contacts.  Symptoms x 48 hours.  She took Aleve 24 hours ago but states it did not improve her symptoms and has not taken any over the counter medications otherwise.  I have reviewed her medical records.  History of hypertension bipolar, polysubstance use.  No anticoagulation.  HPI     Home Medications Prior to Admission medications   Medication Sig Start Date End Date Taking? Authorizing Provider  amLODipine (NORVASC) 10 MG tablet TAKE 1 TABLET (10 MG TOTAL) BY MOUTH DAILY (Office visit for future refills) 05/18/23   Hoy Register, MD  atorvastatin (LIPITOR) 10 MG tablet Take 1 tablet (10 mg total) by mouth daily. 05/18/23   Hoy Register, MD  cetirizine (ZYRTEC) 10 MG tablet Take 1 tablet (10 mg total) by mouth daily. 09/10/21   Mayers, Cari S, PA-C  gabapentin (NEURONTIN) 300 MG capsule Take 1 capsule (300 mg total) by mouth at bedtime. 12/25/22   Mayers, Cari S, PA-C  hydrOXYzine (ATARAX) 25 MG tablet Take 1 tablet (25 mg total) by mouth at bedtime as needed. 08/21/21   Mayers, Cari S, PA-C  ibuprofen (ADVIL) 800 MG tablet Take 1 tablet (800 mg total) by mouth 3 (three) times daily. 12/18/20   Arby Barrette, MD  ketorolac (TORADOL) 10 MG tablet Take 1 tablet (10 mg total) by mouth every 6 (six) hours as needed. 02/03/23   Small, Brooke L, PA  lisinopril-hydrochlorothiazide (ZESTORETIC) 20-12.5 MG tablet Take 2 tablets by mouth daily. 05/18/23   Hoy Register, MD  metoprolol succinate (TOPROL-XL)  50 MG 24 hr tablet Take 1 tablet (50 mg total) by mouth daily. 05/18/23   Hoy Register, MD  naproxen (NAPROSYN) 375 MG tablet Take 1 tablet (375 mg total) by mouth 2 (two) times daily. 12/26/21   Linwood Dibbles, MD  ondansetron (ZOFRAN) 4 MG tablet Take 1 tablet (4 mg total) by mouth every 8 (eight) hours as needed for nausea or vomiting. 05/18/23   Hoy Register, MD  pantoprazole (PROTONIX) 40 MG tablet Take 1 tablet (40 mg total) by mouth daily. 05/18/23   Hoy Register, MD      Allergies    Patient has no known allergies.    Review of Systems   Review of Systems  Constitutional:  Positive for activity change, appetite change, chills, fatigue and fever.  HENT:  Positive for congestion and sore throat. Negative for trouble swallowing and voice change.   Respiratory: Negative.    Cardiovascular: Negative.   Gastrointestinal:  Positive for abdominal pain, diarrhea, nausea and vomiting.  Genitourinary: Negative.   Musculoskeletal:  Positive for myalgias. Negative for neck pain and neck stiffness.  Skin:  Negative for rash.  Neurological:  Positive for headaches. Negative for light-headedness.    Physical Exam Updated Vital Signs BP (!) 148/85 (BP Location: Right Arm)   Pulse 73   Temp 98.5 F (36.9 C) (Oral)   Resp 18   LMP 12/04/2009   SpO2 100%  Physical Exam Vitals and nursing note reviewed.  Constitutional:      Appearance: She is not ill-appearing or toxic-appearing.  HENT:     Head: Normocephalic and atraumatic.     Nose: Congestion present.     Mouth/Throat:     Mouth: Mucous membranes are moist.     Dentition: Abnormal dentition. Dental tenderness and dental caries present.     Pharynx: Oropharynx is clear. Uvula midline. Posterior oropharyngeal erythema present. No oropharyngeal exudate.   Eyes:     General: Lids are normal. Vision grossly intact.        Right eye: No discharge.        Left eye: No discharge.     Extraocular Movements: Extraocular movements  intact.     Conjunctiva/sclera: Conjunctivae normal.     Pupils: Pupils are equal, round, and reactive to light.  Neck:     Trachea: Trachea and phonation normal.  Cardiovascular:     Rate and Rhythm: Normal rate and regular rhythm.     Pulses: Normal pulses.     Heart sounds: Normal heart sounds. No murmur heard. Pulmonary:     Effort: Pulmonary effort is normal. No tachypnea, bradypnea, accessory muscle usage, prolonged expiration or respiratory distress.     Breath sounds: Normal breath sounds. No wheezing or rales.  Chest:     Chest wall: No mass, lacerations, deformity, swelling, tenderness, crepitus or edema.  Abdominal:     General: Bowel sounds are normal. There is no distension.     Palpations: Abdomen is soft.     Tenderness: There is no abdominal tenderness. There is no right CVA tenderness, left CVA tenderness, guarding or rebound.  Musculoskeletal:        General: No deformity.     Cervical back: Normal range of motion and neck supple.     Right lower leg: No edema.     Left lower leg: No edema.  Lymphadenopathy:     Cervical: No cervical adenopathy.  Skin:    General: Skin is warm and dry.  Neurological:     Mental Status: She is alert. Mental status is at baseline.     GCS: GCS eye subscore is 4. GCS verbal subscore is 5. GCS motor subscore is 6.     Gait: Gait is intact.  Psychiatric:        Mood and Affect: Mood is anxious. Affect is tearful.        Behavior: Behavior is agitated.     ED Results / Procedures / Treatments   Labs (all labs ordered are listed, but only abnormal results are displayed) Labs Reviewed  COMPREHENSIVE METABOLIC PANEL - Abnormal; Notable for the following components:      Result Value   Glucose, Bld 106 (*)    Creatinine, Ser 1.03 (*)    All other components within normal limits  CBC - Abnormal; Notable for the following components:   RBC 5.19 (*)    HCT 46.8 (*)    All other components within normal limits  RESP PANEL BY  RT-PCR (RSV, FLU A&B, COVID)  RVPGX2  LIPASE, BLOOD  URINALYSIS, ROUTINE W REFLEX MICROSCOPIC    EKG None  Radiology No results found.  Procedures Procedures    Medications Ordered in ED Medications  ketorolac (TORADOL) 15 MG/ML injection 15 mg (15 mg Intravenous Given 06/04/23 0359)  ondansetron (ZOFRAN) injection 4 mg (4 mg Intravenous Given 06/04/23 0359)    ED Course/ Medical Decision Making/ A&P  Medical Decision Making 62 year old female with multiple complaints who presents to the ED for same.  Hypertensive on intake, vitals otherwise normal.  Cardiopulmonary abdominal exams are benign.  Patient tearful, requesting that this provider take her pain away multiple times.    Amount and/or Complexity of Data Reviewed Labs: ordered.    Details: CBC is without leukocytosis, no anemia.  CMP unremarkable, RVP negative, lipase is normal.  Patient has not provided urine sample  Risk OTC drugs. Prescription drug management.   .  Zofran and Toradol administered with improvement patient's symptomatology though she is still requesting pain medication. ABX for dental infection. Also suspect viral syndrome contributing to patient's symptomatology.  Clincal concern for emergent underlying etiology of this patient's symptoms that would warrant further ED workup or inpatient management is exceedingly low.  Clinical picture most consistent with acute viral syndrome.  Recommend OTC analgesia as needed, as well as increase hydration.  Jennifer [name] voiced understanding of her medical evaluation and treatment plan. Each of their questions answered to their expressed satisfaction.  Return precautions were given.  Patient is well-appearing, stable, and was discharged in good condition.  This chart was dictated using voice recognition software, Dragon. Despite the best efforts of this provider to proofread and correct errors, errors may still occur which can change  documentation meaning.         Final Clinical Impression(s) / ED Diagnoses Final diagnoses:  None    Rx / DC Orders ED Discharge Orders     None         Paris Lore, PA-C 06/04/23 8295    Gilda Crease, MD 06/05/23 [phone]

## 2023-06-04 NOTE — ED Notes (Signed)
Provider at bedside

## 2023-06-21 ENCOUNTER — Emergency Department (HOSPITAL_COMMUNITY): Payer: 59

## 2023-06-21 ENCOUNTER — Other Ambulatory Visit: Payer: Self-pay

## 2023-06-21 ENCOUNTER — Emergency Department (HOSPITAL_COMMUNITY)
Admission: EM | Admit: 2023-06-21 | Discharge: 2023-06-21 | Disposition: A | Discharge status: Other Acute Inpatient Hospital | Payer: 59 | Attending: Emergency Medicine | Admitting: Emergency Medicine

## 2023-06-21 ENCOUNTER — Encounter (HOSPITAL_COMMUNITY): Payer: Self-pay | Admitting: Emergency Medicine

## 2023-06-21 DIAGNOSIS — R220 Localized swelling, mass and lump, head: Secondary | ICD-10-CM

## 2023-06-21 DIAGNOSIS — K047 Periapical abscess without sinus: Secondary | ICD-10-CM | POA: Diagnosis not present

## 2023-06-21 DIAGNOSIS — Z79899 Other long term (current) drug therapy: Secondary | ICD-10-CM | POA: Diagnosis not present

## 2023-06-21 DIAGNOSIS — J0510 Acute epiglottitis without obstruction: Secondary | ICD-10-CM | POA: Insufficient documentation

## 2023-06-21 DIAGNOSIS — J390 Retropharyngeal and parapharyngeal abscess: Secondary | ICD-10-CM | POA: Insufficient documentation

## 2023-06-21 DIAGNOSIS — L0291 Cutaneous abscess, unspecified: Secondary | ICD-10-CM

## 2023-06-21 LAB — CBC WITH DIFFERENTIAL/PLATELET
Abs Immature Granulocytes: 0.03 10*3/uL (ref 0.00–0.07)
Basophils Absolute: 0 10*3/uL (ref 0.0–0.1)
Basophils Relative: 0 %
Eosinophils Absolute: 0.1 10*3/uL (ref 0.0–0.5)
Eosinophils Relative: 1 %
HCT: 31.5 % — ABNORMAL LOW (ref 36.0–46.0)
Hemoglobin: 9.7 g/dL — ABNORMAL LOW (ref 12.0–15.0)
Immature Granulocytes: 0 %
Lymphocytes Relative: 12 %
Lymphs Abs: 1 10*3/uL (ref 0.7–4.0)
MCH: 30.1 pg (ref 26.0–34.0)
MCHC: 30.8 g/dL (ref 30.0–36.0)
MCV: 97.8 fL (ref 80.0–100.0)
Monocytes Absolute: 0.5 10*3/uL (ref 0.1–1.0)
Monocytes Relative: 6 %
Neutro Abs: 7 10*3/uL (ref 1.7–7.7)
Neutrophils Relative %: 81 %
Platelets: 143 10*3/uL — ABNORMAL LOW (ref 150–400)
RBC: 3.22 MIL/uL — ABNORMAL LOW (ref 3.87–5.11)
RDW: 15.1 % (ref 11.5–15.5)
WBC: 8.6 10*3/uL (ref 4.0–10.5)
nRBC: 0 % (ref 0.0–0.2)

## 2023-06-21 LAB — GROUP A STREP BY PCR: Group A Strep by PCR: NOT DETECTED

## 2023-06-21 LAB — BASIC METABOLIC PANEL
Anion gap: 8 (ref 5–15)
BUN: 16 mg/dL (ref 6–20)
CO2: 19 mmol/L — ABNORMAL LOW (ref 22–32)
Calcium: 7.2 mg/dL — ABNORMAL LOW (ref 8.9–10.3)
Chloride: 115 mmol/L — ABNORMAL HIGH (ref 98–111)
Creatinine, Ser: 0.6 mg/dL (ref 0.44–1.00)
GFR, Estimated: >60 mL/min (ref >60)
Glucose, Bld: 69 mg/dL — ABNORMAL LOW (ref 70–99)
Potassium: 3.2 mmol/L — ABNORMAL LOW (ref 3.5–5.1)
Sodium: 142 mmol/L (ref 135–145)

## 2023-06-21 LAB — CBG MONITORING, ED: Glucose-Capillary: 159 mg/dL — ABNORMAL HIGH (ref 70–99)

## 2023-06-21 LAB — CBC
HCT: 39.2 % (ref 36.0–46.0)
Hemoglobin: 12.8 g/dL (ref 12.0–15.0)
MCH: 30.4 pg (ref 26.0–34.0)
MCHC: 32.7 g/dL (ref 30.0–36.0)
MCV: 93.1 fL (ref 80.0–100.0)
Platelets: 195 10*3/uL (ref 150–400)
RBC: 4.21 MIL/uL (ref 3.87–5.11)
RDW: 14.9 % (ref 11.5–15.5)
WBC: 9.2 10*3/uL (ref 4.0–10.5)
nRBC: 0 % (ref 0.0–0.2)

## 2023-06-21 LAB — POC OCCULT BLOOD, ED: Fecal Occult Bld: NEGATIVE

## 2023-06-21 MED ORDER — IOHEXOL 300 MG/ML  SOLN
75.0000 mL | Freq: Once | INTRAMUSCULAR | Status: AC | PRN
  Administered 2023-06-21: 75 mL via INTRAVENOUS

## 2023-06-21 MED ORDER — FENTANYL CITRATE PF 50 MCG/ML IJ SOSY
50.0000 ug | PREFILLED_SYRINGE | Freq: Once | INTRAMUSCULAR | Status: AC
  Administered 2023-06-21: 50 ug via INTRAVENOUS
  Filled 2023-06-21: qty 1

## 2023-06-21 MED ORDER — MORPHINE SULFATE (PF) 4 MG/ML IV SOLN: 4.0000 mg | Freq: Once | INTRAVENOUS | Status: DC

## 2023-06-21 MED ORDER — VANCOMYCIN HCL 2000 MG/400ML IV SOLN
2000.0000 mg | Freq: Once | INTRAVENOUS | Status: DC
  Filled 2023-06-21: qty 400

## 2023-06-21 MED ORDER — LACTATED RINGERS IV BOLUS
1000.0000 mL | Freq: Once | INTRAVENOUS | Status: AC
  Administered 2023-06-21: 1000 mL via INTRAVENOUS

## 2023-06-21 MED ORDER — DEXAMETHASONE SODIUM PHOSPHATE 10 MG/ML IJ SOLN
10.0000 mg | Freq: Once | INTRAMUSCULAR | Status: AC
  Administered 2023-06-21: 10 mg via INTRAVENOUS
  Filled 2023-06-21: qty 1

## 2023-06-21 MED ORDER — ONDANSETRON HCL 4 MG/2ML IJ SOLN
4.0000 mg | Freq: Once | INTRAMUSCULAR | Status: AC
  Administered 2023-06-21: 4 mg via INTRAVENOUS
  Filled 2023-06-21: qty 2

## 2023-06-21 MED ORDER — HYDROMORPHONE HCL 1 MG/ML IJ SOLN
1.0000 mg | Freq: Once | INTRAMUSCULAR | Status: AC
  Administered 2023-06-21: 1 mg via INTRAVENOUS
  Filled 2023-06-21: qty 1

## 2023-06-21 MED ORDER — PIPERACILLIN-TAZOBACTAM 3.375 G IVPB 30 MIN
3.3750 g | Freq: Once | INTRAVENOUS | Status: AC
  Administered 2023-06-21: 3.375 g via INTRAVENOUS
  Filled 2023-06-21: qty 50

## 2023-06-21 MED ORDER — SODIUM CHLORIDE 0.9 % IV SOLN
3.0000 g | Freq: Once | INTRAVENOUS | Status: AC
  Administered 2023-06-21: 3 g via INTRAVENOUS
  Filled 2023-06-21: qty 8

## 2023-06-21 MED ORDER — VANCOMYCIN HCL IN DEXTROSE 1-5 GM/200ML-% IV SOLN
1000.0000 mg | INTRAVENOUS | Status: DC
  Administered 2023-06-21: 1000 mg via INTRAVENOUS
  Filled 2023-06-21: qty 200

## 2023-06-21 MED ORDER — KETOROLAC TROMETHAMINE 30 MG/ML IJ SOLN
30.0000 mg | Freq: Once | INTRAMUSCULAR | Status: AC
  Administered 2023-06-21: 30 mg via INTRAVENOUS
  Filled 2023-06-21: qty 1

## 2023-06-21 MED ORDER — OXYCODONE HCL 5 MG PO TABS
5.0000 mg | ORAL_TABLET | Freq: Once | ORAL | Status: AC
  Administered 2023-06-21: 5 mg via ORAL
  Filled 2023-06-21: qty 1

## 2023-06-21 MED ORDER — DEXTROSE 50 % IV SOLN: 1.0000 | Freq: Once | INTRAVENOUS | Status: DC

## 2023-06-21 NOTE — ED Provider Notes (Addendum)
Jennifer [name] AT [facility] Provider Note   CSN: [id number] Arrival date & time: 06/21/23  1017     History  Chief Complaint  Patient presents with   Facial Swelling    Jennifer [name] is a 62 y.o. female.  Patient here with right-sided facial swelling and dental infection has been ongoing for last few weeks.  She was started on antibiotics a few days ago again without much improvement.  Having a hard time eating and drinking.  She is in a lot of discomfort.  She has noticed more swelling to the right lower jaw/under her neck area.  She states that she cannot afford to see a dentist.  She denies any fever or chills.  Denies any sore throat but does have pain with eating.  Denies any chest pain shortness of breath weakness numbness tingling otherwise.  The history is provided by the patient.       Home Medications Prior to Admission medications   Medication Sig Start Date End Date Taking? Authorizing Provider  amLODipine (NORVASC) 10 MG tablet TAKE 1 TABLET (10 MG TOTAL) BY MOUTH DAILY (Office visit for future refills) 05/18/23   Hoy Register, MD  amoxicillin-clavulanate (AUGMENTIN) 875-125 MG tablet Take 1 tablet by mouth every 12 (twelve) hours. 06/04/23   Sponseller, Lupe Carney R, PA-C  atorvastatin (LIPITOR) 10 MG tablet Take 1 tablet (10 mg total) by mouth daily. 05/18/23   Hoy Register, MD  cetirizine (ZYRTEC) 10 MG tablet Take 1 tablet (10 mg total) by mouth daily. 09/10/21   Mayers, Cari S, PA-C  gabapentin (NEURONTIN) 300 MG capsule Take 1 capsule (300 mg total) by mouth at bedtime. 12/25/22   Mayers, Cari S, PA-C  hydrOXYzine (ATARAX) 25 MG tablet Take 1 tablet (25 mg total) by mouth at bedtime as needed. 08/21/21   Mayers, Cari S, PA-C  ibuprofen (ADVIL) 800 MG tablet Take 1 tablet (800 mg total) by mouth 3 (three) times daily. 12/18/20   Arby Barrette, MD  ketorolac (TORADOL) 10 MG tablet Take 1 tablet (10 mg total) by mouth every 6 (six)  hours as needed. 02/03/23   Small, Brooke L, PA  lisinopril-hydrochlorothiazide (ZESTORETIC) 20-12.5 MG tablet Take 2 tablets by mouth daily. 05/18/23   Hoy Register, MD  metoprolol succinate (TOPROL-XL) 50 MG 24 hr tablet Take 1 tablet (50 mg total) by mouth daily. 05/18/23   Hoy Register, MD  naproxen (NAPROSYN) 375 MG tablet Take 1 tablet (375 mg total) by mouth 2 (two) times daily. 12/26/21   Linwood Dibbles, MD  ondansetron (ZOFRAN) 4 MG tablet Take 1 tablet (4 mg total) by mouth every 8 (eight) hours as needed for nausea or vomiting. 05/18/23   Hoy Register, MD  pantoprazole (PROTONIX) 40 MG tablet Take 1 tablet (40 mg total) by mouth daily. 05/18/23   Hoy Register, MD      Allergies    Patient has no known allergies.    Review of Systems   Review of Systems  Physical Exam Updated Vital Signs BP 128/72   Pulse 62   Temp 98.4 F (36.9 C)   Resp 16   Ht 5\' 4"  (1.626 m)   Wt 83.9 kg   LMP 12/04/2009   SpO2 100%   BMI 31.76 kg/m  Physical Exam Vitals and nursing note reviewed.  Constitutional:      General: She is not in acute distress.    Appearance: She is well-developed.  HENT:     Head: Normocephalic and  atraumatic.     Mouth/Throat:     Comments: She has some swelling of the hard palate on the right and some swelling of the uvula but there is no obvious tonsillar abscess, she is got multiple dental caries on exam, she is got submandibular swelling most specifically to the right angle of the jaw but no obvious major trismus Eyes:     Conjunctiva/sclera: Conjunctivae normal.  Cardiovascular:     Rate and Rhythm: Normal rate and regular rhythm.     Pulses: Normal pulses.     Heart sounds: Normal heart sounds. No murmur heard. Pulmonary:     Effort: Pulmonary effort is normal. No respiratory distress.     Breath sounds: Normal breath sounds.  Abdominal:     Palpations: Abdomen is soft.     Tenderness: There is no abdominal tenderness.  Genitourinary:    Comments:  Brown stool on exam Musculoskeletal:        General: No swelling.     Cervical back: Neck supple.  Skin:    General: Skin is warm and dry.     Capillary Refill: Capillary refill takes less than 2 seconds.  Neurological:     Mental Status: She is alert.  Psychiatric:        Mood and Affect: Mood normal.     ED Results / Procedures / Treatments   Labs (all labs ordered are listed, but only abnormal results are displayed) Labs Reviewed  CBC WITH DIFFERENTIAL/PLATELET - Abnormal; Notable for the following components:      Result Value   RBC 3.22 (*)    Hemoglobin 9.7 (*)    HCT 31.5 (*)    Platelets 143 (*)    All other components within normal limits  BASIC METABOLIC PANEL - Abnormal; Notable for the following components:   Potassium 3.2 (*)    Chloride 115 (*)    CO2 19 (*)    Glucose, Bld 69 (*)    Calcium 7.2 (*)    All other components within normal limits  GROUP A STREP BY PCR  CBC  OCCULT BLOOD X 1 CARD TO LAB, STOOL  POC OCCULT BLOOD, ED    EKG None  Radiology No results found.  Procedures Procedures    Medications Ordered in ED Medications  Ampicillin-Sulbactam (UNASYN) 3 g in sodium chloride 0.9 % 100 mL IVPB (0 g Intravenous Stopped 06/21/23 1158)  oxyCODONE (Oxy IR/ROXICODONE) immediate release tablet 5 mg (5 mg Oral Given 06/21/23 1109)  fentaNYL (SUBLIMAZE) injection 50 mcg (50 mcg Intravenous Given 06/21/23 1204)  HYDROmorphone (DILAUDID) injection 1 mg (1 mg Intravenous Given 06/21/23 1309)  ketorolac (TORADOL) 30 MG/ML injection 30 mg (30 mg Intravenous Given 06/21/23 1309)  dexamethasone (DECADRON) injection 10 mg (10 mg Intravenous Given 06/21/23 1309)  iohexol (OMNIPAQUE) 300 MG/ML solution 75 mL (75 mLs Intravenous Contrast Given 06/21/23 1411)    ED Course/ Medical Decision Making/ A&P                                 Medical Decision Making Amount and/or Complexity of Data Reviewed Labs: ordered. Radiology:  ordered.  Risk Prescription drug management.   MYCHELE [name] is here with facial swelling, dental pain.  History of polysubstance abuse, bipolar.  She is got a fair amount of dental caries on exam throughout.  She is got swelling to the right submandibular space, swelling to the right hard palate and  uvula.  She has no obvious trismus or drooling.  Voice is a little muffled.  Differential diagnosis likely dental versus throat infection/throat abscess.  This seems to be most likely odontogenic process.  She is having some issues with money and cannot really afford to follow-up with her dentist.  She has failed outpatient oral antibiotics as well.  She is having a hard time eating and drinking.  Overall we will give her a dose IV Unasyn and get a CBC BMP and a CT scan of her soft tissues of her neck to further evaluate.  Will give her Toradol, Decadron, Dilaudid.  Lab work was overall unremarkable per my review interpretation.  Hemoglobin is 9.7 which is fairly lower than her blood work couple weeks ago.  Her Hemoccult was negative.  I decided to redraw CBC as nurse was concerned maybe some saline was involved.  Redraw shows a hemoglobin of 12.8.  Overall I have no concern for GI bleeding or other acute anemia process.  At this time she has no significant leukocytosis.  Strep test is negative.  Awaiting CT soft tissue study to evaluate for what I suspect is odontogenic infection.  I have talked with Dr. Kenney Houseman oral surgery who is available to help.  She has already gotten IV antibiotics.   Patient handed off to oncoming ED staff with patient pending CT scan.  This chart was dictated using voice recognition software.  Despite best efforts to proofread,  errors can occur which can change the documentation meaning.     Final Clinical Impression(s) / ED Diagnoses Final diagnoses:  Facial swelling  Dental infection    Rx / DC Orders ED Discharge Orders     None         Virgina Norfolk,  DO 06/21/23 1440    Virgina Norfolk, DO 06/21/23 1457

## 2023-06-21 NOTE — ED Triage Notes (Signed)
Patient arrives ambulatory by POV c/o right sided facial swelling and infection to her mouth and throat. States she began vomiting 2 days ago. Completed dose of oral antibiotics last Friday and states swelling has not improved.

## 2023-06-21 NOTE — Progress Notes (Signed)
A consult was received from an ED physician for vancomycin  per pharmacy dosing.  The patient's profile has been reviewed for ht/wt/allergies/indication/available labs.    A one time order has been placed for vancomycin 2000 mg.    Further antibiotics/pharmacy consults should be ordered by admitting physician if indicated.                       Thank you,  Herby Abraham, Pharm.D Use secure chat for questions 06/21/2023 5:46 PM

## 2023-06-21 NOTE — ED Provider Notes (Signed)
  Physical Exam  BP 132/72   Pulse 62   Temp 98.4 F (36.9 C)   Resp 16   Ht 5\' 4"  (1.626 m)   Wt 83.9 kg   LMP 12/04/2009   SpO2 100%   BMI 31.76 kg/m   Physical Exam  Procedures  Procedures  ED Course / MDM    [place]    Past Medical History:  Diagnosis Date   bipolar    Bipolar 1 disorder (HCC)    Depression    Fibroids    now s/p hysterectomy   Hypertension    Left ventricular hypertrophy    Polysubstance abuse (HCC)    IV drug [place], cocaine on occassion, smoking and alcoholism, has been going to AA, not completely sober

## 2023-06-21 NOTE — Progress Notes (Signed)
A consult was received from an ED physician for Zosyn per pharmacy dosing.  The patient's profile has been reviewed for ht/wt/allergies/indication/available labs.    A one time order has been placed for Zosyn 3.375g IV over 30 minutes.  Further antibiotics/pharmacy consults should be ordered by admitting physician if indicated.                       Thank you, Jamse Mead 06/21/2023  4:26 PM

## 2023-06-24 ENCOUNTER — Other Ambulatory Visit: Payer: Self-pay

## 2023-06-24 MED ORDER — CLINDAMYCIN HCL 300 MG PO CAPS
300.0000 mg | ORAL_CAPSULE | Freq: Three times a day (TID) | ORAL | 0 refills | Status: AC
  Filled 2023-06-24: qty 21, 7d supply, fill #0

## 2023-06-30 ENCOUNTER — Telehealth: Payer: Self-pay

## 2023-06-30 NOTE — Telephone Encounter (Signed)
Spoke with patient Aunt. Aunt is not on the DPR. Message left with patient's aunt to call office as soon as possible. Patient's aunt replied if I see her I will. I asked if the patient had another number so that I may contact her. Patient's aunt replied " no she does not". Patient aunt voiced again I will let her know you called and have her call back. Advised aunt that she is not on Patient's DPR. Advised that if her number is the only contact number for the patient , they should consider adding her name. Patient aunt voiced she thought she was on the DPR , but she will have patient to add her as soon as possible.  Call to patient was to advise that we have obtained PA  for CT CHEST LUNG CANCER SCREENING LOW DOSE WO CONTRAST. Patient is to call [phone]([facility] centralized scheduling) to schedule the appointment and Give them the Authorization # [id number] . Please advise patient of this when she call back.

## 2023-07-06 ENCOUNTER — Other Ambulatory Visit: Payer: Self-pay

## 2023-07-07 ENCOUNTER — Ambulatory Visit (HOSPITAL_COMMUNITY): Payer: 59

## 2023-07-13 ENCOUNTER — Ambulatory Visit: Payer: 59 | Admitting: Family Medicine

## 2023-07-23 ENCOUNTER — Other Ambulatory Visit: Payer: Self-pay

## 2023-07-23 ENCOUNTER — Telehealth: Payer: Self-pay

## 2023-07-23 NOTE — Telephone Encounter (Signed)
Patient in the office today. Radiology has made several attempts to contact the patient for schedule Lung CT.  Lung CT scheduled at [facility] for 07/31/2023 at 8:30 am . Patient instructed to be at there for her appointment by 8:00 am and reframed from wearing a bra with under wire and a shirt with a zipper or metal buttons.   Patient voiced understanding of all discussed .  Appointment reminder given

## 2023-07-24 ENCOUNTER — Other Ambulatory Visit: Payer: Self-pay

## 2023-07-26 ENCOUNTER — Other Ambulatory Visit: Payer: Self-pay

## 2023-07-26 ENCOUNTER — Emergency Department (HOSPITAL_COMMUNITY)
Admission: EM | Admit: 2023-07-26 | Discharge: 2023-07-26 | Disposition: A | Discharge status: Home / Self Care | Payer: 59 | Attending: Emergency Medicine | Admitting: Emergency Medicine

## 2023-07-26 ENCOUNTER — Encounter (HOSPITAL_COMMUNITY): Payer: Self-pay

## 2023-07-26 DIAGNOSIS — K0381 Cracked tooth: Secondary | ICD-10-CM | POA: Diagnosis not present

## 2023-07-26 DIAGNOSIS — Z79899 Other long term (current) drug therapy: Secondary | ICD-10-CM | POA: Diagnosis not present

## 2023-07-26 DIAGNOSIS — K0889 Other specified disorders of teeth and supporting structures: Secondary | ICD-10-CM | POA: Diagnosis present

## 2023-07-26 DIAGNOSIS — I10 Essential (primary) hypertension: Secondary | ICD-10-CM | POA: Diagnosis not present

## 2023-07-26 DIAGNOSIS — Z5900 Homelessness unspecified: Secondary | ICD-10-CM | POA: Diagnosis not present

## 2023-07-26 DIAGNOSIS — R030 Elevated blood-pressure reading, without diagnosis of hypertension: Secondary | ICD-10-CM

## 2023-07-26 MED ORDER — ACETAMINOPHEN 325 MG PO TABS
650.0000 mg | ORAL_TABLET | Freq: Once | ORAL | Status: AC
  Administered 2023-07-26: 650 mg via ORAL
  Filled 2023-07-26: qty 2

## 2023-07-26 MED ORDER — PENICILLIN V POTASSIUM 500 MG PO TABS
500.0000 mg | ORAL_TABLET | Freq: Four times a day (QID) | ORAL | 0 refills | Status: AC
Start: 2023-07-26 — End: 2023-08-10
  Filled 2023-07-26: qty 28, 7d supply, fill #0

## 2023-07-26 MED ORDER — LISINOPRIL 10 MG PO TABS
10.0000 mg | ORAL_TABLET | Freq: Once | ORAL | Status: AC
  Administered 2023-07-26: 10 mg via ORAL
  Filled 2023-07-26: qty 1

## 2023-07-26 MED ORDER — PENICILLIN V POTASSIUM 500 MG PO TABS
500.0000 mg | ORAL_TABLET | Freq: Once | ORAL | Status: AC
Start: 2023-07-26 — End: 2023-07-26
  Administered 2023-07-26: 500 mg via ORAL
  Filled 2023-07-26: qty 1

## 2023-07-26 NOTE — Discharge Instructions (Addendum)
Please use Tylenol or ibuprofen for pain.  You may use 600 mg ibuprofen every 6 hours or 1000 mg of Tylenol every 6 hours.  You may choose to alternate between the 2.  This would be most effective.  Not to exceed 4 g of Tylenol within 24 hours.  Not to exceed 3200 mg ibuprofen 24 hours.  

## 2023-07-26 NOTE — ED Provider Notes (Signed)
[place] EMERGENCY DEPARTMENT AT [facility] Provider Note   CSN: [id number] Arrival date & time: 07/26/23  1016     History  Chief Complaint  Patient presents with   Dental Problem    Jennifer [name] is a 62 y.o. female with past medical history significant for hypertension, bipolar disorder, GERD, anxiety, currently struggling with homelessness, who presents with concern for ongoing right lower dental infection.  Patient reports that she was treated a couple weeks ago and was sent home on an antibiotic but did not complete a course secondary to it irritating her stomach.  He cannot recall what the antibiotic was however.  Additionally she reports that she is not able to pick up her blood pressure medication until tomorrow and is requesting a dose of her blood pressure medication.  She denies any fever, chills, nausea, vomiting.  HPI     Home Medications Prior to Admission medications   Medication Sig Start Date End Date Taking? Authorizing Provider  penicillin v potassium (VEETID) 500 MG tablet Take 1 tablet (500 mg total) by mouth 4 (four) times daily for 7 days. 07/26/23 08/02/23 Yes Donesha Wallander H, PA-C  amLODipine (NORVASC) 10 MG tablet TAKE 1 TABLET (10 MG TOTAL) BY MOUTH DAILY (Office visit for future refills) 05/18/23   Hoy Register, MD  amoxicillin-clavulanate (AUGMENTIN) 875-125 MG tablet Take 1 tablet by mouth every 12 (twelve) hours. 06/04/23   Sponseller, Lupe Carney R, PA-C  atorvastatin (LIPITOR) 10 MG tablet Take 1 tablet (10 mg total) by mouth daily. 05/18/23   Hoy Register, MD  cetirizine (ZYRTEC) 10 MG tablet Take 1 tablet (10 mg total) by mouth daily. 09/10/21   Mayers, Cari S, PA-C  gabapentin (NEURONTIN) 300 MG capsule Take 1 capsule (300 mg total) by mouth at bedtime. 12/25/22   Mayers, Cari S, PA-C  hydrOXYzine (ATARAX) 25 MG tablet Take 1 tablet (25 mg total) by mouth at bedtime as needed. 08/21/21   Mayers, Cari S, PA-C  ibuprofen (ADVIL)  800 MG tablet Take 1 tablet (800 mg total) by mouth 3 (three) times daily. 12/18/20   Arby Barrette, MD  ketorolac (TORADOL) 10 MG tablet Take 1 tablet (10 mg total) by mouth every 6 (six) hours as needed. 02/03/23   Small, Brooke L, PA  lisinopril-hydrochlorothiazide (ZESTORETIC) 20-12.5 MG tablet Take 2 tablets by mouth daily. 05/18/23   Hoy Register, MD  metoprolol succinate (TOPROL-XL) 50 MG 24 hr tablet Take 1 tablet (50 mg total) by mouth daily. 05/18/23   Hoy Register, MD  naproxen (NAPROSYN) 375 MG tablet Take 1 tablet (375 mg total) by mouth 2 (two) times daily. 12/26/21   Linwood Dibbles, MD  ondansetron (ZOFRAN) 4 MG tablet Take 1 tablet (4 mg total) by mouth every 8 (eight) hours as needed for nausea or vomiting. 05/18/23   Hoy Register, MD  pantoprazole (PROTONIX) 40 MG tablet Take 1 tablet (40 mg total) by mouth daily. 05/18/23   Hoy Register, MD      Allergies    Patient has no known allergies.    Review of Systems   Review of Systems  All other systems reviewed and are negative.   Physical Exam Updated Vital Signs BP (!) 151/109 (BP Location: Right Arm)   Pulse (!) 59   Temp 99 F (37.2 C) (Oral)   Resp 18   Ht 5\' 4"  (1.626 m)   Wt 83.9 kg   LMP 12/04/2009   SpO2 99%   BMI 31.76 kg/m  Physical  Exam Vitals and nursing note reviewed.  Constitutional:      General: She is not in acute distress.    Appearance: Normal appearance.  HENT:     Head: Normocephalic and atraumatic.     Mouth/Throat:     Comments: Patient has some mild soft tissue, swelling, and multiple broken teeth in the right lower jaw, no evidence of abscess, no soft tissue swelling, no floor of mouth swelling.  The anterior neck is soft, nonindurated.  Posterior oropharynx is clear, no tonsillar swelling, uvula midline.  She is able to swallow without difficulty. Eyes:     General:        Right eye: No discharge.        Left eye: No discharge.  Cardiovascular:     Rate and Rhythm: Normal rate  and regular rhythm.  Pulmonary:     Effort: Pulmonary effort is normal. No respiratory distress.  Musculoskeletal:        General: No deformity.  Skin:    General: Skin is warm and dry.  Neurological:     Mental Status: She is alert and oriented to person, place, and time.  Psychiatric:        Mood and Affect: Mood normal.        Behavior: Behavior normal.     ED Results / Procedures / Treatments   Labs (all labs ordered are listed, but only abnormal results are displayed) Labs Reviewed - No data to display  EKG None  Radiology No results found.  Procedures Procedures    Medications Ordered in ED Medications  lisinopril (ZESTRIL) tablet 10 mg (has no administration in time range)  acetaminophen (TYLENOL) tablet 650 mg (has no administration in time range)  penicillin v potassium (VEETID) tablet 500 mg (has no administration in time range)    ED Course/ Medical Decision Making/ A&P                                 Medical Decision Making Risk OTC drugs. Prescription drug management.    This an overall well-appearing 62 y.o. female who presents with concern for dental pain.  Physical exam reveals cavities, broken teeth in mouth.  Patient with redness, gum swelling without evidence of gum abscess, periapical abscess, PTA, uvula deviation, pharyngitis, epiglottitis, dysphonia, stridor.  Patient without difficulty swallowing.  No systemic fever, chills.  Given patient's symptoms think it is reasonable to treat with antibiotics.  Encouraged ibuprofen, Tylenol, Orajel, ice for pain control. Encouraged urgent dental follow-up, dental resource guide provided.  Provided penicillin for antibiotic coverage.  Additionally patient requesting a dose of her home lisinopril for blood pressure, blood pressure 151/109 in the emergency department.  She denies any chest pain, vision changes, numbness, tingling or other symptoms related to her high blood pressure, I think reasonable to give  her dose of home medication so she can pick it up tomorrow.  Patient discharged in stable condition at this time, return precautions given. Final Clinical Impression(s) / ED Diagnoses Final diagnoses:  Pain, dental  Elevated blood pressure reading    Rx / DC Orders ED Discharge Orders          Ordered    penicillin v potassium (VEETID) 500 MG tablet  4 times daily        07/26/23 1046              Lahna Nath, [place] H, PA-C 07/26/23 1049  Jacalyn Lefevre, MD 07/26/23 1105

## 2023-07-26 NOTE — ED Triage Notes (Signed)
Pt complaining of an infection on the right side of her jaw. Pt states she was treated a couple weeks ago, and was sent home on abx. Pt was unable to take the oral abx due to it upsetting her stomach. Pt noticed swelling in that area, and concerned the infection was returning. Pt also concerned that her BP is high, and asking for something to bring it down until she can pick up her meds for HTN tomorrow.

## 2023-07-27 ENCOUNTER — Other Ambulatory Visit: Payer: Self-pay

## 2023-07-31 ENCOUNTER — Ambulatory Visit (HOSPITAL_COMMUNITY): Payer: 59

## 2023-08-03 ENCOUNTER — Ambulatory Visit (HOSPITAL_COMMUNITY): Payer: 59

## 2023-08-03 ENCOUNTER — Other Ambulatory Visit: Payer: Self-pay

## 2023-08-04 ENCOUNTER — Other Ambulatory Visit: Payer: Self-pay

## 2023-08-10 ENCOUNTER — Encounter (HOSPITAL_COMMUNITY): Payer: Self-pay

## 2023-08-10 ENCOUNTER — Ambulatory Visit (HOSPITAL_COMMUNITY)
Admission: RE | Admit: 2023-08-10 | Discharge: 2023-08-10 | Disposition: A | Discharge status: Home / Self Care | Payer: 59 | Source: Ambulatory Visit | Attending: Family Medicine | Admitting: Family Medicine

## 2023-08-10 DIAGNOSIS — F17210 Nicotine dependence, cigarettes, uncomplicated: Secondary | ICD-10-CM | POA: Insufficient documentation

## 2023-08-21 ENCOUNTER — Other Ambulatory Visit: Payer: Self-pay | Admitting: Family Medicine

## 2023-08-21 DIAGNOSIS — K224 Dyskinesia of esophagus: Secondary | ICD-10-CM

## 2023-08-27 ENCOUNTER — Ambulatory Visit: Payer: Self-pay | Admitting: Internal Medicine

## 2023-08-28 ENCOUNTER — Emergency Department (HOSPITAL_COMMUNITY)
Admission: EM | Admit: 2023-08-28 | Discharge: 2023-08-28 | Disposition: A | Discharge status: Home / Self Care | Payer: Self-pay | Attending: Emergency Medicine | Admitting: Emergency Medicine

## 2023-08-28 ENCOUNTER — Other Ambulatory Visit: Payer: Self-pay

## 2023-08-28 ENCOUNTER — Encounter (HOSPITAL_COMMUNITY): Payer: Self-pay | Admitting: *Deleted

## 2023-08-28 DIAGNOSIS — K047 Periapical abscess without sinus: Secondary | ICD-10-CM | POA: Diagnosis not present

## 2023-08-28 DIAGNOSIS — K0889 Other specified disorders of teeth and supporting structures: Secondary | ICD-10-CM | POA: Diagnosis present

## 2023-08-28 DIAGNOSIS — M542 Cervicalgia: Secondary | ICD-10-CM | POA: Diagnosis not present

## 2023-08-28 MED ORDER — KETOROLAC TROMETHAMINE 30 MG/ML IJ SOLN
30.0000 mg | Freq: Once | INTRAMUSCULAR | Status: AC
  Administered 2023-08-28: 30 mg via INTRAMUSCULAR
  Filled 2023-08-28: qty 1

## 2023-08-28 MED ORDER — CLINDAMYCIN HCL 300 MG PO CAPS
450.0000 mg | ORAL_CAPSULE | Freq: Once | ORAL | Status: AC
  Administered 2023-08-28: 450 mg via ORAL
  Filled 2023-08-28: qty 1

## 2023-08-28 MED ORDER — CLINDAMYCIN HCL 150 MG PO CAPS: 450.0000 mg | ORAL_CAPSULE | Freq: Three times a day (TID) | ORAL | 0 refills | Status: AC

## 2023-08-28 NOTE — Discharge Instructions (Signed)
We are treating you for an infection with a strong antibiotic called clindamycin.  However we discussed that your pain may be coming from a deeper infection and so if you do not improve over the next 48 hours or if at any point you get worse or develop any other new/concerning symptoms then you should return to the ER or call 911.  It is very important to follow-up with a dentist as an outpatient.

## 2023-08-28 NOTE — ED Triage Notes (Addendum)
Here from home for recurrent L mandibular dental pain and swelling, radiates to L ear and throat, Onset reoccurred Wednesday, 2d ago. Reports just finished antibiotic "PCN". Prescribed here. 10/10 pain. Some nausea. Denies other sx. Alert, NAD, calm.

## 2023-08-28 NOTE — Progress Notes (Signed)
Letter mailed to patient.

## 2023-08-28 NOTE — ED Provider Notes (Addendum)
[place] EMERGENCY DEPARTMENT AT [facility] Provider Note   CSN: [id number] Arrival date & time: 08/28/23  7902     History  Chief Complaint  Patient presents with   Dental Pain    Jennifer [name] is a 63 y.o. female.  HPI 63 year old female with recurrent dental infections presents with left-sided dental pain and neck pain.  She reports that she finished antibiotics for a right-sided infection 1 week ago.  She had a little spot on the left side of her neck that she feels like is now getting bigger.  She does not have any fevers.  She has painful swallowing but is able to swallow.  Does not feel like she is short of breath.  She is also concerned because she is hypertensive but thinks that is due to pain and states she did take her blood pressure medicine this morning.  Home Medications Prior to Admission medications   Medication Sig Start Date End Date Taking? Authorizing Provider  clindamycin (CLEOCIN) 150 MG capsule Take 3 capsules (450 mg total) by mouth 3 (three) times daily for 7 days. 08/28/23 09/04/23 Yes Pricilla Loveless, MD  amLODipine (NORVASC) 10 MG tablet TAKE 1 TABLET (10 MG TOTAL) BY MOUTH DAILY (Office visit for future refills) 05/18/23   Hoy Register, MD  atorvastatin (LIPITOR) 10 MG tablet Take 1 tablet (10 mg total) by mouth daily. 05/18/23   Hoy Register, MD  cetirizine (ZYRTEC) 10 MG tablet Take 1 tablet (10 mg total) by mouth daily. 09/10/21   Mayers, Cari S, PA-C  gabapentin (NEURONTIN) 300 MG capsule Take 1 capsule (300 mg total) by mouth at bedtime. 12/25/22   Mayers, Cari S, PA-C  hydrOXYzine (ATARAX) 25 MG tablet Take 1 tablet (25 mg total) by mouth at bedtime as needed. 08/21/21   Mayers, Cari S, PA-C  ibuprofen (ADVIL) 800 MG tablet Take 1 tablet (800 mg total) by mouth 3 (three) times daily. 12/18/20   Arby Barrette, MD  ketorolac (TORADOL) 10 MG tablet Take 1 tablet (10 mg total) by mouth every 6 (six) hours as needed. 02/03/23   Small,  Brooke L, PA  lisinopril-hydrochlorothiazide (ZESTORETIC) 20-12.5 MG tablet Take 2 tablets by mouth daily. 05/18/23   Hoy Register, MD  metoprolol succinate (TOPROL-XL) 50 MG 24 hr tablet Take 1 tablet (50 mg total) by mouth daily. 05/18/23   Hoy Register, MD  naproxen (NAPROSYN) 375 MG tablet Take 1 tablet (375 mg total) by mouth 2 (two) times daily. 12/26/21   Linwood Dibbles, MD  ondansetron (ZOFRAN) 4 MG tablet Take 1 tablet (4 mg total) by mouth every 8 (eight) hours as needed for nausea or vomiting. 05/18/23   Hoy Register, MD  pantoprazole (PROTONIX) 40 MG tablet Take 1 tablet (40 mg total) by mouth daily. 05/18/23   Hoy Register, MD      Allergies    Patient has no known allergies.    Review of Systems   Review of Systems  Constitutional:  Negative for fever.  HENT:  Positive for dental problem.   Respiratory:  Negative for shortness of breath.     Physical Exam Updated Vital Signs BP (!) 156/93 (BP Location: Left Arm)   Pulse 80   Temp 98 F (36.7 C) (Oral)   Resp 18   Wt 82.6 kg   LMP 12/04/2009   SpO2 100%   BMI 31.24 kg/m  Physical Exam Vitals and nursing note reviewed.  Constitutional:      Appearance: She is well-developed.  HENT:     Head: Normocephalic and atraumatic.     Mouth/Throat:      Comments: No facial swelling or tenderness No drooling or trismus. No oropharyngeal swelling. Neck:   Cardiovascular:     Rate and Rhythm: Normal rate and regular rhythm.     Heart sounds: Normal heart sounds.  Pulmonary:     Effort: Pulmonary effort is normal.     Breath sounds: Normal breath sounds. No stridor.  Abdominal:     General: There is no distension.  Musculoskeletal:     Cervical back: Normal range of motion. No edema, rigidity or crepitus.  Skin:    General: Skin is warm and dry.  Neurological:     Mental Status: She is alert.     ED Results / Procedures / Treatments   Labs (all labs ordered are listed, but only abnormal results are  displayed) Labs Reviewed - No data to display  EKG None  Radiology No results found.  Procedures Procedures    Medications Ordered in ED Medications  ketorolac (TORADOL) 30 MG/ML injection 30 mg (has no administration in time range)  clindamycin (CLEOCIN) capsule 450 mg (has no administration in time range)    ED Course/ Medical Decision Making/ A&P                                 Medical Decision Making Amount and/or Complexity of Data Reviewed External Data Reviewed: notes.  Risk Prescription drug management.   Patient is hypertensive here but otherwise vitals are unremarkable.  No signs/symptoms of endorgan damage.  Chart review shows that in November 2024 she had a deep space neck infection that was probably from dental source.  She states this is not like that time, at least not as bad.  However with her complaint of some neck pain and no obvious findings on exam, I did discuss we could consider another CT but she declines.  She is overall well-appearing, has normal voice, no stridor, etc.  I discussed that this could be a serious missed diagnosis and she understands but would like to try oral antibiotics and something IM for pain.  I offered her a dental block but she declines.  Will give a dose of Toradol and started on clindamycin given she just finished a course of penicillin within the last week or so for a different infection.  We discussed that if symptoms worsen or do not improve or she changes her mind that she should return to the ER.        Final Clinical Impression(s) / ED Diagnoses Final diagnoses:  Dental infection    Rx / DC Orders ED Discharge Orders          Ordered    clindamycin (CLEOCIN) 150 MG capsule  3 times daily        08/28/23 1029              Pricilla Loveless, MD 08/28/23 1035

## 2023-09-01 ENCOUNTER — Other Ambulatory Visit: Payer: Self-pay

## 2023-09-01 MED ORDER — CLINDAMYCIN HCL 150 MG PO CAPS
450.0000 mg | ORAL_CAPSULE | Freq: Three times a day (TID) | ORAL | 0 refills | Status: DC
  Filled 2023-09-01: qty 60, 7d supply, fill #0

## 2023-09-08 ENCOUNTER — Other Ambulatory Visit: Payer: Self-pay

## 2023-09-08 ENCOUNTER — Encounter (HOSPITAL_COMMUNITY): Payer: Self-pay | Admitting: Emergency Medicine

## 2023-09-08 ENCOUNTER — Emergency Department (HOSPITAL_COMMUNITY)
Admission: EM | Admit: 2023-09-08 | Discharge: 2023-09-08 | Disposition: A | Discharge status: Home / Self Care | Payer: Medicaid Other | Attending: Emergency Medicine | Admitting: Emergency Medicine

## 2023-09-08 DIAGNOSIS — Z20822 Contact with and (suspected) exposure to covid-19: Secondary | ICD-10-CM | POA: Insufficient documentation

## 2023-09-08 DIAGNOSIS — G4489 Other headache syndrome: Secondary | ICD-10-CM | POA: Diagnosis not present

## 2023-09-08 DIAGNOSIS — E860 Dehydration: Secondary | ICD-10-CM | POA: Diagnosis not present

## 2023-09-08 DIAGNOSIS — J101 Influenza due to other identified influenza virus with other respiratory manifestations: Secondary | ICD-10-CM | POA: Diagnosis not present

## 2023-09-08 DIAGNOSIS — Z79899 Other long term (current) drug therapy: Secondary | ICD-10-CM | POA: Diagnosis not present

## 2023-09-08 DIAGNOSIS — I10 Essential (primary) hypertension: Secondary | ICD-10-CM | POA: Diagnosis not present

## 2023-09-08 DIAGNOSIS — J09X2 Influenza due to identified novel influenza A virus with other respiratory manifestations: Secondary | ICD-10-CM | POA: Insufficient documentation

## 2023-09-08 DIAGNOSIS — J111 Influenza due to unidentified influenza virus with other respiratory manifestations: Secondary | ICD-10-CM

## 2023-09-08 DIAGNOSIS — R509 Fever, unspecified: Secondary | ICD-10-CM | POA: Diagnosis present

## 2023-09-08 DIAGNOSIS — R110 Nausea: Secondary | ICD-10-CM | POA: Diagnosis not present

## 2023-09-08 LAB — CBC
HCT: 49.9 % — ABNORMAL HIGH (ref 36.0–46.0)
Hemoglobin: 15.8 g/dL — ABNORMAL HIGH (ref 12.0–15.0)
MCH: 28.4 pg (ref 26.0–34.0)
MCHC: 31.7 g/dL (ref 30.0–36.0)
MCV: 89.7 fL (ref 80.0–100.0)
Platelets: 168 10*3/uL (ref 150–400)
RBC: 5.56 MIL/uL — ABNORMAL HIGH (ref 3.87–5.11)
RDW: 14.9 % (ref 11.5–15.5)
WBC: 5.7 10*3/uL (ref 4.0–10.5)
nRBC: 0 % (ref 0.0–0.2)

## 2023-09-08 LAB — COMPREHENSIVE METABOLIC PANEL
ALT: 24 U/L (ref 0–44)
AST: 34 U/L (ref 15–41)
Albumin: 3.9 g/dL (ref 3.5–5.0)
Alkaline Phosphatase: 51 U/L (ref 38–126)
Anion gap: 14 (ref 5–15)
BUN: 20 mg/dL (ref 6–20)
CO2: 23 mmol/L (ref 22–32)
Calcium: 9.8 mg/dL (ref 8.9–10.3)
Chloride: 100 mmol/L (ref 98–111)
Creatinine, Ser: 1.19 mg/dL — ABNORMAL HIGH (ref 0.44–1.00)
GFR, Estimated: 52 mL/min — ABNORMAL LOW (ref >60)
Glucose, Bld: 111 mg/dL — ABNORMAL HIGH (ref 70–99)
Potassium: 3.6 mmol/L (ref 3.5–5.1)
Sodium: 137 mmol/L (ref 135–145)
Total Bilirubin: 0.5 mg/dL (ref 0.0–1.2)
Total Protein: 8.1 g/dL (ref 6.5–8.1)

## 2023-09-08 LAB — RESP PANEL BY RT-PCR (RSV, FLU A&B, COVID)  RVPGX2
Influenza A by PCR: POSITIVE — AB
Influenza B by PCR: NEGATIVE
Resp Syncytial Virus by PCR: NEGATIVE
SARS Coronavirus 2 by RT PCR: NEGATIVE

## 2023-09-08 LAB — LIPASE, BLOOD: Lipase: 45 U/L (ref 11–51)

## 2023-09-08 LAB — LACTIC ACID, PLASMA: Lactic Acid, Venous: 1.3 mmol/L (ref 0.5–1.9)

## 2023-09-08 MED ORDER — ACETAMINOPHEN 500 MG PO TABS
1000.0000 mg | ORAL_TABLET | Freq: Once | ORAL | Status: AC
  Administered 2023-09-08: 1000 mg via ORAL
  Filled 2023-09-08: qty 2

## 2023-09-08 MED ORDER — SODIUM CHLORIDE 0.9 % IV BOLUS
1000.0000 mL | Freq: Once | INTRAVENOUS | Status: AC
  Administered 2023-09-08: 1000 mL via INTRAVENOUS

## 2023-09-08 MED ORDER — IBUPROFEN 800 MG PO TABS
800.0000 mg | ORAL_TABLET | Freq: Three times a day (TID) | ORAL | 0 refills | Status: DC | PRN
  Filled 2023-09-08: qty 21, 7d supply, fill #0

## 2023-09-08 MED ORDER — ONDANSETRON 4 MG PO TBDP
4.0000 mg | ORAL_TABLET | ORAL | 0 refills | Status: DC | PRN
  Filled 2023-09-08: qty 12, 2d supply, fill #0

## 2023-09-08 MED ORDER — ONDANSETRON 4 MG PO TBDP
4.0000 mg | ORAL_TABLET | Freq: Once | ORAL | Status: AC
  Administered 2023-09-08: 4 mg via ORAL

## 2023-09-08 MED ORDER — HYDROCODONE-ACETAMINOPHEN 5-325 MG PO TABS
1.0000 | ORAL_TABLET | Freq: Once | ORAL | Status: AC
  Administered 2023-09-08: 1 via ORAL
  Filled 2023-09-08: qty 1

## 2023-09-08 MED ORDER — KETOROLAC TROMETHAMINE 30 MG/ML IJ SOLN
30.0000 mg | Freq: Once | INTRAMUSCULAR | Status: AC
Start: 2023-09-08 — End: 2023-09-08
  Administered 2023-09-08: 30 mg via INTRAVENOUS
  Filled 2023-09-08: qty 1

## 2023-09-08 MED ORDER — OSELTAMIVIR PHOSPHATE 75 MG PO CAPS
75.0000 mg | ORAL_CAPSULE | Freq: Two times a day (BID) | ORAL | 0 refills | Status: DC
  Filled 2023-09-08: qty 10, 5d supply, fill #0

## 2023-09-08 NOTE — ED Provider Notes (Signed)
[place] EMERGENCY DEPARTMENT AT [facility] Provider Note   CSN: [id number] Arrival date & time: 09/08/23  2130     History {Add pertinent medical, surgical, social history, OB history to HPI:1} Chief Complaint  Patient presents with   Abdominal Pain   Emesis    Jennifer [name] is a 63 y.o. female.  Patient has a history of hypertension.  Patient complains of nausea vomiting and fevers and aches.  Patient also took extra blood pressure medicine yesterday   Abdominal Pain Associated symptoms: vomiting   Emesis Associated symptoms: abdominal pain        Home Medications Prior to Admission medications   Medication Sig Start Date End Date Taking? Authorizing Provider  acetaminophen (TYLENOL) 500 MG tablet Take 500 mg by mouth every 6 (six) hours as needed for moderate pain (pain score 4-6).   Yes [provider]  amLODipine (NORVASC) 10 MG tablet TAKE 1 TABLET (10 MG TOTAL) BY MOUTH DAILY (Office visit for future refills) 05/18/23  Yes Newlin, Enobong, MD  atorvastatin (LIPITOR) 10 MG tablet Take 1 tablet (10 mg total) by mouth daily. 05/18/23  Yes Hoy Register, MD  ibuprofen (ADVIL) 800 MG tablet Take 1 tablet (800 mg total) by mouth every 8 (eight) hours as needed for moderate pain (pain score 4-6). 09/08/23  Yes Bethann Berkshire, MD  lisinopril-hydrochlorothiazide (ZESTORETIC) 20-12.5 MG tablet Take 2 tablets by mouth daily. 05/18/23  Yes Hoy Register, MD  metoprolol succinate (TOPROL-XL) 50 MG 24 hr tablet Take 1 tablet (50 mg total) by mouth daily. 05/18/23  Yes Hoy Register, MD  oseltamivir (TAMIFLU) 75 MG capsule Take 1 capsule (75 mg total) by mouth every 12 (twelve) hours. 09/08/23  Yes Bethann Berkshire, MD  clindamycin (CLEOCIN) 150 MG capsule Take 3 capsules (450 mg total) by mouth 3 (three) times daily for 7 days. Patient not taking: Reported on 09/08/2023 08/28/23   Pricilla Loveless, MD  gabapentin (NEURONTIN) 300 MG capsule Take 1 capsule (300  mg total) by mouth at bedtime. Patient not taking: Reported on 09/08/2023 12/25/22   Mayers, Cari S, PA-C  ondansetron (ZOFRAN) 4 MG tablet Take 1 tablet (4 mg total) by mouth every 8 (eight) hours as needed for nausea or vomiting. Patient not taking: Reported on 09/08/2023 05/18/23   Hoy Register, MD  pantoprazole (PROTONIX) 40 MG tablet Take 1 tablet (40 mg total) by mouth daily. Patient not taking: Reported on 09/08/2023 05/18/23   Hoy Register, MD      Allergies    Patient has no known allergies.    Review of Systems   Review of Systems  Gastrointestinal:  Positive for abdominal pain and vomiting.    Physical Exam Updated Vital Signs BP 100/68   Pulse 61   Temp 100.3 F (37.9 C) (Oral)   Resp 20   LMP 12/04/2009   SpO2 96%  Physical Exam  ED Results / Procedures / Treatments   Labs (all labs ordered are listed, but only abnormal results are displayed) Labs Reviewed  RESP PANEL BY RT-PCR (RSV, FLU A&B, COVID)  RVPGX2 - Abnormal; Notable for the following components:      Result Value   Influenza A by PCR POSITIVE (*)    All other components within normal limits  COMPREHENSIVE METABOLIC PANEL - Abnormal; Notable for the following components:   Glucose, Bld 111 (*)    Creatinine, Ser 1.19 (*)    GFR, Estimated 52 (*)    All other components within normal limits  CBC - Abnormal; Notable for the following components:   RBC 5.56 (*)    Hemoglobin 15.8 (*)    HCT 49.9 (*)    All other components within normal limits  CULTURE, BLOOD (ROUTINE X 2)  CULTURE, BLOOD (ROUTINE X 2)  LIPASE, BLOOD  LACTIC ACID, PLASMA  URINALYSIS, ROUTINE W REFLEX MICROSCOPIC  LACTIC ACID, PLASMA    EKG None  Radiology No results found.  Procedures Procedures  {Document cardiac monitor, telemetry assessment procedure when appropriate:1}  Medications Ordered in ED Medications  acetaminophen (TYLENOL) tablet 1,000 mg (1,000 mg Oral Given 09/08/23 1058)  ondansetron (ZOFRAN-ODT)  disintegrating tablet 4 mg (4 mg Oral Given 09/08/23 1058)  sodium chloride 0.9 % bolus 1,000 mL (0 mLs Intravenous Stopped 09/08/23 1304)  sodium chloride 0.9 % bolus 1,000 mL (1,000 mLs Intravenous New Bag/Given 09/08/23 1307)  ketorolac (TORADOL) 30 MG/ML injection 30 mg (30 mg Intravenous Given 09/08/23 1200)  sodium chloride 0.9 % bolus 1,000 mL (1,000 mLs Intravenous New Bag/Given 09/08/23 1400)  HYDROcodone-acetaminophen (NORCO/VICODIN) 5-325 MG per tablet 1 tablet (1 tablet Oral Given 09/08/23 1353)    ED Course/ Medical Decision Making/ A&P   {   Click here for ABCD2, HEART and other calculatorsREFRESH Note before signing :1}                              Medical Decision Making Amount and/or Complexity of Data Reviewed Labs: ordered.  Risk Prescription drug management.  Patient with influenza and dehydration.  She improved with IV fluids and is sent home on Zofran, Motrin and Tamiflu   {Document critical care time when appropriate:1} {Document review of labs and clinical decision tools ie heart score, Chads2Vasc2 etc:1}  {Document your independent review of radiology images, and any outside records:1} {Document your discussion with family members, caretakers, and with consultants:1} {Document social determinants of health affecting pt's care:1} {Document your decision making why or why not admission, treatments were needed:1} Final Clinical Impression(s) / ED Diagnoses Final diagnoses:  Influenza    Rx / DC Orders ED Discharge Orders          Ordered    oseltamivir (TAMIFLU) 75 MG capsule  Every 12 hours        09/08/23 1536    ibuprofen (ADVIL) 800 MG tablet  Every 8 hours PRN        09/08/23 1536

## 2023-09-08 NOTE — ED Provider Triage Note (Signed)
Emergency Medicine Provider Triage Evaluation Note  Jennifer [name] , a 63 y.o. female  was evaluated in triage.  Pt complains of flu like symptoms NVD, cough, muscle ache, fever, generalized abdominal pain.  Review of Systems  Positive: cough Negative: Chest pain, shortness of breath   Physical Exam  BP 99/63 (BP Location: Right Arm)   Pulse 71   Temp (!) 103.2 F (39.6 C) (Oral)   Resp 18   LMP 12/04/2009   SpO2 94%  Gen:   Awake, no distress   Resp:  Normal effort  MSK:   Moves extremities without difficulty  Other:    Medical Decision Making  Medically screening exam initiated at 9:10 AM.  Appropriate orders placed.  MURRAY GUZZETTA was informed that the remainder of the evaluation will be completed by another provider, this initial triage assessment does not replace that evaluation, and the importance of remaining in the ED until their evaluation is complete.  HTN, bipolar 1   Smitty Knudsen, PA-C 09/08/23 1610

## 2023-09-08 NOTE — ED Notes (Signed)
Pt. Alert and awake and requesting something to drink, provided.

## 2023-09-08 NOTE — Discharge Instructions (Signed)
Drink plenty of fluids.  Take Tylenol for any fever.  Do not take your blood pressure medicines tomorrow.  Follow-up with your family doctor if not improving

## 2023-09-08 NOTE — ED Triage Notes (Signed)
Pt here from home with c/o n/v/d , pt also had headache yesterday so she took double of all of her b/p meds

## 2023-09-13 LAB — CULTURE, BLOOD (ROUTINE X 2)
Culture: NO GROWTH
Culture: NO GROWTH

## 2023-09-18 ENCOUNTER — Other Ambulatory Visit: Payer: Self-pay

## 2023-10-07 ENCOUNTER — Encounter: Payer: Self-pay | Admitting: Family Medicine

## 2023-10-29 ENCOUNTER — Other Ambulatory Visit: Payer: Self-pay

## 2023-11-16 ENCOUNTER — Ambulatory Visit: Payer: 59 | Admitting: Family Medicine

## 2024-01-18 ENCOUNTER — Other Ambulatory Visit: Payer: Self-pay | Admitting: Family Medicine

## 2024-01-18 ENCOUNTER — Other Ambulatory Visit: Payer: Self-pay

## 2024-01-18 DIAGNOSIS — Z9189 Other specified personal risk factors, not elsewhere classified: Secondary | ICD-10-CM

## 2024-01-18 DIAGNOSIS — I10 Essential (primary) hypertension: Secondary | ICD-10-CM

## 2024-01-19 ENCOUNTER — Other Ambulatory Visit: Payer: Self-pay

## 2024-01-19 ENCOUNTER — Other Ambulatory Visit: Payer: Self-pay | Admitting: Family Medicine

## 2024-01-19 DIAGNOSIS — I10 Essential (primary) hypertension: Secondary | ICD-10-CM

## 2024-01-20 ENCOUNTER — Other Ambulatory Visit: Payer: Self-pay

## 2024-01-20 ENCOUNTER — Telehealth: Payer: Self-pay | Admitting: Family Medicine

## 2024-01-20 MED ORDER — METOPROLOL SUCCINATE ER 50 MG PO TB24
50.0000 mg | ORAL_TABLET | Freq: Every day | ORAL | 1 refills | Status: DC
  Filled 2024-01-20: qty 30, 30d supply, fill #0

## 2024-01-20 MED ORDER — LISINOPRIL-HYDROCHLOROTHIAZIDE 20-12.5 MG PO TABS
2.0000 | ORAL_TABLET | Freq: Every day | ORAL | 1 refills | Status: DC
  Filled 2024-01-20: qty 60, 30d supply, fill #0

## 2024-01-20 MED ORDER — AMLODIPINE BESYLATE 10 MG PO TABS
10.0000 mg | ORAL_TABLET | Freq: Every day | ORAL | 1 refills | Status: DC
  Filled 2024-01-20: qty 30, 30d supply, fill #0

## 2024-01-20 NOTE — Telephone Encounter (Signed)
 Patient came in asking for bus passes per protocol we are supposed to give them two. As I was handing the patient the bus pass she said I was only giving her one and my reasoning was because she has no showed her past 3 appointments in office and was here to pick up medications only from the pharmacy and I was informed that she was here two days earlier and she had received two bus passes already. The patient was upset and proceeded to direct racially derogatory remarks at me if I didn't hand her more bus passes and was demanding to speak with a supervisor but she wasn't in office that moment. Patient was verbally abusive saying I don't want to deal with this [place] girl here  I don't want to speak with her unless its someone else not her I bet if it was one of your Latino people you would favor them more I was trying to continue my work and I told her to have seat and someone would be with he shortly but the patient refused and was at my window just displaying persistent disruptive behavior while there was patients in the lobby and saying rude things as I was trying to close my window she said you don't own this window, [place]  and  just wait till they send you back and I had to step away to get assistance to handle the situation, the patient spoke with the nurses Theotis Flake and Claudetta Cuba outside the lobby while I was back with Burnett Carson and the patient left after that.

## 2024-02-15 ENCOUNTER — Emergency Department (HOSPITAL_COMMUNITY)
Admission: EM | Admit: 2024-02-15 | Discharge: 2024-02-15 | Discharge status: Against Medical Advice | Attending: Emergency Medicine | Admitting: Emergency Medicine

## 2024-02-15 DIAGNOSIS — Z0130 Encounter for examination of blood pressure without abnormal findings: Secondary | ICD-10-CM | POA: Diagnosis not present

## 2024-02-15 DIAGNOSIS — Z5321 Procedure and treatment not carried out due to patient leaving prior to being seen by health care provider: Secondary | ICD-10-CM | POA: Insufficient documentation

## 2024-02-15 NOTE — ED Notes (Signed)
 Patient came in voluntarily stating she only wanted her BP checked. She refused to answer questions for registration. First watch went out to obtain vitals on patient, patient saw her BP reading of 126/73 stated she was satisfied and did not need to be seen, left before being triaged.

## 2024-02-15 NOTE — ED Notes (Addendum)
 Patients vitals were taking out in the lobby while waiting for triage    patient states her BP was fine and would be able to wait until tomorrow to get her meds    patient was advised to return if she changed her mind about being seen

## 2024-02-17 ENCOUNTER — Other Ambulatory Visit: Payer: Self-pay

## 2024-02-17 ENCOUNTER — Other Ambulatory Visit: Payer: Self-pay | Admitting: *Deleted

## 2024-02-17 DIAGNOSIS — I10 Essential (primary) hypertension: Secondary | ICD-10-CM

## 2024-02-17 MED ORDER — AMLODIPINE BESYLATE 10 MG PO TABS
10.0000 mg | ORAL_TABLET | Freq: Every day | ORAL | 0 refills | Status: DC
  Filled 2024-02-17 (×2): qty 15, 15d supply, fill #0

## 2024-02-17 MED ORDER — METOPROLOL SUCCINATE ER 50 MG PO TB24
50.0000 mg | ORAL_TABLET | Freq: Every day | ORAL | 0 refills | Status: DC
  Filled 2024-02-17 (×2): qty 15, 15d supply, fill #0

## 2024-02-17 MED ORDER — LISINOPRIL-HYDROCHLOROTHIAZIDE 20-12.5 MG PO TABS
2.0000 | ORAL_TABLET | Freq: Every day | ORAL | 0 refills | Status: DC
  Filled 2024-02-17 (×2): qty 30, 15d supply, fill #0

## 2024-03-01 ENCOUNTER — Telehealth: Payer: Self-pay | Admitting: Family Medicine

## 2024-03-01 NOTE — Telephone Encounter (Signed)
 Pt unconfirmed appt

## 2024-03-02 ENCOUNTER — Ambulatory Visit: Admitting: Family Medicine

## 2024-03-03 ENCOUNTER — Ambulatory Visit: Admitting: Internal Medicine

## 2024-03-03 ENCOUNTER — Telehealth: Payer: Self-pay | Admitting: Family Medicine

## 2024-03-03 NOTE — Telephone Encounter (Signed)
 Called pt to confirm appt for 7/25 LVM

## 2024-03-04 ENCOUNTER — Other Ambulatory Visit: Payer: Self-pay

## 2024-03-04 ENCOUNTER — Ambulatory Visit: Attending: Internal Medicine | Admitting: Internal Medicine

## 2024-03-04 VITALS — BP 145/87 | HR 59 | Temp 98.1°F | Ht 64.0 in | Wt 179.0 lb

## 2024-03-04 DIAGNOSIS — I10 Essential (primary) hypertension: Secondary | ICD-10-CM | POA: Diagnosis not present

## 2024-03-04 DIAGNOSIS — E782 Mixed hyperlipidemia: Secondary | ICD-10-CM | POA: Diagnosis not present

## 2024-03-04 MED ORDER — LISINOPRIL-HYDROCHLOROTHIAZIDE 20-12.5 MG PO TABS
2.0000 | ORAL_TABLET | Freq: Every day | ORAL | 0 refills | Status: DC
  Filled 2024-03-04: qty 90, 45d supply, fill #0
  Filled 2024-05-20 (×2): qty 6, 3d supply, fill #1

## 2024-03-04 MED ORDER — AMLODIPINE BESYLATE 10 MG PO TABS
10.0000 mg | ORAL_TABLET | Freq: Every day | ORAL | 0 refills | Status: DC
  Filled 2024-03-04: qty 90, 90d supply, fill #0
  Filled 2024-05-20: qty 3, 3d supply, fill #1

## 2024-03-04 MED ORDER — ATORVASTATIN CALCIUM 10 MG PO TABS
10.0000 mg | ORAL_TABLET | Freq: Every day | ORAL | 0 refills | Status: DC
  Filled 2024-03-04: qty 90, 90d supply, fill #0

## 2024-03-04 MED ORDER — METOPROLOL SUCCINATE ER 50 MG PO TB24
50.0000 mg | ORAL_TABLET | Freq: Every day | ORAL | 0 refills | Status: DC
  Filled 2024-03-04: qty 90, 90d supply, fill #0
  Filled 2024-05-20: qty 3, 3d supply, fill #1

## 2024-03-04 NOTE — Progress Notes (Signed)
 [name], female    DOB: 1961/02/10  MRN: [id number]  CC: Hypertension (HTN f/u./Declined to elaborate further concerns - will speak with provider only)   Subjective: Jennifer [name] is a 63 y.o. female who presents for UC visit for med RF. PCP is Dr. Delbert Her concerns today include:  Pt with hx of HTN, tob dep, HL, GERD, tob dep, homeless  Discussed the use of AI scribe software for clinical note transcription with the patient, who gave verbal consent to proceed.  History of Present Illness Jennifer [name] is a 63 year old female with hypertension and hyperlipidemia who presents for medication refill.  She is requesting refills for her blood pressure medications, which include amlodipine  10 mg daily, lisinopril , metoprolol , and hydrochlorothiazide . She also requests a refill for her cholesterol medication.  She typically receives a three-month supply of her medications and prefers to have them sent to the pharmacy located downstairs, where she usually picks them up.    Patient Active Problem List   Diagnosis Date Noted   Mass of skin of right shoulder 02/20/2022   Chronic maxillary sinusitis 09/10/2021   Need for COVID-19 vaccine 09/10/2021   Essential hypertension 08/21/2021   Class 1 obesity due to excess calories with serious comorbidity and body mass index (BMI) of 34.0 to 34.9 in adult 08/21/2021   Alcohol use disorder, moderate, dependence (HCC)    Current smoker 08/12/2016   Homeless single person 08/12/2016   Skin lesion of left leg 03/29/2015   Trigger middle finger of right hand 01/11/2015   Discoloration of eye 01/11/2015   Bradycardia 01/11/2015   Skin lesion of scalp 07/17/2014   Leg swelling 05/20/2013   Vasomotor rhinitis 08/17/2012   Right leg pain 07/26/2012   Tooth ache 05/19/2012   Anxiety 02/26/2012   GERD (gastroesophageal reflux disease) 08/08/2011   Menopausal symptoms 07/23/2011   Mastalgia 07/23/2011   Bipolar disorder,  current episode manic without psychotic features, moderate (HCC) 12/05/2010   HORDEOLUM EXTERNUM 08/27/2010   SEBACEOUS CYST, SCALP 05/06/2010   SYNOVIAL CYST 05/03/2010   Abdominal pain 12/06/2009   DRUG ABUSE, HX OF 12/06/2009   Lipoma of shoulder 02/06/2009   FIBROIDS, UTERUS 02/06/2009   DRUG ABUSE 02/06/2009   Essential hypertension, benign 02/06/2009   PAIN IN JOINT, SHOULDER REGION 02/06/2009   Insomnia 02/06/2009   PELVIC MASS 02/06/2009     Current Outpatient Medications on File Prior to Visit  Medication Sig Dispense Refill   acetaminophen  (TYLENOL ) 500 MG tablet Take 500 mg by mouth every 6 (six) hours as needed for moderate pain (pain score 4-6). (Patient not taking: Reported on 03/04/2024)     amLODipine  (NORVASC ) 10 MG tablet TAKE 1 TABLET (10 MG TOTAL) BY MOUTH DAILY (Office visit for future refills) 15 tablet 0   atorvastatin  (LIPITOR) 10 MG tablet Take 1 tablet (10 mg total) by mouth daily. 90 tablet 1   clindamycin  (CLEOCIN ) 150 MG capsule Take 3 capsules (450 mg total) by mouth 3 (three) times daily for 7 days. (Patient not taking: Reported on 03/04/2024) 60 capsule 0   gabapentin  (NEURONTIN ) 300 MG capsule Take 1 capsule (300 mg total) by mouth at bedtime. (Patient not taking: Reported on 03/04/2024) 90 capsule 1   ibuprofen  (ADVIL ) 800 MG tablet Take 1 tablet (800 mg total) by mouth every 8 (eight) hours as needed for moderate pain (pain score 4-6). (Patient not taking: Reported on 03/04/2024) 21 tablet 0   lisinopril -hydrochlorothiazide  (ZESTORETIC ) 20-12.5 MG  tablet Take 2 tablets by mouth daily. *Must have office visit for refills* 30 tablet 0   metoprolol  succinate (TOPROL -XL) 50 MG 24 hr tablet Take 1 tablet (50 mg total) by mouth daily. *Must have office visit for refills* 15 tablet 0   ondansetron  (ZOFRAN ) 4 MG tablet Take 1 tablet (4 mg total) by mouth every 8 (eight) hours as needed for nausea or vomiting. (Patient not taking: Reported on 03/04/2024) 30 tablet 1    ondansetron  (ZOFRAN -ODT) 4 MG disintegrating tablet Take 1 tablet (4 mg total) by mouth every 4 (four) hours as needed for nausea/vomiting. (Patient not taking: Reported on 03/04/2024) 12 tablet 0   oseltamivir  (TAMIFLU ) 75 MG capsule Take 1 capsule (75 mg total) by mouth every 12 (twelve) hours. (Patient not taking: Reported on 03/04/2024) 10 capsule 0   pantoprazole  (PROTONIX ) 40 MG tablet Take 1 tablet (40 mg total) by mouth daily. (Patient not taking: Reported on 03/04/2024) 90 tablet 1   Current Facility-Administered Medications on File Prior to Visit  Medication Dose Route Frequency Provider Last Rate Last Admin   cloNIDine  (CATAPRES ) tablet 0.1 mg  0.1 mg Oral BID    0.1 mg at 05/04/23 0955    No Known Allergies  Social History   Socioeconomic History   Marital status: Single    Spouse name: Not on file   Number of children: 0   Years of education: Not on file   Highest education level: Some college, no degree  Occupational History   Not on file  Tobacco Use   Smoking status: Every Day    Current packs/day: 1.00    Average packs/day: 1 pack/day for 20.0 years (20.0 ttl pk-yrs)    Types: Cigarettes   Smokeless tobacco: Never   Tobacco comments:    Wants to quit.   Vaping Use   Vaping status: Never Used  Substance and Sexual Activity   Alcohol use: Yes    Comment: occ.   Drug use: Not Currently    Types: Cocaine, Marijuana   Sexual activity: Not Currently    Birth control/protection: Surgical  Other Topics Concern   Not on file  Social History Narrative   Financial assistance approved for 100% discount at [facility] and has [facility] card per Barnie Potters   07/19/2010         Social Drivers of Health   Financial Resource Strain: Not on file  Food Insecurity: Food Insecurity Present (03/14/2021)   Hunger Vital Sign    Worried About Running Out of Food in the Last Year: Often true    Ran Out of Food in the Last Year: Often true  Transportation Needs: Unmet Transportation Needs  (03/14/2021)   PRAPARE - Administrator, Civil Service (Medical): Yes    Lack of Transportation (Non-Medical): Yes  Physical Activity: Not on file  Stress: Not on file  Social Connections: Not on file  Intimate Partner Violence: Not on file    Family History  Problem Relation Age of Onset   Hypertension Mother    Hypertension Sister    Diabetes Father     Past Surgical History:  Procedure Laterality Date   ABDOMINAL HYSTERECTOMY     2003   BILATERAL SALPINGOOPHORECTOMY     01/2010   DILATION AND CURETTAGE OF UTERUS     EXPLORATORY LAPAROTOMY     complex pelvic mass 2011   TONSILLECTOMY      ROS: Review of Systems Negative except as stated above  PHYSICAL EXAM:  BP (!) 145/87 (BP Location: Left Arm, Patient Position: Sitting, Cuff Size: Normal)   Pulse (!) 59   Temp 98.1 F (36.7 C) (Oral)   Ht 5' 4 (1.626 m)   Wt 179 lb (81.2 kg)   LMP 12/04/2009   SpO2 96%   BMI 30.73 kg/m   Physical Exam  General appearance - older AAF in NAD Mental status - pt is irate and agitated not wanting to engage in conversation.  She repeatedly states she does not want conversation just wants her medications to be sent down stairs. She was very rude and insulting to my CMA as well. Office Manager had to speak with her prior to me coming into the room      Latest Ref Rng & Units 09/08/2023    9:18 AM 06/21/2023    1:08 PM 06/04/2023    2:57 AM  CMP  Glucose 70 - 99 mg/dL 888  69  893   BUN 6 - 20 mg/dL 20  16  13    Creatinine 0.44 - 1.00 mg/dL 8.80  9.39  8.96   Sodium 135 - 145 mmol/L 137  142  138   Potassium 3.5 - 5.1 mmol/L 3.6  3.2  3.6   Chloride 98 - 111 mmol/L 100  115  104   CO2 22 - 32 mmol/L 23  19  24    Calcium  8.9 - 10.3 mg/dL 9.8  7.2  9.8   Total Protein 6.5 - 8.1 g/dL 8.1   7.4   Total Bilirubin 0.0 - 1.2 mg/dL 0.5   0.9   Alkaline Phos 38 - 126 U/L 51   57   AST 15 - 41 U/L 34   19   ALT 0 - 44 U/L 24   17    Lipid Panel     Component Value  Date/Time   CHOL 187 02/19/2022 0958   TRIG 84 02/19/2022 0958   HDL 56 02/19/2022 0958   CHOLHDL 3.3 02/19/2022 0958   CHOLHDL 3.9 Ratio 02/06/2009 2025   VLDL 33 02/06/2009 2025   LDLCALC 116 (H) 02/19/2022 0958    CBC    Component Value Date/Time   WBC 5.7 09/08/2023 0918   RBC 5.56 (H) 09/08/2023 0918   HGB 15.8 (H) 09/08/2023 0918   HGB 15.1 12/25/2022 1234   HCT 49.9 (H) 09/08/2023 0918   HCT 47.2 (H) 12/25/2022 1234   PLT 168 09/08/2023 0918   PLT 205 12/25/2022 1234   MCV 89.7 09/08/2023 0918   MCV 92 12/25/2022 1234   MCH 28.4 09/08/2023 0918   MCHC 31.7 09/08/2023 0918   RDW 14.9 09/08/2023 0918   RDW 14.1 12/25/2022 1234   LYMPHSABS 1.0 06/21/2023 1308   LYMPHSABS 1.5 12/25/2022 1234   MONOABS 0.5 06/21/2023 1308   EOSABS 0.1 06/21/2023 1308   EOSABS 0.1 12/25/2022 1234   BASOSABS 0.0 06/21/2023 1308   BASOSABS 0.0 12/25/2022 1234    ASSESSMENT AND PLAN: 1. Essential hypertension 2. Mixed hyperlipidemia (Primary) Rfs sent on Norvasc  10 mg, Toprol  50 mg, Lis/hydrochlorothiazide  20/12.5 mg and Lipito 10 mg daily. Pt did not wish to wait for dischg summary or to stop at front desk as instructed to arrange f/u with her PCP for future refills.     Patient was given the opportunity to ask questions.  Patient verbalized understanding of the plan and was able to repeat key elements of the plan.   This documentation was completed using Paediatric nurse.  Any  transcriptional errors are unintentional.  No orders of the defined types were placed in this encounter.    Requested Prescriptions   Pending Prescriptions Disp Refills   amLODipine  (NORVASC ) 10 MG tablet 90 tablet 1    Sig: TAKE 1 TABLET (10 MG TOTAL) BY MOUTH DAILY (Office visit for future refills)   atorvastatin  (LIPITOR) 10 MG tablet 90 tablet 1    Sig: Take 1 tablet (10 mg total) by mouth daily.   lisinopril -hydrochlorothiazide  (ZESTORETIC ) 20-12.5 MG tablet 90 tablet 1    Sig:  Take 2 tablets by mouth daily. *Must have office visit for refills*   metoprolol  succinate (TOPROL -XL) 50 MG 24 hr tablet 90 tablet 1    Sig: Take 1 tablet (50 mg total) by mouth daily. *Must have office visit for refills*    No follow-ups on file.  Barnie Louder, MD, FACP

## 2024-04-12 ENCOUNTER — Telehealth: Payer: Self-pay | Admitting: Family Medicine

## 2024-04-12 NOTE — Telephone Encounter (Signed)
Pt confirmed appt 9/2

## 2024-04-13 ENCOUNTER — Ambulatory Visit: Admitting: Family Medicine

## 2024-05-20 ENCOUNTER — Other Ambulatory Visit: Payer: Self-pay | Admitting: Family Medicine

## 2024-05-20 ENCOUNTER — Other Ambulatory Visit: Payer: Self-pay

## 2024-05-20 DIAGNOSIS — I10 Essential (primary) hypertension: Secondary | ICD-10-CM

## 2024-05-24 ENCOUNTER — Other Ambulatory Visit: Payer: Self-pay | Admitting: Family Medicine

## 2024-05-24 ENCOUNTER — Telehealth: Admitting: Physician Assistant

## 2024-05-24 ENCOUNTER — Other Ambulatory Visit: Payer: Self-pay

## 2024-05-24 ENCOUNTER — Encounter: Payer: Self-pay | Admitting: Physician Assistant

## 2024-05-24 VITALS — BP 156/102 | HR 56 | Wt 182.0 lb

## 2024-05-24 DIAGNOSIS — I10 Essential (primary) hypertension: Secondary | ICD-10-CM

## 2024-05-24 DIAGNOSIS — I160 Hypertensive urgency: Secondary | ICD-10-CM

## 2024-05-24 DIAGNOSIS — N289 Disorder of kidney and ureter, unspecified: Secondary | ICD-10-CM | POA: Diagnosis not present

## 2024-05-24 DIAGNOSIS — E782 Mixed hyperlipidemia: Secondary | ICD-10-CM

## 2024-05-24 DIAGNOSIS — Z1231 Encounter for screening mammogram for malignant neoplasm of breast: Secondary | ICD-10-CM

## 2024-05-24 MED ORDER — BLOOD PRESSURE CUFF MISC
1.0000 [IU] | Freq: Every day | 0 refills | Status: AC
  Filled 2024-05-24: qty 1, fill #0

## 2024-05-24 MED ORDER — ATORVASTATIN CALCIUM 10 MG PO TABS
10.0000 mg | ORAL_TABLET | Freq: Every day | ORAL | 0 refills | Status: DC
  Filled 2024-05-24: qty 90, 90d supply, fill #0

## 2024-05-24 MED ORDER — CLONIDINE HCL 0.1 MG PO TABS
0.1000 mg | ORAL_TABLET | Freq: Once | ORAL | Status: AC
  Administered 2024-05-24: 0.1 mg via ORAL

## 2024-05-24 MED ORDER — METOPROLOL SUCCINATE ER 50 MG PO TB24
50.0000 mg | ORAL_TABLET | Freq: Every day | ORAL | 1 refills | Status: DC
  Filled 2024-05-24 (×2): qty 30, 30d supply, fill #0

## 2024-05-24 MED ORDER — AMLODIPINE BESYLATE 10 MG PO TABS
10.0000 mg | ORAL_TABLET | Freq: Every day | ORAL | 1 refills | Status: DC
  Filled 2024-05-24 (×2): qty 30, 30d supply, fill #0

## 2024-05-24 MED ORDER — LISINOPRIL-HYDROCHLOROTHIAZIDE 20-12.5 MG PO TABS
2.0000 | ORAL_TABLET | Freq: Every day | ORAL | 1 refills | Status: DC
  Filled 2024-05-24 (×2): qty 30, 15d supply, fill #0

## 2024-05-24 NOTE — Patient Instructions (Addendum)
 We will call you with today's lab results.  Please let us  know if there is anything else we can do for you  Kirk CANDIE Sage, PA-C Physician Assistant [facility] [email or web address]   How to Take Your Blood Pressure Blood pressure is a measurement of how strongly your blood is pressing against the walls of your arteries. Arteries are blood vessels that carry blood from your heart throughout your body. Your health care provider takes your blood pressure at each office visit. You can also take your own blood pressure at home with a blood pressure monitor. You may need to take your own blood pressure to: Confirm a diagnosis of high blood pressure (hypertension). Monitor your blood pressure over time. Make sure your blood pressure medicine is working. Supplies needed: Blood pressure monitor. A chair to sit in. This should be a chair where you can sit upright with your back supported. Do not sit on a soft couch or an armchair. Table or desk. Small notebook and pencil or pen. How to prepare To get the most accurate reading, avoid the following for 30 minutes before you check your blood pressure: Drinking caffeine. Drinking alcohol. Eating. Smoking. Exercising. Five minutes before you check your blood pressure: Use the bathroom and urinate so that you have an empty bladder. Sit quietly in a chair. Do not talk. How to take your blood pressure To check your blood pressure, follow the instructions in the manual that came with your blood pressure monitor. If you have a digital blood pressure monitor, the instructions may be as follows: Sit up straight in a chair. Place your feet on the floor. Do not cross your ankles or legs. Rest your left arm at the level of your heart on a table or desk or on the arm of a chair. Pull up your shirt sleeve. Wrap the blood pressure cuff around the upper part of your left arm, 1 inch (2.5 cm) above your  elbow. It is best to wrap the cuff around bare skin. Fit the cuff snugly, but not too tightly, around your arm. You should be able to place only one finger between the cuff and your arm. Position the cord so that it rests in the bend of your elbow. Press the power button. Sit quietly while the cuff inflates and deflates. Read the digital reading on the monitor screen and write the numbers down (record them) in a notebook. Wait 2-3 minutes, then repeat the steps, starting at step 1. What does my blood pressure reading mean? A blood pressure reading consists of a higher number over a lower number. Ideally, your blood pressure should be below 120/80. The first (top) number is called the systolic pressure. It is a measure of the pressure in your arteries as your heart beats. The second (bottom) number is called the diastolic pressure. It is a measure of the pressure in your arteries as the heart relaxes. Blood pressure is classified into four stages. The following are the stages for adults who do not have a short-term serious illness or a chronic condition. Systolic pressure and diastolic pressure are measured in a unit called mm Hg (millimeters of mercury).  Normal Systolic pressure: below 120. Diastolic pressure: below 80. Elevated Systolic pressure: 120-129. Diastolic pressure: below 80. Hypertension stage 1 Systolic pressure: 130-139. Diastolic pressure: 80-89. Hypertension stage 2 Systolic pressure: 140 or above. Diastolic pressure: 90 or above. You can have elevated blood pressure or hypertension even if only the systolic or only the diastolic  number in your reading is higher than normal. Follow these instructions at home: Medicines Take over-the-counter and prescription medicines only as told by your health care provider. Tell your health care provider if you are having any side effects from blood pressure medicine. General instructions Check your blood pressure as often as  recommended by your health care provider. Check your blood pressure at the same time every day. Take your monitor to the next appointment with your health care provider to make sure that: You are using it correctly. It provides accurate readings. Understand what your goal blood pressure numbers are. Keep all follow-up visits. This is important. General tips Your health care provider can suggest a reliable monitor that will meet your needs. There are several types of home blood pressure monitors. Choose a monitor that has an arm cuff. Do not choose a monitor that measures your blood pressure from your wrist or finger. Choose a cuff that wraps snugly, not too tight or too loose, around your upper arm. You should be able to fit only one finger between your arm and the cuff. You can buy a blood pressure monitor at most drugstores or online. Where to find more information American Heart Association: www.heart.org Contact a health care provider if: Your blood pressure is consistently high. Your blood pressure is suddenly low. Get help right away if: Your systolic blood pressure is higher than 180. Your diastolic blood pressure is higher than 120. These symptoms may be an emergency. Get help right away. Call 911. Do not wait to see if the symptoms will go away. Do not drive yourself to the hospital. Summary Blood pressure is a measurement of how strongly your blood is pressing against the walls of your arteries. A blood pressure reading consists of a higher number over a lower number. Ideally, your blood pressure should be below 120/80. Check your blood pressure at the same time every day. Avoid caffeine, alcohol, smoking, and exercise for 30 minutes prior to checking your blood pressure. These agents can affect the accuracy of the blood pressure reading. This information is not intended to replace advice given to you by your health care provider. Make sure you discuss any questions you have  with your health care provider. Document Revised: 04/11/2021 Document Reviewed: 04/11/2021 Elsevier Patient Education  2024 Elsevier Inc.  Glomerular Filtration Rate Test: What to Know Why am I having this test? Glomerular filtration rate (GFR) is a measurement of how well your kidneys are working. You may have this test to diagnose kidney disease, or to see how bad it is. This test is sometimes also called a calculated, or estimated, glomerular filtration rate (eGFR).  What is being tested? Your GFR is estimated or calculated based on: Creatinine level in your blood. Creatinine is a waste product from your muscles. Healthy kidneys filter creatinine out of your blood. Age. Gender. Height. Weight. What kind of sample is taken?  A blood sample is required for this test. It's usually collected by inserting a needle into a blood vessel. Tell a health care provider about: Any medical problems you have. All medicines you take. These include vitamins, herbs, eye drops, and creams. Whether you're pregnant or may be pregnant. How are the results reported? Your results will be compared with normal results for this test. Each lab has its own range for what's normal. Most lab reports include the normal ranges for each test and a note telling you if yours is high or low.  Normal range for GFR  is 90-120 mL/min. What do the results mean? A result within the reference range is considered normal, meaning that your kidneys are filtering creatinine from your blood normally. A GFR below 60 mL/min for 3 months or longer is a sign of long-term (chronic) kidney disease.  You may also have other tests to help your health care provider diagnose kidney disease. These tests may include: Pee tests. Kidney biopsy. This is when a small piece of tissue is removed from the kidney for testing. Imaging tests, such as: An ultrasound. An X-ray. An MRI. If you're diagnosed with kidney disease, your GFR can also show  your stage of kidney disease: GFR of 60-89 mL/min means stage 2, which is mild kidney disease. GFR of 30-59 mL/min means stage 3, which is moderate kidney disease. GFR of 15-29 mL/min means stage 4, which is very bad kidney disease. GFR of less than 15 mL/min means stage 5, which is kidney failure. Ask when your test results will be ready and how to get them. You may need to call or meet with your provider to get your results. Questions to ask your health care provider Ask your provider, or the department doing the test: What are my treatment options? What other tests do I need? What are my next steps? This information is not intended to replace advice given to you by your health care provider. Make sure you discuss any questions you have with your health care provider. Document Revised: 03/24/2023 Document Reviewed: 03/24/2023 Elsevier Patient Education  2024 [employer].

## 2024-05-24 NOTE — Telephone Encounter (Signed)
 Copied from CRM [id number]. Topic: Clinical - Prescription Issue >> May 24, 2024  1:41 PM Mia F wrote: Reason for CRM: Pt says she was supposed to have her medications sent to the pharmacy on 10/10 and pharmacy never received it. She also was told that the mobile clinic was sending rex's today but medications still weren't sent.   metoprolol  succinate (TOPROL -XL) 50 MG 24 hr tablet  amLODipine  (NORVASC ) 10 MG tablet   lisinopril -hydrochlorothiazide  (ZESTORETIC ) 20-12.5 MG tablet

## 2024-05-24 NOTE — Progress Notes (Addendum)
 Established Patient Office Visit  Subjective   [name], female    DOB: 02-08-1961  Age: 63 y.o. MRN: [id number]  Chief Complaint  Patient presents with   Hypertension   Discussed the use of AI scribe software for clinical note transcription with the patient, who gave verbal consent to proceed.  Provider evaluated patient from remote home office.  Patient was evaluated in person on the mobile unit by nursing staff. Audio visit. History of Present Illness  States that she has been out of her blood pressure medication for the past 2 days.  States that she would like a dose of clonidine  to help reduce her blood pressure at this time because she is unable to pick up her medications until tomorrow.  However, she does deny hypertensive symptoms at this time.  States that she has also been out of her cholesterol medication for the past 2 days.  Did eat approximately 4-1/2 hours ago.  Does not check blood pressure at home, is unable to afford her blood pressure cuff.  Was recently approved for Medicaid and is having dental work completed     Past Medical History:  Diagnosis Date   bipolar    Bipolar 1 disorder (HCC)    Depression    Fibroids    now s/p hysterectomy   Hypertension    Left ventricular hypertrophy    Polysubstance abuse (HCC)    IV drug us , cocaine on occassion, smoking and alcoholism, has been going to AA, not completely sober   Social History   Socioeconomic History   Marital status: Single    Spouse name: Not on file   Number of children: 0   Years of education: Not on file   Highest education level: Some college, no degree  Occupational History   Not on file  Tobacco Use   Smoking status: Every Day    Current packs/day: 1.00    Average packs/day: 1 pack/day for 20.0 years (20.0 ttl pk-yrs)    Types: Cigarettes   Smokeless tobacco: Never   Tobacco comments:    Wants to quit.   Vaping Use   Vaping status: Never Used  Substance and  Sexual Activity   Alcohol use: Yes    Comment: occ.   Drug use: Not Currently    Types: Cocaine, Marijuana   Sexual activity: Not Currently    Birth control/protection: Surgical  Other Topics Concern   Not on file  Social History Narrative   Financial assistance approved for 100% discount at [facility] and has [facility] card per Barnie Potters   07/19/2010         Social Drivers of Health   Financial Resource Strain: Not on file  Food Insecurity: Food Insecurity Present (03/14/2021)   Hunger Vital Sign    Worried About Running Out of Food in the Last Year: Often true    Ran Out of Food in the Last Year: Often true  Transportation Needs: Unmet Transportation Needs (03/14/2021)   PRAPARE - Administrator, Civil Service (Medical): Yes    Lack of Transportation (Non-Medical): Yes  Physical Activity: Not on file  Stress: Not on file  Social Connections: Not on file  Intimate Partner Violence: Not on file   Family History  Problem Relation Age of Onset   Hypertension Mother    Hypertension Sister    Diabetes Father    No Known Allergies  Review of Systems  Constitutional: Negative.   HENT: Negative.  Eyes: Negative.   Respiratory:  Negative for shortness of breath.   Cardiovascular:  Negative for chest pain.  Gastrointestinal: Negative.   Genitourinary: Negative.   Musculoskeletal: Negative.   Skin: Negative.   Neurological: Negative.   Endo/Heme/Allergies: Negative.   Psychiatric/Behavioral: Negative.        Objective:     BP (!) 156/102 (BP Location: Left Arm, Patient Position: Sitting, Cuff Size: Normal)   Pulse (!) 56   Wt 182 lb (82.6 kg)   LMP 12/04/2009   SpO2 96%   BMI 31.24 kg/m  BP Readings from Last 3 Encounters:  05/24/24 (!) 156/102  03/04/24 (!) 145/87  02/15/24 126/73   Wt Readings from Last 3 Encounters:  05/24/24 182 lb (82.6 kg)  03/04/24 179 lb (81.2 kg)  08/28/23 182 lb (82.6 kg)    Physical Exam  No physical exam completed due to  nature of visit   Assessment & Plan:   Problem List Items Addressed This Visit       Cardiovascular and Mediastinum   Essential hypertension, benign - Primary (Chronic)   Relevant Medications   atorvastatin  (LIPITOR) 10 MG tablet   Blood Pressure Monitoring (BLOOD PRESSURE CUFF) MISC   Other Relevant Orders   CBC with Differential/Platelet   Comp. Metabolic Panel (12)   Other Visit Diagnoses       Function kidney decreased         Mixed hyperlipidemia       Relevant Medications   atorvastatin  (LIPITOR) 10 MG tablet   cloNIDine  (CATAPRES ) tablet 0.1 mg (Completed)   Other Relevant Orders   Lipid panel     Encounter for screening mammogram for malignant neoplasm of breast       Relevant Orders   MM DIGITAL SCREENING BILATERAL     Hypertensive urgency       Relevant Medications   atorvastatin  (LIPITOR) 10 MG tablet   cloNIDine  (CATAPRES ) tablet 0.1 mg (Completed)      1. Essential hypertension, benign (Primary) Refills  sent.  Patient encouraged to check blood pressure at home, keep a written log and have available for all office visits.  Red flags given for prompt reevaluation - cloNIDine  (CATAPRES ) tablet 0.1 mg - CBC with Differential/Platelet - Comp. Metabolic Panel (12) - Blood Pressure Monitoring (BLOOD PRESSURE CUFF) MISC; 1 Units by Does not apply route daily.  Dispense: 1 each; Refill: 0  2. Hypertensive urgency Clonidine  given in clinic  3. Function kidney decreased Patient education given on supportive care  4. Mixed hyperlipidemia Continue current regimen, fasting labs completed today - atorvastatin  (LIPITOR) 10 MG tablet; Take 1 tablet (10 mg total) by mouth daily.  Dispense: 90 tablet; Refill: 0 - Lipid panel  5. Encounter for screening mammogram for malignant neoplasm of breast  - MM DIGITAL SCREENING BILATERAL; Future  Patient given appointment to follow-up with primary care provider, encouraged to return to mobile unit as needed   I have  reviewed the patient's medical history (PMH, PSH, Social History, Family History, Medications, and allergies) , and have been updated if relevant. I spent 30 minutes reviewing chart and  face to face time with patient.    Return in about 5 weeks (around 06/28/2024) for [facility].    Kirk RAMAN Mayers, PA-C

## 2024-05-25 ENCOUNTER — Other Ambulatory Visit: Payer: Self-pay

## 2024-05-25 LAB — CBC WITH DIFFERENTIAL/PLATELET
Basophils Absolute: 0 x10E3/uL (ref 0.0–0.2)
Basos: 0 %
EOS (ABSOLUTE): 0.1 x10E3/uL (ref 0.0–0.4)
Eos: 1 %
Hematocrit: 48.5 % — ABNORMAL HIGH (ref 34.0–46.6)
Hemoglobin: 15.6 g/dL (ref 11.1–15.9)
Immature Grans (Abs): 0 x10E3/uL (ref 0.0–0.1)
Immature Granulocytes: 0 %
Lymphocytes Absolute: 1.8 x10E3/uL (ref 0.7–3.1)
Lymphs: 27 %
MCH: 29.5 pg (ref 26.6–33.0)
MCHC: 32.2 g/dL (ref 31.5–35.7)
MCV: 92 fL (ref 79–97)
Monocytes Absolute: 0.4 x10E3/uL (ref 0.1–0.9)
Monocytes: 6 %
Neutrophils Absolute: 4.3 x10E3/uL (ref 1.4–7.0)
Neutrophils: 66 %
Platelets: 196 x10E3/uL (ref 150–450)
RBC: 5.28 x10E6/uL (ref 3.77–5.28)
RDW: 13 % (ref 11.7–15.4)
WBC: 6.6 x10E3/uL (ref 3.4–10.8)

## 2024-05-25 LAB — COMP. METABOLIC PANEL (12)
AST: 19 IU/L (ref 0–40)
Albumin: 4.3 g/dL (ref 3.9–4.9)
Alkaline Phosphatase: 60 IU/L (ref 49–135)
BUN/Creatinine Ratio: 25 (ref 12–28)
BUN: 19 mg/dL (ref 8–27)
Bilirubin Total: 0.2 mg/dL (ref 0.0–1.2)
Calcium: 10 mg/dL (ref 8.7–10.3)
Chloride: 103 mmol/L (ref 96–106)
Creatinine, Ser: 0.76 mg/dL (ref 0.57–1.00)
Globulin, Total: 2.9 g/dL (ref 1.5–4.5)
Glucose: 69 mg/dL — ABNORMAL LOW (ref 70–99)
Potassium: 4.6 mmol/L (ref 3.5–5.2)
Sodium: 141 mmol/L (ref 134–144)
Total Protein: 7.2 g/dL (ref 6.0–8.5)
eGFR: 89 mL/min/1.73 (ref >59)

## 2024-05-25 LAB — LIPID PANEL
Chol/HDL Ratio: 2.7 ratio (ref 0.0–4.4)
Cholesterol, Total: 181 mg/dL (ref 100–199)
HDL: 68 mg/dL (ref >39)
LDL Chol Calc (NIH): 98 mg/dL (ref 0–99)
Triglycerides: 84 mg/dL (ref 0–149)
VLDL Cholesterol Cal: 15 mg/dL (ref 5–40)

## 2024-05-26 ENCOUNTER — Ambulatory Visit: Payer: Self-pay | Admitting: Physician Assistant

## 2024-06-02 NOTE — Progress Notes (Signed)
 Name and DOB verified, results and provider recommendations discussed with pt. Pt stated understanding

## 2024-06-07 ENCOUNTER — Telehealth: Payer: Self-pay | Admitting: Family Medicine

## 2024-06-07 ENCOUNTER — Other Ambulatory Visit: Payer: Self-pay | Admitting: Family Medicine

## 2024-06-07 DIAGNOSIS — E782 Mixed hyperlipidemia: Secondary | ICD-10-CM

## 2024-06-07 DIAGNOSIS — I160 Hypertensive urgency: Secondary | ICD-10-CM

## 2024-06-07 DIAGNOSIS — Z1231 Encounter for screening mammogram for malignant neoplasm of breast: Secondary | ICD-10-CM

## 2024-06-07 DIAGNOSIS — N289 Disorder of kidney and ureter, unspecified: Secondary | ICD-10-CM

## 2024-06-07 DIAGNOSIS — I10 Essential (primary) hypertension: Secondary | ICD-10-CM

## 2024-06-07 NOTE — Telephone Encounter (Unsigned)
 Copied from CRM [id number]. Topic: Clinical - Refused Triage >> Jun 07, 2024  9:24 AM Treva T wrote: Patient/caller voiced complaints of worsening abdominal/stomach pain, bloating, and not getting any better. Declined transfer to triage.  Patient demanded to speak directly to office, was rude in making the request, stating You do not need to know all of my business, and why are you asking questions. She goes on to yell and say, I do not want to speak to you  I attempted to explain to patient, but she started to become irate, and scream. I informed the patient I was placing her on hold for further assistance from office, as she refused to speak to Nurse Triage.   Called and spoke to office, Rashima, to inform of above and patient request, while on hold patient hung up.  Per Rashima to contact patient back to assist.

## 2024-06-21 ENCOUNTER — Other Ambulatory Visit: Payer: Self-pay

## 2024-06-21 ENCOUNTER — Emergency Department (HOSPITAL_COMMUNITY)
Admission: EM | Admit: 2024-06-21 | Discharge: 2024-06-21 | Disposition: A | Discharge status: Home / Self Care | Attending: Emergency Medicine | Admitting: Emergency Medicine

## 2024-06-21 ENCOUNTER — Encounter (HOSPITAL_COMMUNITY): Payer: Self-pay

## 2024-06-21 DIAGNOSIS — K029 Dental caries, unspecified: Secondary | ICD-10-CM | POA: Insufficient documentation

## 2024-06-21 DIAGNOSIS — I159 Secondary hypertension, unspecified: Secondary | ICD-10-CM | POA: Diagnosis not present

## 2024-06-21 DIAGNOSIS — I10 Essential (primary) hypertension: Secondary | ICD-10-CM | POA: Diagnosis not present

## 2024-06-21 DIAGNOSIS — Z79899 Other long term (current) drug therapy: Secondary | ICD-10-CM | POA: Insufficient documentation

## 2024-06-21 DIAGNOSIS — K0889 Other specified disorders of teeth and supporting structures: Secondary | ICD-10-CM | POA: Diagnosis present

## 2024-06-21 MED ORDER — METOPROLOL SUCCINATE ER 50 MG PO TB24: 50.0000 mg | ORAL_TABLET | Freq: Every day | ORAL | 1 refills | Status: DC

## 2024-06-21 MED ORDER — LISINOPRIL 20 MG PO TABS
20.0000 mg | ORAL_TABLET | Freq: Once | ORAL | Status: AC
  Administered 2024-06-21: 20 mg via ORAL
  Filled 2024-06-21: qty 1

## 2024-06-21 MED ORDER — LISINOPRIL-HYDROCHLOROTHIAZIDE 20-12.5 MG PO TABS: 2.0000 | ORAL_TABLET | Freq: Every day | ORAL | 1 refills | Status: DC

## 2024-06-21 MED ORDER — AMOXICILLIN-POT CLAVULANATE 875-125 MG PO TABS
1.0000 | ORAL_TABLET | Freq: Once | ORAL | Status: AC
  Administered 2024-06-21: 1 via ORAL
  Filled 2024-06-21: qty 1

## 2024-06-21 MED ORDER — METOPROLOL SUCCINATE ER 25 MG PO TB24
25.0000 mg | ORAL_TABLET | Freq: Every day | ORAL | Status: DC
  Administered 2024-06-21: 25 mg via ORAL
  Filled 2024-06-21: qty 1

## 2024-06-21 MED ORDER — AMLODIPINE BESYLATE 5 MG PO TABS
10.0000 mg | ORAL_TABLET | Freq: Once | ORAL | Status: AC
  Administered 2024-06-21: 10 mg via ORAL
  Filled 2024-06-21: qty 2

## 2024-06-21 MED ORDER — AMLODIPINE BESYLATE 10 MG PO TABS: 10.0000 mg | ORAL_TABLET | Freq: Every day | ORAL | 1 refills | Status: DC

## 2024-06-21 MED ORDER — AMOXICILLIN-POT CLAVULANATE 875-125 MG PO TABS: 1.0000 | ORAL_TABLET | Freq: Two times a day (BID) | ORAL | 0 refills | Status: AC

## 2024-06-21 NOTE — ED Triage Notes (Signed)
 Here by POV from home for toothache. Onset 1 week ago. Concerned about elevated BP. Was unable to get into see dentist.  Alert, NAD, calm, interactive, resps e/u, speaking in clear complete sentences. Steady gait.

## 2024-06-21 NOTE — Discharge Instructions (Signed)
 You were seen in the emergency room for high blood pressure and dental infection We refilled your 3 blood pressure medications to your [facility] We also gave you a short course of antibiotics to help with the dental infection Follow-up with your dentist as soon as possible Follow-up with your primary doctor within 1 week for reevaluation Return to the Emergency Department for fevers chills trouble swallowing, severe pain chest pain trouble breathing or any other concerns

## 2024-06-21 NOTE — ED Provider Notes (Signed)
 [place] EMERGENCY DEPARTMENT AT [facility] Provider Note   CSN: [id number] Arrival date & time: 06/21/24  1014     Patient presents with: Dental Pain   Jennifer [name] is a 63 y.o. female.  With a history of hypertension and dental caries who presents to the ED for hypertension.  Patient has run out of blood pressure medications amlodipine  lisinopril /HCTZ (Zestoretic ) and Toprol -XL.  She is requesting refill.  She is supposed to see a dentist for lower dental caries but had to change dentist due to insurance coverage.  No current antibiotics.  Able to eat drink and swallow.  Denies fevers chills chest pain shortness of breath.    Dental Pain      Prior to Admission medications   Medication Sig Start Date End Date Taking? Authorizing Provider  amoxicillin -clavulanate (AUGMENTIN ) 875-125 MG tablet Take 1 tablet by mouth every 12 (twelve) hours for 7 days. 06/21/24 06/28/24 Yes Pamella Ozell LABOR, DO  acetaminophen  (TYLENOL ) 500 MG tablet Take 500 mg by mouth every 6 (six) hours as needed for moderate pain (pain score 4-6). Patient not taking: Reported on 03/04/2024    [provider]  amLODipine  (NORVASC ) 10 MG tablet TAKE 1 TABLET (10 MG TOTAL) BY MOUTH DAILY (Office visit for future refills) 06/21/24   Pamella Ozell LABOR, DO  atorvastatin  (LIPITOR) 10 MG tablet Take 1 tablet (10 mg total) by mouth daily. 05/24/24   Mayers, Cari S, PA-C  Blood Pressure Monitoring (BLOOD PRESSURE CUFF) MISC 1 Units by Does not apply route daily. 05/24/24   Mayers, Cari S, PA-C  clindamycin  (CLEOCIN ) 150 MG capsule Take 3 capsules (450 mg total) by mouth 3 (three) times daily for 7 days. Patient not taking: Reported on 03/04/2024 08/28/23   Freddi Hamilton, MD  gabapentin  (NEURONTIN ) 300 MG capsule Take 1 capsule (300 mg total) by mouth at bedtime. Patient not taking: Reported on 03/04/2024 12/25/22   Mayers, Cari S, PA-C  ibuprofen  (ADVIL ) 800 MG tablet Take 1 tablet (800 mg total) by  mouth every 8 (eight) hours as needed for moderate pain (pain score 4-6). Patient not taking: Reported on 03/04/2024 09/08/23   Zammit, Joseph, MD  lisinopril -hydrochlorothiazide  (ZESTORETIC ) 20-12.5 MG tablet Take 2 tablets by mouth daily. 06/21/24   Pamella Ozell LABOR, DO  metoprolol  succinate (TOPROL -XL) 50 MG 24 hr tablet Take 1 tablet (50 mg total) by mouth daily. 06/21/24   Pamella Ozell LABOR, DO  ondansetron  (ZOFRAN ) 4 MG tablet Take 1 tablet (4 mg total) by mouth every 8 (eight) hours as needed for nausea or vomiting. Patient not taking: Reported on 03/04/2024 05/18/23   Newlin, Enobong, MD  ondansetron  (ZOFRAN -ODT) 4 MG disintegrating tablet Take 1 tablet (4 mg total) by mouth every 4 (four) hours as needed for nausea/vomiting. Patient not taking: Reported on 03/04/2024 09/08/23   Zammit, Joseph, MD  oseltamivir  (TAMIFLU ) 75 MG capsule Take 1 capsule (75 mg total) by mouth every 12 (twelve) hours. Patient not taking: Reported on 03/04/2024 09/08/23   Zammit, Joseph, MD  pantoprazole  (PROTONIX ) 40 MG tablet Take 1 tablet (40 mg total) by mouth daily. Patient not taking: Reported on 03/04/2024 05/18/23   Newlin, Enobong, MD    Allergies: Patient has no known allergies.    Review of Systems  Updated Vital Signs BP (!) 141/92   Pulse 64   Temp 98.3 F (36.8 C) (Oral)   Resp 20   Ht 5' 4 (1.626 m)   Wt 81.6 kg   LMP 12/04/2009  SpO2 100%   BMI 30.90 kg/m   Physical Exam Vitals and nursing note reviewed.  HENT:     Head: Normocephalic and atraumatic.     Mouth/Throat:     Comments: Dental caries bilateral lower molars with no evidence of abscess infection Eyes:     Pupils: Pupils are equal, round, and reactive to light.  Cardiovascular:     Rate and Rhythm: Normal rate and regular rhythm.  Pulmonary:     Effort: Pulmonary effort is normal.     Breath sounds: Normal breath sounds.  Abdominal:     Palpations: Abdomen is soft.     Tenderness: There is no abdominal tenderness.   Skin:    General: Skin is warm and dry.  Neurological:     Mental Status: She is alert.  Psychiatric:        Mood and Affect: Mood normal.     (all labs ordered are listed, but only abnormal results are displayed) Labs Reviewed - No data to display  EKG: None  Radiology: No results found.   Procedures   Medications Ordered in the ED  metoprolol  succinate (TOPROL -XL) 24 hr tablet 25 mg (has no administration in time range)  amLODipine  (NORVASC ) tablet 10 mg (has no administration in time range)  lisinopril  (ZESTRIL ) tablet 20 mg (has no administration in time range)  amoxicillin -clavulanate (AUGMENTIN ) 875-125 MG per tablet 1 tablet (has no administration in time range)                                    Medical Decision Making 63 year old female with history as above presented to ED for reported hypertension.  Ran out of antihypertensive regimen.  No chest pain shortness of breath.  Ongoing dental infection and has upcoming plan to see a new dentist but no appointment scheduled yet.  No current antibiotics.  Reports increasing pain.  No evidence of abscess that would require drainage.  Will discharge with refills for antihypertensive regimen and short course of Augmentin  for concern of smoldering dental infection  Risk Prescription drug management.        Final diagnoses:  Dental caries  Secondary hypertension    ED Discharge Orders          Ordered    amoxicillin -clavulanate (AUGMENTIN ) 875-125 MG tablet  Every 12 hours        06/21/24 1101    lisinopril -hydrochlorothiazide  (ZESTORETIC ) 20-12.5 MG tablet  Daily        06/21/24 1101    metoprolol  succinate (TOPROL -XL) 50 MG 24 hr tablet  Daily        06/21/24 1101    amLODipine  (NORVASC ) 10 MG tablet  Daily        06/21/24 1101               Pamella Sharper A, DO 06/21/24 1107

## 2024-06-21 NOTE — ED Triage Notes (Signed)
 Pt states she also needs a refill of her blood pressure medication. Pt takes lisinopril , metoprolol  and atenolol  per pt.

## 2024-06-28 ENCOUNTER — Telehealth: Payer: Self-pay | Admitting: Family Medicine

## 2024-06-28 ENCOUNTER — Ambulatory Visit: Admitting: Family Medicine

## 2024-06-28 NOTE — Telephone Encounter (Signed)
 Contacted patient, No answer. Unable to leave VM.

## 2024-06-30 ENCOUNTER — Ambulatory Visit

## 2024-07-13 ENCOUNTER — Telehealth: Payer: Self-pay | Admitting: Family Medicine

## 2024-07-13 NOTE — Telephone Encounter (Signed)
 Pt unconfirmed appt 12/3 (per vr ) lvm

## 2024-07-14 ENCOUNTER — Other Ambulatory Visit: Payer: Self-pay

## 2024-07-14 ENCOUNTER — Ambulatory Visit: Attending: Family Medicine | Admitting: Family Medicine

## 2024-07-14 ENCOUNTER — Encounter: Payer: Self-pay | Admitting: Family Medicine

## 2024-07-14 VITALS — BP 116/75 | HR 55 | Temp 98.1°F | Ht 64.0 in | Wt 180.0 lb

## 2024-07-14 DIAGNOSIS — Z1159 Encounter for screening for other viral diseases: Secondary | ICD-10-CM

## 2024-07-14 DIAGNOSIS — Z23 Encounter for immunization: Secondary | ICD-10-CM

## 2024-07-14 DIAGNOSIS — E782 Mixed hyperlipidemia: Secondary | ICD-10-CM

## 2024-07-14 DIAGNOSIS — I10 Essential (primary) hypertension: Secondary | ICD-10-CM | POA: Diagnosis not present

## 2024-07-14 DIAGNOSIS — K219 Gastro-esophageal reflux disease without esophagitis: Secondary | ICD-10-CM

## 2024-07-14 DIAGNOSIS — F17210 Nicotine dependence, cigarettes, uncomplicated: Secondary | ICD-10-CM

## 2024-07-14 DIAGNOSIS — K224 Dyskinesia of esophagus: Secondary | ICD-10-CM

## 2024-07-14 DIAGNOSIS — F1020 Alcohol dependence, uncomplicated: Secondary | ICD-10-CM

## 2024-07-14 DIAGNOSIS — R7303 Prediabetes: Secondary | ICD-10-CM | POA: Diagnosis not present

## 2024-07-14 MED ORDER — ESOMEPRAZOLE MAGNESIUM 40 MG PO CPDR
40.0000 mg | DELAYED_RELEASE_CAPSULE | Freq: Every day | ORAL | 3 refills | Status: DC
  Filled 2024-07-14: qty 30, 30d supply, fill #0

## 2024-07-14 MED ORDER — AMLODIPINE BESYLATE 10 MG PO TABS
10.0000 mg | ORAL_TABLET | Freq: Every day | ORAL | 1 refills | Status: AC
  Filled 2024-07-14: qty 30, 30d supply, fill #0

## 2024-07-14 MED ORDER — ATORVASTATIN CALCIUM 10 MG PO TABS
10.0000 mg | ORAL_TABLET | Freq: Every day | ORAL | 1 refills | Status: AC
  Filled 2024-07-14: qty 90, 90d supply, fill #0

## 2024-07-14 MED ORDER — METOPROLOL SUCCINATE ER 50 MG PO TB24
50.0000 mg | ORAL_TABLET | Freq: Every day | ORAL | 1 refills | Status: AC
  Filled 2024-07-14: qty 90, 90d supply, fill #0

## 2024-07-14 MED ORDER — LISINOPRIL-HYDROCHLOROTHIAZIDE 20-12.5 MG PO TABS
2.0000 | ORAL_TABLET | Freq: Every day | ORAL | 1 refills | Status: AC
  Filled 2024-07-14: qty 30, 15d supply, fill #0

## 2024-07-14 NOTE — Progress Notes (Signed)
 Subjective:  [name], female    DOB: 07/08/1961  Age: 63 y.o. MRN: [id number]  CC: Medical Management of Chronic Issues (Acid reflux/Discuss BP medication/Requesting HIV testing)     Discussed the use of AI scribe software for clinical note transcription with the patient, who gave verbal consent to proceed.  History of Present Illness Jennifer [name] is a 63 year old female with a history of hypertension, nicotine dependence (1 pack/day for over 25 years), alcohol dependence, GERD, bipolar disorder homelessness who presents for an HIV test and discussion of blood pressure management.  She notes intermittent high blood pressure readings around 140/90 mmHg when checked at the pharmacy and sometimes doubles her blood pressure medications on her own when readings are high. She is taking amlodipine  10 mg and lisinopril /hydrochlorothiazide  and notices higher readings after drinking coffee.  Of note she also smokes and is not interested in quitting.  She reports recurrent early morning acid reflux with vomiting, worse with alcohol and improved when she does not drink alcohol. Symptoms often occur around 4:30 AM. She was prescribed pantoprazole  but stopped it because of 'stomach swelling' and feeling overly full and prefers to avoid medication despite ongoing symptoms.  When she drinks alcohol she notes she vomits throughout the night.  Previously referred to GI but at the time she had no medical coverage and did not follow through with her appointment.  She smokes about one pack per day for over 25 years and is not interested in quitting. She drinks alcohol, which worsens her reflux symptoms.    Past Medical History:  Diagnosis Date   bipolar    Bipolar 1 disorder (HCC)    Depression    Fibroids    now s/p hysterectomy   Hypertension    Left ventricular hypertrophy    Polysubstance abuse (HCC)    IV drug us , cocaine on occassion, smoking and alcoholism, has been going  to AA, not completely sober    Past Surgical History:  Procedure Laterality Date   ABDOMINAL HYSTERECTOMY     2003   BILATERAL SALPINGOOPHORECTOMY     01/2010   DILATION AND CURETTAGE OF UTERUS     EXPLORATORY LAPAROTOMY     complex pelvic mass 2011   TONSILLECTOMY      Family History  Problem Relation Age of Onset   Hypertension Mother    Hypertension Sister    Diabetes Father     Social History   Socioeconomic History   Marital status: Single    Spouse name: Not on file   Number of children: 0   Years of education: Not on file   Highest education level: Some college, no degree  Occupational History   Not on file  Tobacco Use   Smoking status: Every Day    Current packs/day: 1.00    Average packs/day: 1 pack/day for 20.0 years (20.0 ttl pk-yrs)    Types: Cigarettes   Smokeless tobacco: Never   Tobacco comments:    Wants to quit.   Vaping Use   Vaping status: Never Used  Substance and Sexual Activity   Alcohol use: Yes    Comment: occ.   Drug use: Not Currently    Types: Cocaine, Marijuana   Sexual activity: Not Currently    Birth control/protection: Surgical  Other Topics Concern   Not on file  Social History Narrative   Financial assistance approved for 100% discount at [facility] and has [facility] card per [employer]  07/19/2010         Social Drivers of Health   Financial Resource Strain: Not on file  Food Insecurity: Food Insecurity Present (03/14/2021)   Hunger Vital Sign    Worried About Running Out of Food in the Last Year: Often true    Ran Out of Food in the Last Year: Often true  Transportation Needs: Unmet Transportation Needs (03/14/2021)   PRAPARE - Administrator, Civil Service (Medical): Yes    Lack of Transportation (Non-Medical): Yes  Physical Activity: Not on file  Stress: Not on file  Social Connections: Not on file    No Known Allergies  Outpatient Medications Prior to Visit  Medication Sig Dispense Refill   amLODipine   (NORVASC ) 10 MG tablet TAKE 1 TABLET (10 MG TOTAL) BY MOUTH DAILY (Office visit for future refills) 30 tablet 1   atorvastatin  (LIPITOR) 10 MG tablet Take 1 tablet (10 mg total) by mouth daily. 90 tablet 0   lisinopril -hydrochlorothiazide  (ZESTORETIC ) 20-12.5 MG tablet Take 2 tablets by mouth daily. 30 tablet 1   metoprolol  succinate (TOPROL -XL) 50 MG 24 hr tablet Take 1 tablet (50 mg total) by mouth daily. 30 tablet 1   Blood Pressure Monitoring (BLOOD PRESSURE CUFF) MISC 1 Units by Does not apply route daily. (Patient not taking: Reported on 07/14/2024) 1 each 0   acetaminophen  (TYLENOL ) 500 MG tablet Take 500 mg by mouth every 6 (six) hours as needed for moderate pain (pain score 4-6). (Patient not taking: Reported on 07/14/2024)     clindamycin  (CLEOCIN ) 150 MG capsule Take 3 capsules (450 mg total) by mouth 3 (three) times daily for 7 days. (Patient not taking: Reported on 07/14/2024) 60 capsule 0   gabapentin  (NEURONTIN ) 300 MG capsule Take 1 capsule (300 mg total) by mouth at bedtime. (Patient not taking: Reported on 07/14/2024) 90 capsule 1   ibuprofen  (ADVIL ) 800 MG tablet Take 1 tablet (800 mg total) by mouth every 8 (eight) hours as needed for moderate pain (pain score 4-6). (Patient not taking: Reported on 07/14/2024) 21 tablet 0   ondansetron  (ZOFRAN ) 4 MG tablet Take 1 tablet (4 mg total) by mouth every 8 (eight) hours as needed for nausea or vomiting. (Patient not taking: Reported on 07/14/2024) 30 tablet 1   ondansetron  (ZOFRAN -ODT) 4 MG disintegrating tablet Take 1 tablet (4 mg total) by mouth every 4 (four) hours as needed for nausea/vomiting. (Patient not taking: Reported on 07/14/2024) 12 tablet 0   oseltamivir  (TAMIFLU ) 75 MG capsule Take 1 capsule (75 mg total) by mouth every 12 (twelve) hours. (Patient not taking: Reported on 07/14/2024) 10 capsule 0   pantoprazole  (PROTONIX ) 40 MG tablet Take 1 tablet (40 mg total) by mouth daily. (Patient not taking: Reported on 07/14/2024) 90 tablet 1    Facility-Administered Medications Prior to Visit  Medication Dose Route Frequency Provider Last Rate Last Admin   cloNIDine  (CATAPRES ) tablet 0.1 mg  0.1 mg Oral BID    0.1 mg at 05/04/23 0955     ROS Review of Systems  Constitutional:  Negative for activity change and appetite change.  HENT:  Negative for sinus pressure and sore throat.   Respiratory:  Negative for chest tightness, shortness of breath and wheezing.   Cardiovascular:  Negative for chest pain and palpitations.  Gastrointestinal:  Positive for nausea and vomiting. Negative for abdominal distention, abdominal pain and constipation.  Genitourinary: Negative.   Musculoskeletal: Negative.   Psychiatric/Behavioral:  Negative for behavioral problems and dysphoric mood.  Objective:  BP 116/75   Pulse (!) 55   Temp 98.1 F (36.7 C) (Oral)   Ht 5' 4 (1.626 m)   Wt 180 lb (81.6 kg)   LMP 12/04/2009   SpO2 96%   BMI 30.90 kg/m      07/14/2024    9:22 AM 06/21/2024   10:38 AM 06/21/2024   10:19 AM  BP/Weight  Systolic BP 116  858  Diastolic BP 75  92  Wt. (Lbs) 180 180   BMI 30.9 kg/m2 30.9 kg/m2       Physical Exam Constitutional:      Appearance: She is well-developed.  Cardiovascular:     Rate and Rhythm: Bradycardia present.     Heart sounds: Normal heart sounds. No murmur heard. Pulmonary:     Effort: Pulmonary effort is normal.     Breath sounds: Normal breath sounds. No wheezing or rales.  Chest:     Chest wall: No tenderness.  Abdominal:     General: Bowel sounds are normal. There is no distension.     Palpations: Abdomen is soft. There is no mass.     Tenderness: There is no abdominal tenderness.  Musculoskeletal:        General: Normal range of motion.     Right lower leg: No edema.     Left lower leg: No edema.  Neurological:     Mental Status: She is alert and oriented to person, place, and time.  Psychiatric:        Mood and Affect: Mood normal.        Latest Ref Rng &  Units 05/24/2024    1:10 PM 09/08/2023    9:18 AM 06/21/2023    1:08 PM  CMP  Glucose 70 - 99 mg/dL 69  888  69   BUN 8 - 27 mg/dL 19  20  16    Creatinine 0.57 - 1.00 mg/dL 9.23  8.80  9.39   Sodium 134 - 144 mmol/L 141  137  142   Potassium 3.5 - 5.2 mmol/L 4.6  3.6  3.2   Chloride 96 - 106 mmol/L 103  100  115   CO2 22 - 32 mmol/L  23  19   Calcium  8.7 - 10.3 mg/dL 89.9  9.8  7.2   Total Protein 6.0 - 8.5 g/dL 7.2  8.1    Total Bilirubin 0.0 - 1.2 mg/dL 0.2  0.5    Alkaline Phos 49 - 135 IU/L 60  51    AST 0 - 40 IU/L 19  34    ALT 0 - 44 U/L  24      Lipid Panel     Component Value Date/Time   CHOL 181 05/24/2024 1310   TRIG 84 05/24/2024 1310   HDL 68 05/24/2024 1310   CHOLHDL 2.7 05/24/2024 1310   CHOLHDL 3.9 Ratio 02/06/2009 2025   VLDL 33 02/06/2009 2025   LDLCALC 98 05/24/2024 1310    CBC    Component Value Date/Time   WBC 6.6 05/24/2024 1310   WBC 5.7 09/08/2023 0918   RBC 5.28 05/24/2024 1310   RBC 5.56 (H) 09/08/2023 0918   HGB 15.6 05/24/2024 1310   HCT 48.5 (H) 05/24/2024 1310   PLT 196 05/24/2024 1310   MCV 92 05/24/2024 1310   MCH 29.5 05/24/2024 1310   MCH 28.4 09/08/2023 0918   MCHC 32.2 05/24/2024 1310   MCHC 31.7 09/08/2023 0918   RDW 13.0 05/24/2024 1310   LYMPHSABS 1.8  05/24/2024 1310   MONOABS 0.5 06/21/2023 1308   EOSABS 0.1 05/24/2024 1310   BASOSABS 0.0 05/24/2024 1310    Lab Results  Component Value Date   HGBA1C 5.8 02/06/2009       Assessment & Plan Essential hypertension Blood pressure controlled in the clinic but at home is elevated, influenced by coffee and smoking. Current medications: amlodipine , lisinopril -hydrochlorothiazide . Occasionally doubles dose when BP is high. - Continue amlodipine  10 mg daily. - Continue lisinopril -hydrochlorothiazide  20-12.5 mg daily. - Advised checking BP without coffee or smoking for accurate readings. - Consider doubling medication dose if BP remains high without coffee or  smoking.  Gastroesophageal reflux disease Reflux at 4:30 AM, worsened by alcohol. Prefers not to use pantoprazole  due to side effects. Symptoms improve with alcohol avoidance. - Prescribed Nexium - Ordered H. pylori test -patient began the test and then left because she states she could not wait to completed -Advised to avoid recumbency up to 2 hours postmeal, avoid late meals, avoid foods that trigger symptoms. - Referred to gastroenterologist, might need EGD  Nicotine dependence, cigarettes Smokes one pack per day for 25-26 years. Smoking cessation may improve reflux. - Discussed benefits of smoking cessation on reflux. -Smoking cessation support: smoking cessation hotline: 1-800-QUIT-NOW.  Smoking cessation classes are available through [facility]. Call [phone] or visit our website at [email or web address].  Spent 3 minutes counseling on dangers of tobacco use and benefits of quitting, offered pharmacological intervention to aid quitting and patient is not ready to quit.   Alcohol use disorder Alcohol worsens reflux. Symptoms improve with avoidance. - Encouraged reduction and cessation of alcohol.    Prediabetes A1c was 5.8 in 2010 Advised to work on lifestyle modification to prevent progression to type 2 diabetes mellitus Will send off A1c today.   General health maintenance HIV testing requested. Screening for viral disease- Ordered HIV test.       Meds ordered this encounter  Medications   amLODipine  (NORVASC ) 10 MG tablet    Sig: TAKE 1 TABLET (10 MG TOTAL) BY MOUTH DAILY (Office visit for future refills)    Dispense:  30 tablet    Refill:  1   atorvastatin  (LIPITOR) 10 MG tablet    Sig: Take 1 tablet (10 mg total) by mouth daily.    Dispense:  90 tablet    Refill:  1   lisinopril -hydrochlorothiazide  (ZESTORETIC ) 20-12.5 MG tablet    Sig: Take 2 tablets by mouth daily.    Dispense:  30 tablet    Refill:  1   esomeprazole  (NEXIUM) 40 MG capsule    Sig: Take 1 capsule (40 mg total) by mouth daily.    Dispense:  30 capsule    Refill:  3   metoprolol  succinate (TOPROL -XL) 50 MG 24 hr tablet    Sig: Take 1 tablet (50 mg total) by mouth daily.    Dispense:  90 tablet    Refill:  1    Follow-up: Return in about 1 month (around 08/14/2024) for CPE/ Preventive Health Exam.       Corrina Sabin, MD, FAAFP. [facility] [place], [place] [phone]   07/14/2024, 12:31 PM

## 2024-07-14 NOTE — Patient Instructions (Signed)
 VISIT SUMMARY:  Today, you came in for an HIV test and to discuss your blood pressure management. We also talked about your acid reflux, smoking, and alcohol use.  YOUR PLAN:  -ESSENTIAL HYPERTENSION: Essential hypertension means you have high blood pressure without a known cause. Your blood pressure is sometimes high, especially after drinking coffee. Continue taking amlodipine  10 mg daily and lisinopril -hydrochlorothiazide  20-12.5 mg daily. Check your blood pressure without drinking coffee or smoking for more accurate readings. If your blood pressure remains high without coffee or smoking, you may consider doubling your medication dose.  -GASTROESOPHAGEAL REFLUX DISEASE (GERD): GERD is a condition where stomach acid frequently flows back into the tube connecting your mouth and stomach, causing discomfort. Your symptoms are worse with alcohol. You are prescribed Nexium to take before 4 AM. We also ordered a test for H. pylori, a bacteria that can cause stomach problems, and referred you to a gastroenterologist for further evaluation.  -NICOTINE DEPENDENCE, CIGARETTES: Nicotine dependence means you are addicted to the nicotine in cigarettes. Smoking can worsen your acid reflux. We discussed the benefits of quitting smoking, which may help improve your reflux symptoms.  -ALCOHOL USE DISORDER: Alcohol use disorder means that your drinking is causing health problems. Alcohol worsens your acid reflux. We encouraged you to reduce or stop drinking alcohol to help with your symptoms.  -PREDIABETES: Prediabetes means your blood sugar levels are higher than normal but not high enough to be classified as diabetes. We screened you for diabetes to catch any early signs.  -GENERAL HEALTH MAINTENANCE: You requested an HIV test, which we have ordered. Your recent blood tests in October were normal.  INSTRUCTIONS:  Please follow up with the gastroenterologist as referred. Continue monitoring your blood pressure  at home, avoiding coffee and smoking before taking readings. If your blood pressure remains high, consider doubling your medication dose as discussed. Reduce or stop alcohol consumption to help with your reflux symptoms. We will contact you with the results of your HIV test and diabetes screening.

## 2024-07-18 ENCOUNTER — Ambulatory Visit: Payer: Self-pay | Admitting: Family Medicine

## 2024-07-18 LAB — HEMOGLOBIN A1C
Est. average glucose Bld gHb Est-mCnc: 117 mg/dL
Hgb A1c MFr Bld: 5.7 % — ABNORMAL HIGH (ref 4.8–5.6)

## 2024-07-18 LAB — HIV ANTIBODY (ROUTINE TESTING W REFLEX): HIV Screen 4th Generation wRfx: NONREACTIVE

## 2024-07-18 LAB — H. PYLORI BREATH COLLECTION

## 2024-07-18 LAB — H. PYLORI BREATH TEST

## 2024-07-19 ENCOUNTER — Other Ambulatory Visit: Payer: Self-pay

## 2024-08-12 ENCOUNTER — Emergency Department (HOSPITAL_COMMUNITY)

## 2024-08-12 ENCOUNTER — Other Ambulatory Visit: Payer: Self-pay

## 2024-08-12 ENCOUNTER — Observation Stay (HOSPITAL_COMMUNITY)
Admission: EM | Admit: 2024-08-12 | Discharge: 2024-08-13 | Disposition: A | Discharge status: Home / Self Care | Attending: Internal Medicine | Admitting: Internal Medicine

## 2024-08-12 ENCOUNTER — Encounter (HOSPITAL_COMMUNITY): Payer: Self-pay | Admitting: Internal Medicine

## 2024-08-12 DIAGNOSIS — K229 Disease of esophagus, unspecified: Secondary | ICD-10-CM | POA: Insufficient documentation

## 2024-08-12 DIAGNOSIS — Z79899 Other long term (current) drug therapy: Secondary | ICD-10-CM | POA: Diagnosis not present

## 2024-08-12 DIAGNOSIS — N179 Acute kidney failure, unspecified: Secondary | ICD-10-CM | POA: Diagnosis not present

## 2024-08-12 DIAGNOSIS — K250 Acute gastric ulcer with hemorrhage: Principal | ICD-10-CM | POA: Insufficient documentation

## 2024-08-12 DIAGNOSIS — I10 Essential (primary) hypertension: Secondary | ICD-10-CM | POA: Diagnosis not present

## 2024-08-12 DIAGNOSIS — F1290 Cannabis use, unspecified, uncomplicated: Secondary | ICD-10-CM | POA: Insufficient documentation

## 2024-08-12 DIAGNOSIS — K2970 Gastritis, unspecified, without bleeding: Secondary | ICD-10-CM | POA: Diagnosis not present

## 2024-08-12 DIAGNOSIS — F17210 Nicotine dependence, cigarettes, uncomplicated: Secondary | ICD-10-CM | POA: Diagnosis not present

## 2024-08-12 DIAGNOSIS — K3189 Other diseases of stomach and duodenum: Secondary | ICD-10-CM | POA: Diagnosis not present

## 2024-08-12 DIAGNOSIS — K921 Melena: Secondary | ICD-10-CM | POA: Diagnosis not present

## 2024-08-12 DIAGNOSIS — R109 Unspecified abdominal pain: Secondary | ICD-10-CM | POA: Diagnosis present

## 2024-08-12 DIAGNOSIS — K259 Gastric ulcer, unspecified as acute or chronic, without hemorrhage or perforation: Secondary | ICD-10-CM

## 2024-08-12 DIAGNOSIS — B3781 Candidal esophagitis: Secondary | ICD-10-CM | POA: Diagnosis not present

## 2024-08-12 DIAGNOSIS — K922 Gastrointestinal hemorrhage, unspecified: Secondary | ICD-10-CM | POA: Diagnosis not present

## 2024-08-12 DIAGNOSIS — R933 Abnormal findings on diagnostic imaging of other parts of digestive tract: Secondary | ICD-10-CM | POA: Insufficient documentation

## 2024-08-12 DIAGNOSIS — E782 Mixed hyperlipidemia: Secondary | ICD-10-CM

## 2024-08-12 DIAGNOSIS — K2901 Acute gastritis with bleeding: Principal | ICD-10-CM

## 2024-08-12 LAB — CBC
HCT: 47.4 % — ABNORMAL HIGH (ref 36.0–46.0)
Hemoglobin: 15.7 g/dL — ABNORMAL HIGH (ref 12.0–15.0)
MCH: 29.5 pg (ref 26.0–34.0)
MCHC: 33.1 g/dL (ref 30.0–36.0)
MCV: 89.1 fL (ref 80.0–100.0)
Platelets: 246 K/uL (ref 150–400)
RBC: 5.32 MIL/uL — ABNORMAL HIGH (ref 3.87–5.11)
RDW: 14.6 % (ref 11.5–15.5)
WBC: 7.9 K/uL (ref 4.0–10.5)
nRBC: 0 % (ref 0.0–0.2)

## 2024-08-12 LAB — URINALYSIS, ROUTINE W REFLEX MICROSCOPIC
Bilirubin Urine: NEGATIVE
Glucose, UA: NEGATIVE mg/dL
Hgb urine dipstick: NEGATIVE
Ketones, ur: NEGATIVE mg/dL
Leukocytes,Ua: NEGATIVE
Nitrite: NEGATIVE
Protein, ur: 100 mg/dL — AB
Specific Gravity, Urine: >1.046 — ABNORMAL HIGH (ref 1.005–1.030)
pH: 5 (ref 5.0–8.0)

## 2024-08-12 LAB — COMPREHENSIVE METABOLIC PANEL WITH GFR
ALT: 17 U/L (ref 0–44)
AST: 21 U/L (ref 15–41)
Albumin: 4.3 g/dL (ref 3.5–5.0)
Alkaline Phosphatase: 63 U/L (ref 38–126)
Anion gap: 12 (ref 5–15)
BUN: 29 mg/dL — ABNORMAL HIGH (ref 8–23)
CO2: 24 mmol/L (ref 22–32)
Calcium: 10.7 mg/dL — ABNORMAL HIGH (ref 8.9–10.3)
Chloride: 104 mmol/L (ref 98–111)
Creatinine, Ser: 1.19 mg/dL — ABNORMAL HIGH (ref 0.44–1.00)
GFR, Estimated: 52 mL/min — ABNORMAL LOW
Glucose, Bld: 116 mg/dL — ABNORMAL HIGH (ref 70–99)
Potassium: 3.7 mmol/L (ref 3.5–5.1)
Sodium: 140 mmol/L (ref 135–145)
Total Bilirubin: 0.6 mg/dL (ref 0.0–1.2)
Total Protein: 7.8 g/dL (ref 6.5–8.1)

## 2024-08-12 LAB — CBG MONITORING, ED: Glucose-Capillary: 110 mg/dL — ABNORMAL HIGH (ref 70–99)

## 2024-08-12 LAB — HEMOGLOBIN AND HEMATOCRIT, BLOOD
HCT: 43.1 % (ref 36.0–46.0)
Hemoglobin: 14.1 g/dL (ref 12.0–15.0)

## 2024-08-12 LAB — LIPASE, BLOOD: Lipase: 26 U/L (ref 11–51)

## 2024-08-12 LAB — POC OCCULT BLOOD, ED: Fecal Occult Bld: POSITIVE — AB

## 2024-08-12 MED ORDER — PANTOPRAZOLE SODIUM 40 MG IV SOLR
40.0000 mg | Freq: Two times a day (BID) | INTRAVENOUS | Status: DC
  Administered 2024-08-12: 40 mg via INTRAVENOUS
  Filled 2024-08-12: qty 10

## 2024-08-12 MED ORDER — AMLODIPINE BESYLATE 10 MG PO TABS: 10.0000 mg | ORAL_TABLET | Freq: Every day | ORAL | Status: DC

## 2024-08-12 MED ORDER — MORPHINE SULFATE (PF) 2 MG/ML IV SOLN
2.0000 mg | Freq: Once | INTRAVENOUS | Status: AC
  Administered 2024-08-12: 2 mg via INTRAVENOUS
  Filled 2024-08-12: qty 1

## 2024-08-12 MED ORDER — ALUM & MAG HYDROXIDE-SIMETH 200-200-20 MG/5ML PO SUSP
30.0000 mL | Freq: Once | ORAL | Status: AC
  Administered 2024-08-12: 30 mL via ORAL
  Filled 2024-08-12: qty 30

## 2024-08-12 MED ORDER — ATORVASTATIN CALCIUM 10 MG PO TABS
10.0000 mg | ORAL_TABLET | Freq: Every day | ORAL | Status: DC
  Administered 2024-08-12: 10 mg via ORAL
  Filled 2024-08-12: qty 1

## 2024-08-12 MED ORDER — TRAZODONE HCL 50 MG PO TABS
25.0000 mg | ORAL_TABLET | Freq: Every evening | ORAL | Status: DC | PRN
  Administered 2024-08-12: 25 mg via ORAL
  Filled 2024-08-12: qty 1

## 2024-08-12 MED ORDER — ACETAMINOPHEN 650 MG RE SUPP: 650.0000 mg | Freq: Four times a day (QID) | RECTAL | Status: DC | PRN

## 2024-08-12 MED ORDER — HYDROCHLOROTHIAZIDE 25 MG PO TABS: 25.0000 mg | ORAL_TABLET | Freq: Every day | ORAL | Status: DC

## 2024-08-12 MED ORDER — MORPHINE SULFATE (PF) 2 MG/ML IV SOLN
1.0000 mg | INTRAVENOUS | Status: DC | PRN
  Administered 2024-08-12: 1 mg via INTRAVENOUS
  Filled 2024-08-12: qty 1

## 2024-08-12 MED ORDER — ALBUTEROL SULFATE (2.5 MG/3ML) 0.083% IN NEBU: 2.5000 mg | INHALATION_SOLUTION | RESPIRATORY_TRACT | Status: DC | PRN

## 2024-08-12 MED ORDER — ALUM & MAG HYDROXIDE-SIMETH 200-200-20 MG/5ML PO SUSP: 30.0000 mL | ORAL | Status: DC | PRN

## 2024-08-12 MED ORDER — ONDANSETRON HCL 4 MG/2ML IJ SOLN: 4.0000 mg | Freq: Four times a day (QID) | INTRAMUSCULAR | Status: DC | PRN

## 2024-08-12 MED ORDER — LISINOPRIL 20 MG PO TABS: 40.0000 mg | ORAL_TABLET | Freq: Every day | ORAL | Status: DC

## 2024-08-12 MED ORDER — MORPHINE SULFATE (PF) 2 MG/ML IV SOLN
1.0000 mg | INTRAVENOUS | Status: DC | PRN
  Administered 2024-08-12: 1 mg via INTRAVENOUS
  Filled 2024-08-12 (×2): qty 1

## 2024-08-12 MED ORDER — LISINOPRIL-HYDROCHLOROTHIAZIDE 20-12.5 MG PO TABS: 2.0000 | ORAL_TABLET | Freq: Every day | ORAL | Status: DC

## 2024-08-12 MED ORDER — IOHEXOL 300 MG/ML  SOLN
100.0000 mL | Freq: Once | INTRAMUSCULAR | Status: AC | PRN
  Administered 2024-08-12: 100 mL via INTRAVENOUS

## 2024-08-12 MED ORDER — OXYCODONE HCL 5 MG PO TABS
5.0000 mg | ORAL_TABLET | ORAL | Status: DC | PRN
  Administered 2024-08-12 (×2): 5 mg via ORAL
  Filled 2024-08-12 (×2): qty 1

## 2024-08-12 MED ORDER — PANTOPRAZOLE SODIUM 40 MG IV SOLR
40.0000 mg | Freq: Once | INTRAVENOUS | Status: AC
  Administered 2024-08-12: 40 mg via INTRAVENOUS
  Filled 2024-08-12: qty 10

## 2024-08-12 MED ORDER — ONDANSETRON HCL 4 MG PO TABS: 4.0000 mg | ORAL_TABLET | Freq: Four times a day (QID) | ORAL | Status: DC | PRN

## 2024-08-12 MED ORDER — METOPROLOL SUCCINATE ER 50 MG PO TB24: 50.0000 mg | ORAL_TABLET | Freq: Every day | ORAL | Status: DC

## 2024-08-12 MED ORDER — ACETAMINOPHEN 325 MG PO TABS
650.0000 mg | ORAL_TABLET | Freq: Four times a day (QID) | ORAL | Status: DC | PRN
  Administered 2024-08-12: 650 mg via ORAL
  Filled 2024-08-12: qty 2

## 2024-08-12 NOTE — Plan of Care (Signed)
" °  Problem: Education: Goal: Knowledge of General Education information will improve Description: Including pain rating scale, medication(s)/side effects and non-pharmacologic comfort measures 08/12/2024 2132 by Debora Ou, RN Outcome: Not Progressing 08/12/2024 2132 by Debora Ou, RN Outcome: Progressing   Problem: Health Behavior/Discharge Planning: Goal: Ability to manage health-related needs will improve 08/12/2024 2132 by Debora Ou, RN Outcome: Not Progressing 08/12/2024 2132 by Debora Ou, RN Outcome: Progressing   "

## 2024-08-12 NOTE — ED Provider Notes (Signed)
 " Jennifer [name]   CSN: [id number] Arrival date & time: 08/12/24  9251     Patient presents with: Nausea and Abdominal Pain   Jennifer [name] is a 64 y.o. female.   This is a 64 year old female presenting emergency department for abdominal pain.  Reports she noticed some minor symptoms roughly a week ago with generalized pain in her epigastrium and right upper quadrant. Resolved until 2 days ago when returned.  Points to epigastrium and right upper quadrant around to her right flank, but states sometimes it feels like it is under her left breast.  No fevers or chills.  She has been nauseated and vomiting.  Reports she cannot keep anything down.  Had some dark black colored stools overnight.  No fevers or chills.  Reports prior fibroid removal and hysterectomy, but no other abdominal surgeries. Not on blood thinner.    Abdominal Pain      Prior to Admission medications  Medication Sig Start Date End Date Taking? Authorizing Provider  amLODipine  (NORVASC ) 10 MG tablet TAKE 1 TABLET (10 MG TOTAL) BY MOUTH DAILY (Office visit for future refills) 07/14/24   Newlin, Enobong, MD  atorvastatin  (LIPITOR) 10 MG tablet Take 1 tablet (10 mg total) by mouth daily. 07/14/24   Newlin, Enobong, MD  Blood Pressure Monitoring (BLOOD PRESSURE CUFF) MISC 1 Units by Does not apply route daily. Patient not taking: Reported on 07/14/2024 05/24/24   Mayers, Cari S, PA-C  esomeprazole  (NEXIUM ) 40 MG capsule Take 1 capsule (40 mg total) by mouth daily. 07/14/24   Newlin, Enobong, MD  lisinopril -hydrochlorothiazide  (ZESTORETIC ) 20-12.5 MG tablet Take 2 tablets by mouth daily. 07/14/24   Newlin, Enobong, MD  metoprolol  succinate (TOPROL -XL) 50 MG 24 hr tablet Take 1 tablet (50 mg total) by mouth daily. 07/14/24   Newlin, Enobong, MD    Allergies: Patient has no known allergies.    Review of Systems  Gastrointestinal:  Positive for abdominal pain.     Updated Vital Signs BP 125/84 (BP Location: Right Arm)   Pulse 75   Temp 98.2 F (36.8 C)   Resp 16   Ht 5' 4 (1.626 m)   Wt 81.6 kg   LMP 12/04/2009   SpO2 100%   BMI 30.88 kg/m   Physical Exam Vitals and nursing [name] reviewed.  Constitutional:      General: She is not in acute distress.    Appearance: She is not toxic-appearing.  HENT:     Head: Normocephalic.  Cardiovascular:     Rate and Rhythm: Normal rate.  Pulmonary:     Effort: Pulmonary effort is normal.     Breath sounds: Normal breath sounds.  Abdominal:     General: Abdomen is flat.     Palpations: Abdomen is soft.     Tenderness: There is abdominal tenderness in the right upper quadrant and epigastric area.  Neurological:     General: No focal deficit present.     Mental Status: She is alert.  Psychiatric:        Mood and Affect: Mood normal.     (all labs ordered are listed, but only abnormal results are displayed) Labs Reviewed  COMPREHENSIVE METABOLIC PANEL WITH GFR - Abnormal; Notable for the following components:      Result Value   Glucose, Bld 116 (*)    BUN 29 (*)    Creatinine, Ser 1.19 (*)    Calcium  10.7 (*)    GFR,  Estimated 52 (*)    All other components within normal limits  CBC - Abnormal; Notable for the following components:   RBC 5.32 (*)    Hemoglobin 15.7 (*)    HCT 47.4 (*)    All other components within normal limits  URINALYSIS, ROUTINE W REFLEX MICROSCOPIC - Abnormal; Notable for the following components:   APPearance CLOUDY (*)    Specific Gravity, Urine >1.046 (*)    Protein, ur 100 (*)    Bacteria, UA RARE (*)    All other components within normal limits  CBG MONITORING, ED - Abnormal; Notable for the following components:   Glucose-Capillary 110 (*)    All other components within normal limits  POC OCCULT BLOOD, ED - Abnormal; Notable for the following components:   Fecal Occult Bld POSITIVE (*)    All other components within normal limits  LIPASE, BLOOD     EKG: None  Radiology: US  Abdomen Limited RUQ (LIVER/GB) Result Date: 08/12/2024 CLINICAL DATA:  Right upper quadrant abdominal pain EXAM: ULTRASOUND ABDOMEN LIMITED RIGHT UPPER QUADRANT COMPARISON:  June 12, 2016 FINDINGS: Gallbladder: No gallstones or wall thickening visualized. No sonographic Murphy sign noted by sonographer. Common bile duct: Diameter: 2 mm which is within normal limits. Liver: No focal lesion identified. Within normal limits in parenchymal echogenicity. Portal vein is patent on color Doppler imaging with normal direction of blood flow towards the liver. Other: None. IMPRESSION: No definite abnormality seen in the right upper quadrant of the abdomen. Electronically Signed   By: Lynwood Landy Raddle M.D.   On: 08/12/2024 10:27   CT ABDOMEN PELVIS W CONTRAST Result Date: 08/12/2024 CLINICAL DATA:  Acute generalized abdominal pain EXAM: CT ABDOMEN AND PELVIS WITH CONTRAST TECHNIQUE: Multidetector CT imaging of the abdomen and pelvis was performed using the standard protocol following bolus administration of intravenous contrast. RADIATION DOSE REDUCTION: This exam was performed according to the departmental dose-optimization program which includes automated exposure control, adjustment of the mA and/or kV according to patient size and/or use of iterative reconstruction technique. CONTRAST:  OMNIPAQUE  IOHEXOL  300 MG/ML  SOLN COMPARISON:  February 15, 2018 FINDINGS: Lower chest: Visualized lung bases are unremarkable. Moderately dilated esophagus is noted with moderate wall thickening suggesting esophagitis. Hepatobiliary: No focal liver abnormality is seen. No gallstones, gallbladder wall thickening, or biliary dilatation. Pancreas: Unremarkable. No pancreatic ductal dilatation or surrounding inflammatory changes. Spleen: Normal in size without focal abnormality. Adrenals/Urinary Tract: Adrenal glands are unremarkable. Kidneys are normal, without renal calculi, focal lesion, or  hydronephrosis. Bladder is unremarkable. Stomach/Bowel: Moderate wall thickening of distal stomach is noted concerning for gastritis or peptic ulcer disease. Possible ulceration is noted along lesser curvature. Endoscopy is recommended. There is no evidence of bowel obstruction. Appendix is unremarkable. Vascular/Lymphatic: Aortic atherosclerosis. No enlarged abdominal or pelvic lymph nodes. Reproductive: Status post hysterectomy. No adnexal masses. Other: No abdominal wall hernia or abnormality. No abdominopelvic ascites. Musculoskeletal: No acute or significant osseous findings. IMPRESSION: 1. Moderate wall thickening of distal stomach is noted concerning for gastritis or peptic ulcer disease. Possible ulceration is noted along lesser curvature. Endoscopy is recommended. 2. Moderately dilated esophagus is noted with moderate wall thickening suggesting esophagitis. 3. Aortic atherosclerosis. Aortic Atherosclerosis (ICD10-I70.0). Electronically Signed   By: Lynwood Landy Raddle M.D.   On: 08/12/2024 10:02     Procedures   Medications Ordered in the ED  morphine  (PF) 2 MG/ML injection 2 mg (2 mg Intravenous Given 08/12/24 0903)  alum & mag hydroxide-simeth (MAALOX/MYLANTA) 200-200-20 MG/5ML  suspension 30 mL (30 mLs Oral Given 08/12/24 0904)  iohexol  (OMNIPAQUE ) 300 MG/ML solution 100 mL (100 mLs Intravenous Contrast Given 08/12/24 0939)  morphine  (PF) 2 MG/ML injection 2 mg (2 mg Intravenous Given 08/12/24 1008)  pantoprazole  (PROTONIX ) injection 40 mg (40 mg Intravenous Given 08/12/24 1052)    Clinical Course as of 08/12/24 1122  Fri Aug 12, 2024  1002 CT ABDOMEN PELVIS W CONTRAST I do not appreciate free air on my independent review of images [TY]  1002 US  Abdomen Limited RUQ (LIVER/GB) Gallbladder wall and CBD appear to be normal on my independent review of images [TY]  1015 CT ABDOMEN PELVIS W CONTRAST MPRESSION: 1. Moderate wall thickening of distal stomach is noted concerning for gastritis or peptic  ulcer disease. Possible ulceration is noted along lesser curvature. Endoscopy is recommended. 2. Moderately dilated esophagus is noted with moderate wall thickening suggesting esophagitis. 3. Aortic atherosclerosis.  Aortic Atherosclerosis (ICD10-I70.0).   Electronically Signed   By: Lynwood Landy Raddle M.D.   On: 08/12/2024 10:02   [TY]  1040 US  Abdomen Limited RUQ (LIVER/GB) IMPRESSION: No definite abnormality seen in the right upper quadrant of the abdomen.   [TY]  1054 Fecal Occult Blood, POC(!): POSITIVE [TY]  1121 Given patient's CT scan with gastritis versus ulcer with several episodes of melanotic stool overnight, severe pain and occult positive stool.  Will admit for evaluation for upper GI bleed/ulcer.  Given Protonix .  Reached out to Dr. Kriss for GI involvement.  Will consult hospitalist for admission. [TY]    Clinical Course User Index [TY] Neysa Caron PARAS, DO                                 Medical Decision Making This is a 64 year old female with past medical history fibroids, hypertension, bipolar, polysubstance abuse presenting emergency department for abdominal pain.  She is afebrile nontachycardic, normotensive.  Overall clinically well-appearing, but does have some tenderness in her epigastrium and right upper quadrant, but soft abdomen.  Screening labs with no leukocytosis.  No significant electrolyte abnormalities despite her nausea and vomiting.  Minor elevation of BUN/creatinine.  No transaminitis or elevated bilirubin to suggest acute obstructive biliary process.  Lipase is normal.  Pancreatitis unlikely.  Patient does not to be in discomfort.  Morphine  ordered.  Will also get CT abdomen to evaluate for obstructive process.  Will get right upper quadrant ultrasound to evaluate for cholecystitis/biliary colic.  See ED course for further MDM final disposition.  Amount and/or Complexity of Data Reviewed External Data Reviewed:     Details: Appears had last CT  abdomen pelvis in 2019.  Had enteritis/ileus. Labs: ordered. Decision-making details documented in ED Course. Radiology: ordered. Decision-making details documented in ED Course. ECG/medicine tests: ordered.  Risk OTC drugs. Prescription drug management. Decision regarding hospitalization.      Final diagnoses:  Acute gastritis with hemorrhage, unspecified gastritis type    ED Discharge Orders     None          Neysa Caron PARAS, DO 08/12/24 1122  "

## 2024-08-12 NOTE — ED Triage Notes (Signed)
 Pt BIB EMS from home c/o  abd pain that moves from back to front, black stool,diarrhea plus N/V. Pt cites being pre diabetic   EMS vitals  BP 98 palpated  HR 62 SPO2 72 %; , after giving 15 L, returned to 99%

## 2024-08-12 NOTE — Consult Note (Signed)
 [facility] Gastroenterology Consult  Referring Provider: No ref. provider found Primary Care Physician:  Delbert Clam, MD Primary Gastroenterologist: [place]  Reason for Consultation: Melena, abdominal pain, abnormal CT imaging  SUBJECTIVE:   HPI: Jennifer [name] is a 64 y.o. female with past medical history significant for bipolar disorder, polysubstance use.  Presented to hospital with chief complaint of relatively new onset nausea, vomiting, melena, abdominal pain.  Has used Aleve  for many years.  Began experiencing melena roughly 2 days ago.  Was having some nausea and vomiting prior to that time.  Having loose stools.  No chest pain.  Having shortness of breath.  Hemoglobin 15.7, PLT 246, WBC 7.9, BUN/creatinine 29/1.19. CTAP 08/12/2024 showed moderate wall thickening of the distal stomach concerning for gastritis versus peptic ulcer disease, possible ulceration noted on the lesser curvature.  No anticoagulant use. No family history colon cancer. No prior EGD.  Past Medical History:  Diagnosis Date   bipolar    Bipolar 1 disorder (HCC)    Depression    Fibroids    now s/p hysterectomy   Hypertension    Left ventricular hypertrophy    Polysubstance abuse (HCC)    IV drug us , cocaine on occassion, smoking and alcoholism, has been going to AA, not completely sober   Past Surgical History:  Procedure Laterality Date   ABDOMINAL HYSTERECTOMY     2003   BILATERAL SALPINGOOPHORECTOMY     01/2010   DILATION AND CURETTAGE OF UTERUS     EXPLORATORY LAPAROTOMY     complex pelvic mass 2011   TONSILLECTOMY     Prior to Admission medications  Medication Sig Start Date End Date Taking? Authorizing Provider  amLODipine  (NORVASC ) 10 MG tablet TAKE 1 TABLET (10 MG TOTAL) BY MOUTH DAILY (Office visit for future refills) 07/14/24   Newlin, Enobong, MD  atorvastatin  (LIPITOR) 10 MG tablet Take 1 tablet (10 mg total) by mouth daily. 07/14/24   Newlin, Enobong, MD  Blood Pressure Monitoring  (BLOOD PRESSURE CUFF) MISC 1 Units by Does not apply route daily. Patient not taking: Reported on 07/14/2024 05/24/24   Mayers, Cari S, PA-C  esomeprazole  (NEXIUM ) 40 MG capsule Take 1 capsule (40 mg total) by mouth daily. 07/14/24   Newlin, Enobong, MD  lisinopril -hydrochlorothiazide  (ZESTORETIC ) 20-12.5 MG tablet Take 2 tablets by mouth daily. 07/14/24   Newlin, Enobong, MD  metoprolol  succinate (TOPROL -XL) 50 MG 24 hr tablet Take 1 tablet (50 mg total) by mouth daily. 07/14/24   Newlin, Enobong, MD   Current Facility-Administered Medications  Medication Dose Route Frequency Provider Last Rate Last Admin   cloNIDine  (CATAPRES ) tablet 0.1 mg  0.1 mg Oral BID    0.1 mg at 05/04/23 0955   pantoprazole  (PROTONIX ) injection 40 mg  40 mg Intravenous Q12H Kriss Estefana DEL, DO       Current Outpatient Medications  Medication Sig Dispense Refill   amLODipine  (NORVASC ) 10 MG tablet TAKE 1 TABLET (10 MG TOTAL) BY MOUTH DAILY (Office visit for future refills) 30 tablet 1   atorvastatin  (LIPITOR) 10 MG tablet Take 1 tablet (10 mg total) by mouth daily. 90 tablet 1   Blood Pressure Monitoring (BLOOD PRESSURE CUFF) MISC 1 Units by Does not apply route daily. (Patient not taking: Reported on 07/14/2024) 1 each 0   esomeprazole  (NEXIUM ) 40 MG capsule Take 1 capsule (40 mg total) by mouth daily. 30 capsule 3   lisinopril -hydrochlorothiazide  (ZESTORETIC ) 20-12.5 MG tablet Take 2 tablets by mouth daily. 30 tablet 1   metoprolol   succinate (TOPROL -XL) 50 MG 24 hr tablet Take 1 tablet (50 mg total) by mouth daily. 90 tablet 1   Allergies as of 08/12/2024   (No Known Allergies)   Family History  Problem Relation Age of Onset   Hypertension Mother    Hypertension Sister    Diabetes Father    Social History   Socioeconomic History   Marital status: Single    Spouse name: Not on file   Number of children: 0   Years of education: Not on file   Highest education level: Some college, no degree  Occupational  History   Not on file  Tobacco Use   Smoking status: Every Day    Current packs/day: 1.00    Average packs/day: 1 pack/day for 20.0 years (20.0 ttl pk-yrs)    Types: Cigarettes   Smokeless tobacco: Never   Tobacco comments:    Wants to quit.   Vaping Use   Vaping status: Never Used  Substance and Sexual Activity   Alcohol use: Yes    Comment: occ.   Drug use: Not Currently    Types: Cocaine, Marijuana   Sexual activity: Not Currently    Birth control/protection: Surgical  Other Topics Concern   Not on file  Social History Narrative   Financial assistance approved for 100% discount at [facility] and has [facility] card per Barnie Potters   07/19/2010         Social Drivers of Health   Tobacco Use: High Risk (07/14/2024)   Patient History    Smoking Tobacco Use: Every Day    Smokeless Tobacco Use: Never    Passive Exposure: Not on file  Financial Resource Strain: Not on file  Food Insecurity: Not on file  Transportation Needs: Not on file  Physical Activity: Not on file  Stress: Not on file  Social Connections: Not on file  Intimate Partner Violence: Not on file  Depression (PHQ2-9): Low Risk (05/24/2024)   Depression (PHQ2-9)    PHQ-2 Score: 1  Recent Concern: Depression (PHQ2-9) - Medium Risk (03/04/2024)   Depression (PHQ2-9)    PHQ-2 Score: 6  Alcohol Screen: Not on file  Housing: Not on file  Utilities: Not on file  Health Literacy: Not on file   Review of Systems:  Review of Systems  Respiratory:  Positive for shortness of breath.   Cardiovascular:  Negative for chest pain.  Gastrointestinal:  Positive for abdominal pain, diarrhea, melena, nausea and vomiting.    OBJECTIVE:   Temp:  [98.2 F (36.8 C)-98.9 F (37.2 C)] 98.9 F (37.2 C) (01/02 1125) Pulse Rate:  [70-75] 70 (01/02 1124) Resp:  [16-20] 20 (01/02 1124) BP: (125-140)/(84-96) 140/96 (01/02 1124) SpO2:  [100 %] 100 % (01/02 1124) Weight:  [81.6 kg] 81.6 kg (01/02 0805)   Physical Exam Constitutional:       General: She is not in acute distress.    Appearance: She is not ill-appearing, toxic-appearing or diaphoretic.  Cardiovascular:     Rate and Rhythm: Normal rate and regular rhythm.  Pulmonary:     Effort: No respiratory distress.     Breath sounds: Normal breath sounds.  Abdominal:     General: Bowel sounds are normal. There is no distension.     Palpations: Abdomen is soft.     Tenderness: There is abdominal tenderness.  Neurological:     Mental Status: She is alert.     Labs: Recent Labs    08/12/24 0814  WBC 7.9  HGB 15.7*  HCT 47.4*  PLT 246   BMET Recent Labs    08/12/24 0814  NA 140  K 3.7  CL 104  CO2 24  GLUCOSE 116*  BUN 29*  CREATININE 1.19*  CALCIUM  10.7*   LFT Recent Labs    08/12/24 0814  PROT 7.8  ALBUMIN 4.3  AST 21  ALT 17  ALKPHOS 63  BILITOT 0.6   PT/INR No results for input(s): LABPROT, INR in the last 72 hours.  Diagnostic imaging: US  Abdomen Limited RUQ (LIVER/GB) Result Date: 08/12/2024 CLINICAL DATA:  Right upper quadrant abdominal pain EXAM: ULTRASOUND ABDOMEN LIMITED RIGHT UPPER QUADRANT COMPARISON:  June 12, 2016 FINDINGS: Gallbladder: No gallstones or wall thickening visualized. No sonographic Murphy sign noted by sonographer. Common bile duct: Diameter: 2 mm which is within normal limits. Liver: No focal lesion identified. Within normal limits in parenchymal echogenicity. Portal vein is patent on color Doppler imaging with normal direction of blood flow towards the liver. Other: None. IMPRESSION: No definite abnormality seen in the right upper quadrant of the abdomen. Electronically Signed   By: Lynwood Landy Raddle M.D.   On: 08/12/2024 10:27   CT ABDOMEN PELVIS W CONTRAST Result Date: 08/12/2024 CLINICAL DATA:  Acute generalized abdominal pain EXAM: CT ABDOMEN AND PELVIS WITH CONTRAST TECHNIQUE: Multidetector CT imaging of the abdomen and pelvis was performed using the standard protocol following bolus administration of  intravenous contrast. RADIATION DOSE REDUCTION: This exam was performed according to the departmental dose-optimization program which includes automated exposure control, adjustment of the mA and/or kV according to patient size and/or use of iterative reconstruction technique. CONTRAST:  OMNIPAQUE  IOHEXOL  300 MG/ML  SOLN COMPARISON:  February 15, 2018 FINDINGS: Lower chest: Visualized lung bases are unremarkable. Moderately dilated esophagus is noted with moderate wall thickening suggesting esophagitis. Hepatobiliary: No focal liver abnormality is seen. No gallstones, gallbladder wall thickening, or biliary dilatation. Pancreas: Unremarkable. No pancreatic ductal dilatation or surrounding inflammatory changes. Spleen: Normal in size without focal abnormality. Adrenals/Urinary Tract: Adrenal glands are unremarkable. Kidneys are normal, without renal calculi, focal lesion, or hydronephrosis. Bladder is unremarkable. Stomach/Bowel: Moderate wall thickening of distal stomach is noted concerning for gastritis or peptic ulcer disease. Possible ulceration is noted along lesser curvature. Endoscopy is recommended. There is no evidence of bowel obstruction. Appendix is unremarkable. Vascular/Lymphatic: Aortic atherosclerosis. No enlarged abdominal or pelvic lymph nodes. Reproductive: Status post hysterectomy. No adnexal masses. Other: No abdominal wall hernia or abnormality. No abdominopelvic ascites. Musculoskeletal: No acute or significant osseous findings. IMPRESSION: 1. Moderate wall thickening of distal stomach is noted concerning for gastritis or peptic ulcer disease. Possible ulceration is noted along lesser curvature. Endoscopy is recommended. 2. Moderately dilated esophagus is noted with moderate wall thickening suggesting esophagitis. 3. Aortic atherosclerosis. Aortic Atherosclerosis (ICD10-I70.0). Electronically Signed   By: Lynwood Landy Raddle M.D.   On: 08/12/2024 10:02   IMPRESSION: Melena Abnormal CT  imaging Epigastric abdominal pain NSAID use Polysubstance use  PLAN: - Recommend EGD 08/13/2024 for further evaluation of melena and abnormal CT imaging, discussed procedure with patient including risks of bleeding/infection/perforation/missed lesion/anesthesia, she verbalized understanding and elected to proceed - IV PPI every 12 hours - Okay for diet today, n.p.o. at midnight for EGD tomorrow with Dr. Dianna - Further recommendations to follow pending procedure   LOS: 0 days   [name], [facility] Gastroenterology

## 2024-08-12 NOTE — Plan of Care (Deleted)
 SABRA

## 2024-08-12 NOTE — H&P (Signed)
 " History and Physical  Jennifer [name] FMW:[id number] DOB: [birth year] DOA: 08/12/2024  PCP: Delbert Clam, MD   Chief Complaint: Abdominal pain, melena, emesis  HPI: Jennifer [name] is a 64 y.o. female with medical history significant for bipolar 1 disorder, hypertension, polysubstance abuse, ongoing tobacco abuse being admitted to the hospital with abdominal pain and vomiting concerning for upper GI bleed and possible gastric ulceration.  Patient states she has never had endoscopy or GI bleeding before, she has used Aleve  for aches and pains for several years.  She does have reflux symptoms, and her PCP in December did recommend possible GI evaluation and endoscopy.  There was also plan to check for H. pylori, however patient did not complete testing.  In any case, patient was in her usual state of health until a couple days ago when she started having nausea, severe abdominal pain, vomiting and emesis both of which looked very dark.  No frank bleeding.  She is not on any blood thinners.  Review of Systems: Please see HPI for pertinent positives and negatives. A complete 10 system review of systems are otherwise negative.  Past Medical History:  Diagnosis Date   bipolar    Bipolar 1 disorder (HCC)    Depression    Fibroids    now s/p hysterectomy   Hypertension    Left ventricular hypertrophy    Polysubstance abuse (HCC)    IV drug us , cocaine on occassion, smoking and alcoholism, has been going to AA, not completely sober   Past Surgical History:  Procedure Laterality Date   ABDOMINAL HYSTERECTOMY     2003   BILATERAL SALPINGOOPHORECTOMY     01/2010   DILATION AND CURETTAGE OF UTERUS     EXPLORATORY LAPAROTOMY     complex pelvic mass 2011   TONSILLECTOMY     Social History:  reports that she has been smoking cigarettes. She has a 20 pack-year smoking history. She has never used smokeless tobacco. She reports current alcohol use. She reports that she does not currently use drugs  after having used the following drugs: Cocaine and Marijuana.  Allergies[1]  Family History  Problem Relation Age of Onset   Hypertension Mother    Hypertension Sister    Diabetes Father      Prior to Admission medications  Medication Sig Start Date End Date Taking? Authorizing Provider  amLODipine  (NORVASC ) 10 MG tablet TAKE 1 TABLET (10 MG TOTAL) BY MOUTH DAILY (Office visit for future refills) 07/14/24   Newlin, Enobong, MD  atorvastatin  (LIPITOR) 10 MG tablet Take 1 tablet (10 mg total) by mouth daily. 07/14/24   Newlin, Enobong, MD  Blood Pressure Monitoring (BLOOD PRESSURE CUFF) MISC 1 Units by Does not apply route daily. Patient not taking: Reported on 07/14/2024 05/24/24   Mayers, Kirk RAMAN, PA-C  esomeprazole  (NEXIUM ) 40 MG capsule Take 1 capsule (40 mg total) by mouth daily. 07/14/24   Newlin, Enobong, MD  lisinopril -hydrochlorothiazide  (ZESTORETIC ) 20-12.5 MG tablet Take 2 tablets by mouth daily. 07/14/24   Newlin, Enobong, MD  metoprolol  succinate (TOPROL -XL) 50 MG 24 hr tablet Take 1 tablet (50 mg total) by mouth daily. 07/14/24   Delbert Clam, MD    Physical Exam: BP (!) 140/96 (BP Location: Left Arm)   Pulse 70   Temp 98.9 F (37.2 C) (Oral)   Resp 20   Ht 5' 4 (1.626 m)   Wt 81.6 kg   LMP 12/04/2009   SpO2 100%   BMI 30.88 kg/m  General:  Alert, oriented, calm, in no acute distress  Eyes: EOMI, clear conjuctivae, white sclerea Neck: supple, no masses, trachea mildline  Cardiovascular: RRR, no murmurs or rubs, no peripheral edema  Respiratory: clear to auscultation bilaterally, no wheezes, no crackles  Abdomen: soft, diffusely tender with voluntary guarding, no rebound tenderness, nondistended, normal bowel tones heard  Skin: dry, no rashes  Musculoskeletal: no joint effusions, normal range of motion  Psychiatric: appropriate affect, normal speech  Neurologic: extraocular muscles intact, clear speech, moving all extremities with intact sensorium         Labs on  Admission:  Basic Metabolic Panel: Recent Labs  Lab 08/12/24 0814  NA 140  K 3.7  CL 104  CO2 24  GLUCOSE 116*  BUN 29*  CREATININE 1.19*  CALCIUM  10.7*   Liver Function Tests: Recent Labs  Lab 08/12/24 0814  AST 21  ALT 17  ALKPHOS 63  BILITOT 0.6  PROT 7.8  ALBUMIN 4.3   Recent Labs  Lab 08/12/24 0814  LIPASE 26   No results for input(s): AMMONIA in the last 168 hours. CBC: Recent Labs  Lab 08/12/24 0814  WBC 7.9  HGB 15.7*  HCT 47.4*  MCV 89.1  PLT 246   Cardiac Enzymes: No results for input(s): CKTOTAL, CKMB, CKMBINDEX, TROPONINI in the last 168 hours. BNP (last 3 results) No results for input(s): BNP in the last 8760 hours.  ProBNP (last 3 results) No results for input(s): PROBNP in the last 8760 hours.  CBG: Recent Labs  Lab 08/12/24 0808  GLUCAP 110*    Radiological Exams on Admission: US  Abdomen Limited RUQ (LIVER/GB) Result Date: 08/12/2024 CLINICAL DATA:  Right upper quadrant abdominal pain EXAM: ULTRASOUND ABDOMEN LIMITED RIGHT UPPER QUADRANT COMPARISON:  June 12, 2016 FINDINGS: Gallbladder: No gallstones or wall thickening visualized. No sonographic Murphy sign noted by sonographer. Common bile duct: Diameter: 2 mm which is within normal limits. Liver: No focal lesion identified. Within normal limits in parenchymal echogenicity. Portal vein is patent on color Doppler imaging with normal direction of blood flow towards the liver. Other: None. IMPRESSION: No definite abnormality seen in the right upper quadrant of the abdomen. Electronically Signed   By: Lynwood Landy Raddle M.D.   On: 08/12/2024 10:27   CT ABDOMEN PELVIS W CONTRAST Result Date: 08/12/2024 CLINICAL DATA:  Acute generalized abdominal pain EXAM: CT ABDOMEN AND PELVIS WITH CONTRAST TECHNIQUE: Multidetector CT imaging of the abdomen and pelvis was performed using the standard protocol following bolus administration of intravenous contrast. RADIATION DOSE REDUCTION: This  exam was performed according to the departmental dose-optimization program which includes automated exposure control, adjustment of the mA and/or kV according to patient size and/or use of iterative reconstruction technique. CONTRAST:  OMNIPAQUE  IOHEXOL  300 MG/ML  SOLN COMPARISON:  February 15, 2018 FINDINGS: Lower chest: Visualized lung bases are unremarkable. Moderately dilated esophagus is noted with moderate wall thickening suggesting esophagitis. Hepatobiliary: No focal liver abnormality is seen. No gallstones, gallbladder wall thickening, or biliary dilatation. Pancreas: Unremarkable. No pancreatic ductal dilatation or surrounding inflammatory changes. Spleen: Normal in size without focal abnormality. Adrenals/Urinary Tract: Adrenal glands are unremarkable. Kidneys are normal, without renal calculi, focal lesion, or hydronephrosis. Bladder is unremarkable. Stomach/Bowel: Moderate wall thickening of distal stomach is noted concerning for gastritis or peptic ulcer disease. Possible ulceration is noted along lesser curvature. Endoscopy is recommended. There is no evidence of bowel obstruction. Appendix is unremarkable. Vascular/Lymphatic: Aortic atherosclerosis. No enlarged abdominal or pelvic lymph nodes. Reproductive: Status post hysterectomy. No  adnexal masses. Other: No abdominal wall hernia or abnormality. No abdominopelvic ascites. Musculoskeletal: No acute or significant osseous findings. IMPRESSION: 1. Moderate wall thickening of distal stomach is noted concerning for gastritis or peptic ulcer disease. Possible ulceration is noted along lesser curvature. Endoscopy is recommended. 2. Moderately dilated esophagus is noted with moderate wall thickening suggesting esophagitis. 3. Aortic atherosclerosis. Aortic Atherosclerosis (ICD10-I70.0). Electronically Signed   By: Lynwood Landy Raddle M.D.   On: 08/12/2024 10:02   Assessment/Plan Jennifer [name] is a 64 y.o. female with medical history significant for  bipolar 1 disorder, hypertension, polysubstance abuse, ongoing tobacco abuse being admitted to the hospital with abdominal pain and vomiting concerning for upper GI bleed and possible gastric ulceration.   Upper GI bleed, gastric ulceration-concerning due to melena, abdominal pain, vomiting of dark material. -Observation admission -Monitor closely on progressive -Avoid blood thinners -Regular diet today, n.p.o. after midnight -IV PPI every 12 hours -GI consult appreciated, they plan endoscopy 1/3  Hypertension-continue home amlodipine , Toprol -XL, lisinopril /hydrochlorothiazide   Tobacco abuse-patient will need counseling to quit  DVT prophylaxis: SCDs only    Code Status: Full Code  Consults called: [facility]  Admission status: Observation  Time spent: 42 minutes  Nyair Depaulo CHRISTELLA Gail MD [facility] Pager [phone]  If 7PM-7AM, please contact night-coverage [email or web address] Password [facility]  08/12/2024, 12:29 PM      [1] No Known Allergies  "

## 2024-08-12 NOTE — H&P (View-Only) (Signed)
 [facility] Gastroenterology Consult  Referring Provider: No ref. provider found Primary Care Physician:  Delbert Clam, MD Primary Gastroenterologist: [place]  Reason for Consultation: Melena, abdominal pain, abnormal CT imaging  SUBJECTIVE:   HPI: Jennifer [name] is a 64 y.o. female with past medical history significant for bipolar disorder, polysubstance use.  Presented to hospital with chief complaint of relatively new onset nausea, vomiting, melena, abdominal pain.  Has used Aleve  for many years.  Began experiencing melena roughly 2 days ago.  Was having some nausea and vomiting prior to that time.  Having loose stools.  No chest pain.  Having shortness of breath.  Hemoglobin 15.7, PLT 246, WBC 7.9, BUN/creatinine 29/1.19. CTAP 08/12/2024 showed moderate wall thickening of the distal stomach concerning for gastritis versus peptic ulcer disease, possible ulceration noted on the lesser curvature.  No anticoagulant use. No family history colon cancer. No prior EGD.  Past Medical History:  Diagnosis Date   bipolar    Bipolar 1 disorder (HCC)    Depression    Fibroids    now s/p hysterectomy   Hypertension    Left ventricular hypertrophy    Polysubstance abuse (HCC)    IV drug us , cocaine on occassion, smoking and alcoholism, has been going to AA, not completely sober   Past Surgical History:  Procedure Laterality Date   ABDOMINAL HYSTERECTOMY     2003   BILATERAL SALPINGOOPHORECTOMY     01/2010   DILATION AND CURETTAGE OF UTERUS     EXPLORATORY LAPAROTOMY     complex pelvic mass 2011   TONSILLECTOMY     Prior to Admission medications  Medication Sig Start Date End Date Taking? Authorizing Provider  amLODipine  (NORVASC ) 10 MG tablet TAKE 1 TABLET (10 MG TOTAL) BY MOUTH DAILY (Office visit for future refills) 07/14/24   Newlin, Enobong, MD  atorvastatin  (LIPITOR) 10 MG tablet Take 1 tablet (10 mg total) by mouth daily. 07/14/24   Newlin, Enobong, MD  Blood Pressure Monitoring  (BLOOD PRESSURE CUFF) MISC 1 Units by Does not apply route daily. Patient not taking: Reported on 07/14/2024 05/24/24   Mayers, Cari S, PA-C  esomeprazole  (NEXIUM ) 40 MG capsule Take 1 capsule (40 mg total) by mouth daily. 07/14/24   Newlin, Enobong, MD  lisinopril -hydrochlorothiazide  (ZESTORETIC ) 20-12.5 MG tablet Take 2 tablets by mouth daily. 07/14/24   Newlin, Enobong, MD  metoprolol  succinate (TOPROL -XL) 50 MG 24 hr tablet Take 1 tablet (50 mg total) by mouth daily. 07/14/24   Newlin, Enobong, MD   Current Facility-Administered Medications  Medication Dose Route Frequency Provider Last Rate Last Admin   cloNIDine  (CATAPRES ) tablet 0.1 mg  0.1 mg Oral BID    0.1 mg at 05/04/23 0955   pantoprazole  (PROTONIX ) injection 40 mg  40 mg Intravenous Q12H Kriss Estefana DEL, DO       Current Outpatient Medications  Medication Sig Dispense Refill   amLODipine  (NORVASC ) 10 MG tablet TAKE 1 TABLET (10 MG TOTAL) BY MOUTH DAILY (Office visit for future refills) 30 tablet 1   atorvastatin  (LIPITOR) 10 MG tablet Take 1 tablet (10 mg total) by mouth daily. 90 tablet 1   Blood Pressure Monitoring (BLOOD PRESSURE CUFF) MISC 1 Units by Does not apply route daily. (Patient not taking: Reported on 07/14/2024) 1 each 0   esomeprazole  (NEXIUM ) 40 MG capsule Take 1 capsule (40 mg total) by mouth daily. 30 capsule 3   lisinopril -hydrochlorothiazide  (ZESTORETIC ) 20-12.5 MG tablet Take 2 tablets by mouth daily. 30 tablet 1   metoprolol   succinate (TOPROL -XL) 50 MG 24 hr tablet Take 1 tablet (50 mg total) by mouth daily. 90 tablet 1   Allergies as of 08/12/2024   (No Known Allergies)   Family History  Problem Relation Age of Onset   Hypertension Mother    Hypertension Sister    Diabetes Father    Social History   Socioeconomic History   Marital status: Single    Spouse name: Not on file   Number of children: 0   Years of education: Not on file   Highest education level: Some college, no degree  Occupational  History   Not on file  Tobacco Use   Smoking status: Every Day    Current packs/day: 1.00    Average packs/day: 1 pack/day for 20.0 years (20.0 ttl pk-yrs)    Types: Cigarettes   Smokeless tobacco: Never   Tobacco comments:    Wants to quit.   Vaping Use   Vaping status: Never Used  Substance and Sexual Activity   Alcohol use: Yes    Comment: occ.   Drug use: Not Currently    Types: Cocaine, Marijuana   Sexual activity: Not Currently    Birth control/protection: Surgical  Other Topics Concern   Not on file  Social History Narrative   Financial assistance approved for 100% discount at [facility] and has [facility] card per Barnie Potters   07/19/2010         Social Drivers of Health   Tobacco Use: High Risk (07/14/2024)   Patient History    Smoking Tobacco Use: Every Day    Smokeless Tobacco Use: Never    Passive Exposure: Not on file  Financial Resource Strain: Not on file  Food Insecurity: Not on file  Transportation Needs: Not on file  Physical Activity: Not on file  Stress: Not on file  Social Connections: Not on file  Intimate Partner Violence: Not on file  Depression (PHQ2-9): Low Risk (05/24/2024)   Depression (PHQ2-9)    PHQ-2 Score: 1  Recent Concern: Depression (PHQ2-9) - Medium Risk (03/04/2024)   Depression (PHQ2-9)    PHQ-2 Score: 6  Alcohol Screen: Not on file  Housing: Not on file  Utilities: Not on file  Health Literacy: Not on file   Review of Systems:  Review of Systems  Respiratory:  Positive for shortness of breath.   Cardiovascular:  Negative for chest pain.  Gastrointestinal:  Positive for abdominal pain, diarrhea, melena, nausea and vomiting.    OBJECTIVE:   Temp:  [98.2 F (36.8 C)-98.9 F (37.2 C)] 98.9 F (37.2 C) (01/02 1125) Pulse Rate:  [70-75] 70 (01/02 1124) Resp:  [16-20] 20 (01/02 1124) BP: (125-140)/(84-96) 140/96 (01/02 1124) SpO2:  [100 %] 100 % (01/02 1124) Weight:  [81.6 kg] 81.6 kg (01/02 0805)   Physical Exam Constitutional:       General: She is not in acute distress.    Appearance: She is not ill-appearing, toxic-appearing or diaphoretic.  Cardiovascular:     Rate and Rhythm: Normal rate and regular rhythm.  Pulmonary:     Effort: No respiratory distress.     Breath sounds: Normal breath sounds.  Abdominal:     General: Bowel sounds are normal. There is no distension.     Palpations: Abdomen is soft.     Tenderness: There is abdominal tenderness.  Neurological:     Mental Status: She is alert.     Labs: Recent Labs    08/12/24 0814  WBC 7.9  HGB 15.7*  HCT 47.4*  PLT 246   BMET Recent Labs    08/12/24 0814  NA 140  K 3.7  CL 104  CO2 24  GLUCOSE 116*  BUN 29*  CREATININE 1.19*  CALCIUM  10.7*   LFT Recent Labs    08/12/24 0814  PROT 7.8  ALBUMIN 4.3  AST 21  ALT 17  ALKPHOS 63  BILITOT 0.6   PT/INR No results for input(s): LABPROT, INR in the last 72 hours.  Diagnostic imaging: US  Abdomen Limited RUQ (LIVER/GB) Result Date: 08/12/2024 CLINICAL DATA:  Right upper quadrant abdominal pain EXAM: ULTRASOUND ABDOMEN LIMITED RIGHT UPPER QUADRANT COMPARISON:  June 12, 2016 FINDINGS: Gallbladder: No gallstones or wall thickening visualized. No sonographic Murphy sign noted by sonographer. Common bile duct: Diameter: 2 mm which is within normal limits. Liver: No focal lesion identified. Within normal limits in parenchymal echogenicity. Portal vein is patent on color Doppler imaging with normal direction of blood flow towards the liver. Other: None. IMPRESSION: No definite abnormality seen in the right upper quadrant of the abdomen. Electronically Signed   By: Lynwood Landy Raddle M.D.   On: 08/12/2024 10:27   CT ABDOMEN PELVIS W CONTRAST Result Date: 08/12/2024 CLINICAL DATA:  Acute generalized abdominal pain EXAM: CT ABDOMEN AND PELVIS WITH CONTRAST TECHNIQUE: Multidetector CT imaging of the abdomen and pelvis was performed using the standard protocol following bolus administration of  intravenous contrast. RADIATION DOSE REDUCTION: This exam was performed according to the departmental dose-optimization program which includes automated exposure control, adjustment of the mA and/or kV according to patient size and/or use of iterative reconstruction technique. CONTRAST:  OMNIPAQUE  IOHEXOL  300 MG/ML  SOLN COMPARISON:  February 15, 2018 FINDINGS: Lower chest: Visualized lung bases are unremarkable. Moderately dilated esophagus is noted with moderate wall thickening suggesting esophagitis. Hepatobiliary: No focal liver abnormality is seen. No gallstones, gallbladder wall thickening, or biliary dilatation. Pancreas: Unremarkable. No pancreatic ductal dilatation or surrounding inflammatory changes. Spleen: Normal in size without focal abnormality. Adrenals/Urinary Tract: Adrenal glands are unremarkable. Kidneys are normal, without renal calculi, focal lesion, or hydronephrosis. Bladder is unremarkable. Stomach/Bowel: Moderate wall thickening of distal stomach is noted concerning for gastritis or peptic ulcer disease. Possible ulceration is noted along lesser curvature. Endoscopy is recommended. There is no evidence of bowel obstruction. Appendix is unremarkable. Vascular/Lymphatic: Aortic atherosclerosis. No enlarged abdominal or pelvic lymph nodes. Reproductive: Status post hysterectomy. No adnexal masses. Other: No abdominal wall hernia or abnormality. No abdominopelvic ascites. Musculoskeletal: No acute or significant osseous findings. IMPRESSION: 1. Moderate wall thickening of distal stomach is noted concerning for gastritis or peptic ulcer disease. Possible ulceration is noted along lesser curvature. Endoscopy is recommended. 2. Moderately dilated esophagus is noted with moderate wall thickening suggesting esophagitis. 3. Aortic atherosclerosis. Aortic Atherosclerosis (ICD10-I70.0). Electronically Signed   By: Lynwood Landy Raddle M.D.   On: 08/12/2024 10:02   IMPRESSION: Melena Abnormal CT  imaging Epigastric abdominal pain NSAID use Polysubstance use  PLAN: - Recommend EGD 08/13/2024 for further evaluation of melena and abnormal CT imaging, discussed procedure with patient including risks of bleeding/infection/perforation/missed lesion/anesthesia, she verbalized understanding and elected to proceed - IV PPI every 12 hours - Okay for diet today, n.p.o. at midnight for EGD tomorrow with Dr. Dianna - Further recommendations to follow pending procedure   LOS: 0 days   [name], [facility] Gastroenterology

## 2024-08-13 ENCOUNTER — Encounter (HOSPITAL_COMMUNITY): Payer: Self-pay | Admitting: Internal Medicine

## 2024-08-13 ENCOUNTER — Other Ambulatory Visit: Payer: Self-pay

## 2024-08-13 ENCOUNTER — Inpatient Hospital Stay (HOSPITAL_COMMUNITY)
Admission: EM | Admit: 2024-08-13 | Discharge: 2024-08-22 | DRG: 682 | Disposition: A | Discharge status: Home / Self Care | Attending: Internal Medicine | Admitting: Internal Medicine

## 2024-08-13 ENCOUNTER — Emergency Department (HOSPITAL_COMMUNITY)

## 2024-08-13 ENCOUNTER — Observation Stay (HOSPITAL_COMMUNITY): Admitting: Certified Registered Nurse Anesthetist

## 2024-08-13 ENCOUNTER — Encounter (HOSPITAL_COMMUNITY): Payer: Self-pay | Admitting: Emergency Medicine

## 2024-08-13 ENCOUNTER — Encounter (HOSPITAL_COMMUNITY): Admission: EM | Disposition: A | Discharge status: Home / Self Care | Payer: Self-pay | Source: Home / Self Care

## 2024-08-13 DIAGNOSIS — F17200 Nicotine dependence, unspecified, uncomplicated: Secondary | ICD-10-CM

## 2024-08-13 DIAGNOSIS — K259 Gastric ulcer, unspecified as acute or chronic, without hemorrhage or perforation: Secondary | ICD-10-CM

## 2024-08-13 DIAGNOSIS — L989 Disorder of the skin and subcutaneous tissue, unspecified: Secondary | ICD-10-CM

## 2024-08-13 DIAGNOSIS — K2970 Gastritis, unspecified, without bleeding: Secondary | ICD-10-CM | POA: Diagnosis present

## 2024-08-13 DIAGNOSIS — I10 Essential (primary) hypertension: Secondary | ICD-10-CM | POA: Diagnosis not present

## 2024-08-13 DIAGNOSIS — R7989 Other specified abnormal findings of blood chemistry: Secondary | ICD-10-CM | POA: Diagnosis present

## 2024-08-13 DIAGNOSIS — F17210 Nicotine dependence, cigarettes, uncomplicated: Secondary | ICD-10-CM | POA: Diagnosis present

## 2024-08-13 DIAGNOSIS — K922 Gastrointestinal hemorrhage, unspecified: Secondary | ICD-10-CM | POA: Diagnosis present

## 2024-08-13 DIAGNOSIS — B3781 Candidal esophagitis: Secondary | ICD-10-CM | POA: Diagnosis present

## 2024-08-13 DIAGNOSIS — N179 Acute kidney failure, unspecified: Secondary | ICD-10-CM | POA: Diagnosis present

## 2024-08-13 DIAGNOSIS — I5A Non-ischemic myocardial injury (non-traumatic): Secondary | ICD-10-CM | POA: Diagnosis not present

## 2024-08-13 DIAGNOSIS — R079 Chest pain, unspecified: Secondary | ICD-10-CM

## 2024-08-13 DIAGNOSIS — E785 Hyperlipidemia, unspecified: Secondary | ICD-10-CM | POA: Diagnosis present

## 2024-08-13 DIAGNOSIS — E872 Acidosis, unspecified: Secondary | ICD-10-CM | POA: Diagnosis present

## 2024-08-13 DIAGNOSIS — E860 Dehydration: Secondary | ICD-10-CM | POA: Diagnosis present

## 2024-08-13 DIAGNOSIS — Z833 Family history of diabetes mellitus: Secondary | ICD-10-CM

## 2024-08-13 DIAGNOSIS — F319 Bipolar disorder, unspecified: Secondary | ICD-10-CM | POA: Diagnosis present

## 2024-08-13 DIAGNOSIS — E66811 Obesity, class 1: Secondary | ICD-10-CM | POA: Diagnosis present

## 2024-08-13 DIAGNOSIS — Z7409 Other reduced mobility: Secondary | ICD-10-CM | POA: Diagnosis present

## 2024-08-13 DIAGNOSIS — N200 Calculus of kidney: Secondary | ICD-10-CM | POA: Diagnosis present

## 2024-08-13 DIAGNOSIS — Z6834 Body mass index (BMI) 34.0-34.9, adult: Secondary | ICD-10-CM

## 2024-08-13 DIAGNOSIS — M79604 Pain in right leg: Principal | ICD-10-CM

## 2024-08-13 DIAGNOSIS — I2489 Other forms of acute ischemic heart disease: Secondary | ICD-10-CM | POA: Diagnosis present

## 2024-08-13 DIAGNOSIS — K254 Chronic or unspecified gastric ulcer with hemorrhage: Secondary | ICD-10-CM | POA: Diagnosis present

## 2024-08-13 DIAGNOSIS — Z79899 Other long term (current) drug therapy: Secondary | ICD-10-CM

## 2024-08-13 DIAGNOSIS — E6609 Other obesity due to excess calories: Secondary | ICD-10-CM

## 2024-08-13 DIAGNOSIS — T39395A Adverse effect of other nonsteroidal anti-inflammatory drugs [NSAID], initial encounter: Secondary | ICD-10-CM | POA: Diagnosis present

## 2024-08-13 DIAGNOSIS — D6959 Other secondary thrombocytopenia: Secondary | ICD-10-CM | POA: Diagnosis not present

## 2024-08-13 DIAGNOSIS — R2231 Localized swelling, mass and lump, right upper limb: Secondary | ICD-10-CM

## 2024-08-13 DIAGNOSIS — Z8249 Family history of ischemic heart disease and other diseases of the circulatory system: Secondary | ICD-10-CM

## 2024-08-13 HISTORY — PX: ESOPHAGOGASTRODUODENOSCOPY: SHX5428

## 2024-08-13 HISTORY — PX: BONE BIOPSY: SHX375

## 2024-08-13 LAB — CBC WITH DIFFERENTIAL/PLATELET
Abs Immature Granulocytes: 0.03 K/uL (ref 0.00–0.07)
Basophils Absolute: 0 K/uL (ref 0.0–0.1)
Basophils Relative: 0 %
Eosinophils Absolute: 0 K/uL (ref 0.0–0.5)
Eosinophils Relative: 0 %
HCT: 44 % (ref 36.0–46.0)
Hemoglobin: 14.4 g/dL (ref 12.0–15.0)
Immature Granulocytes: 0 %
Lymphocytes Relative: 14 %
Lymphs Abs: 1.6 K/uL (ref 0.7–4.0)
MCH: 29.6 pg (ref 26.0–34.0)
MCHC: 32.7 g/dL (ref 30.0–36.0)
MCV: 90.3 fL (ref 80.0–100.0)
Monocytes Absolute: 0.7 K/uL (ref 0.1–1.0)
Monocytes Relative: 6 %
Neutro Abs: 8.5 K/uL — ABNORMAL HIGH (ref 1.7–7.7)
Neutrophils Relative %: 80 %
Platelets: 209 K/uL (ref 150–400)
RBC: 4.87 MIL/uL (ref 3.87–5.11)
RDW: 14.7 % (ref 11.5–15.5)
WBC: 10.8 K/uL — ABNORMAL HIGH (ref 4.0–10.5)
nRBC: 0 % (ref 0.0–0.2)

## 2024-08-13 LAB — CBC
HCT: 42.5 % (ref 36.0–46.0)
Hemoglobin: 13.8 g/dL (ref 12.0–15.0)
MCH: 28.9 pg (ref 26.0–34.0)
MCHC: 32.5 g/dL (ref 30.0–36.0)
MCV: 88.9 fL (ref 80.0–100.0)
Platelets: 195 K/uL (ref 150–400)
RBC: 4.78 MIL/uL (ref 3.87–5.11)
RDW: 14.6 % (ref 11.5–15.5)
WBC: 7.6 K/uL (ref 4.0–10.5)
nRBC: 0 % (ref 0.0–0.2)

## 2024-08-13 LAB — COMPREHENSIVE METABOLIC PANEL WITH GFR
ALT: 20 U/L (ref 0–44)
AST: 37 U/L (ref 15–41)
Albumin: 4.3 g/dL (ref 3.5–5.0)
Alkaline Phosphatase: 60 U/L (ref 38–126)
Anion gap: 13 (ref 5–15)
BUN: 41 mg/dL — ABNORMAL HIGH (ref 8–23)
CO2: 26 mmol/L (ref 22–32)
Calcium: 10.7 mg/dL — ABNORMAL HIGH (ref 8.9–10.3)
Chloride: 104 mmol/L (ref 98–111)
Creatinine, Ser: 2.66 mg/dL — ABNORMAL HIGH (ref 0.44–1.00)
GFR, Estimated: 20 mL/min — ABNORMAL LOW
Glucose, Bld: 107 mg/dL — ABNORMAL HIGH (ref 70–99)
Potassium: 4.2 mmol/L (ref 3.5–5.1)
Sodium: 143 mmol/L (ref 135–145)
Total Bilirubin: 0.4 mg/dL (ref 0.0–1.2)
Total Protein: 8 g/dL (ref 6.5–8.1)

## 2024-08-13 LAB — BASIC METABOLIC PANEL WITH GFR
Anion gap: 12 (ref 5–15)
BUN: 38 mg/dL — ABNORMAL HIGH (ref 8–23)
CO2: 24 mmol/L (ref 22–32)
Calcium: 10.2 mg/dL (ref 8.9–10.3)
Chloride: 104 mmol/L (ref 98–111)
Creatinine, Ser: 1.63 mg/dL — ABNORMAL HIGH (ref 0.44–1.00)
GFR, Estimated: 35 mL/min — ABNORMAL LOW
Glucose, Bld: 104 mg/dL — ABNORMAL HIGH (ref 70–99)
Potassium: 3.9 mmol/L (ref 3.5–5.1)
Sodium: 140 mmol/L (ref 135–145)

## 2024-08-13 LAB — PROTIME-INR
INR: 1 (ref 0.8–1.2)
Prothrombin Time: 13.5 s (ref 11.4–15.2)

## 2024-08-13 LAB — LIPASE, BLOOD: Lipase: 36 U/L (ref 11–51)

## 2024-08-13 LAB — SURGICAL PCR SCREEN
MRSA, PCR: NEGATIVE
Staphylococcus aureus: NEGATIVE

## 2024-08-13 LAB — TROPONIN T, HIGH SENSITIVITY: Troponin T High Sensitivity: 121 ng/L (ref 0–19)

## 2024-08-13 MED ORDER — FLUCONAZOLE IN SODIUM CHLORIDE 400-0.9 MG/200ML-% IV SOLN
400.0000 mg | Freq: Every day | INTRAVENOUS | Status: DC
  Filled 2024-08-13: qty 200

## 2024-08-13 MED ORDER — HYDROMORPHONE HCL 1 MG/ML IJ SOLN
1.0000 mg | Freq: Once | INTRAMUSCULAR | Status: AC
  Administered 2024-08-13: 1 mg via INTRAVENOUS
  Filled 2024-08-13: qty 1

## 2024-08-13 MED ORDER — LACTATED RINGERS IV SOLN: INTRAVENOUS | Status: DC | PRN

## 2024-08-13 MED ORDER — VASOPRESSIN 20 UNIT/ML IV SOLN
INTRAVENOUS | Status: AC
  Filled 2024-08-13: qty 1

## 2024-08-13 MED ORDER — SODIUM CHLORIDE 0.9 % IV SOLN: INTRAVENOUS | Status: DC

## 2024-08-13 MED ORDER — MUPIROCIN 2 % EX OINT
1.0000 | TOPICAL_OINTMENT | Freq: Two times a day (BID) | CUTANEOUS | Status: DC
  Administered 2024-08-13: 1 via NASAL
  Filled 2024-08-13: qty 22

## 2024-08-13 MED ORDER — SUCCINYLCHOLINE CHLORIDE 200 MG/10ML IV SOSY
PREFILLED_SYRINGE | INTRAVENOUS | Status: DC | PRN
  Administered 2024-08-13: 120 mg via INTRAVENOUS

## 2024-08-13 MED ORDER — PROPOFOL 1000 MG/100ML IV EMUL
INTRAVENOUS | Status: AC
  Filled 2024-08-13: qty 100

## 2024-08-13 MED ORDER — PHENYLEPHRINE 80 MCG/ML (10ML) SYRINGE FOR IV PUSH (FOR BLOOD PRESSURE SUPPORT)
PREFILLED_SYRINGE | INTRAVENOUS | Status: DC | PRN
  Administered 2024-08-13: 160 ug via INTRAVENOUS
  Administered 2024-08-13: 200 ug via INTRAVENOUS
  Administered 2024-08-13: 160 ug via INTRAVENOUS

## 2024-08-13 MED ORDER — FENTANYL CITRATE (PF) 100 MCG/2ML IJ SOLN
INTRAMUSCULAR | Status: DC | PRN
  Administered 2024-08-13: 100 ug via INTRAVENOUS

## 2024-08-13 MED ORDER — MIDAZOLAM HCL 2 MG/2ML IJ SOLN
INTRAMUSCULAR | Status: AC
  Filled 2024-08-13: qty 2

## 2024-08-13 MED ORDER — PANTOPRAZOLE SODIUM 40 MG IV SOLR
80.0000 mg | Freq: Once | INTRAVENOUS | Status: AC
  Administered 2024-08-13: 80 mg via INTRAVENOUS
  Filled 2024-08-13: qty 20

## 2024-08-13 MED ORDER — FENTANYL CITRATE (PF) 100 MCG/2ML IJ SOLN
INTRAMUSCULAR | Status: AC
  Filled 2024-08-13: qty 2

## 2024-08-13 MED ORDER — MIDAZOLAM HCL 5 MG/5ML IJ SOLN
INTRAMUSCULAR | Status: DC | PRN
  Administered 2024-08-13: 2 mg via INTRAVENOUS

## 2024-08-13 MED ORDER — PROPOFOL 10 MG/ML IV BOLUS
INTRAVENOUS | Status: DC | PRN
  Administered 2024-08-13: 100 mg via INTRAVENOUS

## 2024-08-13 MED ORDER — VASOPRESSIN 20 UNIT/ML IV SOLN
INTRAVENOUS | Status: DC | PRN
  Administered 2024-08-13 (×2): 2 [IU] via INTRAVENOUS
  Administered 2024-08-13: 1 [IU] via INTRAVENOUS

## 2024-08-13 MED ORDER — DEXAMETHASONE SODIUM PHOSPHATE 4 MG/ML IJ SOLN
INTRAMUSCULAR | Status: DC | PRN
  Administered 2024-08-13: 5 mg via INTRAVENOUS

## 2024-08-13 MED ORDER — LACTATED RINGERS IV BOLUS
1000.0000 mL | Freq: Once | INTRAVENOUS | Status: AC
  Administered 2024-08-13: 1000 mL via INTRAVENOUS

## 2024-08-13 MED ORDER — LIDOCAINE 2% (20 MG/ML) 5 ML SYRINGE
INTRAMUSCULAR | Status: DC | PRN
  Administered 2024-08-13: 100 mg via INTRAVENOUS

## 2024-08-13 MED ORDER — HALOPERIDOL LACTATE 5 MG/ML IJ SOLN
2.0000 mg | Freq: Once | INTRAMUSCULAR | Status: AC
  Administered 2024-08-13: 2 mg via INTRAVENOUS
  Filled 2024-08-13: qty 1

## 2024-08-13 MED ORDER — ONDANSETRON HCL 4 MG/2ML IJ SOLN
INTRAMUSCULAR | Status: DC | PRN
  Administered 2024-08-13: 4 mg via INTRAVENOUS

## 2024-08-13 NOTE — Anesthesia Preprocedure Evaluation (Addendum)
"                                    Anesthesia Evaluation  Patient identified by MRN, date of birth, ID band Patient awake    Reviewed: Allergy & Precautions, NPO status , Patient's Chart, lab work & pertinent test results, reviewed documented beta blocker date and time   History of Anesthesia Complications Negative for: history of anesthetic complications  Airway Mallampati: II  TM Distance: >3 FB Neck ROM: Full    Dental  (+) Dental Advisory Given, Missing, Poor Dentition   Pulmonary Current Smoker and Patient abstained from smoking.   Pulmonary exam normal        Cardiovascular hypertension, Pt. on medications and Pt. on home beta blockers Normal cardiovascular exam     Neuro/Psych  PSYCHIATRIC DISORDERS Anxiety Depression Bipolar Disorder   negative neurological ROS     GI/Hepatic ,GERD  Controlled,,(+)     substance abuse  alcohol use, cocaine use, marijuana use and IV drug use  Endo/Other   Obesity   Renal/GU Renal InsufficiencyRenal disease     Musculoskeletal negative musculoskeletal ROS (+)    Abdominal   Peds  Hematology  (+) REFUSES BLOOD PRODUCTS  Anesthesia Other Findings   Reproductive/Obstetrics  s/p hysterectomy                               Anesthesia Physical Anesthesia Plan  ASA: 3  Anesthesia Plan: General   Post-op Pain Management: Minimal or no pain anticipated   Induction: Intravenous and Rapid sequence  PONV Risk Score and Plan: 2 and Propofol  infusion and Treatment may vary due to age or medical condition  Airway Management Planned: Oral ETT  Additional Equipment: None  Intra-op Plan:   Post-operative Plan: Extubation in OR  Informed Consent: I have reviewed the patients History and Physical, chart, labs and discussed the procedure including the risks, benefits and alternatives for the proposed anesthesia with the patient or authorized representative who has indicated his/her  understanding and acceptance.     Dental advisory given  Plan Discussed with: CRNA and Anesthesiologist  Anesthesia Plan Comments: (GETA with RSI given active nausea with vomiting in preop )         Anesthesia Quick Evaluation  "

## 2024-08-13 NOTE — Interval H&P Note (Signed)
 History and Physical Interval Note:  08/13/2024 7:42 AM  Jennifer [name]  has presented today for surgery, with the diagnosis of Melena, abdominal pain, abnormal CT.  The various methods of treatment have been discussed with the patient and family. After consideration of risks, benefits and other options for treatment, the patient has consented to  Procedures: EGD (ESOPHAGOGASTRODUODENOSCOPY) (N/A) as a surgical intervention.  The patient's history has been reviewed, patient examined, no change in status, stable for surgery.  I have reviewed the patient's chart and labs.  Questions were answered to the patient's satisfaction.     [name]

## 2024-08-13 NOTE — ED Triage Notes (Signed)
" °  Patient BIB EMS for melena that has been going on for several days.  Patient was admitted to [facility] yesterday and had EGD this morning.  After procedure patient had to leave AMA to check on her children.  Patient states she was drinking pedialyte at home and took her nexium .  Patient states she started having black/tarry stools and severe abdominal pain.  Pain 10/10, sharp/stabbing.   "

## 2024-08-13 NOTE — Anesthesia Postprocedure Evaluation (Signed)
"   Anesthesia Post Note  Patient: Jennifer [name]  Procedure(s) Performed: EGD (ESOPHAGOGASTRODUODENOSCOPY) BIOPSY, GI     Patient location during evaluation: PACU Anesthesia Type: General Level of consciousness: awake and alert Pain management: pain level controlled Vital Signs Assessment: post-procedure vital signs reviewed and stable Respiratory status: spontaneous breathing, nonlabored ventilation and respiratory function stable Cardiovascular status: stable and blood pressure returned to baseline Anesthetic complications: no   No notable events documented.  Last Vitals:  Vitals:   08/13/24 0859 08/13/24 0900  BP:  102/66  Pulse:    Resp:    Temp:    SpO2: 96%     Last Pain:  Vitals:   08/13/24 0859  TempSrc:   PainSc: 0-No pain                 Debby FORBES Like      "

## 2024-08-13 NOTE — Anesthesia Procedure Notes (Signed)
 Procedure Name: Intubation Date/Time: 08/13/2024 7:49 AM  Performed by: Judythe Tanda Aran, CRNAPre-anesthesia Checklist: Patient identified, Emergency Drugs available, Suction available and Patient being monitored Patient Re-evaluated:Patient Re-evaluated prior to induction Oxygen Delivery Method: Circle system utilized Preoxygenation: Pre-oxygenation with 100% oxygen Induction Type: IV induction, Cricoid Pressure applied and Rapid sequence Laryngoscope Size: 2 and Miller Grade View: Grade I Tube type: Oral Number of attempts: 1 Airway Equipment and Method: Stylet Placement Confirmation: ETT inserted through vocal cords under direct vision, positive ETCO2 and breath sounds checked- equal and bilateral Tube secured with: Tape Dental Injury: Teeth and Oropharynx as per pre-operative assessment

## 2024-08-13 NOTE — Op Note (Signed)
 [facility] Patient Name: Jennifer [name] Procedure Date: 08/13/2024 MRN: [id number] Attending MD: [name] , MD, [id number] Date of Birth: [birth year] CSN: [id number] Age: 64 Admit Type: Inpatient Procedure:                Upper GI endoscopy Indications:              Melena, Abnormal CT of the GI tract Providers:                Jerrell KYM Sol, MD, Collene Edu, RN, Haskel Chris, Technician Referring MD:             hospital team Medicines:                Propofol  per Anesthesia, Monitored Anesthesia Care Complications:            No immediate complications. Estimated Blood Loss:     Estimated blood loss was minimal. Procedure:                Pre-Anesthesia Assessment:                           - Prior to the procedure, a History and Physical                            was performed, and patient medications and                            allergies were reviewed. The patient's tolerance of                            previous anesthesia was also reviewed. The risks                            and benefits of the procedure and the sedation                            options and risks were discussed with the patient.                            All questions were answered, and informed consent                            was obtained. Prior Anticoagulants: The patient has                            taken no anticoagulant or antiplatelet agents. ASA                            Grade Assessment: III - A patient with severe                            systemic disease. After reviewing the risks and  benefits, the patient was deemed in satisfactory                            condition to undergo the procedure.                           After obtaining informed consent, the endoscope was                            passed under direct vision. Throughout the                            procedure, the patient's blood  pressure, pulse, and                            oxygen saturations were monitored continuously. The                            [id number]-H190 ([id number]) Olympus endoscope was introduced                            through the mouth, and advanced to the second part                            of duodenum. The upper GI endoscopy was                            accomplished without difficulty. The patient                            tolerated the procedure fairly well. Findings:      Patchy, white plaques were found in the mid esophagus and in the distal       esophagus. Cells for cytology were obtained by brushing. Estimated blood       loss was minimal.      The Z-line was regular and was found 45 cm from the incisors.      One non-bleeding cratered gastric ulcer with pigmented material was       found in the gastric antrum. The lesion was 20 mm in largest dimension.      Segmental moderate inflammation characterized by congestion (edema) and       erythema was found in the gastric antrum.      The cardia and gastric fundus were normal on retroflexion.      Patchy mild mucosal changes characterized by congestion and erythema       were found in the duodenal bulb.      The second portion of the duodenum was normal. Impression:               - Esophageal plaques were found, suspicious for                            candidiasis. Cells for cytology obtained.                           - Z-line regular, 45 cm from the  incisors.                           - Non-bleeding gastric ulcer with pigmented                            material.                           - Gastritis.                           - Mucosal changes in the duodenum.                           - Normal second portion of the duodenum. Moderate Sedation:      N/A - MAC procedure Recommendation:           - NPO.                           - Observe patient's clinical course.                           - Await pathology results. Procedure  Code(s):        --- Professional ---                           [phone], Esophagogastroduodenoscopy, flexible,                            transoral; diagnostic, including collection of                            specimen(s) by brushing or washing, when performed                            (separate procedure) Diagnosis Code(s):        --- Professional ---                           K92.1, Melena (includes Hematochezia)                           K25.9, Gastric ulcer, unspecified as acute or                            chronic, without hemorrhage or perforation                           K29.70, Gastritis, unspecified, without bleeding                           K22.9, Disease of esophagus, unspecified                           K31.89, Other diseases of stomach and duodenum  R93.3, Abnormal findings on diagnostic imaging of                            other parts of digestive tract CPT copyright 2022 [employer]. All rights reserved. The codes documented in this report are preliminary and upon coder review may  be revised to meet current compliance requirements. [name], MD 08/13/2024 8:17:10 AM This report has been signed electronically. Number of Addenda: 0

## 2024-08-13 NOTE — Progress Notes (Addendum)
 Dr. Dianna notified and called to patient to review results. Patient asked if she could leave to check on her child and come back to the hospital. Explained to patient that we are unable to hold beds while patients leave due to liability. Patient still adamant about leaving. Sister and niece also arrived and offered to take care of child at home but unsuccessful in convincing patient to stay. Patient made aware of risks and signed AMA paper. IV removed - WNL. Patient assisted off unit with family in NAD. MDs made aware of patients decision to leave AMA

## 2024-08-13 NOTE — Transfer of Care (Signed)
 Immediate Anesthesia Transfer of Care Note  Patient: Jennifer [name] Na  Procedure(s) Performed: EGD (ESOPHAGOGASTRODUODENOSCOPY) BIOPSY, GI  Patient Location: PACU  Anesthesia Type:General  Level of Consciousness: awake  Airway & Oxygen Therapy: Patient Spontanous Breathing and Patient connected to face mask  Post-op Assessment: Report given to RN and Post -op Vital signs reviewed and stable  Post vital signs: Reviewed and stable  Last Vitals:  Vitals Value Taken Time  BP    Temp    Pulse 70 08/13/24 08:31  Resp    SpO2 96 % 08/13/24 08:31  Vitals shown include unfiled device data.  Last Pain:  Vitals:   08/13/24 0714  TempSrc:   PainSc: 10-Worst pain ever         Complications: No notable events documented.

## 2024-08-13 NOTE — Progress Notes (Addendum)
 Floor staff asked by endo to transfer patient back to floor after procedure. After arriving in PACU, patient found stating I'm ready to go home PACU nurse advised patient that MD was not ready to discharge her. Patient brought back to room, ambulated to bed with stand by assist. Patient continues to state she has to go home, states I have to go get my baby. Explained recommendations from GI to patient, and asked MD to call and update her on results since she did not remember speaking to him after the procedure. Hospitalist Dr. Sebastian also made aware of patient's desire to leave AMA.

## 2024-08-13 NOTE — Discharge Summary (Signed)
 Physician Discharge Summary  Jennifer [name] FMW:[id number] DOB: [birth year] DOA: 08/12/2024  PCP: Jennifer Clam, MD     Patient left AMA  Admit date: 08/12/2024 Discharge date: 08/13/2024  Time spent: 40 minutes  Recommendations for Outpatient Follow-up:  Patient left AMA   Discharge Diagnoses:  Principal Problem:   Upper GI bleed Active Problems:   AKI (acute kidney injury)   Candidiasis, esophageal (HCC)   Gastric ulcer   Gastritis   Discharge Condition: Patient left AMA  Diet recommendation: Patient left AMA  Filed Weights   08/12/24 0805  Weight: 81.6 kg    History of present illness:  HPI per Dr. Zella Gabriella [name] Na is a 64 y.o. female with medical history significant for bipolar 1 disorder, hypertension, polysubstance abuse, ongoing tobacco abuse being admitted to the hospital with abdominal pain and vomiting concerning for upper GI bleed and possible gastric ulceration.  Patient states she has never had endoscopy or GI bleeding before, she has used Aleve  for aches and pains for several years.  She does have reflux symptoms, and her PCP in December did recommend possible GI evaluation and endoscopy.  There was also plan to check for H. pylori, however patient did not complete testing.  In any case, patient was in her usual state of health until a couple days ago when she started having nausea, severe abdominal pain, vomiting and emesis both of which looked very dark.  No frank bleeding.  She is not on any blood thinners.   Hospital Course:  #1 upper GI bleeds/gastric ulcer/esophageal candidiasis - Patient noted to have presented with melanotic stools, abdominal pain, emesis with concern for gastric ulceration. - Patient seen in consultation by GI underwent upper endoscopy 08/13/2024 which was suspicious for esophageal candidiasis, nonbleeding gastric ulcer with pigmented material, gastritis. - Patient subsequently left AMA.  2.  Hypertension - Patient noted to  have borderline blood pressure.   - Antihypertensive Medications held - Patient left AMA.  Procedures: Upper endoscopy per Dr. Dianna 08/13/2024--esophageal plaques found suspicious for candidiasis.  Cells for cytology obtained.  Nonbleeding gastric ulcer with pigmented material.  Gastritis.  Mucosal changes in the duodenum.  Normal second portion of duodenum.  Consultations: Gastroenterology: Dr. Kriss 08/12/2024  Discharge Exam: Vitals:   08/13/24 0859 08/13/24 0900  BP:  102/66  Pulse:    Resp:    Temp:    SpO2: 96%     General: Patient left AMA Cardiovascular: Left AMA Respiratory: Left AMA  Discharge Instructions     Allergies[1]    The results of significant diagnostics from this hospitalization (including imaging, microbiology, ancillary and laboratory) are listed below for reference.    Significant Diagnostic Studies: US  Abdomen Limited RUQ (LIVER/GB) Result Date: 08/12/2024 CLINICAL DATA:  Right upper quadrant abdominal pain EXAM: ULTRASOUND ABDOMEN LIMITED RIGHT UPPER QUADRANT COMPARISON:  June 12, 2016 FINDINGS: Gallbladder: No gallstones or wall thickening visualized. No sonographic Murphy sign noted by sonographer. Common bile duct: Diameter: 2 mm which is within normal limits. Liver: No focal lesion identified. Within normal limits in parenchymal echogenicity. Portal vein is patent on color Doppler imaging with normal direction of blood flow towards the liver. Other: None. IMPRESSION: No definite abnormality seen in the right upper quadrant of the abdomen. Electronically Signed   By: Lynwood Landy Raddle M.D.   On: 08/12/2024 10:27   CT ABDOMEN PELVIS W CONTRAST Result Date: 08/12/2024 CLINICAL DATA:  Acute generalized abdominal pain EXAM: CT ABDOMEN AND PELVIS WITH CONTRAST TECHNIQUE: Multidetector  CT imaging of the abdomen and pelvis was performed using the standard protocol following bolus administration of intravenous contrast. RADIATION DOSE REDUCTION: This  exam was performed according to the departmental dose-optimization program which includes automated exposure control, adjustment of the mA and/or kV according to patient size and/or use of iterative reconstruction technique. CONTRAST:  OMNIPAQUE  IOHEXOL  300 MG/ML  SOLN COMPARISON:  February 15, 2018 FINDINGS: Lower chest: Visualized lung bases are unremarkable. Moderately dilated esophagus is noted with moderate wall thickening suggesting esophagitis. Hepatobiliary: No focal liver abnormality is seen. No gallstones, gallbladder wall thickening, or biliary dilatation. Pancreas: Unremarkable. No pancreatic ductal dilatation or surrounding inflammatory changes. Spleen: Normal in size without focal abnormality. Adrenals/Urinary Tract: Adrenal glands are unremarkable. Kidneys are normal, without renal calculi, focal lesion, or hydronephrosis. Bladder is unremarkable. Stomach/Bowel: Moderate wall thickening of distal stomach is noted concerning for gastritis or peptic ulcer disease. Possible ulceration is noted along lesser curvature. Endoscopy is recommended. There is no evidence of bowel obstruction. Appendix is unremarkable. Vascular/Lymphatic: Aortic atherosclerosis. No enlarged abdominal or pelvic lymph nodes. Reproductive: Status post hysterectomy. No adnexal masses. Other: No abdominal wall hernia or abnormality. No abdominopelvic ascites. Musculoskeletal: No acute or significant osseous findings. IMPRESSION: 1. Moderate wall thickening of distal stomach is noted concerning for gastritis or peptic ulcer disease. Possible ulceration is noted along lesser curvature. Endoscopy is recommended. 2. Moderately dilated esophagus is noted with moderate wall thickening suggesting esophagitis. 3. Aortic atherosclerosis. Aortic Atherosclerosis (ICD10-I70.0). Electronically Signed   By: Lynwood Landy Raddle M.D.   On: 08/12/2024 10:02    Microbiology: Recent Results (from the past 240 hours)  Surgical PCR screen     Status:  None   Collection Time: 08/13/24  3:31 AM   Specimen: Nasal Mucosa; Nasal Swab  Result Value Ref Range Status   MRSA, PCR NEGATIVE NEGATIVE Final   Staphylococcus aureus NEGATIVE NEGATIVE Final    Comment: (NOTE) The Xpert SA Assay (FDA approved for NASAL specimens in patients 42 years of age and older), is one component of a comprehensive surveillance program. It is not intended to diagnose infection nor to guide or monitor treatment. Performed at [facility], [address] [place], [place] [address]      Labs: Basic Metabolic Panel: Recent Labs  Lab 08/12/24 0814 08/13/24 0519  NA 140 140  K 3.7 3.9  CL 104 104  CO2 24 24  GLUCOSE 116* 104*  BUN 29* 38*  CREATININE 1.19* 1.63*  CALCIUM  10.7* 10.2   Liver Function Tests: Recent Labs  Lab 08/12/24 0814  AST 21  ALT 17  ALKPHOS 63  BILITOT 0.6  PROT 7.8  ALBUMIN 4.3   Recent Labs  Lab 08/12/24 0814  LIPASE 26   No results for input(s): AMMONIA in the last 168 hours. CBC: Recent Labs  Lab 08/12/24 0814 08/12/24 1838 08/13/24 0519  WBC 7.9  --  7.6  HGB 15.7* 14.1 13.8  HCT 47.4* 43.1 42.5  MCV 89.1  --  88.9  PLT 246  --  195   Cardiac Enzymes: No results for input(s): CKTOTAL, CKMB, CKMBINDEX, TROPONINI in the last 168 hours. BNP: BNP (last 3 results) No results for input(s): BNP in the last 8760 hours.  ProBNP (last 3 results) No results for input(s): PROBNP in the last 8760 hours.  CBG: Recent Labs  Lab 08/12/24 0808  GLUCAP 110*       Signed:  Toribio Hummer MD.  [facility] 08/13/2024, 8:16 PM        [  1] No Known Allergies

## 2024-08-14 ENCOUNTER — Observation Stay (HOSPITAL_COMMUNITY)

## 2024-08-14 ENCOUNTER — Encounter (HOSPITAL_COMMUNITY): Payer: Self-pay | Admitting: Gastroenterology

## 2024-08-14 ENCOUNTER — Inpatient Hospital Stay (HOSPITAL_COMMUNITY)

## 2024-08-14 DIAGNOSIS — N200 Calculus of kidney: Secondary | ICD-10-CM | POA: Diagnosis present

## 2024-08-14 DIAGNOSIS — R7989 Other specified abnormal findings of blood chemistry: Secondary | ICD-10-CM | POA: Diagnosis present

## 2024-08-14 DIAGNOSIS — F319 Bipolar disorder, unspecified: Secondary | ICD-10-CM | POA: Diagnosis present

## 2024-08-14 DIAGNOSIS — K2970 Gastritis, unspecified, without bleeding: Secondary | ICD-10-CM | POA: Diagnosis present

## 2024-08-14 DIAGNOSIS — R079 Chest pain, unspecified: Secondary | ICD-10-CM | POA: Diagnosis not present

## 2024-08-14 DIAGNOSIS — Z7409 Other reduced mobility: Secondary | ICD-10-CM | POA: Diagnosis present

## 2024-08-14 DIAGNOSIS — I10 Essential (primary) hypertension: Secondary | ICD-10-CM | POA: Diagnosis present

## 2024-08-14 DIAGNOSIS — D6959 Other secondary thrombocytopenia: Secondary | ICD-10-CM | POA: Diagnosis not present

## 2024-08-14 DIAGNOSIS — Z6834 Body mass index (BMI) 34.0-34.9, adult: Secondary | ICD-10-CM | POA: Diagnosis not present

## 2024-08-14 DIAGNOSIS — K253 Acute gastric ulcer without hemorrhage or perforation: Secondary | ICD-10-CM | POA: Diagnosis not present

## 2024-08-14 DIAGNOSIS — Z833 Family history of diabetes mellitus: Secondary | ICD-10-CM | POA: Diagnosis not present

## 2024-08-14 DIAGNOSIS — R1013 Epigastric pain: Secondary | ICD-10-CM | POA: Diagnosis present

## 2024-08-14 DIAGNOSIS — K922 Gastrointestinal hemorrhage, unspecified: Secondary | ICD-10-CM | POA: Diagnosis present

## 2024-08-14 DIAGNOSIS — F17210 Nicotine dependence, cigarettes, uncomplicated: Secondary | ICD-10-CM | POA: Diagnosis present

## 2024-08-14 DIAGNOSIS — E66811 Obesity, class 1: Secondary | ICD-10-CM | POA: Diagnosis present

## 2024-08-14 DIAGNOSIS — N179 Acute kidney failure, unspecified: Secondary | ICD-10-CM | POA: Diagnosis present

## 2024-08-14 DIAGNOSIS — K254 Chronic or unspecified gastric ulcer with hemorrhage: Secondary | ICD-10-CM | POA: Diagnosis present

## 2024-08-14 DIAGNOSIS — E860 Dehydration: Secondary | ICD-10-CM | POA: Diagnosis present

## 2024-08-14 DIAGNOSIS — Z79899 Other long term (current) drug therapy: Secondary | ICD-10-CM | POA: Diagnosis not present

## 2024-08-14 DIAGNOSIS — I5A Non-ischemic myocardial injury (non-traumatic): Secondary | ICD-10-CM | POA: Diagnosis not present

## 2024-08-14 DIAGNOSIS — E872 Acidosis, unspecified: Secondary | ICD-10-CM | POA: Diagnosis present

## 2024-08-14 DIAGNOSIS — Z8249 Family history of ischemic heart disease and other diseases of the circulatory system: Secondary | ICD-10-CM | POA: Diagnosis not present

## 2024-08-14 DIAGNOSIS — E785 Hyperlipidemia, unspecified: Secondary | ICD-10-CM | POA: Diagnosis present

## 2024-08-14 DIAGNOSIS — B3781 Candidal esophagitis: Secondary | ICD-10-CM | POA: Diagnosis present

## 2024-08-14 DIAGNOSIS — I2489 Other forms of acute ischemic heart disease: Secondary | ICD-10-CM | POA: Diagnosis present

## 2024-08-14 DIAGNOSIS — T39395A Adverse effect of other nonsteroidal anti-inflammatory drugs [NSAID], initial encounter: Secondary | ICD-10-CM | POA: Diagnosis present

## 2024-08-14 LAB — BASIC METABOLIC PANEL WITH GFR
Anion gap: 12 (ref 5–15)
BUN: 40 mg/dL — ABNORMAL HIGH (ref 8–23)
CO2: 22 mmol/L (ref 22–32)
Calcium: 9.6 mg/dL (ref 8.9–10.3)
Chloride: 108 mmol/L (ref 98–111)
Creatinine, Ser: 1.74 mg/dL — ABNORMAL HIGH (ref 0.44–1.00)
GFR, Estimated: 33 mL/min — ABNORMAL LOW
Glucose, Bld: 115 mg/dL — ABNORMAL HIGH (ref 70–99)
Potassium: 4.4 mmol/L (ref 3.5–5.1)
Sodium: 141 mmol/L (ref 135–145)

## 2024-08-14 LAB — URINALYSIS, ROUTINE W REFLEX MICROSCOPIC
Bilirubin Urine: NEGATIVE
Glucose, UA: NEGATIVE mg/dL
Ketones, ur: NEGATIVE mg/dL
Leukocytes,Ua: NEGATIVE
Nitrite: NEGATIVE
Protein, ur: 100 mg/dL — AB
Specific Gravity, Urine: 1.024 (ref 1.005–1.030)
pH: 5 (ref 5.0–8.0)

## 2024-08-14 LAB — MAGNESIUM: Magnesium: 2.4 mg/dL (ref 1.7–2.4)

## 2024-08-14 LAB — CBC
HCT: 38.8 % (ref 36.0–46.0)
Hemoglobin: 12.6 g/dL (ref 12.0–15.0)
MCH: 29 pg (ref 26.0–34.0)
MCHC: 32.5 g/dL (ref 30.0–36.0)
MCV: 89.4 fL (ref 80.0–100.0)
Platelets: 164 K/uL (ref 150–400)
RBC: 4.34 MIL/uL (ref 3.87–5.11)
RDW: 14.4 % (ref 11.5–15.5)
WBC: 8.1 K/uL (ref 4.0–10.5)
nRBC: 0 % (ref 0.0–0.2)

## 2024-08-14 LAB — ECHOCARDIOGRAM COMPLETE
AR max vel: 3.38 cm2
AV Area VTI: 3.53 cm2
AV Area mean vel: 3.91 cm2
AV Mean grad: 10 mmHg
AV Peak grad: 19 mmHg
Ao pk vel: 2.18 m/s
Area-P 1/2: 6.88 cm2
Est EF: >75
Height: 64 in
S' Lateral: 2.2 cm
Weight: 2885.38 [oz_av]

## 2024-08-14 LAB — URINE DRUG SCREEN
Amphetamines: NEGATIVE
Barbiturates: NEGATIVE
Benzodiazepines: POSITIVE — AB
Cocaine: POSITIVE — AB
Fentanyl: POSITIVE — AB
Methadone Scn, Ur: NEGATIVE
Opiates: POSITIVE — AB
Tetrahydrocannabinol: NEGATIVE

## 2024-08-14 LAB — TYPE AND SCREEN: ABO/RH(D): O POS

## 2024-08-14 LAB — CREATININE, URINE, RANDOM: Creatinine, Urine: 244 mg/dL

## 2024-08-14 LAB — SODIUM, URINE, RANDOM: Sodium, Ur: 48 mmol/L

## 2024-08-14 LAB — TROPONIN T, HIGH SENSITIVITY: Troponin T High Sensitivity: 127 ng/L (ref 0–19)

## 2024-08-14 LAB — HEMOGLOBIN AND HEMATOCRIT, BLOOD
HCT: 38.6 % (ref 36.0–46.0)
Hemoglobin: 12.5 g/dL (ref 12.0–15.0)

## 2024-08-14 MED ORDER — OXYCODONE HCL 5 MG PO TABS
5.0000 mg | ORAL_TABLET | Freq: Four times a day (QID) | ORAL | Status: DC | PRN
  Administered 2024-08-14 – 2024-08-21 (×19): 5 mg via ORAL
  Filled 2024-08-14 (×19): qty 1

## 2024-08-14 MED ORDER — HYDROMORPHONE HCL 1 MG/ML IJ SOLN
0.5000 mg | Freq: Once | INTRAMUSCULAR | Status: AC | PRN
  Administered 2024-08-14: 0.5 mg via INTRAVENOUS
  Filled 2024-08-14: qty 0.5

## 2024-08-14 MED ORDER — LACTATED RINGERS IV SOLN: INTRAVENOUS | Status: DC

## 2024-08-14 MED ORDER — ONDANSETRON HCL 4 MG PO TABS
4.0000 mg | ORAL_TABLET | Freq: Four times a day (QID) | ORAL | Status: DC | PRN
  Administered 2024-08-15: 4 mg via ORAL
  Filled 2024-08-14: qty 1

## 2024-08-14 MED ORDER — SENNOSIDES-DOCUSATE SODIUM 8.6-50 MG PO TABS
1.0000 | ORAL_TABLET | Freq: Every day | ORAL | Status: DC
  Administered 2024-08-14 – 2024-08-21 (×7): 1 via ORAL
  Filled 2024-08-14 (×8): qty 1

## 2024-08-14 MED ORDER — ACETAMINOPHEN 325 MG PO TABS
650.0000 mg | ORAL_TABLET | Freq: Four times a day (QID) | ORAL | Status: DC | PRN
  Administered 2024-08-14 – 2024-08-22 (×15): 650 mg via ORAL
  Filled 2024-08-14 (×16): qty 2

## 2024-08-14 MED ORDER — HYDROMORPHONE HCL 1 MG/ML IJ SOLN
1.0000 mg | INTRAMUSCULAR | Status: DC | PRN
  Administered 2024-08-14 – 2024-08-18 (×18): 1 mg via INTRAVENOUS
  Filled 2024-08-14 (×19): qty 1

## 2024-08-14 MED ORDER — FOLIC ACID 1 MG PO TABS
1.0000 mg | ORAL_TABLET | Freq: Every day | ORAL | Status: DC
  Administered 2024-08-14 – 2024-08-22 (×8): 1 mg via ORAL
  Filled 2024-08-14 (×8): qty 1

## 2024-08-14 MED ORDER — LORAZEPAM 2 MG/ML IJ SOLN
0.0000 mg | Freq: Four times a day (QID) | INTRAMUSCULAR | Status: DC
  Administered 2024-08-14: 2 mg via INTRAVENOUS
  Filled 2024-08-14: qty 1

## 2024-08-14 MED ORDER — HYDROMORPHONE HCL 1 MG/ML IJ SOLN
0.5000 mg | Freq: Once | INTRAMUSCULAR | Status: AC
  Administered 2024-08-14: 0.5 mg via INTRAVENOUS
  Filled 2024-08-14: qty 0.5

## 2024-08-14 MED ORDER — ADULT MULTIVITAMIN W/MINERALS CH
1.0000 | ORAL_TABLET | Freq: Every day | ORAL | Status: DC
  Administered 2024-08-14 – 2024-08-22 (×8): 1 via ORAL
  Filled 2024-08-14 (×9): qty 1

## 2024-08-14 MED ORDER — PANTOPRAZOLE SODIUM 40 MG IV SOLR
40.0000 mg | Freq: Two times a day (BID) | INTRAVENOUS | Status: DC
  Administered 2024-08-14 – 2024-08-18 (×9): 40 mg via INTRAVENOUS
  Filled 2024-08-14 (×9): qty 10

## 2024-08-14 MED ORDER — GUAIFENESIN 100 MG/5ML PO LIQD
5.0000 mL | ORAL | Status: DC | PRN
  Administered 2024-08-14 – 2024-08-21 (×10): 5 mL via ORAL
  Filled 2024-08-14 (×11): qty 10

## 2024-08-14 MED ORDER — ONDANSETRON HCL 4 MG/2ML IJ SOLN: 4.0000 mg | Freq: Four times a day (QID) | INTRAMUSCULAR | Status: DC | PRN

## 2024-08-14 MED ORDER — LORAZEPAM 2 MG/ML IJ SOLN
1.0000 mg | INTRAMUSCULAR | Status: DC | PRN
  Administered 2024-08-15 (×2): 2 mg via INTRAVENOUS
  Filled 2024-08-14 (×2): qty 1

## 2024-08-14 MED ORDER — FLUCONAZOLE IN SODIUM CHLORIDE 400-0.9 MG/200ML-% IV SOLN
400.0000 mg | Freq: Once | INTRAVENOUS | Status: AC
  Administered 2024-08-14: 400 mg via INTRAVENOUS
  Filled 2024-08-14: qty 200

## 2024-08-14 MED ORDER — METOPROLOL SUCCINATE ER 50 MG PO TB24
50.0000 mg | ORAL_TABLET | Freq: Every day | ORAL | Status: DC
  Administered 2024-08-14 – 2024-08-22 (×7): 50 mg via ORAL
  Filled 2024-08-14 (×9): qty 1

## 2024-08-14 MED ORDER — OXYCODONE HCL 5 MG PO TABS
2.5000 mg | ORAL_TABLET | Freq: Once | ORAL | Status: AC | PRN
  Administered 2024-08-14: 2.5 mg via ORAL
  Filled 2024-08-14: qty 1

## 2024-08-14 MED ORDER — LORAZEPAM 1 MG PO TABS
1.0000 mg | ORAL_TABLET | ORAL | Status: DC | PRN
  Administered 2024-08-14: 2 mg via ORAL
  Administered 2024-08-14: 1 mg via ORAL
  Administered 2024-08-14 (×2): 2 mg via ORAL
  Filled 2024-08-14: qty 1
  Filled 2024-08-14 (×2): qty 2
  Filled 2024-08-14: qty 1

## 2024-08-14 MED ORDER — THIAMINE HCL 100 MG/ML IJ SOLN
100.0000 mg | Freq: Every day | INTRAMUSCULAR | Status: DC
  Administered 2024-08-15: 100 mg via INTRAVENOUS
  Filled 2024-08-14 (×3): qty 2

## 2024-08-14 MED ORDER — SODIUM CHLORIDE 0.9% FLUSH
3.0000 mL | Freq: Two times a day (BID) | INTRAVENOUS | Status: DC
  Administered 2024-08-14 – 2024-08-21 (×12): 3 mL via INTRAVENOUS

## 2024-08-14 MED ORDER — FLUCONAZOLE IN SODIUM CHLORIDE 200-0.9 MG/100ML-% IV SOLN
200.0000 mg | INTRAVENOUS | Status: DC
  Administered 2024-08-15: 200 mg via INTRAVENOUS
  Filled 2024-08-14: qty 100

## 2024-08-14 MED ORDER — SODIUM CHLORIDE 0.9 % IV BOLUS
1000.0000 mL | Freq: Once | INTRAVENOUS | Status: AC
  Administered 2024-08-14: 1000 mL via INTRAVENOUS

## 2024-08-14 MED ORDER — THIAMINE MONONITRATE 100 MG PO TABS
100.0000 mg | ORAL_TABLET | Freq: Every day | ORAL | Status: DC
  Administered 2024-08-14 – 2024-08-22 (×8): 100 mg via ORAL
  Filled 2024-08-14 (×9): qty 1

## 2024-08-14 MED ORDER — POLYETHYLENE GLYCOL 3350 17 G PO PACK
17.0000 g | PACK | Freq: Every day | ORAL | Status: DC | PRN
  Administered 2024-08-18 – 2024-08-21 (×2): 17 g via ORAL
  Filled 2024-08-14 (×3): qty 1

## 2024-08-14 MED ORDER — LORAZEPAM 2 MG/ML IJ SOLN: 0.0000 mg | Freq: Two times a day (BID) | INTRAMUSCULAR | Status: DC

## 2024-08-14 MED ORDER — MELATONIN 3 MG PO TABS
3.0000 mg | ORAL_TABLET | Freq: Every evening | ORAL | Status: DC | PRN
  Administered 2024-08-14 – 2024-08-21 (×9): 3 mg via ORAL
  Filled 2024-08-14 (×9): qty 1

## 2024-08-14 MED ORDER — ACETAMINOPHEN 650 MG RE SUPP: 650.0000 mg | Freq: Four times a day (QID) | RECTAL | Status: DC | PRN

## 2024-08-14 MED ORDER — LORAZEPAM 2 MG/ML IJ SOLN
2.0000 mg | Freq: Once | INTRAMUSCULAR | Status: AC
  Administered 2024-08-14: 2 mg via INTRAVENOUS
  Filled 2024-08-14: qty 1

## 2024-08-14 NOTE — Consult Note (Signed)
 "  Cardiology Consultation   [name] MRN: [id number]; DOB: 07/19/1961  Admit date: 08/13/2024 Date of Consult: 08/14/2024  PCP:  Delbert Clam, MD   [facility] Providers Cardiologist:  None        Patient Profile: Jennifer [name] is a 64 y.o. female with a hx of HTN and tobacco use disorder who is being seen 08/14/2024 for the evaluation of elevated troponins at the request of Dr. Sebastian.  History of Present Illness: [name] was recently hospitalized from 1/2-2 1/3 for melenic stools.  The patient underwent an upper endoscopy which revealed esophageal plaques concerning for candidiasis, and nonbleeding gastric ulcer, and mucosal changes in the duodenum.  Before additional evaluation could be performed the patient left AMA because she had to attend to a child at home.  She ultimately returned back to the ED early this morning with ongoing melenic stools and lower abdominal pain.  At some point during her admission she had a brief episode of chest tightness.  Troponins were checked and were mildly elevated but flat 121 -> 127.  She has not had any additional chest tightness since this isolated episode and denies having any chest pain prior to this or since.  EKG demonstrates marked QRS complexes with diffuse T wave inversions, but this appears unchanged from even her most remote EKG.  She has no additional complaints apart from her abdominal discomfort.  Cardiology is asked to evaluate.   Past Medical History:  Diagnosis Date   bipolar    Bipolar 1 disorder (HCC)    Depression    Fibroids    now s/p hysterectomy   Hypertension    Left ventricular hypertrophy    Polysubstance abuse (HCC)    IV drug us , cocaine on occassion, smoking and alcoholism, has been going to AA, not completely sober    Past Surgical History:  Procedure Laterality Date   ABDOMINAL HYSTERECTOMY     2003   BILATERAL SALPINGOOPHORECTOMY     01/2010   DILATION AND CURETTAGE OF  UTERUS     EXPLORATORY LAPAROTOMY     complex pelvic mass 2011   TONSILLECTOMY       Home Medications:  Prior to Admission medications  Medication Sig Start Date End Date Taking? Authorizing Provider  amLODipine  (NORVASC ) 10 MG tablet TAKE 1 TABLET (10 MG TOTAL) BY MOUTH DAILY (Office visit for future refills) 07/14/24   Newlin, Enobong, MD  atorvastatin  (LIPITOR) 10 MG tablet Take 1 tablet (10 mg total) by mouth daily. 07/14/24   Newlin, Enobong, MD  Blood Pressure Monitoring (BLOOD PRESSURE CUFF) MISC 1 Units by Does not apply route daily. Patient not taking: No sig reported 05/24/24   Mayers, Cari S, PA-C  esomeprazole  (NEXIUM ) 40 MG capsule Take 1 capsule (40 mg total) by mouth daily. Patient not taking: Reported on 08/12/2024 07/14/24   Newlin, Enobong, MD  lisinopril -hydrochlorothiazide  (ZESTORETIC ) 20-12.5 MG tablet Take 2 tablets by mouth daily. 07/14/24   Newlin, Enobong, MD  metoprolol  succinate (TOPROL -XL) 50 MG 24 hr tablet Take 1 tablet (50 mg total) by mouth daily. 07/14/24   Newlin, Enobong, MD    Scheduled Meds:  folic acid   1 mg Oral Daily   LORazepam   0-4 mg Intravenous Q6H   Followed by   NOREEN ON 08/16/2024] LORazepam   0-4 mg Intravenous Q12H   multivitamin with minerals  1 tablet Oral Daily   pantoprazole  (PROTONIX ) IV  40 mg Intravenous Q12H   senna-docusate  1  tablet Oral QHS   sodium chloride  flush  3 mL Intravenous Q12H   thiamine   100 mg Oral Daily   Or   thiamine   100 mg Intravenous Daily   Continuous Infusions:  [START ON 08/15/2024] fluconazole  (DIFLUCAN ) IV     lactated ringers  125 mL/hr at 08/14/24 0838   PRN Meds: acetaminophen  **OR** acetaminophen , LORazepam  **OR** LORazepam , melatonin, ondansetron  **OR** ondansetron  (ZOFRAN ) IV, oxyCODONE , polyethylene glycol  Allergies:   Allergies[1]  Social History:   Social History   Socioeconomic History   Marital status: Single    Spouse name: Not on file   Number of children: 0   Years of education: Not  on file   Highest education level: Some college, no degree  Occupational History   Not on file  Tobacco Use   Smoking status: Every Day    Current packs/day: 1.00    Average packs/day: 1 pack/day for 20.0 years (20.0 ttl pk-yrs)    Types: Cigarettes   Smokeless tobacco: Never   Tobacco comments:    Wants to quit.   Vaping Use   Vaping status: Never Used  Substance and Sexual Activity   Alcohol use: Yes    Comment: occ.   Drug use: Not Currently    Types: Cocaine, Marijuana   Sexual activity: Not Currently    Birth control/protection: Surgical  Other Topics Concern   Not on file  Social History Narrative   Financial assistance approved for 100% discount at [facility] and has [facility] card per Barnie Potters   07/19/2010         Social Drivers of Health   Tobacco Use: High Risk (08/13/2024)   Patient History    Smoking Tobacco Use: Every Day    Smokeless Tobacco Use: Never    Passive Exposure: Not on file  Financial Resource Strain: Not on file  Food Insecurity: No Food Insecurity (08/14/2024)   Epic    Worried About Programme Researcher, Broadcasting/film/video in the Last Year: Never true    Ran Out of Food in the Last Year: Never true  Transportation Needs: Unmet Transportation Needs (08/14/2024)   Epic    Lack of Transportation (Medical): Yes    Lack of Transportation (Non-Medical): Yes  Physical Activity: Not on file  Stress: Not on file  Social Connections: Not on file  Intimate Partner Violence: Not At Risk (08/14/2024)   Epic    Fear of Current or Ex-Partner: No    Emotionally Abused: No    Physically Abused: No    Sexually Abused: No  Depression (PHQ2-9): Low Risk (05/24/2024)   Depression (PHQ2-9)    PHQ-2 Score: 1  Recent Concern: Depression (PHQ2-9) - Medium Risk (03/04/2024)   Depression (PHQ2-9)    PHQ-2 Score: 6  Alcohol Screen: Not on file  Housing: Low Risk (08/14/2024)   Epic    Unable to Pay for Housing in the Last Year: No    Number of Times Moved in the Last Year: 0    Homeless in  the Last Year: No  Utilities: Not At Risk (08/14/2024)   Epic    Threatened with loss of utilities: No  Health Literacy: Not on file    Family History:    Family History  Problem Relation Age of Onset   Hypertension Mother    Hypertension Sister    Diabetes Father      ROS:  Please see the history of present illness.  All other ROS reviewed and negative.     Physical  Exam/Data: Vitals:   08/14/24 0238 08/14/24 0703 08/14/24 0800 08/14/24 0838  BP: 137/78  (!) 105/56 (!) 105/56  Pulse: 73  89 87  Resp: 18   20  Temp: 98.7 F (37.1 C)   97.9 F (36.6 C)  TempSrc: Oral   Oral  SpO2: 95%   93%  Weight:  81.8 kg    Height:  5' 4 (1.626 m)      Intake/Output Summary (Last 24 hours) at 08/14/2024 0857 Last data filed at 08/14/2024 0300 Gross per 24 hour  Intake 1489.9 ml  Output --  Net 1489.9 ml      08/14/2024    7:03 AM 08/12/2024    8:05 AM 07/14/2024    9:22 AM  Last 3 Weights  Weight (lbs) 180 lb 5.4 oz 179 lb 14.3 oz 180 lb  Weight (kg) 81.8 kg 81.6 kg 81.647 kg     Body mass index is 30.95 kg/m.  General:  Well nourished, well developed, in no acute distress but appears mildly uncomfortable from pain HEENT: normal Neck: no JVD Vascular: No carotid bruits; Distal pulses 2+ bilaterally Cardiac:  normal S1, S2; RRR; II/VI systolic murmur at left sternal border, no rubs or gallops Lungs:  clear to auscultation bilaterally, no wheezing, rhonchi or rales  Abd: soft and nondistended, tenderness to palpation of the lower abdomen Ext: no edema Musculoskeletal:  No deformities, BUE and BLE strength normal and equal Skin: warm and dry  Neuro:  CNs 2-12 intact, no focal abnormalities noted Psych:  Normal affect   EKG:  The EKG was personally reviewed and demonstrates: NSR, marked LVH with repolarization abnormalities Telemetry:  Telemetry was personally reviewed and demonstrates: NSR  Relevant CV Studies: N/A  Laboratory Data: High Sensitivity Troponin:  No results  for input(s): TROPONINIHS in the last 720 hours.  Recent Labs  Lab 08/13/24 2307 08/14/24 0152  TRNPT 121* 127*      Chemistry Recent Labs  Lab 08/13/24 0519 08/13/24 2025 08/14/24 0431  NA 140 143 141  K 3.9 4.2 4.4  CL 104 104 108  CO2 24 26 22   GLUCOSE 104* 107* 115*  BUN 38* 41* 40*  CREATININE 1.63* 2.66* 1.74*  CALCIUM  10.2 10.7* 9.6  MG  --   --  2.4  GFRNONAA 35* 20* 33*  ANIONGAP 12 13 12     Recent Labs  Lab 08/12/24 0814 08/13/24 2025  PROT 7.8 8.0  ALBUMIN 4.3 4.3  AST 21 37  ALT 17 20  ALKPHOS 63 60  BILITOT 0.6 0.4   Lipids No results for input(s): CHOL, TRIG, HDL, LABVLDL, LDLCALC, CHOLHDL in the last 168 hours.  Hematology Recent Labs  Lab 08/13/24 0519 08/13/24 2025 08/14/24 0431  WBC 7.6 10.8* 8.1  RBC 4.78 4.87 4.34  HGB 13.8 14.4 12.6  HCT 42.5 44.0 38.8  MCV 88.9 90.3 89.4  MCH 28.9 29.6 29.0  MCHC 32.5 32.7 32.5  RDW 14.6 14.7 14.4  PLT 195 209 164   Thyroid No results for input(s): TSH, FREET4 in the last 168 hours.  BNPNo results for input(s): BNP, PROBNP in the last 168 hours.  DDimer No results for input(s): DDIMER in the last 168 hours.  Radiology/Studies:  US  RENAL Result Date: 08/14/2024 EXAM: US  Retroperitoneum Complete, Renal. 08/14/2024 07:14:38 AM TECHNIQUE: Real-time ultrasonography of the retroperitoneum renal was performed. COMPARISON: CT 08/13/2024 CLINICAL HISTORY: 409830 AKI (acute kidney injury) 409830 AKI (acute kidney injury) FINDINGS: FINDINGS: RIGHT KIDNEY/URETER: The right kidney measures 11.2 x  5.3 x 6.1 cm with a volume of 187.6 cc. Normal cortical echogenicity. No hydronephrosis. Inferior pole right kidney stone measures 5 mm. No mass. LEFT KIDNEY/URETER: Left kidney measures 11.0 x 5.3 x 5.9 cm with a volume of 180.4 cc. Normal cortical echogenicity. No hydronephrosis. No calculus. No mass. BLADDER: Urinary bladder is decompressed. Patient voided prior to exam. IMPRESSION: 1. No acute  findings. 2. Inferior pole right kidney stone measuring 5 mm. Electronically signed by: Waddell Calk MD 08/14/2024 07:21 AM EST RP Workstation: [id number]   CT CHEST ABDOMEN PELVIS WO CONTRAST Result Date: 08/13/2024 EXAM: CT CHEST, ABDOMEN AND PELVIS WITHOUT CONTRAST 08/13/2024 11:46:00 PM TECHNIQUE: CT of the chest, abdomen and pelvis was performed without the administration of intravenous contrast. Multiplanar reformatted images are provided for review. Automated exposure control, iterative reconstruction, and/or weight based adjustment of the mA/kV was utilized to reduce the radiation dose to as low as reasonably achievable. COMPARISON: Chest radiograph 08/13/2024, CT abdomen and pelvis 08/12/2024, and CT chest 08/10/2023. CLINICAL HISTORY: Abdominal pain, acute, nonlocalized; post EGD and has severe pain. FINDINGS: CHEST: MEDIASTINUM AND LYMPH NODES: Heart and pericardium are unremarkable. The central airways are clear. No mediastinal, hilar or axillary lymphadenopathy. Diffusely dilated esophagus filled with air and fluid. This is similar to the prior studies and likely represents chronic dysmotility such as achalasia. LUNGS AND PLEURA: 4 mm right lower lung nodule, series 4 image 159. No change since prior study. No imaging follow-up is indicated by size criteria. No focal consolidation or pulmonary edema. No pleural effusion. No pneumothorax. ABDOMEN AND PELVIS: LIVER: Unremarkable. GALLBLADDER AND BILE DUCTS: Increased density throughout the gallbladder likely representing vicarious contrast excretion. No bile duct dilatation. SPLEEN: No acute abnormality. PANCREAS: No acute abnormality. ADRENAL GLANDS: No acute abnormality. KIDNEYS, URETERS AND BLADDER: Right lower pole renal stone measuring 3 mm in diameter. No hydronephrosis or hydroureter. No perinephric or periureteral stranding. Residual contrast material in the bladder. No bladder wall thickening. GI AND BOWEL: The stomach is partially  decompressed, but there is evidence of gastric wall thickening in the distal stomach, similar to prior study. Residual contrast material demonstrated in the colon. The small bowel and colon are decompressed. The appendix is normal. There is no bowel obstruction. REPRODUCTIVE ORGANS: The uterus appears surgically absent. No abnormal adnexal masses. PERITONEUM AND RETROPERITONEUM: No free air or free fluid in the abdomen or pelvis to suggest perforation. VASCULATURE: Calcification of the aorta. Aorta is normal in caliber. ABDOMINAL AND PELVIS LYMPH NODES: No lymphadenopathy. BONES AND SOFT TISSUES: Mild degenerative changes in the spine. Slight anterior subluxation of L4 on L5, likely degenerative. No change since prior study. No acute osseous abnormality. No focal soft tissue abnormality. IMPRESSION: 1. No acute findings. No evidence of bowel perforation . 2. Gastric wall thickening in the distal stomach, similar to prior study. 3. Esophageal dilatation suggests chronic esophageal dysmotility such as achalasia, similar to prior studies. 4. Nonobstructing 3 mm right lower pole renal stone. No hydronephrosis or hydroureter. Electronically signed by: Elsie Gravely MD 08/13/2024 11:55 PM EST RP Workstation: [id number]   DG Chest Port 1 View Result Date: 08/13/2024 EXAM: 1 VIEW(S) XRAY OF THE CHEST 08/13/2024 09:03:00 PM COMPARISON: CT chest 08/10/2023. CLINICAL HISTORY: Abdominal pain. FINDINGS: LUNGS AND PLEURA: No focal pulmonary opacity. No vascular congestion, edema, or consolidation. No pleural effusion. No pneumothorax. HEART AND MEDIASTINUM: Mild cardiac enlargement. Mediastinal contours appear intact. Calcification of the aorta. BONES AND SOFT TISSUES: No acute osseous abnormality. IMPRESSION: 1. No acute cardiopulmonary findings. Electronically  signed by: Elsie Gravely MD 08/13/2024 09:06 PM EST RP Workstation: [id number]   US  Abdomen Limited RUQ (LIVER/GB) Result Date: 08/12/2024 CLINICAL DATA:  Right  upper quadrant abdominal pain EXAM: ULTRASOUND ABDOMEN LIMITED RIGHT UPPER QUADRANT COMPARISON:  June 12, 2016 FINDINGS: Gallbladder: No gallstones or wall thickening visualized. No sonographic Murphy sign noted by sonographer. Common bile duct: Diameter: 2 mm which is within normal limits. Liver: No focal lesion identified. Within normal limits in parenchymal echogenicity. Portal vein is patent on color Doppler imaging with normal direction of blood flow towards the liver. Other: None. IMPRESSION: No definite abnormality seen in the right upper quadrant of the abdomen. Electronically Signed   By: Lynwood Landy Raddle M.D.   On: 08/12/2024 10:27   CT ABDOMEN PELVIS W CONTRAST Result Date: 08/12/2024 CLINICAL DATA:  Acute generalized abdominal pain EXAM: CT ABDOMEN AND PELVIS WITH CONTRAST TECHNIQUE: Multidetector CT imaging of the abdomen and pelvis was performed using the standard protocol following bolus administration of intravenous contrast. RADIATION DOSE REDUCTION: This exam was performed according to the departmental dose-optimization program which includes automated exposure control, adjustment of the mA and/or kV according to patient size and/or use of iterative reconstruction technique. CONTRAST:  OMNIPAQUE  IOHEXOL  300 MG/ML  SOLN COMPARISON:  February 15, 2018 FINDINGS: Lower chest: Visualized lung bases are unremarkable. Moderately dilated esophagus is noted with moderate wall thickening suggesting esophagitis. Hepatobiliary: No focal liver abnormality is seen. No gallstones, gallbladder wall thickening, or biliary dilatation. Pancreas: Unremarkable. No pancreatic ductal dilatation or surrounding inflammatory changes. Spleen: Normal in size without focal abnormality. Adrenals/Urinary Tract: Adrenal glands are unremarkable. Kidneys are normal, without renal calculi, focal lesion, or hydronephrosis. Bladder is unremarkable. Stomach/Bowel: Moderate wall thickening of distal stomach is noted concerning for  gastritis or peptic ulcer disease. Possible ulceration is noted along lesser curvature. Endoscopy is recommended. There is no evidence of bowel obstruction. Appendix is unremarkable. Vascular/Lymphatic: Aortic atherosclerosis. No enlarged abdominal or pelvic lymph nodes. Reproductive: Status post hysterectomy. No adnexal masses. Other: No abdominal wall hernia or abnormality. No abdominopelvic ascites. Musculoskeletal: No acute or significant osseous findings. IMPRESSION: 1. Moderate wall thickening of distal stomach is noted concerning for gastritis or peptic ulcer disease. Possible ulceration is noted along lesser curvature. Endoscopy is recommended. 2. Moderately dilated esophagus is noted with moderate wall thickening suggesting esophagitis. 3. Aortic atherosclerosis. Aortic Atherosclerosis (ICD10-I70.0). Electronically Signed   By: Lynwood Landy Raddle M.D.   On: 08/12/2024 10:02     Assessment and Plan:  ANHELICA FOWERS is a 64 y.o. female with a hx of HTN, HLD and tobacco use disorder who is being seen 08/14/2024 for the evaluation of elevated troponins at the request of Dr. Sebastian.  #Myocardial Injury #Suspected LVH #Non-Cardiac Chest Pain - Patient had a brief episode of chest tightness that was fleeting. - Troponins that were obtained are elevated but flat representing myocardial injury which is likely due to her suspected LVH vs bleeding. - EKG demonstrates marked LVH with repolarization abnormalities. - Will obtain an echocardiogram to further evaluate, but her chest pain is noncardiac. Complete echocardiogram  #HTN #HLD Hold home amlodipine  in the setting of active GI bleed Hold home metoprolol  in the setting of active GI bleed Fine to continue atorvastatin  10 mg daily   Risk Assessment/Risk Scores:              For questions or updates, please contact  [facility] Please consult [email or web address] for contact info under      Signed,  Georganna Archer, MD  08/14/2024  8:57 AM      [1] No Known Allergies  "

## 2024-08-14 NOTE — Progress Notes (Addendum)
 Was called by RN that patient woke up suddenly in significant pain, noted to be tachypneic, tachycardic with increased O2 requirements. -Came and assessed patient and patient with complaints of significant diffuse abdominal pain. General: NAD Respiratory: Shallow breaths, clear to auscultation, no wheezes, no crackles, no rhonchi. Cardiovascular: Tachycardia, no murmurs rubs or gallops.  No JVD.  No lower extremity edema. GI: Abdomen is soft, diffusely tender to palpation, positive bowel sounds, no rebound, no guarding. Extremities: No clubbing cyanosis or edema  Assessment and plan #1 abdominal pain Likely secondary to gastric ulcer noted on recent EGD. Patient with no melanotic stools. Hemoglobin down this afternoon stable. Patient noted to have received 5 mg of IV Dilaudid  at 1800, noted to also have received 2 mg of Ativan  at 1804. -Will give IV Dilaudid  0.5 mg x 1 now. -Would not escalate pain medications any further. -Follow.  No charge.

## 2024-08-14 NOTE — Progress Notes (Addendum)
 " PROGRESS NOTE    Jennifer [name]  FMW:[id number] DOB: 11/22/1960 DOA: 08/13/2024 PCP: Delbert Clam, MD    Chief Complaint  Patient presents with   Melena    Brief Narrative:  LLUVIA [name] is a 64 y.o. female with medical history significant for HTN, BPD, substance use disorder, tobacco use who presented to the ED for evaluation of abdominal pain and black stools.   Patient was initially admitted on 08/12/2024 with melena secondary to upper GI bleeding.  Hemoglobin was 14.1.  She was seen by GI and underwent EGD morning of 08/13/2024.  This showed esophageal plaques suspicious for candidiasis, nonbleeding gastric ulcer with pigmented material, and gastritis.  Patient ultimately left AMA after the EGD.  It was noted that creatinine worsened from 1.19 to 1.63 on repeat morning labs.  On repeat labs in the ED creatinine noted to go up as high as 2.66.   Assessment & Plan:   Principal Problem:   AKI (acute kidney injury) Active Problems:   Acute upper GI bleeding   Essential hypertension   Esophageal candidiasis (HCC)   Gastric ulcer   Elevated troponin  #1 melena/upper GI bleed secondary to gastric ulcer/gastritis -Patient admitted on 08/12/2024 with concerns for upper GI bleed. - Patient subsequently underwent EGD 08/13/2024 that showed esophageal plaques suspicious for candidiasis, large nonbleeding gastric ulcer with pigmented material and gastritis. - Patient noted to be on Aleve  nightly. - Hemoglobin noted at 14.4 on admission down to 12.6 today. - Continue IV PPI every 12 hours. - Patient with significant epigastric abdominal pain and diffuse abdominal pain, noted to have presented with ongoing melena.  BP soft. -- CT chest abdomen and pelvis done on admission with no acute findings, no evidence of bowel perforation, gastric wall thickening in the distal stomach similar to prior study.  Esophageal dilation suggest chronic esophageal dysmotility such as achalasia, similar to  prior studies.  Nonobstructing 3 mm right lower pole renal stone.  No hydronephrosis or hydroureter. - Check stat abdominal films to rule out perforation. - GI initially recommending CT angiogram abdomen stat and if bleeding will need IR for possible embolization. - Will make patient n.p.o. pending CT angiogram results. -Patient advised to avoid NSAIDs. -Serial CBC. - GI consulted.  Addendum.  Per GI hold off on CT angiogram abdomen and repeat CBC this afternoon if worsening hemoglobin, worsening hemodynamics and ongoing melena are recommending CT angiogram abdomen at that time.  2.  Esophageal candidiasis -EGD suspicious for esophageal candidiasis. - Pathology pending. - Patient on IV Diflucan  200 mg daily, renal dosing once renal function normalizes will need to increase to IV Diflucan  400 mg daily and completed 2-week course of treatment. - Will make patient n.p.o. pending CT angiogram abdomen. - GI consulted.  3.  Acute kidney injury -Likely secondary to a prerenal azotemia in the setting of melanotic stools, decreased p.o. intake, NSAID use and patient noted to be on lisinopril  HCTZ prior to admission. - Creatinine on admission noted at 2.66 with prior creatinine of 1.19 on 08/13/2024. - Urine studies pending. - Renal ultrasound with no acute findings, inferior pole right kidney stone measuring 5 mm. - Creatinine trending down with hydration currently at 1.74 from 2.66 on admission. - Patient for CT angiogram abdomen stat due to concerns for GI bleed and as such we will give her 1 L bolus of IV fluids and continue IV fluids with normal saline at 125 cc an hour. - Monitor urine output. - Follow-up.  4.  Elevated troponin -Troponins noted elevated at 121>>> 127. - Patient presented with complaints of chest pain but when asked to clarify and noted to be pointing more in the upper abdominal region and diffusely. - CT chest abdomen and pelvis done on admission with no acute findings, no  evidence of bowel perforation, gastric wall thickening in the distal stomach similar to prior study.  Esophageal dilation suggest chronic esophageal dysmotility such as achalasia, similar to prior studies.  Nonobstructing 3 mm right lower pole renal stone.  No hydronephrosis or hydroureter. - EKG done with diffuse T wave inversions, nonspecific ST depressions unchanged from prior EKG. - Likely elevated troponin secondary to demand ischemia in the setting of GI bleed, dehydration and AKI. - Check a 2D echo. - Cardiology consulted.  5.  Hypertension -Patient with soft blood pressures. - Continue to hold antihypertensive medications.  6.  History of substance use disorder/history of alcohol use -Patient reported occasional alcohol use, last use about 5 days prior to admission. - Patient also with history of recreational drug use but denied any recent use. - UDS pending. - Placed on the Ativan  withdrawal protocol.  7.  Tobacco use -Tobacco cessation - Patient noted on admission to declined nicotine patch.   DVT prophylaxis: SCDs Code Status: Full Family Communication: Updated patient.  No family at bedside. Disposition: Likely home when clinically improved and cleared by gastroenterology and cardiology.    Status is: Observation The patient remains OBS appropriate and will d/c before 2 midnights.   Consultants:  Gastroenterology: Dr. Dianna 08/14/2024 Cardiology: Dr. Leafy 08/14/2024  Procedures:  Chest x-ray 08/13/2024 Abdominal films 08/14/2024 Renal ultrasound 08/14/2024  Antimicrobials:  Anti-infectives (From admission, onward)    Start     Dose/Rate Route Frequency Ordered Stop   08/15/24 0500  fluconazole  (DIFLUCAN ) IVPB 200 mg        200 mg 100 mL/hr over 60 Minutes Intravenous Every 24 hours 08/14/24 0128     08/14/24 0200  fluconazole  (DIFLUCAN ) IVPB 400 mg        400 mg 100 mL/hr over 120 Minutes Intravenous  Once 08/14/24 0144 08/14/24 0416          Subjective: Patient laying in bed.  Patient with complaints of diffuse abdominal pain, chest pain and pain all over.  States oxycodone  is not managing the pain at this time.  Noted to have melanotic stools yesterday after she left AMA.  No bowel movement today.  No nausea or vomiting.  In agreement to staying throughout this hospitalization.  Objective: Vitals:   08/14/24 0238 08/14/24 0703 08/14/24 0800 08/14/24 0838  BP: 137/78  (!) 105/56 (!) 105/56  Pulse: 73  89 87  Resp: 18   20  Temp: 98.7 F (37.1 C)   97.9 F (36.6 C)  TempSrc: Oral   Oral  SpO2: 95%   93%  Weight:  81.8 kg    Height:  5' 4 (1.626 m)      Intake/Output Summary (Last 24 hours) at 08/14/2024 0940 Last data filed at 08/14/2024 0300 Gross per 24 hour  Intake 1489.9 ml  Output --  Net 1489.9 ml   Filed Weights   08/14/24 0703  Weight: 81.8 kg    Examination:  General exam: Appears calm and comfortable  Respiratory system: Clear to auscultation.  Tachypnea.  Some splinting.  Speaking in full sentences.  No use of accessory muscles of respiration.  Respiratory effort normal. Cardiovascular system: S1 & S2 heard, RRR. No JVD, murmurs, rubs, gallops  or clicks. No pedal edema. Gastrointestinal system: Abdomen is nondistended, soft and exquisitely tender to palpation in the epigastric region and tender to palpation diffusely.  Some guarding.  No rebound.  Positive bowel sounds. Central nervous system: Alert and oriented. No focal neurological deficits. Extremities: Symmetric 5 x 5 power. Skin: No rashes, lesions or ulcers Psychiatry: Judgement and insight appear normal. Mood & affect appropriate.     Data Reviewed: I have personally reviewed following labs and imaging studies  CBC: Recent Labs  Lab 08/12/24 0814 08/12/24 1838 08/13/24 0519 08/13/24 2025 08/14/24 0431  WBC 7.9  --  7.6 10.8* 8.1  NEUTROABS  --   --   --  8.5*  --   HGB 15.7* 14.1 13.8 14.4 12.6  HCT 47.4* 43.1 42.5 44.0 38.8   MCV 89.1  --  88.9 90.3 89.4  PLT 246  --  195 209 164    Basic Metabolic Panel: Recent Labs  Lab 08/12/24 0814 08/13/24 0519 08/13/24 2025 08/14/24 0431  NA 140 140 143 141  K 3.7 3.9 4.2 4.4  CL 104 104 104 108  CO2 24 24 26 22   GLUCOSE 116* 104* 107* 115*  BUN 29* 38* 41* 40*  CREATININE 1.19* 1.63* 2.66* 1.74*  CALCIUM  10.7* 10.2 10.7* 9.6  MG  --   --   --  2.4    GFR: Estimated Creatinine Clearance: 35.1 mL/min (A) (by C-G formula based on SCr of 1.74 mg/dL (H)).  Liver Function Tests: Recent Labs  Lab 08/12/24 0814 08/13/24 2025  AST 21 37  ALT 17 20  ALKPHOS 63 60  BILITOT 0.6 0.4  PROT 7.8 8.0  ALBUMIN 4.3 4.3    CBG: Recent Labs  Lab 08/12/24 0808  GLUCAP 110*     Recent Results (from the past 240 hours)  Surgical PCR screen     Status: None   Collection Time: 08/13/24  3:31 AM   Specimen: Nasal Mucosa; Nasal Swab  Result Value Ref Range Status   MRSA, PCR NEGATIVE NEGATIVE Final   Staphylococcus aureus NEGATIVE NEGATIVE Final    Comment: (NOTE) The Xpert SA Assay (FDA approved for NASAL specimens in patients 45 years of age and older), is one component of a comprehensive surveillance program. It is not intended to diagnose infection nor to guide or monitor treatment. Performed at [facility], [address] [place], [place] [address]          Radiology Studies: DG Abd Portable 1V Result Date: 08/14/2024 EXAM: 1 VIEW XRAY OF THE ABDOMEN 08/14/2024 09:11:00 AM COMPARISON: CT chest abdomen and pelvis 08/13/2024. CLINICAL HISTORY: Abdominal pain FINDINGS: BOWEL: There is a paucity of bowel gas within the abdomen and pelvis, which limits assessment for underlying bowel pathology, specifically bowel obstruction. Within this limitation, no dilated bowel loops are noted. The paucity of bowel gas could represent a non-obstructive process, early or incomplete obstruction not yet causing significant dilation, or reduced bowel  motility. SOFT TISSUES: A previous 3 mm right lower pole renal calculus is not currently seen. BONES: No acute fracture. IMPRESSION: 1. Paucity of bowel gas limits assessment for bowel obstruction, with no dilated bowel loops identified. 2. A previous 3 mm right lower pole renal calculus is not currently seen. This could indicate it has passed, is too small to be visualized on plain film, or is obscured. If there is a clinical concern for ureteral calculi, particularly if the patient has renal colic, consider further evaluation with a renal  stone protocol CT. Electronically signed by: Waddell Calk MD 08/14/2024 09:18 AM EST RP Workstation: [id number]   US  RENAL Result Date: 08/14/2024 EXAM: US  Retroperitoneum Complete, Renal. 08/14/2024 07:14:38 AM TECHNIQUE: Real-time ultrasonography of the retroperitoneum renal was performed. COMPARISON: CT 08/13/2024 CLINICAL HISTORY: [id number] AKI (acute kidney injury) 409830 AKI (acute kidney injury) FINDINGS: FINDINGS: RIGHT KIDNEY/URETER: The right kidney measures 11.2 x 5.3 x 6.1 cm with a volume of 187.6 cc. Normal cortical echogenicity. No hydronephrosis. Inferior pole right kidney stone measures 5 mm. No mass. LEFT KIDNEY/URETER: Left kidney measures 11.0 x 5.3 x 5.9 cm with a volume of 180.4 cc. Normal cortical echogenicity. No hydronephrosis. No calculus. No mass. BLADDER: Urinary bladder is decompressed. Patient voided prior to exam. IMPRESSION: 1. No acute findings. 2. Inferior pole right kidney stone measuring 5 mm. Electronically signed by: Waddell Calk MD 08/14/2024 07:21 AM EST RP Workstation: [id number]   CT CHEST ABDOMEN PELVIS WO CONTRAST Result Date: 08/13/2024 EXAM: CT CHEST, ABDOMEN AND PELVIS WITHOUT CONTRAST 08/13/2024 11:46:00 PM TECHNIQUE: CT of the chest, abdomen and pelvis was performed without the administration of intravenous contrast. Multiplanar reformatted images are provided for review. Automated exposure control, iterative reconstruction,  and/or weight based adjustment of the mA/kV was utilized to reduce the radiation dose to as low as reasonably achievable. COMPARISON: Chest radiograph 08/13/2024, CT abdomen and pelvis 08/12/2024, and CT chest 08/10/2023. CLINICAL HISTORY: Abdominal pain, acute, nonlocalized; post EGD and has severe pain. FINDINGS: CHEST: MEDIASTINUM AND LYMPH NODES: Heart and pericardium are unremarkable. The central airways are clear. No mediastinal, hilar or axillary lymphadenopathy. Diffusely dilated esophagus filled with air and fluid. This is similar to the prior studies and likely represents chronic dysmotility such as achalasia. LUNGS AND PLEURA: 4 mm right lower lung nodule, series 4 image 159. No change since prior study. No imaging follow-up is indicated by size criteria. No focal consolidation or pulmonary edema. No pleural effusion. No pneumothorax. ABDOMEN AND PELVIS: LIVER: Unremarkable. GALLBLADDER AND BILE DUCTS: Increased density throughout the gallbladder likely representing vicarious contrast excretion. No bile duct dilatation. SPLEEN: No acute abnormality. PANCREAS: No acute abnormality. ADRENAL GLANDS: No acute abnormality. KIDNEYS, URETERS AND BLADDER: Right lower pole renal stone measuring 3 mm in diameter. No hydronephrosis or hydroureter. No perinephric or periureteral stranding. Residual contrast material in the bladder. No bladder wall thickening. GI AND BOWEL: The stomach is partially decompressed, but there is evidence of gastric wall thickening in the distal stomach, similar to prior study. Residual contrast material demonstrated in the colon. The small bowel and colon are decompressed. The appendix is normal. There is no bowel obstruction. REPRODUCTIVE ORGANS: The uterus appears surgically absent. No abnormal adnexal masses. PERITONEUM AND RETROPERITONEUM: No free air or free fluid in the abdomen or pelvis to suggest perforation. VASCULATURE: Calcification of the aorta. Aorta is normal in caliber.  ABDOMINAL AND PELVIS LYMPH NODES: No lymphadenopathy. BONES AND SOFT TISSUES: Mild degenerative changes in the spine. Slight anterior subluxation of L4 on L5, likely degenerative. No change since prior study. No acute osseous abnormality. No focal soft tissue abnormality. IMPRESSION: 1. No acute findings. No evidence of bowel perforation . 2. Gastric wall thickening in the distal stomach, similar to prior study. 3. Esophageal dilatation suggests chronic esophageal dysmotility such as achalasia, similar to prior studies. 4. Nonobstructing 3 mm right lower pole renal stone. No hydronephrosis or hydroureter. Electronically signed by: Elsie Gravely MD 08/13/2024 11:55 PM EST RP Workstation: [id number]   DG Chest Port 1 View Result Date: 08/13/2024 EXAM:  1 VIEW(S) XRAY OF THE CHEST 08/13/2024 09:03:00 PM COMPARISON: CT chest 08/10/2023. CLINICAL HISTORY: Abdominal pain. FINDINGS: LUNGS AND PLEURA: No focal pulmonary opacity. No vascular congestion, edema, or consolidation. No pleural effusion. No pneumothorax. HEART AND MEDIASTINUM: Mild cardiac enlargement. Mediastinal contours appear intact. Calcification of the aorta. BONES AND SOFT TISSUES: No acute osseous abnormality. IMPRESSION: 1. No acute cardiopulmonary findings. Electronically signed by: Elsie Gravely MD 08/13/2024 09:06 PM EST RP Workstation: [id number]   CT ABDOMEN PELVIS W CONTRAST Result Date: 08/12/2024 CLINICAL DATA:  Acute generalized abdominal pain EXAM: CT ABDOMEN AND PELVIS WITH CONTRAST TECHNIQUE: Multidetector CT imaging of the abdomen and pelvis was performed using the standard protocol following bolus administration of intravenous contrast. RADIATION DOSE REDUCTION: This exam was performed according to the departmental dose-optimization program which includes automated exposure control, adjustment of the mA and/or kV according to patient size and/or use of iterative reconstruction technique. CONTRAST:  OMNIPAQUE  IOHEXOL  300 MG/ML   SOLN COMPARISON:  February 15, 2018 FINDINGS: Lower chest: Visualized lung bases are unremarkable. Moderately dilated esophagus is noted with moderate wall thickening suggesting esophagitis. Hepatobiliary: No focal liver abnormality is seen. No gallstones, gallbladder wall thickening, or biliary dilatation. Pancreas: Unremarkable. No pancreatic ductal dilatation or surrounding inflammatory changes. Spleen: Normal in size without focal abnormality. Adrenals/Urinary Tract: Adrenal glands are unremarkable. Kidneys are normal, without renal calculi, focal lesion, or hydronephrosis. Bladder is unremarkable. Stomach/Bowel: Moderate wall thickening of distal stomach is noted concerning for gastritis or peptic ulcer disease. Possible ulceration is noted along lesser curvature. Endoscopy is recommended. There is no evidence of bowel obstruction. Appendix is unremarkable. Vascular/Lymphatic: Aortic atherosclerosis. No enlarged abdominal or pelvic lymph nodes. Reproductive: Status post hysterectomy. No adnexal masses. Other: No abdominal wall hernia or abnormality. No abdominopelvic ascites. Musculoskeletal: No acute or significant osseous findings. IMPRESSION: 1. Moderate wall thickening of distal stomach is noted concerning for gastritis or peptic ulcer disease. Possible ulceration is noted along lesser curvature. Endoscopy is recommended. 2. Moderately dilated esophagus is noted with moderate wall thickening suggesting esophagitis. 3. Aortic atherosclerosis. Aortic Atherosclerosis (ICD10-I70.0). Electronically Signed   By: Lynwood Landy Raddle M.D.   On: 08/12/2024 10:02        Scheduled Meds:  folic acid   1 mg Oral Daily   LORazepam   0-4 mg Intravenous Q6H   Followed by   NOREEN ON 08/16/2024] LORazepam   0-4 mg Intravenous Q12H   multivitamin with minerals  1 tablet Oral Daily   pantoprazole  (PROTONIX ) IV  40 mg Intravenous Q12H   senna-docusate  1 tablet Oral QHS   sodium chloride  flush  3 mL Intravenous Q12H    thiamine   100 mg Oral Daily   Or   thiamine   100 mg Intravenous Daily   Continuous Infusions:  [START ON 08/15/2024] fluconazole  (DIFLUCAN ) IV     lactated ringers  125 mL/hr at 08/14/24 0838     LOS: 0 days    Time spent: 45 minutes    Toribio Hummer, MD [facility]   To contact the attending provider between 7A-7P or the covering provider during after hours 7P-7A, please log into the web site [email or web address] and access using universal [place] password for that web site. If you do not have the password, please call the hospital operator.  08/14/2024, 9:40 AM    "

## 2024-08-14 NOTE — Progress Notes (Addendum)
 Came to find patient diaphoretic/tachycardic/screaming in abdominal pain at 1800. EKG and VS obtained. After interventions pt HR improved to 110 and O2 100 on 2L, gave IV dilaudid  1 mg and additional 0.5 mg ordered. MD Sebastian to bedside to assess pt. Stable at this time, will monitor closely.    08/14/24 1806  Assess: MEWS Score  Temp 98.2 F (36.8 C)  BP (!) 156/105  MAP (mmHg) 122  Pulse Rate (!) 122  Resp (!) 32  Level of Consciousness Alert  SpO2 (!) 87 %  O2 Device Nasal Cannula  O2 Flow Rate (L/min) 6 L/min  Assess: MEWS Score  MEWS Temp 0  MEWS Systolic 0  MEWS Pulse 2  MEWS RR 2  MEWS LOC 0  MEWS Score 4  MEWS Score Color Red  Assess: if the MEWS score is Yellow or Red  Were vital signs accurate and taken at a resting state? Yes  Does the patient meet 2 or more of the SIRS criteria? Yes  Does the patient have a confirmed or suspected source of infection? No  MEWS guidelines implemented  Yes, red  Treat  MEWS Interventions Considered administering scheduled or prn medications/treatments as ordered  Take Vital Signs  Increase Vital Sign Frequency  Red: Q1hr x2, continue Q4hrs until patient remains green for 12hrs  Escalate  MEWS: Escalate Red: Discuss with charge nurse and notify provider. Consider notifying RRT. If remains red for 2 hours consider need for higher level of care  Notify: Charge Nurse/RN  Name of Charge Nurse/RN Notified Rosina Alstrom, RN  Provider Notification  Provider Name/Title Sebastian Sieving MD  Date Provider Notified 08/14/24  Time Provider Notified [phone]  Method of Notification  (secure chat)  Notification Reason Change in status;Requested by patient/family  Provider response At bedside;See new orders  Date of Provider Response 08/14/24  Time of Provider Response 1840  Notify: Rapid Response  Name of Rapid Response RN Notified Zoe, RN  Date Rapid Response Notified 08/14/24  Time Rapid Response Notified 1830  Assess: SIRS CRITERIA  SIRS  Temperature  0  SIRS Respirations  1  SIRS Pulse 1  SIRS WBC 0  SIRS Score Sum  2

## 2024-08-14 NOTE — H&P (Signed)
 " History and Physical    Jennifer [name] FMW:[id number] DOB: Feb 24, 1961 DOA: 08/13/2024  PCP: Delbert Clam, MD  Patient coming from: Home  I have personally briefly reviewed patient's old medical records in [facility]  Chief Complaint: Abdominal pain, black stools  HPI: Jennifer [name] is a 64 y.o. female with medical history significant for HTN, BPD, substance use disorder, tobacco use who presented to the ED for evaluation of abdominal pain and black stools.  Patient was initially admitted on 08/12/2024 with melena secondary to upper GI bleeding.  Hemoglobin was 14.1.  She was seen by GI and underwent EGD morning of 08/13/2024.  This showed esophageal plaques suspicious for candidiasis, nonbleeding gastric ulcer with pigmented material, and gastritis.  Patient ultimately left AMA after the EGD.  It was noted that creatinine worsened from 1.19 to 1.63 on repeat morning labs.  Patient states that she had to leave to make arrangements for the care of a child that she is responsible for.  She says that she returned because she was having ongoing abdominal pain with nausea and vomiting.  She has not been eating much due to her stomach pains.  She reports ongoing dark black stools.  Patient states that she takes Aleve  every night.  Patient also states that she is having intermittent chest pains but points to her abdomen when asked to clarify where the pain is located.  She reports ongoing tobacco use, smokes 1 pack/day.  She states that she drinks occasionally, last use of alcohol was about 5 days ago.  She reports a history of recreational drug use but none recently.  ED Course  Labs/Imaging on admission: I have personally reviewed following labs and imaging studies.  Initial vitals showed BP 119/74, pulse 78, RR 20, temp 97.7 F, SpO2 99% on room air.  Labs showed WBC 10.8, hemoglobin 14.4, platelets 209, sodium 143, potassium 4.2, bicarb 26, BUN 41, creatinine 2.66, serum glucose  107, lipase 36, INR 1.0.  Troponin T 121.  UDS pending collection.  Portable chest x-ray negative for focal consolidation, edema, effusion.  CT chest/abdomen/pelvis without contrast negative for acute findings, no evidence of bowel perforation.  Gastric wall thickening in the distal stomach is similar to prior study.  Esophageal dilatation also similar to prior studies.  Nonobstructing 3 mm right lower pole renal stone noted.  No hydronephrosis or hydroureter.  Patient was given 1 L LR, IV Protonix  80 mg, IV Haldol  2 mg, IV Ativan  2 mg, IV Dilaudid  1 mg x 2.  EDP discussed with on-call cardiology who recommended trending troponin level and if continues to rise then discussed with GI regarding safety of antiplatelet use.  They suggested there was no need for patient to be transferred to [facility] at this time.  The hospitalist service was consulted for admission.  Review of Systems: All systems reviewed and are negative except as documented in history of present illness above.   Past Medical History:  Diagnosis Date   bipolar    Bipolar 1 disorder (HCC)    Depression    Fibroids    now s/p hysterectomy   Hypertension    Left ventricular hypertrophy    Polysubstance abuse (HCC)    IV drug us , cocaine on occassion, smoking and alcoholism, has been going to AA, not completely sober    Past Surgical History:  Procedure Laterality Date   ABDOMINAL HYSTERECTOMY     2003   BILATERAL SALPINGOOPHORECTOMY     01/2010   DILATION  AND CURETTAGE OF UTERUS     EXPLORATORY LAPAROTOMY     complex pelvic mass 2011   TONSILLECTOMY      Social History: She reports ongoing tobacco use, smokes 1 pack/day.  She states that she drinks occasionally, last use of alcohol was about 5 days ago.  She reports a history of recreational drug use but none recently.  Allergies[1]  Family History  Problem Relation Age of Onset   Hypertension Mother    Hypertension Sister    Diabetes Father      Prior to  Admission medications  Medication Sig Start Date End Date Taking? Authorizing Provider  amLODipine  (NORVASC ) 10 MG tablet TAKE 1 TABLET (10 MG TOTAL) BY MOUTH DAILY (Office visit for future refills) 07/14/24   Newlin, Enobong, MD  atorvastatin  (LIPITOR) 10 MG tablet Take 1 tablet (10 mg total) by mouth daily. 07/14/24   Newlin, Enobong, MD  Blood Pressure Monitoring (BLOOD PRESSURE CUFF) MISC 1 Units by Does not apply route daily. Patient not taking: No sig reported 05/24/24   Mayers, Cari S, PA-C  esomeprazole  (NEXIUM ) 40 MG capsule Take 1 capsule (40 mg total) by mouth daily. Patient not taking: Reported on 08/12/2024 07/14/24   Newlin, Enobong, MD  lisinopril -hydrochlorothiazide  (ZESTORETIC ) 20-12.5 MG tablet Take 2 tablets by mouth daily. 07/14/24   Newlin, Enobong, MD  metoprolol  succinate (TOPROL -XL) 50 MG 24 hr tablet Take 1 tablet (50 mg total) by mouth daily. 07/14/24   Delbert Clam, MD    Physical Exam: Vitals:   08/13/24 1952 08/13/24 2200 08/13/24 2322  BP: 119/74 122/74 112/63  Pulse: 78 71 76  Resp: 20 20 16   Temp: 97.7 F (36.5 C)  (!) 97.4 F (36.3 C)  TempSrc: Oral  Oral  SpO2: 99% (!) 88% 99%   Constitutional: Resting supine in bed, fatigued Eyes: EOMI ENMT: Mucous membranes are dry. Posterior pharynx clear of any exudate or lesions.Normal dentition.  Neck: normal, supple, no masses. Respiratory: clear to auscultation bilaterally, no wheezing, no crackles. Normal respiratory effort. No accessory muscle use.  Cardiovascular: Regular rate and rhythm, no murmurs / rubs / gallops. No extremity edema. 2+ pedal pulses. Abdomen: Epigastric tenderness, no masses palpated. Musculoskeletal: no clubbing / cyanosis. No joint deformity upper and lower extremities. Good ROM, no contractures. Normal muscle tone.  Skin: no rashes, lesions, ulcers. No induration Neurologic: Sensation intact. Strength 5/5 in all 4.  Psychiatric: Alert and oriented x 3. Normal mood.   EKG: Personally  reviewed. Sinus rhythm, rate 72, LVH with IVCD and secondary repolarization changes, diffuse T wave inversions.  Similar to previous EKGs.  Assessment/Plan Principal Problem:   AKI (acute kidney injury) Active Problems:   Acute upper GI bleeding   Essential hypertension   Esophageal candidiasis (HCC)   Gastric ulcer   Elevated troponin   Jennifer [name] is a 64 y.o. female with medical history significant for HTN, BPD, substance use disorder, tobacco use who is admitted with AKI and upper GI bleed.  Assessment and Plan: Acute kidney injury: Creatinine 2.66 on admission compared to 1.19 on 08/13/2024.  Suspect secondary to volume depletion from decreased oral intake in setting of esophageal candidiasis as well as NSAID use. - Continue IV fluid hydration overnight - Obtain urine studies - Obtain renal ultrasound - Bladder scan and In-N-Out cath as needed  Melena/upper GI bleed secondary to gastric ulcer/gastritis: EGD 08/13/2024 showed esophageal plaques suspicious for candidiasis, nonbleeding gastric ulcer with pigmented material, and gastritis.  Hemoglobin stable at 14.4.  Patient reports using Aleve  every night. - Continue IV Protonix  40 mg twice daily - Patient advised to avoid NSAIDs  Esophageal candidiasis: EGD showed changes suspicious for esophageal candidiasis.  Pathology pending.  Will start on renal dosed IV Diflucan  200 mg daily.  Allow liquid diet and advance as tolerated.  Elevated troponin: Troponin T 121 on arrival.  Patient reports chest pain but points across her abdomen when asked to clarify location of pain.  CT chest/abdomen/pelvis negative for acute pathology.  EKG shows diffuse T wave inversions, nonspecific ST depressions which is unchanged from prior EKGs.  Elevated troponin likely demand ischemia in setting of GI bleed, dehydration, and AKI. - Keep on telemetry - Follow repeat troponin level  Hypertension: Currently normotensive.  Holding  antihypertensives.  History of substance use disorder: Patient reports occasional alcohol use, last use about 5 days ago.  She reports a history of recreational drug use but denies any recent use.  UDS pending.  Tobacco use: Patient reports smoking a pack per day.  She declines nicotine patch.   DVT prophylaxis: SCDs Start: 08/14/24 0128 Code Status: Full code Family Communication: None present on admission Disposition Plan: Pending clinical progress Consults called: EP discussed with on-call cardiology Severity of Illness: The appropriate patient status for this patient is OBSERVATION. Observation status is judged to be reasonable and necessary in order to provide the required intensity of service to ensure the patient's safety. The patient's presenting symptoms, physical exam findings, and initial radiographic and laboratory data in the context of their medical condition is felt to place them at decreased risk for further clinical deterioration. Furthermore, it is anticipated that the patient will be medically stable for discharge from the hospital within 2 midnights of admission.   Jorie Blanch MD [facility]  If 7PM-7AM, please contact night-coverage [email or web address]  08/14/2024, 1:37 AM      [1] No Known Allergies  "

## 2024-08-14 NOTE — Consult Note (Signed)
 Referring Provider: Dr. Sebastian Primary Care Physician:  Delbert Clam, MD Primary Gastroenterologist:  [facility]  Reason for Consultation:  Melena  HPI: Jennifer [name] is a 64 y.o. female who was admitted last week for GI bleed and found to have a large cratered gastric ulcer with signs of recent bleeding. She left AMA yesterday and came back overnight with ongoing melena and worsened abdominal pain, nausea, and vomiting. Noncontrast abd/pelvis CT negative for bowel perforation or acute findings. Being evaluated by cardiology for chest pain. Hgb 12.6.  Past Medical History:  Diagnosis Date   bipolar    Bipolar 1 disorder (HCC)    Depression    Fibroids    now s/p hysterectomy   Hypertension    Left ventricular hypertrophy    Polysubstance abuse (HCC)    IV drug us , cocaine on occassion, smoking and alcoholism, has been going to AA, not completely sober    Past Surgical History:  Procedure Laterality Date   ABDOMINAL HYSTERECTOMY     2003   BILATERAL SALPINGOOPHORECTOMY     01/2010   BONE BIOPSY  08/13/2024   Procedure: BIOPSY, GI;  Surgeon: Dianna Specking, MD;  Location: [facility];  Service: Gastroenterology;;   DILATION AND CURETTAGE OF UTERUS     ESOPHAGOGASTRODUODENOSCOPY N/A 08/13/2024   Procedure: EGD (ESOPHAGOGASTRODUODENOSCOPY);  Surgeon: Dianna Specking, MD;  Location: [facility];  Service: Gastroenterology;  Laterality: N/A;   EXPLORATORY LAPAROTOMY     complex pelvic mass 2011   TONSILLECTOMY      Prior to Admission medications  Medication Sig Start Date End Date Taking? Authorizing Provider  amLODipine  (NORVASC ) 10 MG tablet TAKE 1 TABLET (10 MG TOTAL) BY MOUTH DAILY (Office visit for future refills) 07/14/24   Newlin, Enobong, MD  atorvastatin  (LIPITOR) 10 MG tablet Take 1 tablet (10 mg total) by mouth daily. 07/14/24   Newlin, Enobong, MD  Blood Pressure Monitoring (BLOOD PRESSURE CUFF) MISC 1 Units by Does not apply route daily. Patient not taking:  No sig reported 05/24/24   Mayers, Cari S, PA-C  esomeprazole  (NEXIUM ) 40 MG capsule Take 1 capsule (40 mg total) by mouth daily. Patient not taking: Reported on 08/12/2024 07/14/24   Newlin, Enobong, MD  lisinopril -hydrochlorothiazide  (ZESTORETIC ) 20-12.5 MG tablet Take 2 tablets by mouth daily. 07/14/24   Newlin, Enobong, MD  metoprolol  succinate (TOPROL -XL) 50 MG 24 hr tablet Take 1 tablet (50 mg total) by mouth daily. 07/14/24   Newlin, Enobong, MD    Scheduled Meds:  folic acid   1 mg Oral Daily   LORazepam   0-4 mg Intravenous Q6H   Followed by   NOREEN ON 08/16/2024] LORazepam   0-4 mg Intravenous Q12H   multivitamin with minerals  1 tablet Oral Daily   pantoprazole  (PROTONIX ) IV  40 mg Intravenous Q12H   senna-docusate  1 tablet Oral QHS   sodium chloride  flush  3 mL Intravenous Q12H   thiamine   100 mg Oral Daily   Or   thiamine   100 mg Intravenous Daily   Continuous Infusions:  [START ON 08/15/2024] fluconazole  (DIFLUCAN ) IV     lactated ringers  125 mL/hr at 08/14/24 0838   PRN Meds:.acetaminophen  **OR** acetaminophen , HYDROmorphone  (DILAUDID ) injection, LORazepam  **OR** LORazepam , melatonin, ondansetron  **OR** ondansetron  (ZOFRAN ) IV, oxyCODONE , polyethylene glycol  Allergies as of 08/13/2024   (No Known Allergies)    Family History  Problem Relation Age of Onset   Hypertension Mother    Hypertension Sister    Diabetes Father     Social History  Socioeconomic History   Marital status: Single    Spouse name: Not on file   Number of children: 0   Years of education: Not on file   Highest education level: Some college, no degree  Occupational History   Not on file  Tobacco Use   Smoking status: Every Day    Current packs/day: 1.00    Average packs/day: 1 pack/day for 20.0 years (20.0 ttl pk-yrs)    Types: Cigarettes   Smokeless tobacco: Never   Tobacco comments:    Wants to quit.   Vaping Use   Vaping status: Never Used  Substance and Sexual Activity   Alcohol  use: Yes    Comment: occ.   Drug use: Not Currently    Types: Cocaine, Marijuana   Sexual activity: Not Currently    Birth control/protection: Surgical  Other Topics Concern   Not on file  Social History Narrative   Financial assistance approved for 100% discount at [facility] and has [facility] card per Barnie Potters   07/19/2010         Social Drivers of Health   Tobacco Use: High Risk (08/13/2024)   Patient History    Smoking Tobacco Use: Every Day    Smokeless Tobacco Use: Never    Passive Exposure: Not on file  Financial Resource Strain: Not on file  Food Insecurity: No Food Insecurity (08/14/2024)   Epic    Worried About Programme Researcher, Broadcasting/film/video in the Last Year: Never true    Ran Out of Food in the Last Year: Never true  Transportation Needs: Unmet Transportation Needs (08/14/2024)   Epic    Lack of Transportation (Medical): Yes    Lack of Transportation (Non-Medical): Yes  Physical Activity: Not on file  Stress: Not on file  Social Connections: Not on file  Intimate Partner Violence: Not At Risk (08/14/2024)   Epic    Fear of Current or Ex-Partner: No    Emotionally Abused: No    Physically Abused: No    Sexually Abused: No  Depression (PHQ2-9): Low Risk (05/24/2024)   Depression (PHQ2-9)    PHQ-2 Score: 1  Recent Concern: Depression (PHQ2-9) - Medium Risk (03/04/2024)   Depression (PHQ2-9)    PHQ-2 Score: 6  Alcohol Screen: Not on file  Housing: Low Risk (08/14/2024)   Epic    Unable to Pay for Housing in the Last Year: No    Number of Times Moved in the Last Year: 0    Homeless in the Last Year: No  Utilities: Not At Risk (08/14/2024)   Epic    Threatened with loss of utilities: No  Health Literacy: Not on file    Review of Systems: All negative except as stated above in HPI.  Physical Exam: Vital signs: Vitals:   08/14/24 0800 08/14/24 0838  BP: (!) 105/56 (!) 105/56  Pulse: 89 87  Resp:  20  Temp:  97.9 F (36.6 C)  SpO2:  93%   Last BM Date : 08/13/24 General:  Disheveled, thin, mild acute distress   Head: normocephalic, atraumatic Eyes: anicteric sclera ENT: oropharynx clear Neck: supple, nontender Lungs:  Clear throughout to auscultation.   No wheezes, crackles, or rhonchi. No acute distress. Heart:  Regular rate and rhythm; no murmurs, clicks, rubs,  or gallops. Abdomen: diffuse tenderness with guarding, soft, nondistended, +BS  Rectal:  Deferred Ext: no edema  GI:  Lab Results: Recent Labs    08/13/24 0519 08/13/24 2025 08/14/24 0431  WBC 7.6 10.8* 8.1  HGB 13.8 14.4 12.6  HCT 42.5 44.0 38.8  PLT 195 209 164   BMET Recent Labs    08/13/24 0519 08/13/24 2025 08/14/24 0431  NA 140 143 141  K 3.9 4.2 4.4  CL 104 104 108  CO2 24 26 22   GLUCOSE 104* 107* 115*  BUN 38* 41* 40*  CREATININE 1.63* 2.66* 1.74*  CALCIUM  10.2 10.7* 9.6   LFT Recent Labs    08/13/24 2025  PROT 8.0  ALBUMIN 4.3  AST 37  ALT 20  ALKPHOS 60  BILITOT 0.4   PT/INR Recent Labs    08/13/24 2127  LABPROT 13.5  INR 1.0       Impression/Plan: Gastric ulcer bleed - no free air on Xray and no perforation on noncontrast CT. If bleeding worsens, then will need a CT angiogram and if active bleeding seen embolization by IR. Due to size and depth of ulcer would not recommend endoscopic management if active bleeding develops. Clear liquid diet. IV PPI Q 12 hours. Supportive care.    LOS: 0 days   [name]  08/14/2024, 10:26 AM  Questions please call [phone]

## 2024-08-14 NOTE — Progress Notes (Signed)
" °  Echocardiogram 2D Echocardiogram has been performed.  Tinnie FORBES Gosling RDCS 08/14/2024, 1:57 PM "

## 2024-08-14 NOTE — ED Provider Notes (Signed)
 " [place] EMERGENCY DEPARTMENT AT [facility] Provider Note   CSN: [id number] Arrival date & time: 08/13/24  1945     Patient presents with: Melena   Jennifer [name] is a 64 y.o. female.   HPI     64 y.o. female with medical history significant for bipolar 1 disorder, hypertension, polysubstance abuse (including documented use of occasional cocaine use), ongoing tobacco abuse and alcohol use disorder, HIV comes in with chief complaint of abdominal pain, nausea and vomiting.  Patient had an upper endoscopy done earlier today.  She left AMA.  Patient reports that she is having severe epigastric abdominal pain that is severe.  The pain is worse than the pain she had prior to the upper endoscopy.  Patient reports that she has had multiple episodes of nausea and vomiting.  She has not seen any blood in the vomit, but continues to have tarry stool.  Later on in patient's stay, she started complaining of severe chest discomfort and shortness of breath.  Patient has no previous cardiac disease history.  Prior to Admission medications  Medication Sig Start Date End Date Taking? Authorizing Provider  amLODipine  (NORVASC ) 10 MG tablet TAKE 1 TABLET (10 MG TOTAL) BY MOUTH DAILY (Office visit for future refills) 07/14/24   Newlin, Enobong, MD  atorvastatin  (LIPITOR) 10 MG tablet Take 1 tablet (10 mg total) by mouth daily. 07/14/24   Newlin, Enobong, MD  Blood Pressure Monitoring (BLOOD PRESSURE CUFF) MISC 1 Units by Does not apply route daily. Patient not taking: No sig reported 05/24/24   Mayers, Cari S, PA-C  esomeprazole  (NEXIUM ) 40 MG capsule Take 1 capsule (40 mg total) by mouth daily. Patient not taking: Reported on 08/12/2024 07/14/24   Newlin, Enobong, MD  lisinopril -hydrochlorothiazide  (ZESTORETIC ) 20-12.5 MG tablet Take 2 tablets by mouth daily. 07/14/24   Newlin, Enobong, MD  metoprolol  succinate (TOPROL -XL) 50 MG 24 hr tablet Take 1 tablet (50 mg total) by mouth daily. 07/14/24    Newlin, Enobong, MD    Allergies: Patient has no known allergies.    Review of Systems  All other systems reviewed and are negative.   Updated Vital Signs BP 120/67 (BP Location: Right Arm)   Pulse (!) 101   Temp 99.8 F (37.7 C) (Oral)   Resp 20   Ht 5' 4 (1.626 m)   Wt 81.8 kg   LMP 12/04/2009   SpO2 95%   BMI 30.95 kg/m   Physical Exam Vitals and nursing note reviewed.  Constitutional:      General: She is in acute distress.     Appearance: She is well-developed. She is not diaphoretic.  HENT:     Head: Atraumatic.  Cardiovascular:     Rate and Rhythm: Normal rate.     Comments: 2+ and equal radial pulse Pulmonary:     Effort: Pulmonary effort is normal.  Musculoskeletal:     Cervical back: Normal range of motion.  Skin:    General: Skin is warm and dry.  Neurological:     Mental Status: She is alert and oriented to person, place, and time.     (all labs ordered are listed, but only abnormal results are displayed) Labs Reviewed  CBC WITH DIFFERENTIAL/PLATELET - Abnormal; Notable for the following components:      Result Value   WBC 10.8 (*)    Neutro Abs 8.5 (*)    All other components within normal limits  COMPREHENSIVE METABOLIC PANEL WITH GFR - Abnormal; Notable for  the following components:   Glucose, Bld 107 (*)    BUN 41 (*)    Creatinine, Ser 2.66 (*)    Calcium  10.7 (*)    GFR, Estimated 20 (*)    All other components within normal limits  BASIC METABOLIC PANEL WITH GFR - Abnormal; Notable for the following components:   Glucose, Bld 115 (*)    BUN 40 (*)    Creatinine, Ser 1.74 (*)    GFR, Estimated 33 (*)    All other components within normal limits  TROPONIN T, HIGH SENSITIVITY - Abnormal; Notable for the following components:   Troponin T High Sensitivity 121 (*)    All other components within normal limits  TROPONIN T, HIGH SENSITIVITY - Abnormal; Notable for the following components:   Troponin T High Sensitivity 127 (*)    All  other components within normal limits  PROTIME-INR  LIPASE, BLOOD  CBC  MAGNESIUM   HEMOGLOBIN AND HEMATOCRIT, BLOOD  URINE DRUG SCREEN  SODIUM, URINE, RANDOM  CREATININE, URINE, RANDOM  URINALYSIS, ROUTINE W REFLEX MICROSCOPIC  URINE DRUG SCREEN  TYPE AND SCREEN    EKG: EKG Interpretation Date/Time:  Saturday August 13 2024 22:42:50 EST Ventricular Rate:  87 PR Interval:  166 QRS Duration:  164 QT Interval:  416 QTC Calculation: 501 R Axis:   66  Text Interpretation: Sinus rhythm Probable left atrial enlargement Right bundle branch block LVH with IVCD and secondary repol abnrm Prolonged QT interval No significant change since last tracing Confirmed by Charlyn Sora ([id number]) on 08/13/2024 10:45:44 PM  Radiology: ARCOLA Abd Portable 1V Result Date: 08/14/2024 EXAM: 1 VIEW XRAY OF THE ABDOMEN 08/14/2024 09:11:00 AM COMPARISON: CT chest abdomen and pelvis 08/13/2024. CLINICAL HISTORY: Abdominal pain FINDINGS: BOWEL: There is a paucity of bowel gas within the abdomen and pelvis, which limits assessment for underlying bowel pathology, specifically bowel obstruction. Within this limitation, no dilated bowel loops are noted. The paucity of bowel gas could represent a non-obstructive process, early or incomplete obstruction not yet causing significant dilation, or reduced bowel motility. SOFT TISSUES: A previous 3 mm right lower pole renal calculus is not currently seen. BONES: No acute fracture. IMPRESSION: 1. Paucity of bowel gas limits assessment for bowel obstruction, with no dilated bowel loops identified. 2. A previous 3 mm right lower pole renal calculus is not currently seen. This could indicate it has passed, is too small to be visualized on plain film, or is obscured. If there is a clinical concern for ureteral calculi, particularly if the patient has renal colic, consider further evaluation with a renal stone protocol CT. Electronically signed by: Waddell Calk MD 08/14/2024 09:18 AM EST RP  Workstation: [id number]   US  RENAL Result Date: 08/14/2024 EXAM: US  Retroperitoneum Complete, Renal. 08/14/2024 07:14:38 AM TECHNIQUE: Real-time ultrasonography of the retroperitoneum renal was performed. COMPARISON: CT 08/13/2024 CLINICAL HISTORY: [id number] AKI (acute kidney injury) 409830 AKI (acute kidney injury) FINDINGS: FINDINGS: RIGHT KIDNEY/URETER: The right kidney measures 11.2 x 5.3 x 6.1 cm with a volume of 187.6 cc. Normal cortical echogenicity. No hydronephrosis. Inferior pole right kidney stone measures 5 mm. No mass. LEFT KIDNEY/URETER: Left kidney measures 11.0 x 5.3 x 5.9 cm with a volume of 180.4 cc. Normal cortical echogenicity. No hydronephrosis. No calculus. No mass. BLADDER: Urinary bladder is decompressed. Patient voided prior to exam. IMPRESSION: 1. No acute findings. 2. Inferior pole right kidney stone measuring 5 mm. Electronically signed by: Waddell Calk MD 08/14/2024 07:21 AM EST RP Workstation: [id number]   CT  CHEST ABDOMEN PELVIS WO CONTRAST Result Date: 08/13/2024 EXAM: CT CHEST, ABDOMEN AND PELVIS WITHOUT CONTRAST 08/13/2024 11:46:00 PM TECHNIQUE: CT of the chest, abdomen and pelvis was performed without the administration of intravenous contrast. Multiplanar reformatted images are provided for review. Automated exposure control, iterative reconstruction, and/or weight based adjustment of the mA/kV was utilized to reduce the radiation dose to as low as reasonably achievable. COMPARISON: Chest radiograph 08/13/2024, CT abdomen and pelvis 08/12/2024, and CT chest 08/10/2023. CLINICAL HISTORY: Abdominal pain, acute, nonlocalized; post EGD and has severe pain. FINDINGS: CHEST: MEDIASTINUM AND LYMPH NODES: Heart and pericardium are unremarkable. The central airways are clear. No mediastinal, hilar or axillary lymphadenopathy. Diffusely dilated esophagus filled with air and fluid. This is similar to the prior studies and likely represents chronic dysmotility such as achalasia. LUNGS AND  PLEURA: 4 mm right lower lung nodule, series 4 image 159. No change since prior study. No imaging follow-up is indicated by size criteria. No focal consolidation or pulmonary edema. No pleural effusion. No pneumothorax. ABDOMEN AND PELVIS: LIVER: Unremarkable. GALLBLADDER AND BILE DUCTS: Increased density throughout the gallbladder likely representing vicarious contrast excretion. No bile duct dilatation. SPLEEN: No acute abnormality. PANCREAS: No acute abnormality. ADRENAL GLANDS: No acute abnormality. KIDNEYS, URETERS AND BLADDER: Right lower pole renal stone measuring 3 mm in diameter. No hydronephrosis or hydroureter. No perinephric or periureteral stranding. Residual contrast material in the bladder. No bladder wall thickening. GI AND BOWEL: The stomach is partially decompressed, but there is evidence of gastric wall thickening in the distal stomach, similar to prior study. Residual contrast material demonstrated in the colon. The small bowel and colon are decompressed. The appendix is normal. There is no bowel obstruction. REPRODUCTIVE ORGANS: The uterus appears surgically absent. No abnormal adnexal masses. PERITONEUM AND RETROPERITONEUM: No free air or free fluid in the abdomen or pelvis to suggest perforation. VASCULATURE: Calcification of the aorta. Aorta is normal in caliber. ABDOMINAL AND PELVIS LYMPH NODES: No lymphadenopathy. BONES AND SOFT TISSUES: Mild degenerative changes in the spine. Slight anterior subluxation of L4 on L5, likely degenerative. No change since prior study. No acute osseous abnormality. No focal soft tissue abnormality. IMPRESSION: 1. No acute findings. No evidence of bowel perforation . 2. Gastric wall thickening in the distal stomach, similar to prior study. 3. Esophageal dilatation suggests chronic esophageal dysmotility such as achalasia, similar to prior studies. 4. Nonobstructing 3 mm right lower pole renal stone. No hydronephrosis or hydroureter. Electronically signed by:  Elsie Gravely MD 08/13/2024 11:55 PM EST RP Workstation: [id number]   DG Chest Port 1 View Result Date: 08/13/2024 EXAM: 1 VIEW(S) XRAY OF THE CHEST 08/13/2024 09:03:00 PM COMPARISON: CT chest 08/10/2023. CLINICAL HISTORY: Abdominal pain. FINDINGS: LUNGS AND PLEURA: No focal pulmonary opacity. No vascular congestion, edema, or consolidation. No pleural effusion. No pneumothorax. HEART AND MEDIASTINUM: Mild cardiac enlargement. Mediastinal contours appear intact. Calcification of the aorta. BONES AND SOFT TISSUES: No acute osseous abnormality. IMPRESSION: 1. No acute cardiopulmonary findings. Electronically signed by: Elsie Gravely MD 08/13/2024 09:06 PM EST RP Workstation: [id number]     .Critical Care  Performed by: Charlyn Sora, MD Authorized by: Charlyn Sora, MD   Critical care provider statement:    Critical care time (minutes):  41   Critical care was necessary to treat or prevent imminent or life-threatening deterioration of the following conditions:  Circulatory failure   Critical care was time spent personally by me on the following activities:  Development of treatment plan with patient or surrogate, discussions with consultants,  evaluation of patient's response to treatment, examination of patient, ordering and review of laboratory studies, ordering and review of radiographic studies, ordering and performing treatments and interventions, pulse oximetry, re-evaluation of patient's condition, review of old charts and obtaining history from patient or surrogate    Medications Ordered in the ED  pantoprazole  (PROTONIX ) injection 40 mg (40 mg Intravenous Given 08/14/24 0836)  fluconazole  (DIFLUCAN ) IVPB 200 mg (has no administration in time range)  sodium chloride  flush (NS) 0.9 % injection 3 mL (3 mLs Intravenous Not Given 08/14/24 0934)  acetaminophen  (TYLENOL ) tablet 650 mg (650 mg Oral Given 08/14/24 0446)    Or  acetaminophen  (TYLENOL ) suppository 650 mg ( Rectal See Alternative  08/14/24 0446)  ondansetron  (ZOFRAN ) tablet 4 mg (has no administration in time range)    Or  ondansetron  (ZOFRAN ) injection 4 mg (has no administration in time range)  polyethylene glycol (MIRALAX  / GLYCOLAX ) packet 17 g (has no administration in time range)  melatonin tablet 3 mg (3 mg Oral Given 08/14/24 0245)  lactated ringers  infusion ( Intravenous New Bag/Given 08/14/24 1031)  LORazepam  (ATIVAN ) tablet 1-4 mg (2 mg Oral Given 08/14/24 1327)    Or  LORazepam  (ATIVAN ) injection 1-4 mg ( Intravenous See Alternative 08/14/24 1327)  thiamine  (VITAMIN B1) tablet 100 mg (100 mg Oral Given 08/14/24 0836)    Or  thiamine  (VITAMIN B1) injection 100 mg ( Intravenous See Alternative 08/14/24 0836)  folic acid  (FOLVITE ) tablet 1 mg (1 mg Oral Given 08/14/24 0837)  multivitamin with minerals tablet 1 tablet (1 tablet Oral Given 08/14/24 0836)  LORazepam  (ATIVAN ) injection 0-4 mg ( Intravenous Not Given 08/14/24 0934)    Followed by  LORazepam  (ATIVAN ) injection 0-4 mg (has no administration in time range)  oxyCODONE  (Oxy IR/ROXICODONE ) immediate release tablet 5 mg (5 mg Oral Given 08/14/24 1435)  senna-docusate (Senokot-S) tablet 1 tablet (has no administration in time range)  HYDROmorphone  (DILAUDID ) injection 1 mg (1 mg Intravenous Given 08/14/24 1316)  pantoprazole  (PROTONIX ) injection 80 mg (80 mg Intravenous Given 08/13/24 2051)  HYDROmorphone  (DILAUDID ) injection 1 mg (1 mg Intravenous Given 08/13/24 2051)  lactated ringers  bolus 1,000 mL (0 mLs Intravenous Stopped 08/14/24 0149)  HYDROmorphone  (DILAUDID ) injection 1 mg (1 mg Intravenous Given 08/13/24 2243)  haloperidol  lactate (HALDOL ) injection 2 mg (2 mg Intravenous Given 08/13/24 2336)  LORazepam  (ATIVAN ) injection 2 mg (2 mg Intravenous Given 08/14/24 0035)  fluconazole  (DIFLUCAN ) IVPB 400 mg (0 mg Intravenous Stopping previously hung infusion 08/14/24 1328)  oxyCODONE  (Oxy IR/ROXICODONE ) immediate release tablet 2.5 mg (2.5 mg Oral Given 08/14/24 0516)  HYDROmorphone   (DILAUDID ) injection 0.5 mg (0.5 mg Intravenous Given 08/14/24 0643)  sodium chloride  0.9 % bolus 1,000 mL (0 mLs Intravenous Stopped 08/14/24 1031)    Clinical Course as of 08/14/24 1515  Sun Aug 14, 2024  9945 Spoke with cardiology regarding elevated troponin.  Recommends trending troponins.  If they were to increase significantly, would need to discuss with gastroenterology regarding heparin and aspirin .  At this time hold.  Does not feel that she needs to be transferred to [facility] at this time. [CH]    Clinical Course User Index [CH] Horton, Charmaine FALCON, MD                                 Medical Decision Making Amount and/or Complexity of Data Reviewed Labs: ordered. Radiology: ordered.  Risk Prescription drug management. Decision regarding hospitalization.   64 year old  patient comes in with chief complaint of epigastric abdominal pain.  While in the ER, she started complaining of severe chest tightness and shortness of breath.  Patient has known past medical history of polysubstance use, hypertension, and signed out AMA this morning after getting upper endoscopy.  She was admitted for melena, severe nausea and vomiting and was found to have esophagitis, likely Candida infection and gastritis.  Patient reports that her current pain is worse than the pain she had prior to her upper GI endoscopy.  Patient appears uncomfortable.  She is wailing in pain.  She has 2+ and equal radial pulse bilaterally.  She denies any back pain.  On exam, patient has diffuse upper quadrant tenderness.  Initial concerns are to rule out perforation and mediastinitis.  I ordered x-ray of the chest, independently interpreted.  There is no evidence of free air or mediastinal widening.  Subsequently, patient's lab indicated elevated creatinine with normal hemoglobin.  I had ordered CT abdomen and pelvis without contrast given patient's persistent pain.  She however started then complaining of chest tightness and  shortness of breath.  She was not able to tolerate CT scan.  EKG was ordered that shows diffuse T wave inversions in nonspecific ST depressions.  No evidence of acute MI.  EKG has no acute changes.  Troponin was also added at that time and it is elevated at 120.  There is no recent cardiac workup and no previous troponins in our system.  In addition to CT scan of the abdomen and pelvis without contrast, will add CT chest without contrast.  Patient was reassessed from dissection perspective, and she continues to have no profound tachycardia, 2+ and equal radial pulse and I clinically have low suspicion for acute dissection.  Patient had received Haldol , she was able to tolerate CT scan.  Patient's care will be signed out to incoming team at this time.  CT scan does not show any acute findings.  Repeat troponin is pending.  She will need admission to the hospital given her AKI and based on the repeat Trope, might need cardiac involvement or transfer to [facility]  I will give her IV Ativan  and get repeat EKG. Holding aspirin  and heparin at this time, pending repeat troponin.   Final diagnoses:  AKI (acute kidney injury)  Elevated troponin    ED Discharge Orders     None          Charlyn Sora, MD 08/14/24 1515  "

## 2024-08-14 NOTE — Hospital Course (Addendum)
 Jennifer [name] is a 64 y.o. female with PMH of  HTN, BPD, substance use disorder, tobacco use who presented to the ED for evaluation of abdominal pain and black stools. Patient was initially admitted on 08/12/2024 with melena secondary to upper GI bleeding, seen by GI and underwent EGD1/10/2024-showed esophageal plaques suspicious for candidiasis, nonbleeding gastric ulcer with pigmented material, and gastritis.  Unfortunately left AMA after the EGD.  It was noted that creatinine worsened from 1.19 to 1.63 on repeat morning labs.  Patient seen again in the ED with worsening AKI, placed on IV fluids and admitted Patient placed on IV Diflucan , IV PPI.  GI was consulted-per GI continue diet as tolerated complete 2 weeks course of Diflucan , PPI twice daily and 6 to 8 weeks follow-up.  At this time patient is stable Waiting for placement.  TOC has informed SNF will not be an option given her insurance and UDS positive, this was discussed with the patient and she is agreeable to go home with outpatient PT, DME  Subjective: Seen and examined  Asking for pain medication, Overnight vitals stable afebrile Waiting for placement, lost IV access and managing an oral regimen  Discharge Diagnoses:    Upper GI bleed due to gastric ulcer/gastritis Large gastric ulcer Esophageal candidiasis Recent admission 1/2 and EGD 1/3-left AMA 1/3: EGD 08/13/2024-esophageal plaques, large nonbleeding gastric ulcer with pigmented material and gastritis.PTA on Aleve  nightly- discontinued.CT chest abdomen pelvis on admission-no acute finding. hemoglobin fairly stable, tolerating diet. Compelte  Diflucan  x 2 wk,continue PPI bid, cont supportive care. Follow-up with GI  in 6 to 8 weeks with [facility]. Recent Labs  Lab 08/16/24 0411 08/17/24 0357  HGB 12.1 10.8*  HCT 36.8 33.4*    AKI Mild metabolic acidosis-resolved: Multifactorial, prerenal azotemia +/-NSAIDl. Aki resolved. Renal us -no acute findings, inferior pole right  kidney stone measuring 5 mm.  Mild thrombocytopenia: Likely in the setting of acute illness.  Stable   Elevated troponin 121>>> 127: Chest pain on presentation but pointing towards upper abdomen likely from gastritis and esophagitis CT chest abdomen and pelvis done on admission with no acute findings, no evidence of bowel perforation, gastric wall thickening in the distal stomach similar to prior study.  Esophageal dilation suggest chronic esophageal dysmotility such as achalasia, similar to prior studies.   EKG done with diffuse T wave inversions, nonspecific ST depressions unchanged from prior EKG. Likely elevated troponin secondary to demand ischemia in the setting of GI bleed, dehydration and AKI. Underwent TTE showing EF > 75%, NWMA, severe LVH- HCM-seen by cardiology to advise additional workup will need outpatient follow-up ? cardiac MRI.   Hypertension: BP stable cont metoprolol .  History of substance use disorder/history of alcohol use Tobacco abuse: Reports occasional alcohol use, UDS positive for multiple substances reported Patient was placed on Ativan  withdrawal protocol however patient significantly drowsy after receiving IV Ativan -discontinued.   Continue nicotine patch  Deconditioning/debility Weakness: Continue PT eval, encouraged mobilization PT Follow up Rec: Skilled Nursing-Short Term Rehab (<3 Hours/Day)08/19/2024 1346    Class I Obesity w/ Body mass index is 30.95 kg/m.: Will benefit with PCP follow-up, weight loss,healthy lifestyle  DVT prophylaxis: Place and maintain sequential compression device Start: 08/14/24 0926 SCDs Start: 08/14/24 0128 no chemical prophylaxis due to bleeding and anemia Code Status:   Code Status: Full Code Family Communication: plan of care discussed with patient at bedside. Patient status is: Remains hospitalized because of severity of illness Level of care: Telemetry   Dispo: The patient is from:  home            Anticipated  disposition home   Objective: Vitals last 24 hrs: Vitals:   08/21/24 2036 08/21/24 2041 08/22/24 0301 08/22/24 0443  BP: (!) 97/57 (!) 99/57 127/81 113/70  Pulse: 71 67 77 80  Resp: (!) 24  19 (!) 24  Temp: 99.6 F (37.6 C)  99.4 F (37.4 C) 97.7 F (36.5 C)  TempSrc: Oral  Oral Oral  SpO2: 94%  93% 92%  Weight:      Height:       Physical Examination: General exam: AAOX3 HEENT:Oral mucosa moist, Ear/Nose WNL grossly Respiratory system: CTA B/l Cardiovascular system: S1 & S2 +, No JVD. Gastrointestinal system: Abdomen soft, mild tenderness present Nervous System: aaox3, moving all extremities,and following commands. Extremities: extremities warm, leg edema neg. Skin: Warm, no rashes MSK: Normal muscle bulk,tone, power.

## 2024-08-14 NOTE — ED Provider Notes (Signed)
 Patient presents with chest and abdominal pain.  Recent EGD and AMA.  Reports ongoing melena.  Polysubstance abuse.  Slightly elevated troponin in the setting of a baseline abnormal EKG.  EKG is unchanged from baseline.  Creatinine rise to 2.66 from 1.6.  Given fluids, Protonix , pain and nausea medication.  CT scans do not show any evidence of perforation.  See discussion with cardiology below.  Physical Exam  BP 112/63   Pulse 76   Temp (!) 97.4 F (36.3 C) (Oral)   Resp 16   LMP 12/04/2009   SpO2 99%   Procedures  Procedures  ED Course / MDM   Clinical Course as of 08/14/24 0055  Sun Aug 14, 2024  0054 Spoke with cardiology regarding elevated troponin.  Recommends trending troponins.  If they were to increase significantly, would need to discuss with gastroenterology regarding heparin and aspirin .  At this time hold.  Does not feel that she needs to be transferred to [facility] at this time. [CH]    Clinical Course User Index [CH] Hiroko Tregre, Charmaine FALCON, MD   Medical Decision Making Amount and/or Complexity of Data Reviewed Labs: ordered. Radiology: ordered.  Risk Prescription drug management. Decision regarding hospitalization.   Problem List Items Addressed This Visit       Genitourinary   AKI (acute kidney injury) - Primary   Other Visit Diagnoses       Elevated troponin                 Bari Charmaine FALCON, MD 08/14/24 [phone]

## 2024-08-15 ENCOUNTER — Encounter (HOSPITAL_COMMUNITY): Payer: Self-pay | Admitting: Gastroenterology

## 2024-08-15 DIAGNOSIS — R7989 Other specified abnormal findings of blood chemistry: Secondary | ICD-10-CM | POA: Diagnosis not present

## 2024-08-15 DIAGNOSIS — I10 Essential (primary) hypertension: Secondary | ICD-10-CM | POA: Diagnosis not present

## 2024-08-15 DIAGNOSIS — B3781 Candidal esophagitis: Secondary | ICD-10-CM | POA: Diagnosis not present

## 2024-08-15 DIAGNOSIS — K253 Acute gastric ulcer without hemorrhage or perforation: Secondary | ICD-10-CM

## 2024-08-15 DIAGNOSIS — N179 Acute kidney failure, unspecified: Secondary | ICD-10-CM | POA: Diagnosis not present

## 2024-08-15 LAB — BASIC METABOLIC PANEL WITH GFR
Anion gap: 12 (ref 5–15)
BUN: 27 mg/dL — ABNORMAL HIGH (ref 8–23)
CO2: 23 mmol/L (ref 22–32)
Calcium: 9.9 mg/dL (ref 8.9–10.3)
Chloride: 107 mmol/L (ref 98–111)
Creatinine, Ser: 1.09 mg/dL — ABNORMAL HIGH (ref 0.44–1.00)
GFR, Estimated: 58 mL/min — ABNORMAL LOW
Glucose, Bld: 95 mg/dL (ref 70–99)
Potassium: 4.7 mmol/L (ref 3.5–5.1)
Sodium: 142 mmol/L (ref 135–145)

## 2024-08-15 LAB — CBC
HCT: 39.4 % (ref 36.0–46.0)
Hemoglobin: 13 g/dL (ref 12.0–15.0)
MCH: 29.5 pg (ref 26.0–34.0)
MCHC: 33 g/dL (ref 30.0–36.0)
MCV: 89.3 fL (ref 80.0–100.0)
Platelets: 149 K/uL — ABNORMAL LOW (ref 150–400)
RBC: 4.41 MIL/uL (ref 3.87–5.11)
RDW: 14.6 % (ref 11.5–15.5)
WBC: 8.6 K/uL (ref 4.0–10.5)
nRBC: 0 % (ref 0.0–0.2)

## 2024-08-15 MED ORDER — SODIUM CHLORIDE 0.9 % IV SOLN: INTRAVENOUS | Status: DC

## 2024-08-15 MED ORDER — LACTATED RINGERS IV SOLN: INTRAVENOUS | Status: DC

## 2024-08-15 MED ORDER — FLUCONAZOLE IN SODIUM CHLORIDE 400-0.9 MG/200ML-% IV SOLN
400.0000 mg | INTRAVENOUS | Status: DC
  Administered 2024-08-16 – 2024-08-18 (×3): 400 mg via INTRAVENOUS
  Filled 2024-08-15 (×3): qty 200

## 2024-08-15 NOTE — Progress Notes (Addendum)
"  °  As below, patient seen and examined.  Patient describes abdominal pain but is not having dyspnea or chest pain.  Troponin minimally elevated but not consistent with acute coronary syndrome.  Echocardiogram shows normal LV function.  Drug screen positive for cocaine.  We do not think she requires further cardiac workup at this point.  Other issues including management of GI bleed per primary service.  Cardiology will sign off.  Please call with questions. Redell Shallow, MD   Progress Note  Patient Name: Jennifer [name] Date of Encounter: 08/15/2024 [facility] Cardiologist: Georganna Archer, MD   Interval Summary   Nursing notes indicate that patient has been drowsy and unsteady Patient unable to answer questions appropriately  Continued to try to get out of bed Able to get redirected   Vital Signs Vitals:   08/15/24 0250 08/15/24 0300 08/15/24 0511 08/15/24 0556  BP: 123/78 110/72 (!) 100/56 106/64  Pulse: 98 98 92 93  Resp:   19   Temp:   98.8 F (37.1 C)   TempSrc:      SpO2:   91%   Weight:      Height:        Intake/Output Summary (Last 24 hours) at 08/15/2024 1020 Last data filed at 08/15/2024 0323 Gross per 24 hour  Intake 1585.48 ml  Output --  Net 1585.48 ml      08/14/2024    7:03 AM 08/12/2024    8:05 AM 07/14/2024    9:22 AM  Last 3 Weights  Weight (lbs) 180 lb 5.4 oz 179 lb 14.3 oz 180 lb  Weight (kg) 81.8 kg 81.6 kg 81.647 kg     Telemetry/ECG  N/A - Personally Reviewed  Physical Exam  GEN: No acute distress, confused.   Neck: unable to assess JVD Cardiac: RRR Respiratory: shallow breaths, clear. GI: Soft, nontender, non-distended  MS: No edema  Assessment & Plan   Chest pain, atypical Severe LVH Elevated troponin Patient presented with abdominal pain, dark stools Diagnosed with acute upper GI bleed Cardiology asked to consult in setting of brief chest pain, elevated troponin levels Troponin level 121 ? 127  EKG consistent with LVH,  showed diffuse TWI unchanged from prior tracing  Echo this admission: EF > 75%, no RWMA, severe LVH, normal RV systolic function, no significant valvular abnormalities, normal IVC Patient unable to provide much history or answer any questions  Do not suspect any additional cardiac testing is required this admission Continued treatment of upper GI bleed, gastritis, esophageal candidiasis, AKI per primary   Hypertension  BP low-normal this admission  Currently on PTA Toprol  50 mg daily Holding PTA amlodipine    Hyperlipidemia 05/24/2024: HDL 68; LDL Chol Calc (NIH) 98 08/13/2024: ALT 20  Continue Lipitor 10 mg daily   For questions or updates, please contact [facility] Please consult [email or web address] for contact info under       Signed, Waddell DELENA Donath, PA-C   "

## 2024-08-15 NOTE — Plan of Care (Signed)

## 2024-08-15 NOTE — Progress Notes (Signed)
 Patient expressed desire to leave AMA. Patient noted to be drowsy and unsteady likely secondary to administration of ativan . Patient unable to answer questions appropriately. Patient advised that it was not safe to go home at this time due to medication effects and risk for injury and fall. Provider and CN notified of patient's intent to leave AMA and current mental status. Patient assisted back to the bed but kept on getting up after several minutes. PRN ativan  given.

## 2024-08-15 NOTE — Progress Notes (Signed)
 [facility] Gastroenterology Progress Note  Jennifer [name] 64 y.o. May 17, 1961   Subjective: Confused. Sleeping in bed.  Objective: Vital signs: Vitals:   08/15/24 0556 08/15/24 1254  BP: 106/64 (!) 142/88  Pulse: 93 94  Resp:  20  Temp:  98 F (36.7 C)  SpO2:  97%    Physical Exam: Gen: somnolent, well-nourished, no acute distress  HEENT: anicteric sclera CV: RRR Chest: CTA B Abd: diffuse tenderness with guarding, soft, nondistended, +BS Ext: no edema  Lab Results: Recent Labs    08/14/24 0431 08/15/24 0345  NA 141 142  K 4.4 4.7  CL 108 107  CO2 22 23  GLUCOSE 115* 95  BUN 40* 27*  CREATININE 1.74* 1.09*  CALCIUM  9.6 9.9  MG 2.4  --    Recent Labs    08/13/24 2025  AST 37  ALT 20  ALKPHOS 60  BILITOT 0.4  PROT 8.0  ALBUMIN 4.3   Recent Labs    08/13/24 2025 08/14/24 0431 08/14/24 1436 08/15/24 0345  WBC 10.8* 8.1  --  8.6  NEUTROABS 8.5*  --   --   --   HGB 14.4 12.6 12.5 13.0  HCT 44.0 38.8 38.6 39.4  MCV 90.3 89.4  --  89.3  PLT 209 164  --  149*      Assessment/Plan: Gastric ulcer bleed - no signs of ongoing bleeding. Cytology pending but ok to start Diflucan  empirically. Slowly advance diet when mental status better. Ok to change to PO PPI BID when tolerating soft diet. Avoid NSAIDs. Will sign off. Call if questions. F/U with [facility] in 6-8 weeks.    Jennifer [name] 08/15/2024, 1:39 PM  Questions please call [name] Na, female   DOB: May 08, 1961, 64 y.o.   MRN: [id number]

## 2024-08-15 NOTE — Progress Notes (Signed)
 Pt was found attempting to ambulate to the bathroom x3 times this shift and urinated on floor prior to making it. Pt became aggressive when this nurse attempted to assist patient either safely to bathroom if she still had to go or back to bed. Pt kept pushing this RN saying  don't touch me as this RN only had a hold of IV pole to guide her to the bathroom. This RN called for assistance as pt was increasingly getting agitated as she rose her hand to swing. This RN expressed to pt that I was here for safety to make sure she did not fall as she was very unsteady. Pt expressed, excuse me, mind your business as she still attempted to push RN out the threshold of the bathroom. This RN expressed that privacy could be given once she was safely seated. With the help of another RN and a lot of coaching, pt finally sat on toilet. Pt then kept repeating that she was leaving as she said at the start of shift. Dr. Sebastian was notified along with charge RN of patients constant request to leave AMA although lethargic and unsteady.

## 2024-08-15 NOTE — Progress Notes (Signed)
 " PROGRESS NOTE    Jennifer [name]  FMW:[id number] DOB: [birth year] DOA: 08/13/2024 PCP: Delbert Clam, MD    Chief Complaint  Patient presents with   Melena    Brief Narrative:  Jennifer [name] is a 64 y.o. female with medical history significant for HTN, BPD, substance use disorder, tobacco use who presented to the ED for evaluation of abdominal pain and black stools.   Patient was initially admitted on 08/12/2024 with melena secondary to upper GI bleeding.  Hemoglobin was 14.1.  She was seen by GI and underwent EGD morning of 08/13/2024.  This showed esophageal plaques suspicious for candidiasis, nonbleeding gastric ulcer with pigmented material, and gastritis.  Patient ultimately left AMA after the EGD.  It was noted that creatinine worsened from 1.19 to 1.63 on repeat morning labs.  On repeat labs in the ED creatinine noted to go up as high as 2.66.   Assessment & Plan:   Principal Problem:   AKI (acute kidney injury) Active Problems:   Acute upper GI bleeding   Essential hypertension   Esophageal candidiasis (HCC)   Gastric ulcer   Elevated troponin   Chest pain at rest  #1 melena/upper GI bleed secondary to gastric ulcer/gastritis -Patient admitted on 08/12/2024 with concerns for upper GI bleed. - Patient subsequently underwent EGD 08/13/2024 that showed esophageal plaques suspicious for candidiasis, large nonbleeding gastric ulcer with pigmented material and gastritis. - Patient noted to be on Aleve  nightly. - Hemoglobin noted at 14.4 on admission down to 13.0 today. - Continue IV PPI every 12 hours and when more alert will transition to oral PPI twice daily.. - Patient with improved epigastric and abdominal pain.   - No further melanotic stools reported.   - BP improving.  -- CT chest abdomen and pelvis done on admission with no acute findings, no evidence of bowel perforation, gastric wall thickening in the distal stomach similar to prior study.  Esophageal dilation suggest  chronic esophageal dysmotility such as achalasia, similar to prior studies.  Nonobstructing 3 mm right lower pole renal stone.  No hydronephrosis or hydroureter. -Abdominal films unremarkable and negative for perforation. -Patient advised to avoid NSAIDs. -Advance diet as tolerated when more alert. -GI consulted following and recommending to change PPI to p.o. twice daily when tolerating soft diet. -Will need outpatient follow-up with [facility] in 6 to 8 weeks. -GI following and are signing off as of today, 08/15/2024.  Addendum.  Per GI hold off on CT angiogram abdomen and repeat CBC this afternoon if worsening hemoglobin, worsening hemodynamics and ongoing melena are recommending CT angiogram abdomen at that time.  2.  Esophageal candidiasis -EGD suspicious for esophageal candidiasis. - Pathology pending. - Patient was on IV Diflucan  200 mg daily, renal dosing, renal function improvement will increase IV Diflucan  to 400 mg daily and patient will need to complete a 2-week course of treatment.  - Currently on clear liquids.  - Per GI.    3.  Acute kidney injury -Likely secondary to a prerenal azotemia in the setting of melanotic stools, decreased p.o. intake, NSAID use and patient noted to be on lisinopril  HCTZ prior to admission. - Creatinine on admission noted at 2.66 with prior creatinine of 1.19 on 08/13/2024. - Urinalysis with small hemoglobin, nitrite negative, leukocytes negative, 100 of protein, rare bacteria.   - Urine sodium noted to 48, urine creatinine of 244.  - Renal ultrasound with no acute findings, inferior pole right kidney stone measuring 5 mm. - Creatinine trending  down with hydration currently at 1.09 from 1.74 from 2.66 on admission. -Continue IV fluids.  4.  Elevated troponin -Troponins noted elevated at 121>>> 127. - Patient presented with complaints of chest pain but when asked to clarify and noted to be pointing more in the upper abdominal region and diffusely. - CT  chest abdomen and pelvis done on admission with no acute findings, no evidence of bowel perforation, gastric wall thickening in the distal stomach similar to prior study.  Esophageal dilation suggest chronic esophageal dysmotility such as achalasia, similar to prior studies.  Nonobstructing 3 mm right lower pole renal stone.  No hydronephrosis or hydroureter. - EKG done with diffuse T wave inversions, nonspecific ST depressions unchanged from prior EKG. - Likely elevated troponin secondary to demand ischemia in the setting of GI bleed, dehydration and AKI. - 2D echo with a EF > 75%, NWMA, severe left ventricular hypertrophy of the apical segment.  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  Recommend cardiac MRI as outpatient for further evaluation of apical HCM. - Cardiology consulted and following and do not feel any additional cardiac testing required during this hospitalization..  5.  Hypertension -Patient with soft blood pressures which have improved since admission.. - Continue to hold antihypertensive medications and can likely resume tomorrow..  6.  History of substance use disorder/history of alcohol use -Patient reported occasional alcohol use, last use about 5 days prior to admission. - Patient also with history of recreational drug use but denied any recent use. - UDS pending. - Patient was placed on Ativan  withdrawal protocol however patient significantly drowsy after receiving IV Ativan  overnight.   - Discontinue IV Ativan  and follow.    7.  Tobacco use -Tobacco cessation - Patient noted on admission to declined nicotine patch.   DVT prophylaxis: SCDs Code Status: Full Family Communication: Updated patient.  No family at bedside. Disposition: Likely home when clinically improved and cleared by gastroenterology and cardiology.    Status is: Inpatient    Consultants:  Gastroenterology: Dr. Dianna 08/14/2024 Cardiology: Dr. Leafy  08/14/2024  Procedures:  Chest x-ray 08/13/2024 Abdominal films 08/14/2024 Renal ultrasound 08/14/2024 2D echo 08/14/2024   Antimicrobials:  Anti-infectives (From admission, onward)    Start     Dose/Rate Route Frequency Ordered Stop   08/16/24 0600  fluconazole  (DIFLUCAN ) IVPB 400 mg        400 mg 100 mL/hr over 120 Minutes Intravenous Every 24 hours 08/15/24 0824     08/15/24 0500  fluconazole  (DIFLUCAN ) IVPB 200 mg  Status:  Discontinued        200 mg 100 mL/hr over 60 Minutes Intravenous Every 24 hours 08/14/24 0128 08/15/24 0824   08/14/24 0200  fluconazole  (DIFLUCAN ) IVPB 400 mg        400 mg 100 mL/hr over 120 Minutes Intravenous  Once 08/14/24 0144 08/14/24 1328         Subjective: Was called by RN that patient threatening to leave AMA.  Went and assessed patient patient significantly drowsy noted to have received IV Ativan  2 mg at 5:52 AM.  Patient denies any chest pain or shortness of breath.  States abdominal pain has improved.  Tolerating clear liquids.  Wanting to leave.   Objective: Vitals:   08/15/24 0250 08/15/24 0300 08/15/24 0511 08/15/24 0556  BP: 123/78 110/72 (!) 100/56 106/64  Pulse: 98 98 92 93  Resp:   19   Temp:   98.8 F (37.1 C)   TempSrc:  SpO2:   91%   Weight:      Height:        Intake/Output Summary (Last 24 hours) at 08/15/2024 0841 Last data filed at 08/15/2024 0323 Gross per 24 hour  Intake 1585.48 ml  Output --  Net 1585.48 ml   Filed Weights   08/14/24 0703  Weight: 81.8 kg    Examination:  General exam: Drowsy Respiratory system: CTAB.  No wheezes, no crackles, no rhonchi.  Fair air movement.  Speaking in full sentences.  No use of accessory muscles of respiration.  Cardiovascular system: Regular rate and rhythm no murmurs rubs or gallops.  No JVD.  No lower extremity edema.  Gastrointestinal system: Abdomen is soft, nontender, nondistended, positive bowel sounds.  No rebound.  No guarding.  Positive bowel sounds.   Central  nervous system: Alert and oriented. No focal neurological deficits. Extremities: Symmetric 5 x 5 power. Skin: No rashes, lesions or ulcers Psychiatry: Judgement and insight appear normal. Mood & affect appropriate.     Data Reviewed: I have personally reviewed following labs and imaging studies  CBC: Recent Labs  Lab 08/12/24 0814 08/12/24 1838 08/13/24 0519 08/13/24 2025 08/14/24 0431 08/14/24 1436 08/15/24 0345  WBC 7.9  --  7.6 10.8* 8.1  --  8.6  NEUTROABS  --   --   --  8.5*  --   --   --   HGB 15.7*   < > 13.8 14.4 12.6 12.5 13.0  HCT 47.4*   < > 42.5 44.0 38.8 38.6 39.4  MCV 89.1  --  88.9 90.3 89.4  --  89.3  PLT 246  --  195 209 164  --  149*   < > = values in this interval not displayed.    Basic Metabolic Panel: Recent Labs  Lab 08/12/24 0814 08/13/24 0519 08/13/24 2025 08/14/24 0431 08/15/24 0345  NA 140 140 143 141 142  K 3.7 3.9 4.2 4.4 4.7  CL 104 104 104 108 107  CO2 24 24 26 22 23   GLUCOSE 116* 104* 107* 115* 95  BUN 29* 38* 41* 40* 27*  CREATININE 1.19* 1.63* 2.66* 1.74* 1.09*  CALCIUM  10.7* 10.2 10.7* 9.6 9.9  MG  --   --   --  2.4  --     GFR: Estimated Creatinine Clearance: 56 mL/min (A) (by C-G formula based on SCr of 1.09 mg/dL (H)).  Liver Function Tests: Recent Labs  Lab 08/12/24 0814 08/13/24 2025  AST 21 37  ALT 17 20  ALKPHOS 63 60  BILITOT 0.6 0.4  PROT 7.8 8.0  ALBUMIN 4.3 4.3    CBG: Recent Labs  Lab 08/12/24 0808  GLUCAP 110*     Recent Results (from the past 240 hours)  Surgical PCR screen     Status: None   Collection Time: 08/13/24  3:31 AM   Specimen: Nasal Mucosa; Nasal Swab  Result Value Ref Range Status   MRSA, PCR NEGATIVE NEGATIVE Final   Staphylococcus aureus NEGATIVE NEGATIVE Final    Comment: (NOTE) The Xpert SA Assay (FDA approved for NASAL specimens in patients 35 years of age and older), is one component of a comprehensive surveillance program. It is not intended to diagnose infection nor  to guide or monitor treatment. Performed at [facility], [address], [place], [place] [address]          Radiology Studies: ECHOCARDIOGRAM COMPLETE Result Date: 08/14/2024    ECHOCARDIOGRAM REPORT   Patient Name:  [name] NA Date of Exam: 08/14/2024 Medical Rec #:  [id number]         Height:       64.0 in Accession #:    [id number]        Weight:       180.3 lb Date of Birth:  [birth year]          BSA:          1.872 m Patient Age:    63 years          BP:           105/56 mmHg Patient Gender: F                 HR:           100 bpm. Exam Location:  Inpatient Procedure: 2D Echo, Cardiac Doppler and Color Doppler (Both Spectral and Color            Flow Doppler were utilized during procedure). Indications:    Chest Pain R07.9  History:        Patient has no prior history of Echocardiogram examinations.                 LVH; Risk Factors:Hypertension.  Sonographer:    Tinnie Gosling RDCS Referring Phys: [phone] Ogle Hoeffner V Yunus Stoklosa IMPRESSIONS  1. Left ventricular ejection fraction, by estimation, is >75%. The left ventricle has hyperdynamic function. The left ventricle has no regional wall motion abnormalities. There is severe left ventricular hypertrophy of the apical segment. Left ventricular diastolic parameters are indeterminate.  2. Right ventricular systolic function is normal. The right ventricular size is normal. Tricuspid regurgitation signal is inadequate for assessing PA pressure.  3. The mitral valve is normal in structure. Trivial mitral valve regurgitation. No evidence of mitral stenosis.  4. The aortic valve has an indeterminant number of cusps. Aortic valve regurgitation is not visualized. No aortic stenosis is present.  5. The inferior vena cava is normal in size with greater than 50% respiratory variability, suggesting right atrial pressure of 3 mmHg. Comparison(s): No prior Echocardiogram. 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 Recommend cardiac MRI as an outpatient for further evaluation of apical HCM. The velocity through the aortic valve is elevated, but this likely represents an increased LV gradient from HCM rather than aortic stenosis. Recommend repeat echocardiogram in ~2-3 years to make sure. FINDINGS  Left Ventricle: Left ventricular ejection fraction, by estimation, is >75%. The left ventricle has hyperdynamic function. The left ventricle has no regional wall motion abnormalities. The left ventricular internal cavity size was small. There is severe left ventricular hypertrophy of the apical segment. Left ventricular diastolic parameters are indeterminate. Right Ventricle: The right ventricular size is normal. No increase in right ventricular wall thickness. Right ventricular systolic function is normal. Tricuspid regurgitation signal is inadequate for assessing PA pressure. Left Atrium: Left atrial size was normal in size. Right Atrium: Right atrial size was normal in size. Pericardium: There is no evidence of pericardial effusion. Mitral Valve: The mitral valve is normal in structure. Trivial mitral valve regurgitation. No evidence of mitral valve stenosis. Tricuspid Valve: The tricuspid valve is normal in structure. Tricuspid valve regurgitation is not demonstrated. No evidence of tricuspid stenosis. Aortic Valve: The aortic valve has an indeterminant number of cusps. Aortic valve regurgitation is not visualized. No aortic stenosis is present. Aortic valve mean gradient measures 10.0 mmHg. Aortic valve peak gradient measures 19.0 mmHg. Aortic valve area, by VTI  measures 3.53 cm. Pulmonic Valve: The pulmonic valve was normal in structure. Pulmonic valve regurgitation is not visualized. No evidence of pulmonic stenosis. Aorta: The aortic root and ascending aorta are structurally normal, with no evidence of dilitation. Venous: The inferior vena cava is normal in size with greater than 50%  respiratory variability, suggesting right atrial pressure of 3 mmHg. IAS/Shunts: No atrial level shunt detected by color flow Doppler.  LEFT VENTRICLE PLAX 2D LVIDd:         4.60 cm   Diastology LVIDs:         2.20 cm   LV e' lateral:   9.03 cm/s LV PW:         1.40 cm   LV E/e' lateral: 9.7 LV IVS:        1.30 cm LVOT diam:     2.20 cm LV SV:         100 LV SV Index:   53 LVOT Area:     3.80 cm  RIGHT VENTRICLE         IVC TAPSE (M-mode): 2.3 cm  IVC diam: 1.20 cm LEFT ATRIUM             Index        RIGHT ATRIUM           Index LA diam:        3.30 cm 1.76 cm/m   RA Area:     12.90 cm LA Vol (A2C):   64.8 ml 34.61 ml/m  RA Volume:   27.00 ml  14.42 ml/m LA Vol (A4C):   46.4 ml 24.78 ml/m LA Biplane Vol: 54.9 ml 29.32 ml/m  AORTIC VALVE AV Area (Vmax):    3.38 cm AV Area (Vmean):   3.91 cm AV Area (VTI):     3.53 cm AV Vmax:           218.00 cm/s AV Vmean:          145.000 cm/s AV VTI:            0.283 m AV Peak Grad:      19.0 mmHg AV Mean Grad:      10.0 mmHg LVOT Vmax:         194.00 cm/s LVOT Vmean:        149.000 cm/s LVOT VTI:          0.263 m LVOT/AV VTI ratio: 0.93  AORTA Ao Root diam: 2.90 cm Ao Asc diam:  3.40 cm MITRAL VALVE MV Area (PHT): 6.88 cm    SHUNTS MV E velocity: 87.90 cm/s  Systemic VTI:  0.26 m MV A velocity: 87.30 cm/s  Systemic Diam: 2.20 cm MV E/A ratio:  1.01 Georganna Archer Electronically signed by Georganna Archer Signature Date/Time: 08/14/2024/3:27:18 PM    Final    DG Abd Portable 1V Result Date: 08/14/2024 EXAM: 1 VIEW XRAY OF THE ABDOMEN 08/14/2024 09:11:00 AM COMPARISON: CT chest abdomen and pelvis 08/13/2024. CLINICAL HISTORY: Abdominal pain FINDINGS: BOWEL: There is a paucity of bowel gas within the abdomen and pelvis, which limits assessment for underlying bowel pathology, specifically bowel obstruction. Within this limitation, no dilated bowel loops are noted. The paucity of bowel gas could represent a non-obstructive process, early or incomplete obstruction not yet  causing significant dilation, or reduced bowel motility. SOFT TISSUES: A previous 3 mm right lower pole renal calculus is not currently seen. BONES: No acute fracture. IMPRESSION: 1. Paucity of bowel gas limits assessment for bowel obstruction, with no  dilated bowel loops identified. 2. A previous 3 mm right lower pole renal calculus is not currently seen. This could indicate it has passed, is too small to be visualized on plain film, or is obscured. If there is a clinical concern for ureteral calculi, particularly if the patient has renal colic, consider further evaluation with a renal stone protocol CT. Electronically signed by: Waddell Calk MD 08/14/2024 09:18 AM EST RP Workstation: [id number]   US  RENAL Result Date: 08/14/2024 EXAM: US  Retroperitoneum Complete, Renal. 08/14/2024 07:14:38 AM TECHNIQUE: Real-time ultrasonography of the retroperitoneum renal was performed. COMPARISON: CT 08/13/2024 CLINICAL HISTORY: 409830 AKI (acute kidney injury) 409830 AKI (acute kidney injury) FINDINGS: FINDINGS: RIGHT KIDNEY/URETER: The right kidney measures 11.2 x 5.3 x 6.1 cm with a volume of 187.6 cc. Normal cortical echogenicity. No hydronephrosis. Inferior pole right kidney stone measures 5 mm. No mass. LEFT KIDNEY/URETER: Left kidney measures 11.0 x 5.3 x 5.9 cm with a volume of 180.4 cc. Normal cortical echogenicity. No hydronephrosis. No calculus. No mass. BLADDER: Urinary bladder is decompressed. Patient voided prior to exam. IMPRESSION: 1. No acute findings. 2. Inferior pole right kidney stone measuring 5 mm. Electronically signed by: Waddell Calk MD 08/14/2024 07:21 AM EST RP Workstation: [id number]   CT CHEST ABDOMEN PELVIS WO CONTRAST Result Date: 08/13/2024 EXAM: CT CHEST, ABDOMEN AND PELVIS WITHOUT CONTRAST 08/13/2024 11:46:00 PM TECHNIQUE: CT of the chest, abdomen and pelvis was performed without the administration of intravenous contrast. Multiplanar reformatted images are provided for review. Automated  exposure control, iterative reconstruction, and/or weight based adjustment of the mA/kV was utilized to reduce the radiation dose to as low as reasonably achievable. COMPARISON: Chest radiograph 08/13/2024, CT abdomen and pelvis 08/12/2024, and CT chest 08/10/2023. CLINICAL HISTORY: Abdominal pain, acute, nonlocalized; post EGD and has severe pain. FINDINGS: CHEST: MEDIASTINUM AND LYMPH NODES: Heart and pericardium are unremarkable. The central airways are clear. No mediastinal, hilar or axillary lymphadenopathy. Diffusely dilated esophagus filled with air and fluid. This is similar to the prior studies and likely represents chronic dysmotility such as achalasia. LUNGS AND PLEURA: 4 mm right lower lung nodule, series 4 image 159. No change since prior study. No imaging follow-up is indicated by size criteria. No focal consolidation or pulmonary edema. No pleural effusion. No pneumothorax. ABDOMEN AND PELVIS: LIVER: Unremarkable. GALLBLADDER AND BILE DUCTS: Increased density throughout the gallbladder likely representing vicarious contrast excretion. No bile duct dilatation. SPLEEN: No acute abnormality. PANCREAS: No acute abnormality. ADRENAL GLANDS: No acute abnormality. KIDNEYS, URETERS AND BLADDER: Right lower pole renal stone measuring 3 mm in diameter. No hydronephrosis or hydroureter. No perinephric or periureteral stranding. Residual contrast material in the bladder. No bladder wall thickening. GI AND BOWEL: The stomach is partially decompressed, but there is evidence of gastric wall thickening in the distal stomach, similar to prior study. Residual contrast material demonstrated in the colon. The small bowel and colon are decompressed. The appendix is normal. There is no bowel obstruction. REPRODUCTIVE ORGANS: The uterus appears surgically absent. No abnormal adnexal masses. PERITONEUM AND RETROPERITONEUM: No free air or free fluid in the abdomen or pelvis to suggest perforation. VASCULATURE: Calcification  of the aorta. Aorta is normal in caliber. ABDOMINAL AND PELVIS LYMPH NODES: No lymphadenopathy. BONES AND SOFT TISSUES: Mild degenerative changes in the spine. Slight anterior subluxation of L4 on L5, likely degenerative. No change since prior study. No acute osseous abnormality. No focal soft tissue abnormality. IMPRESSION: 1. No acute findings. No evidence of bowel perforation . 2. Gastric wall thickening in  the distal stomach, similar to prior study. 3. Esophageal dilatation suggests chronic esophageal dysmotility such as achalasia, similar to prior studies. 4. Nonobstructing 3 mm right lower pole renal stone. No hydronephrosis or hydroureter. Electronically signed by: Elsie Gravely MD 08/13/2024 11:55 PM EST RP Workstation: [id number]   DG Chest Port 1 View Result Date: 08/13/2024 EXAM: 1 VIEW(S) XRAY OF THE CHEST 08/13/2024 09:03:00 PM COMPARISON: CT chest 08/10/2023. CLINICAL HISTORY: Abdominal pain. FINDINGS: LUNGS AND PLEURA: No focal pulmonary opacity. No vascular congestion, edema, or consolidation. No pleural effusion. No pneumothorax. HEART AND MEDIASTINUM: Mild cardiac enlargement. Mediastinal contours appear intact. Calcification of the aorta. BONES AND SOFT TISSUES: No acute osseous abnormality. IMPRESSION: 1. No acute cardiopulmonary findings. Electronically signed by: Elsie Gravely MD 08/13/2024 09:06 PM EST RP Workstation: [id number]        Scheduled Meds:  folic acid   1 mg Oral Daily   LORazepam   0-4 mg Intravenous Q6H   Followed by   NOREEN ON 08/16/2024] LORazepam   0-4 mg Intravenous Q12H   metoprolol  succinate  50 mg Oral Daily   multivitamin with minerals  1 tablet Oral Daily   pantoprazole  (PROTONIX ) IV  40 mg Intravenous Q12H   senna-docusate  1 tablet Oral QHS   sodium chloride  flush  3 mL Intravenous Q12H   thiamine   100 mg Oral Daily   Or   thiamine   100 mg Intravenous Daily   Continuous Infusions:  [START ON 08/16/2024] fluconazole  (DIFLUCAN ) IV     lactated  ringers        LOS: 1 day    Time spent: 45 minutes    Toribio Hummer, MD [facility]   To contact the attending provider between 7A-7P or the covering provider during after hours 7P-7A, please log into the web site [email or web address] and access using universal [place] password for that web site. If you do not have the password, please call the hospital operator.  08/15/2024, 8:41 AM    "

## 2024-08-16 DIAGNOSIS — N179 Acute kidney failure, unspecified: Secondary | ICD-10-CM | POA: Diagnosis not present

## 2024-08-16 DIAGNOSIS — R7989 Other specified abnormal findings of blood chemistry: Secondary | ICD-10-CM | POA: Diagnosis not present

## 2024-08-16 DIAGNOSIS — I10 Essential (primary) hypertension: Secondary | ICD-10-CM | POA: Diagnosis not present

## 2024-08-16 DIAGNOSIS — B3781 Candidal esophagitis: Secondary | ICD-10-CM | POA: Diagnosis not present

## 2024-08-16 LAB — BASIC METABOLIC PANEL WITH GFR
Anion gap: 12 (ref 5–15)
BUN: 27 mg/dL — ABNORMAL HIGH (ref 8–23)
CO2: 21 mmol/L — ABNORMAL LOW (ref 22–32)
Calcium: 10 mg/dL (ref 8.9–10.3)
Chloride: 109 mmol/L (ref 98–111)
Creatinine, Ser: 1 mg/dL (ref 0.44–1.00)
GFR, Estimated: >60 mL/min
Glucose, Bld: 99 mg/dL (ref 70–99)
Potassium: 5 mmol/L (ref 3.5–5.1)
Sodium: 142 mmol/L (ref 135–145)

## 2024-08-16 LAB — CBC
HCT: 36.8 % (ref 36.0–46.0)
Hemoglobin: 12.1 g/dL (ref 12.0–15.0)
MCH: 29.6 pg (ref 26.0–34.0)
MCHC: 32.9 g/dL (ref 30.0–36.0)
MCV: 90 fL (ref 80.0–100.0)
Platelets: 131 K/uL — ABNORMAL LOW (ref 150–400)
RBC: 4.09 MIL/uL (ref 3.87–5.11)
RDW: 14.7 % (ref 11.5–15.5)
WBC: 8.1 K/uL (ref 4.0–10.5)
nRBC: 0 % (ref 0.0–0.2)

## 2024-08-16 NOTE — Progress Notes (Signed)
 Transition of Care [facility]) - Inpatient Brief Assessment   Patient Details  Name: JENNAE HAKEEM MRN: [id number] Date of Birth: September 17, 1960  Transition of Care [facility]) CM/SW Contact:    Tawni CHRISTELLA Eva, LCSW Phone Number: 08/16/2024, 4:08 PM   Clinical Narrative:  CSW consulted for substance abuse. CSW attached substance use resources to the pts AVS.  Transition of Care Asessment: Insurance and Status: Insurance coverage has been reviewed Patient has primary care physician: Yes Home environment has been reviewed: home with self Prior level of function:: independent Prior/Current Home Services: No current home services Social Drivers of Health Review: SDOH reviewed no interventions necessary Readmission risk has been reviewed: Yes Transition of care needs: no transition of care needs at this time

## 2024-08-16 NOTE — Progress Notes (Signed)
 " PROGRESS NOTE    EYVETTE [name]  FMW:[id number] DOB: 27-Sep-1960 DOA: 08/13/2024 PCP: Delbert Clam, MD    Chief Complaint  Patient presents with   Melena    Brief Narrative:  Jennifer [name] is a 64 y.o. female with medical history significant for HTN, BPD, substance use disorder, tobacco use who presented to the ED for evaluation of abdominal pain and black stools.   Patient was initially admitted on 08/12/2024 with melena secondary to upper GI bleeding.  Hemoglobin was 14.1.  She was seen by GI and underwent EGD morning of 08/13/2024.  This showed esophageal plaques suspicious for candidiasis, nonbleeding gastric ulcer with pigmented material, and gastritis.  Patient ultimately left AMA after the EGD.  It was noted that creatinine worsened from 1.19 to 1.63 on repeat morning labs.  On repeat labs in the ED creatinine noted to go up as high as 2.66. Patient admitted, placed on IV fluids with resolution of AKI.  Hemoglobin remained stable.  Patient on IV Diflucan , IV PPI.  GI was consulted.   Assessment & Plan:   Principal Problem:   AKI (acute kidney injury) Active Problems:   Acute upper GI bleeding   Essential hypertension   Esophageal candidiasis (HCC)   Gastric ulcer   Elevated troponin   Chest pain at rest  #1 melena/upper GI bleed secondary to gastric ulcer/gastritis -Patient admitted on 08/12/2024 with concerns for upper GI bleed. - Patient subsequently underwent EGD 08/13/2024 that showed esophageal plaques suspicious for candidiasis, large nonbleeding gastric ulcer with pigmented material and gastritis. - Patient noted to be on Aleve  nightly. - Hemoglobin noted at 14.4 on admission down to 13.0 today. - Continue IV PPI every 12 hours and when more alert will transition to oral PPI twice daily.. - Patient with improved epigastric and abdominal pain.   - No further melanotic stools reported.   - BP improving.  -- CT chest abdomen and pelvis done on admission with no acute  findings, no evidence of bowel perforation, gastric wall thickening in the distal stomach similar to prior study.  Esophageal dilation suggest chronic esophageal dysmotility such as achalasia, similar to prior studies.  Nonobstructing 3 mm right lower pole renal stone.  No hydronephrosis or hydroureter. -Abdominal films unremarkable and negative for perforation. -Patient advised to avoid NSAIDs. -Diet advanced to soft diet however patient very apprehensive and only taking clear liquids at this time, -GI consulted following and recommending to change PPI to p.o. twice daily when tolerating soft diet. -Will need outpatient follow-up with [facility] in 6 to 8 weeks. -GI following and signed off as of 08/15/2024.   2.  Esophageal candidiasis -EGD suspicious for esophageal candidiasis. - Pathology pending. - Patient was on IV Diflucan  200 mg daily, renal dosing, renal function improved and IV Diflucan  increased to 400 mg daily.  - Would likely transition to oral Diflucan  on discharge and will need to complete a 2-week course of treatment.  - Patient tolerating clear liquids, diet was advanced to a soft diet however patient apprehensive at this time due to concerns for significant abdominal pain and is currently only taking clear liquids at this time.  - Patient encouraged to try soft diet at the minimum of full liquid diet.  - Patient was being followed by GI who signed off as of 08/15/2024.    3.  Acute kidney injury -Likely secondary to a prerenal azotemia in the setting of melanotic stools, decreased p.o. intake, NSAID use and patient noted to  be on lisinopril  HCTZ prior to admission. - Creatinine on admission noted at 2.66 with prior creatinine of 1.19 on 08/13/2024. - Urinalysis with small hemoglobin, nitrite negative, leukocytes negative, 100 of protein, rare bacteria.   - Urine sodium noted to 48, urine creatinine of 244.  - Renal ultrasound with no acute findings, inferior pole right kidney stone  measuring 5 mm. - Creatinine trending down with hydration currently at 1.00 from 1.09 from 1.74 from 2.66 on admission. - Saline lock IV fluids.  4.  Elevated troponin -Troponins noted elevated at 121>>> 127. - Patient presented with complaints of chest pain but when asked to clarify and noted to be pointing more in the upper abdominal region and diffusely. - CT chest abdomen and pelvis done on admission with no acute findings, no evidence of bowel perforation, gastric wall thickening in the distal stomach similar to prior study.  Esophageal dilation suggest chronic esophageal dysmotility such as achalasia, similar to prior studies.  Nonobstructing 3 mm right lower pole renal stone.  No hydronephrosis or hydroureter. - EKG done with diffuse T wave inversions, nonspecific ST depressions unchanged from prior EKG. - Likely elevated troponin secondary to demand ischemia in the setting of GI bleed, dehydration and AKI. - 2D echo with a EF > 75%, NWMA, severe left ventricular hypertrophy of the apical segment.  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  Recommend cardiac MRI as outpatient for further evaluation of apical HCM. - Cardiology consulted and following and do not feel any additional cardiac testing required during this hospitalization..  5.  Hypertension -Patient with soft blood pressures which have improved since admission.. - Continue to hold antihypertensive medications and can likely resume if blood pressure improves tomorrow.    6.  History of substance use disorder/history of alcohol use -Patient reported occasional alcohol use, last use about 5 days prior to admission. - Patient also with history of recreational drug use but denied any recent use. - UDS was noted for cocaine. - Patient was placed on Ativan  withdrawal protocol however patient significantly drowsy after receiving IV Ativan .   - IV Ativan  discontinued.   7.  Tobacco use -Tobacco  cessation - Patient noted on admission to declined nicotine patch.   DVT prophylaxis: SCDs Code Status: Full Family Communication: Updated patient.  No family at bedside. Disposition: Likely home when clinically improved and tolerating soft diet with stabilization of hemoglobin hopefully in the next 24 to 48 hours.    Status is: Inpatient    Consultants:  Gastroenterology: Dr. Dianna 08/14/2024 Cardiology: Dr. Leafy 08/14/2024  Procedures:  Chest x-ray 08/13/2024 Abdominal films 08/14/2024 Renal ultrasound 08/14/2024 2D echo 08/14/2024   Antimicrobials:  Anti-infectives (From admission, onward)    Start     Dose/Rate Route Frequency Ordered Stop   08/16/24 0600  fluconazole  (DIFLUCAN ) IVPB 400 mg        400 mg 100 mL/hr over 120 Minutes Intravenous Every 24 hours 08/15/24 0824     08/15/24 0500  fluconazole  (DIFLUCAN ) IVPB 200 mg  Status:  Discontinued        200 mg 100 mL/hr over 60 Minutes Intravenous Every 24 hours 08/14/24 0128 08/15/24 0824   08/14/24 0200  fluconazole  (DIFLUCAN ) IVPB 400 mg        400 mg 100 mL/hr over 120 Minutes Intravenous  Once 08/14/24 0144 08/14/24 1328         Subjective: Patient sitting up in bed, somewhat tearful.  Patient with complaints of epigastric abdominal pain.  Patient  tolerating clear liquids.  Patient states she is scared to advance her diet due to significant abdominal pain.  Denies any chest pain or shortness of breath.   Objective: Vitals:   08/15/24 0511 08/15/24 0556 08/15/24 1254 08/16/24 0605  BP: (!) 100/56 106/64 (!) 142/88 107/65  Pulse: 92 93 94 83  Resp: 19  20 18   Temp: 98.8 F (37.1 C)  98 F (36.7 C) 98.3 F (36.8 C)  TempSrc:    Oral  SpO2: 91%  97% 92%  Weight:      Height:        Intake/Output Summary (Last 24 hours) at 08/16/2024 1056 Last data filed at 08/16/2024 0800 Gross per 24 hour  Intake 539.92 ml  Output 450 ml  Net 89.92 ml   Filed Weights   08/14/24 0703  Weight: 81.8 kg     Examination:  General exam: Alert. Respiratory system: Lungs clear to auscultation bilaterally.  No wheezes, no crackles, no rhonchi.  Fair air movement.  Speaking in full sentences.  No use of accessory muscles of respiration.  Cardiovascular system: RRR no murmurs rubs or gallops.  No JVD.  No lower extremity edema.  Gastrointestinal system: Abdomen is soft, tender to palpation in the epigastric region as well as diffusely.  Nondistended.  Positive bowel sounds.  No rebound.  No guarding.  Central nervous system: Alert and oriented. No focal neurological deficits. Extremities: Symmetric 5 x 5 power. Skin: No rashes, lesions or ulcers Psychiatry: Judgement and insight appear normal. Mood & affect appropriate.     Data Reviewed: I have personally reviewed following labs and imaging studies  CBC: Recent Labs  Lab 08/13/24 0519 08/13/24 2025 08/14/24 0431 08/14/24 1436 08/15/24 0345 08/16/24 0411  WBC 7.6 10.8* 8.1  --  8.6 8.1  NEUTROABS  --  8.5*  --   --   --   --   HGB 13.8 14.4 12.6 12.5 13.0 12.1  HCT 42.5 44.0 38.8 38.6 39.4 36.8  MCV 88.9 90.3 89.4  --  89.3 90.0  PLT 195 209 164  --  149* 131*    Basic Metabolic Panel: Recent Labs  Lab 08/13/24 0519 08/13/24 2025 08/14/24 0431 08/15/24 0345 08/16/24 0411  NA 140 143 141 142 142  K 3.9 4.2 4.4 4.7 5.0  CL 104 104 108 107 109  CO2 24 26 22 23  21*  GLUCOSE 104* 107* 115* 95 99  BUN 38* 41* 40* 27* 27*  CREATININE 1.63* 2.66* 1.74* 1.09* 1.00  CALCIUM  10.2 10.7* 9.6 9.9 10.0  MG  --   --  2.4  --   --     GFR: Estimated Creatinine Clearance: 61.1 mL/min (by C-G formula based on SCr of 1 mg/dL).  Liver Function Tests: Recent Labs  Lab 08/12/24 0814 08/13/24 2025  AST 21 37  ALT 17 20  ALKPHOS 63 60  BILITOT 0.6 0.4  PROT 7.8 8.0  ALBUMIN 4.3 4.3    CBG: Recent Labs  Lab 08/12/24 0808  GLUCAP 110*     Recent Results (from the past 240 hours)  Surgical PCR screen     Status: None    Collection Time: 08/13/24  3:31 AM   Specimen: Nasal Mucosa; Nasal Swab  Result Value Ref Range Status   MRSA, PCR NEGATIVE NEGATIVE Final   Staphylococcus aureus NEGATIVE NEGATIVE Final    Comment: (NOTE) The Xpert SA Assay (FDA approved for NASAL specimens in patients 67 years of age and older), is one  component of a comprehensive surveillance program. It is not intended to diagnose infection nor to guide or monitor treatment. Performed at [facility], [address] [place], [place] [address]          Radiology Studies: ECHOCARDIOGRAM COMPLETE Result Date: 08/14/2024    ECHOCARDIOGRAM REPORT   Patient Name:   EMMILY PELLEGRIN Date of Exam: 08/14/2024 Medical Rec #:  [id number]         Height:       64.0 in Accession #:    [id number]        Weight:       180.3 lb Date of Birth:  [birth year]          BSA:          1.872 m Patient Age:    63 years          BP:           105/56 mmHg Patient Gender: F                 HR:           100 bpm. Exam Location:  Inpatient Procedure: 2D Echo, Cardiac Doppler and Color Doppler (Both Spectral and Color            Flow Doppler were utilized during procedure). Indications:    Chest Pain R07.9  History:        Patient has no prior history of Echocardiogram examinations.                 LVH; Risk Factors:Hypertension.  Sonographer:    Tinnie Gosling RDCS Referring Phys: [phone] Jai Bear V Myrikal Messmer IMPRESSIONS  1. Left ventricular ejection fraction, by estimation, is >75%. The left ventricle has hyperdynamic function. The left ventricle has no regional wall motion abnormalities. There is severe left ventricular hypertrophy of the apical segment. Left ventricular diastolic parameters are indeterminate.  2. Right ventricular systolic function is normal. The right ventricular size is normal. Tricuspid regurgitation signal is inadequate for assessing PA pressure.  3. The mitral valve is normal in structure. Trivial mitral valve regurgitation. No evidence  of mitral stenosis.  4. The aortic valve has an indeterminant number of cusps. Aortic valve regurgitation is not visualized. No aortic stenosis is present.  5. The inferior vena cava is normal in size with greater than 50% respiratory variability, suggesting right atrial pressure of 3 mmHg. Comparison(s): No prior Echocardiogram. 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 Recommend cardiac MRI as an outpatient for further evaluation of apical HCM. The velocity through the aortic valve is elevated, but this likely represents an increased LV gradient from HCM rather than aortic stenosis. Recommend repeat echocardiogram in ~2-3 years to make sure. FINDINGS  Left Ventricle: Left ventricular ejection fraction, by estimation, is >75%. The left ventricle has hyperdynamic function. The left ventricle has no regional wall motion abnormalities. The left ventricular internal cavity size was small. There is severe left ventricular hypertrophy of the apical segment. Left ventricular diastolic parameters are indeterminate. Right Ventricle: The right ventricular size is normal. No increase in right ventricular wall thickness. Right ventricular systolic function is normal. Tricuspid regurgitation signal is inadequate for assessing PA pressure. Left Atrium: Left atrial size was normal in size. Right Atrium: Right atrial size was normal in size. Pericardium: There is no evidence of pericardial effusion. Mitral Valve: The mitral valve is normal in structure. Trivial mitral valve regurgitation. No evidence of mitral valve stenosis. Tricuspid Valve:  The tricuspid valve is normal in structure. Tricuspid valve regurgitation is not demonstrated. No evidence of tricuspid stenosis. Aortic Valve: The aortic valve has an indeterminant number of cusps. Aortic valve regurgitation is not visualized. No aortic stenosis is present. Aortic valve mean gradient measures 10.0 mmHg. Aortic  valve peak gradient measures 19.0 mmHg. Aortic valve area, by VTI measures 3.53 cm. Pulmonic Valve: The pulmonic valve was normal in structure. Pulmonic valve regurgitation is not visualized. No evidence of pulmonic stenosis. Aorta: The aortic root and ascending aorta are structurally normal, with no evidence of dilitation. Venous: The inferior vena cava is normal in size with greater than 50% respiratory variability, suggesting right atrial pressure of 3 mmHg. IAS/Shunts: No atrial level shunt detected by color flow Doppler.  LEFT VENTRICLE PLAX 2D LVIDd:         4.60 cm   Diastology LVIDs:         2.20 cm   LV e' lateral:   9.03 cm/s LV PW:         1.40 cm   LV E/e' lateral: 9.7 LV IVS:        1.30 cm LVOT diam:     2.20 cm LV SV:         100 LV SV Index:   53 LVOT Area:     3.80 cm  RIGHT VENTRICLE         IVC TAPSE (M-mode): 2.3 cm  IVC diam: 1.20 cm LEFT ATRIUM             Index        RIGHT ATRIUM           Index LA diam:        3.30 cm 1.76 cm/m   RA Area:     12.90 cm LA Vol (A2C):   64.8 ml 34.61 ml/m  RA Volume:   27.00 ml  14.42 ml/m LA Vol (A4C):   46.4 ml 24.78 ml/m LA Biplane Vol: 54.9 ml 29.32 ml/m  AORTIC VALVE AV Area (Vmax):    3.38 cm AV Area (Vmean):   3.91 cm AV Area (VTI):     3.53 cm AV Vmax:           218.00 cm/s AV Vmean:          145.000 cm/s AV VTI:            0.283 m AV Peak Grad:      19.0 mmHg AV Mean Grad:      10.0 mmHg LVOT Vmax:         194.00 cm/s LVOT Vmean:        149.000 cm/s LVOT VTI:          0.263 m LVOT/AV VTI ratio: 0.93  AORTA Ao Root diam: 2.90 cm Ao Asc diam:  3.40 cm MITRAL VALVE MV Area (PHT): 6.88 cm    SHUNTS MV E velocity: 87.90 cm/s  Systemic VTI:  0.26 m MV A velocity: 87.30 cm/s  Systemic Diam: 2.20 cm MV E/A ratio:  1.01 Georganna Archer Electronically signed by Georganna Archer Signature Date/Time: 08/14/2024/3:27:18 PM    Final         Scheduled Meds:  folic acid   1 mg Oral Daily   metoprolol  succinate  50 mg Oral Daily   multivitamin with  minerals  1 tablet Oral Daily   pantoprazole  (PROTONIX ) IV  40 mg Intravenous Q12H   senna-docusate  1 tablet Oral QHS   sodium chloride  flush  3 mL Intravenous Q12H   thiamine   100 mg Oral Daily   Or   thiamine   100 mg Intravenous Daily   Continuous Infusions:  fluconazole  (DIFLUCAN ) IV 400 mg (08/16/24 0612)     LOS: 2 days    Time spent: 40 minutes    Toribio Hummer, MD [facility]   To contact the attending provider between 7A-7P or the covering provider during after hours 7P-7A, please log into the web site [email or web address] and access using universal [place] password for that web site. If you do not have the password, please call the hospital operator.  08/16/2024, 10:56 AM    "

## 2024-08-16 NOTE — Progress Notes (Signed)
 Smell of smoke was noted coming from patients room. Asked patient if she had been smoking - patient denies smoking and states it was her sister. Sister was seen leaving the unit approx. 1 hour ago and has not been seen back on unit. Educated patient on facilities smoking policy and dangers of smoking in the hospital. Patient verbalized understanding. Advised patient that if smoke was smelled coming from her room again, security may have to be called to search room. Patient verbalized understanding.

## 2024-08-16 NOTE — Progress Notes (Signed)
 Patients intake of soft diet has minimal throughout day. Pain remains 8-10/10. Encouraged small bites throughout day but patient uninterested.

## 2024-08-17 DIAGNOSIS — N179 Acute kidney failure, unspecified: Secondary | ICD-10-CM | POA: Diagnosis not present

## 2024-08-17 LAB — CBC WITH DIFFERENTIAL/PLATELET
Abs Immature Granulocytes: 0.15 K/uL — ABNORMAL HIGH (ref 0.00–0.07)
Basophils Absolute: 0 K/uL (ref 0.0–0.1)
Basophils Relative: 0 %
Eosinophils Absolute: 0 K/uL (ref 0.0–0.5)
Eosinophils Relative: 0 %
HCT: 33.4 % — ABNORMAL LOW (ref 36.0–46.0)
Hemoglobin: 10.8 g/dL — ABNORMAL LOW (ref 12.0–15.0)
Immature Granulocytes: 2 %
Lymphocytes Relative: 9 %
Lymphs Abs: 0.8 K/uL (ref 0.7–4.0)
MCH: 29 pg (ref 26.0–34.0)
MCHC: 32.3 g/dL (ref 30.0–36.0)
MCV: 89.8 fL (ref 80.0–100.0)
Monocytes Absolute: 0.7 K/uL (ref 0.1–1.0)
Monocytes Relative: 8 %
Neutro Abs: 7.9 K/uL — ABNORMAL HIGH (ref 1.7–7.7)
Neutrophils Relative %: 81 %
Platelets: 141 K/uL — ABNORMAL LOW (ref 150–400)
RBC: 3.72 MIL/uL — ABNORMAL LOW (ref 3.87–5.11)
RDW: 14.7 % (ref 11.5–15.5)
Smear Review: NORMAL
WBC: 9.6 K/uL (ref 4.0–10.5)
nRBC: 0 % (ref 0.0–0.2)

## 2024-08-17 LAB — BASIC METABOLIC PANEL WITH GFR
Anion gap: 11 (ref 5–15)
BUN: 18 mg/dL (ref 8–23)
CO2: 23 mmol/L (ref 22–32)
Calcium: 9.7 mg/dL (ref 8.9–10.3)
Chloride: 108 mmol/L (ref 98–111)
Creatinine, Ser: 1 mg/dL (ref 0.44–1.00)
GFR, Estimated: >60 mL/min
Glucose, Bld: 118 mg/dL — ABNORMAL HIGH (ref 70–99)
Potassium: 4.6 mmol/L (ref 3.5–5.1)
Sodium: 142 mmol/L (ref 135–145)

## 2024-08-17 LAB — CYTOLOGY - NON PAP

## 2024-08-17 LAB — MAGNESIUM: Magnesium: 2.3 mg/dL (ref 1.7–2.4)

## 2024-08-17 NOTE — Progress Notes (Signed)
 " PROGRESS NOTE Jennifer [name]  FMW:[id number] DOB: 04/11/1961 DOA: 08/13/2024 PCP: Jennifer Clam, MD  Brief Narrative/Hospital Course: Jennifer [name] is a 64 y.o. female with PMH of  HTN, BPD, substance use disorder, tobacco use who presented to the ED for evaluation of abdominal pain and black stools. Patient was initially admitted on 08/12/2024 with melena secondary to upper GI bleeding, seen by GI and underwent EGD1/10/2024-showed esophageal plaques suspicious for candidiasis, nonbleeding gastric ulcer with pigmented material, and gastritis.  Unfortunately left AMA after the EGD.  It was noted that creatinine worsened from 1.19 to 1.63 on repeat morning labs.  Patient seen again in the ED with worsening AKI, placed on IV fluids and admitted Patient placed on IV Diflucan , IV PPI.  GI was consulted.  Subjective: Seen and examined today Patient complains of ongoing abdominal discomfort and pain, last BM 1/4, does not feel ready for discharge yet denies vomiting having nausea Overnight afebrile, VSS-mild tachycardia and BP soft in 100, Labs-creatinine stable at 1 hemoglobin 10.8 with mild thrombocytopenia   Assessment and plan:   Upper GI bleed due to gastric ulcer/gastritis Esophageal candidiasis Recent admission 1/2 and EGD 1/3-left AMA 1/3: EGD 08/13/2024 that showed esophageal plaques suspicious for candidiasis, large nonbleeding gastric ulcer with pigmented material and gastritis. On Aleve  nightly at home, discontinued.  CT chest abdomen pelvis on admission no acute finding Overall hemoglobin is stable, continue PPI twice daily, Diflucan  renally dosed-complete 2 weeks course. On soft diet continue.GI has been consulted and signed off 1/5-advised outpatient follow-up in 6 to 8 weeks with [facility]. Recent Labs  Lab 08/14/24 0431 08/14/24 1436 08/15/24 0345 08/16/24 0411 08/17/24 0357  HGB 12.6 12.5 13.0 12.1 10.8*  HCT 38.8 38.6 39.4 36.8 33.4*    Acute kidney injury Mild  metabolic acidosis-resolved: Multifactorial, prerenal azotemia, due to NSAID as well.  DC NSAIDs.  AKI resolved with IV fluids. Renal ultrasound with no acute findings, inferior pole right kidney stone measuring 5 mm.  Mild thrombocytopenia: Likely in the setting of acute illness.  Follow-up CBC with PCP   Elevated troponin 121>>> 127 Chest pain on presentation but pointing towards upper abdomen likely from gastritis and esophagitis CT chest abdomen and pelvis done on admission with no acute findings, no evidence of bowel perforation, gastric wall thickening in the distal stomach similar to prior study.  Esophageal dilation suggest chronic esophageal dysmotility such as achalasia, similar to prior studies.   EKG done with diffuse T wave inversions, nonspecific ST depressions unchanged from prior EKG. Likely elevated troponin secondary to demand ischemia in the setting of GI bleed, dehydration and AKI. Underwent TTE showing EF > 75%, NWMA, severe LVH- HCM-seen by cardiology to advise additional workup will need outpatient follow-up ? cardiac MRI   Hypertension: BP soft -metoprolol .  History of substance use disorder/history of alcohol use Tobacco abuse: Reports occasional alcohol use, UDS positive for multiple substances reported Patient was placed on Ativan  withdrawal protocol however patient significantly drowsy after receiving IV Ativan -discontinued.   Continue nicotine patch  Class I Obesity w/ Body mass index is 30.95 kg/m.: Will benefit with PCP follow-up, weight loss,healthy lifestyle  DVT prophylaxis: Place and maintain sequential compression device Start: 08/14/24 0926 SCDs Start: 08/14/24 0128 no chemical prophylaxis due to bleeding and anemia Code Status:   Code Status: Full Code Family Communication: plan of care discussed with patient at bedside. Patient status is: Remains hospitalized because of severity of illness Level of care: Telemetry   Dispo: The patient  is from:  home            Anticipated disposition: Anticipate discharge home tomorrow if stable  Objective: Vitals last 24 hrs: Vitals:   08/16/24 1320 08/16/24 1943 08/17/24 0420 08/17/24 0421  BP:  120/74  (!) 108/53  Pulse:  99  (!) 103  Resp:  17  17  Temp:  98.2 F (36.8 C)  98.2 F (36.8 C)  TempSrc:      SpO2: 95% 100% (!) 88% 91%  Weight:      Height:        Physical Examination: General exam: alert awake, oriented, older than stated age HEENT:Oral mucosa moist, Ear/Nose WNL grossly Respiratory system: Bilaterally clear BS,no use of accessory muscle Cardiovascular system: S1 & S2 +, No JVD. Gastrointestinal system: Abdomen soft, generalized tenderness ,ND, BS+ Nervous System: Alert, awake, moving all extremities,and following commands. Extremities: extremities warm, leg edema neg Skin: Warm, no rashes MSK: Normal muscle bulk,tone, power   Medications reviewed:  Scheduled Meds:  folic acid   1 mg Oral Daily   metoprolol  succinate  50 mg Oral Daily   multivitamin with minerals  1 tablet Oral Daily   pantoprazole  (PROTONIX ) IV  40 mg Intravenous Q12H   senna-docusate  1 tablet Oral QHS   sodium chloride  flush  3 mL Intravenous Q12H   thiamine   100 mg Oral Daily   Or   thiamine   100 mg Intravenous Daily   Continuous Infusions:  fluconazole  (DIFLUCAN ) IV 400 mg (08/17/24 0512)   Diet: Diet Order             DIET SOFT Room service appropriate? Yes; Fluid consistency: Thin  Diet effective 0500 tomorrow                   Data Reviewed: I have personally reviewed following labs and imaging studies ( see epic result tab) CBC: Recent Labs  Lab 08/13/24 2025 08/14/24 0431 08/14/24 1436 08/15/24 0345 08/16/24 0411 08/17/24 0357  WBC 10.8* 8.1  --  8.6 8.1 9.6  NEUTROABS 8.5*  --   --   --   --  7.9*  HGB 14.4 12.6 12.5 13.0 12.1 10.8*  HCT 44.0 38.8 38.6 39.4 36.8 33.4*  MCV 90.3 89.4  --  89.3 90.0 89.8  PLT 209 164  --  149* 131* 141*   CMP: Recent Labs   Lab 08/13/24 2025 08/14/24 0431 08/15/24 0345 08/16/24 0411 08/17/24 0357  NA 143 141 142 142 142  K 4.2 4.4 4.7 5.0 4.6  CL 104 108 107 109 108  CO2 26 22 23  21* 23  GLUCOSE 107* 115* 95 99 118*  BUN 41* 40* 27* 27* 18  CREATININE 2.66* 1.74* 1.09* 1.00 1.00  CALCIUM  10.7* 9.6 9.9 10.0 9.7  MG  --  2.4  --   --  2.3   GFR: Estimated Creatinine Clearance: 61.1 mL/min (by C-G formula based on SCr of 1 mg/dL). Recent Labs  Lab 08/12/24 0814 08/13/24 2025  AST 21 37  ALT 17 20  ALKPHOS 63 60  BILITOT 0.6 0.4  PROT 7.8 8.0  ALBUMIN 4.3 4.3    Recent Labs  Lab 08/12/24 0814 08/13/24 2127  LIPASE 26 36   No results for input(s): AMMONIA in the last 168 hours. Coagulation Profile:  Recent Labs  Lab 08/13/24 2127  INR 1.0   Unresulted Labs (From admission, onward)    None      Antimicrobials/Microbiology: Anti-infectives (From admission, onward)  Start     Dose/Rate Route Frequency Ordered Stop   08/16/24 0600  fluconazole  (DIFLUCAN ) IVPB 400 mg        400 mg 100 mL/hr over 120 Minutes Intravenous Every 24 hours 08/15/24 0824     08/15/24 0500  fluconazole  (DIFLUCAN ) IVPB 200 mg  Status:  Discontinued        200 mg 100 mL/hr over 60 Minutes Intravenous Every 24 hours 08/14/24 0128 08/15/24 0824   08/14/24 0200  fluconazole  (DIFLUCAN ) IVPB 400 mg        400 mg 100 mL/hr over 120 Minutes Intravenous  Once 08/14/24 0144 08/14/24 1328         Component Value Date/Time   SDES  09/08/2023 1424    BLOOD LEFT FOREARM Performed at [facility], [address] [place], [place] [address]    SPECREQUEST  09/08/2023 1424    BOTTLES DRAWN AEROBIC AND ANAEROBIC Blood Culture results may not be optimal due to an inadequate volume of blood received in culture bottles Performed at [facility], [address], [place], [place] [address]    CULT  09/08/2023 1424    NO GROWTH 5 DAYS Performed at [facility], [address],  [place], [place] [address]    REPTSTATUS 09/13/2023 FINAL 09/08/2023 1424    Procedures:  Mennie LAMY, MD [facility] 08/17/2024, 12:51 PM   "

## 2024-08-18 DIAGNOSIS — N179 Acute kidney failure, unspecified: Secondary | ICD-10-CM | POA: Diagnosis not present

## 2024-08-18 MED ORDER — HYDROMORPHONE HCL 1 MG/ML IJ SOLN
0.5000 mg | INTRAMUSCULAR | Status: DC | PRN
  Administered 2024-08-18 – 2024-08-21 (×14): 0.5 mg via INTRAVENOUS
  Filled 2024-08-18 (×14): qty 0.5

## 2024-08-18 MED ORDER — FLUCONAZOLE 200 MG PO TABS
400.0000 mg | ORAL_TABLET | Freq: Every day | ORAL | Status: DC
  Administered 2024-08-19 – 2024-08-22 (×4): 400 mg via ORAL
  Filled 2024-08-18 (×4): qty 2

## 2024-08-18 MED ORDER — DICYCLOMINE HCL 20 MG PO TABS
20.0000 mg | ORAL_TABLET | Freq: Three times a day (TID) | ORAL | Status: DC
  Administered 2024-08-18 – 2024-08-22 (×13): 20 mg via ORAL
  Filled 2024-08-18 (×14): qty 1

## 2024-08-18 MED ORDER — PANTOPRAZOLE SODIUM 40 MG PO TBEC
40.0000 mg | DELAYED_RELEASE_TABLET | Freq: Two times a day (BID) | ORAL | Status: DC
  Administered 2024-08-18 – 2024-08-22 (×8): 40 mg via ORAL
  Filled 2024-08-18 (×8): qty 1

## 2024-08-18 NOTE — TOC Progression Note (Signed)
 Transition of Care [facility]) - Progression Note    Patient Details  Name: Jennifer [name] MRN: [id number] Date of Birth: [birth year]  [facility] CM/SW Contact  Sonda Manuella Quill, RN Phone Number: 08/18/2024, 3:39 PM  Clinical Narrative:    Awaiting PT/OT evals; IP CM following.     Barriers to Discharge: No Barriers Identified               Expected Discharge Plan and Services                                               Social Drivers of Health (SDOH) Interventions SDOH Screenings   Food Insecurity: No Food Insecurity (08/14/2024)  Housing: Low Risk (08/14/2024)  Transportation Needs: Unmet Transportation Needs (08/14/2024)  Utilities: Not At Risk (08/14/2024)  Depression (PHQ2-9): Low Risk (05/24/2024)  Recent Concern: Depression (PHQ2-9) - Medium Risk (03/04/2024)  Tobacco Use: High Risk (08/13/2024)    Readmission Risk Interventions     No data to display

## 2024-08-18 NOTE — Progress Notes (Signed)
 " PROGRESS NOTE Jennifer [name]  FMW:[id number] DOB: [birth year] DOA: 08/13/2024 PCP: Delbert Clam, MD  Brief Narrative/Hospital Course: Jennifer [name] is a 64 y.o. female with PMH of  HTN, BPD, substance use disorder, tobacco use who presented to the ED for evaluation of abdominal pain and black stools. Patient was initially admitted on 08/12/2024 with melena secondary to upper GI bleeding, seen by GI and underwent EGD1/10/2024-showed esophageal plaques suspicious for candidiasis, nonbleeding gastric ulcer with pigmented material, and gastritis.  Unfortunately left AMA after the EGD.  It was noted that creatinine worsened from 1.19 to 1.63 on repeat morning labs.  Patient seen again in the ED with worsening AKI, placed on IV fluids and admitted Patient placed on IV Diflucan , IV PPI.  GI was consulted-per GI continue diet as tolerated complete 2 weeks course of Diflucan , PPI twice daily and 6 to 8 weeks follow-up.  Subjective: Seen and examined  Alert awake, complains of ongoing abdominal pain and difficulty with mobility due to pain Overnight remains afebrile, VSS fairly stable  Assessment and plan:   Upper GI bleed due to gastric ulcer/gastritis Esophageal candidiasis Recent admission 1/2 and EGD 1/3-left AMA 1/3: EGD 08/13/2024 that showed esophageal plaques suspicious for candidiasis, large nonbleeding gastric ulcer with pigmented material and gastritis. On Aleve  nightly at home, discontinued.  CT chest abdomen pelvis on admission no acute finding hemoglobin fairly stable, continue PPI BID, Diflucan  - renally dosed-complete 2 weeks course. Continue diet as tolerated.GI signed off 1/5-advised outpatient follow-up in 6 to 8 weeks with [facility]. Wean IV opiates-add Bentyl  Recent Labs  Lab 08/14/24 0431 08/14/24 1436 08/15/24 0345 08/16/24 0411 08/17/24 0357  HGB 12.6 12.5 13.0 12.1 10.8*  HCT 38.8 38.6 39.4 36.8 33.4*    AKI Mild metabolic acidosis-resolved: Multifactorial,  prerenal azotemia, due to NSAID as well.  Resolved. DC NSAIDs.Renal us -no acute findings, inferior pole right kidney stone measuring 5 mm.  Mild thrombocytopenia: Likely in the setting of acute illness.  Follow-up CBC with PCP   Elevated troponin 121>>> 127: Chest pain on presentation but pointing towards upper abdomen likely from gastritis and esophagitis CT chest abdomen and pelvis done on admission with no acute findings, no evidence of bowel perforation, gastric wall thickening in the distal stomach similar to prior study.  Esophageal dilation suggest chronic esophageal dysmotility such as achalasia, similar to prior studies.   EKG done with diffuse T wave inversions, nonspecific ST depressions unchanged from prior EKG. Likely elevated troponin secondary to demand ischemia in the setting of GI bleed, dehydration and AKI. Underwent TTE showing EF > 75%, NWMA, severe LVH- HCM-seen by cardiology to advise additional workup will need outpatient follow-up ? cardiac MRI.   Hypertension: BP soft -cont metoprolol .  History of substance use disorder/history of alcohol use Tobacco abuse: Reports occasional alcohol use, UDS positive for multiple substances reported Patient was placed on Ativan  withdrawal protocol however patient significantly drowsy after receiving IV Ativan -discontinued.   Continue nicotine patch  Class I Obesity w/ Body mass index is 30.95 kg/m.: Will benefit with PCP follow-up, weight loss,healthy lifestyle  DVT prophylaxis: Place and maintain sequential compression device Start: 08/14/24 0926 SCDs Start: 08/14/24 0128 no chemical prophylaxis due to bleeding and anemia Code Status:   Code Status: Full Code Family Communication: plan of care discussed with patient at bedside. Patient status is: Remains hospitalized because of severity of illness Level of care: Telemetry   Dispo: The patient is from: home  Anticipated disposition: Home in 24 hs. ambulate with PT    Objective: Vitals last 24 hrs: Vitals:   08/17/24 2046 08/17/24 2111 08/17/24 2354 08/18/24 0625  BP: (!) 95/59 103/68 115/73 130/82  Pulse: 99 71  89  Resp: 19   17  Temp: 98.8 F (37.1 C)   98.2 F (36.8 C)  TempSrc:    Oral  SpO2: (!) 87% 93%  92%  Weight:      Height:       Physical Examination: General exam: alert awake, oriented, older than stated age HEENT:Oral mucosa moist, Ear/Nose WNL grossly Respiratory system: Bilaterally clear BS,no use of accessory muscle Cardiovascular system: S1 & S2 +, No JVD. Gastrointestinal system: Abdomen soft, generalized tenderness ,ND, BS+ Nervous System: Alert, awake, moving all extremities,and following commands. Extremities: extremities warm, leg edema neg Skin: Warm, no rashes MSK: Normal muscle bulk,tone, power   Medications reviewed:  Scheduled Meds:  dicyclomine   20 mg Oral TID AC   folic acid   1 mg Oral Daily   metoprolol  succinate  50 mg Oral Daily   multivitamin with minerals  1 tablet Oral Daily   pantoprazole  (PROTONIX ) IV  40 mg Intravenous Q12H   senna-docusate  1 tablet Oral QHS   sodium chloride  flush  3 mL Intravenous Q12H   thiamine   100 mg Oral Daily   Or   thiamine   100 mg Intravenous Daily   Continuous Infusions:  fluconazole  (DIFLUCAN ) IV 400 mg (08/18/24 0444)   Diet: Diet Order             DIET SOFT Room service appropriate? Yes; Fluid consistency: Thin  Diet effective now                   Data Reviewed: I have personally reviewed following labs and imaging studies ( see epic result tab) CBC: Recent Labs  Lab 08/13/24 2025 08/14/24 0431 08/14/24 1436 08/15/24 0345 08/16/24 0411 08/17/24 0357  WBC 10.8* 8.1  --  8.6 8.1 9.6  NEUTROABS 8.5*  --   --   --   --  7.9*  HGB 14.4 12.6 12.5 13.0 12.1 10.8*  HCT 44.0 38.8 38.6 39.4 36.8 33.4*  MCV 90.3 89.4  --  89.3 90.0 89.8  PLT 209 164  --  149* 131* 141*   CMP: Recent Labs  Lab 08/13/24 2025 08/14/24 0431 08/15/24 0345  08/16/24 0411 08/17/24 0357  NA 143 141 142 142 142  K 4.2 4.4 4.7 5.0 4.6  CL 104 108 107 109 108  CO2 26 22 23  21* 23  GLUCOSE 107* 115* 95 99 118*  BUN 41* 40* 27* 27* 18  CREATININE 2.66* 1.74* 1.09* 1.00 1.00  CALCIUM  10.7* 9.6 9.9 10.0 9.7  MG  --  2.4  --   --  2.3   GFR: Estimated Creatinine Clearance: 61.1 mL/min (by C-G formula based on SCr of 1 mg/dL). Recent Labs  Lab 08/12/24 0814 08/13/24 2025  AST 21 37  ALT 17 20  ALKPHOS 63 60  BILITOT 0.6 0.4  PROT 7.8 8.0  ALBUMIN 4.3 4.3    Recent Labs  Lab 08/12/24 0814 08/13/24 2127  LIPASE 26 36   No results for input(s): AMMONIA in the last 168 hours. Coagulation Profile:  Recent Labs  Lab 08/13/24 2127  INR 1.0   Unresulted Labs (From admission, onward)    None      Antimicrobials/Microbiology: Anti-infectives (From admission, onward)    Start  Dose/Rate Route Frequency Ordered Stop   08/16/24 0600  fluconazole  (DIFLUCAN ) IVPB 400 mg        400 mg 100 mL/hr over 120 Minutes Intravenous Every 24 hours 08/15/24 0824     08/15/24 0500  fluconazole  (DIFLUCAN ) IVPB 200 mg  Status:  Discontinued        200 mg 100 mL/hr over 60 Minutes Intravenous Every 24 hours 08/14/24 0128 08/15/24 0824   08/14/24 0200  fluconazole  (DIFLUCAN ) IVPB 400 mg        400 mg 100 mL/hr over 120 Minutes Intravenous  Once 08/14/24 0144 08/14/24 1328         Component Value Date/Time   SDES  09/08/2023 1424    BLOOD LEFT FOREARM Performed at [facility], [address], [place], [place] [address]    SPECREQUEST  09/08/2023 1424    BOTTLES DRAWN AEROBIC AND ANAEROBIC Blood Culture results may not be optimal due to an inadequate volume of blood received in culture bottles Performed at [facility], [address] [place], [place] [address]    CULT  09/08/2023 1424    NO GROWTH 5 DAYS Performed at [facility], [address], [place], [place] [address]    REPTSTATUS 09/13/2023 FINAL  09/08/2023 1424    Procedures:  Mennie LAMY, MD [facility] 08/18/2024, 9:54 AM   "

## 2024-08-19 DIAGNOSIS — N179 Acute kidney failure, unspecified: Secondary | ICD-10-CM | POA: Diagnosis not present

## 2024-08-19 NOTE — Evaluation (Signed)
 Occupational Therapy Evaluation Patient Details Name: Jennifer [name] MRN: [id number] DOB: 1960-09-02 Today's Date: 08/19/2024   History of Present Illness   Pt is 64 yr old female admitted on 08/13/24 with abdominal pain and black stools.  Pt with upper GI bleed due to gastric ulcers, esophageal candidiasis, AKI, elevated troponin, and UDS positive for multiple substances.  PMH: HTN, BPD, substance use disorder, bipolar disorder      Clinical Impressions The pt is currently presenting below her baseline level of functioning for self-care management. 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 During the session today, she reported having severe abdominal pain, she rated as 99 out of 10. Due to pain, her movements were guarded and her activity tolerance was compromised. She required CGA to min assist for most tasks, including sit to stand, lower body dressing, and short distance ambulation using a RW. She required frequent redirection to tasks, due to being hyper verbose and tangential. She reported she has no support from family or friends, and she does not feel as though she would be able to care for herself should she return home at discharge (she reported living in a shed). OT will follow her for further OT services in the acute care setting. Short-term SNF rehab is recommended. If her pain and subsequent activity tolerance improves, she may progress and not need SNF rehab.      If plan is discharge home, recommend the following:   A little help with walking and/or transfers;A little help with bathing/dressing/bathroom;Assistance with cooking/housework;Direct supervision/assist for medications management;Assist for transportation     Functional Status Assessment   Patient has had a recent decline in their functional status and demonstrates the ability to make significant improvements in function in a reasonable and predictable amount of time.     Equipment  Recommendations   Other (comment);Tub/shower seat (Rolling walker)     Recommendations for Other Services         Precautions/Restrictions   Precautions Precautions: Fall Restrictions Weight Bearing Restrictions Per Provider Order: No     Mobility Bed Mobility Overal bed mobility: Needs Assistance Bed Mobility: Supine to Sit     Supine to sit: Contact guard, HOB elevated, Used rails          Transfers Overall transfer level: Needs assistance Equipment used: Rolling walker (2 wheels) Transfers: Sit to/from Stand Sit to Stand: Contact guard assist, From elevated surface                  Balance     Sitting balance-Leahy Scale: Good         Standing balance comment: CGA with RW        ADL either performed or assessed with clinical judgement   ADL Overall ADL's : Needs assistance/impaired Eating/Feeding: Independent;Sitting   Grooming: Set up;Supervision/safety;Sitting Grooming Details (indicate cue type and reason): simulated seated EOB Upper Body Bathing: Set up;Supervision/ safety;Sitting Upper Body Bathing Details (indicate cue type and reason): simulated seated EOB Lower Body Bathing: Contact guard assist;Sitting/lateral leans;Sit to/from stand;Cueing for safety   Upper Body Dressing : Set up;Supervision/safety;Sitting   Lower Body Dressing: Contact guard assist;Sitting/lateral leans;Sit to/from stand;Cueing for safety   Toilet Transfer: Contact guard assist;Rolling walker (2 wheels);Ambulation;Grab bars   Toileting- Clothing Manipulation and Hygiene: Minimal assistance;Sit to/from stand;Cueing for safety Toileting - Clothing Manipulation Details (indicate cue type and reason): at bathroom level, based on clinical judgement  Pertinent Vitals/Pain Pain Assessment Pain Assessment: 0-10 Pain Score:  (99) Pain Location: abdomen, which she attributes to ulcers Pain Intervention(s): Limited activity within patient's  tolerance, Monitored during session, Repositioned     Extremity/Trunk Assessment Upper Extremity Assessment Upper Extremity Assessment: Right hand dominant;LUE deficits/detail;RUE deficits/detail RUE Deficits / Details: AROM WFL. Grip strenth 4+/5 LUE Deficits / Details: AROM WFL. Grip strength 4+/5   Lower Extremity Assessment Lower Extremity Assessment: RLE deficits/detail;LLE deficits/detail RLE Deficits / Details: AROM WFL LLE Deficits / Details: AROM WFL   Cervical / Trunk Assessment Cervical / Trunk Assessment: Normal   Communication Communication Communication: No apparent difficulties   Cognition Arousal: Alert Behavior During Therapy: Anxious, Lability, Impulsive       Awareness: Online awareness impaired   Attention impairment (select first level of impairment): Sustained attention Executive functioning impairment (select all impairments): Organization OT - Cognition Comments: She was hyperverbose and tangential, therefore needing frequent redirection to tasks and topics. Her thoughts could be varying and loosely associated.          Following commands: Intact Following commands impaired: Only follows one step commands consistently     Cueing  General Comments   Cueing Techniques: Verbal cues              Home Living Family/patient expects to be discharged to:: Private residence Living Arrangements: Alone Available Help at Discharge:  (she reported having no support from family and friends) Type of Home: Other(Comment) (She reported living in a shed.) Home Access: Level entry     Home Layout: One level               Home Equipment: None          Prior Functioning/Environment Prior Level of Function : Independent/Modified Independent             Mobility Comments:  (She was independent with ambulation.) ADLs Comments: She 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    OT Problem List: Decreased  strength;Decreased activity tolerance;Impaired balance (sitting and/or standing);Decreased cognition;Decreased safety awareness;Decreased knowledge of use of DME or AE;Pain   OT Treatment/Interventions: Self-care/ADL training;Therapeutic exercise;Energy conservation;DME and/or AE instruction;Therapeutic activities;Balance training;Patient/family education      OT Goals(Current goals can be found in the care plan section)   Acute Rehab OT Goals Patient Stated Goal: decreased pain OT Goal Formulation: With patient Time For Goal Achievement: 09/02/24 Potential to Achieve Goals: Good ADL Goals Pt Will Perform Grooming: with modified independence;standing Pt Will Perform Lower Body Dressing: with modified independence;sit to/from stand;sitting/lateral leans Pt Will Transfer to Toilet: with modified independence;ambulating;grab bars Pt Will Perform Toileting - Clothing Manipulation and hygiene: with modified independence;sit to/from stand   OT Frequency:  Min 2X/week       AM-PAC OT 6 Clicks Daily Activity     Outcome Measure Help from another person eating meals?: None Help from another person taking care of personal grooming?: A Little Help from another person toileting, which includes using toliet, bedpan, or urinal?: A Little Help from another person bathing (including washing, rinsing, drying)?: A Little Help from another person to put on and taking off regular upper body clothing?: A Little Help from another person to put on and taking off regular lower body clothing?: A Little 6 Click Score: 19   End of Session Equipment Utilized During Treatment: Gait belt;Rolling walker (2 wheels) Nurse Communication: Mobility status  Activity Tolerance: Patient limited by pain Patient left: in bed;with call bell/phone within reach;with bed  alarm set  OT Visit Diagnosis: Unsteadiness on feet (R26.81);Other abnormalities of gait and mobility (R26.89);Muscle weakness (generalized)  (M62.81);Other symptoms and signs involving cognitive function;Pain Pain - part of body:  (abdomen)                Time: 1000-1022 OT Time Calculation (min): 22 min Charges:  OT General Charges $OT Visit: 1 Visit OT Evaluation $OT Eval Moderate Complexity: 1 Mod    Henderson Frampton J Harris, OTR/L 08/19/2024, 3:22 PM

## 2024-08-19 NOTE — Evaluation (Signed)
 Physical Therapy Evaluation Patient Details Name: Jennifer [name] MRN: [id number] DOB: 10-22-1960 Today's Date: 08/19/2024  History of Present Illness  Pt is 64 yo female admitted on 08/13/24 with abdominal pain and black stools.  Pt with upper GI bleed due to gastric ulcers, esophageal candidiasis, AKI, elevated troponin, and UDS positive for multiple substances.  Pt with hx including but not limited HTN, BPD, substance use disorder  Clinical Impression  Pt admitted with above diagnosis. At baseline, pt active, independent, working, and no issues with mobility.  Reports she lives in a shed that has been converted to living environment.  Reports she has no one that can assist.  Today, pt needing min A for transfers and ambulation.  Main barrier to movement is her abdominal pain which she reports is coming from ulcers.   Pt currently with functional limitations due to the deficits listed below (see PT Problem List). Pt will benefit from acute skilled PT to increase their independence and safety with mobility to allow discharge.  Due to current mobility limitations and no support recommend Patient will benefit from continued inpatient follow up therapy, <3 hours/day ;however, if pain improves pt could likely make quick progress.          If plan is discharge home, recommend the following: A little help with walking and/or transfers;A little help with bathing/dressing/bathroom;Assistance with cooking/housework;Help with stairs or ramp for entrance   Can travel by private vehicle   Yes    Equipment Recommendations Rolling walker (2 wheels)  Recommendations for Other Services       Functional Status Assessment Patient has had a recent decline in their functional status and demonstrates the ability to make significant improvements in function in a reasonable and predictable amount of time.     Precautions / Restrictions Precautions Precautions: Fall      Mobility  Bed Mobility Overal bed  mobility: Needs Assistance Bed Mobility: Supine to Sit, Sit to Supine     Supine to sit: Min assist Sit to supine: Min assist        Transfers Overall transfer level: Needs assistance Equipment used: Rolling walker (2 wheels) Transfers: Sit to/from Stand Sit to Stand: Min assist                Ambulation/Gait Ambulation/Gait assistance: Min Chemical Engineer (Feet): 50 Feet Assistive device: Rolling walker (2 wheels) Gait Pattern/deviations: Step-to pattern, Decreased stride length, Trunk flexed       General Gait Details: Pt reaching for assist with transfers, slow transitions due to pain.  Ambulated 50' but slow pace, multiple rest breaks, and leaning over RW in pain  Stairs            Wheelchair Mobility     Tilt Bed    Modified Rankin (Stroke Patients Only)       Balance Overall balance assessment: Needs assistance Sitting-balance support: No upper extremity supported Sitting balance-Leahy Scale: Good     Standing balance support: Bilateral upper extremity supported Standing balance-Leahy Scale: Poor Standing balance comment: Reaching for UE support in standing                             Pertinent Vitals/Pain Pain Assessment Pain Assessment: 0-10 Pain Score: 10-Worst pain ever Pain Location: everything I got but reports worst is starts in abdomen and stretches to pack Pain Descriptors / Indicators: Discomfort, Burning Pain Intervention(s): Limited activity within patient's tolerance, Monitored during session, Premedicated  before session, Repositioned    Home Living Family/patient expects to be discharged to:: Private residence Living Arrangements: Alone Available Help at Discharge:  (reports no help) Type of Home: Other(Comment) (Reports a shed that's turned into home - has bathroom, kitchen, living area) Home Access: Level entry       Home Layout: One level Home Equipment: None      Prior Function Prior Level of  Function : Independent/Modified Independent             Mobility Comments: Reports no issues with mobility until this event       Extremity/Trunk Assessment   Upper Extremity Assessment Upper Extremity Assessment: Defer to OT evaluation    Lower Extremity Assessment Lower Extremity Assessment: Generalized weakness (ROM WFL; did not MMT due to c/o pain)    Cervical / Trunk Assessment Cervical / Trunk Assessment: Normal  Communication        Cognition Arousal: Alert Behavior During Therapy: Anxious, Lability   PT - Cognitive impairments: Problem solving, Safety/Judgement                       PT - Cognition Comments: Pt reports increased abdominal pain at all times; tangential; crying about pain at times         Cueing       General Comments      Exercises     Assessment/Plan    PT Assessment Patient needs continued PT services  PT Problem List Decreased strength;Decreased range of motion;Decreased activity tolerance;Decreased balance;Decreased mobility;Pain;Decreased knowledge of use of DME;Decreased safety awareness;Decreased knowledge of precautions       PT Treatment Interventions DME instruction;Therapeutic exercise;Gait training;Stair training;Functional mobility training;Therapeutic activities;Patient/family education;Balance training;Modalities    PT Goals (Current goals can be found in the Care Plan section)  Acute Rehab PT Goals Patient Stated Goal: I need to go to PT/rehab for a few weeks.  and decrease pain PT Goal Formulation: With patient Time For Goal Achievement: 09/02/24 Potential to Achieve Goals: Fair    Frequency Min 2X/week     Co-evaluation               AM-PAC PT 6 Clicks Mobility  Outcome Measure Help needed turning from your back to your side while in a flat bed without using bedrails?: A Little Help needed moving from lying on your back to sitting on the side of a flat bed without using bedrails?: A  Little Help needed moving to and from a bed to a chair (including a wheelchair)?: A Little Help needed standing up from a chair using your arms (e.g., wheelchair or bedside chair)?: A Little Help needed to walk in hospital room?: A Little Help needed climbing 3-5 steps with a railing? : A Lot 6 Click Score: 17    End of Session Equipment Utilized During Treatment: Gait belt Activity Tolerance: Patient limited by pain Patient left: in bed;with call bell/phone within reach;with bed alarm set (4th floor - automatic alarm) Nurse Communication: Mobility status PT Visit Diagnosis: Other abnormalities of gait and mobility (R26.89);Muscle weakness (generalized) (M62.81)    Time: 8841-8780 PT Time Calculation (min) (ACUTE ONLY): 21 min   Charges:   PT Evaluation $PT Eval Low Complexity: 1 Low   PT General Charges $$ ACUTE PT VISIT: 1 Visit         Jennifer, PT Acute Rehab [facility] [phone]   Jennifer [name] 08/19/2024, 1:47 PM

## 2024-08-19 NOTE — Progress Notes (Signed)
 " PROGRESS NOTE Jennifer [name]  FMW:[id number] DOB: March 14, 1961 DOA: 08/13/2024 PCP: Delbert Clam, MD  Brief Narrative/Hospital Course: Jennifer [name] is a 64 y.o. female with PMH of  HTN, BPD, substance use disorder, tobacco use who presented to the ED for evaluation of abdominal pain and black stools. Patient was initially admitted on 08/12/2024 with melena secondary to upper GI bleeding, seen by GI and underwent EGD1/10/2024-showed esophageal plaques suspicious for candidiasis, nonbleeding gastric ulcer with pigmented material, and gastritis.  Unfortunately left AMA after the EGD.  It was noted that creatinine worsened from 1.19 to 1.63 on repeat morning labs.  Patient seen again in the ED with worsening AKI, placed on IV fluids and admitted Patient placed on IV Diflucan , IV PPI.  GI was consulted-per GI continue diet as tolerated complete 2 weeks course of Diflucan , PPI twice daily and 6 to 8 weeks follow-up.  Subjective: Seen and examined  Patient reports he complains of being weak, does not feel ready for home today asking for 1 more day Overnight afebrile vital stable No new complaints  Assessment and plan:   Upper GI bleed due to gastric ulcer/gastritis Esophageal candidiasis Recent admission 1/2 and EGD 1/3-left AMA 1/3: EGD 08/13/2024 that showed esophageal plaques suspicious for candidiasis, large nonbleeding gastric ulcer with pigmented material and gastritis.On Aleve  nightly at home-now discontinued.  CT chest abdomen pelvis on admission-no acute finding hemoglobin fairly stable Diet has been advanced,complete 2 weeks of Diflucan , continue PPI twice daily and follow-up with GI  in 6 to 8 weeks with [facility]. Recent Labs  Lab 08/14/24 0431 08/14/24 1436 08/15/24 0345 08/16/24 0411 08/17/24 0357  HGB 12.6 12.5 13.0 12.1 10.8*  HCT 38.8 38.6 39.4 36.8 33.4*    AKI Mild metabolic acidosis-resolved: Multifactorial, prerenal azotemia, due to NSAID as well.  Resolved. DC  NSAIDs.Renal us -no acute findings, inferior pole right kidney stone measuring 5 mm.  Mild thrombocytopenia: Likely in the setting of acute illness.  Follow-up CBC with PCP   Elevated troponin 121>>> 127: Chest pain on presentation but pointing towards upper abdomen likely from gastritis and esophagitis CT chest abdomen and pelvis done on admission with no acute findings, no evidence of bowel perforation, gastric wall thickening in the distal stomach similar to prior study.  Esophageal dilation suggest chronic esophageal dysmotility such as achalasia, similar to prior studies.   EKG done with diffuse T wave inversions, nonspecific ST depressions unchanged from prior EKG. Likely elevated troponin secondary to demand ischemia in the setting of GI bleed, dehydration and AKI. Underwent TTE showing EF > 75%, NWMA, severe LVH- HCM-seen by cardiology to advise additional workup will need outpatient follow-up ? cardiac MRI.   Hypertension: BP soft -cont metoprolol .  History of substance use disorder/history of alcohol use Tobacco abuse: Reports occasional alcohol use, UDS positive for multiple substances reported Patient was placed on Ativan  withdrawal protocol however patient significantly drowsy after receiving IV Ativan -discontinued.   Continue nicotine patch  Deconditioning/debility Weakness: Continue PT eval, encouraged mobilization Mobility: PT Orders: Active PT Follow up Rec:     Class I Obesity w/ Body mass index is 30.95 kg/m.: Will benefit with PCP follow-up, weight loss,healthy lifestyle  DVT prophylaxis: Place and maintain sequential compression device Start: 08/14/24 0926 SCDs Start: 08/14/24 0128 no chemical prophylaxis due to bleeding and anemia Code Status:   Code Status: Full Code Family Communication: plan of care discussed with patient at bedside. Patient status is: Remains hospitalized because of severity of illness Level of care:  Telemetry   Dispo: The patient is  from: home            Anticipated disposition: Anticipating  discharge tomorrow if remains stable, PT OT eval pending   Objective: Vitals last 24 hrs: Vitals:   08/18/24 0625 08/18/24 1342 08/18/24 2044 08/19/24 0416  BP: 130/82 133/72 126/88 138/84  Pulse: 89 78 88 90  Resp: 17 20 12  (!) 24  Temp: 98.2 F (36.8 C) 99.5 F (37.5 C) 98.3 F (36.8 C) 99.4 F (37.4 C)  TempSrc: Oral Oral Oral Oral  SpO2: 92% 98% 100% 93%  Weight:      Height:       Physical Examination: General exam: AAOX3 HEENT:Oral mucosa moist, Ear/Nose WNL grossly Respiratory system: Bilaterally clear BS,no use of accessory muscle Cardiovascular system: S1 & S2 +, No JVD. Gastrointestinal system: Abdomen soft, I will generalized tenderness ,ND, BS+ Nervous System: Alert, awake, moving all extremities,and following commands. Extremities: extremities warm, leg edema neg Skin: Warm, no rashes MSK: Normal muscle bulk,tone, power   Medications reviewed:  Scheduled Meds:  dicyclomine   20 mg Oral TID AC   fluconazole   400 mg Oral Daily   folic acid   1 mg Oral Daily   metoprolol  succinate  50 mg Oral Daily   multivitamin with minerals  1 tablet Oral Daily   pantoprazole   40 mg Oral BID   senna-docusate  1 tablet Oral QHS   sodium chloride  flush  3 mL Intravenous Q12H   thiamine   100 mg Oral Daily   Or   thiamine   100 mg Intravenous Daily   Continuous Infusions:   Diet: Diet Order             DIET SOFT Room service appropriate? Yes; Fluid consistency: Thin  Diet effective now                   Data Reviewed: I have personally reviewed following labs and imaging studies ( see epic result tab) CBC: Recent Labs  Lab 08/13/24 2025 08/14/24 0431 08/14/24 1436 08/15/24 0345 08/16/24 0411 08/17/24 0357  WBC 10.8* 8.1  --  8.6 8.1 9.6  NEUTROABS 8.5*  --   --   --   --  7.9*  HGB 14.4 12.6 12.5 13.0 12.1 10.8*  HCT 44.0 38.8 38.6 39.4 36.8 33.4*  MCV 90.3 89.4  --  89.3 90.0 89.8  PLT 209  164  --  149* 131* 141*   CMP: Recent Labs  Lab 08/13/24 2025 08/14/24 0431 08/15/24 0345 08/16/24 0411 08/17/24 0357  NA 143 141 142 142 142  K 4.2 4.4 4.7 5.0 4.6  CL 104 108 107 109 108  CO2 26 22 23  21* 23  GLUCOSE 107* 115* 95 99 118*  BUN 41* 40* 27* 27* 18  CREATININE 2.66* 1.74* 1.09* 1.00 1.00  CALCIUM  10.7* 9.6 9.9 10.0 9.7  MG  --  2.4  --   --  2.3   GFR: Estimated Creatinine Clearance: 61.1 mL/min (by C-G formula based on SCr of 1 mg/dL). Recent Labs  Lab 08/13/24 2025  AST 37  ALT 20  ALKPHOS 60  BILITOT 0.4  PROT 8.0  ALBUMIN 4.3    Recent Labs  Lab 08/13/24 2127  LIPASE 36   No results for input(s): AMMONIA in the last 168 hours. Coagulation Profile:  Recent Labs  Lab 08/13/24 2127  INR 1.0   Unresulted Labs (From admission, onward)    None  Antimicrobials/Microbiology: Anti-infectives (From admission, onward)    Start     Dose/Rate Route Frequency Ordered Stop   08/19/24 1000  fluconazole  (DIFLUCAN ) tablet 400 mg        400 mg Oral Daily 08/18/24 1155 08/28/24 0959   08/16/24 0600  fluconazole  (DIFLUCAN ) IVPB 400 mg  Status:  Discontinued        400 mg 100 mL/hr over 120 Minutes Intravenous Every 24 hours 08/15/24 0824 08/18/24 1155   08/15/24 0500  fluconazole  (DIFLUCAN ) IVPB 200 mg  Status:  Discontinued        200 mg 100 mL/hr over 60 Minutes Intravenous Every 24 hours 08/14/24 0128 08/15/24 0824   08/14/24 0200  fluconazole  (DIFLUCAN ) IVPB 400 mg        400 mg 100 mL/hr over 120 Minutes Intravenous  Once 08/14/24 0144 08/14/24 1328         Component Value Date/Time   SDES  09/08/2023 1424    BLOOD LEFT FOREARM Performed at [facility], [address], [place], [place] [address]    SPECREQUEST  09/08/2023 1424    BOTTLES DRAWN AEROBIC AND ANAEROBIC Blood Culture results may not be optimal due to an inadequate volume of blood received in culture bottles Performed at [facility], [address] [place], [place] [address]    CULT  09/08/2023 1424    NO GROWTH 5 DAYS Performed at [facility], [address] [place], [place] [address]    REPTSTATUS 09/13/2023 FINAL 09/08/2023 1424    Procedures:  Mennie LAMY, MD [facility] 08/19/2024, 11:12 AM   "

## 2024-08-19 NOTE — Plan of Care (Signed)

## 2024-08-20 DIAGNOSIS — N179 Acute kidney failure, unspecified: Secondary | ICD-10-CM | POA: Diagnosis not present

## 2024-08-20 NOTE — Plan of Care (Signed)

## 2024-08-20 NOTE — Progress Notes (Signed)
 " PROGRESS NOTE Jennifer [name]  FMW:[id number] DOB: 12/21/1960 DOA: 08/13/2024 PCP: Delbert Clam, MD  Brief Narrative/Hospital Course: Jennifer [name] is a 64 y.o. female with PMH of  HTN, BPD, substance use disorder, tobacco use who presented to the ED for evaluation of abdominal pain and black stools. Patient was initially admitted on 08/12/2024 with melena secondary to upper GI bleeding, seen by GI and underwent EGD1/10/2024-showed esophageal plaques suspicious for candidiasis, nonbleeding gastric ulcer with pigmented material, and gastritis.  Unfortunately left AMA after the EGD.  It was noted that creatinine worsened from 1.19 to 1.63 on repeat morning labs.  Patient seen again in the ED with worsening AKI, placed on IV fluids and admitted Patient placed on IV Diflucan , IV PPI.  GI was consulted-per GI continue diet as tolerated complete 2 weeks course of Diflucan , PPI twice daily and 6 to 8 weeks follow-up.  At this time patient is stable  Subjective: Seen and examined  No new complaints, pain fairly controlled complains of generalized weakness Worked with PT OT yesterday> recommending SNF Overnight afebrile vital stable on room air,  Assessment and plan   Upper GI bleed due to gastric ulcer/gastritis Esophageal candidiasis Recent admission 1/2 and EGD 1/3-left AMA 1/3: EGD 08/13/2024 that showed esophageal plaques suspicious for candidiasis, large nonbleeding gastric ulcer with pigmented material and gastritis. PTA on Aleve  nightly- discontinued. CT chest abdomen pelvis on admission-no acute finding hemoglobin fairly stable, tolerating diet.  Per GI complete 2 weeks course of Diflucan , continue PPI, supportive care Follow-up with GI  in 6 to 8 weeks with [facility]. Recent Labs  Lab 08/14/24 0431 08/14/24 1436 08/15/24 0345 08/16/24 0411 08/17/24 0357  HGB 12.6 12.5 13.0 12.1 10.8*  HCT 38.8 38.6 39.4 36.8 33.4*    AKI Mild metabolic acidosis-resolved: Multifactorial, prerenal  azotemia +/-NSAIDl. Aki resolved. Renal us -no acute findings, inferior pole right kidney stone measuring 5 mm.  Mild thrombocytopenia: Likely in the setting of acute illness.  Stable   Elevated troponin 121>>> 127: Chest pain on presentation but pointing towards upper abdomen likely from gastritis and esophagitis CT chest abdomen and pelvis done on admission with no acute findings, no evidence of bowel perforation, gastric wall thickening in the distal stomach similar to prior study.  Esophageal dilation suggest chronic esophageal dysmotility such as achalasia, similar to prior studies.   EKG done with diffuse T wave inversions, nonspecific ST depressions unchanged from prior EKG. Likely elevated troponin secondary to demand ischemia in the setting of GI bleed, dehydration and AKI. Underwent TTE showing EF > 75%, NWMA, severe LVH- HCM-seen by cardiology to advise additional workup will need outpatient follow-up ? cardiac MRI.   Hypertension: BP stable on metoprolol .  History of substance use disorder/history of alcohol use Tobacco abuse: Reports occasional alcohol use, UDS positive for multiple substances reported Patient was placed on Ativan  withdrawal protocol however patient significantly drowsy after receiving IV Ativan -discontinued.   Continue nicotine patch  Deconditioning/debility Weakness: Continue PT eval, encouraged mobilization Mobility: PT Orders: Active PT Follow up Rec: Skilled Nursing-Short Term Rehab (<3 Hours/Day)08/19/2024 1346    Class I Obesity w/ Body mass index is 30.95 kg/m.: Will benefit with PCP follow-up, weight loss,healthy lifestyle  DVT prophylaxis: Place and maintain sequential compression device Start: 08/14/24 0926 SCDs Start: 08/14/24 0128 no chemical prophylaxis due to bleeding and anemia Code Status:   Code Status: Full Code Family Communication: plan of care discussed with patient at bedside. Patient status is: Remains hospitalized because of  severity  of illness Level of care: Telemetry   Dispo: The patient is from: home            Anticipated disposition: PT OT recommending skilled nursing facility.  She is medically stable discharge   Objective: Vitals last 24 hrs: Vitals:   08/19/24 0416 08/19/24 1352 08/19/24 2056 08/20/24 0448  BP: 138/84 134/89 113/64 119/69  Pulse: 90 95 91 82  Resp: (!) 24 20  20   Temp: 99.4 F (37.4 C) 100 F (37.8 C) 100.2 F (37.9 C) 99.3 F (37.4 C)  TempSrc: Oral Oral Oral Oral  SpO2: 93% 96% 93% 94%  Weight:      Height:       Physical Examination: General exam: AAOX3 HEENT:Oral mucosa moist, Ear/Nose WNL grossly Respiratory system: Bilaterally clear BS,no use of accessory muscle Cardiovascular system: S1 & S2 +, No JVD. Gastrointestinal system: Abdomen soft, I will generalized tenderness ,ND, BS+ Nervous System: Alert, awake, moving all extremities,and following commands. Extremities: extremities warm, leg edema neg Skin: Warm, no rashes MSK: Normal muscle bulk,tone, power   Medications reviewed:  Scheduled Meds:  dicyclomine   20 mg Oral TID AC   fluconazole   400 mg Oral Daily   folic acid   1 mg Oral Daily   metoprolol  succinate  50 mg Oral Daily   multivitamin with minerals  1 tablet Oral Daily   pantoprazole   40 mg Oral BID   senna-docusate  1 tablet Oral QHS   sodium chloride  flush  3 mL Intravenous Q12H   thiamine   100 mg Oral Daily   Or   thiamine   100 mg Intravenous Daily   Continuous Infusions:   Diet: Diet Order             DIET SOFT Room service appropriate? Yes; Fluid consistency: Thin  Diet effective now                   Data Reviewed: I have personally reviewed following labs and imaging studies ( see epic result tab) CBC: Recent Labs  Lab 08/13/24 2025 08/14/24 0431 08/14/24 1436 08/15/24 0345 08/16/24 0411 08/17/24 0357  WBC 10.8* 8.1  --  8.6 8.1 9.6  NEUTROABS 8.5*  --   --   --   --  7.9*  HGB 14.4 12.6 12.5 13.0 12.1 10.8*  HCT  44.0 38.8 38.6 39.4 36.8 33.4*  MCV 90.3 89.4  --  89.3 90.0 89.8  PLT 209 164  --  149* 131* 141*   CMP: Recent Labs  Lab 08/13/24 2025 08/14/24 0431 08/15/24 0345 08/16/24 0411 08/17/24 0357  NA 143 141 142 142 142  K 4.2 4.4 4.7 5.0 4.6  CL 104 108 107 109 108  CO2 26 22 23  21* 23  GLUCOSE 107* 115* 95 99 118*  BUN 41* 40* 27* 27* 18  CREATININE 2.66* 1.74* 1.09* 1.00 1.00  CALCIUM  10.7* 9.6 9.9 10.0 9.7  MG  --  2.4  --   --  2.3   GFR: Estimated Creatinine Clearance: 61.1 mL/min (by C-G formula based on SCr of 1 mg/dL). Recent Labs  Lab 08/13/24 2025  AST 37  ALT 20  ALKPHOS 60  BILITOT 0.4  PROT 8.0  ALBUMIN 4.3    Recent Labs  Lab 08/13/24 2127  LIPASE 36   No results for input(s): AMMONIA in the last 168 hours. Coagulation Profile:  Recent Labs  Lab 08/13/24 2127  INR 1.0   Unresulted Labs (From admission, onward)    None  Antimicrobials/Microbiology: Anti-infectives (From admission, onward)    Start     Dose/Rate Route Frequency Ordered Stop   08/19/24 1000  fluconazole  (DIFLUCAN ) tablet 400 mg        400 mg Oral Daily 08/18/24 1155 08/28/24 0959   08/16/24 0600  fluconazole  (DIFLUCAN ) IVPB 400 mg  Status:  Discontinued        400 mg 100 mL/hr over 120 Minutes Intravenous Every 24 hours 08/15/24 0824 08/18/24 1155   08/15/24 0500  fluconazole  (DIFLUCAN ) IVPB 200 mg  Status:  Discontinued        200 mg 100 mL/hr over 60 Minutes Intravenous Every 24 hours 08/14/24 0128 08/15/24 0824   08/14/24 0200  fluconazole  (DIFLUCAN ) IVPB 400 mg        400 mg 100 mL/hr over 120 Minutes Intravenous  Once 08/14/24 0144 08/14/24 1328         Component Value Date/Time   SDES  09/08/2023 1424    BLOOD LEFT FOREARM Performed at [facility], [address] [place], [place] [address]    SPECREQUEST  09/08/2023 1424    BOTTLES DRAWN AEROBIC AND ANAEROBIC Blood Culture results may not be optimal due to an inadequate volume of blood received in  culture bottles Performed at [facility], [address], [place], [place] [address]    CULT  09/08/2023 1424    NO GROWTH 5 DAYS Performed at [facility], [address], [place], [place] [address]    REPTSTATUS 09/13/2023 FINAL 09/08/2023 1424    Procedures:  Mennie LAMY, MD [facility] 08/20/2024, 11:08 AM   "

## 2024-08-21 DIAGNOSIS — N179 Acute kidney failure, unspecified: Secondary | ICD-10-CM | POA: Diagnosis not present

## 2024-08-21 MED ORDER — OXYCODONE-ACETAMINOPHEN 5-325 MG PO TABS
1.0000 | ORAL_TABLET | Freq: Four times a day (QID) | ORAL | Status: DC | PRN
  Administered 2024-08-21 – 2024-08-22 (×4): 1 via ORAL
  Filled 2024-08-21 (×4): qty 1

## 2024-08-21 NOTE — [place] FL2 (Signed)
 " [place]  MEDICAID FL2 LEVEL OF CARE FORM     IDENTIFICATION  Patient Name: Jennifer [name] Birthdate: [birth year] Sex: female Admission Date (Current Location): 08/13/2024  [place] and [place] Number:  [place] [id number] A Facility and Address:  [facility],  [address], [place] [address]      Provider Number: [id number]  Attending Physician Name and Address:  Christobal Guadalajara, MD  Relative Name and Phone Number:  Niccole Witthuhn (mother) [phone]    Current Level of Care: Hospital Recommended Level of Care: Skilled Nursing Facility Prior Approval Number:    Date Approved/Denied:   PASRR Number: [id number]  Discharge Plan: SNF    Current Diagnoses: Patient Active Problem List   Diagnosis Date Noted   Acute upper GI bleeding 08/14/2024   Elevated troponin 08/14/2024   Chest pain at rest 08/14/2024   AKI (acute kidney injury) 08/13/2024   Esophageal candidiasis (HCC) 08/13/2024   Gastric ulcer 08/13/2024   Gastritis 08/13/2024   Upper GI bleed 08/12/2024   Mixed hyperlipidemia 05/24/2024   Mass of skin of right shoulder 02/20/2022   Chronic maxillary sinusitis 09/10/2021   Need for COVID-19 vaccine 09/10/2021   Essential hypertension 08/21/2021   Class 1 obesity due to excess calories with serious comorbidity and body mass index (BMI) of 34.0 to 34.9 in adult 08/21/2021   Alcohol use disorder, moderate, dependence (HCC)    Current smoker 08/12/2016   Homeless single person 08/12/2016   Skin lesion of left leg 03/29/2015   Trigger middle finger of right hand 01/11/2015   Discoloration of eye 01/11/2015   Bradycardia 01/11/2015   Skin lesion of scalp 07/17/2014   Leg swelling 05/20/2013   Vasomotor rhinitis 08/17/2012   Right leg pain 07/26/2012   Tooth ache 05/19/2012   Anxiety 02/26/2012   GERD (gastroesophageal reflux disease) 08/08/2011   Menopausal symptoms 07/23/2011   Mastalgia 07/23/2011   Bipolar disorder, current episode manic  without psychotic features, moderate (HCC) 12/05/2010   HORDEOLUM EXTERNUM 08/27/2010   SEBACEOUS CYST, SCALP 05/06/2010   SYNOVIAL CYST 05/03/2010   Abdominal pain 12/06/2009   DRUG ABUSE, HX OF 12/06/2009   Lipoma of shoulder 02/06/2009   FIBROIDS, UTERUS 02/06/2009   DRUG ABUSE 02/06/2009   Essential hypertension, benign 02/06/2009   PAIN IN JOINT, SHOULDER REGION 02/06/2009   Insomnia 02/06/2009   PELVIC MASS 02/06/2009    Orientation RESPIRATION BLADDER Height & Weight     Self, Time, Situation, Place  Normal Continent Weight: 81.8 kg Height:  5' 4 (162.6 cm)  BEHAVIORAL SYMPTOMS/MOOD NEUROLOGICAL BOWEL NUTRITION STATUS      Continent Diet (soft)  AMBULATORY STATUS COMMUNICATION OF NEEDS Skin   Limited Assist Verbally Normal (dry, flaky)                       Personal Care Assistance Level of Assistance  Bathing, Feeding, Dressing Bathing Assistance: Limited assistance Feeding assistance: Independent Dressing Assistance: Limited assistance     Functional Limitations Info  Sight, Hearing, Speech Sight Info: Adequate Hearing Info: Adequate Speech Info: Adequate    SPECIAL CARE FACTORS FREQUENCY  PT (By licensed PT), OT (By licensed OT)     PT Frequency: 5x/week OT Frequency: 5x/week            Contractures Contractures Info: Not present    Additional Factors Info  Code Status, Allergies Code Status Info: Full Code Allergies Info: NKDA           Current Medications (08/21/2024):  This is the current hospital active medication list Current Facility-Administered Medications  Medication Dose Route Frequency Provider Last Rate Last Admin   acetaminophen  (TYLENOL ) tablet 650 mg  650 mg Oral Q6H PRN Patel, Vishal R, MD   650 mg at 08/21/24 1238   Or   acetaminophen  (TYLENOL ) suppository 650 mg  650 mg Rectal Q6H PRN Patel, Vishal R, MD       dicyclomine  (BENTYL ) tablet 20 mg  20 mg Oral TID AC Kc, Ramesh, MD   20 mg at 08/21/24 1104   fluconazole   (DIFLUCAN ) tablet 400 mg  400 mg Oral Daily Kc, Mennie, MD   400 mg at 08/21/24 9167   folic acid  (FOLVITE ) tablet 1 mg  1 mg Oral Daily Sebastian Toribio GAILS, MD   1 mg at 08/21/24 9166   guaiFENesin  (ROBITUSSIN) 100 MG/5ML liquid 5 mL  5 mL Oral Q4H PRN Sebastian Toribio GAILS, MD   5 mL at 08/21/24 9166   melatonin tablet 3 mg  3 mg Oral QHS PRN Patel, Vishal R, MD   3 mg at 08/20/24 2003   metoprolol  succinate (TOPROL -XL) 24 hr tablet 50 mg  50 mg Oral Daily Sebastian Toribio GAILS, MD   50 mg at 08/21/24 9167   multivitamin with minerals tablet 1 tablet  1 tablet Oral Daily Sebastian Toribio GAILS, MD   1 tablet at 08/21/24 9167   ondansetron  (ZOFRAN ) tablet 4 mg  4 mg Oral Q6H PRN Patel, Vishal R, MD   4 mg at 08/15/24 2227   Or   ondansetron  (ZOFRAN ) injection 4 mg  4 mg Intravenous Q6H PRN Patel, Vishal R, MD       oxyCODONE -acetaminophen  (PERCOCET/ROXICET) 5-325 MG per tablet 1 tablet  1 tablet Oral Q6H PRN Kc, Mennie, MD   1 tablet at 08/21/24 1515   pantoprazole  (PROTONIX ) EC tablet 40 mg  40 mg Oral BID Kc, Mennie, MD   40 mg at 08/21/24 0833   polyethylene glycol (MIRALAX  / GLYCOLAX ) packet 17 g  17 g Oral Daily PRN Patel, Vishal R, MD   17 g at 08/21/24 9166   senna-docusate (Senokot-S) tablet 1 tablet  1 tablet Oral QHS Sebastian Toribio GAILS, MD   1 tablet at 08/20/24 2158   sodium chloride  flush (NS) 0.9 % injection 3 mL  3 mL Intravenous Q12H Patel, Vishal R, MD   3 mL at 08/21/24 9166   thiamine  (VITAMIN B1) tablet 100 mg  100 mg Oral Daily Sebastian Toribio GAILS, MD   100 mg at 08/21/24 9166   Or   thiamine  (VITAMIN B1) injection 100 mg  100 mg Intravenous Daily Sebastian Toribio GAILS, MD   100 mg at 08/15/24 1330     Discharge Medications: Please see discharge summary for a list of discharge medications.  Relevant Imaging Results:  Relevant Lab Results:   Additional Information SSN [id number]  Sonda Manuella Quill, RN     "

## 2024-08-21 NOTE — Progress Notes (Signed)
 Mobility Specialist - Progress Note   08/21/24 1047  Mobility  Activity Ambulated with assistance  Level of Assistance Contact guard assist, steadying assist  Assistive Device Front wheel walker  Distance Ambulated (ft) 70 ft  Range of Motion/Exercises Active  Activity Response Tolerated fair  Mobility visit 1 Mobility  Mobility Specialist Start Time (ACUTE ONLY) 1030  Mobility Specialist Stop Time (ACUTE ONLY) 1047  Mobility Specialist Time Calculation (min) (ACUTE ONLY) 17 min   Pt was found sitting EOB and agreeable to mobilize. C/o pain with ambulation. At EOS returned to sit EOB with all needs met. Call bell in reach. RN notifed pt requesting pain medication.   Erminio Leos,  Mobility Specialist Can be reached via Secure Chat

## 2024-08-21 NOTE — TOC Initial Note (Signed)
 Transition of Care [facility]) - Initial/Assessment Note    Patient Details  Name: Jennifer [name] MRN: [id number] Date of Birth: November 03, 1960  Transition of [facility]) CM/SW Contact:    Sonda Manuella Quill, RN Phone Number: 08/21/2024, 5:52 PM  Clinical Narrative:                 PT/OT recc SNF; spoke w/ pt in room; pt said she lives in a shed that was converted into tiny home; she plans to d/c to SNF at d/c; transportation TBD; pt identified POC mother [name] [phone]); insurance/PCP verified; she denied SDOH risks; pt said she does not DME, HH services, or home oxygen; pt said she does not have glasses, dentures, or HA; she is continent of bowel and bladder; explained SNF process and ins auth needed; pt verbalized understanding, and said she does not have a facility preference; obtained PASSR # [id number]; FL2 sent for co-signed, faxed out for bed; awaiting offers.  Expected Discharge Plan: Skilled Nursing Facility Barriers to Discharge: Continued Medical Work up   Patient Goals and CMS Choice Patient states their goals for this hospitalization and ongoing recovery are:: short-term rehab     [place] ownership interest in [facility].provided to:: Patient    Expected Discharge Plan and Services   Discharge Planning Services: CM Consult   Living arrangements for the past 2 months: Single Family Home                 DME Arranged: N/A DME Agency: NA       HH Arranged: NA HH Agency: NA        Prior Living Arrangements/Services Living arrangements for the past 2 months: Single Family Home Lives with:: Pets, Self Patient language and need for interpreter reviewed:: Yes Do you feel safe going back to the place where you live?: Yes      Need for Family Participation in Patient Care: Yes (Comment) Care giver support system in place?: Yes (comment) Current home services:  (n/a) Criminal Activity/Legal Involvement Pertinent to Current  Situation/Hospitalization: No - Comment as needed  Activities of Daily Living   ADL Screening (condition at time of admission) Independently performs ADLs?: Yes (appropriate for developmental age) Is the patient deaf or have difficulty hearing?: No Does the patient have difficulty seeing, even when wearing glasses/contacts?: No Does the patient have difficulty concentrating, remembering, or making decisions?: No  Permission Sought/Granted Permission sought to share information with : Case Manager Permission granted to share information with : Yes, Verbal Permission Granted  Share Information with NAME: Case Manager     Permission granted to share info w Relationship: Britten Parady (mother) [phone]     Emotional Assessment Appearance:: Appears stated age Attitude/Demeanor/Rapport: Gracious Affect (typically observed): Accepting Orientation: : Oriented to Self, Oriented to Place, Oriented to  Time, Oriented to Situation Alcohol / Substance Use: Not Applicable Psych Involvement: No (comment)  Admission diagnosis:  Acute upper GI bleeding [K92.2] Elevated troponin [R79.89] AKI (acute kidney injury) [N17.9] Patient Active Problem List   Diagnosis Date Noted   Acute upper GI bleeding 08/14/2024   Elevated troponin 08/14/2024   Chest pain at rest 08/14/2024   AKI (acute kidney injury) 08/13/2024   Esophageal candidiasis (HCC) 08/13/2024   Gastric ulcer 08/13/2024   Gastritis 08/13/2024   Upper GI bleed 08/12/2024   Mixed hyperlipidemia 05/24/2024   Mass of skin of right shoulder 02/20/2022   Chronic maxillary sinusitis 09/10/2021   Need for COVID-19 vaccine 09/10/2021  Essential hypertension 08/21/2021   Class 1 obesity due to excess calories with serious comorbidity and body mass index (BMI) of 34.0 to 34.9 in adult 08/21/2021   Alcohol use disorder, moderate, dependence (HCC)    Current smoker 08/12/2016   Homeless single person 08/12/2016   Skin lesion of left leg  03/29/2015   Trigger middle finger of right hand 01/11/2015   Discoloration of eye 01/11/2015   Bradycardia 01/11/2015   Skin lesion of scalp 07/17/2014   Leg swelling 05/20/2013   Vasomotor rhinitis 08/17/2012   Right leg pain 07/26/2012   Tooth ache 05/19/2012   Anxiety 02/26/2012   GERD (gastroesophageal reflux disease) 08/08/2011   Menopausal symptoms 07/23/2011   Mastalgia 07/23/2011   Bipolar disorder, current episode manic without psychotic features, moderate (HCC) 12/05/2010   HORDEOLUM EXTERNUM 08/27/2010   SEBACEOUS CYST, SCALP 05/06/2010   SYNOVIAL CYST 05/03/2010   Abdominal pain 12/06/2009   DRUG ABUSE, HX OF 12/06/2009   Lipoma of shoulder 02/06/2009   FIBROIDS, UTERUS 02/06/2009   DRUG ABUSE 02/06/2009   Essential hypertension, benign 02/06/2009   PAIN IN JOINT, SHOULDER REGION 02/06/2009   Insomnia 02/06/2009   PELVIC MASS 02/06/2009   PCP:  Delbert Clam, MD Pharmacy:   [facility] [address] [place] [address] Phone: [phone] Fax: [phone]  [facility] [address] [place] [address] Phone: [phone] Fax: [phone]  [facility] [id number] [place], [place] - [address] [place] [address] Phone: [phone] Fax: [phone]     Social Drivers of Health (SDOH) Social History: SDOH Screenings   Food Insecurity: No Food Insecurity (08/14/2024)  Housing: Low Risk (08/14/2024)  Transportation Needs: Unmet Transportation Needs (08/14/2024)  Utilities: Not At Risk (08/14/2024)  Depression (PHQ2-9): Low Risk (05/24/2024)  Recent Concern: Depression (PHQ2-9) - Medium Risk (03/04/2024)  Tobacco Use: High Risk (08/13/2024)   SDOH Interventions:     Readmission Risk Interventions     No data to display

## 2024-08-21 NOTE — Progress Notes (Signed)
 " PROGRESS NOTE Jennifer [name]  FMW:[id number] DOB: [birth year] DOA: 08/13/2024 PCP: Delbert Clam, MD  Brief Narrative/Hospital Course: Jennifer [name] is a 64 y.o. female with PMH of  HTN, BPD, substance use disorder, tobacco use who presented to the ED for evaluation of abdominal pain and black stools. Patient was initially admitted on 08/12/2024 with melena secondary to upper GI bleeding, seen by GI and underwent EGD1/10/2024-showed esophageal plaques suspicious for candidiasis, nonbleeding gastric ulcer with pigmented material, and gastritis.  Unfortunately left AMA after the EGD.  It was noted that creatinine worsened from 1.19 to 1.63 on repeat morning labs.  Patient seen again in the ED with worsening AKI, placed on IV fluids and admitted Patient placed on IV Diflucan , IV PPI.  GI was consulted-per GI continue diet as tolerated complete 2 weeks course of Diflucan , PPI twice daily and 6 to 8 weeks follow-up.  At this time patient is stable  Subjective: Seen and examined  Was ambulating in the hall with PT.  No new complaints Overnight afebrile vital stable.  Assessment and plan   Upper GI bleed due to gastric ulcer/gastritis Large gastric ulcer Esophageal candidiasis Recent admission 1/2 and EGD 1/3-left AMA 1/3: EGD 08/13/2024-esophageal plaques, large nonbleeding gastric ulcer with pigmented material and gastritis.PTA on Aleve  nightly- discontinued. CT chest abdomen pelvis on admission-no acute finding. hemoglobin fairly stable, tolerating diet.  Per GI complete 2 weeks course of Diflucan , continue PPI bid, cont supportive care Follow-up with GI  in 6 to 8 weeks with [facility]. Recent Labs  Lab 08/14/24 1436 08/15/24 0345 08/16/24 0411 08/17/24 0357  HGB 12.5 13.0 12.1 10.8*  HCT 38.6 39.4 36.8 33.4*    AKI Mild metabolic acidosis-resolved: Multifactorial, prerenal azotemia +/-NSAIDl. Aki resolved. Renal us -no acute findings, inferior pole right kidney stone measuring 5  mm.  Mild thrombocytopenia: Likely in the setting of acute illness.  Stable   Elevated troponin 121>>> 127: Chest pain on presentation but pointing towards upper abdomen likely from gastritis and esophagitis CT chest abdomen and pelvis done on admission with no acute findings, no evidence of bowel perforation, gastric wall thickening in the distal stomach similar to prior study.  Esophageal dilation suggest chronic esophageal dysmotility such as achalasia, similar to prior studies.   EKG done with diffuse T wave inversions, nonspecific ST depressions unchanged from prior EKG. Likely elevated troponin secondary to demand ischemia in the setting of GI bleed, dehydration and AKI. Underwent TTE showing EF > 75%, NWMA, severe LVH- HCM-seen by cardiology to advise additional workup will need outpatient follow-up ? cardiac MRI.   Hypertension: BP stable cont metoprolol .  History of substance use disorder/history of alcohol use Tobacco abuse: Reports occasional alcohol use, UDS positive for multiple substances reported Patient was placed on Ativan  withdrawal protocol however patient significantly drowsy after receiving IV Ativan -discontinued.   Continue nicotine patch  Deconditioning/debility Weakness: Continue PT eval, encouraged mobilization PT Follow up Rec: Skilled Nursing-Short Term Rehab (<3 Hours/Day)08/19/2024 1346    Class I Obesity w/ Body mass index is 30.95 kg/m.: Will benefit with PCP follow-up, weight loss,healthy lifestyle  DVT prophylaxis: Place and maintain sequential compression device Start: 08/14/24 0926 SCDs Start: 08/14/24 0128 no chemical prophylaxis due to bleeding and anemia Code Status:   Code Status: Full Code Family Communication: plan of care discussed with patient at bedside. Patient status is: Remains hospitalized because of severity of illness Level of care: Telemetry   Dispo: The patient is from: home  Anticipated disposition: Awaiting for  SNF.She is medically stable discharge.  TOC notified  Objective: Vitals last 24 hrs: Vitals:   08/20/24 1444 08/20/24 2049 08/20/24 2050 08/21/24 0455  BP: 127/81 111/71  116/74  Pulse: 85 84 83 81  Resp: 20 20  16   Temp: 99.6 F (37.6 C) 98.8 F (37.1 C)  99.5 F (37.5 C)  TempSrc: Oral Oral  Oral  SpO2: 94% (!) 88% 92% 94%  Weight:      Height:       Physical Examination: General exam: AAOX3 HEENT:Oral mucosa moist, Ear/Nose WNL grossly Respiratory system: Bilaterally clea,no use of accessory muscle Cardiovascular system: S1 & S2 +, No JVD. Gastrointestinal system: Abdomen soft, I will generalized tenderness ,ND, BS+ Nervous System: Alert, awake, moving all extremities,and following commands. Extremities: extremities warm, leg edema neg. Skin: Warm, no rashes MSK: Normal muscle bulk,tone, power.   Medications reviewed:  Scheduled Meds:  dicyclomine   20 mg Oral TID AC   fluconazole   400 mg Oral Daily   folic acid   1 mg Oral Daily   metoprolol  succinate  50 mg Oral Daily   multivitamin with minerals  1 tablet Oral Daily   pantoprazole   40 mg Oral BID   senna-docusate  1 tablet Oral QHS   sodium chloride  flush  3 mL Intravenous Q12H   thiamine   100 mg Oral Daily   Or   thiamine   100 mg Intravenous Daily  Continuous Infusions:  Diet: Diet Order             DIET SOFT Room service appropriate? Yes; Fluid consistency: Thin  Diet effective now                   Data Reviewed: I have personally reviewed following labs and imaging studies ( see epic result tab) CBC: Recent Labs  Lab 08/14/24 1436 08/15/24 0345 08/16/24 0411 08/17/24 0357  WBC  --  8.6 8.1 9.6  NEUTROABS  --   --   --  7.9*  HGB 12.5 13.0 12.1 10.8*  HCT 38.6 39.4 36.8 33.4*  MCV  --  89.3 90.0 89.8  PLT  --  149* 131* 141*   CMP: Recent Labs  Lab 08/15/24 0345 08/16/24 0411 08/17/24 0357  NA 142 142 142  K 4.7 5.0 4.6  CL 107 109 108  CO2 23 21* 23  GLUCOSE 95 99 118*  BUN 27*  27* 18  CREATININE 1.09* 1.00 1.00  CALCIUM  9.9 10.0 9.7  MG  --   --  2.3   GFR: Estimated Creatinine Clearance: 61.1 mL/min (by C-G formula based on SCr of 1 mg/dL). No results for input(s): AST, ALT, ALKPHOS, BILITOT, PROT, ALBUMIN in the last 168 hours.   No results for input(s): LIPASE, AMYLASE in the last 168 hours.  No results for input(s): AMMONIA in the last 168 hours. Coagulation Profile:  No results for input(s): INR, PROTIME in the last 168 hours.  Unresulted Labs (From admission, onward)    None      Antimicrobials/Microbiology: Anti-infectives (From admission, onward)    Start     Dose/Rate Route Frequency Ordered Stop   08/19/24 1000  fluconazole  (DIFLUCAN ) tablet 400 mg        400 mg Oral Daily 08/18/24 1155 08/28/24 0959   08/16/24 0600  fluconazole  (DIFLUCAN ) IVPB 400 mg  Status:  Discontinued        400 mg 100 mL/hr over 120 Minutes Intravenous Every 24 hours 08/15/24 0824 08/18/24  1155   08/15/24 0500  fluconazole  (DIFLUCAN ) IVPB 200 mg  Status:  Discontinued        200 mg 100 mL/hr over 60 Minutes Intravenous Every 24 hours 08/14/24 0128 08/15/24 0824   08/14/24 0200  fluconazole  (DIFLUCAN ) IVPB 400 mg        400 mg 100 mL/hr over 120 Minutes Intravenous  Once 08/14/24 0144 08/14/24 1328         Component Value Date/Time   SDES  09/08/2023 1424    BLOOD LEFT FOREARM Performed at [facility], [address], [place], [place] [address]    SPECREQUEST  09/08/2023 1424    BOTTLES DRAWN AEROBIC AND ANAEROBIC Blood Culture results may not be optimal due to an inadequate volume of blood received in culture bottles Performed at [facility], [address], [place], [place] [address]    CULT  09/08/2023 1424    NO GROWTH 5 DAYS Performed at [facility], [address], [place], [place] [address]    REPTSTATUS 09/13/2023 FINAL 09/08/2023 1424    Procedures:  Mennie LAMY, MD [facility] 08/21/2024, 11:48 AM   "

## 2024-08-22 ENCOUNTER — Other Ambulatory Visit (HOSPITAL_COMMUNITY): Payer: Self-pay

## 2024-08-22 MED ORDER — OXYCODONE-ACETAMINOPHEN 5-325 MG PO TABS
1.0000 | ORAL_TABLET | Freq: Four times a day (QID) | ORAL | 0 refills | Status: AC | PRN
  Filled 2024-08-22: qty 10, 3d supply, fill #0

## 2024-08-22 MED ORDER — PANTOPRAZOLE SODIUM 40 MG PO TBEC: 40.0000 mg | DELAYED_RELEASE_TABLET | Freq: Two times a day (BID) | ORAL | Status: DC

## 2024-08-22 MED ORDER — OXYCODONE-ACETAMINOPHEN 5-325 MG PO TABS: 3.0000 | ORAL_TABLET | Freq: Four times a day (QID) | ORAL | 0 refills | Status: DC | PRN

## 2024-08-22 MED ORDER — PANTOPRAZOLE SODIUM 40 MG PO TBEC
40.0000 mg | DELAYED_RELEASE_TABLET | Freq: Two times a day (BID) | ORAL | 1 refills | Status: AC
  Filled 2024-08-22: qty 60, 30d supply, fill #0

## 2024-08-22 MED ORDER — FOLIC ACID 1 MG PO TABS: 1.0000 mg | ORAL_TABLET | Freq: Every day | ORAL | Status: DC

## 2024-08-22 MED ORDER — HYDROMORPHONE HCL 2 MG PO TABS
1.0000 mg | ORAL_TABLET | Freq: Four times a day (QID) | ORAL | Status: DC | PRN
  Administered 2024-08-22: 1 mg via ORAL
  Filled 2024-08-22: qty 1

## 2024-08-22 MED ORDER — VITAMIN B-1 100 MG PO TABS
100.0000 mg | ORAL_TABLET | Freq: Every day | ORAL | 0 refills | Status: AC
  Filled 2024-08-22: qty 30, 30d supply, fill #0

## 2024-08-22 MED ORDER — FLUCONAZOLE 200 MG PO TABS: 400.0000 mg | ORAL_TABLET | Freq: Every day | ORAL | 0 refills | Status: DC

## 2024-08-22 MED ORDER — DICYCLOMINE HCL 20 MG PO TABS: 20.0000 mg | ORAL_TABLET | Freq: Three times a day (TID) | ORAL | Status: DC | PRN

## 2024-08-22 MED ORDER — DICYCLOMINE HCL 20 MG PO TABS
20.0000 mg | ORAL_TABLET | Freq: Three times a day (TID) | ORAL | 0 refills | Status: AC | PRN
  Filled 2024-08-22: qty 30, 10d supply, fill #0

## 2024-08-22 MED ORDER — FOLIC ACID 1 MG PO TABS
1.0000 mg | ORAL_TABLET | Freq: Every day | ORAL | 0 refills | Status: AC
  Filled 2024-08-22: qty 30, 30d supply, fill #0

## 2024-08-22 MED ORDER — VITAMIN B-1 100 MG PO TABS: 100.0000 mg | ORAL_TABLET | Freq: Every day | ORAL | Status: DC

## 2024-08-22 MED ORDER — FLUCONAZOLE 200 MG PO TABS
400.0000 mg | ORAL_TABLET | Freq: Every day | ORAL | 0 refills | Status: AC
  Filled 2024-08-22: qty 12, 6d supply, fill #0

## 2024-08-22 NOTE — TOC Transition Note (Signed)
 Transition of Care [facility]) - Discharge Note   Patient Details  Name: Jennifer [name] MRN: [id number] Date of Birth: 10/09/1960  Transition of [facility]) CM/SW Contact:  Tawni CHRISTELLA Eva, LCSW Phone Number: 08/22/2024, 12:24 PM   Clinical Narrative:     Pt has no SNF bed offers at this time and will have to d/c home. CSW spoke with pt to discuss; pt was informed of the barriers to placement and that pt will have to return home. CSW attempted to arrange home health services; however, no agency is able to accept at this time. Pt will need to follow up with outpatient PT/OT services. Pt is requesting a rolling walker and a taxi voucher home. CSW sent a referral to [employer] for a rolling walker to be delivered to pts room prior to discharge. Pt was provided with a taxi voucher to [address], [place], [place]. Referral for outpatient PT/OT was made. No further needs at this time. ICM signed off.  Final next level of care: Home/Self Care Barriers to Discharge: Barriers Resolved   Patient Goals and CMS Choice Patient states their goals for this hospitalization and ongoing recovery are:: retrun home     [place] ownership interest in [facility].provided to:: Patient    Discharge Placement                    Patient and family notified of of transfer: 08/22/24  Discharge Plan and Services Additional resources added to the After Visit Summary for     Discharge Planning Services: CM Consult            DME Arranged: Vannie rolling DME Agency: [employer] Date DME Agency Contacted: 08/22/24 Time DME Agency Contacted: 1224 Representative spoke with at DME Agency: jermaine HH Arranged: NA HH Agency: NA        Social Drivers of Health (SDOH) Interventions SDOH Screenings   Food Insecurity: No Food Insecurity (08/14/2024)  Housing: Low Risk (08/14/2024)  Transportation Needs: Unmet Transportation Needs (08/14/2024)  Utilities: Not At Risk (08/14/2024)   Depression (PHQ2-9): Low Risk (05/24/2024)  Recent Concern: Depression (PHQ2-9) - Medium Risk (03/04/2024)  Tobacco Use: High Risk (08/13/2024)     Readmission Risk Interventions     No data to display

## 2024-08-22 NOTE — Progress Notes (Signed)
 " PROGRESS NOTE Jennifer [name]  FMW:[id number] DOB: 1960/09/08 DOA: 08/13/2024 PCP: Jennifer Clam, MD  Brief Narrative/Hospital Course: Jennifer [name] is a 64 y.o. female with PMH of  HTN, BPD, substance use disorder, tobacco use who presented to the ED for evaluation of abdominal pain and black stools. Patient was initially admitted on 08/12/2024 with melena secondary to upper GI bleeding, seen by GI and underwent EGD1/10/2024-showed esophageal plaques suspicious for candidiasis, nonbleeding gastric ulcer with pigmented material, and gastritis.  Unfortunately left AMA after the EGD.  It was noted that creatinine worsened from 1.19 to 1.63 on repeat morning labs.  Patient seen again in the ED with worsening AKI, placed on IV fluids and admitted Patient placed on IV Diflucan , IV PPI.  GI was consulted-per GI continue diet as tolerated complete 2 weeks course of Diflucan , PPI twice daily and 6 to 8 weeks follow-up.  At this time patient is stable  Subjective: Seen and examined  Asking for pain medication, Overnight vitals stable afebrile Waiting for placement, lost IV access and managing an oral regimen   Assessment and plan   Upper GI bleed due to gastric ulcer/gastritis Large gastric ulcer Esophageal candidiasis Recent admission 1/2 and EGD 1/3-left AMA 1/3: EGD 08/13/2024-esophageal plaques, large nonbleeding gastric ulcer with pigmented material and gastritis.PTA on Aleve  nightly- discontinued. CT chest abdomen pelvis on admission-no acute finding. hemoglobin fairly stable, tolerating diet.  Per GI complete 2 weeks course of Diflucan , continue PPI bid, cont supportive care Follow-up with GI  in 6 to 8 weeks with [facility]. Recent Labs  Lab 08/16/24 0411 08/17/24 0357  HGB 12.1 10.8*  HCT 36.8 33.4*    AKI Mild metabolic acidosis-resolved: Multifactorial, prerenal azotemia +/-NSAIDl. Aki resolved. Renal us -no acute findings, inferior pole right kidney stone measuring 5 mm.  Mild  thrombocytopenia: Likely in the setting of acute illness.  Stable   Elevated troponin 121>>> 127: Chest pain on presentation but pointing towards upper abdomen likely from gastritis and esophagitis CT chest abdomen and pelvis done on admission with no acute findings, no evidence of bowel perforation, gastric wall thickening in the distal stomach similar to prior study.  Esophageal dilation suggest chronic esophageal dysmotility such as achalasia, similar to prior studies.   EKG done with diffuse T wave inversions, nonspecific ST depressions unchanged from prior EKG. Likely elevated troponin secondary to demand ischemia in the setting of GI bleed, dehydration and AKI. Underwent TTE showing EF > 75%, NWMA, severe LVH- HCM-seen by cardiology to advise additional workup will need outpatient follow-up ? cardiac MRI.   Hypertension: BP stable cont metoprolol .  History of substance use disorder/history of alcohol use Tobacco abuse: Reports occasional alcohol use, UDS positive for multiple substances reported Patient was placed on Ativan  withdrawal protocol however patient significantly drowsy after receiving IV Ativan -discontinued.   Continue nicotine patch  Deconditioning/debility Weakness: Continue PT eval, encouraged mobilization PT Follow up Rec: Skilled Nursing-Short Term Rehab (<3 Hours/Day)08/19/2024 1346    Class I Obesity w/ Body mass index is 30.95 kg/m.: Will benefit with PCP follow-up, weight loss,healthy lifestyle  DVT prophylaxis: Place and maintain sequential compression device Start: 08/14/24 0926 SCDs Start: 08/14/24 0128 no chemical prophylaxis due to bleeding and anemia Code Status:   Code Status: Full Code Family Communication: plan of care discussed with patient at bedside. Patient status is: Remains hospitalized because of severity of illness Level of care: Telemetry   Dispo: The patient is from: home  Anticipated disposition: Awaiting for SNF.She is  medically stable discharge.  TOC notified  Objective: Vitals last 24 hrs: Vitals:   08/21/24 2036 08/21/24 2041 08/22/24 0301 08/22/24 0443  BP: (!) 97/57 (!) 99/57 127/81 113/70  Pulse: 71 67 77 80  Resp: (!) 24  19 (!) 24  Temp: 99.6 F (37.6 C)  99.4 F (37.4 C) 97.7 F (36.5 C)  TempSrc: Oral  Oral Oral  SpO2: 94%  93% 92%  Weight:      Height:       Physical Examination: General exam: AAOX3 HEENT:Oral mucosa moist, Ear/Nose WNL grossly Respiratory system: CTA B/l Cardiovascular system: S1 & S2 +, No JVD. Gastrointestinal system: Abdomen soft, mild tenderness present Nervous System: aaox3, moving all extremities,and following commands. Extremities: extremities warm, leg edema neg. Skin: Warm, no rashes MSK: Normal muscle bulk,tone, power.   Medications reviewed:  Scheduled Meds:  dicyclomine   20 mg Oral TID AC   fluconazole   400 mg Oral Daily   folic acid   1 mg Oral Daily   metoprolol  succinate  50 mg Oral Daily   multivitamin with minerals  1 tablet Oral Daily   pantoprazole   40 mg Oral BID   senna-docusate  1 tablet Oral QHS   sodium chloride  flush  3 mL Intravenous Q12H   thiamine   100 mg Oral Daily   Or   thiamine   100 mg Intravenous Daily  Continuous Infusions:  Diet: Diet Order             DIET SOFT Room service appropriate? Yes; Fluid consistency: Thin  Diet effective now                   Data Reviewed: I have personally reviewed following labs and imaging studies ( see epic result tab) CBC: Recent Labs  Lab 08/16/24 0411 08/17/24 0357  WBC 8.1 9.6  NEUTROABS  --  7.9*  HGB 12.1 10.8*  HCT 36.8 33.4*  MCV 90.0 89.8  PLT 131* 141*   CMP: Recent Labs  Lab 08/16/24 0411 08/17/24 0357  NA 142 142  K 5.0 4.6  CL 109 108  CO2 21* 23  GLUCOSE 99 118*  BUN 27* 18  CREATININE 1.00 1.00  CALCIUM  10.0 9.7  MG  --  2.3   GFR: Estimated Creatinine Clearance: 61.1 mL/min (by C-G formula based on SCr of 1 mg/dL). No results for  input(s): AST, ALT, ALKPHOS, BILITOT, PROT, ALBUMIN in the last 168 hours.   No results for input(s): LIPASE, AMYLASE in the last 168 hours.  No results for input(s): AMMONIA in the last 168 hours. Coagulation Profile:  No results for input(s): INR, PROTIME in the last 168 hours.  Unresulted Labs (From admission, onward)    None      Antimicrobials/Microbiology: Anti-infectives (From admission, onward)    Start     Dose/Rate Route Frequency Ordered Stop   08/19/24 1000  fluconazole  (DIFLUCAN ) tablet 400 mg        400 mg Oral Daily 08/18/24 1155 08/28/24 0959   08/16/24 0600  fluconazole  (DIFLUCAN ) IVPB 400 mg  Status:  Discontinued        400 mg 100 mL/hr over 120 Minutes Intravenous Every 24 hours 08/15/24 0824 08/18/24 1155   08/15/24 0500  fluconazole  (DIFLUCAN ) IVPB 200 mg  Status:  Discontinued        200 mg 100 mL/hr over 60 Minutes Intravenous Every 24 hours 08/14/24 0128 08/15/24 0824   08/14/24 0200  fluconazole  (DIFLUCAN )  IVPB 400 mg        400 mg 100 mL/hr over 120 Minutes Intravenous  Once 08/14/24 0144 08/14/24 1328         Component Value Date/Time   SDES  09/08/2023 1424    BLOOD LEFT FOREARM Performed at [facility], [address] [place], [place] [address]    SPECREQUEST  09/08/2023 1424    BOTTLES DRAWN AEROBIC AND ANAEROBIC Blood Culture results may not be optimal due to an inadequate volume of blood received in culture bottles Performed at [facility], [address], [place], [place] [address]    CULT  09/08/2023 1424    NO GROWTH 5 DAYS Performed at [facility], [address], [place], [place] [address]    REPTSTATUS 09/13/2023 FINAL 09/08/2023 1424    Procedures:  Mennie LAMY, MD [facility] 08/22/2024, 10:26 AM   "

## 2024-08-22 NOTE — TOC Progression Note (Signed)
 Transition of Care [facility]) - Progression Note    Patient Details  Name: Jennifer [name] MRN: [id number] Date of Birth: 07/01/1961  Transition of [facility]) CM/SW Contact  Tawni CHRISTELLA Eva, LCSW Phone Number: 08/22/2024, 11:47 AM  Clinical Narrative:     Pt with no bed offers at this time. ICM to follow.   Expected Discharge Plan: Skilled Nursing Facility Barriers to Discharge: Continued Medical Work up               Expected Discharge Plan and Services   Discharge Planning Services: CM Consult   Living arrangements for the past 2 months: Single Family Home                 DME Arranged: N/A DME Agency: NA       HH Arranged: NA HH Agency: NA         Social Drivers of Health (SDOH) Interventions SDOH Screenings   Food Insecurity: No Food Insecurity (08/14/2024)  Housing: Low Risk (08/14/2024)  Transportation Needs: Unmet Transportation Needs (08/14/2024)  Utilities: Not At Risk (08/14/2024)  Depression (PHQ2-9): Low Risk (05/24/2024)  Recent Concern: Depression (PHQ2-9) - Medium Risk (03/04/2024)  Tobacco Use: High Risk (08/13/2024)    Readmission Risk Interventions     No data to display

## 2024-08-22 NOTE — Progress Notes (Signed)
 Discharge Medications delivered from TOC meds to bed [facility] outpatient pharmacy by this RN.

## 2024-08-22 NOTE — Discharge Summary (Signed)
 Physician Discharge Summary  Jennifer [name] FMW:[id number] DOB: June 29, 1961 DOA: 08/13/2024  PCP: Delbert Clam, MD  Admit date: 08/13/2024 Discharge date: 08/22/2024 Recommendations for Outpatient Follow-up:  Follow up with PCP in 1 weeks-call for appointment Please obtain BMP/CBC in one week Follow-up with outpatient PT OT  Discharge Dispo: Home Discharge Condition: Stable Code Status:   Code Status: Full Code Diet recommendation:  Diet Order             DIET SOFT Room service appropriate? Yes; Fluid consistency: Thin  Diet effective now                    Brief/Interim Summary: Jennifer [name] is a 64 y.o. female with PMH of  HTN, BPD, substance use disorder, tobacco use who presented to the ED for evaluation of abdominal pain and black stools. Patient was initially admitted on 08/12/2024 with melena secondary to upper GI bleeding, seen by GI and underwent EGD1/10/2024-showed esophageal plaques suspicious for candidiasis, nonbleeding gastric ulcer with pigmented material, and gastritis.  Unfortunately left AMA after the EGD.  It was noted that creatinine worsened from 1.19 to 1.63 on repeat morning labs.  Patient seen again in the ED with worsening AKI, placed on IV fluids and admitted Patient placed on IV Diflucan , IV PPI.  GI was consulted-per GI continue diet as tolerated complete 2 weeks course of Diflucan , PPI twice daily and 6 to 8 weeks follow-up.  At this time patient is stable Waiting for placement.  TOC has informed SNF will not be an option given her insurance and UDS positive, this was discussed with the patient and she is agreeable to go home with outpatient PT, DME  Subjective: Seen and examined  Asking for pain medication, Overnight vitals stable afebrile Waiting for placement, lost IV access and managing an oral regimen  Discharge Diagnoses:    Upper GI bleed due to gastric ulcer/gastritis Large gastric ulcer Esophageal candidiasis Recent admission 1/2 and  EGD 1/3-left AMA 1/3: EGD 08/13/2024-esophageal plaques, large nonbleeding gastric ulcer with pigmented material and gastritis.PTA on Aleve  nightly- discontinued.CT chest abdomen pelvis on admission-no acute finding. hemoglobin fairly stable, tolerating diet. Compelte  Diflucan  x 2 wk,continue PPI bid, cont supportive care. Follow-up with GI  in 6 to 8 weeks with [facility]. Recent Labs  Lab 08/16/24 0411 08/17/24 0357  HGB 12.1 10.8*  HCT 36.8 33.4*    AKI Mild metabolic acidosis-resolved: Multifactorial, prerenal azotemia +/-NSAIDl. Aki resolved. Renal us -no acute findings, inferior pole right kidney stone measuring 5 mm.  Mild thrombocytopenia: Likely in the setting of acute illness.  Stable   Elevated troponin 121>>> 127: Chest pain on presentation but pointing towards upper abdomen likely from gastritis and esophagitis CT chest abdomen and pelvis done on admission with no acute findings, no evidence of bowel perforation, gastric wall thickening in the distal stomach similar to prior study.  Esophageal dilation suggest chronic esophageal dysmotility such as achalasia, similar to prior studies.   EKG done with diffuse T wave inversions, nonspecific ST depressions unchanged from prior EKG. Likely elevated troponin secondary to demand ischemia in the setting of GI bleed, dehydration and AKI. Underwent TTE showing EF > 75%, NWMA, severe LVH- HCM-seen by cardiology to advise additional workup will need outpatient follow-up ? cardiac MRI.   Hypertension: BP stable cont metoprolol .  History of substance use disorder/history of alcohol use Tobacco abuse: Reports occasional alcohol use, UDS positive for multiple substances reported Patient was placed on Ativan  withdrawal protocol however  patient significantly drowsy after receiving IV Ativan -discontinued.   Continue nicotine patch  Deconditioning/debility Weakness: Continue PT eval, encouraged mobilization PT Follow up Rec: Skilled  Nursing-Short Term Rehab (<3 Hours/Day)08/19/2024 1346    Class I Obesity w/ Body mass index is 30.95 kg/m.: Will benefit with PCP follow-up, weight loss,healthy lifestyle  DVT prophylaxis: Place and maintain sequential compression device Start: 08/14/24 0926 SCDs Start: 08/14/24 0128 no chemical prophylaxis due to bleeding and anemia Code Status:   Code Status: Full Code Family Communication: plan of care discussed with patient at bedside. Patient status is: Remains hospitalized because of severity of illness Level of care: Telemetry   Dispo: The patient is from: home            Anticipated disposition home   Objective: Vitals last 24 hrs: Vitals:   08/21/24 2036 08/21/24 2041 08/22/24 0301 08/22/24 0443  BP: (!) 97/57 (!) 99/57 127/81 113/70  Pulse: 71 67 77 80  Resp: (!) 24  19 (!) 24  Temp: 99.6 F (37.6 C)  99.4 F (37.4 C) 97.7 F (36.5 C)  TempSrc: Oral  Oral Oral  SpO2: 94%  93% 92%  Weight:      Height:       Physical Examination: General exam: AAOX3 HEENT:Oral mucosa moist, Ear/Nose WNL grossly Respiratory system: CTA B/l Cardiovascular system: S1 & S2 +, No JVD. Gastrointestinal system: Abdomen soft, mild tenderness present Nervous System: aaox3, moving all extremities,and following commands. Extremities: extremities warm, leg edema neg. Skin: Warm, no rashes MSK: Normal muscle bulk,tone, power.     Consultation: See note.  Discharge Instructions  Discharge Instructions     Ambulatory Referral for Lung Cancer Scre   Complete by: As directed    Ambulatory referral to Physical Therapy   Complete by: As directed    Discharge instructions   Complete by: As directed    Please call call MD or return to ER for similar or worsening recurring problem that brought you to hospital or if any fever,nausea/vomiting,abdominal pain, uncontrolled pain, chest pain,  shortness of breath or any other alarming symptoms.  Please follow-up your doctor as instructed in a  week time and call the office for appointment.  Please avoid alcohol, smoking, or any other illicit substance and maintain healthy habits including taking your regular medications as prescribed.  You were cared for by a hospitalist during your hospital stay. If you have any questions about your discharge medications or the care you received while you were in the hospital after you are discharged, you can call the unit and ask to speak with the hospitalist on call if the hospitalist that took care of you is not available.  Once you are discharged, your primary care physician will handle any further medical issues. Please note that NO REFILLS for any discharge medications will be authorized once you are discharged, as it is imperative that you return to your primary care physician (or establish a relationship with a primary care physician if you do not have one) for your aftercare needs so that they can reassess your need for medications and monitor your lab values   Increase activity slowly   Complete by: As directed       Allergies as of 08/22/2024   No Known Allergies      Medication List     PAUSE taking these medications    amLODipine  10 MG tablet Wait to take this until your doctor or other care provider tells you to start again. Commonly  known as: NORVASC  TAKE 1 TABLET (10 MG TOTAL) BY MOUTH DAILY (Office visit for future refills)   lisinopril -hydrochlorothiazide  20-12.5 MG tablet Wait to take this until your doctor or other care provider tells you to start again. Commonly known as: ZESTORETIC  Take 2 tablets by mouth daily.       STOP taking these medications    esomeprazole  40 MG capsule Commonly known as: NexIUM        TAKE these medications    atorvastatin  10 MG tablet Commonly known as: LIPITOR Take 1 tablet (10 mg total) by mouth daily.   Blood Pressure Cuff Misc 1 Units by Does not apply route daily.   dicyclomine  20 MG tablet Commonly known as:  BENTYL  Take 1 tablet (20 mg total) by mouth 3 (three) times daily as needed for spasms.   fluconazole  200 MG tablet Commonly known as: DIFLUCAN  Take 2 tablets (400 mg total) by mouth daily for 6 days.   folic acid  1 MG tablet Commonly known as: FOLVITE  Take 1 tablet (1 mg total) by mouth daily.   metoprolol  succinate 50 MG 24 hr tablet Commonly known as: TOPROL -XL Take 1 tablet (50 mg total) by mouth daily.   oxyCODONE -acetaminophen  5-325 MG tablet Commonly known as: PERCOCET/ROXICET Take 3 tablets by mouth every 6 (six) hours as needed for moderate pain (pain score 4-6).   pantoprazole  40 MG tablet Commonly known as: PROTONIX  Take 1 tablet (40 mg total) by mouth 2 (two) times daily.   thiamine  100 MG tablet Commonly known as: Vitamin B-1 Take 1 tablet (100 mg total) by mouth daily.               Durable Medical Equipment  (From admission, onward)           Start     Ordered   08/22/24 1226  For home use only DME Walker  Once       Question:  Patient needs a walker to treat with the following condition  Answer:  Physical deconditioning   08/22/24 1226            Follow-up Information     Delbert Clam, MD Follow up in 1 week(s).   Specialty: Family Medicine Contact information: [address] [place] [address] [phone]         Gastroenterology, Margarete. Call in 2 week(s).   Contact information: [address] [place] [address] [phone]                Allergies[1]  The results of significant diagnostics from this hospitalization (including imaging, microbiology, ancillary and laboratory) are listed below for reference.    Microbiology: Recent Results (from the past 240 hours)  Surgical PCR screen     Status: None   Collection Time: 08/13/24  3:31 AM   Specimen: Nasal Mucosa; Nasal Swab  Result Value Ref Range Status   MRSA, PCR NEGATIVE NEGATIVE Final   Staphylococcus aureus NEGATIVE  NEGATIVE Final    Comment: (NOTE) The Xpert SA Assay (FDA approved for NASAL specimens in patients 67 years of age and older), is one component of a comprehensive surveillance program. It is not intended to diagnose infection nor to guide or monitor treatment. Performed at [facility], [address] [place], [place] [address]     Procedures/Studies: ECHOCARDIOGRAM COMPLETE Result Date: 08/14/2024    ECHOCARDIOGRAM REPORT   Patient Name:   Jennifer [name] Date of Exam: 08/14/2024 Medical Rec #:  [id number]         Height:       64.0 in Accession #:    [id number]        Weight:       180.3 lb Date of Birth:  1960/11/14          BSA:          1.872 m Patient Age:    63 years          BP:           105/56 mmHg Patient Gender: F                 HR:           100 bpm. Exam Location:  Inpatient Procedure: 2D Echo, Cardiac Doppler and Color Doppler (Both Spectral and Color            Flow Doppler were utilized during procedure). Indications:    Chest Pain R07.9  History:        Patient has no prior history of Echocardiogram examinations.                 LVH; Risk Factors:Hypertension.  Sonographer:    Tinnie Gosling RDCS Referring Phys: [phone] DANIEL V THOMPSON IMPRESSIONS  1. Left ventricular ejection fraction, by estimation, is >75%. The left ventricle has hyperdynamic function. The left ventricle has no regional wall motion abnormalities. There is severe left ventricular hypertrophy of the apical segment. Left ventricular diastolic parameters are indeterminate.  2. Right ventricular systolic function is normal. The right ventricular size is normal. Tricuspid regurgitation signal is inadequate for assessing PA pressure.  3. The mitral valve is normal in structure. Trivial mitral valve regurgitation. No evidence of mitral stenosis.  4. The aortic valve has an indeterminant number of cusps. Aortic valve regurgitation is not visualized. No aortic stenosis is present.  5. The inferior vena cava is  normal in size with greater than 50% respiratory variability, suggesting right atrial pressure of 3 mmHg. Comparison(s): No prior Echocardiogram. 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 Recommend cardiac MRI as an outpatient for further evaluation of apical HCM. The velocity through the aortic valve is elevated, but this likely represents an increased LV gradient from HCM rather than aortic stenosis. Recommend repeat echocardiogram in ~2-3 years to make sure. FINDINGS  Left Ventricle: Left ventricular ejection fraction, by estimation, is >75%. The left ventricle has hyperdynamic function. The left ventricle has no regional wall motion abnormalities. The left ventricular internal cavity size was small. There is severe left ventricular hypertrophy of the apical segment. Left ventricular diastolic parameters are indeterminate. Right Ventricle: The right ventricular size is normal. No increase in right ventricular wall thickness. Right ventricular systolic function is normal. Tricuspid regurgitation signal is inadequate for assessing PA pressure. Left Atrium: Left atrial size was normal in size. Right Atrium: Right atrial size was normal in size. Pericardium: There is no evidence of pericardial effusion. Mitral Valve: The mitral valve is normal in structure. Trivial mitral valve regurgitation. No evidence of mitral valve stenosis. Tricuspid Valve: The tricuspid valve is normal in structure. Tricuspid valve regurgitation is not demonstrated. No evidence of tricuspid stenosis. Aortic Valve: The aortic valve has an indeterminant number of cusps. Aortic valve regurgitation is not visualized. No aortic stenosis is present. Aortic valve mean gradient measures 10.0 mmHg. Aortic valve peak gradient measures 19.0 mmHg. Aortic valve area, by VTI measures 3.53 cm. Pulmonic Valve: The pulmonic valve was normal in  structure. Pulmonic valve regurgitation is not  visualized. No evidence of pulmonic stenosis. Aorta: The aortic root and ascending aorta are structurally normal, with no evidence of dilitation. Venous: The inferior vena cava is normal in size with greater than 50% respiratory variability, suggesting right atrial pressure of 3 mmHg. IAS/Shunts: No atrial level shunt detected by color flow Doppler.  LEFT VENTRICLE PLAX 2D LVIDd:         4.60 cm   Diastology LVIDs:         2.20 cm   LV e' lateral:   9.03 cm/s LV PW:         1.40 cm   LV E/e' lateral: 9.7 LV IVS:        1.30 cm LVOT diam:     2.20 cm LV SV:         100 LV SV Index:   53 LVOT Area:     3.80 cm  RIGHT VENTRICLE         IVC TAPSE (M-mode): 2.3 cm  IVC diam: 1.20 cm LEFT ATRIUM             Index        RIGHT ATRIUM           Index LA diam:        3.30 cm 1.76 cm/m   RA Area:     12.90 cm LA Vol (A2C):   64.8 ml 34.61 ml/m  RA Volume:   27.00 ml  14.42 ml/m LA Vol (A4C):   46.4 ml 24.78 ml/m LA Biplane Vol: 54.9 ml 29.32 ml/m  AORTIC VALVE AV Area (Vmax):    3.38 cm AV Area (Vmean):   3.91 cm AV Area (VTI):     3.53 cm AV Vmax:           218.00 cm/s AV Vmean:          145.000 cm/s AV VTI:            0.283 m AV Peak Grad:      19.0 mmHg AV Mean Grad:      10.0 mmHg LVOT Vmax:         194.00 cm/s LVOT Vmean:        149.000 cm/s LVOT VTI:          0.263 m LVOT/AV VTI ratio: 0.93  AORTA Ao Root diam: 2.90 cm Ao Asc diam:  3.40 cm MITRAL VALVE MV Area (PHT): 6.88 cm    SHUNTS MV E velocity: 87.90 cm/s  Systemic VTI:  0.26 m MV A velocity: 87.30 cm/s  Systemic Diam: 2.20 cm MV E/A ratio:  1.01 Georganna Archer Electronically signed by Georganna Archer Signature Date/Time: 08/14/2024/3:27:18 PM    Final    DG Abd Portable 1V Result Date: 08/14/2024 EXAM: 1 VIEW XRAY OF THE ABDOMEN 08/14/2024 09:11:00 AM COMPARISON: CT chest abdomen and pelvis 08/13/2024. CLINICAL HISTORY: Abdominal pain FINDINGS: BOWEL: There is a paucity of bowel gas within the abdomen and pelvis, which limits assessment for underlying  bowel pathology, specifically bowel obstruction. Within this limitation, no dilated bowel loops are noted. The paucity of bowel gas could represent a non-obstructive process, early or incomplete obstruction not yet causing significant dilation, or reduced bowel motility. SOFT TISSUES: A previous 3 mm right lower pole renal calculus is not currently seen. BONES: No acute fracture. IMPRESSION: 1. Paucity of bowel gas limits assessment for bowel obstruction, with no dilated bowel loops identified. 2. A previous 3 mm right lower  pole renal calculus is not currently seen. This could indicate it has passed, is too small to be visualized on plain film, or is obscured. If there is a clinical concern for ureteral calculi, particularly if the patient has renal colic, consider further evaluation with a renal stone protocol CT. Electronically signed by: Waddell Calk MD 08/14/2024 09:18 AM EST RP Workstation: [id number]   US  RENAL Result Date: 08/14/2024 EXAM: US  Retroperitoneum Complete, Renal. 08/14/2024 07:14:38 AM TECHNIQUE: Real-time ultrasonography of the retroperitoneum renal was performed. COMPARISON: CT 08/13/2024 CLINICAL HISTORY: 409830 AKI (acute kidney injury) 409830 AKI (acute kidney injury) FINDINGS: FINDINGS: RIGHT KIDNEY/URETER: The right kidney measures 11.2 x 5.3 x 6.1 cm with a volume of 187.6 cc. Normal cortical echogenicity. No hydronephrosis. Inferior pole right kidney stone measures 5 mm. No mass. LEFT KIDNEY/URETER: Left kidney measures 11.0 x 5.3 x 5.9 cm with a volume of 180.4 cc. Normal cortical echogenicity. No hydronephrosis. No calculus. No mass. BLADDER: Urinary bladder is decompressed. Patient voided prior to exam. IMPRESSION: 1. No acute findings. 2. Inferior pole right kidney stone measuring 5 mm. Electronically signed by: Waddell Calk MD 08/14/2024 07:21 AM EST RP Workstation: [id number]   CT CHEST ABDOMEN PELVIS WO CONTRAST Result Date: 08/13/2024 EXAM: CT CHEST, ABDOMEN AND PELVIS  WITHOUT CONTRAST 08/13/2024 11:46:00 PM TECHNIQUE: CT of the chest, abdomen and pelvis was performed without the administration of intravenous contrast. Multiplanar reformatted images are provided for review. Automated exposure control, iterative reconstruction, and/or weight based adjustment of the mA/kV was utilized to reduce the radiation dose to as low as reasonably achievable. COMPARISON: Chest radiograph 08/13/2024, CT abdomen and pelvis 08/12/2024, and CT chest 08/10/2023. CLINICAL HISTORY: Abdominal pain, acute, nonlocalized; post EGD and has severe pain. FINDINGS: CHEST: MEDIASTINUM AND LYMPH NODES: Heart and pericardium are unremarkable. The central airways are clear. No mediastinal, hilar or axillary lymphadenopathy. Diffusely dilated esophagus filled with air and fluid. This is similar to the prior studies and likely represents chronic dysmotility such as achalasia. LUNGS AND PLEURA: 4 mm right lower lung nodule, series 4 image 159. No change since prior study. No imaging follow-up is indicated by size criteria. No focal consolidation or pulmonary edema. No pleural effusion. No pneumothorax. ABDOMEN AND PELVIS: LIVER: Unremarkable. GALLBLADDER AND BILE DUCTS: Increased density throughout the gallbladder likely representing vicarious contrast excretion. No bile duct dilatation. SPLEEN: No acute abnormality. PANCREAS: No acute abnormality. ADRENAL GLANDS: No acute abnormality. KIDNEYS, URETERS AND BLADDER: Right lower pole renal stone measuring 3 mm in diameter. No hydronephrosis or hydroureter. No perinephric or periureteral stranding. Residual contrast material in the bladder. No bladder wall thickening. GI AND BOWEL: The stomach is partially decompressed, but there is evidence of gastric wall thickening in the distal stomach, similar to prior study. Residual contrast material demonstrated in the colon. The small bowel and colon are decompressed. The appendix is normal. There is no bowel obstruction.  REPRODUCTIVE ORGANS: The uterus appears surgically absent. No abnormal adnexal masses. PERITONEUM AND RETROPERITONEUM: No free air or free fluid in the abdomen or pelvis to suggest perforation. VASCULATURE: Calcification of the aorta. Aorta is normal in caliber. ABDOMINAL AND PELVIS LYMPH NODES: No lymphadenopathy. BONES AND SOFT TISSUES: Mild degenerative changes in the spine. Slight anterior subluxation of L4 on L5, likely degenerative. No change since prior study. No acute osseous abnormality. No focal soft tissue abnormality. IMPRESSION: 1. No acute findings. No evidence of bowel perforation . 2. Gastric wall thickening in the distal stomach, similar to prior study. 3. Esophageal dilatation suggests  chronic esophageal dysmotility such as achalasia, similar to prior studies. 4. Nonobstructing 3 mm right lower pole renal stone. No hydronephrosis or hydroureter. Electronically signed by: Elsie Gravely MD 08/13/2024 11:55 PM EST RP Workstation: [id number]   DG Chest Port 1 View Result Date: 08/13/2024 EXAM: 1 VIEW(S) XRAY OF THE CHEST 08/13/2024 09:03:00 PM COMPARISON: CT chest 08/10/2023. CLINICAL HISTORY: Abdominal pain. FINDINGS: LUNGS AND PLEURA: No focal pulmonary opacity. No vascular congestion, edema, or consolidation. No pleural effusion. No pneumothorax. HEART AND MEDIASTINUM: Mild cardiac enlargement. Mediastinal contours appear intact. Calcification of the aorta. BONES AND SOFT TISSUES: No acute osseous abnormality. IMPRESSION: 1. No acute cardiopulmonary findings. Electronically signed by: Elsie Gravely MD 08/13/2024 09:06 PM EST RP Workstation: [id number]   US  Abdomen Limited RUQ (LIVER/GB) Result Date: 08/12/2024 CLINICAL DATA:  Right upper quadrant abdominal pain EXAM: ULTRASOUND ABDOMEN LIMITED RIGHT UPPER QUADRANT COMPARISON:  June 12, 2016 FINDINGS: Gallbladder: No gallstones or wall thickening visualized. No sonographic Murphy sign noted by sonographer. Common bile duct: Diameter: 2 mm  which is within normal limits. Liver: No focal lesion identified. Within normal limits in parenchymal echogenicity. Portal vein is patent on color Doppler imaging with normal direction of blood flow towards the liver. Other: None. IMPRESSION: No definite abnormality seen in the right upper quadrant of the abdomen. Electronically Signed   By: Lynwood Landy Raddle M.D.   On: 08/12/2024 10:27   CT ABDOMEN PELVIS W CONTRAST Result Date: 08/12/2024 CLINICAL DATA:  Acute generalized abdominal pain EXAM: CT ABDOMEN AND PELVIS WITH CONTRAST TECHNIQUE: Multidetector CT imaging of the abdomen and pelvis was performed using the standard protocol following bolus administration of intravenous contrast. RADIATION DOSE REDUCTION: This exam was performed according to the departmental dose-optimization program which includes automated exposure control, adjustment of the mA and/or kV according to patient size and/or use of iterative reconstruction technique. CONTRAST:  OMNIPAQUE  IOHEXOL  300 MG/ML  SOLN COMPARISON:  February 15, 2018 FINDINGS: Lower chest: Visualized lung bases are unremarkable. Moderately dilated esophagus is noted with moderate wall thickening suggesting esophagitis. Hepatobiliary: No focal liver abnormality is seen. No gallstones, gallbladder wall thickening, or biliary dilatation. Pancreas: Unremarkable. No pancreatic ductal dilatation or surrounding inflammatory changes. Spleen: Normal in size without focal abnormality. Adrenals/Urinary Tract: Adrenal glands are unremarkable. Kidneys are normal, without renal calculi, focal lesion, or hydronephrosis. Bladder is unremarkable. Stomach/Bowel: Moderate wall thickening of distal stomach is noted concerning for gastritis or peptic ulcer disease. Possible ulceration is noted along lesser curvature. Endoscopy is recommended. There is no evidence of bowel obstruction. Appendix is unremarkable. Vascular/Lymphatic: Aortic atherosclerosis. No enlarged abdominal or pelvic lymph  nodes. Reproductive: Status post hysterectomy. No adnexal masses. Other: No abdominal wall hernia or abnormality. No abdominopelvic ascites. Musculoskeletal: No acute or significant osseous findings. IMPRESSION: 1. Moderate wall thickening of distal stomach is noted concerning for gastritis or peptic ulcer disease. Possible ulceration is noted along lesser curvature. Endoscopy is recommended. 2. Moderately dilated esophagus is noted with moderate wall thickening suggesting esophagitis. 3. Aortic atherosclerosis. Aortic Atherosclerosis (ICD10-I70.0). Electronically Signed   By: Lynwood Landy Raddle M.D.   On: 08/12/2024 10:02    Labs: BNP (last 3 results) No results for input(s): BNP in the last 8760 hours. Basic Metabolic Panel: Recent Labs  Lab 08/16/24 0411 08/17/24 0357  NA 142 142  K 5.0 4.6  CL 109 108  CO2 21* 23  GLUCOSE 99 118*  BUN 27* 18  CREATININE 1.00 1.00  CALCIUM  10.0 9.7  MG  --  2.3  Liver Function Tests: No results for input(s): AST, ALT, ALKPHOS, BILITOT, PROT, ALBUMIN in the last 168 hours. No results for input(s): LIPASE, AMYLASE in the last 168 hours. No results for input(s): AMMONIA in the last 168 hours. CBC: Recent Labs  Lab 08/16/24 0411 08/17/24 0357  WBC 8.1 9.6  NEUTROABS  --  7.9*  HGB 12.1 10.8*  HCT 36.8 33.4*  MCV 90.0 89.8  PLT 131* 141*   CBG: No results for input(s): GLUCAP in the last 168 hours. No results for input(s): CHOL, HDL, LDLCALC, TRIG, CHOLHDL, LDLDIRECT in the last 72 hours. Thyroid function studies No results for input(s): TSH, T4TOTAL, T3FREE, THYROIDAB in the last 72 hours.  Invalid input(s): FREET3 Urinalysis    Component Value Date/Time   COLORURINE YELLOW 08/14/2024 1843   APPEARANCEUR HAZY (A) 08/14/2024 1843   LABSPEC 1.024 08/14/2024 1843   PHURINE 5.0 08/14/2024 1843   GLUCOSEU NEGATIVE 08/14/2024 1843   GLUCOSEU NEG mg/dL 95/71/7988 7984   HGBUR SMALL (A) 08/14/2024  1843   BILIRUBINUR NEGATIVE 08/14/2024 1843   KETONESUR NEGATIVE 08/14/2024 1843   PROTEINUR 100 (A) 08/14/2024 1843   UROBILINOGEN 0.2 09/25/2014 1033   NITRITE NEGATIVE 08/14/2024 1843   LEUKOCYTESUR NEGATIVE 08/14/2024 1843   Sepsis Labs Recent Labs  Lab 08/16/24 0411 08/17/24 0357  WBC 8.1 9.6   Microbiology Recent Results (from the past 240 hours)  Surgical PCR screen     Status: None   Collection Time: 08/13/24  3:31 AM   Specimen: Nasal Mucosa; Nasal Swab  Result Value Ref Range Status   MRSA, PCR NEGATIVE NEGATIVE Final   Staphylococcus aureus NEGATIVE NEGATIVE Final    Comment: (NOTE) The Xpert SA Assay (FDA approved for NASAL specimens in patients 77 years of age and older), is one component of a comprehensive surveillance program. It is not intended to diagnose infection nor to guide or monitor treatment. Performed at [facility], [address], [place], [place] [address]    Time coordinating discharge: 25 minutes SIGNED: Mennie LAMY, MD  [facility] 08/22/2024, 12:49 PM  If 7PM-7AM, please contact night-coverage [email or web address]       [1] No Known Allergies

## 2024-08-23 ENCOUNTER — Telehealth: Payer: Self-pay

## 2024-08-23 NOTE — Transitions of Care (Post Inpatient/ED Visit) (Signed)
 "  08/23/2024  Name: Jennifer [name] MRN: [id number] DOB: 12-06-1960  Today's TOC FU Call Status: Today's TOC FU Call Status:: Successful TOC FU Call Completed TOC FU Call Complete Date: 08/23/24  Patient's Name and Date of Birth confirmed. DOB, Name  Transition Care Management Follow-up Telephone Call Date of Discharge: 08/22/24 Discharge Facility: [facility]) Type of Discharge: Inpatient Admission Primary Inpatient Discharge Diagnosis:: AKI How have you been since you were released from the hospital?: Better (She said she is still in pain, but better. She noted that she can't walk much and does not have her appetite back.) Any questions or concerns?: Yes Patient Questions/Concerns:: THe patient sounded frustrated with this call. She said she is tired of answering the same questions over and over.  She said she can't move around well and really can't do anything. It was difficult to understand her at times. She asked if I was calling to make an appointment for her and I explained that was part of my call, I was calling to see how she is doing since she returned home. After discussing some of the questions that are part of this TOC call, she sounded irritated and hung up.  Items Reviewed: Did you receive and understand the discharge instructions provided?: Yes (She has them but said she has not reviewed them yet.  She noted that she had other issues to address when she returned to her home and did not have time to sit and read them) Medications obtained,verified, and reconciled?: No Medications Not Reviewed Reasons:: Other: (She stated she has her medications and she refused to review the med list. I explained to her that there are 2 medications she is to pause taking right now and she was not aware of those instructions. I offered again to review the list and she refused.) Any new allergies since your discharge?: No Dietary orders reviewed?: No (She hung up before this could be  addressed) Do you have support at home?: No (She said she lives alone.)  Medications Reviewed Today: Medications Reviewed Today   Medications were not reviewed in this encounter     Home Care and Equipment/Supplies: Were Home Health Services Ordered?: No Any new equipment or medical supplies ordered?: Yes Name of Medical supply agency?: [employer]: [facility] Were you able to get the equipment/medical supplies?: Yes Do you have any questions related to the use of the equipment/supplies?: No  Functional Questionnaire: Do you need assistance with bathing/showering or dressing?: Yes (She said she needs assistance but did not say who helps her and she hung up before we could discuss resources available. She may qualify for PCS) Do you need assistance with meal preparation?: Yes (She said she needs assistance but did not say who helps her and she hung up before we could discuss resources available.) Do you need assistance with eating?: No Do you have difficulty maintaining continence:  (not discussed) Do you need assistance with getting out of bed/getting out of a chair/moving?: Yes (ammbulates with RW.  was referred to outpatient PT) Do you have difficulty managing or taking your medications?: No (She did not report any difficulty but did not want to discuss her med regime.)  Follow up appointments reviewed: PCP Follow-up appointment confirmed?: No MD Provider Line [phone] Given: No (She hung up before this could be scheduled.) Specialist Hospital Follow-up appointment confirmed?: No Reason Specialist Follow-Up Not Confirmed: Patient has Specialist Provider Number and will Call for Appointment (She has the number for GI on her AVS  and the instructions are to call [facility] for an appointment) Do you need transportation to your follow-up appointment?:  (she hung up before this could be discussed.) Do you understand care options if your condition(s) worsen?:  (She hung up before this could be  discussed)    SIGNATURE Slater Diesel, RN   "

## 2024-08-27 ENCOUNTER — Inpatient Hospital Stay (HOSPITAL_COMMUNITY)
Admission: EM | Admit: 2024-08-27 | Source: Home / Self Care | Attending: Internal Medicine | Admitting: Internal Medicine

## 2024-08-27 ENCOUNTER — Emergency Department (HOSPITAL_COMMUNITY)

## 2024-08-27 ENCOUNTER — Other Ambulatory Visit: Payer: Self-pay

## 2024-08-27 ENCOUNTER — Encounter (HOSPITAL_COMMUNITY): Payer: Self-pay

## 2024-08-27 DIAGNOSIS — I10 Essential (primary) hypertension: Secondary | ICD-10-CM | POA: Diagnosis present

## 2024-08-27 DIAGNOSIS — F1910 Other psychoactive substance abuse, uncomplicated: Secondary | ICD-10-CM | POA: Diagnosis not present

## 2024-08-27 DIAGNOSIS — L988 Other specified disorders of the skin and subcutaneous tissue: Secondary | ICD-10-CM

## 2024-08-27 DIAGNOSIS — E876 Hypokalemia: Secondary | ICD-10-CM | POA: Insufficient documentation

## 2024-08-27 DIAGNOSIS — J90 Pleural effusion, not elsewhere classified: Secondary | ICD-10-CM | POA: Diagnosis not present

## 2024-08-27 DIAGNOSIS — K921 Melena: Principal | ICD-10-CM

## 2024-08-27 DIAGNOSIS — F3111 Bipolar disorder, current episode manic without psychotic features, mild: Secondary | ICD-10-CM | POA: Diagnosis present

## 2024-08-27 DIAGNOSIS — K254 Chronic or unspecified gastric ulcer with hemorrhage: Secondary | ICD-10-CM

## 2024-08-27 DIAGNOSIS — K316 Fistula of stomach and duodenum: Secondary | ICD-10-CM

## 2024-08-27 DIAGNOSIS — Z87898 Personal history of other specified conditions: Secondary | ICD-10-CM | POA: Diagnosis not present

## 2024-08-27 DIAGNOSIS — K922 Gastrointestinal hemorrhage, unspecified: Secondary | ICD-10-CM | POA: Diagnosis present

## 2024-08-27 DIAGNOSIS — D500 Iron deficiency anemia secondary to blood loss (chronic): Secondary | ICD-10-CM

## 2024-08-27 DIAGNOSIS — B3781 Candidal esophagitis: Secondary | ICD-10-CM | POA: Diagnosis present

## 2024-08-27 DIAGNOSIS — K255 Chronic or unspecified gastric ulcer with perforation: Secondary | ICD-10-CM

## 2024-08-27 DIAGNOSIS — Z8719 Personal history of other diseases of the digestive system: Secondary | ICD-10-CM

## 2024-08-27 DIAGNOSIS — K750 Abscess of liver: Secondary | ICD-10-CM | POA: Diagnosis present

## 2024-08-27 DIAGNOSIS — E782 Mixed hyperlipidemia: Secondary | ICD-10-CM

## 2024-08-27 DIAGNOSIS — B3782 Candidal enteritis: Secondary | ICD-10-CM | POA: Insufficient documentation

## 2024-08-27 DIAGNOSIS — J189 Pneumonia, unspecified organism: Secondary | ICD-10-CM | POA: Diagnosis not present

## 2024-08-27 LAB — COMPREHENSIVE METABOLIC PANEL WITH GFR
ALT: 46 U/L — ABNORMAL HIGH (ref 0–44)
AST: 31 U/L (ref 15–41)
Albumin: 2.4 g/dL — ABNORMAL LOW (ref 3.5–5.0)
Alkaline Phosphatase: 128 U/L — ABNORMAL HIGH (ref 38–126)
Anion gap: 13 (ref 5–15)
BUN: 22 mg/dL (ref 8–23)
CO2: 22 mmol/L (ref 22–32)
Calcium: 8.9 mg/dL (ref 8.9–10.3)
Chloride: 105 mmol/L (ref 98–111)
Creatinine, Ser: 0.9 mg/dL (ref 0.44–1.00)
GFR, Estimated: >60 mL/min
Glucose, Bld: 83 mg/dL (ref 70–99)
Potassium: 3.1 mmol/L — ABNORMAL LOW (ref 3.5–5.1)
Sodium: 139 mmol/L (ref 135–145)
Total Bilirubin: 0.3 mg/dL (ref 0.0–1.2)
Total Protein: 6.7 g/dL (ref 6.5–8.1)

## 2024-08-27 LAB — CBC WITH DIFFERENTIAL/PLATELET
Basophils Absolute: 0 K/uL (ref 0.0–0.1)
Basophils Relative: 0 %
Eosinophils Absolute: 0 K/uL (ref 0.0–0.5)
Eosinophils Relative: 0 %
HCT: 27.2 % — ABNORMAL LOW (ref 36.0–46.0)
Hemoglobin: 8.9 g/dL — ABNORMAL LOW (ref 12.0–15.0)
Lymphocytes Relative: 23 %
Lymphs Abs: 1.8 K/uL (ref 0.7–4.0)
MCH: 29.4 pg (ref 26.0–34.0)
MCHC: 32.7 g/dL (ref 30.0–36.0)
MCV: 89.8 fL (ref 80.0–100.0)
Monocytes Absolute: 0.5 K/uL (ref 0.1–1.0)
Monocytes Relative: 7 %
Neutro Abs: 5.5 K/uL (ref 1.7–7.7)
Neutrophils Relative %: 70 %
Platelets: 528 K/uL — ABNORMAL HIGH (ref 150–400)
RBC: 3.03 MIL/uL — ABNORMAL LOW (ref 3.87–5.11)
RDW: 14.8 % (ref 11.5–15.5)
WBC: 7.8 K/uL (ref 4.0–10.5)
nRBC: 0.4 % — ABNORMAL HIGH (ref 0.0–0.2)

## 2024-08-27 LAB — LIPASE, BLOOD: Lipase: 49 U/L (ref 11–51)

## 2024-08-27 LAB — URINALYSIS, W/ REFLEX TO CULTURE (INFECTION SUSPECTED)
Bilirubin Urine: NEGATIVE
Glucose, UA: NEGATIVE mg/dL
Ketones, ur: NEGATIVE mg/dL
Leukocytes,Ua: NEGATIVE
Nitrite: NEGATIVE
Protein, ur: NEGATIVE mg/dL
RBC / HPF: >50 RBC/hpf (ref 0–5)
Specific Gravity, Urine: 1.048 — ABNORMAL HIGH (ref 1.005–1.030)
pH: 5 (ref 5.0–8.0)

## 2024-08-27 LAB — MAGNESIUM: Magnesium: 2.1 mg/dL (ref 1.7–2.4)

## 2024-08-27 MED ORDER — ATORVASTATIN CALCIUM 10 MG PO TABS
10.0000 mg | ORAL_TABLET | Freq: Every day | ORAL | Status: DC
  Filled 2024-08-27: qty 1

## 2024-08-27 MED ORDER — PANTOPRAZOLE SODIUM 40 MG IV SOLR
80.0000 mg | Freq: Once | INTRAVENOUS | Status: AC
  Administered 2024-08-27: 80 mg via INTRAVENOUS
  Filled 2024-08-27: qty 20

## 2024-08-27 MED ORDER — PIPERACILLIN-TAZOBACTAM 3.375 G IVPB 30 MIN
3.3750 g | Freq: Once | INTRAVENOUS | Status: AC
  Administered 2024-08-27: 3.375 g via INTRAVENOUS
  Filled 2024-08-27: qty 50

## 2024-08-27 MED ORDER — FLUCONAZOLE 200 MG PO TABS
400.0000 mg | ORAL_TABLET | Freq: Every day | ORAL | Status: AC
  Administered 2024-08-28: 400 mg via ORAL
  Filled 2024-08-27: qty 2

## 2024-08-27 MED ORDER — ACETAMINOPHEN 650 MG RE SUPP
650.0000 mg | Freq: Four times a day (QID) | RECTAL | Status: DC | PRN
  Administered 2024-08-29 – 2024-09-09 (×4): 650 mg via RECTAL
  Filled 2024-08-27 (×5): qty 1

## 2024-08-27 MED ORDER — SODIUM CHLORIDE 0.9 % IV SOLN: 250.0000 mL | INTRAVENOUS | Status: AC | PRN

## 2024-08-27 MED ORDER — ONDANSETRON HCL 4 MG PO TABS: 4.0000 mg | ORAL_TABLET | Freq: Four times a day (QID) | ORAL | Status: DC | PRN

## 2024-08-27 MED ORDER — SODIUM CHLORIDE 0.9% FLUSH
3.0000 mL | Freq: Two times a day (BID) | INTRAVENOUS | Status: DC
  Administered 2024-08-28 – 2024-09-02 (×8): 3 mL via INTRAVENOUS

## 2024-08-27 MED ORDER — FOLIC ACID 1 MG PO TABS
1.0000 mg | ORAL_TABLET | Freq: Every day | ORAL | Status: DC
  Administered 2024-08-28: 1 mg via ORAL
  Filled 2024-08-27: qty 1

## 2024-08-27 MED ORDER — HYDROMORPHONE HCL 1 MG/ML IJ SOLN
0.5000 mg | Freq: Once | INTRAMUSCULAR | Status: AC
  Administered 2024-08-27: 0.5 mg via INTRAVENOUS
  Filled 2024-08-27: qty 1

## 2024-08-27 MED ORDER — SODIUM CHLORIDE 0.9 % IV BOLUS
1000.0000 mL | Freq: Once | INTRAVENOUS | Status: AC
  Administered 2024-08-27: 1000 mL via INTRAVENOUS

## 2024-08-27 MED ORDER — ACETAMINOPHEN 325 MG PO TABS
650.0000 mg | ORAL_TABLET | Freq: Four times a day (QID) | ORAL | Status: DC | PRN
  Administered 2024-08-28 – 2024-09-07 (×8): 650 mg via ORAL
  Filled 2024-08-27 (×9): qty 2

## 2024-08-27 MED ORDER — VANCOMYCIN HCL 750 MG/150ML IV SOLN
750.0000 mg | Freq: Two times a day (BID) | INTRAVENOUS | Status: DC
  Administered 2024-08-28 (×2): 750 mg via INTRAVENOUS
  Filled 2024-08-27 (×3): qty 150

## 2024-08-27 MED ORDER — VANCOMYCIN HCL 1750 MG/350ML IV SOLN
1750.0000 mg | Freq: Once | INTRAVENOUS | Status: AC
  Administered 2024-08-28: 1750 mg via INTRAVENOUS
  Filled 2024-08-27: qty 350

## 2024-08-27 MED ORDER — LACTATED RINGERS IV SOLN: INTRAVENOUS | Status: AC

## 2024-08-27 MED ORDER — ONDANSETRON HCL 4 MG/2ML IJ SOLN: 4.0000 mg | Freq: Four times a day (QID) | INTRAMUSCULAR | Status: DC | PRN

## 2024-08-27 MED ORDER — PANTOPRAZOLE SODIUM 40 MG IV SOLR
40.0000 mg | Freq: Two times a day (BID) | INTRAVENOUS | Status: AC
  Administered 2024-08-28 – 2024-09-16 (×39): 40 mg via INTRAVENOUS
  Filled 2024-08-27 (×40): qty 10

## 2024-08-27 MED ORDER — SODIUM CHLORIDE 0.9% FLUSH
3.0000 mL | INTRAVENOUS | Status: DC | PRN
  Administered 2024-08-31: 3 mL via INTRAVENOUS

## 2024-08-27 MED ORDER — ONDANSETRON HCL 4 MG/2ML IJ SOLN
4.0000 mg | Freq: Once | INTRAMUSCULAR | Status: DC
  Filled 2024-08-27: qty 2

## 2024-08-27 MED ORDER — PIPERACILLIN-TAZOBACTAM 3.375 G IVPB
3.3750 g | Freq: Three times a day (TID) | INTRAVENOUS | Status: DC
  Administered 2024-08-28 – 2024-08-30 (×7): 3.375 g via INTRAVENOUS
  Filled 2024-08-27 (×7): qty 50

## 2024-08-27 MED ORDER — POTASSIUM CHLORIDE 10 MEQ/100ML IV SOLN
10.0000 meq | INTRAVENOUS | Status: AC
  Administered 2024-08-28 (×3): 10 meq via INTRAVENOUS
  Filled 2024-08-27 (×3): qty 100

## 2024-08-27 MED ORDER — PANTOPRAZOLE SODIUM 40 MG IV SOLR: 40.0000 mg | INTRAVENOUS | Status: DC

## 2024-08-27 MED ORDER — IOHEXOL 350 MG/ML SOLN
75.0000 mL | Freq: Once | INTRAVENOUS | Status: AC | PRN
  Administered 2024-08-27: 75 mL via INTRAVENOUS

## 2024-08-27 MED ORDER — DICYCLOMINE HCL 20 MG PO TABS
20.0000 mg | ORAL_TABLET | Freq: Three times a day (TID) | ORAL | Status: DC | PRN
  Administered 2024-08-28 (×2): 20 mg via ORAL
  Filled 2024-08-27 (×2): qty 1

## 2024-08-27 MED ORDER — SODIUM CHLORIDE 0.9% FLUSH
3.0000 mL | Freq: Two times a day (BID) | INTRAVENOUS | Status: DC
  Administered 2024-08-28 – 2024-09-02 (×7): 3 mL via INTRAVENOUS

## 2024-08-27 MED ORDER — PANTOPRAZOLE SODIUM 40 MG IV SOLR: 40.0000 mg | Freq: Two times a day (BID) | INTRAVENOUS | Status: DC

## 2024-08-27 MED ORDER — ONDANSETRON HCL 4 MG/2ML IJ SOLN
4.0000 mg | Freq: Once | INTRAMUSCULAR | Status: AC
  Administered 2024-08-27: 4 mg via INTRAVENOUS
  Filled 2024-08-27: qty 2

## 2024-08-27 MED ORDER — TRIMETHOBENZAMIDE HCL 100 MG/ML IM SOLN
200.0000 mg | Freq: Four times a day (QID) | INTRAMUSCULAR | Status: DC | PRN
  Filled 2024-08-27: qty 2

## 2024-08-27 MED ORDER — MORPHINE SULFATE (PF) 4 MG/ML IV SOLN
4.0000 mg | Freq: Once | INTRAVENOUS | Status: AC
  Administered 2024-08-27: 4 mg via INTRAVENOUS
  Filled 2024-08-27: qty 1

## 2024-08-27 NOTE — ED Notes (Addendum)
 Pt transported to CT ?

## 2024-08-27 NOTE — ED Triage Notes (Addendum)
 Pt presents to ED via EMS from home with black tarry stools that began this morning. Pt also reports vomiting and feeling weak. BP 80 systolic initially. 116/60 upon ED arrival.  20 RAC 250 cc NS  116/60 HR 80  98% RA

## 2024-08-27 NOTE — H&P (Incomplete)
 " History and Physical    Jennifer [name] FMW:[id number] DOB: Sep 29, 1960 DOA: 08/27/2024  PCP: Delbert Clam, MD   Patient coming from: Home   Chief Complaint:  Chief Complaint  Patient presents with   Melena   Weakness   ED TRIAGE note:Pt presents to ED via EMS from home with black tarry stools that began this morning. Pt also reports vomiting and feeling weak. BP 80 systolic initially. 116/60 upon ED arrival.   20 RAC 250 cc NS   116/60 HR 80  98% RA  HPI:  Jennifer [name] is a 64 y.o. female with medical history significant of history of GI bleed (recent admission 1/3 for melena-EGD showed esophageal plaques suspicious for candidiasis nonbleeding gastric ulcer and gastritis-patient left AMA before completion of the treatment), essential hypertension, polysubstance abuse disorder, tobacco use disorder, thrombocytopenia, essential hypertension, physical deconditioning/debility and weakness, and morbid obesity presented to emergency department complaining of black-colored stool that started today morning and patient also has generalized weakness.   During previous hospital admission patient being evaluated by ER GI.  During my evaluation at bedside patient is complaining about right sided midepigastric abdominal pain on palpation.  Denies nausea vomiting presently.  Patient also complaining about nonproductive cough. Patient denies fever, chill, fatigue, myalgia,  back pain and joint pain.  Discussed with patient and she is agreeable and giving verbal consent for blood transfusions.  Also patient is agreeable to stay in the hospital for further treatment.  ED Course: At presentation to ED patient found hypotensive systolic in 80s after all blood pressure improved to upper 100 range.  Lab, CBC showing low hemoglobin 8.9 (baseline around 10-12), normal WBC and normal platelet count. CMP shows low potassium 3.1, elevated ALT 46, ALP 128 and normal bilirubin level.  Normal lipase  level.  Normal mag.  UA rules out UTI. - CT abdomen pelvis showing: 1. Large right hepatic subcapsular abscess with fistulous communication to the gastric antrum. 2. Gastric antral wall thickening with mild surrounding inflammation, similar to prior. 3. New moderate sized right pleural effusion with compressive atelectasis of the right lower lobe and new right middle lobe airspace consolidation  In the ED patient received 2 L of NS bolus, IV potassium, IV Protonix  and Zosyn .  EDP consulted [facility] Dr. Kriss will see patient in the AM. After EDP discussed case with general surgery who recommended IR consult for drainage of the hepatic abscess.  Hospitalist consulted for further evaluation management of hepatic abscess, right pleural effusion with consolidation/pneumonia and GI bleed.    Significant labs in the ED: Lab Orders         Culture, blood (Routine X 2) w Reflex to ID Panel         Expectorated Sputum Assessment w Gram Stain, Rflx to Resp Cult         Body fluid culture w Gram Stain         Expectorated Sputum Assessment w Gram Stain, Rflx to Resp Cult         CBC with Differential         Comprehensive metabolic panel         Lipase, blood         Urinalysis, w/ Reflex to Culture (Infection Suspected) -Urine, Clean Catch         Magnesium          Ethanol         Body fluid cell count with differential  Comprehensive metabolic panel         CBC         Procalcitonin         Urine Drug Screen         Legionella Pneumophila Serogp 1 Ur Ag         Strep pneumoniae urinary antigen         POC occult blood, ED       Review of Systems:  Review of Systems  Constitutional:  Negative for chills, fever and weight loss.  Respiratory:  Positive for cough. Negative for sputum production and shortness of breath.   Cardiovascular:  Negative for chest pain and palpitations.  Gastrointestinal:  Positive for abdominal pain, melena, nausea and vomiting. Negative for blood  in stool.  Genitourinary:  Negative for dysuria and urgency.  Musculoskeletal:  Negative for back pain, joint pain and myalgias.  Neurological:  Negative for dizziness and headaches.  Psychiatric/Behavioral:  The patient is not nervous/anxious.     Past Medical History:  Diagnosis Date   bipolar    Bipolar 1 disorder (HCC)    Depression    Fibroids    now s/p hysterectomy   History of alcohol use 08/27/2024   Hypertension    Left ventricular hypertrophy    Polysubstance abuse (HCC)    IV drug us , cocaine on occassion, smoking and alcoholism, has been going to AA, not completely sober    Past Surgical History:  Procedure Laterality Date   ABDOMINAL HYSTERECTOMY     2003   BILATERAL SALPINGOOPHORECTOMY     01/2010   BONE BIOPSY  08/13/2024   Procedure: BIOPSY, GI;  Surgeon: Dianna Specking, MD;  Location: [facility];  Service: Gastroenterology;;   DILATION AND CURETTAGE OF UTERUS     ESOPHAGOGASTRODUODENOSCOPY N/A 08/13/2024   Procedure: EGD (ESOPHAGOGASTRODUODENOSCOPY);  Surgeon: Dianna Specking, MD;  Location: [facility];  Service: Gastroenterology;  Laterality: N/A;   EXPLORATORY LAPAROTOMY     complex pelvic mass 2011   TONSILLECTOMY       reports that she has been smoking cigarettes. She has a 20 pack-year smoking history. She has never used smokeless tobacco. She reports current alcohol use. She reports that she does not currently use drugs after having used the following drugs: Cocaine and Marijuana.  Allergies[1]  Family History  Problem Relation Age of Onset   Hypertension Mother    Hypertension Sister    Diabetes Father     Prior to Admission medications  Medication Sig Start Date End Date Taking? Authorizing Provider  [Paused] amLODipine  (NORVASC ) 10 MG tablet TAKE 1 TABLET (10 MG TOTAL) BY MOUTH DAILY (Office visit for future refills) Wait to take this until your doctor or other care provider tells you to start again. 07/14/24   Newlin, Enobong, MD   atorvastatin  (LIPITOR) 10 MG tablet Take 1 tablet (10 mg total) by mouth daily. 07/14/24   Newlin, Enobong, MD  Blood Pressure Monitoring (BLOOD PRESSURE CUFF) MISC 1 Units by Does not apply route daily. 05/24/24   Mayers, Cari S, PA-C  dicyclomine  (BENTYL ) 20 MG tablet Take 1 tablet (20 mg total) by mouth 3 (three) times daily as needed for spasms. 08/22/24 09/21/24  Christobal Guadalajara, MD  fluconazole  (DIFLUCAN ) 200 MG tablet Take 2 tablets (400 mg total) by mouth daily for 6 days. 08/22/24 08/28/24  Christobal Guadalajara, MD  folic acid  (FOLVITE ) 1 MG tablet Take 1 tablet (1 mg total) by mouth daily. 08/22/24 09/21/24  Christobal Guadalajara, MD  [  Paused] lisinopril -hydrochlorothiazide  (ZESTORETIC ) 20-12.5 MG tablet Take 2 tablets by mouth daily. Wait to take this until your doctor or other care provider tells you to start again. 07/14/24   Newlin, Enobong, MD  metoprolol  succinate (TOPROL -XL) 50 MG 24 hr tablet Take 1 tablet (50 mg total) by mouth daily. 07/14/24   Newlin, Enobong, MD  oxyCODONE -acetaminophen  (PERCOCET/ROXICET) 5-325 MG tablet Take 1 tablet by mouth every 6 (six) hours as needed for moderate pain (pain score 4-6). 08/22/24   Christobal Guadalajara, MD  pantoprazole  (PROTONIX ) 40 MG tablet Take 1 tablet (40 mg total) by mouth 2 (two) times daily. 08/22/24 09/21/24  Christobal Guadalajara, MD  thiamine  (VITAMIN B-1) 100 MG tablet Take 1 tablet (100 mg total) by mouth daily. 08/22/24 09/21/24  Christobal Guadalajara, MD     Physical Exam: Vitals:   08/27/24 1637 08/27/24 1639 08/27/24 2210  BP:  111/69 (!) 101/49  Pulse:  78 60  Resp:  20 18  Temp:  98.1 F (36.7 C) 98.6 F (37 C)  TempSrc:  Oral Oral  SpO2:  96% 93%  Weight: 81.6 kg    Height: 5' 4 (1.626 m)      Physical Exam Constitutional:      Appearance: She is ill-appearing.  HENT:     Mouth/Throat:     Mouth: Mucous membranes are dry.  Eyes:     Pupils: Pupils are equal, round, and reactive to light.  Cardiovascular:     Rate and Rhythm: Normal rate and regular rhythm.      Pulses: Normal pulses.     Heart sounds: Normal heart sounds.  Pulmonary:     Effort: Pulmonary effort is normal.     Breath sounds: Normal breath sounds.  Abdominal:     General: There is no distension.     Palpations: Abdomen is soft.     Tenderness: There is abdominal tenderness. There is no guarding or rebound.  Musculoskeletal:     Cervical back: Neck supple.     Right lower leg: No edema.     Left lower leg: No edema.  Skin:    General: Skin is dry.     Capillary Refill: Capillary refill takes less than 2 seconds.     Coloration: Skin is pale.     Findings: No lesion or rash.  Neurological:     Mental Status: She is alert and oriented to person, place, and time.  Psychiatric:        Mood and Affect: Mood normal.      Labs on Admission: I have personally reviewed following labs and imaging studies  CBC: Recent Labs  Lab 08/27/24 1821  WBC 7.8  NEUTROABS 5.5  HGB 8.9*  HCT 27.2*  MCV 89.8  PLT 528*   Basic Metabolic Panel: Recent Labs  Lab 08/27/24 1821  NA 139  K 3.1*  CL 105  CO2 22  GLUCOSE 83  BUN 22  CREATININE 0.90  CALCIUM  8.9  MG 2.1   GFR: Estimated Creatinine Clearance: 67.9 mL/min (by C-G formula based on SCr of 0.9 mg/dL). Liver Function Tests: Recent Labs  Lab 08/27/24 1821  AST 31  ALT 46*  ALKPHOS 128*  BILITOT 0.3  PROT 6.7  ALBUMIN 2.4*   Recent Labs  Lab 08/27/24 1821  LIPASE 49   No results for input(s): AMMONIA in the last 168 hours. Coagulation Profile: No results for input(s): INR, PROTIME in the last 168 hours. Cardiac Enzymes: No results for input(s): CKTOTAL, CKMB, CKMBINDEX, TROPONINI,  TROPONINIHS in the last 168 hours. BNP (last 3 results) No results for input(s): BNP in the last 8760 hours. HbA1C: No results for input(s): HGBA1C in the last 72 hours. CBG: No results for input(s): GLUCAP in the last 168 hours. Lipid Profile: No results for input(s): CHOL, HDL, LDLCALC, TRIG,  CHOLHDL, LDLDIRECT in the last 72 hours. Thyroid Function Tests: No results for input(s): TSH, T4TOTAL, FREET4, T3FREE, THYROIDAB in the last 72 hours. Anemia Panel: No results for input(s): VITAMINB12, FOLATE, FERRITIN, TIBC, IRON, RETICCTPCT in the last 72 hours. Urine analysis:    Component Value Date/Time   COLORURINE YELLOW 08/27/2024 2153   APPEARANCEUR HAZY (A) 08/27/2024 2153   LABSPEC 1.048 (H) 08/27/2024 2153   PHURINE 5.0 08/27/2024 2153   GLUCOSEU NEGATIVE 08/27/2024 2153   GLUCOSEU NEG mg/dL 95/71/7988 7984   HGBUR LARGE (A) 08/27/2024 2153   BILIRUBINUR NEGATIVE 08/27/2024 2153   KETONESUR NEGATIVE 08/27/2024 2153   PROTEINUR NEGATIVE 08/27/2024 2153   UROBILINOGEN 0.2 09/25/2014 1033   NITRITE NEGATIVE 08/27/2024 2153   LEUKOCYTESUR NEGATIVE 08/27/2024 2153    Radiological Exams on Admission: I have personally reviewed images CT ABDOMEN PELVIS W CONTRAST Result Date: 08/27/2024 EXAM: CT ABDOMEN AND PELVIS WITH CONTRAST 08/27/2024 09:02:43 PM TECHNIQUE: CT of the abdomen and pelvis was performed with the administration of intravenous contrast. Multiplanar reformatted images are provided for review. Automated exposure control, iterative reconstruction, and/or weight-based adjustment of the mA/kV was utilized to reduce the radiation dose to as low as reasonably achievable. COMPARISON: CT chest abdomen and pelvis 08/13/2024. CLINICAL HISTORY: Epigastric pain. FINDINGS: LOWER CHEST: There is a new moderate sized right pleural effusion with compressive atelectasis of the right lower lobe. There is new right middle lobe airspace consolidation with air bronchograms. LIVER: There is a new enhancing lateral right hepatic subcapsular fluid collection containing air measuring 12.3 x 3.8 x 16.1 cm. This abuts the anterior abdominal wall. GALLBLADDER AND BILE DUCTS: Gallbladder is unremarkable. No biliary ductal dilatation. SPLEEN: No acute abnormality. PANCREAS:  No acute abnormality. ADRENAL GLANDS: No acute abnormality. KIDNEYS, URETERS AND BLADDER: There is a 3 mm calculus in the inferior pole of the right kidney. No hydronephrosis. No perinephric or periureteral stranding. Urinary bladder is unremarkable. GI AND BOWEL: Air fluid tract is seen extending from the enhancing lateral right hepatic subcapsular fluid collection to the anterior superior gastric antrum best seen on coronal images 6/70-74. There is fluid distention of the distal esophagus, unchanged. There is wall thickening of the gastric antrum as seen on prior. There is mild inflammation surrounding the gastric antrum. The appendix appears normal. There is no bowel obstruction. PERITONEUM AND RETROPERITONEUM: No ascites. No free air. VASCULATURE: Aorta is normal in caliber. Atherosclerotic calcifications of the aorta and iliac arteries. LYMPH NODES: No lymphadenopathy. REPRODUCTIVE ORGANS: Uterus is not seen. BONES AND SOFT TISSUES: No acute osseous abnormality. There is scarring in the anterior abdominal wall. IMPRESSION: 1. Large right hepatic subcapsular abscess with fistulous communication to the gastric antrum. 2. Gastric antral wall thickening with mild surrounding inflammation, similar to prior. 3. New moderate sized right pleural effusion with compressive atelectasis of the right lower lobe and new right middle lobe airspace consolidation. Electronically signed by: Greig Pique MD 08/27/2024 09:25 PM EST RP Workstation: [id number]     EKG: My personal interpretation of EKG shows: Normal sinus rhythm heart 75.    Assessment/Plan: Principal Problem:   GI bleed Active Problems:   Hepatic abscess   CAP (community acquired pneumonia)  History of GI bleed   Substance abuse (HCC)   Candidiasis of esophagus (HCC)   Pleural effusion on right   History of alcohol use   Candidiasis of duodenum    Assessment and Plan: Acute GI bleed Melena Acute on chronic anemia History of upper GI  bleed -Presented to emergency department with of nausea, vomiting, abdominal pain and black tarry stool that started today morning. - Patient recently admitted 1 week ago for GI bleeding  seen by GI and underwent EGD1/10/2024-showed esophageal plaques suspicious for candidiasis, nonbleeding gastric ulcer with pigmented material, and gastritis.  Unfortunately left AMA after the EGD.  Patient was treated with IV Protonix , IV Diflucan .  GI consulted who recommended treat 2 weeks with oral Diflucan , PPI twice daily for 6 to 8 weeks and outpatient follow-up - At presentation to ED patient found hypotensive blood pressure improved with 2 L of LR bolus. -Lab, CBC showing low hemoglobin 8.9 (baseline around 10-12), normal WBC and normal platelet count. CMP shows low potassium 3.1, elevated ALT 46, ALP 128 and normal bilirubin level.  Normal lipase level.  Normal mag.  UA rules out UTI. - CT abdomen pelvis showing: 1. Large right hepatic subcapsular abscess with fistulous communication to the gastric antrum. 2. Gastric antral wall thickening with mild surrounding inflammation, similar to prior. 3. New moderate sized right pleural effusion with compressive atelectasis of the right lower lobe and new right middle lobe airspace consolidation - In the ED patient received 2 L of NS bolus, IV potassium, IV Protonix  and Zosyn . - Patient will clinically benefit from blood transfusion however she declining blood transfusion. - EDP consulted [facility] Dr. Kriss will see patient in the AM. After EDP discussed case with general surgery who recommended IR consult for drainage of the hepatic abscess. -Continue IV Protonix  40 mg twice daily. -Discussed and received verbal consent from patient floor blood transfusion.  Transfusing 1 unit of blood tonight.  Continue to monitor CBC and will transfuse as needed goal to keep hemoglobin above 8 and setting of active GI bleed. - Continue to treat for hepatic abscess, pleural  effusion, consolidation/pneumonia as mentioned below.  Candidiasis of the esophagus -Continue Diflucan   Hepatic abscess At presentation to ED patient found hypotensive blood pressure improved with 2 L of LR bolus. -Lab, CBC showing low hemoglobin 8.9 (baseline around 10-12), normal WBC and normal platelet count. CMP shows low potassium 3.1, elevated ALT 46, ALP 128 and normal bilirubin level.  Normal lipase level.  Normal mag.  UA rules out UTI. - CT abdomen pelvis showing: 1. Large right hepatic subcapsular abscess with fistulous communication to the gastric antrum. 2. Gastric antral wall thickening with mild surrounding inflammation, similar to prior. 3. New moderate sized right pleural effusion with compressive atelectasis of the right lower lobe and new right middle lobe airspace consolidation - In the ED patient received 2 L of NS bolus, IV potassium, IV Protonix  and Zosyn . -After EDP discussed case with general surgery recommended IR consult for drainage of the hepatic abscess. -Consulted IR for drainage of the hepatic abscess.  Need to send hepatic fluid for cell count, Gram stain and culture. - Continue IV Zosyn  with pharmacy consult. Follow-up with blood culture and hepatic fluid study.   Hypokalemia Low potassium 3.1 repleted with IV KCl  Community-acquired pneumonia Right-sided pleural effusion - CT abdomen pelvis showing moderate right pleural effusion and consolidation. - Consulted IR for drainage of the right sided pleural fluid and pleural fluid study. - Given  patient recently admitted starting broad-spectrum antibiotic coverage with IV vancomycin  and IV Zosyn  to cover empirically for MRSA and Pseudomonas. - Need to follow-up with blood culture, sputum culture, urine Legionella, urine strep and procalcitonin level.   History of alcohol use History of substance abuse -Checking UDS and blood alcohol level   Essential hypertension - Holding metoprolol  in the setting  of hypotension   DVT prophylaxis:  SCDs.  Deferring pharmacological DVT prophylaxis as planned for hepatic abscess drainage by IR in the daytime Code Status:  Full Code Diet: Heart healthy diet Family Communication:   Family was present at bedside, at the time of interview. Opportunity was given to ask question and all questions were answered satisfactorily.  Disposition Plan: Need to follow-up with hepatic abscess culture result, blood cultures and pleural fluid culture study Consults: Gastroenterology, general surgery and interventional radiology Admission status:   Inpatient, Step Down Unit  Severity of Illness: The appropriate patient status for this patient is INPATIENT. Inpatient status is judged to be reasonable and necessary in order to provide the required intensity of service to ensure the patient's safety. The patient's presenting symptoms, physical exam findings, and initial radiographic and laboratory data in the context of their chronic comorbidities is felt to place them at high risk for further clinical deterioration. Furthermore, it is not anticipated that the patient will be medically stable for discharge from the hospital within 2 midnights of admission.   * I certify that at the point of admission it is my clinical judgment that the patient will require inpatient hospital care spanning beyond 2 midnights from the point of admission due to high intensity of service, high risk for further deterioration and high frequency of surveillance required.DEWAINE    Dyesha Henault, MD [facility]  How to contact the [facility] Attending or Consulting provider 7A - 7P or covering provider during after hours 7P -7A, for this patient.  Check the care team in [facility] and look for a) attending/consulting [facility] provider listed and b) the [facility] team listed Log into [email or web address] and use [place]'s universal password to access. If you do not have the password, please contact the hospital  operator. Locate the [facility] provider you are looking for under [facility] and page to a number that you can be directly reached. If you still have difficulty reaching the provider, please page the [facility] (Director on Call) for the Hospitalists listed on amion for assistance.  08/28/2024, 12:19 AM           [1] No Known Allergies  "

## 2024-08-27 NOTE — ED Provider Notes (Signed)
 " [place] [facility] Provider Note   CSN: [id number] Arrival date & time: 08/27/24  1628    Patient presents with: Melena and Weakness   Jennifer [name] is a 64 y.o. female for evaluation of abdominal pain, nausea and vomiting.  She has had 2 recent admissions over the last 2 weeks for upper GI bleed.  First admission she left AMA.  Today she states she has had persistent nausea and vomiting as well as worsening mid and right abdominal pain.  This into her flank.  She states she is also having black diarrhea.  She is no longer taking any NSAIDs.  She has been taking Tylenol  at home.  No hemoptysis.  Was initially having constipation after discharge however today now having watery stool.  No fever, chest pain, shortness of breath, pain or swelling to lower legs.  No dysuria or hematuria.  Pain feels like previous pain however significantly worsened.    HPI     Prior to Admission medications  Medication Sig Start Date End Date Taking? Authorizing Provider  [Paused] amLODipine  (NORVASC ) 10 MG tablet TAKE 1 TABLET (10 MG TOTAL) BY MOUTH DAILY (Office visit for future refills) Wait to take this until your doctor or other care provider tells you to start again. 07/14/24   Newlin, Enobong, MD  atorvastatin  (LIPITOR) 10 MG tablet Take 1 tablet (10 mg total) by mouth daily. 07/14/24   Newlin, Enobong, MD  Blood Pressure Monitoring (BLOOD PRESSURE CUFF) MISC 1 Units by Does not apply route daily. 05/24/24   Mayers, Cari S, PA-C  dicyclomine  (BENTYL ) 20 MG tablet Take 1 tablet (20 mg total) by mouth 3 (three) times daily as needed for spasms. 08/22/24 09/21/24  Christobal Guadalajara, MD  fluconazole  (DIFLUCAN ) 200 MG tablet Take 2 tablets (400 mg total) by mouth daily for 6 days. 08/22/24 08/28/24  Christobal Guadalajara, MD  folic acid  (FOLVITE ) 1 MG tablet Take 1 tablet (1 mg total) by mouth daily. 08/22/24 09/21/24  Christobal Guadalajara, MD  [Paused] lisinopril -hydrochlorothiazide  (ZESTORETIC )  20-12.5 MG tablet Take 2 tablets by mouth daily. Wait to take this until your doctor or other care provider tells you to start again. 07/14/24   Newlin, Enobong, MD  metoprolol  succinate (TOPROL -XL) 50 MG 24 hr tablet Take 1 tablet (50 mg total) by mouth daily. 07/14/24   Newlin, Enobong, MD  oxyCODONE -acetaminophen  (PERCOCET/ROXICET) 5-325 MG tablet Take 1 tablet by mouth every 6 (six) hours as needed for moderate pain (pain score 4-6). 08/22/24   Christobal Guadalajara, MD  pantoprazole  (PROTONIX ) 40 MG tablet Take 1 tablet (40 mg total) by mouth 2 (two) times daily. 08/22/24 09/21/24  Christobal Guadalajara, MD  thiamine  (VITAMIN B-1) 100 MG tablet Take 1 tablet (100 mg total) by mouth daily. 08/22/24 09/21/24  Christobal Guadalajara, MD    Allergies: Patient has no known allergies.    Review of Systems  Constitutional: Negative.   HENT: Negative.    Respiratory: Negative.    Cardiovascular: Negative.   Gastrointestinal:  Positive for abdominal pain, diarrhea, nausea and vomiting. Negative for abdominal distention, anal bleeding, blood in stool, constipation and rectal pain.  Genitourinary: Negative.   Musculoskeletal: Negative.   Skin: Negative.   Neurological: Negative.   All other systems reviewed and are negative.  Updated Vital Signs BP (!) 101/49 (BP Location: Left Arm)   Pulse 60   Temp 98.6 F (37 C) (Oral)   Resp 18   Ht 5' 4 (1.626 m)  Wt 81.6 kg   LMP 12/04/2009   SpO2 93%   BMI 30.90 kg/m   Physical Exam Vitals and nursing note reviewed. Exam conducted with a chaperone present.  Constitutional:      General: She is not in acute distress.    Appearance: She is well-developed. She is not ill-appearing, toxic-appearing or diaphoretic.  HENT:     Head: Normocephalic and atraumatic.     Nose: Nose normal.     Mouth/Throat:     Mouth: Mucous membranes are moist.  Eyes:     Pupils: Pupils are equal, round, and reactive to light.  Cardiovascular:     Rate and Rhythm: Normal rate.     Pulses: Normal  pulses.          Radial pulses are 2+ on the right side and 2+ on the left side.       Dorsalis pedis pulses are 2+ on the right side and 2+ on the left side.     Heart sounds: Normal heart sounds.  Pulmonary:     Effort: Pulmonary effort is normal. No respiratory distress.     Breath sounds: Normal breath sounds.  Abdominal:     General: Bowel sounds are normal. There is no distension.     Palpations: Abdomen is soft.     Tenderness: There is abdominal tenderness. There is no right CVA tenderness, left CVA tenderness or guarding.  Genitourinary:    Comments: Declined rectal exam for occult stool Musculoskeletal:        General: No swelling, tenderness, deformity or signs of injury. Normal range of motion.     Cervical back: Normal range of motion.     Right lower leg: No edema.     Left lower leg: No edema.  Skin:    General: Skin is warm and dry.     Capillary Refill: Capillary refill takes less than 2 seconds.  Neurological:     General: No focal deficit present.     Mental Status: She is alert.     Cranial Nerves: No cranial nerve deficit.     Motor: No weakness.     Gait: Gait normal.  Psychiatric:        Mood and Affect: Mood normal.    (all labs ordered are listed, but only abnormal results are displayed) Labs Reviewed  CBC WITH DIFFERENTIAL/PLATELET - Abnormal; Notable for the following components:      Result Value   RBC 3.03 (*)    Hemoglobin 8.9 (*)    HCT 27.2 (*)    Platelets 528 (*)    nRBC 0.4 (*)    All other components within normal limits  COMPREHENSIVE METABOLIC PANEL WITH GFR - Abnormal; Notable for the following components:   Potassium 3.1 (*)    Albumin 2.4 (*)    ALT 46 (*)    Alkaline Phosphatase 128 (*)    All other components within normal limits  URINALYSIS, W/ REFLEX TO CULTURE (INFECTION SUSPECTED) - Abnormal; Notable for the following components:   APPearance HAZY (*)    Specific Gravity, Urine 1.048 (*)    Hgb urine dipstick LARGE (*)     Bacteria, UA RARE (*)    All other components within normal limits  CULTURE, BLOOD (ROUTINE X 2)  CULTURE, BLOOD (ROUTINE X 2)  EXPECTORATED SPUTUM ASSESSMENT W GRAM STAIN, RFLX TO RESP C  BODY FLUID CULTURE W GRAM STAIN  LIPASE, BLOOD  MAGNESIUM   URINE DRUG SCREEN  ETHANOL  BODY  FLUID CELL COUNT WITH DIFFERENTIAL  POC OCCULT BLOOD, ED  TYPE AND SCREEN    EKG: EKG Interpretation Date/Time:  Saturday August 27 2024 17:10:06 EST Ventricular Rate:  75 PR Interval:  152 QRS Duration:  162 QT Interval:  458 QTC Calculation: 511 R Axis:   36  Text Interpretation: Normal sinus rhythm Right bundle branch block Marked T-wave abnormality, consider inferolateral ischemia Abnormal ECG When compared with ECG of 27-Aug-2024 17:08, PREVIOUS ECG IS PRESENT similar to previous Confirmed by Bari Flank [phone]) on 08/27/2024 5:16:52 PM  Radiology: CT ABDOMEN PELVIS W CONTRAST Result Date: 08/27/2024 EXAM: CT ABDOMEN AND PELVIS WITH CONTRAST 08/27/2024 09:02:43 PM TECHNIQUE: CT of the abdomen and pelvis was performed with the administration of intravenous contrast. Multiplanar reformatted images are provided for review. Automated exposure control, iterative reconstruction, and/or weight-based adjustment of the mA/kV was utilized to reduce the radiation dose to as low as reasonably achievable. COMPARISON: CT chest abdomen and pelvis 08/13/2024. CLINICAL HISTORY: Epigastric pain. FINDINGS: LOWER CHEST: There is a new moderate sized right pleural effusion with compressive atelectasis of the right lower lobe. There is new right middle lobe airspace consolidation with air bronchograms. LIVER: There is a new enhancing lateral right hepatic subcapsular fluid collection containing air measuring 12.3 x 3.8 x 16.1 cm. This abuts the anterior abdominal wall. GALLBLADDER AND BILE DUCTS: Gallbladder is unremarkable. No biliary ductal dilatation. SPLEEN: No acute abnormality. PANCREAS: No acute abnormality. ADRENAL  GLANDS: No acute abnormality. KIDNEYS, URETERS AND BLADDER: There is a 3 mm calculus in the inferior pole of the right kidney. No hydronephrosis. No perinephric or periureteral stranding. Urinary bladder is unremarkable. GI AND BOWEL: Air fluid tract is seen extending from the enhancing lateral right hepatic subcapsular fluid collection to the anterior superior gastric antrum best seen on coronal images 6/70-74. There is fluid distention of the distal esophagus, unchanged. There is wall thickening of the gastric antrum as seen on prior. There is mild inflammation surrounding the gastric antrum. The appendix appears normal. There is no bowel obstruction. PERITONEUM AND RETROPERITONEUM: No ascites. No free air. VASCULATURE: Aorta is normal in caliber. Atherosclerotic calcifications of the aorta and iliac arteries. LYMPH NODES: No lymphadenopathy. REPRODUCTIVE ORGANS: Uterus is not seen. BONES AND SOFT TISSUES: No acute osseous abnormality. There is scarring in the anterior abdominal wall. IMPRESSION: 1. Large right hepatic subcapsular abscess with fistulous communication to the gastric antrum. 2. Gastric antral wall thickening with mild surrounding inflammation, similar to prior. 3. New moderate sized right pleural effusion with compressive atelectasis of the right lower lobe and new right middle lobe airspace consolidation. Electronically signed by: Greig Pique MD 08/27/2024 09:25 PM EST RP Workstation: [id number]     .Critical Care  Performed by: Edie Rosebud LABOR, PA-C Authorized by: Edie Rosebud LABOR, PA-C   Critical care provider statement:    Critical care time (minutes):  35   Critical care was necessary to treat or prevent imminent or life-threatening deterioration of the following conditions: GI bleed.   Critical care was time spent personally by me on the following activities:  Development of treatment plan with patient or surrogate, discussions with consultants, evaluation of patient's  response to treatment, examination of patient, ordering and review of laboratory studies, ordering and review of radiographic studies, ordering and performing treatments and interventions, pulse oximetry, re-evaluation of patient's condition and review of old charts    Medications Ordered in the ED  ondansetron  (ZOFRAN ) injection 4 mg (0 mg Intravenous Hold 08/27/24 1941)  potassium  chloride 10 mEq in 100 mL IVPB (has no administration in time range)  lactated ringers  infusion (has no administration in time range)  pantoprazole  (PROTONIX ) injection 40 mg (has no administration in time range)  vancomycin  (VANCOREADY) IVPB 1750 mg/350 mL (has no administration in time range)  sodium chloride  0.9 % bolus 1,000 mL (0 mLs Intravenous Stopped 08/27/24 2215)  ondansetron  (ZOFRAN ) injection 4 mg (4 mg Intravenous Given 08/27/24 1803)  morphine  (PF) 4 MG/ML injection 4 mg (4 mg Intravenous Given 08/27/24 1808)  sodium chloride  0.9 % bolus 1,000 mL (0 mLs Intravenous Stopped 08/27/24 2335)  HYDROmorphone  (DILAUDID ) injection 0.5 mg (0.5 mg Intravenous Given 08/27/24 1941)  iohexol  (OMNIPAQUE ) 350 MG/ML injection 75 mL (75 mLs Intravenous Contrast Given 08/27/24 2103)  HYDROmorphone  (DILAUDID ) injection 0.5 mg (0.5 mg Intravenous Given 08/27/24 2235)  pantoprazole  (PROTONIX ) injection 80 mg (80 mg Intravenous Given 08/27/24 2251)  piperacillin -tazobactam (ZOSYN ) IVPB 3.375 g (0 g Intravenous Stopped 08/27/24 6163)   64 year old here for evaluation of abdominal pain, nausea, vomiting and melena.  Recent admissions for upper GI bleed.  Had EGD on her most recent admission.  She is having worsening pain to her abdomen as well as now having melanotic diarrhea.  No chest pain or shortness of breath.  Will plan on labs, imaging, reassess  Labs and imaging personally viewed interpreted:  CBC without leukocytosis, hemoglobin 8.9, 2 point drop from recent admission Metabolic panel potassium 3.1, ordered IV  supplementation Magnesium  2.1 Lipase 49 UA negative for infection, does have some blood CT abdomen pelvis shows large right hepatic subcapsular abscess with fistula communication to the gastric antrum.  Gastric antrum thickening with surrounding inflammation similar to prior, new right pleural effusion with compressive atelectasis  Patient reassessed.  Discussed labs and imaging.  Will plan IV antibiotics, pain control.  Will remain NPO.  Patient declined blood transfusion if needed at this time.  Secure message sent to Dr. Kriss Ee GI who saw patient during previous admission.  Discussed with general surgery recommends IR drainage, will see in consult, medicine admit  Patient reassessed.  Pain controlled.  Will remain NPO.  Will plan on medicine admission patient started on Protonix  for GI bleed.  Downtrending hemoglobin.  Patient reassessed at bedside.  Agreeable for admission.  General surgery, medicine team, GI consulted.  Orders placed for IR eval and management   Clinical Course as of 08/27/24 2350  Sat Aug 27, 2024  2239 Discussed with Dr. Lyndel with general surgery.  Recommends IR for drainage, will see in consult. Medicine to admit [BH]  2334 Dr. Sundil with medicine for admission [BH]    Clinical Course User Index [BH] Nohemi Nicklaus A, PA-C                                    Medical Decision Making Amount and/or Complexity of Data Reviewed External Data Reviewed: labs, radiology, ECG and notes. Labs: ordered. Decision-making details documented in ED Course. Radiology: ordered and independent interpretation performed. Decision-making details documented in ED Course. ECG/medicine tests: ordered and independent interpretation performed. Decision-making details documented in ED Course.  Risk Prescription drug management. Parenteral controlled substances. Decision regarding hospitalization. Diagnosis or treatment significantly limited by social  determinants of health.        Final diagnoses:  Melena  Iron deficiency anemia due to chronic blood loss  Hepatic abscess  Gastric fistula    ED Discharge Orders  None          Taylie Helder A, PA-C 08/27/24 2350  "

## 2024-08-27 NOTE — Progress Notes (Signed)
 Pharmacy Antibiotic Note  Jennifer [name] is a 64 y.o. female admitted on 08/27/2024 with hepatic abscess and concern for PNA.  Pharmacy has been consulted for vancomycin  and Zosyn  dosing.  Plan: Vancomycin  1750mg  x1 then 750mg  IV Q12H. Goal AUC 400-550.  Expected AUC 500. Zosyn  3.375g IV Q8H (4-hour infusion).  Height: 5' 4 (162.6 cm) Weight: 81.6 kg (180 lb) IBW/kg (Calculated) : 54.7  Temp (24hrs), Avg:98.4 F (36.9 C), Min:98.1 F (36.7 C), Max:98.6 F (37 C)  Recent Labs  Lab 08/27/24 1821  WBC 7.8  CREATININE 0.90    Estimated Creatinine Clearance: 67.9 mL/min (by C-G formula based on SCr of 0.9 mg/dL).    Allergies[1]   Thank you for allowing pharmacy to be a part of this patients care.  Marvetta Dauphin, PharmD, BCPS  08/27/2024 11:49 PM     [1] No Known Allergies

## 2024-08-28 ENCOUNTER — Inpatient Hospital Stay (HOSPITAL_COMMUNITY)

## 2024-08-28 ENCOUNTER — Encounter (HOSPITAL_COMMUNITY): Payer: Self-pay | Admitting: Internal Medicine

## 2024-08-28 DIAGNOSIS — J90 Pleural effusion, not elsewhere classified: Secondary | ICD-10-CM | POA: Diagnosis not present

## 2024-08-28 DIAGNOSIS — K750 Abscess of liver: Secondary | ICD-10-CM | POA: Diagnosis not present

## 2024-08-28 DIAGNOSIS — E876 Hypokalemia: Secondary | ICD-10-CM | POA: Insufficient documentation

## 2024-08-28 DIAGNOSIS — K922 Gastrointestinal hemorrhage, unspecified: Secondary | ICD-10-CM | POA: Diagnosis not present

## 2024-08-28 LAB — COMPREHENSIVE METABOLIC PANEL WITH GFR
ALT: 42 U/L (ref 0–44)
AST: 32 U/L (ref 15–41)
Albumin: 2.5 g/dL — ABNORMAL LOW (ref 3.5–5.0)
Alkaline Phosphatase: 129 U/L — ABNORMAL HIGH (ref 38–126)
Anion gap: 11 (ref 5–15)
BUN: 14 mg/dL (ref 8–23)
CO2: 21 mmol/L — ABNORMAL LOW (ref 22–32)
Calcium: 8.8 mg/dL — ABNORMAL LOW (ref 8.9–10.3)
Chloride: 107 mmol/L (ref 98–111)
Creatinine, Ser: 0.76 mg/dL (ref 0.44–1.00)
GFR, Estimated: >60 mL/min
Glucose, Bld: 78 mg/dL (ref 70–99)
Potassium: 3.8 mmol/L (ref 3.5–5.1)
Sodium: 139 mmol/L (ref 135–145)
Total Bilirubin: 0.3 mg/dL (ref 0.0–1.2)
Total Protein: 7 g/dL (ref 6.5–8.1)

## 2024-08-28 LAB — BODY FLUID CELL COUNT WITH DIFFERENTIAL
Eos, Fluid: 2 %
Lymphs, Fluid: 77 %
Monocyte-Macrophage-Serous Fluid: 2 % — ABNORMAL LOW (ref 50–90)
Neutrophil Count, Fluid: 19 % (ref 0–25)
Total Nucleated Cell Count, Fluid: 2267 uL — ABNORMAL HIGH (ref 0–1000)

## 2024-08-28 LAB — URINE DRUG SCREEN
Amphetamines: NEGATIVE
Barbiturates: NEGATIVE
Benzodiazepines: NEGATIVE
Cocaine: NEGATIVE
Fentanyl: NEGATIVE
Methadone Scn, Ur: NEGATIVE
Opiates: POSITIVE — AB
Tetrahydrocannabinol: NEGATIVE

## 2024-08-28 LAB — CBC
HCT: 32.4 % — ABNORMAL LOW (ref 36.0–46.0)
Hemoglobin: 10.2 g/dL — ABNORMAL LOW (ref 12.0–15.0)
MCH: 29.1 pg (ref 26.0–34.0)
MCHC: 31.5 g/dL (ref 30.0–36.0)
MCV: 92.3 fL (ref 80.0–100.0)
Platelets: 647 K/uL — ABNORMAL HIGH (ref 150–400)
RBC: 3.51 MIL/uL — ABNORMAL LOW (ref 3.87–5.11)
RDW: 15.3 % (ref 11.5–15.5)
WBC: 9.8 K/uL (ref 4.0–10.5)
nRBC: 0.2 % (ref 0.0–0.2)

## 2024-08-28 LAB — STREP PNEUMONIAE URINARY ANTIGEN: Strep Pneumo Urinary Antigen: NEGATIVE

## 2024-08-28 LAB — PROTIME-INR
INR: 1.2 (ref 0.8–1.2)
Prothrombin Time: 15.7 s — ABNORMAL HIGH (ref 11.4–15.2)

## 2024-08-28 LAB — PREPARE RBC (CROSSMATCH)

## 2024-08-28 MED ORDER — MIDAZOLAM HCL (PF) 2 MG/2ML IJ SOLN
INTRAMUSCULAR | Status: AC | PRN
  Administered 2024-08-28 (×2): 1 mg via INTRAVENOUS

## 2024-08-28 MED ORDER — LIDOCAINE HCL 1 % IJ SOLN
INTRAMUSCULAR | Status: AC
  Filled 2024-08-28: qty 10

## 2024-08-28 MED ORDER — LIDOCAINE HCL (PF) 1 % IJ SOLN
10.0000 mL | Freq: Once | INTRAMUSCULAR | Status: AC
  Administered 2024-08-28: 10 mL via INTRADERMAL

## 2024-08-28 MED ORDER — FENTANYL CITRATE (PF) 100 MCG/2ML IJ SOLN
INTRAMUSCULAR | Status: AC | PRN
  Administered 2024-08-28 (×2): 50 ug via INTRAVENOUS

## 2024-08-28 MED ORDER — MELATONIN 5 MG PO TABS
5.0000 mg | ORAL_TABLET | Freq: Every day | ORAL | Status: DC
  Administered 2024-08-28: 5 mg via ORAL
  Filled 2024-08-28: qty 1

## 2024-08-28 MED ORDER — TRAZODONE HCL 50 MG PO TABS
50.0000 mg | ORAL_TABLET | Freq: Every evening | ORAL | Status: DC | PRN
  Administered 2024-08-28: 50 mg via ORAL
  Filled 2024-08-28: qty 1

## 2024-08-28 MED ORDER — SUCRALFATE 1 GM/10ML PO SUSP
1.0000 g | Freq: Four times a day (QID) | ORAL | Status: DC
  Administered 2024-08-28 – 2024-08-29 (×4): 1 g via ORAL
  Filled 2024-08-28 (×4): qty 10

## 2024-08-28 MED ORDER — MIDAZOLAM HCL 2 MG/2ML IJ SOLN
INTRAMUSCULAR | Status: AC
  Filled 2024-08-28: qty 4

## 2024-08-28 MED ORDER — FENTANYL CITRATE (PF) 100 MCG/2ML IJ SOLN
INTRAMUSCULAR | Status: AC
  Filled 2024-08-28: qty 4

## 2024-08-28 MED ORDER — SODIUM CHLORIDE 0.9% FLUSH
5.0000 mL | Freq: Three times a day (TID) | INTRAVENOUS | Status: DC
  Administered 2024-08-28 – 2024-09-02 (×7): 5 mL

## 2024-08-28 MED ORDER — SODIUM CHLORIDE 0.9% IV SOLUTION: Freq: Once | INTRAVENOUS | Status: DC

## 2024-08-28 MED ORDER — MORPHINE SULFATE (PF) 2 MG/ML IV SOLN
1.0000 mg | INTRAVENOUS | Status: DC | PRN
  Administered 2024-08-28 (×3): 2 mg via INTRAVENOUS
  Filled 2024-08-28 (×3): qty 1

## 2024-08-28 MED ORDER — MORPHINE SULFATE (PF) 2 MG/ML IV SOLN
2.0000 mg | INTRAVENOUS | Status: DC | PRN
  Administered 2024-08-28 – 2024-08-29 (×4): 2 mg via INTRAVENOUS
  Filled 2024-08-28 (×4): qty 1

## 2024-08-28 MED ORDER — OXYCODONE HCL 5 MG PO TABS
5.0000 mg | ORAL_TABLET | ORAL | Status: DC | PRN
  Administered 2024-08-28 – 2024-08-29 (×4): 5 mg via ORAL
  Filled 2024-08-28 (×4): qty 1

## 2024-08-28 NOTE — Assessment & Plan Note (Signed)
-   States she has quit drinking and low suspicion for withdrawal at this time

## 2024-08-28 NOTE — Procedures (Signed)
 Interventional Radiology Procedure Note  Procedure: CT guided right perihepatic abscess drain placement   Findings: Please refer to procedural dictation for full description. 10 Fr pigtail drain, to bulb suction.  Approximately 150 cc of tan, opaque fluid aspirated.  Complications: None immediate  Estimated Blood Loss: < 5 mL  Recommendations: Keep to bulb suction. Follow culture results. IR will follow.   Ester Sides, MD

## 2024-08-28 NOTE — Hospital Course (Addendum)
 Patient is a 64 y.o.  female with history of HTN, EtOH use, substance abuse (prior UDS positive for opiates/cocaine/benzos)-who recently had a admission for melena-EGD showed gastric ulcer-patient subsequently left AMA-presented with generalized weakness/melanotic stools/upper abdominal pain-she was found to have right hepatic abscess with fistulous communication to a contained gastric perforation.   Significant events: 1/17>> admit to [facility] 1/22>> underwent upper GI series.  Diet advanced. 1/25>> repeat CT abdomen pelvis with contrast shows persistent abscess. 1/26>> underwent upsizing of her drain with IR.  Developed fever after that.  Blood cultures ordered.  Remains on antibiotics.   Significant studies: 1/17>> CT abdomen/pelvis: Large right subhepatic subcapsular abscess with fistulous communication to the gastric antrum.  New moderate-sized right pleural effusion.  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 Currently on IV Unasyn .  And micafungin . Appreciate ID managing antibiotics. Appreciate General surgery and Gastroenterology consult. Upper GI series shows esophageal distention concerning for achalasia and no evidence of leakage of contrast. Currently on soft diet now.  Continues to report severe pain requiring pain medication. Not recommended for home IV  antibiotics.  Not a good candidate for long-term drain management at home as well. Discussed with ID as well as general surgery.  CT abdomen pelvis with contrast shows persistent abscess. IR was consulted to upsize her drain which was completed on 1/26. Continue with IV antibiotic. Due to fever repeat cultures were ordered.  Recent upper GI bleed (EGD on 1/3 showed candidiasis and gastric ulcer) Completed a course of fluconazole  On PPI twice daily-6-8 weeks   Right-sided pleural effusion S/p thoracocentesis on 1/18 Exudative by lights criteria-however cultures negative This is felt to be sympathetic effusion to hepatic abscess On empiric antibiotics as above   HTN BP stable-not on any antihypertensives   EtOH use Apparently has quit drinking-no withdrawal symptoms   History of substance abuse (UDS positive for benzo/opiates/cocaine on 1/4) Will counsel the patient prior to discharge   Noncompliance Continues to have poor insight in her medical condition. She has started to leave AMA multiple times. Multiple providers have counseled her again and again regarding the importance of current management. Multiple providers including myself have advised her of the life-threatening and life disabling risk of leaving AGAINST MEDICAL ADVICE.   Class 1 Obesity: Body mass index is 30.9 kg/m.  Placing the patient at high risk for him.   Normocytic acute on chronic blood loss anemia. Hemoglobin appears to be stable around 9.  Acute pain control. Patient reports severe pain in her abdomen. Currently able to tolerate oral soft diet but at the same time reports severe pain in her drain area. Continue current regimen of oxycodone  5 mg and 10 mg with Dilaudid  0.5 mg for refractory pain and muscle relaxer.

## 2024-08-28 NOTE — Assessment & Plan Note (Signed)
-   UDS previously positive on 08/14/24 with benzos, opiaties, cocaine - repeat UDS

## 2024-08-28 NOTE — Consult Note (Signed)
 "  Consulting Physician: Deward PARAS Abaigeal Moomaw  Referring Provider: Rosebud Fass, PA-C  Chief Complaint: Abdominal pain  Reason for Consult: Perforated gastric ulcer   Subjective   HPI: Jennifer [name] is an 64 y.o. female who is here for abdominal pain.  She has had GI bleeding issues.  She was found to have gastric ulcer on EGD two weeks ago.  She is complaining of pain that goes into her back and up into her right shoulder.  She has a history of polysubstance abuse.  She left AMA from previous admissions.  Past Medical History:  Diagnosis Date   bipolar    Bipolar 1 disorder (HCC)    Depression    Fibroids    now s/p hysterectomy   History of alcohol use 08/27/2024   Hypertension    Left ventricular hypertrophy    Polysubstance abuse (HCC)    IV drug us , cocaine on occassion, smoking and alcoholism, has been going to AA, not completely sober    Past Surgical History:  Procedure Laterality Date   ABDOMINAL HYSTERECTOMY     2003   BILATERAL SALPINGOOPHORECTOMY     01/2010   BONE BIOPSY  08/13/2024   Procedure: BIOPSY, GI;  Surgeon: Dianna Specking, MD;  Location: [facility];  Service: Gastroenterology;;   DILATION AND CURETTAGE OF UTERUS     ESOPHAGOGASTRODUODENOSCOPY N/A 08/13/2024   Procedure: EGD (ESOPHAGOGASTRODUODENOSCOPY);  Surgeon: Dianna Specking, MD;  Location: [facility];  Service: Gastroenterology;  Laterality: N/A;   EXPLORATORY LAPAROTOMY     complex pelvic mass 2011   TONSILLECTOMY      Family History  Problem Relation Age of Onset   Hypertension Mother    Hypertension Sister    Diabetes Father     Social:  reports that she has been smoking cigarettes. She has a 20 pack-year smoking history. She has never used smokeless tobacco. She reports current alcohol use. She reports that she does not currently use drugs after having used the following drugs: Cocaine and Marijuana.  Allergies: Allergies[1]  Medications: Current Outpatient Medications   Medication Instructions   [Paused] amLODipine  (NORVASC ) 10 MG tablet TAKE 1 TABLET (10 MG TOTAL) BY MOUTH DAILY (Office visit for future refills)   atorvastatin  (LIPITOR) 10 mg, Oral, Daily   Blood Pressure Monitoring (BLOOD PRESSURE CUFF) MISC 1 Units, Does not apply, Daily   dicyclomine  (BENTYL ) 20 mg, Oral, 3 times daily PRN   fluconazole  (DIFLUCAN ) 400 mg, Oral, Daily   folic acid  (FOLVITE ) 1 mg, Oral, Daily   [Paused] lisinopril -hydrochlorothiazide  (ZESTORETIC ) 20-12.5 MG tablet 2 tablets, Oral, Daily   metoprolol  succinate (TOPROL -XL) 50 mg, Oral, Daily   oxyCODONE -acetaminophen  (PERCOCET/ROXICET) 5-325 MG tablet 1 tablet, Oral, Every 6 hours PRN   pantoprazole  (PROTONIX ) 40 mg, Oral, 2 times daily   thiamine  (VITAMIN B1) 100 mg, Oral, Daily    ROS - all of the below systems have been reviewed with the patient and positives are indicated with bold text General: chills, fever or night sweats Eyes: blurry vision or double vision ENT: epistaxis or sore throat Allergy/Immunology: itchy/watery eyes or nasal congestion Hematologic/Lymphatic: bleeding problems, blood clots or swollen lymph nodes Endocrine: temperature intolerance or unexpected weight changes Breast: new or changing breast lumps or nipple discharge Resp: cough, shortness of breath, or wheezing CV: chest pain or dyspnea on exertion GI: as per HPI GU: dysuria, trouble voiding, or hematuria MSK: joint pain or joint stiffness Neuro: TIA or stroke symptoms Derm: pruritus and skin lesion changes Psych: anxiety and  depression  Objective   PE Blood pressure (!) 101/49, pulse 60, temperature 98.6 F (37 C), temperature source Oral, resp. rate 18, height 5' 4 (1.626 m), weight 81.6 kg, last menstrual period 12/04/2009, SpO2 93%. Constitutional: NAD; conversant; no deformities Eyes: Moist conjunctiva; no lid lag; anicteric; PERRL Neck: Trachea midline; no thyromegaly Lungs: Normal respiratory effort; no tactile  fremitus CV: RRR; no palpable thrills; no pitting edema GI: Abd Tender RUQ MSK: Normal range of motion of extremities; no clubbing/cyanosis Psychiatric: Appropriate affect; alert and oriented x3 Lymphatic: No palpable cervical or axillary lymphadenopathy  Results for orders placed or performed during the hospital encounter of 08/27/24 (from the past 24 hours)  Type and screen [facility]     Status: None (Preliminary result)   Collection Time: 08/27/24  6:19 PM  Result Value Ref Range   ABO/RH(D) O POS    Antibody Screen NEG    Sample Expiration 08/30/2024,2359    Unit Number [id number]    Blood Component Type RBC LR PHER2    Unit division 00    Status of Unit ISSUED    Transfusion Status OK TO TRANSFUSE    Crossmatch Result      Compatible Performed at [facility], [address], [place], [place] [address]   CBC with Differential     Status: Abnormal   Collection Time: 08/27/24  6:21 PM  Result Value Ref Range   WBC 7.8 4.0 - 10.5 K/uL   RBC 3.03 (L) 3.87 - 5.11 MIL/uL   Hemoglobin 8.9 (L) 12.0 - 15.0 g/dL   HCT 72.7 (L) 63.9 - 53.9 %   MCV 89.8 80.0 - 100.0 fL   MCH 29.4 26.0 - 34.0 pg   MCHC 32.7 30.0 - 36.0 g/dL   RDW 85.1 88.4 - 84.4 %   Platelets 528 (H) 150 - 400 K/uL   nRBC 0.4 (H) 0.0 - 0.2 %   Neutrophils Relative % 70 %   Neutro Abs 5.5 1.7 - 7.7 K/uL   Lymphocytes Relative 23 %   Lymphs Abs 1.8 0.7 - 4.0 K/uL   Monocytes Relative 7 %   Monocytes Absolute 0.5 0.1 - 1.0 K/uL   Eosinophils Relative 0 %   Eosinophils Absolute 0.0 0.0 - 0.5 K/uL   Basophils Relative 0 %   Basophils Absolute 0.0 0.0 - 0.1 K/uL   WBC Morphology See Note    Smear Review See Note    Burr Cells PRESENT    Polychromasia PRESENT   Comprehensive metabolic panel     Status: Abnormal   Collection Time: 08/27/24  6:21 PM  Result Value Ref Range   Sodium 139 135 - 145 mmol/L   Potassium 3.1 (L) 3.5 - 5.1 mmol/L   Chloride 105 98 - 111 mmol/L   CO2 22 22 -  32 mmol/L   Glucose, Bld 83 70 - 99 mg/dL   BUN 22 8 - 23 mg/dL   Creatinine, Ser 9.09 0.44 - 1.00 mg/dL   Calcium  8.9 8.9 - 10.3 mg/dL   Total Protein 6.7 6.5 - 8.1 g/dL   Albumin 2.4 (L) 3.5 - 5.0 g/dL   AST 31 15 - 41 U/L   ALT 46 (H) 0 - 44 U/L   Alkaline Phosphatase 128 (H) 38 - 126 U/L   Total Bilirubin 0.3 0.0 - 1.2 mg/dL   GFR, Estimated >39 >39 mL/min   Anion gap 13 5 - 15  Lipase, blood     Status:  None   Collection Time: 08/27/24  6:21 PM  Result Value Ref Range   Lipase 49 11 - 51 U/L  Magnesium      Status: None   Collection Time: 08/27/24  6:21 PM  Result Value Ref Range   Magnesium  2.1 1.7 - 2.4 mg/dL  Urinalysis, w/ Reflex to Culture (Infection Suspected) -Urine, Clean Catch     Status: Abnormal   Collection Time: 08/27/24  9:53 PM  Result Value Ref Range   Specimen Source URINE, CLEAN CATCH    Color, Urine YELLOW YELLOW   APPearance HAZY (A) CLEAR   Specific Gravity, Urine 1.048 (H) 1.005 - 1.030   pH 5.0 5.0 - 8.0   Glucose, UA NEGATIVE NEGATIVE mg/dL   Hgb urine dipstick LARGE (A) NEGATIVE   Bilirubin Urine NEGATIVE NEGATIVE   Ketones, ur NEGATIVE NEGATIVE mg/dL   Protein, ur NEGATIVE NEGATIVE mg/dL   Nitrite NEGATIVE NEGATIVE   Leukocytes,Ua NEGATIVE NEGATIVE   RBC / HPF >50 0 - 5 RBC/hpf   WBC, UA 0-5 0 - 5 WBC/hpf   Bacteria, UA RARE (A) NONE SEEN   Squamous Epithelial / HPF 0-5 0 - 5 /HPF  Prepare RBC (crossmatch)     Status: None   Collection Time: 08/28/24 12:16 AM  Result Value Ref Range   Order Confirmation      ORDER PROCESSED BY BLOOD BANK Performed at [facility], [address] [place], [place] [address]      Imaging Orders         CT ABDOMEN PELVIS W CONTRAST         IR THORACENTESIS RIGHT ASP PLEURAL SPACE W/IMG GUIDE         IR Radiologist Eval & Mgmt      Assessment and Plan   Jennifer [name] is an 64 y.o. female with a perforated gastric ulcer.  The perforation appears to be contained on CT and the patient does not  seem to be in clinical distress. I recommend antibiotics and IR drain placement. She should be kept NPO with PPI and carafate .  I would start TPN as I feel it may take a number of days for her stomach to heal. Eventually I would check a UGI prior to restarting diet.  Surgery team will follow to ensure she responds to these treatments.    ICD-10-CM   1. Melena  K92.1     2. 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 Mixed hyperlipidemia  E78.2 atorvastatin  (LIPITOR) tablet 10 mg       Marchelle Rinella J Cyris Maalouf, MD  [facility] Use [email or web address] to contact on call provider  New Patient Billing: [phone] - High MDM      [1] No Known Allergies  "

## 2024-08-28 NOTE — Assessment & Plan Note (Signed)
-   Moderate size right pleural effusion noted with compressive atelectasis -Also ordered for thoracentesis especially unilateral.  No mention of communication via fistulous formation on CT - Continue antibiotics as noted above - IR also consulted for right thoracentesis for therapeutic and diagnostic purposes

## 2024-08-28 NOTE — Assessment & Plan Note (Signed)
-   melanotic stools; GI was again consulted on admission; likely supportive care given known prior EGD results - continue PPI - trend cbc - She was given 1 unit PRBC on admission on 08/28/2024 - GI aware

## 2024-08-28 NOTE — Progress Notes (Signed)
" ° °  Brief Progress Note   _____________________________________________________________________________________________________________  Patient Name: Jennifer [name] Patient DOB: [birth year] Date: @TODAY @      Data: Reviewed labs, notes, VS.     Action: No action needed at this time.      Response:    _____________________________________________________________________________________________________________  [facility] RN Expeditor Sharolyn JONETTA Batman Please contact us  directly via secure chat (search for [facility]) or by calling us  at [phone] [address] [facility]).  "

## 2024-08-28 NOTE — Assessment & Plan Note (Signed)
 Replete as needed

## 2024-08-28 NOTE — Assessment & Plan Note (Addendum)
-   CT showing large right hepatic subcapsular abscess with fistulous communication to the gastric antrum - Evaluated by general surgery as well, no indication for surgical intervention at this time - surgery recommending TPN to allow stomach to heal some; will have to discuss further with patient as already asking to eat - Continue vancomycin  and Zosyn  - IR consulted.  Planning on aspiration and possible drain placement

## 2024-08-28 NOTE — Consult Note (Signed)
 "     Chief Complaint: Patient was seen in consultation today for hepatic abscess  Chief Complaint  Patient presents with   Melena   Weakness   at the request of Stechschulte, Deward / Sundil, Subrina   Referring Physician(s): Stechschulte, Paul / Sundil, Subrina   Supervising Physician: Jennefer Rover  Patient Status: [facility] - ED  History of Present Illness: Jennifer [name] is a 64 y.o. female with PMHs of HTN, bipolar 1 disorder, polysubstance abuse who was admitted due to perforated gastric ulcer and hepatic abscess, IR was consulted for drain placement.   Patient came to ED yesterday with abd pain, N/V. She was recently admitted due to GIB, EGD showed non bleeding gastric ulcer. In ED Lab showed hgb 8.9 (baseline in 12/13,) elevated LFT, hematuria, VS showed soft BP. CT A/P with showed:  1. Large right hepatic subcapsular abscess with fistulous communication to the gastric antrum. 2. Gastric antral wall thickening with mild surrounding inflammation, similar to prior. 3. New moderate sized right pleural effusion with compressive atelectasis of the right lower lobe and new right middle lobe airspace consolidation.  General surgery was consulted who recommended IR consult for hepatic abscess drain placement, surgery planning to start the patient on TPN. Case reviewed and approved by Dr. Jennefer.   Patient seen in US , she is for right thoracentesis.  Reports generalized abdomina pain, no N/V.   Past Medical History:  Diagnosis Date   bipolar    Bipolar 1 disorder (HCC)    Depression    Fibroids    now s/p hysterectomy   History of alcohol use 08/27/2024   Hypertension    Left ventricular hypertrophy    Polysubstance abuse (HCC)    IV drug us , cocaine on occassion, smoking and alcoholism, has been going to AA, not completely sober    Past Surgical History:  Procedure Laterality Date   ABDOMINAL HYSTERECTOMY     2003   BILATERAL SALPINGOOPHORECTOMY     01/2010   BONE BIOPSY   08/13/2024   Procedure: BIOPSY, GI;  Surgeon: Dianna Specking, MD;  Location: [facility];  Service: Gastroenterology;;   DILATION AND CURETTAGE OF UTERUS     ESOPHAGOGASTRODUODENOSCOPY N/A 08/13/2024   Procedure: EGD (ESOPHAGOGASTRODUODENOSCOPY);  Surgeon: Dianna Specking, MD;  Location: [facility];  Service: Gastroenterology;  Laterality: N/A;   EXPLORATORY LAPAROTOMY     complex pelvic mass 2011   TONSILLECTOMY      Allergies: Patient has no known allergies.  Medications: Prior to Admission medications  Medication Sig Start Date End Date Taking? Authorizing Provider  [Paused] amLODipine  (NORVASC ) 10 MG tablet TAKE 1 TABLET (10 MG TOTAL) BY MOUTH DAILY (Office visit for future refills) Wait to take this until your doctor or other care provider tells you to start again. 07/14/24   Newlin, Enobong, MD  atorvastatin  (LIPITOR) 10 MG tablet Take 1 tablet (10 mg total) by mouth daily. Patient not taking: Reported on 08/28/2024 07/14/24   Newlin, Enobong, MD  Blood Pressure Monitoring (BLOOD PRESSURE CUFF) MISC 1 Units by Does not apply route daily. 05/24/24   Mayers, Cari S, PA-C  dicyclomine  (BENTYL ) 20 MG tablet Take 1 tablet (20 mg total) by mouth 3 (three) times daily as needed for spasms. Patient not taking: Reported on 08/28/2024 08/22/24 09/21/24  Christobal Guadalajara, MD  fluconazole  (DIFLUCAN ) 200 MG tablet Take 2 tablets (400 mg total) by mouth daily for 6 days. Patient not taking: Reported on 08/28/2024 08/22/24 08/28/24  Christobal Guadalajara, MD  folic  acid (FOLVITE ) 1 MG tablet Take 1 tablet (1 mg total) by mouth daily. Patient not taking: Reported on 08/28/2024 08/22/24 09/21/24  Christobal Guadalajara, MD  [Paused] lisinopril -hydrochlorothiazide  (ZESTORETIC ) 20-12.5 MG tablet Take 2 tablets by mouth daily. Wait to take this until your doctor or other care provider tells you to start again. 07/14/24   Newlin, Enobong, MD  metoprolol  succinate (TOPROL -XL) 50 MG 24 hr tablet Take 1 tablet (50 mg total) by mouth  daily. Patient not taking: Reported on 08/28/2024 07/14/24   Newlin, Enobong, MD  oxyCODONE -acetaminophen  (PERCOCET/ROXICET) 5-325 MG tablet Take 1 tablet by mouth every 6 (six) hours as needed for moderate pain (pain score 4-6). Patient not taking: Reported on 08/28/2024 08/22/24   Christobal Guadalajara, MD  pantoprazole  (PROTONIX ) 40 MG tablet Take 1 tablet (40 mg total) by mouth 2 (two) times daily. Patient not taking: Reported on 08/28/2024 08/22/24 09/21/24  Christobal Guadalajara, MD  thiamine  (VITAMIN B-1) 100 MG tablet Take 1 tablet (100 mg total) by mouth daily. Patient not taking: Reported on 08/28/2024 08/22/24 09/21/24  Christobal Guadalajara, MD     Family History  Problem Relation Age of Onset   Hypertension Mother    Hypertension Sister    Diabetes Father     Social History   Socioeconomic History   Marital status: Single    Spouse name: Not on file   Number of children: 0   Years of education: Not on file   Highest education level: Some college, no degree  Occupational History   Not on file  Tobacco Use   Smoking status: Every Day    Current packs/day: 1.00    Average packs/day: 1 pack/day for 20.0 years (20.0 ttl pk-yrs)    Types: Cigarettes   Smokeless tobacco: Never   Tobacco comments:    Wants to quit.   Vaping Use   Vaping status: Never Used  Substance and Sexual Activity   Alcohol use: Yes    Comment: occ.   Drug use: Not Currently    Types: Cocaine, Marijuana   Sexual activity: Not Currently    Birth control/protection: Surgical  Other Topics Concern   Not on file  Social History Narrative   Financial assistance approved for 100% discount at [facility] and has [facility] card per Barnie Potters   07/19/2010         Social Drivers of Health   Tobacco Use: High Risk (08/28/2024)   Patient History    Smoking Tobacco Use: Every Day    Smokeless Tobacco Use: Never    Passive Exposure: Not on file  Financial Resource Strain: Not on file  Food Insecurity: No Food Insecurity (08/14/2024)   Epic     Worried About Programme Researcher, Broadcasting/film/video in the Last Year: Never true    Ran Out of Food in the Last Year: Never true  Transportation Needs: Unmet Transportation Needs (08/14/2024)   Epic    Lack of Transportation (Medical): Yes    Lack of Transportation (Non-Medical): Yes  Physical Activity: Not on file  Stress: Not on file  Social Connections: Not on file  Depression (PHQ2-9): Low Risk (05/24/2024)   Depression (PHQ2-9)    PHQ-2 Score: 1  Recent Concern: Depression (PHQ2-9) - Medium Risk (03/04/2024)   Depression (PHQ2-9)    PHQ-2 Score: 6  Alcohol Screen: Not on file  Housing: Low Risk (08/14/2024)   Epic    Unable to Pay for Housing in the Last Year: No    Number of Times Moved in  the Last Year: 0    Homeless in the Last Year: No  Utilities: Not At Risk (08/14/2024)   Epic    Threatened with loss of utilities: No  Health Literacy: Not on file     Review of Systems: A 12 point ROS discussed and pertinent positives are indicated in the HPI above.  All other systems are negative.  Vital Signs: BP (!) 98/58   Pulse 66   Temp 99.1 F (37.3 C) (Oral)   Resp 20   Ht 5' 4 (1.626 m)   Wt 180 lb (81.6 kg)   LMP 12/04/2009   SpO2 93%   BMI 30.90 kg/m    Physical Exam Vitals reviewed.  Constitutional:      General: She is not in acute distress.    Appearance: She is not ill-appearing.  HENT:     Head: Normocephalic and atraumatic.     Mouth/Throat:     Mouth: Mucous membranes are moist.     Pharynx: Oropharynx is clear.  Cardiovascular:     Rate and Rhythm: Normal rate.  Pulmonary:     Effort: Pulmonary effort is normal.  Abdominal:     General: Abdomen is flat.  Musculoskeletal:     Cervical back: Neck supple.  Skin:    General: Skin is warm and dry.     Coloration: Skin is not jaundiced.  Neurological:     Mental Status: She is alert and oriented to person, place, and time.  Psychiatric:        Mood and Affect: Mood normal.        Behavior: Behavior normal.         Judgment: Judgment normal.     MD Evaluation Airway: WNL Heart: WNL Abdomen: WNL Chest/ Lungs: WNL ASA  Classification: 3 Mallampati/Airway Score: One  Imaging: CT ABDOMEN PELVIS W CONTRAST Result Date: 08/27/2024 EXAM: CT ABDOMEN AND PELVIS WITH CONTRAST 08/27/2024 09:02:43 PM TECHNIQUE: CT of the abdomen and pelvis was performed with the administration of intravenous contrast. Multiplanar reformatted images are provided for review. Automated exposure control, iterative reconstruction, and/or weight-based adjustment of the mA/kV was utilized to reduce the radiation dose to as low as reasonably achievable. COMPARISON: CT chest abdomen and pelvis 08/13/2024. CLINICAL HISTORY: Epigastric pain. FINDINGS: LOWER CHEST: There is a new moderate sized right pleural effusion with compressive atelectasis of the right lower lobe. There is new right middle lobe airspace consolidation with air bronchograms. LIVER: There is a new enhancing lateral right hepatic subcapsular fluid collection containing air measuring 12.3 x 3.8 x 16.1 cm. This abuts the anterior abdominal wall. GALLBLADDER AND BILE DUCTS: Gallbladder is unremarkable. No biliary ductal dilatation. SPLEEN: No acute abnormality. PANCREAS: No acute abnormality. ADRENAL GLANDS: No acute abnormality. KIDNEYS, URETERS AND BLADDER: There is a 3 mm calculus in the inferior pole of the right kidney. No hydronephrosis. No perinephric or periureteral stranding. Urinary bladder is unremarkable. GI AND BOWEL: Air fluid tract is seen extending from the enhancing lateral right hepatic subcapsular fluid collection to the anterior superior gastric antrum best seen on coronal images 6/70-74. There is fluid distention of the distal esophagus, unchanged. There is wall thickening of the gastric antrum as seen on prior. There is mild inflammation surrounding the gastric antrum. The appendix appears normal. There is no bowel obstruction. PERITONEUM AND RETROPERITONEUM: No  ascites. No free air. VASCULATURE: Aorta is normal in caliber. Atherosclerotic calcifications of the aorta and iliac arteries. LYMPH NODES: No lymphadenopathy. REPRODUCTIVE ORGANS: Uterus is not seen.  BONES AND SOFT TISSUES: No acute osseous abnormality. There is scarring in the anterior abdominal wall. IMPRESSION: 1. Large right hepatic subcapsular abscess with fistulous communication to the gastric antrum. 2. Gastric antral wall thickening with mild surrounding inflammation, similar to prior. 3. New moderate sized right pleural effusion with compressive atelectasis of the right lower lobe and new right middle lobe airspace consolidation. Electronically signed by: Greig Pique MD 08/27/2024 09:25 PM EST RP Workstation: [id number]   ECHOCARDIOGRAM COMPLETE Result Date: 08/14/2024    ECHOCARDIOGRAM REPORT   Patient Name:   Jennifer [name] Date of Exam: 08/14/2024 Medical Rec #:  [id number]         Height:       64.0 in Accession #:    [id number]        Weight:       180.3 lb Date of Birth:  1961/08/03          BSA:          1.872 m Patient Age:    63 years          BP:           105/56 mmHg Patient Gender: F                 HR:           100 bpm. Exam Location:  Inpatient Procedure: 2D Echo, Cardiac Doppler and Color Doppler (Both Spectral and Color            Flow Doppler were utilized during procedure). Indications:    Chest Pain R07.9  History:        Patient has no prior history of Echocardiogram examinations.                 LVH; Risk Factors:Hypertension.  Sonographer:    Tinnie Gosling RDCS Referring Phys: [phone] DANIEL V THOMPSON IMPRESSIONS  1. Left ventricular ejection fraction, by estimation, is >75%. The left ventricle has hyperdynamic function. The left ventricle has no regional wall motion abnormalities. There is severe left ventricular hypertrophy of the apical segment. Left ventricular diastolic parameters are indeterminate.  2. Right ventricular systolic function is normal. The right ventricular size is  normal. Tricuspid regurgitation signal is inadequate for assessing PA pressure.  3. The mitral valve is normal in structure. Trivial mitral valve regurgitation. No evidence of mitral stenosis.  4. The aortic valve has an indeterminant number of cusps. Aortic valve regurgitation is not visualized. No aortic stenosis is present.  5. The inferior vena cava is normal in size with greater than 50% respiratory variability, suggesting right atrial pressure of 3 mmHg. Comparison(s): No prior Echocardiogram. 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 Recommend cardiac MRI as an outpatient for further evaluation of apical HCM. The velocity through the aortic valve is elevated, but this likely represents an increased LV gradient from HCM rather than aortic stenosis. Recommend repeat echocardiogram in ~2-3 years to make sure. FINDINGS  Left Ventricle: Left ventricular ejection fraction, by estimation, is >75%. The left ventricle has hyperdynamic function. The left ventricle has no regional wall motion abnormalities. The left ventricular internal cavity size was small. There is severe left ventricular hypertrophy of the apical segment. Left ventricular diastolic parameters are indeterminate. Right Ventricle: The right ventricular size is normal. No increase in right ventricular wall thickness. Right ventricular systolic function is normal. Tricuspid regurgitation signal is inadequate for assessing PA pressure. Left Atrium: Left atrial size was normal in size. Right Atrium:  Right atrial size was normal in size. Pericardium: There is no evidence of pericardial effusion. Mitral Valve: The mitral valve is normal in structure. Trivial mitral valve regurgitation. No evidence of mitral valve stenosis. Tricuspid Valve: The tricuspid valve is normal in structure. Tricuspid valve regurgitation is not demonstrated. No evidence of tricuspid stenosis. Aortic Valve: The aortic valve  has an indeterminant number of cusps. Aortic valve regurgitation is not visualized. No aortic stenosis is present. Aortic valve mean gradient measures 10.0 mmHg. Aortic valve peak gradient measures 19.0 mmHg. Aortic valve area, by VTI measures 3.53 cm. Pulmonic Valve: The pulmonic valve was normal in structure. Pulmonic valve regurgitation is not visualized. No evidence of pulmonic stenosis. Aorta: The aortic root and ascending aorta are structurally normal, with no evidence of dilitation. Venous: The inferior vena cava is normal in size with greater than 50% respiratory variability, suggesting right atrial pressure of 3 mmHg. IAS/Shunts: No atrial level shunt detected by color flow Doppler.  LEFT VENTRICLE PLAX 2D LVIDd:         4.60 cm   Diastology LVIDs:         2.20 cm   LV e' lateral:   9.03 cm/s LV PW:         1.40 cm   LV E/e' lateral: 9.7 LV IVS:        1.30 cm LVOT diam:     2.20 cm LV SV:         100 LV SV Index:   53 LVOT Area:     3.80 cm  RIGHT VENTRICLE         IVC TAPSE (M-mode): 2.3 cm  IVC diam: 1.20 cm LEFT ATRIUM             Index        RIGHT ATRIUM           Index LA diam:        3.30 cm 1.76 cm/m   RA Area:     12.90 cm LA Vol (A2C):   64.8 ml 34.61 ml/m  RA Volume:   27.00 ml  14.42 ml/m LA Vol (A4C):   46.4 ml 24.78 ml/m LA Biplane Vol: 54.9 ml 29.32 ml/m  AORTIC VALVE AV Area (Vmax):    3.38 cm AV Area (Vmean):   3.91 cm AV Area (VTI):     3.53 cm AV Vmax:           218.00 cm/s AV Vmean:          145.000 cm/s AV VTI:            0.283 m AV Peak Grad:      19.0 mmHg AV Mean Grad:      10.0 mmHg LVOT Vmax:         194.00 cm/s LVOT Vmean:        149.000 cm/s LVOT VTI:          0.263 m LVOT/AV VTI ratio: 0.93  AORTA Ao Root diam: 2.90 cm Ao Asc diam:  3.40 cm MITRAL VALVE MV Area (PHT): 6.88 cm    SHUNTS MV E velocity: 87.90 cm/s  Systemic VTI:  0.26 m MV A velocity: 87.30 cm/s  Systemic Diam: 2.20 cm MV E/A ratio:  1.01 Georganna Archer Electronically signed by Georganna Archer  Signature Date/Time: 08/14/2024/3:27:18 PM    Final    DG Abd Portable 1V Result Date: 08/14/2024 EXAM: 1 VIEW XRAY OF THE ABDOMEN 08/14/2024 09:11:00 AM COMPARISON: CT chest abdomen  and pelvis 08/13/2024. CLINICAL HISTORY: Abdominal pain FINDINGS: BOWEL: There is a paucity of bowel gas within the abdomen and pelvis, which limits assessment for underlying bowel pathology, specifically bowel obstruction. Within this limitation, no dilated bowel loops are noted. The paucity of bowel gas could represent a non-obstructive process, early or incomplete obstruction not yet causing significant dilation, or reduced bowel motility. SOFT TISSUES: A previous 3 mm right lower pole renal calculus is not currently seen. BONES: No acute fracture. IMPRESSION: 1. Paucity of bowel gas limits assessment for bowel obstruction, with no dilated bowel loops identified. 2. A previous 3 mm right lower pole renal calculus is not currently seen. This could indicate it has passed, is too small to be visualized on plain film, or is obscured. If there is a clinical concern for ureteral calculi, particularly if the patient has renal colic, consider further evaluation with a renal stone protocol CT. Electronically signed by: Waddell Calk MD 08/14/2024 09:18 AM EST RP Workstation: [id number]   US  RENAL Result Date: 08/14/2024 EXAM: US  Retroperitoneum Complete, Renal. 08/14/2024 07:14:38 AM TECHNIQUE: Real-time ultrasonography of the retroperitoneum renal was performed. COMPARISON: CT 08/13/2024 CLINICAL HISTORY: 409830 AKI (acute kidney injury) 409830 AKI (acute kidney injury) FINDINGS: FINDINGS: RIGHT KIDNEY/URETER: The right kidney measures 11.2 x 5.3 x 6.1 cm with a volume of 187.6 cc. Normal cortical echogenicity. No hydronephrosis. Inferior pole right kidney stone measures 5 mm. No mass. LEFT KIDNEY/URETER: Left kidney measures 11.0 x 5.3 x 5.9 cm with a volume of 180.4 cc. Normal cortical echogenicity. No hydronephrosis. No calculus. No  mass. BLADDER: Urinary bladder is decompressed. Patient voided prior to exam. IMPRESSION: 1. No acute findings. 2. Inferior pole right kidney stone measuring 5 mm. Electronically signed by: Waddell Calk MD 08/14/2024 07:21 AM EST RP Workstation: [id number]   CT CHEST ABDOMEN PELVIS WO CONTRAST Result Date: 08/13/2024 EXAM: CT CHEST, ABDOMEN AND PELVIS WITHOUT CONTRAST 08/13/2024 11:46:00 PM TECHNIQUE: CT of the chest, abdomen and pelvis was performed without the administration of intravenous contrast. Multiplanar reformatted images are provided for review. Automated exposure control, iterative reconstruction, and/or weight based adjustment of the mA/kV was utilized to reduce the radiation dose to as low as reasonably achievable. COMPARISON: Chest radiograph 08/13/2024, CT abdomen and pelvis 08/12/2024, and CT chest 08/10/2023. CLINICAL HISTORY: Abdominal pain, acute, nonlocalized; post EGD and has severe pain. FINDINGS: CHEST: MEDIASTINUM AND LYMPH NODES: Heart and pericardium are unremarkable. The central airways are clear. No mediastinal, hilar or axillary lymphadenopathy. Diffusely dilated esophagus filled with air and fluid. This is similar to the prior studies and likely represents chronic dysmotility such as achalasia. LUNGS AND PLEURA: 4 mm right lower lung nodule, series 4 image 159. No change since prior study. No imaging follow-up is indicated by size criteria. No focal consolidation or pulmonary edema. No pleural effusion. No pneumothorax. ABDOMEN AND PELVIS: LIVER: Unremarkable. GALLBLADDER AND BILE DUCTS: Increased density throughout the gallbladder likely representing vicarious contrast excretion. No bile duct dilatation. SPLEEN: No acute abnormality. PANCREAS: No acute abnormality. ADRENAL GLANDS: No acute abnormality. KIDNEYS, URETERS AND BLADDER: Right lower pole renal stone measuring 3 mm in diameter. No hydronephrosis or hydroureter. No perinephric or periureteral stranding. Residual contrast  material in the bladder. No bladder wall thickening. GI AND BOWEL: The stomach is partially decompressed, but there is evidence of gastric wall thickening in the distal stomach, similar to prior study. Residual contrast material demonstrated in the colon. The small bowel and colon are decompressed. The appendix is normal. There is no bowel  obstruction. REPRODUCTIVE ORGANS: The uterus appears surgically absent. No abnormal adnexal masses. PERITONEUM AND RETROPERITONEUM: No free air or free fluid in the abdomen or pelvis to suggest perforation. VASCULATURE: Calcification of the aorta. Aorta is normal in caliber. ABDOMINAL AND PELVIS LYMPH NODES: No lymphadenopathy. BONES AND SOFT TISSUES: Mild degenerative changes in the spine. Slight anterior subluxation of L4 on L5, likely degenerative. No change since prior study. No acute osseous abnormality. No focal soft tissue abnormality. IMPRESSION: 1. No acute findings. No evidence of bowel perforation . 2. Gastric wall thickening in the distal stomach, similar to prior study. 3. Esophageal dilatation suggests chronic esophageal dysmotility such as achalasia, similar to prior studies. 4. Nonobstructing 3 mm right lower pole renal stone. No hydronephrosis or hydroureter. Electronically signed by: Elsie Gravely MD 08/13/2024 11:55 PM EST RP Workstation: [id number]   DG Chest Port 1 View Result Date: 08/13/2024 EXAM: 1 VIEW(S) XRAY OF THE CHEST 08/13/2024 09:03:00 PM COMPARISON: CT chest 08/10/2023. CLINICAL HISTORY: Abdominal pain. FINDINGS: LUNGS AND PLEURA: No focal pulmonary opacity. No vascular congestion, edema, or consolidation. No pleural effusion. No pneumothorax. HEART AND MEDIASTINUM: Mild cardiac enlargement. Mediastinal contours appear intact. Calcification of the aorta. BONES AND SOFT TISSUES: No acute osseous abnormality. IMPRESSION: 1. No acute cardiopulmonary findings. Electronically signed by: Elsie Gravely MD 08/13/2024 09:06 PM EST RP Workstation:  [id number]   US  Abdomen Limited RUQ (LIVER/GB) Result Date: 08/12/2024 CLINICAL DATA:  Right upper quadrant abdominal pain EXAM: ULTRASOUND ABDOMEN LIMITED RIGHT UPPER QUADRANT COMPARISON:  June 12, 2016 FINDINGS: Gallbladder: No gallstones or wall thickening visualized. No sonographic Murphy sign noted by sonographer. Common bile duct: Diameter: 2 mm which is within normal limits. Liver: No focal lesion identified. Within normal limits in parenchymal echogenicity. Portal vein is patent on color Doppler imaging with normal direction of blood flow towards the liver. Other: None. IMPRESSION: No definite abnormality seen in the right upper quadrant of the abdomen. Electronically Signed   By: Lynwood Landy Raddle M.D.   On: 08/12/2024 10:27   CT ABDOMEN PELVIS W CONTRAST Result Date: 08/12/2024 CLINICAL DATA:  Acute generalized abdominal pain EXAM: CT ABDOMEN AND PELVIS WITH CONTRAST TECHNIQUE: Multidetector CT imaging of the abdomen and pelvis was performed using the standard protocol following bolus administration of intravenous contrast. RADIATION DOSE REDUCTION: This exam was performed according to the departmental dose-optimization program which includes automated exposure control, adjustment of the mA and/or kV according to patient size and/or use of iterative reconstruction technique. CONTRAST:  OMNIPAQUE  IOHEXOL  300 MG/ML  SOLN COMPARISON:  February 15, 2018 FINDINGS: Lower chest: Visualized lung bases are unremarkable. Moderately dilated esophagus is noted with moderate wall thickening suggesting esophagitis. Hepatobiliary: No focal liver abnormality is seen. No gallstones, gallbladder wall thickening, or biliary dilatation. Pancreas: Unremarkable. No pancreatic ductal dilatation or surrounding inflammatory changes. Spleen: Normal in size without focal abnormality. Adrenals/Urinary Tract: Adrenal glands are unremarkable. Kidneys are normal, without renal calculi, focal lesion, or hydronephrosis. Bladder is  unremarkable. Stomach/Bowel: Moderate wall thickening of distal stomach is noted concerning for gastritis or peptic ulcer disease. Possible ulceration is noted along lesser curvature. Endoscopy is recommended. There is no evidence of bowel obstruction. Appendix is unremarkable. Vascular/Lymphatic: Aortic atherosclerosis. No enlarged abdominal or pelvic lymph nodes. Reproductive: Status post hysterectomy. No adnexal masses. Other: No abdominal wall hernia or abnormality. No abdominopelvic ascites. Musculoskeletal: No acute or significant osseous findings. IMPRESSION: 1. Moderate wall thickening of distal stomach is noted concerning for gastritis or peptic ulcer disease. Possible ulceration is noted  along lesser curvature. Endoscopy is recommended. 2. Moderately dilated esophagus is noted with moderate wall thickening suggesting esophagitis. 3. Aortic atherosclerosis. Aortic Atherosclerosis (ICD10-I70.0). Electronically Signed   By: Lynwood Landy Raddle M.D.   On: 08/12/2024 10:02    Labs:  CBC: Recent Labs    08/15/24 0345 08/16/24 0411 08/17/24 0357 08/27/24 1821  WBC 8.6 8.1 9.6 7.8  HGB 13.0 12.1 10.8* 8.9*  HCT 39.4 36.8 33.4* 27.2*  PLT 149* 131* 141* 528*    COAGS: Recent Labs    08/13/24 2127  INR 1.0    BMP: Recent Labs    08/15/24 0345 08/16/24 0411 08/17/24 0357 08/27/24 1821  NA 142 142 142 139  K 4.7 5.0 4.6 3.1*  CL 107 109 108 105  CO2 23 21* 23 22  GLUCOSE 95 99 118* 83  BUN 27* 27* 18 22  CALCIUM  9.9 10.0 9.7 8.9  CREATININE 1.09* 1.00 1.00 0.90  GFRNONAA 58* >60 >60 >60    LIVER FUNCTION TESTS: Recent Labs    09/08/23 0918 05/24/24 1310 08/12/24 0814 08/13/24 2025 08/27/24 1821  BILITOT 0.5 0.2 0.6 0.4 0.3  AST 34 19 21 37 31  ALT 24  --  17 20 46*  ALKPHOS 51 60 63 60 128*  PROT 8.1 7.2 7.8 8.0 6.7  ALBUMIN 3.9 4.3 4.3 4.3 2.4*    TUMOR MARKERS: No results for input(s): AFPTM, CEA, CA199, CHROMGRNA in the last 8760 hours.  Assessment  and Plan: 64 y.o. female with perforated gastric ulcer and hepatic abscess who presents for aspiration and possible drain placement.   NPO since MN  VS BP soft 98/58 at 0910 hrs.  CBC yesterday no leukocytosis, hgb 8.9, plt 528  INR ordered STAT at 0848, still not collected at 1045. RN notified.   AC/AP: none  Allergies reviewed NKDA Abx on fluconazole , Zosyn , and vanc   Risks and benefits discussed with the patient including bleeding, infection, damage to adjacent structures, bowel perforation/fistula connection, and sepsis.  All of the patient's questions were answered, patient is agreeable to proceed. Consent signed and in IR.   Plan to proceed this afternoon.  Please contact IR provider on call for questions and concerns.    Thank you for this interesting consult.  I greatly enjoyed meeting Jennifer [name] and look forward to participating in their care.  A copy of this report was sent to the requesting provider on this date.  Electronically Signed: Toya VEAR Cousin, PA-C 08/28/2024, 10:45 AM   I spent a total of 40 Minutes    in face to face in clinical consultation, greater than 50% of which was counseling/coordinating care for hepatic abscess.   This chart was dictated using voice recognition software.  Despite best efforts to proofread,  errors can occur which can change the documentation meaning.   "

## 2024-08-28 NOTE — Assessment & Plan Note (Signed)
-   just completed 2 week course of fluconazole ; diagnosed with esophageal candidiasis on 08/13/24

## 2024-08-28 NOTE — Consult Note (Signed)
 [facility] Gastroenterology Consult  Referring Provider: No ref. provider found Primary Care Physician:  Delbert Clam, MD Primary Gastroenterologist: [place]   Reason for Consultation: melena, history antral ulcer, hepatic abscess  SUBJECTIVE:   HPI: Jennifer [name] is a 64 y.o. female with past medical history significant for non-bleeding, 20 mm, cratered gastric ulcer noted on EGD 08/13/2024 for melena/abdominal pain. Esophageal candidiasis was also noted at that time.   She left AMA after EGD though subsequently returned to hospital and was admitted from 08/13/24-08/22/24. During that hospital stay she received IV diflucan , recommended to be on PPI BID and have follow up EGD in 6-8 weeks. SNF was considered, though given insurance and UDS+ she was discharged to home.   Presented to hospital today with recurrent melena. Also with diffuse abdominal pain in upper abdomen with radiation to back. No chest pain. With shortness of breath. No diarrhea. Last used NSAID on 08/26/24. Was also using Percocet for pain, was not helping drastically with pain relief.   Abdominal imaging has shown R subcapsular liver fluid collection with air 12.3 x 3.8 x 16.1 cm, air tract from fluid collection to anterior superior gastric antrum.  She has been started on IV antibiotics. General Surgery has evaluated and recommended IR evaluation/drain.   Past Medical History:  Diagnosis Date   bipolar    Bipolar 1 disorder (HCC)    Depression    Fibroids    now s/p hysterectomy   History of alcohol use 08/27/2024   Hypertension    Left ventricular hypertrophy    Polysubstance abuse (HCC)    IV drug us , cocaine on occassion, smoking and alcoholism, has been going to AA, not completely sober   Past Surgical History:  Procedure Laterality Date   ABDOMINAL HYSTERECTOMY     2003   BILATERAL SALPINGOOPHORECTOMY     01/2010   BONE BIOPSY  08/13/2024   Procedure: BIOPSY, GI;  Surgeon: Dianna Specking, MD;  Location:  [facility];  Service: Gastroenterology;;   DILATION AND CURETTAGE OF UTERUS     ESOPHAGOGASTRODUODENOSCOPY N/A 08/13/2024   Procedure: EGD (ESOPHAGOGASTRODUODENOSCOPY);  Surgeon: Dianna Specking, MD;  Location: [facility];  Service: Gastroenterology;  Laterality: N/A;   EXPLORATORY LAPAROTOMY     complex pelvic mass 2011   TONSILLECTOMY     Prior to Admission medications  Medication Sig Start Date End Date Taking? Authorizing Provider  [Paused] amLODipine  (NORVASC ) 10 MG tablet TAKE 1 TABLET (10 MG TOTAL) BY MOUTH DAILY (Office visit for future refills) Wait to take this until your doctor or other care provider tells you to start again. 07/14/24   Newlin, Enobong, MD  atorvastatin  (LIPITOR) 10 MG tablet Take 1 tablet (10 mg total) by mouth daily. Patient not taking: Reported on 08/28/2024 07/14/24   Newlin, Enobong, MD  Blood Pressure Monitoring (BLOOD PRESSURE CUFF) MISC 1 Units by Does not apply route daily. 05/24/24   Mayers, Cari S, PA-C  dicyclomine  (BENTYL ) 20 MG tablet Take 1 tablet (20 mg total) by mouth 3 (three) times daily as needed for spasms. Patient not taking: Reported on 08/28/2024 08/22/24 09/21/24  Christobal Guadalajara, MD  fluconazole  (DIFLUCAN ) 200 MG tablet Take 2 tablets (400 mg total) by mouth daily for 6 days. Patient not taking: Reported on 08/28/2024 08/22/24 08/28/24  Christobal Guadalajara, MD  folic acid  (FOLVITE ) 1 MG tablet Take 1 tablet (1 mg total) by mouth daily. Patient not taking: Reported on 08/28/2024 08/22/24 09/21/24  Christobal Guadalajara, MD  [Paused] lisinopril -hydrochlorothiazide  (ZESTORETIC ) 20-12.5 MG tablet  Take 2 tablets by mouth daily. Wait to take this until your doctor or other care provider tells you to start again. 07/14/24   Newlin, Enobong, MD  metoprolol  succinate (TOPROL -XL) 50 MG 24 hr tablet Take 1 tablet (50 mg total) by mouth daily. Patient not taking: Reported on 08/28/2024 07/14/24   Newlin, Enobong, MD  oxyCODONE -acetaminophen  (PERCOCET/ROXICET) 5-325 MG tablet Take 1  tablet by mouth every 6 (six) hours as needed for moderate pain (pain score 4-6). Patient not taking: Reported on 08/28/2024 08/22/24   Christobal Guadalajara, MD  pantoprazole  (PROTONIX ) 40 MG tablet Take 1 tablet (40 mg total) by mouth 2 (two) times daily. Patient not taking: Reported on 08/28/2024 08/22/24 09/21/24  Christobal Guadalajara, MD  thiamine  (VITAMIN B-1) 100 MG tablet Take 1 tablet (100 mg total) by mouth daily. Patient not taking: Reported on 08/28/2024 08/22/24 09/21/24  Christobal Guadalajara, MD   Current Facility-Administered Medications  Medication Dose Route Frequency Provider Last Rate Last Admin   0.9 %  sodium chloride  infusion (Manually program via Guardrails IV Fluids)   Intravenous Once Sundil, Subrina, MD   Held at 08/28/24 0147   0.9 %  sodium chloride  infusion  250 mL Intravenous PRN Sundil, Subrina, MD       acetaminophen  (TYLENOL ) tablet 650 mg  650 mg Oral Q6H PRN Sundil, Subrina, MD   650 mg at 08/28/24 0932   Or   acetaminophen  (TYLENOL ) suppository 650 mg  650 mg Rectal Q6H PRN Sundil, Subrina, MD       atorvastatin  (LIPITOR) tablet 10 mg  10 mg Oral Daily Sundil, Subrina, MD       dicyclomine  (BENTYL ) tablet 20 mg  20 mg Oral TID PRN Sundil, Subrina, MD   20 mg at 08/28/24 1141   folic acid  (FOLVITE ) tablet 1 mg  1 mg Oral Daily Sundil, Subrina, MD   1 mg at 08/28/24 9071   lactated ringers  infusion   Intravenous Continuous Sundil, Subrina, MD 125 mL/hr at 08/28/24 0535 New Bag at 08/28/24 0535   lidocaine  (PF) (XYLOCAINE ) 1 % injection 10 mL  10 mL Intradermal Once Henderly, Britni A, PA-C       morphine  (PF) 2 MG/ML injection 1-2 mg  1-2 mg Intravenous Q4H PRN Sundil, Subrina, MD   2 mg at 08/28/24 1215   ondansetron  (ZOFRAN ) injection 4 mg  4 mg Intravenous Once Sundil, Subrina, MD       pantoprazole  (PROTONIX ) injection 40 mg  40 mg Intravenous Q12H Sundil, Subrina, MD   40 mg at 08/28/24 9076   piperacillin -tazobactam (ZOSYN ) IVPB 3.375 g  3.375 g Intravenous Q8H Mattie Marvetta SQUIBB, RPH 12.5 mL/hr  at 08/28/24 1143 3.375 g at 08/28/24 1143   sodium chloride  flush (NS) 0.9 % injection 3 mL  3 mL Intravenous Q12H Sundil, Subrina, MD   3 mL at 08/28/24 0146   sodium chloride  flush (NS) 0.9 % injection 3 mL  3 mL Intravenous Q12H Sundil, Subrina, MD   3 mL at 08/28/24 0147   sodium chloride  flush (NS) 0.9 % injection 3 mL  3 mL Intravenous PRN Sundil, Subrina, MD       trimethobenzamide  (TIGAN ) injection 200 mg  200 mg Intramuscular Q6H PRN Sundil, Subrina, MD       vancomycin  (VANCOREADY) IVPB 750 mg/150 mL  750 mg Intravenous Q12H Mattie Marvetta SQUIBB, RPH 150 mL/hr at 08/28/24 1222 750 mg at 08/28/24 1222   Current Outpatient Medications  Medication Sig Dispense Refill   [Paused] amLODipine  (NORVASC )  10 MG tablet TAKE 1 TABLET (10 MG TOTAL) BY MOUTH DAILY (Office visit for future refills) 30 tablet 1   atorvastatin  (LIPITOR) 10 MG tablet Take 1 tablet (10 mg total) by mouth daily. (Patient not taking: Reported on 08/28/2024) 90 tablet 1   Blood Pressure Monitoring (BLOOD PRESSURE CUFF) MISC 1 Units by Does not apply route daily. 1 each 0   dicyclomine  (BENTYL ) 20 MG tablet Take 1 tablet (20 mg total) by mouth 3 (three) times daily as needed for spasms. (Patient not taking: Reported on 08/28/2024) 30 tablet 0   fluconazole  (DIFLUCAN ) 200 MG tablet Take 2 tablets (400 mg total) by mouth daily for 6 days. (Patient not taking: Reported on 08/28/2024) 12 tablet 0   folic acid  (FOLVITE ) 1 MG tablet Take 1 tablet (1 mg total) by mouth daily. (Patient not taking: Reported on 08/28/2024) 30 tablet 0   [Paused] lisinopril -hydrochlorothiazide  (ZESTORETIC ) 20-12.5 MG tablet Take 2 tablets by mouth daily. 30 tablet 1   metoprolol  succinate (TOPROL -XL) 50 MG 24 hr tablet Take 1 tablet (50 mg total) by mouth daily. (Patient not taking: Reported on 08/28/2024) 90 tablet 1   oxyCODONE -acetaminophen  (PERCOCET/ROXICET) 5-325 MG tablet Take 1 tablet by mouth every 6 (six) hours as needed for moderate pain (pain score 4-6).  (Patient not taking: Reported on 08/28/2024) 10 tablet 0   pantoprazole  (PROTONIX ) 40 MG tablet Take 1 tablet (40 mg total) by mouth 2 (two) times daily. (Patient not taking: Reported on 08/28/2024) 60 tablet 1   thiamine  (VITAMIN B-1) 100 MG tablet Take 1 tablet (100 mg total) by mouth daily. (Patient not taking: Reported on 08/28/2024) 30 tablet 0   Allergies as of 08/27/2024   (No Known Allergies)   Family History  Problem Relation Age of Onset   Hypertension Mother    Hypertension Sister    Diabetes Father    Social History   Socioeconomic History   Marital status: Single    Spouse name: Not on file   Number of children: 0   Years of education: Not on file   Highest education level: Some college, no degree  Occupational History   Not on file  Tobacco Use   Smoking status: Every Day    Current packs/day: 1.00    Average packs/day: 1 pack/day for 20.0 years (20.0 ttl pk-yrs)    Types: Cigarettes   Smokeless tobacco: Never   Tobacco comments:    Wants to quit.   Vaping Use   Vaping status: Never Used  Substance and Sexual Activity   Alcohol use: Yes    Comment: occ.   Drug use: Not Currently    Types: Cocaine, Marijuana   Sexual activity: Not Currently    Birth control/protection: Surgical  Other Topics Concern   Not on file  Social History Narrative   Financial assistance approved for 100% discount at [facility] and has [facility] card per Barnie Potters   07/19/2010         Social Drivers of Health   Tobacco Use: High Risk (08/28/2024)   Patient History    Smoking Tobacco Use: Every Day    Smokeless Tobacco Use: Never    Passive Exposure: Not on file  Financial Resource Strain: Not on file  Food Insecurity: No Food Insecurity (08/14/2024)   Epic    Worried About Programme Researcher, Broadcasting/film/video in the Last Year: Never true    Ran Out of Food in the Last Year: Never true  Transportation Needs: Unmet Transportation Needs (08/14/2024)  Epic    Lack of Transportation (Medical): Yes    Lack  of Transportation (Non-Medical): Yes  Physical Activity: Not on file  Stress: Not on file  Social Connections: Not on file  Intimate Partner Violence: Not At Risk (08/14/2024)   Epic    Fear of Current or Ex-Partner: No    Emotionally Abused: No    Physically Abused: No    Sexually Abused: No  Depression (PHQ2-9): Low Risk (05/24/2024)   Depression (PHQ2-9)    PHQ-2 Score: 1  Recent Concern: Depression (PHQ2-9) - Medium Risk (03/04/2024)   Depression (PHQ2-9)    PHQ-2 Score: 6  Alcohol Screen: Not on file  Housing: Low Risk (08/14/2024)   Epic    Unable to Pay for Housing in the Last Year: No    Number of Times Moved in the Last Year: 0    Homeless in the Last Year: No  Utilities: Not At Risk (08/14/2024)   Epic    Threatened with loss of utilities: No  Health Literacy: Not on file   Review of Systems:  Review of Systems  Respiratory:  Positive for shortness of breath.   Cardiovascular:  Negative for chest pain.  Gastrointestinal:  Positive for abdominal pain and melena. Negative for constipation, diarrhea, nausea and vomiting.    OBJECTIVE:   Temp:  [97.8 F (36.6 C)-99.1 F (37.3 C)] 99.1 F (37.3 C) (01/18 0912) Pulse Rate:  [58-78] 72 (01/18 1130) Resp:  [18-28] 18 (01/18 1130) BP: (88-113)/(46-88) 113/88 (01/18 1130) SpO2:  [93 %-100 %] 95 % (01/18 1130) Weight:  [81.6 kg] 81.6 kg (01/17 1637)   Physical Exam Constitutional:      General: She is not in acute distress.    Appearance: She is not ill-appearing, toxic-appearing or diaphoretic.  Cardiovascular:     Rate and Rhythm: Normal rate and regular rhythm.  Pulmonary:     Effort: No respiratory distress.     Breath sounds: Normal breath sounds.  Abdominal:     General: Bowel sounds are normal. There is no distension.     Palpations: Abdomen is soft.     Tenderness: There is abdominal tenderness.  Musculoskeletal:     Right lower leg: No edema.     Left lower leg: No edema.  Skin:    General: Skin is warm  and dry.  Neurological:     Mental Status: She is alert.     Labs: Recent Labs    08/27/24 1821  WBC 7.8  HGB 8.9*  HCT 27.2*  PLT 528*   BMET Recent Labs    08/27/24 1821  NA 139  K 3.1*  CL 105  CO2 22  GLUCOSE 83  BUN 22  CREATININE 0.90  CALCIUM  8.9   LFT Recent Labs    08/27/24 1821  PROT 6.7  ALBUMIN 2.4*  AST 31  ALT 46*  ALKPHOS 128*  BILITOT 0.3   PT/INR No results for input(s): LABPROT, INR in the last 72 hours.  Diagnostic imaging: DG Chest 1 View Result Date: 08/28/2024 EXAM: 1 VIEW(S) XRAY OF THE CHEST 08/28/2024 10:59:00 AM COMPARISON: 08/13/2024 CLINICAL HISTORY: Pleural effusion on right. FINDINGS: LUNGS AND PLEURA: Low lung volumes with progressive basilar atelectasis. Confluent airspace opacities at right lung base. Small right pleural effusion. No pneumothorax. HEART AND MEDIASTINUM: Aortic atherosclerosis. No acute abnormality of the cardiac and mediastinal silhouettes. BONES AND SOFT TISSUES: No acute osseous abnormality. IMPRESSION: 1. Small right pleural effusion. 2. Low lung volumes with progressive basilar  atelectasis and confluent right basilar airspace opacities. 3. Aortic atherosclerosis. Electronically signed by: Evalene Coho MD 08/28/2024 11:16 AM EST RP Workstation: [id number]   CT ABDOMEN PELVIS W CONTRAST Result Date: 08/27/2024 EXAM: CT ABDOMEN AND PELVIS WITH CONTRAST 08/27/2024 09:02:43 PM TECHNIQUE: CT of the abdomen and pelvis was performed with the administration of intravenous contrast. Multiplanar reformatted images are provided for review. Automated exposure control, iterative reconstruction, and/or weight-based adjustment of the mA/kV was utilized to reduce the radiation dose to as low as reasonably achievable. COMPARISON: CT chest abdomen and pelvis 08/13/2024. CLINICAL HISTORY: Epigastric pain. FINDINGS: LOWER CHEST: There is a new moderate sized right pleural effusion with compressive atelectasis of the right lower  lobe. There is new right middle lobe airspace consolidation with air bronchograms. LIVER: There is a new enhancing lateral right hepatic subcapsular fluid collection containing air measuring 12.3 x 3.8 x 16.1 cm. This abuts the anterior abdominal wall. GALLBLADDER AND BILE DUCTS: Gallbladder is unremarkable. No biliary ductal dilatation. SPLEEN: No acute abnormality. PANCREAS: No acute abnormality. ADRENAL GLANDS: No acute abnormality. KIDNEYS, URETERS AND BLADDER: There is a 3 mm calculus in the inferior pole of the right kidney. No hydronephrosis. No perinephric or periureteral stranding. Urinary bladder is unremarkable. GI AND BOWEL: Air fluid tract is seen extending from the enhancing lateral right hepatic subcapsular fluid collection to the anterior superior gastric antrum best seen on coronal images 6/70-74. There is fluid distention of the distal esophagus, unchanged. There is wall thickening of the gastric antrum as seen on prior. There is mild inflammation surrounding the gastric antrum. The appendix appears normal. There is no bowel obstruction. PERITONEUM AND RETROPERITONEUM: No ascites. No free air. VASCULATURE: Aorta is normal in caliber. Atherosclerotic calcifications of the aorta and iliac arteries. LYMPH NODES: No lymphadenopathy. REPRODUCTIVE ORGANS: Uterus is not seen. BONES AND SOFT TISSUES: No acute osseous abnormality. There is scarring in the anterior abdominal wall. IMPRESSION: 1. Large right hepatic subcapsular abscess with fistulous communication to the gastric antrum. 2. Gastric antral wall thickening with mild surrounding inflammation, similar to prior. 3. New moderate sized right pleural effusion with compressive atelectasis of the right lower lobe and new right middle lobe airspace consolidation. Electronically signed by: Greig Pique MD 08/27/2024 09:25 PM EST RP Workstation: [id number]   IMPRESSION: Perforated gastric antrum ulcer with fistulous connection to hepatic fluid  collection Abdominal pain secondary to above Melena Acute blood loss anemia NSAID use History polysubstance use  PLAN: - Pending IR evaluation/management - Avoidance all NSAIDS, IV PPI Q12Hr, sucralfate  suspension 1 gm PO QID - Trend H/H, transfuse for Hgb < 7  - Monitor bowel movements, consider repeat EGD given melena, though defer in setting of currently un-drained fluid collection - [facility] will follow   LOS: 1 day   [name], [facility]enter[facility]

## 2024-08-28 NOTE — Progress Notes (Incomplete)
 CT guided Liver abscess drain done by Dr. Jennefer, IR. 10Ff drain placed at RUQ.  Approximately 10ml of grayish colored fluid sent to lab. Approximately of grayish  fluid aspirated during procedure. 2mg  of Versed  of Fentanyl  used for sedation.

## 2024-08-28 NOTE — Progress Notes (Signed)
 " Progress Note    CAI FLOTT   FMW:[id number]  DOB: 04/11/1961  DOA: 08/27/2024     1 PCP: Delbert Clam, MD  Initial CC: abd pain  Hospital Course: [name] is a 64 year old female with PMH HTN, polysubstance abuse, bipolar 1 disorder, GI bleed (recent admission 1/3 for melena s/p EGD showing esophageal plaques suspicious for candidiasis, nonbleeding gastric ulcer and gastritis).  Patient left AMA before completion of treatment.  Also PMH of HTN, tobacco use, thrombocytopenia, deconditioning/debility, morbid obesity. She presented with melanotic stools and generalized weakness. Hgb 8.9 g/dL on workup down from approximately 12 g/dL during last hospitalization. CT A/P showed large right hepatic subcapsular abscess with fistulous communication to the gastric antrum.  Gastric antral wall thickening with mild surrounding inflammation and new moderate right sized pleural effusion with compressive atelectasis.  She was also found to be hypotensive with SBP in the 80s.  She was started on antibiotics and GI again consulted on admission along with IR.  Interval History:  Sitting in bed in the ER when seen.  Feels a little bit better but still having abdominal pain and some shortness of breath even after thoracentesis. Asking to eat.  Assessment and Plan: * GI bleed - melanotic stools; GI was again consulted on admission; likely supportive care given known prior EGD results - continue PPI - trend cbc - She was given 1 unit PRBC on admission on 08/28/2024  Hepatic abscess - CT showing large right hepatic subcapsular abscess with fistulous communication to the gastric antrum - Evaluated by general surgery as well, no indication for surgical intervention at this time - surgery recommending TPN to allow stomach to heal some; will have to discuss further with patient as already asking to eat - Continue vancomycin  and Zosyn  - IR consulted.  Planning on aspiration and possible drain  placement  History of GI bleed - Underwent EGD on 08/13/2024. Found to have mid/distal esophageal candidiasis. 1 non bleeding cratered gastric ulcer. Erythema and congestion in duodenal bulb - was recommended for 2 week fluconazole  and BID PPI then GI followup in 6-8 weeks   Pleural effusion on right - Moderate size right pleural effusion noted with compressive atelectasis -Also ordered for thoracentesis especially unilateral.  No mention of communication via fistulous formation on CT - Continue antibiotics as noted above - IR also consulted for right thoracentesis for therapeutic and diagnostic purposes  Candidiasis of esophagus (HCC) - just completed 2 week course of fluconazole ; diagnosed with esophageal candidiasis on 08/13/24  Hypokalemia - Replete as needed  History of alcohol use - States she has quit drinking and low suspicion for withdrawal at this time  Essential hypertension - BP soft currently, no meds  Substance abuse (HCC) - UDS previously positive on 08/14/24 with benzos, opiaties, cocaine - repeat UDS   Antimicrobials: Zosyn  08/27/2024 >> current Vancomycin  08/28/2024 >> current  Consultants:  General surgery GI IR  Procedures:  08/28/2024: Right thoracentesis  DVT prophylaxis:  SCDs Start: 08/27/24 2352 Place TED hose Start: 08/27/24 2352   Code Status:   Code Status: Full Code  Barriers to discharge: None Therapy evaluation: PT Orders:   PT Follow up Rec:   Disposition Plan: Home Status is: Inpatient  Mobility Assessment (Last 72 Hours)     Mobility Assessment   No documentation.     Diet: Diet Orders (From admission, onward)     Start     Ordered   08/28/24 0643  Diet NPO time specified  Diet  effective now        08/28/24 0643            Objective: Blood pressure 113/88, pulse 72, temperature 99.1 F (37.3 C), temperature source Oral, resp. rate 18, height 5' 4 (1.626 m), weight 81.6 kg, last menstrual period 12/04/2009, SpO2  95%.  Examination:  Physical Exam Constitutional:      Appearance: Normal appearance.  HENT:     Head: Normocephalic and atraumatic.     Mouth/Throat:     Mouth: Mucous membranes are moist.  Eyes:     Extraocular Movements: Extraocular movements intact.  Cardiovascular:     Rate and Rhythm: Normal rate and regular rhythm.  Pulmonary:     Effort: Pulmonary effort is normal. No respiratory distress.     Breath sounds: Normal breath sounds. No wheezing.  Abdominal:     General: Bowel sounds are normal. There is no distension.     Palpations: Abdomen is soft.     Tenderness: There is abdominal tenderness in the right upper quadrant.  Musculoskeletal:        General: Normal range of motion.     Cervical back: Normal range of motion and neck supple.  Skin:    General: Skin is warm and dry.  Neurological:     General: No focal deficit present.     Mental Status: She is alert.  Psychiatric:        Mood and Affect: Mood normal.      Data Reviewed: Results for orders placed or performed during the hospital encounter of 08/27/24 (from the past 24 hours)  Type and screen [facility]     Status: None (Preliminary result)   Collection Time: 08/27/24  6:19 PM  Result Value Ref Range   ABO/RH(D) O POS    Antibody Screen NEG    Sample Expiration 08/30/2024,2359    Unit Number [id number]    Blood Component Type RBC LR PHER2    Unit division 00    Status of Unit ISSUED    Transfusion Status OK TO TRANSFUSE    Crossmatch Result      Compatible Performed at [facility], [address] [place], [place] [address]   CBC with Differential     Status: Abnormal   Collection Time: 08/27/24  6:21 PM  Result Value Ref Range   WBC 7.8 4.0 - 10.5 K/uL   RBC 3.03 (L) 3.87 - 5.11 MIL/uL   Hemoglobin 8.9 (L) 12.0 - 15.0 g/dL   HCT 72.7 (L) 63.9 - 53.9 %   MCV 89.8 80.0 - 100.0 fL   MCH 29.4 26.0 - 34.0 pg   MCHC 32.7 30.0 - 36.0 g/dL   RDW 85.1 88.4 - 84.4 %    Platelets 528 (H) 150 - 400 K/uL   nRBC 0.4 (H) 0.0 - 0.2 %   Neutrophils Relative % 70 %   Neutro Abs 5.5 1.7 - 7.7 K/uL   Lymphocytes Relative 23 %   Lymphs Abs 1.8 0.7 - 4.0 K/uL   Monocytes Relative 7 %   Monocytes Absolute 0.5 0.1 - 1.0 K/uL   Eosinophils Relative 0 %   Eosinophils Absolute 0.0 0.0 - 0.5 K/uL   Basophils Relative 0 %   Basophils Absolute 0.0 0.0 - 0.1 K/uL   WBC Morphology See Note    Smear Review See Note    Burr Cells PRESENT    Polychromasia PRESENT   Comprehensive metabolic panel  Status: Abnormal   Collection Time: 08/27/24  6:21 PM  Result Value Ref Range   Sodium 139 135 - 145 mmol/L   Potassium 3.1 (L) 3.5 - 5.1 mmol/L   Chloride 105 98 - 111 mmol/L   CO2 22 22 - 32 mmol/L   Glucose, Bld 83 70 - 99 mg/dL   BUN 22 8 - 23 mg/dL   Creatinine, Ser 9.09 0.44 - 1.00 mg/dL   Calcium  8.9 8.9 - 10.3 mg/dL   Total Protein 6.7 6.5 - 8.1 g/dL   Albumin 2.4 (L) 3.5 - 5.0 g/dL   AST 31 15 - 41 U/L   ALT 46 (H) 0 - 44 U/L   Alkaline Phosphatase 128 (H) 38 - 126 U/L   Total Bilirubin 0.3 0.0 - 1.2 mg/dL   GFR, Estimated >39 >39 mL/min   Anion gap 13 5 - 15  Lipase, blood     Status: None   Collection Time: 08/27/24  6:21 PM  Result Value Ref Range   Lipase 49 11 - 51 U/L  Magnesium      Status: None   Collection Time: 08/27/24  6:21 PM  Result Value Ref Range   Magnesium  2.1 1.7 - 2.4 mg/dL  Urinalysis, w/ Reflex to Culture (Infection Suspected) -Urine, Clean Catch     Status: Abnormal   Collection Time: 08/27/24  9:53 PM  Result Value Ref Range   Specimen Source URINE, CLEAN CATCH    Color, Urine YELLOW YELLOW   APPearance HAZY (A) CLEAR   Specific Gravity, Urine 1.048 (H) 1.005 - 1.030   pH 5.0 5.0 - 8.0   Glucose, UA NEGATIVE NEGATIVE mg/dL   Hgb urine dipstick LARGE (A) NEGATIVE   Bilirubin Urine NEGATIVE NEGATIVE   Ketones, ur NEGATIVE NEGATIVE mg/dL   Protein, ur NEGATIVE NEGATIVE mg/dL   Nitrite NEGATIVE NEGATIVE   Leukocytes,Ua NEGATIVE  NEGATIVE   RBC / HPF >50 0 - 5 RBC/hpf   WBC, UA 0-5 0 - 5 WBC/hpf   Bacteria, UA RARE (A) NONE SEEN   Squamous Epithelial / HPF 0-5 0 - 5 /HPF  Prepare RBC (crossmatch)     Status: None   Collection Time: 08/28/24 12:16 AM  Result Value Ref Range   Order Confirmation      ORDER PROCESSED BY BLOOD BANK Performed at [facility], [address], [place], [place] [address]   Culture, blood (Routine X 2) w Reflex to ID Panel     Status: None (Preliminary result)   Collection Time: 08/28/24  1:09 AM   Specimen: BLOOD  Result Value Ref Range   Specimen Description BLOOD SITE NOT SPECIFIED    Special Requests      BOTTLES DRAWN AEROBIC AND ANAEROBIC Blood Culture results may not be optimal due to an inadequate volume of blood received in culture bottles   Culture      NO GROWTH < 12 HOURS Performed at [facility], [address], [place], [place] [address]    Report Status PENDING     I have reviewed pertinent nursing notes, vitals, labs, and images as necessary. I have ordered labwork to follow up on as indicated.  I have reviewed the last notes from staff over past 24 hours. I have discussed patient's care plan and test results with nursing staff, CM/SW, and other staff as appropriate.  Old records reviewed in assessment of this patient  Time spent: Greater than 50% of the 55 minute visit was spent in  counseling/coordination of care for the patient as laid out in the A&P.   LOS: 1 day   Alm Apo, MD [facility] 08/28/2024, 12:59 PM "

## 2024-08-28 NOTE — Assessment & Plan Note (Signed)
-   BP soft currently, no meds

## 2024-08-28 NOTE — ED Notes (Signed)
 Pt raising voice to go to bathroom, attempting to get out of bed. Pt pulling at IV and insulting this tech, before being helped to the restroom.

## 2024-08-28 NOTE — Assessment & Plan Note (Addendum)
-   Underwent EGD on 08/13/2024. Found to have mid/distal esophageal candidiasis. 1 non bleeding cratered gastric ulcer. Erythema and congestion in duodenal bulb - was recommended for 2 week fluconazole  and BID PPI then GI followup in 6-8 weeks

## 2024-08-28 NOTE — Plan of Care (Signed)
" °  Discussed and clarified with patient at the bedside.   Patient is giving verbal consent for blood transfusion.   Arienne Gartin, MD [facility] 08/28/2024, 12:15 AM   "

## 2024-08-29 ENCOUNTER — Inpatient Hospital Stay (HOSPITAL_COMMUNITY)

## 2024-08-29 DIAGNOSIS — K921 Melena: Principal | ICD-10-CM

## 2024-08-29 DIAGNOSIS — F1010 Alcohol abuse, uncomplicated: Secondary | ICD-10-CM | POA: Diagnosis not present

## 2024-08-29 DIAGNOSIS — K255 Chronic or unspecified gastric ulcer with perforation: Secondary | ICD-10-CM | POA: Diagnosis not present

## 2024-08-29 DIAGNOSIS — K922 Gastrointestinal hemorrhage, unspecified: Secondary | ICD-10-CM | POA: Diagnosis not present

## 2024-08-29 DIAGNOSIS — J90 Pleural effusion, not elsewhere classified: Secondary | ICD-10-CM | POA: Diagnosis not present

## 2024-08-29 DIAGNOSIS — F1910 Other psychoactive substance abuse, uncomplicated: Secondary | ICD-10-CM | POA: Diagnosis not present

## 2024-08-29 DIAGNOSIS — B3781 Candidal esophagitis: Secondary | ICD-10-CM

## 2024-08-29 DIAGNOSIS — L988 Other specified disorders of the skin and subcutaneous tissue: Secondary | ICD-10-CM

## 2024-08-29 DIAGNOSIS — K750 Abscess of liver: Secondary | ICD-10-CM | POA: Diagnosis not present

## 2024-08-29 LAB — BPAM RBC
Blood Product Expiration Date: 202602082359
ISSUE DATE / TIME: 202601180113
Unit Type and Rh: 5100

## 2024-08-29 LAB — BASIC METABOLIC PANEL WITH GFR
Anion gap: 12 (ref 5–15)
BUN: 12 mg/dL (ref 8–23)
CO2: 20 mmol/L — ABNORMAL LOW (ref 22–32)
Calcium: 8.6 mg/dL — ABNORMAL LOW (ref 8.9–10.3)
Chloride: 105 mmol/L (ref 98–111)
Creatinine, Ser: 0.75 mg/dL (ref 0.44–1.00)
GFR, Estimated: >60 mL/min
Glucose, Bld: 90 mg/dL (ref 70–99)
Potassium: 3.8 mmol/L (ref 3.5–5.1)
Sodium: 138 mmol/L (ref 135–145)

## 2024-08-29 LAB — CBC WITH DIFFERENTIAL/PLATELET
Abs Immature Granulocytes: 0.07 K/uL (ref 0.00–0.07)
Basophils Absolute: 0 K/uL (ref 0.0–0.1)
Basophils Relative: 0 %
Eosinophils Absolute: 0.1 K/uL (ref 0.0–0.5)
Eosinophils Relative: 1 %
HCT: 28.5 % — ABNORMAL LOW (ref 36.0–46.0)
Hemoglobin: 9.3 g/dL — ABNORMAL LOW (ref 12.0–15.0)
Immature Granulocytes: 1 %
Lymphocytes Relative: 17 %
Lymphs Abs: 1.4 K/uL (ref 0.7–4.0)
MCH: 29.2 pg (ref 26.0–34.0)
MCHC: 32.6 g/dL (ref 30.0–36.0)
MCV: 89.3 fL (ref 80.0–100.0)
Monocytes Absolute: 0.8 K/uL (ref 0.1–1.0)
Monocytes Relative: 10 %
Neutro Abs: 5.8 K/uL (ref 1.7–7.7)
Neutrophils Relative %: 71 %
Platelets: 601 K/uL — ABNORMAL HIGH (ref 150–400)
RBC: 3.19 MIL/uL — ABNORMAL LOW (ref 3.87–5.11)
RDW: 15.5 % (ref 11.5–15.5)
Smear Review: NORMAL
WBC: 8.1 K/uL (ref 4.0–10.5)
nRBC: 0.4 % — ABNORMAL HIGH (ref 0.0–0.2)

## 2024-08-29 LAB — PROTEIN, BODY FLUID (OTHER): Total Protein, Body Fluid Other: 3.9 g/dL

## 2024-08-29 LAB — CBG MONITORING, ED: Glucose-Capillary: 113 mg/dL — ABNORMAL HIGH (ref 70–99)

## 2024-08-29 LAB — GLUCOSE, BODY FLUID OTHER: Glucose, Body Fluid Other: 92 mg/dL

## 2024-08-29 LAB — TYPE AND SCREEN
ABO/RH(D): O POS
Antibody Screen: NEGATIVE
Unit division: 0

## 2024-08-29 LAB — LD, BODY FLUID (OTHER): LD, Body Fluid: 216 IU/L

## 2024-08-29 LAB — TRIGLYCERIDES: Triglycerides: 73 mg/dL

## 2024-08-29 LAB — GLUCOSE, CAPILLARY: Glucose-Capillary: 112 mg/dL — ABNORMAL HIGH (ref 70–99)

## 2024-08-29 LAB — MAGNESIUM: Magnesium: 2 mg/dL (ref 1.7–2.4)

## 2024-08-29 LAB — PHOSPHORUS: Phosphorus: 3 mg/dL (ref 2.5–4.6)

## 2024-08-29 MED ORDER — HYDROMORPHONE HCL 1 MG/ML IJ SOLN
1.0000 mg | INTRAMUSCULAR | Status: DC | PRN
  Administered 2024-08-29 – 2024-08-30 (×7): 1 mg via INTRAVENOUS
  Filled 2024-08-29 (×7): qty 1

## 2024-08-29 MED ORDER — HYDROMORPHONE HCL 1 MG/ML IJ SOLN
1.0000 mg | INTRAMUSCULAR | Status: DC | PRN
  Administered 2024-08-29: 1 mg via INTRAVENOUS
  Filled 2024-08-29: qty 1

## 2024-08-29 MED ORDER — DEXTROSE IN LACTATED RINGERS 5 % IV SOLN: INTRAVENOUS | Status: DC

## 2024-08-29 MED ORDER — TRAZODONE HCL 50 MG PO TABS: 50.0000 mg | ORAL_TABLET | Freq: Every evening | ORAL | Status: DC | PRN

## 2024-08-29 MED ORDER — DIPHENHYDRAMINE HCL 50 MG/ML IJ SOLN
25.0000 mg | Freq: Once | INTRAMUSCULAR | Status: AC
  Administered 2024-08-29: 25 mg via INTRAVENOUS
  Filled 2024-08-29: qty 1

## 2024-08-29 MED ORDER — SODIUM CHLORIDE 0.9 % IV SOLN
100.0000 mg | INTRAVENOUS | Status: AC
  Administered 2024-08-29 – 2024-09-16 (×19): 100 mg via INTRAVENOUS
  Filled 2024-08-29 (×21): qty 5

## 2024-08-29 NOTE — ED Notes (Signed)
 Oral temp 101.56F, surgeon at bedside. Reports she will be ordering for patient to remain NPO. Patient verbalizes understanding. Abx received from pharmacy and infusion started.

## 2024-08-29 NOTE — Consult Note (Signed)
 "       Date of Admission:  08/27/2024          Reason for Consult: liver abscess secondary to gastric perforation and     Referring Provider: Alm Apo, MD   Assessment:  Liver abscess secondary to gastric perforation due to gastric ulcer Right sided pleural effusion related same perforation with tracking thru the diaphragm Substance abuse including alcohol Melena Esophageal candidiasis  Plan:  Start micafungin  and continue Zosyn  DC vancomycin  Follow-up culture data GI and General Surgery seeing Standard universal precautions     HPI: Jennifer [name] is a 64 year old woman with a history of polysubstance abuse including alcohol bipolar disorder with GI bleed and admission on 3 January status post EGD showing esophageal plaques consistent with candidiasis and a nonbleeding gastric ulcer.  She apparently left AGAINST MEDICAL ADVICE prior to completing treatment.  She was admitted she presented the ER with melena and weakness.  She was also found to be hypotensive requiring volume resuscitation initiation of antibiotics.   He had a CT abdomen pelvis performed which showed a large right hepatic subcapsular abscess with a fistulous communication to the gastric antrum gastric antral wall thickening with surrounding inflammation and a moderate size right pleural effusion with atelectasis of the right lower lobe.  Patient went under went successful ultrasound-guided thoracentesis yielding 900 mL of pleural fluid.  Fluid had 2067 1 nucleated cells and 77% lymphocytes protein of 3.9 LDH of 216.  Pleural fluid is without growth  She also underwent CT-guided placement of a 10 French drain into the right upper quadrant with aspiration of 100 and Filipe 50 mL of purulent material.  The abscess is showing abundant gram-positive cocci moderate gram-negative rods and abundant gram-positive rods as well as a rare yeast and rare lactobacillus species.  The patient has been on  vancomycin  and Zosyn  follows fluconazole  we have added micafungin  and discontinue the vancomycin .  Continue follow-up cultures and adjust antibiotics given the fistulous connection between her perforated stomach and the abscess this will be recurrent problem less the fistula can be addressed.   I personally spent a total of 80 minutes in the care of the patient today including preparing to see the patient, getting/reviewing separately obtained history, performing a medically appropriate exam/evaluation, counseling and educating, placing orders, referring and communicating with other health care professionals, documenting clinical information in the EHR, independently interpreting results, communicating results, and coordinating care.   Evaluation of the patient requires complex antimicrobial therapy evaluation, counseling , isolation needs to reduce disease transmission and risk assessment and mitigation.     Review of Systems: Review of Systems  Constitutional:  Positive for malaise/fatigue. Negative for chills, fever and weight loss.  HENT:  Negative for congestion and sore throat.   Eyes:  Negative for blurred vision and photophobia.  Respiratory:  Negative for cough, shortness of breath and wheezing.   Cardiovascular:  Negative for chest pain, palpitations and leg swelling.  Gastrointestinal:  Positive for abdominal pain, melena and nausea. Negative for blood in stool, constipation, diarrhea, heartburn and vomiting.  Genitourinary:  Negative for dysuria, flank pain and hematuria.  Musculoskeletal:  Negative for back pain, falls, joint pain and myalgias.  Skin:  Negative for itching and rash.  Neurological:  Positive for weakness. Negative for dizziness, focal weakness, loss of consciousness and headaches.  Endo/Heme/Allergies:  Does not bruise/bleed easily.  Psychiatric/Behavioral:  Negative for depression and suicidal ideas. The patient does not have insomnia.     Past Medical  History:  Diagnosis Date   bipolar    Bipolar 1 disorder (HCC)    Depression    Fibroids    now s/p hysterectomy   History of alcohol use 08/27/2024   Hypertension    Left ventricular hypertrophy    Polysubstance abuse (HCC)    IV drug us , cocaine on occassion, smoking and alcoholism, has been going to AA, not completely sober    Social History[1]  Family History  Problem Relation Age of Onset   Hypertension Mother    Hypertension Sister    Diabetes Father    Allergies[2]  OBJECTIVE: Blood pressure (!) 108/52, pulse 79, temperature (!) 101.1 F (38.4 C), temperature source Oral, resp. rate (!) 26, height 5' 4 (1.626 m), weight 81.6 kg, last menstrual period 12/04/2009, SpO2 94%.  Physical Exam Constitutional:      General: She is not in acute distress.    Appearance: Normal appearance. She is well-developed. She is not ill-appearing or diaphoretic.  HENT:     Head: Normocephalic and atraumatic.     Right Ear: Hearing and external ear normal.     Left Ear: Hearing and external ear normal.     Nose: No nasal deformity or rhinorrhea.  Eyes:     General: No scleral icterus.    Conjunctiva/sclera: Conjunctivae normal.     Right eye: Right conjunctiva is not injected.     Left eye: Left conjunctiva is not injected.     Pupils: Pupils are equal, round, and reactive to light.  Neck:     Vascular: No JVD.  Cardiovascular:     Rate and Rhythm: Normal rate and regular rhythm.     Heart sounds: S1 normal and S2 normal.  Pulmonary:     Effort: Pulmonary effort is normal. No respiratory distress.  Abdominal:     General: There is no distension.     Palpations: Abdomen is soft.  Musculoskeletal:        General: Normal range of motion.     Right shoulder: Normal.     Left shoulder: Normal.     Cervical back: Normal range of motion and neck supple.     Right hip: Normal.     Left hip: Normal.     Right knee: Normal.     Left knee: Normal.  Lymphadenopathy:     Head:      Right side of head: No submandibular, preauricular or posterior auricular adenopathy.     Left side of head: No submandibular, preauricular or posterior auricular adenopathy.     Cervical: No cervical adenopathy.     Right cervical: No superficial or deep cervical adenopathy.    Left cervical: No superficial or deep cervical adenopathy.  Skin:    General: Skin is warm and dry.     Coloration: Skin is not pale.     Findings: No abrasion, bruising, ecchymosis, erythema, lesion or rash.     Nails: There is no clubbing.  Neurological:     Mental Status: She is alert and oriented to person, place, and time.     Sensory: No sensory deficit.     Coordination: Coordination normal.     Gait: Gait normal.  Psychiatric:        Attention and Perception: Attention normal. She is attentive.        Mood and Affect: Mood is anxious.        Speech: Speech normal.        Behavior: Behavior normal. Behavior is  cooperative.        Thought Content: Thought content normal.        Judgment: Judgment normal.     Lab Results Lab Results  Component Value Date   WBC 8.1 08/29/2024   HGB 9.3 (L) 08/29/2024   HCT 28.5 (L) 08/29/2024   MCV 89.3 08/29/2024   PLT 601 (H) 08/29/2024    Lab Results  Component Value Date   CREATININE 0.75 08/29/2024   BUN 12 08/29/2024   NA 138 08/29/2024   K 3.8 08/29/2024   CL 105 08/29/2024   CO2 20 (L) 08/29/2024    Lab Results  Component Value Date   ALT 42 08/28/2024   AST 32 08/28/2024   ALKPHOS 129 (H) 08/28/2024   BILITOT 0.3 08/28/2024     Microbiology: Recent Results (from the past 240 hours)  Culture, blood (Routine X 2) w Reflex to ID Panel     Status: None (Preliminary result)   Collection Time: 08/28/24  1:09 AM   Specimen: BLOOD  Result Value Ref Range Status   Specimen Description BLOOD SITE NOT SPECIFIED  Final   Special Requests   Final    BOTTLES DRAWN AEROBIC AND ANAEROBIC Blood Culture results may not be optimal due to an inadequate  volume of blood received in culture bottles   Culture   Final    NO GROWTH 1 DAY Performed at [facility], [address], [place], [place] [address]    Report Status PENDING  Incomplete  Body fluid culture w Gram Stain     Status: None (Preliminary result)   Collection Time: 08/28/24  7:44 AM   Specimen: Pleura; Body Fluid  Result Value Ref Range Status   Specimen Description PLEURAL  Final   Special Requests NONE  Final   Gram Stain RARE WBC SEEN NO ORGANISMS SEEN   Final   Culture   Final    NO GROWTH < 24 HOURS Performed at [facility], [address] [place], [place] [address]    Report Status PENDING  Incomplete  Culture, blood (Routine X 2) w Reflex to ID Panel     Status: None (Preliminary result)   Collection Time: 08/28/24 12:41 PM   Specimen: BLOOD RIGHT HAND  Result Value Ref Range Status   Specimen Description BLOOD RIGHT HAND  Final   Special Requests   Final    BOTTLES DRAWN AEROBIC ONLY Blood Culture results may not be optimal due to an inadequate volume of blood received in culture bottles   Culture   Final    NO GROWTH < 24 HOURS Performed at [facility], [address] [place], [place] [address]    Report Status PENDING  Incomplete  Aerobic/Anaerobic Culture w Gram Stain (surgical/deep wound)     Status: None (Preliminary result)   Collection Time: 08/28/24  3:57 PM   Specimen: Abscess  Result Value Ref Range Status   Specimen Description ABSCESS  Final   Special Requests ABDOMEN  Final   Gram Stain   Final    MODERATE WBC PRESENT, PREDOMINANTLY PMN ABUNDANT GRAM POSITIVE COCCI MODERATE GRAM NEGATIVE RODS ABUNDANT GRAM POSITIVE RODS RARE YEAST    Culture   Final    RARE LACTOBACILLUS SPECIES Standardized susceptibility testing for this organism is not available. Performed at [facility], [address], [place], [place] [address]    Report Status PENDING  Incomplete    Jomarie Fleeta Rothman, MD [facility] [phone] pager  08/29/2024, 2:38 PM      [1]  Social History Tobacco Use   Smoking status: Every Day    Current packs/day: 1.00    Average packs/day: 1 pack/day for 20.0 years (20.0 ttl pk-yrs)    Types: Cigarettes   Smokeless tobacco: Never   Tobacco comments:    Wants to quit.   Vaping Use   Vaping status: Never Used  Substance Use Topics   Alcohol use: Yes    Comment: occ.   Drug use: Not Currently    Types: Cocaine, Marijuana  [2] No Known Allergies  "

## 2024-08-29 NOTE — ED Notes (Signed)
 Patient sitting up, eating from breakfast tray. Respirations even and unlabored.

## 2024-08-29 NOTE — Progress Notes (Signed)
 " Progress Note    Jennifer [name]   FMW:[id number]  DOB: 12-03-1960  DOA: 08/27/2024     2 PCP: Delbert Clam, MD  Initial CC: abd pain  Hospital Course: Jennifer [name] is a 64 year old female with PMH HTN, polysubstance abuse, bipolar 1 disorder, GI bleed (recent admission 1/3 for melena s/p EGD showing esophageal plaques suspicious for candidiasis, nonbleeding gastric ulcer and gastritis).  Patient left AMA before completion of treatment.  Also PMH of HTN, tobacco use, thrombocytopenia, deconditioning/debility, morbid obesity. She presented with melanotic stools and generalized weakness. Hgb 8.9 g/dL on workup down from approximately 12 g/dL during last hospitalization. CT A/P showed large right hepatic subcapsular abscess with fistulous communication to the gastric antrum.  Gastric antral wall thickening with mild surrounding inflammation and new moderate right sized pleural effusion with compressive atelectasis.  She was also found to be hypotensive with SBP in the 80s.  She was started on antibiotics and GI again consulted on admission along with IR. She underwent CT-guided right perihepatic abscess drain placement on 08/28/2024 along with right thoracentesis as well.  Interval History:   She became irate and upset yesterday over not being allowed a diet and threatened AMA.  Compromise was to allow clear liquids.  She is tolerating well with no issues at this time.  She tried asking for more advancement today and I again explicitly told her no until further evaluation with surgery and advanced by them. Otherwise doing okay.  Does have ongoing abdominal pain as expected.  Assessment and Plan: * GI bleed - melanotic stools; GI was again consulted on admission; likely supportive care given known prior EGD results - continue PPI - trend cbc - She was given 1 unit PRBC on admission on 08/28/2024 - GI aware  Hepatic abscess - CT showing large right hepatic subcapsular abscess with  fistulous communication to the gastric antrum - Evaluated by general surgery as well, no indication for surgical intervention at this time - surgery recommending TPN to allow stomach to heal some; has been complicated as patient already threatening AMA if not allowed to eat; after discussion with surgery we're 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  She is amenable with this at this time.  If any clinical worsening, we will need to back off on diet once again - Continue vancomycin  and Zosyn  - IR able to aspirate and place right perihepatic drain on 08/28/2024.  150 cc purulent fluid aspirated -Culture currently polymicrobial including yeast.  Will ask for ID assistance.  Micafungin  also started on 08/29/2024  History of GI bleed - Underwent EGD on 08/13/2024. Found to have mid/distal esophageal candidiasis. 1 non bleeding cratered gastric ulcer. Erythema and congestion in duodenal bulb - was recommended for 2 week fluconazole  (course complete) and BID PPI then GI followup in 6-8 weeks  - Continue trending hemoglobin.  GI aware and following as neccessary  Pleural effusion on right - Moderate size right pleural effusion noted with compressive atelectasis - Underwent right thoracentesis on 08/28/2024.  900 cc fluid removed.  Exudative by lights criteria - Continue antibiotics as noted above  Candidiasis of esophagus (HCC) - just completed 2 week course of fluconazole ; diagnosed with esophageal candidiasis on 08/13/24  Hypokalemia - Replete as needed  History of alcohol use - States she has quit drinking and low suspicion for withdrawal at this time  Essential hypertension - BP soft currently, no meds  Substance  abuse (HCC) - UDS previously positive on 08/14/24 with benzos, opiaties, cocaine - repeat UDS only positive for opiate which she got in ER    Antimicrobials: Zosyn  08/27/2024 >> current Vancomycin   08/28/2024 >> current  Consultants:  General surgery GI IR ID  Procedures:  08/28/2024: Right thoracentesis 08/28/2024: Right perihepatic drain placement and aspiration  DVT prophylaxis:  SCDs Start: 08/27/24 2352 Place TED hose Start: 08/27/24 2352   Code Status:   Code Status: Full Code  Barriers to discharge: None Therapy evaluation: PT Orders:   PT Follow up Rec:   Disposition Plan: Home Status is: Inpatient  Mobility Assessment (Last 72 Hours)     Mobility Assessment     Row Name 08/28/24 19:19:07           Does the patient have exclusion criteria? No- Perform mobility assessment       What is the highest level of mobility based on the mobility assessment? Level 5 (Ambulates independently) - Balance while walking independently - Complete          Diet: Diet Orders (From admission, onward)     Start     Ordered   08/28/24 1652  Diet clear liquid Room service appropriate? Yes; Fluid consistency: Thin  Diet effective now       Question Answer Comment  Room service appropriate? Yes   Fluid consistency: Thin      08/28/24 1651            Objective: Blood pressure 104/69, pulse 74, temperature 98.9 F (37.2 C), temperature source Oral, resp. rate (!) 25, height 5' 4 (1.626 m), weight 81.6 kg, last menstrual period 12/04/2009, SpO2 97%.  Examination:  Physical Exam Constitutional:      Appearance: Normal appearance.  HENT:     Head: Normocephalic and atraumatic.     Mouth/Throat:     Mouth: Mucous membranes are moist.  Eyes:     Extraocular Movements: Extraocular movements intact.  Cardiovascular:     Rate and Rhythm: Normal rate and regular rhythm.  Pulmonary:     Effort: Pulmonary effort is normal. No respiratory distress.     Breath sounds: Normal breath sounds. No wheezing.  Abdominal:     General: Bowel sounds are normal. There is no distension.     Palpations: Abdomen is soft.     Tenderness: There is abdominal tenderness in the right  upper quadrant.  Musculoskeletal:        General: Normal range of motion.     Cervical back: Normal range of motion and neck supple.  Skin:    General: Skin is warm and dry.  Neurological:     General: No focal deficit present.     Mental Status: She is alert.  Psychiatric:        Mood and Affect: Mood normal.      Data Reviewed: Results for orders placed or performed during the hospital encounter of 08/27/24 (from the past 24 hours)  Body fluid cell count with differential     Status: Abnormal   Collection Time: 08/28/24 11:39 AM  Result Value Ref Range   Fluid Type-FCT PLEURAL FLUID RIGHT    Color, Fluid PINK (A) YELLOW   Appearance, Fluid CLOUDY (A) CLEAR   Total Nucleated Cell Count, Fluid 2,267 (H) 0 - 1,000 cu mm   Neutrophil Count, Fluid 19 0 - 25 %   Lymphs, Fluid 77 %   Monocyte-Macrophage-Serous Fluid 2 (L) 50 - 90 %   Eos, Fluid  2 %   Other Cells, Fluid OTHER CELLS IDENTIFIED AS MESOTHELIAL CELLS %  Protein, body fluid (other)     Status: None   Collection Time: 08/28/24 12:06 PM  Result Value Ref Range   Total Protein, Body Fluid Other 3.9 g/dL   Source of Sample PLEURAL FLUID RIGHT   LD, Body Fluid (other)     Status: None   Collection Time: 08/28/24 12:06 PM  Result Value Ref Range   Source of Sample PLEURAL FLUID RIGHT    LD, Body Fluid 216 IU/L  Glucose, Body Fluid Other     Status: None   Collection Time: 08/28/24 12:07 PM  Result Value Ref Range   Glucose, Body Fluid Other 92 mg/dL   Source of Sample PLEURAL FLUID RIGHT   Culture, blood (Routine X 2) w Reflex to ID Panel     Status: None (Preliminary result)   Collection Time: 08/28/24 12:41 PM   Specimen: BLOOD RIGHT HAND  Result Value Ref Range   Specimen Description BLOOD RIGHT HAND    Special Requests      BOTTLES DRAWN AEROBIC ONLY Blood Culture results may not be optimal due to an inadequate volume of blood received in culture bottles   Culture      NO GROWTH < 24 HOURS Performed at [facility], [address], [place], [place] [address]    Report Status PENDING   Aerobic/Anaerobic Culture w Gram Stain (surgical/deep wound)     Status: None (Preliminary result)   Collection Time: 08/28/24  3:57 PM   Specimen: Abscess  Result Value Ref Range   Specimen Description ABSCESS    Special Requests ABDOMEN    Gram Stain      MODERATE WBC PRESENT, PREDOMINANTLY PMN ABUNDANT GRAM POSITIVE COCCI MODERATE GRAM NEGATIVE RODS ABUNDANT GRAM POSITIVE RODS RARE YEAST    Culture      CULTURE REINCUBATED FOR BETTER GROWTH Performed at [facility], [address] [place], [place] [address]    Report Status PENDING   Triglycerides     Status: None   Collection Time: 08/29/24  3:39 AM  Result Value Ref Range   Triglycerides 73 <150 mg/dL  Basic metabolic panel with GFR     Status: Abnormal   Collection Time: 08/29/24  3:39 AM  Result Value Ref Range   Sodium 138 135 - 145 mmol/L   Potassium 3.8 3.5 - 5.1 mmol/L   Chloride 105 98 - 111 mmol/L   CO2 20 (L) 22 - 32 mmol/L   Glucose, Bld 90 70 - 99 mg/dL   BUN 12 8 - 23 mg/dL   Creatinine, Ser 9.24 0.44 - 1.00 mg/dL   Calcium  8.6 (L) 8.9 - 10.3 mg/dL   GFR, Estimated >39 >39 mL/min   Anion gap 12 5 - 15  CBC with Differential/Platelet     Status: Abnormal   Collection Time: 08/29/24  3:39 AM  Result Value Ref Range   WBC 8.1 4.0 - 10.5 K/uL   RBC 3.19 (L) 3.87 - 5.11 MIL/uL   Hemoglobin 9.3 (L) 12.0 - 15.0 g/dL   HCT 71.4 (L) 63.9 - 53.9 %   MCV 89.3 80.0 - 100.0 fL   MCH 29.2 26.0 - 34.0 pg   MCHC 32.6 30.0 - 36.0 g/dL   RDW 84.4 88.4 - 84.4 %   Platelets 601 (H) 150 - 400 K/uL   nRBC 0.4 (H) 0.0 - 0.2 %   Neutrophils Relative % 71 %  Neutro Abs 5.8 1.7 - 7.7 K/uL   Lymphocytes Relative 17 %   Lymphs Abs 1.4 0.7 - 4.0 K/uL   Monocytes Relative 10 %   Monocytes Absolute 0.8 0.1 - 1.0 K/uL   Eosinophils Relative 1 %   Eosinophils Absolute 0.1 0.0 - 0.5 K/uL   Basophils Relative 0 %   Basophils Absolute  0.0 0.0 - 0.1 K/uL   WBC Morphology See Note    RBC Morphology MORPHOLOGY UNREMARKABLE    Smear Review Normal platelet morphology    Immature Granulocytes 1 %   Abs Immature Granulocytes 0.07 0.00 - 0.07 K/uL  Magnesium      Status: None   Collection Time: 08/29/24  3:39 AM  Result Value Ref Range   Magnesium  2.0 1.7 - 2.4 mg/dL  Phosphorus     Status: None   Collection Time: 08/29/24  3:39 AM  Result Value Ref Range   Phosphorus 3.0 2.5 - 4.6 mg/dL  Blood transfusion report - scanned     Status: None ()   Collection Time: 08/29/24  9:49 AM   Narrative   Ordered by an unspecified provider.  Blood transfusion report - scanned     Status: None   Collection Time: 08/29/24  9:49 AM   Narrative   Ordered by an unspecified provider.    I have reviewed pertinent nursing notes, vitals, labs, and images as necessary. I have ordered labwork to follow up on as indicated.  I have reviewed the last notes from staff over past 24 hours. I have discussed patient's care plan and test results with nursing staff, CM/SW, and other staff as appropriate.  Old records reviewed in assessment of this patient  Time spent: Greater than 50% of the 55 minute visit was spent in counseling/coordination of care for the patient as laid out in the A&P.   LOS: 2 days   Alm Apo, MD [facility] 08/29/2024, 10:42 AM "

## 2024-08-29 NOTE — Progress Notes (Addendum)
 "      Subjective: Reports pain around her drain site this morning but otherwise denies pain. Febrile to 38.4. WBC remains normal.   Objective: Vital signs in last 24 hours: Temp:  [98.4 F (36.9 C)-101.1 F (38.4 C)] 101.1 F (38.4 C) (01/19 1115) Pulse Rate:  [68-74] 74 (01/19 0800) Resp:  [18-33] 21 (01/19 1120) BP: (89-119)/(46-88) 119/65 (01/19 1120) SpO2:  [89 %-98 %] 97 % (01/19 0800)    Intake/Output from previous day: 01/18 0701 - 01/19 0700 In: 2285.4 [I.V.:1809.4; IV Piggyback:475.9] Out: 420 [Drains:420] Intake/Output this shift: No intake/output data recorded.  PE: General: resting comfortably, NAD Neuro: alert and oriented, no focal deficits Resp: normal work of breathing on room air CV: RRR Abdomen: soft, nondistended, nontender to palpation. RUQ drain with turbid brown fluid in bulb. Extremities: warm and well-perfused   Lab Results:  Recent Labs    08/28/24 0500 08/29/24 0339  WBC 9.8 8.1  HGB 10.2* 9.3*  HCT 32.4* 28.5*  PLT 647* 601*   BMET Recent Labs    08/28/24 0500 08/29/24 0339  NA 139 138  K 3.8 3.8  CL 107 105  CO2 21* 20*  GLUCOSE 78 90  BUN 14 12  CREATININE 0.76 0.75  CALCIUM  8.8* 8.6*   PT/INR Recent Labs    08/28/24 0848  LABPROT 15.7*  INR 1.2   CMP     Component Value Date/Time   NA 138 08/29/2024 0339   NA 141 05/24/2024 1310   K 3.8 08/29/2024 0339   CL 105 08/29/2024 0339   CO2 20 (L) 08/29/2024 0339   GLUCOSE 90 08/29/2024 0339   BUN 12 08/29/2024 0339   BUN 19 05/24/2024 1310   CREATININE 0.75 08/29/2024 0339   CREATININE 1.15 (H) 09/30/2016 1017   CALCIUM  8.6 (L) 08/29/2024 0339   PROT 7.0 08/28/2024 0500   PROT 7.2 05/24/2024 1310   ALBUMIN 2.5 (L) 08/28/2024 0500   ALBUMIN 4.3 05/24/2024 1310   AST 32 08/28/2024 0500   ALT 42 08/28/2024 0500   ALKPHOS 129 (H) 08/28/2024 0500   BILITOT 0.3 08/28/2024 0500   BILITOT 0.2 05/24/2024 1310   GFRNONAA >60 08/29/2024 0339   GFRNONAA 54 (L)  09/30/2016 1017   GFRAA >60 03/28/2019 1205   GFRAA 63 09/30/2016 1017   Lipase     Component Value Date/Time   LIPASE 49 08/27/2024 1821       Studies/Results: CT GUIDED VISCERAL FLUID DRAIN BY PERC CATH Result Date: 08/28/2024 INDICATION: 64 year old female with history of perforated gastric ulcer and perihepatic abscess formation. EXAM: CT IMAGE GUIDED DRAINAGE BY PERCUTANEOUS CATHETER COMPARISON:  08/27/2024 MEDICATIONS: The patient is currently admitted to the hospital and receiving intravenous antibiotics. The antibiotics were administered within an appropriate time frame prior to the initiation of the procedure. ANESTHESIA/SEDATION: Moderate (conscious) sedation was employed during this procedure. A total of Versed  2 mg and Fentanyl  100 mcg was administered intravenously. Moderate Sedation Time: 15 minutes. The patient's level of consciousness and vital signs were monitored continuously by radiology nursing throughout the procedure under my direct supervision. CONTRAST:  None COMPLICATIONS: None immediate. PROCEDURE: RADIATION DOSE REDUCTION: This exam was performed according to the departmental dose-optimization program which includes automated exposure control, adjustment of the mA and/or kV according to patient size and/or use of iterative reconstruction technique. Informed written consent was obtained from the patient after a discussion of the risks, benefits and alternatives to treatment. The patient was placed supine on the CT gantry and  a pre procedural CT was performed re-demonstrating the known abscess/fluid collection within the right upper quadrant. The procedure was planned. A timeout was performed prior to the initiation of the procedure. The right upper quadrant was prepped and draped in the usual sterile fashion. The overlying soft tissues were anesthetized with 1% lidocaine  with epinephrine . Appropriate trajectory was planned with the use of a 22 gauge spinal needle. An 18 gauge  trocar needle was advanced into the abscess/fluid collection and a short Amplatz super stiff wire was coiled within the collection. Appropriate positioning was confirmed with a limited CT scan. The tract was serially dilated allowing placement of a 10 French all-purpose drainage catheter. Appropriate positioning was confirmed with a limited postprocedural CT scan. Approximately 150 ml of purulent fluid was aspirated. The tube was connected to a bulb suction and sutured in place. A dressing was placed. The patient tolerated the procedure well without immediate post procedural complication. IMPRESSION: Successful CT guided placement of a 10 French all purpose drain catheter into the right upper quadrant perihepatic abscess with aspiration of 150 mL of purulent fluid. Samples were sent to the laboratory as requested by the ordering clinical team. Ester Sides, MD [facility] Radiology Electronically Signed   By: Ester Sides M.D.   On: 08/28/2024 18:52   US  THORACENTESIS RIGHT ASP PLEURAL SPACE W/IMG GUIDE Result Date: 08/28/2024 INDICATION: 64 year old female with perforated gastric ulcer, hepatic abscess and right pleural effusion. Request for therapeutic and diagnostic thoracentesis. EXAM: ULTRASOUND GUIDED RIGHT THORACENTESIS MEDICATIONS: 10 mL 1% lidocaine  COMPLICATIONS: None immediate. PROCEDURE: An ultrasound guided thoracentesis was thoroughly discussed with the patient and questions answered. The benefits, risks, alternatives and complications were also discussed. The patient understands and wishes to proceed with the procedure. Written consent was obtained. Ultrasound was performed to localize and mark an adequate pocket of fluid in the right chest. The area was then prepped and draped in the normal sterile fashion. 1% Lidocaine  was used for local anesthesia. Under ultrasound guidance a 6 Fr Safe-T-Centesis catheter was introduced. Thoracentesis was  performed. The catheter was removed and a dressing applied. FINDINGS: A total of approximately 900 mL of hazy amber fluid was removed. Samples were sent to the laboratory as requested by the clinical team. Post procedure chest X-ray reviewed, negative for pneumothorax. IMPRESSION: Successful ultrasound guided right thoracentesis yielding 900 mL of pleural fluid. Performed by: Aimee Han, PA-C. Electronically Signed   By: Ester Sides M.D.   On: 08/28/2024 13:57   DG Chest 1 View Result Date: 08/28/2024 EXAM: 1 VIEW(S) XRAY OF THE CHEST 08/28/2024 10:59:00 AM COMPARISON: 08/13/2024 CLINICAL HISTORY: Pleural effusion on right. FINDINGS: LUNGS AND PLEURA: Low lung volumes with progressive basilar atelectasis. Confluent airspace opacities at right lung base. Small right pleural effusion. No pneumothorax. HEART AND MEDIASTINUM: Aortic atherosclerosis. No acute abnormality of the cardiac and mediastinal silhouettes. BONES AND SOFT TISSUES: No acute osseous abnormality. IMPRESSION: 1. Small right pleural effusion. 2. Low lung volumes with progressive basilar atelectasis and confluent right basilar airspace opacities. 3. Aortic atherosclerosis. Electronically signed by: Evalene Coho MD 08/28/2024 11:16 AM EST RP Workstation: [id number]   CT ABDOMEN PELVIS W CONTRAST Result Date: 08/27/2024 EXAM: CT ABDOMEN AND PELVIS WITH CONTRAST 08/27/2024 09:02:43 PM TECHNIQUE: CT of the abdomen and pelvis was performed with the administration of intravenous contrast. Multiplanar reformatted images are provided for review. Automated exposure control, iterative reconstruction, and/or weight-based adjustment of the mA/kV was utilized to reduce the radiation dose to as low  as reasonably achievable. COMPARISON: CT chest abdomen and pelvis 08/13/2024. CLINICAL HISTORY: Epigastric pain. FINDINGS: LOWER CHEST: There is a new moderate sized right pleural effusion with compressive atelectasis of the right lower lobe. There is new right  middle lobe airspace consolidation with air bronchograms. LIVER: There is a new enhancing lateral right hepatic subcapsular fluid collection containing air measuring 12.3 x 3.8 x 16.1 cm. This abuts the anterior abdominal wall. GALLBLADDER AND BILE DUCTS: Gallbladder is unremarkable. No biliary ductal dilatation. SPLEEN: No acute abnormality. PANCREAS: No acute abnormality. ADRENAL GLANDS: No acute abnormality. KIDNEYS, URETERS AND BLADDER: There is a 3 mm calculus in the inferior pole of the right kidney. No hydronephrosis. No perinephric or periureteral stranding. Urinary bladder is unremarkable. GI AND BOWEL: Air fluid tract is seen extending from the enhancing lateral right hepatic subcapsular fluid collection to the anterior superior gastric antrum best seen on coronal images 6/70-74. There is fluid distention of the distal esophagus, unchanged. There is wall thickening of the gastric antrum as seen on prior. There is mild inflammation surrounding the gastric antrum. The appendix appears normal. There is no bowel obstruction. PERITONEUM AND RETROPERITONEUM: No ascites. No free air. VASCULATURE: Aorta is normal in caliber. Atherosclerotic calcifications of the aorta and iliac arteries. LYMPH NODES: No lymphadenopathy. REPRODUCTIVE ORGANS: Uterus is not seen. BONES AND SOFT TISSUES: No acute osseous abnormality. There is scarring in the anterior abdominal wall. IMPRESSION: 1. Large right hepatic subcapsular abscess with fistulous communication to the gastric antrum. 2. Gastric antral wall thickening with mild surrounding inflammation, similar to prior. 3. New moderate sized right pleural effusion with compressive atelectasis of the right lower lobe and new right middle lobe airspace consolidation. Electronically signed by: Greig Pique MD 08/27/2024 09:25 PM EST RP Workstation: [id number]    Anti-infectives: Anti-infectives (From admission, onward)    Start     Dose/Rate Route Frequency Ordered Stop    08/29/24 0815  micafungin  (MYCAMINE ) 100 mg in sodium chloride  0.9 % 100 mL IVPB        100 mg 105 mL/hr over 1 Hours Intravenous Every 24 hours 08/29/24 0807     08/28/24 1200  vancomycin  (VANCOREADY) IVPB 750 mg/150 mL  Status:  Discontinued        750 mg 150 mL/hr over 60 Minutes Intravenous Every 12 hours 08/27/24 2352 08/29/24 0926   08/28/24 1000  fluconazole  (DIFLUCAN ) tablet 400 mg        400 mg Oral Daily 08/27/24 2351 08/28/24 0928   08/28/24 0400  piperacillin -tazobactam (ZOSYN ) IVPB 3.375 g        3.375 g 12.5 mL/hr over 240 Minutes Intravenous Every 8 hours 08/27/24 2352     08/27/24 2345  vancomycin  (VANCOREADY) IVPB 1750 mg/350 mL        1,750 mg 175 mL/hr over 120 Minutes Intravenous  Once 08/27/24 2343 08/28/24 0706   08/27/24 2245  piperacillin -tazobactam (ZOSYN ) IVPB 3.375 g        3.375 g 100 mL/hr over 30 Minutes Intravenous  Once 08/27/24 2239 08/27/24 2325        Assessment/Plan 64 yo female with a known gastric antral ulcer, presenting with acute abdominal pain and a large upper abdominal abscess, consistent with a contained perforation. S/p percutaneous drain placement 1/18. - Patient has a fever this morning but is otherwise clinical stable with a benign abdominal exam - Continue nonoperative management. I counseled patient that she needs to remain strictly NPO for now. - Continue IV PPI and antibiotics - H  pylori stool antigen - Plan for upper GI in a few days to assess for ongoing leak - If patient clinically deteriorates, will need surgical exploration     LOS: 2 days    Leonor Dawn, MD [facility] Surgery General, Hepatobiliary and Pancreatic Surgery 08/29/24 11:27 AM  "

## 2024-08-29 NOTE — Progress Notes (Signed)
 [facility] Progress Note  SUBJECTIVE:   Interval history: Jennifer [name] was seen and evaluated today at bedside. Resting comfortably in bed. Now status post thoracentesis and CT guided aspiration of abdominal fluid collection. General Surgery following, recommending NPO. ID following. Patient noted that her abdominal pain is improved, she is having less pressure in the region. She is hungry, no nausea or vomiting. No chest pain or shortness of breath. She has not had further melena since prior to arrival at hospital.  Past Medical History:  Diagnosis Date   bipolar    Bipolar 1 disorder (HCC)    Depression    Fibroids    now s/p hysterectomy   History of alcohol use 08/27/2024   Hypertension    Left ventricular hypertrophy    Polysubstance abuse (HCC)    IV drug us , cocaine on occassion, smoking and alcoholism, has been going to AA, not completely sober   Past Surgical History:  Procedure Laterality Date   ABDOMINAL HYSTERECTOMY     2003   BILATERAL SALPINGOOPHORECTOMY     01/2010   BONE BIOPSY  08/13/2024   Procedure: BIOPSY, GI;  Surgeon: Dianna Specking, MD;  Location: [facility];  Service: Gastroenterology;;   DILATION AND CURETTAGE OF UTERUS     ESOPHAGOGASTRODUODENOSCOPY N/A 08/13/2024   Procedure: EGD (ESOPHAGOGASTRODUODENOSCOPY);  Surgeon: Dianna Specking, MD;  Location: [facility];  Service: Gastroenterology;  Laterality: N/A;   EXPLORATORY LAPAROTOMY     complex pelvic mass 2011   TONSILLECTOMY     Current Facility-Administered Medications  Medication Dose Route Frequency Provider Last Rate Last Admin   0.9 %  sodium chloride  infusion (Manually program via Guardrails IV Fluids)   Intravenous Once Sundil, Subrina, MD   Held at 08/28/24 0147   acetaminophen  (TYLENOL ) tablet 650 mg  650 mg Oral Q6H PRN Sundil, Subrina, MD   650 mg at 08/28/24 1954   Or   acetaminophen  (TYLENOL ) suppository 650 mg  650 mg Rectal Q6H PRN Sundil, Subrina, MD   650 mg at  08/29/24 1150   dextrose  5 % in lactated ringers  infusion   Intravenous Continuous Patsy Lenis, MD 75 mL/hr at 08/29/24 1527 New Bag at 08/29/24 1527   HYDROmorphone  (DILAUDID ) injection 1 mg  1 mg Intravenous Q4H PRN Patsy Lenis, MD   1 mg at 08/29/24 1234   micafungin  (MYCAMINE ) 100 mg in sodium chloride  0.9 % 100 mL IVPB  100 mg Intravenous Q24H Patsy Lenis, MD   Stopped at 08/29/24 1250   ondansetron  (ZOFRAN ) injection 4 mg  4 mg Intravenous Once Sundil, Subrina, MD       pantoprazole  (PROTONIX ) injection 40 mg  40 mg Intravenous Q12H Sundil, Subrina, MD   40 mg at 08/29/24 1013   piperacillin -tazobactam (ZOSYN ) IVPB 3.375 g  3.375 g Intravenous Q8H Mattie Marvetta SQUIBB, RPH   Stopped at 08/29/24 1531   sodium chloride  flush (NS) 0.9 % injection 3 mL  3 mL Intravenous Q12H Sundil, Subrina, MD   3 mL at 08/29/24 1017   sodium chloride  flush (NS) 0.9 % injection 3 mL  3 mL Intravenous Q12H Sundil, Subrina, MD   3 mL at 08/29/24 1017   sodium chloride  flush (NS) 0.9 % injection 3 mL  3 mL Intravenous PRN Sundil, Subrina, MD       sodium chloride  flush (NS) 0.9 % injection 5 mL  5 mL Intracatheter Q8H Suttle, Dylan J, MD   5 mL at 08/29/24 1500   Allergies as of 08/27/2024   (  No Known Allergies)   Review of Systems:  Review of Systems  Respiratory:  Negative for shortness of breath.   Cardiovascular:  Negative for chest pain.  Gastrointestinal:  Positive for abdominal pain. Negative for nausea and vomiting.    OBJECTIVE:   Temp:  [98.4 F (36.9 C)-101.1 F (38.4 C)] 101.1 F (38.4 C) (01/19 1115) Pulse Rate:  [69-79] 79 (01/19 1316) Resp:  [14-29] 26 (01/19 1316) BP: (89-122)/(46-79) 108/52 (01/19 1316) SpO2:  [89 %-97 %] 94 % (01/19 1316)   Physical Exam Constitutional:      General: She is not in acute distress.    Appearance: She is not ill-appearing, toxic-appearing or diaphoretic.  Cardiovascular:     Rate and Rhythm: Normal rate and regular rhythm.  Pulmonary:      Effort: No respiratory distress.     Breath sounds: Normal breath sounds.  Abdominal:     General: Bowel sounds are normal. There is no distension.     Palpations: Abdomen is soft.     Tenderness: There is abdominal tenderness.     Comments: JP drain appreciated with brown/tan liquid contents  Skin:    General: Skin is warm and dry.  Neurological:     Mental Status: She is alert.     Labs: Recent Labs    08/27/24 1821 08/28/24 0500 08/29/24 0339  WBC 7.8 9.8 8.1  HGB 8.9* 10.2* 9.3*  HCT 27.2* 32.4* 28.5*  PLT 528* 647* 601*   BMET Recent Labs    08/27/24 1821 08/28/24 0500 08/29/24 0339  NA 139 139 138  K 3.1* 3.8 3.8  CL 105 107 105  CO2 22 21* 20*  GLUCOSE 83 78 90  BUN 22 14 12   CREATININE 0.90 0.76 0.75  CALCIUM  8.9 8.8* 8.6*   LFT Recent Labs    08/28/24 0500  PROT 7.0  ALBUMIN 2.5*  AST 32  ALT 42  ALKPHOS 129*  BILITOT 0.3   PT/INR Recent Labs    08/28/24 0848  LABPROT 15.7*  INR 1.2   Diagnostic imaging: CT GUIDED VISCERAL FLUID DRAIN BY PERC CATH Result Date: 08/28/2024 INDICATION: 64 year old female with history of perforated gastric ulcer and perihepatic abscess formation. EXAM: CT IMAGE GUIDED DRAINAGE BY PERCUTANEOUS CATHETER COMPARISON:  08/27/2024 MEDICATIONS: The patient is currently admitted to the hospital and receiving intravenous antibiotics. The antibiotics were administered within an appropriate time frame prior to the initiation of the procedure. ANESTHESIA/SEDATION: Moderate (conscious) sedation was employed during this procedure. A total of Versed  2 mg and Fentanyl  100 mcg was administered intravenously. Moderate Sedation Time: 15 minutes. The patient's level of consciousness and vital signs were monitored continuously by radiology nursing throughout the procedure under my direct supervision. CONTRAST:  None COMPLICATIONS: None immediate. PROCEDURE: RADIATION DOSE REDUCTION: This exam was performed according to the departmental  dose-optimization program which includes automated exposure control, adjustment of the mA and/or kV according to patient size and/or use of iterative reconstruction technique. Informed written consent was obtained from the patient after a discussion of the risks, benefits and alternatives to treatment. The patient was placed supine on the CT gantry and a pre procedural CT was performed re-demonstrating the known abscess/fluid collection within the right upper quadrant. The procedure was planned. A timeout was performed prior to the initiation of the procedure. The right upper quadrant was prepped and draped in the usual sterile fashion. The overlying soft tissues were anesthetized with 1% lidocaine  with epinephrine . Appropriate trajectory was planned with the use of  a 22 gauge spinal needle. An 18 gauge trocar needle was advanced into the abscess/fluid collection and a short Amplatz super stiff wire was coiled within the collection. Appropriate positioning was confirmed with a limited CT scan. The tract was serially dilated allowing placement of a 10 French all-purpose drainage catheter. Appropriate positioning was confirmed with a limited postprocedural CT scan. Approximately 150 ml of purulent fluid was aspirated. The tube was connected to a bulb suction and sutured in place. A dressing was placed. The patient tolerated the procedure well without immediate post procedural complication. IMPRESSION: Successful CT guided placement of a 10 French all purpose drain catheter into the right upper quadrant perihepatic abscess with aspiration of 150 mL of purulent fluid. Samples were sent to the laboratory as requested by the ordering clinical team. Ester Sides, MD [facility] Electronically Signed   By: Ester Sides M.D.   On: 08/28/2024 18:52   US  THORACENTESIS RIGHT ASP PLEURAL SPACE W/IMG GUIDE Result Date: 08/28/2024 INDICATION: 64 year old female with  perforated gastric ulcer, hepatic abscess and right pleural effusion. Request for therapeutic and diagnostic thoracentesis. EXAM: ULTRASOUND GUIDED RIGHT THORACENTESIS MEDICATIONS: 10 mL 1% lidocaine  COMPLICATIONS: None immediate. PROCEDURE: An ultrasound guided thoracentesis was thoroughly discussed with the patient and questions answered. The benefits, risks, alternatives and complications were also discussed. The patient understands and wishes to proceed with the procedure. Written consent was obtained. Ultrasound was performed to localize and mark an adequate pocket of fluid in the right chest. The area was then prepped and draped in the normal sterile fashion. 1% Lidocaine  was used for local anesthesia. Under ultrasound guidance a 6 Fr Safe-T-Centesis catheter was introduced. Thoracentesis was performed. The catheter was removed and a dressing applied. FINDINGS: A total of approximately 900 mL of hazy amber fluid was removed. Samples were sent to the laboratory as requested by the clinical team. Post procedure chest X-ray reviewed, negative for pneumothorax. IMPRESSION: Successful ultrasound guided right thoracentesis yielding 900 mL of pleural fluid. Performed by: Aimee Han, PA-C. Electronically Signed   By: Ester Sides M.D.   On: 08/28/2024 13:57   DG Chest 1 View Result Date: 08/28/2024 EXAM: 1 VIEW(S) XRAY OF THE CHEST 08/28/2024 10:59:00 AM COMPARISON: 08/13/2024 CLINICAL HISTORY: Pleural effusion on right. FINDINGS: LUNGS AND PLEURA: Low lung volumes with progressive basilar atelectasis. Confluent airspace opacities at right lung base. Small right pleural effusion. No pneumothorax. HEART AND MEDIASTINUM: Aortic atherosclerosis. No acute abnormality of the cardiac and mediastinal silhouettes. BONES AND SOFT TISSUES: No acute osseous abnormality. IMPRESSION: 1. Small right pleural effusion. 2. Low lung volumes with progressive basilar atelectasis and confluent right basilar airspace opacities. 3.  Aortic atherosclerosis. Electronically signed by: Evalene Coho MD 08/28/2024 11:16 AM EST RP Workstation: [id number]   CT ABDOMEN PELVIS W CONTRAST Result Date: 08/27/2024 EXAM: CT ABDOMEN AND PELVIS WITH CONTRAST 08/27/2024 09:02:43 PM TECHNIQUE: CT of the abdomen and pelvis was performed with the administration of intravenous contrast. Multiplanar reformatted images are provided for review. Automated exposure control, iterative reconstruction, and/or weight-based adjustment of the mA/kV was utilized to reduce the radiation dose to as low as reasonably achievable. COMPARISON: CT chest abdomen and pelvis 08/13/2024. CLINICAL HISTORY: Epigastric pain. FINDINGS: LOWER CHEST: There is a new moderate sized right pleural effusion with compressive atelectasis of the right lower lobe. There is new right middle lobe airspace consolidation with air bronchograms. LIVER: There is a new enhancing lateral right hepatic subcapsular fluid collection containing air measuring 12.3 x 3.8  x 16.1 cm. This abuts the anterior abdominal wall. GALLBLADDER AND BILE DUCTS: Gallbladder is unremarkable. No biliary ductal dilatation. SPLEEN: No acute abnormality. PANCREAS: No acute abnormality. ADRENAL GLANDS: No acute abnormality. KIDNEYS, URETERS AND BLADDER: There is a 3 mm calculus in the inferior pole of the right kidney. No hydronephrosis. No perinephric or periureteral stranding. Urinary bladder is unremarkable. GI AND BOWEL: Air fluid tract is seen extending from the enhancing lateral right hepatic subcapsular fluid collection to the anterior superior gastric antrum best seen on coronal images 6/70-74. There is fluid distention of the distal esophagus, unchanged. There is wall thickening of the gastric antrum as seen on prior. There is mild inflammation surrounding the gastric antrum. The appendix appears normal. There is no bowel obstruction. PERITONEUM AND RETROPERITONEUM: No ascites. No free air. VASCULATURE: Aorta is normal  in caliber. Atherosclerotic calcifications of the aorta and iliac arteries. LYMPH NODES: No lymphadenopathy. REPRODUCTIVE ORGANS: Uterus is not seen. BONES AND SOFT TISSUES: No acute osseous abnormality. There is scarring in the anterior abdominal wall. IMPRESSION: 1. Large right hepatic subcapsular abscess with fistulous communication to the gastric antrum. 2. Gastric antral wall thickening with mild surrounding inflammation, similar to prior. 3. New moderate sized right pleural effusion with compressive atelectasis of the right lower lobe and new right middle lobe airspace consolidation. Electronically signed by: Greig Pique MD 08/27/2024 09:25 PM EST RP Workstation: [id number]   IMPRESSION: Perforated gastric antrum ulcer with fistulous connection to hepatic abscess   -Status post CT guided drainage 08/28/24  Abdominal pain secondary to above, improved Right sided pleural effusion status post thoracentesis  Melena Acute blood loss anemia NSAID use History polysubstance use  PLAN: - General Surgery recommendations, NPO, discussed this at length with patient  - Trend H/H, transfuse for Hgb < 7  - IV PPI Q12Hr - Monitor bowel movements  - [facility] will follow   LOS: 2 days   [name], [facility]

## 2024-08-29 NOTE — ED Notes (Signed)
 Rectal tylenol  administered. 50mL removed from drain at patient request.

## 2024-08-29 NOTE — Progress Notes (Signed)
 "   Referring Physician(s): Dr. Valentine  Supervising Physician: Luverne Aran  Patient Status:  [facility] - In-pt  Chief Complaint: Hepatic abscess  Subjective: Sitting up in bed.   Complains of pain, pain med just given few minutes prior to visit.  States she is hungry.   Allergies: Patient has no known allergies.  Medications: Prior to Admission medications  Medication Sig Start Date End Date Taking? Authorizing Provider  [Paused] amLODipine  (NORVASC ) 10 MG tablet TAKE 1 TABLET (10 MG TOTAL) BY MOUTH DAILY (Office visit for future refills) Wait to take this until your doctor or other care provider tells you to start again. 07/14/24   Newlin, Enobong, MD  atorvastatin  (LIPITOR) 10 MG tablet Take 1 tablet (10 mg total) by mouth daily. Patient not taking: Reported on 08/28/2024 07/14/24   Newlin, Enobong, MD  Blood Pressure Monitoring (BLOOD PRESSURE CUFF) MISC 1 Units by Does not apply route daily. 05/24/24   Mayers, Cari S, PA-C  dicyclomine  (BENTYL ) 20 MG tablet Take 1 tablet (20 mg total) by mouth 3 (three) times daily as needed for spasms. Patient not taking: Reported on 08/28/2024 08/22/24 09/21/24  Christobal Guadalajara, MD  folic acid  (FOLVITE ) 1 MG tablet Take 1 tablet (1 mg total) by mouth daily. Patient not taking: Reported on 08/28/2024 08/22/24 09/21/24  Christobal Guadalajara, MD  [Paused] lisinopril -hydrochlorothiazide  (ZESTORETIC ) 20-12.5 MG tablet Take 2 tablets by mouth daily. Wait to take this until your doctor or other care provider tells you to start again. 07/14/24   Newlin, Enobong, MD  metoprolol  succinate (TOPROL -XL) 50 MG 24 hr tablet Take 1 tablet (50 mg total) by mouth daily. Patient not taking: Reported on 08/28/2024 07/14/24   Newlin, Enobong, MD  oxyCODONE -acetaminophen  (PERCOCET/ROXICET) 5-325 MG tablet Take 1 tablet by mouth every 6 (six) hours as needed for moderate pain (pain score 4-6). Patient not taking: Reported on 08/28/2024 08/22/24   Christobal Guadalajara, MD  pantoprazole  (PROTONIX )  40 MG tablet Take 1 tablet (40 mg total) by mouth 2 (two) times daily. Patient not taking: Reported on 08/28/2024 08/22/24 09/21/24  Christobal Guadalajara, MD  thiamine  (VITAMIN B-1) 100 MG tablet Take 1 tablet (100 mg total) by mouth daily. Patient not taking: Reported on 08/28/2024 08/22/24 09/21/24  Christobal Guadalajara, MD     Vital Signs: BP (!) 108/52 (BP Location: Right Arm)   Pulse 79   Temp (!) 101.1 F (38.4 C) (Oral)   Resp (!) 26   Ht 5' 4 (1.626 m)   Wt 180 lb (81.6 kg)   LMP 12/04/2009   SpO2 94%   BMI 30.90 kg/m   Physical Exam NAD, alert Abdomen: soft, distended, RUQ drain in place.  Insertion site stable.  Skin suture remains in place.   Beige, mixed-density fluid in bulb.    Imaging: CT GUIDED VISCERAL FLUID DRAIN BY PERC CATH Result Date: 08/28/2024 INDICATION: 64 year old female with history of perforated gastric ulcer and perihepatic abscess formation. EXAM: CT IMAGE GUIDED DRAINAGE BY PERCUTANEOUS CATHETER COMPARISON:  08/27/2024 MEDICATIONS: The patient is currently admitted to the hospital and receiving intravenous antibiotics. The antibiotics were administered within an appropriate time frame prior to the initiation of the procedure. ANESTHESIA/SEDATION: Moderate (conscious) sedation was employed during this procedure. A total of Versed  2 mg and Fentanyl  100 mcg was administered intravenously. Moderate Sedation Time: 15 minutes. The patient's level of consciousness and vital signs were monitored continuously by radiology nursing throughout the procedure under my direct supervision. CONTRAST:  None COMPLICATIONS: None immediate. PROCEDURE: RADIATION DOSE  REDUCTION: This exam was performed according to the departmental dose-optimization program which includes automated exposure control, adjustment of the mA and/or kV according to patient size and/or use of iterative reconstruction technique. Informed written consent was obtained from the patient after a discussion of the risks, benefits and  alternatives to treatment. The patient was placed supine on the CT gantry and a pre procedural CT was performed re-demonstrating the known abscess/fluid collection within the right upper quadrant. The procedure was planned. A timeout was performed prior to the initiation of the procedure. The right upper quadrant was prepped and draped in the usual sterile fashion. The overlying soft tissues were anesthetized with 1% lidocaine  with epinephrine . Appropriate trajectory was planned with the use of a 22 gauge spinal needle. An 18 gauge trocar needle was advanced into the abscess/fluid collection and a short Amplatz super stiff wire was coiled within the collection. Appropriate positioning was confirmed with a limited CT scan. The tract was serially dilated allowing placement of a 10 French all-purpose drainage catheter. Appropriate positioning was confirmed with a limited postprocedural CT scan. Approximately 150 ml of purulent fluid was aspirated. The tube was connected to a bulb suction and sutured in place. A dressing was placed. The patient tolerated the procedure well without immediate post procedural complication. IMPRESSION: Successful CT guided placement of a 10 French all purpose drain catheter into the right upper quadrant perihepatic abscess with aspiration of 150 mL of purulent fluid. Samples were sent to the laboratory as requested by the ordering clinical team. Ester Sides, MD [facility] Electronically Signed   By: Ester Sides M.D.   On: 08/28/2024 18:52   US  THORACENTESIS RIGHT ASP PLEURAL SPACE W/IMG GUIDE Result Date: 08/28/2024 INDICATION: 64 year old female with perforated gastric ulcer, hepatic abscess and right pleural effusion. Request for therapeutic and diagnostic thoracentesis. EXAM: ULTRASOUND GUIDED RIGHT THORACENTESIS MEDICATIONS: 10 mL 1% lidocaine  COMPLICATIONS: None immediate. PROCEDURE: An ultrasound guided thoracentesis  was thoroughly discussed with the patient and questions answered. The benefits, risks, alternatives and complications were also discussed. The patient understands and wishes to proceed with the procedure. Written consent was obtained. Ultrasound was performed to localize and mark an adequate pocket of fluid in the right chest. The area was then prepped and draped in the normal sterile fashion. 1% Lidocaine  was used for local anesthesia. Under ultrasound guidance a 6 Fr Safe-T-Centesis catheter was introduced. Thoracentesis was performed. The catheter was removed and a dressing applied. FINDINGS: A total of approximately 900 mL of hazy amber fluid was removed. Samples were sent to the laboratory as requested by the clinical team. Post procedure chest X-ray reviewed, negative for pneumothorax. IMPRESSION: Successful ultrasound guided right thoracentesis yielding 900 mL of pleural fluid. Performed by: Aimee Han, PA-C. Electronically Signed   By: Ester Sides M.D.   On: 08/28/2024 13:57   DG Chest 1 View Result Date: 08/28/2024 EXAM: 1 VIEW(S) XRAY OF THE CHEST 08/28/2024 10:59:00 AM COMPARISON: 08/13/2024 CLINICAL HISTORY: Pleural effusion on right. FINDINGS: LUNGS AND PLEURA: Low lung volumes with progressive basilar atelectasis. Confluent airspace opacities at right lung base. Small right pleural effusion. No pneumothorax. HEART AND MEDIASTINUM: Aortic atherosclerosis. No acute abnormality of the cardiac and mediastinal silhouettes. BONES AND SOFT TISSUES: No acute osseous abnormality. IMPRESSION: 1. Small right pleural effusion. 2. Low lung volumes with progressive basilar atelectasis and confluent right basilar airspace opacities. 3. Aortic atherosclerosis. Electronically signed by: Evalene Coho MD 08/28/2024 11:16 AM EST RP Workstation: [id number]  CT ABDOMEN PELVIS W CONTRAST Result Date: 08/27/2024 EXAM: CT ABDOMEN AND PELVIS WITH CONTRAST 08/27/2024 09:02:43 PM TECHNIQUE: CT of the abdomen and  pelvis was performed with the administration of intravenous contrast. Multiplanar reformatted images are provided for review. Automated exposure control, iterative reconstruction, and/or weight-based adjustment of the mA/kV was utilized to reduce the radiation dose to as low as reasonably achievable. COMPARISON: CT chest abdomen and pelvis 08/13/2024. CLINICAL HISTORY: Epigastric pain. FINDINGS: LOWER CHEST: There is a new moderate sized right pleural effusion with compressive atelectasis of the right lower lobe. There is new right middle lobe airspace consolidation with air bronchograms. LIVER: There is a new enhancing lateral right hepatic subcapsular fluid collection containing air measuring 12.3 x 3.8 x 16.1 cm. This abuts the anterior abdominal wall. GALLBLADDER AND BILE DUCTS: Gallbladder is unremarkable. No biliary ductal dilatation. SPLEEN: No acute abnormality. PANCREAS: No acute abnormality. ADRENAL GLANDS: No acute abnormality. KIDNEYS, URETERS AND BLADDER: There is a 3 mm calculus in the inferior pole of the right kidney. No hydronephrosis. No perinephric or periureteral stranding. Urinary bladder is unremarkable. GI AND BOWEL: Air fluid tract is seen extending from the enhancing lateral right hepatic subcapsular fluid collection to the anterior superior gastric antrum best seen on coronal images 6/70-74. There is fluid distention of the distal esophagus, unchanged. There is wall thickening of the gastric antrum as seen on prior. There is mild inflammation surrounding the gastric antrum. The appendix appears normal. There is no bowel obstruction. PERITONEUM AND RETROPERITONEUM: No ascites. No free air. VASCULATURE: Aorta is normal in caliber. Atherosclerotic calcifications of the aorta and iliac arteries. LYMPH NODES: No lymphadenopathy. REPRODUCTIVE ORGANS: Uterus is not seen. BONES AND SOFT TISSUES: No acute osseous abnormality. There is scarring in the anterior abdominal wall. IMPRESSION: 1. Large  right hepatic subcapsular abscess with fistulous communication to the gastric antrum. 2. Gastric antral wall thickening with mild surrounding inflammation, similar to prior. 3. New moderate sized right pleural effusion with compressive atelectasis of the right lower lobe and new right middle lobe airspace consolidation. Electronically signed by: Greig Pique MD 08/27/2024 09:25 PM EST RP Workstation: [id number]    Labs:  CBC: Recent Labs    08/17/24 0357 08/27/24 1821 08/28/24 0500 08/29/24 0339  WBC 9.6 7.8 9.8 8.1  HGB 10.8* 8.9* 10.2* 9.3*  HCT 33.4* 27.2* 32.4* 28.5*  PLT 141* 528* 647* 601*    COAGS: Recent Labs    08/13/24 2127 08/28/24 0848  INR 1.0 1.2    BMP: Recent Labs    08/17/24 0357 08/27/24 1821 08/28/24 0500 08/29/24 0339  NA 142 139 139 138  K 4.6 3.1* 3.8 3.8  CL 108 105 107 105  CO2 23 22 21* 20*  GLUCOSE 118* 83 78 90  BUN 18 22 14 12   CALCIUM  9.7 8.9 8.8* 8.6*  CREATININE 1.00 0.90 0.76 0.75  GFRNONAA >60 >60 >60 >60    LIVER FUNCTION TESTS: Recent Labs    08/12/24 0814 08/13/24 2025 08/27/24 1821 08/28/24 0500  BILITOT 0.6 0.4 0.3 0.3  AST 21 37 31 32  ALT 17 20 46* 42  ALKPHOS 63 60 128* 129*  PROT 7.8 8.0 6.7 7.0  ALBUMIN 4.3 4.3 2.4* 2.5*    Assessment and Plan: Hepatic abscess s/p drain placement Patient with RUQ in place with thick, beige fluid consistent with abscess.  150 mL documented overnight.  Flushes and aspirates easily.  Followed by ID, on abx.   Plan: Continue TID flushes with 5 cc NS.  Record output Q shift. Dressing changes QD or PRN if soiled.   Discharge planning: Please contact IR APP or on call IR MD prior to patient d/c to ensure appropriate follow up plans are in place.     Electronically Signed: Oria Klimas Sue-Ellen Angelos [place], PA 08/29/2024, 4:02 PM   I spent a total of 15 Minutes at the the patient's bedside AND on the patient's hospital floor or unit, greater than 50% of which was  counseling/coordinating care for hepatic abscess.       "

## 2024-08-30 DIAGNOSIS — K251 Acute gastric ulcer with perforation: Secondary | ICD-10-CM | POA: Diagnosis not present

## 2024-08-30 DIAGNOSIS — Z8719 Personal history of other diseases of the digestive system: Secondary | ICD-10-CM | POA: Diagnosis not present

## 2024-08-30 DIAGNOSIS — K750 Abscess of liver: Secondary | ICD-10-CM | POA: Diagnosis not present

## 2024-08-30 DIAGNOSIS — J90 Pleural effusion, not elsewhere classified: Secondary | ICD-10-CM | POA: Diagnosis not present

## 2024-08-30 DIAGNOSIS — F1010 Alcohol abuse, uncomplicated: Secondary | ICD-10-CM | POA: Diagnosis not present

## 2024-08-30 DIAGNOSIS — K316 Fistula of stomach and duodenum: Secondary | ICD-10-CM | POA: Diagnosis not present

## 2024-08-30 DIAGNOSIS — F1910 Other psychoactive substance abuse, uncomplicated: Secondary | ICD-10-CM | POA: Diagnosis not present

## 2024-08-30 DIAGNOSIS — B3781 Candidal esophagitis: Secondary | ICD-10-CM | POA: Diagnosis not present

## 2024-08-30 LAB — BASIC METABOLIC PANEL WITH GFR
Anion gap: 9 (ref 5–15)
BUN: 7 mg/dL — ABNORMAL LOW (ref 8–23)
CO2: 23 mmol/L (ref 22–32)
Calcium: 8.8 mg/dL — ABNORMAL LOW (ref 8.9–10.3)
Chloride: 106 mmol/L (ref 98–111)
Creatinine, Ser: 0.71 mg/dL (ref 0.44–1.00)
GFR, Estimated: >60 mL/min
Glucose, Bld: 92 mg/dL (ref 70–99)
Potassium: 4.1 mmol/L (ref 3.5–5.1)
Sodium: 138 mmol/L (ref 135–145)

## 2024-08-30 LAB — CBC WITH DIFFERENTIAL/PLATELET
Abs Immature Granulocytes: 0.07 K/uL (ref 0.00–0.07)
Basophils Absolute: 0 K/uL (ref 0.0–0.1)
Basophils Relative: 1 %
Eosinophils Absolute: 0 K/uL (ref 0.0–0.5)
Eosinophils Relative: 1 %
HCT: 27.5 % — ABNORMAL LOW (ref 36.0–46.0)
Hemoglobin: 8.9 g/dL — ABNORMAL LOW (ref 12.0–15.0)
Immature Granulocytes: 1 %
Lymphocytes Relative: 23 %
Lymphs Abs: 1.3 K/uL (ref 0.7–4.0)
MCH: 28.8 pg (ref 26.0–34.0)
MCHC: 32.4 g/dL (ref 30.0–36.0)
MCV: 89 fL (ref 80.0–100.0)
Monocytes Absolute: 0.8 K/uL (ref 0.1–1.0)
Monocytes Relative: 14 %
Neutro Abs: 3.6 K/uL (ref 1.7–7.7)
Neutrophils Relative %: 60 %
Platelets: 572 K/uL — ABNORMAL HIGH (ref 150–400)
RBC: 3.09 MIL/uL — ABNORMAL LOW (ref 3.87–5.11)
RDW: 15.6 % — ABNORMAL HIGH (ref 11.5–15.5)
WBC: 5.9 K/uL (ref 4.0–10.5)
nRBC: 0 % (ref 0.0–0.2)

## 2024-08-30 LAB — CYTOLOGY - NON PAP

## 2024-08-30 MED ORDER — SODIUM CHLORIDE 0.9 % IV SOLN
3.0000 g | Freq: Four times a day (QID) | INTRAVENOUS | Status: AC
  Administered 2024-08-30 – 2024-09-16 (×68): 3 g via INTRAVENOUS
  Filled 2024-08-30 (×69): qty 8

## 2024-08-30 MED ORDER — LORAZEPAM 2 MG/ML IJ SOLN
0.5000 mg | Freq: Four times a day (QID) | INTRAMUSCULAR | Status: DC | PRN
  Administered 2024-08-30 – 2024-09-04 (×14): 0.5 mg via INTRAVENOUS
  Filled 2024-08-30 (×16): qty 1

## 2024-08-30 MED ORDER — HYDROMORPHONE HCL 1 MG/ML IJ SOLN
0.5000 mg | INTRAMUSCULAR | Status: DC | PRN
  Administered 2024-08-30: 1 mg via INTRAVENOUS
  Administered 2024-08-30: 0.5 mg via INTRAVENOUS
  Administered 2024-08-30 – 2024-09-02 (×22): 1 mg via INTRAVENOUS
  Filled 2024-08-30 (×25): qty 1

## 2024-08-30 NOTE — Progress Notes (Signed)
 Assumed care 1900. Patient calling out numerous times for liquids and pain medicine. Each time this RN entered with a knock and stating hello patient did not wake up. Patient sleeping with no sign of distress.   2000 Patient upset RN will not give dilaudid  and ativan  at same time. Patient given pain medicine, two spoonfulls of ice chips, and education regarding medication.   Around 2140 patient started calling for more pain medicine. RN busy in another room. Patient upset and called operator to find someone in the hospital to give me my medicine. RN entered room, patient rolled eyes and began yelling at RN. RN educated again she cannot have dilaudid  and ativan  together. Patient opted and agreed to take the ativan . Patient then began yelling that she was told she needed pain medicine because her abdomen was going to burst. Education provided on pain medication and bowel perf and the importance of not leaving the hospital. Patient let RN assess abd which was soft and tender to touch RUQ. Active bowel sounds. RN left room. No signs of distress noted. Patient resting comfortably in bed. Bed alarm on, call bell within reach.

## 2024-08-30 NOTE — Progress Notes (Signed)
 [facility] Progress Note  SUBJECTIVE:   Interval history: Jennifer [name] was seen and evaluated today at bedside. Patient evaluated alongside surgery team. Resting comfortably in bed. Noted her abdominal pain is 5/10 intensity. She is requesting diet, specifically liquids. Impressed upon her the severity of her condition and the need to remain NPO pending further testing (GI series timing per surgery team), she verbalized understanding. Eructation, no flatus. No melena, no chest pain or shortness of breath.   Past Medical History:  Diagnosis Date   bipolar    Bipolar 1 disorder (HCC)    Depression    Fibroids    now s/p hysterectomy   History of alcohol use 08/27/2024   Hypertension    Left ventricular hypertrophy    Polysubstance abuse (HCC)    IV drug us , cocaine on occassion, smoking and alcoholism, has been going to AA, not completely sober   Past Surgical History:  Procedure Laterality Date   ABDOMINAL HYSTERECTOMY     2003   BILATERAL SALPINGOOPHORECTOMY     01/2010   BONE BIOPSY  08/13/2024   Procedure: BIOPSY, GI;  Surgeon: Dianna Specking, MD;  Location: [facility];  Service: Gastroenterology;;   DILATION AND CURETTAGE OF UTERUS     ESOPHAGOGASTRODUODENOSCOPY N/A 08/13/2024   Procedure: EGD (ESOPHAGOGASTRODUODENOSCOPY);  Surgeon: Dianna Specking, MD;  Location: [facility];  Service: Gastroenterology;  Laterality: N/A;   EXPLORATORY LAPAROTOMY     complex pelvic mass 2011   TONSILLECTOMY     Current Facility-Administered Medications  Medication Dose Route Frequency Provider Last Rate Last Admin   0.9 %  sodium chloride  infusion (Manually program via Guardrails IV Fluids)   Intravenous Once Sundil, Subrina, MD   Held at 08/28/24 0147   acetaminophen  (TYLENOL ) tablet 650 mg  650 mg Oral Q6H PRN Sundil, Subrina, MD   650 mg at 08/28/24 1954   Or   acetaminophen  (TYLENOL ) suppository 650 mg  650 mg Rectal Q6H PRN Sundil, Subrina, MD   650 mg at 08/29/24  1150   dextrose  5 % in lactated ringers  infusion   Intravenous Continuous Patsy Lenis, MD 75 mL/hr at 08/29/24 1527 New Bag at 08/29/24 1527   HYDROmorphone  (DILAUDID ) injection 1 mg  1 mg Intravenous Q4H PRN Patsy Lenis, MD   1 mg at 08/30/24 0846   micafungin  (MYCAMINE ) 100 mg in sodium chloride  0.9 % 100 mL IVPB  100 mg Intravenous Q24H Patsy Lenis, MD   Stopped at 08/29/24 1250   ondansetron  (ZOFRAN ) injection 4 mg  4 mg Intravenous Once Sundil, Subrina, MD       pantoprazole  (PROTONIX ) injection 40 mg  40 mg Intravenous Q12H Sundil, Subrina, MD   40 mg at 08/29/24 2238   piperacillin -tazobactam (ZOSYN ) IVPB 3.375 g  3.375 g Intravenous Q8H Mattie Marvetta SQUIBB, RPH 12.5 mL/hr at 08/30/24 0410 3.375 g at 08/30/24 0410   sodium chloride  flush (NS) 0.9 % injection 3 mL  3 mL Intravenous Q12H Sundil, Subrina, MD   3 mL at 08/29/24 1017   sodium chloride  flush (NS) 0.9 % injection 3 mL  3 mL Intravenous Q12H Sundil, Subrina, MD   3 mL at 08/29/24 1017   sodium chloride  flush (NS) 0.9 % injection 3 mL  3 mL Intravenous PRN Sundil, Subrina, MD       sodium chloride  flush (NS) 0.9 % injection 5 mL  5 mL Intracatheter Q8H Suttle, Dylan J, MD   5 mL at 08/29/24 1500   traZODone  (DESYREL ) tablet 50 mg  50 mg Oral QHS PRN Patsy Lenis, MD       Allergies as of 08/27/2024   (No Known Allergies)   Review of Systems:  Review of Systems  Respiratory:  Negative for shortness of breath.   Cardiovascular:  Negative for chest pain.  Gastrointestinal:  Positive for abdominal pain. Negative for nausea and vomiting.    OBJECTIVE:   Temp:  [99 F (37.2 C)-101.1 F (38.4 C)] 99.8 F (37.7 C) (01/20 0828) Pulse Rate:  [71-79] 71 (01/20 0357) Resp:  [14-27] 20 (01/20 0828) BP: (101-122)/(52-79) 119/74 (01/20 0357) SpO2:  [86 %-94 %] 92 % (01/20 0828) Last BM Date : 08/28/24 Physical Exam Constitutional:      General: She is not in acute distress.    Appearance: She is not ill-appearing,  toxic-appearing or diaphoretic.  Cardiovascular:     Rate and Rhythm: Normal rate and regular rhythm.  Pulmonary:     Effort: No respiratory distress.     Breath sounds: Normal breath sounds.  Abdominal:     General: Bowel sounds are normal. There is no distension.     Palpations: Abdomen is soft.     Tenderness: There is no abdominal tenderness. There is no guarding.     Comments: JP drain with purulent fluid  Skin:    General: Skin is warm and dry.  Neurological:     Mental Status: She is alert.     Labs: Recent Labs    08/28/24 0500 08/29/24 0339 08/30/24 0217  WBC 9.8 8.1 5.9  HGB 10.2* 9.3* 8.9*  HCT 32.4* 28.5* 27.5*  PLT 647* 601* 572*   BMET Recent Labs    08/28/24 0500 08/29/24 0339 08/30/24 0217  NA 139 138 138  K 3.8 3.8 4.1  CL 107 105 106  CO2 21* 20* 23  GLUCOSE 78 90 92  BUN 14 12 7*  CREATININE 0.76 0.75 0.71  CALCIUM  8.8* 8.6* 8.8*   LFT Recent Labs    08/28/24 0500  PROT 7.0  ALBUMIN 2.5*  AST 32  ALT 42  ALKPHOS 129*  BILITOT 0.3   PT/INR Recent Labs    08/28/24 0848  LABPROT 15.7*  INR 1.2   Diagnostic imaging: DG CHEST PORT 1 VIEW Result Date: 08/29/2024 EXAM: 1 VIEW(S) XRAY OF THE CHEST 08/29/2024 09:38:00 PM COMPARISON: 08/28/2024 CLINICAL HISTORY: SOB (shortness of breath) FINDINGS: LUNGS AND PLEURA: There is a small right pleural effusion. There are bibasilar opacities, right greater than left, which may represent atelectasis. No pneumothorax. HEART AND MEDIASTINUM: Cardiomegaly. The heart is enlarged. BONES AND SOFT TISSUES: No acute osseous abnormality. IMPRESSION: 1. Cardiomegaly. 2. Small right pleural effusion. 3. Bibasilar opacities, right greater than left, which may represent atelectasis. Electronically signed by: Greig Pique MD 08/29/2024 09:44 PM EST RP Workstation: [id number]   CT GUIDED VISCERAL FLUID DRAIN BY PERC CATH Result Date: 08/28/2024 INDICATION: 64 year old female with history of perforated gastric ulcer  and perihepatic abscess formation. EXAM: CT IMAGE GUIDED DRAINAGE BY PERCUTANEOUS CATHETER COMPARISON:  08/27/2024 MEDICATIONS: The patient is currently admitted to the hospital and receiving intravenous antibiotics. The antibiotics were administered within an appropriate time frame prior to the initiation of the procedure. ANESTHESIA/SEDATION: Moderate (conscious) sedation was employed during this procedure. A total of Versed  2 mg and Fentanyl  100 mcg was administered intravenously. Moderate Sedation Time: 15 minutes. The patient's level of consciousness and vital signs were monitored continuously by radiology nursing throughout the procedure under my direct supervision. CONTRAST:  None COMPLICATIONS: None immediate.  PROCEDURE: RADIATION DOSE REDUCTION: This exam was performed according to the departmental dose-optimization program which includes automated exposure control, adjustment of the mA and/or kV according to patient size and/or use of iterative reconstruction technique. Informed written consent was obtained from the patient after a discussion of the risks, benefits and alternatives to treatment. The patient was placed supine on the CT gantry and a pre procedural CT was performed re-demonstrating the known abscess/fluid collection within the right upper quadrant. The procedure was planned. A timeout was performed prior to the initiation of the procedure. The right upper quadrant was prepped and draped in the usual sterile fashion. The overlying soft tissues were anesthetized with 1% lidocaine  with epinephrine . Appropriate trajectory was planned with the use of a 22 gauge spinal needle. An 18 gauge trocar needle was advanced into the abscess/fluid collection and a short Amplatz super stiff wire was coiled within the collection. Appropriate positioning was confirmed with a limited CT scan. The tract was serially dilated allowing placement of a 10 French all-purpose drainage catheter. Appropriate positioning  was confirmed with a limited postprocedural CT scan. Approximately 150 ml of purulent fluid was aspirated. The tube was connected to a bulb suction and sutured in place. A dressing was placed. The patient tolerated the procedure well without immediate post procedural complication. IMPRESSION: Successful CT guided placement of a 10 French all purpose drain catheter into the right upper quadrant perihepatic abscess with aspiration of 150 mL of purulent fluid. Samples were sent to the laboratory as requested by the ordering clinical team. Ester Sides, MD [facility] Radiology Electronically Signed   By: Ester Sides M.D.   On: 08/28/2024 18:52   US  THORACENTESIS RIGHT ASP PLEURAL SPACE W/IMG GUIDE Result Date: 08/28/2024 INDICATION: 64 year old female with perforated gastric ulcer, hepatic abscess and right pleural effusion. Request for therapeutic and diagnostic thoracentesis. EXAM: ULTRASOUND GUIDED RIGHT THORACENTESIS MEDICATIONS: 10 mL 1% lidocaine  COMPLICATIONS: None immediate. PROCEDURE: An ultrasound guided thoracentesis was thoroughly discussed with the patient and questions answered. The benefits, risks, alternatives and complications were also discussed. The patient understands and wishes to proceed with the procedure. Written consent was obtained. Ultrasound was performed to localize and mark an adequate pocket of fluid in the right chest. The area was then prepped and draped in the normal sterile fashion. 1% Lidocaine  was used for local anesthesia. Under ultrasound guidance a 6 Fr Safe-T-Centesis catheter was introduced. Thoracentesis was performed. The catheter was removed and a dressing applied. FINDINGS: A total of approximately 900 mL of hazy amber fluid was removed. Samples were sent to the laboratory as requested by the clinical team. Post procedure chest X-ray reviewed, negative for pneumothorax. IMPRESSION: Successful ultrasound guided right  thoracentesis yielding 900 mL of pleural fluid. Performed by: Aimee Han, PA-C. Electronically Signed   By: Ester Sides M.D.   On: 08/28/2024 13:57   DG Chest 1 View Result Date: 08/28/2024 EXAM: 1 VIEW(S) XRAY OF THE CHEST 08/28/2024 10:59:00 AM COMPARISON: 08/13/2024 CLINICAL HISTORY: Pleural effusion on right. FINDINGS: LUNGS AND PLEURA: Low lung volumes with progressive basilar atelectasis. Confluent airspace opacities at right lung base. Small right pleural effusion. No pneumothorax. HEART AND MEDIASTINUM: Aortic atherosclerosis. No acute abnormality of the cardiac and mediastinal silhouettes. BONES AND SOFT TISSUES: No acute osseous abnormality. IMPRESSION: 1. Small right pleural effusion. 2. Low lung volumes with progressive basilar atelectasis and confluent right basilar airspace opacities. 3. Aortic atherosclerosis. Electronically signed by: Evalene Coho MD 08/28/2024 11:16 AM EST RP Workstation:  [id number]   IMPRESSION: Perforated gastric antrum ulcer with fistulous connection to hepatic abscess              -Status post CT guided drainage 08/28/24  Abdominal pain secondary to above, improved Right sided pleural effusion status post thoracentesis  Melena Acute blood loss anemia NSAID use History polysubstance use  PLAN: - General Surgery recommendations, maintain NPO, ok for ice chips - Trend H/H, transfuse for Hgb < 7  - IV PPI Q12Hr - Monitor bowel movements - [facility] will follow   LOS: 3 days   [name], [facility]

## 2024-08-30 NOTE — Progress Notes (Signed)
 "      Subjective: Denies pain. Cc is wanting to eat/feeling hungry.  TMAX 100 WBC remains normal    Objective: Vital signs in last 24 hours: Temp:  [99 F (37.2 C)-101.1 F (38.4 C)] 99.8 F (37.7 C) (01/20 0828) Pulse Rate:  [71-79] 71 (01/20 0357) Resp:  [14-24] 20 (01/20 0828) BP: (103-122)/(52-74) 119/74 (01/20 0357) SpO2:  [86 %-94 %] 92 % (01/20 0828) Last BM Date : 08/28/24  Intake/Output from previous day: 01/19 0701 - 01/20 0700 In: 5  Out: 105 [Drains:105] Intake/Output this shift: No intake/output data recorded.  PE: General: resting comfortably, NAD  Neuro: alert and oriented, no focal deficits Resp: normal work of breathing on room air CV: RRR Abdomen: soft, mid distention, nontender to palpation. RUQ drain with turbid purulent fluid in bulb (105 cc)  Extremities: warm and well-perfused  Lab Results:  Recent Labs    08/29/24 0339 08/30/24 0217  WBC 8.1 5.9  HGB 9.3* 8.9*  HCT 28.5* 27.5*  PLT 601* 572*   BMET Recent Labs    08/29/24 0339 08/30/24 0217  NA 138 138  K 3.8 4.1  CL 105 106  CO2 20* 23  GLUCOSE 90 92  BUN 12 7*  CREATININE 0.75 0.71  CALCIUM  8.6* 8.8*   PT/INR Recent Labs    08/28/24 0848  LABPROT 15.7*  INR 1.2   CMP     Component Value Date/Time   NA 138 08/30/2024 0217   NA 141 05/24/2024 1310   K 4.1 08/30/2024 0217   CL 106 08/30/2024 0217   CO2 23 08/30/2024 0217   GLUCOSE 92 08/30/2024 0217   BUN 7 (L) 08/30/2024 0217   BUN 19 05/24/2024 1310   CREATININE 0.71 08/30/2024 0217   CREATININE 1.15 (H) 09/30/2016 1017   CALCIUM  8.8 (L) 08/30/2024 0217   PROT 7.0 08/28/2024 0500   PROT 7.2 05/24/2024 1310   ALBUMIN 2.5 (L) 08/28/2024 0500   ALBUMIN 4.3 05/24/2024 1310   AST 32 08/28/2024 0500   ALT 42 08/28/2024 0500   ALKPHOS 129 (H) 08/28/2024 0500   BILITOT 0.3 08/28/2024 0500   BILITOT 0.2 05/24/2024 1310   GFRNONAA >60 08/30/2024 0217   GFRNONAA 54 (L) 09/30/2016 1017   GFRAA >60 03/28/2019  1205   GFRAA 63 09/30/2016 1017   Lipase     Component Value Date/Time   LIPASE 49 08/27/2024 1821       Studies/Results: DG CHEST PORT 1 VIEW Result Date: 08/29/2024 EXAM: 1 VIEW(S) XRAY OF THE CHEST 08/29/2024 09:38:00 PM COMPARISON: 08/28/2024 CLINICAL HISTORY: SOB (shortness of breath) FINDINGS: LUNGS AND PLEURA: There is a small right pleural effusion. There are bibasilar opacities, right greater than left, which may represent atelectasis. No pneumothorax. HEART AND MEDIASTINUM: Cardiomegaly. The heart is enlarged. BONES AND SOFT TISSUES: No acute osseous abnormality. IMPRESSION: 1. Cardiomegaly. 2. Small right pleural effusion. 3. Bibasilar opacities, right greater than left, which may represent atelectasis. Electronically signed by: Greig Pique MD 08/29/2024 09:44 PM EST RP Workstation: [id number]   CT GUIDED VISCERAL FLUID DRAIN BY PERC CATH Result Date: 08/28/2024 INDICATION: 64 year old female with history of perforated gastric ulcer and perihepatic abscess formation. EXAM: CT IMAGE GUIDED DRAINAGE BY PERCUTANEOUS CATHETER COMPARISON:  08/27/2024 MEDICATIONS: The patient is currently admitted to the hospital and receiving intravenous antibiotics. The antibiotics were administered within an appropriate time frame prior to the initiation of the procedure. ANESTHESIA/SEDATION: Moderate (conscious) sedation was employed during this procedure. A total of Versed  2 mg  and Fentanyl  100 mcg was administered intravenously. Moderate Sedation Time: 15 minutes. The patient's level of consciousness and vital signs were monitored continuously by radiology nursing throughout the procedure under my direct supervision. CONTRAST:  None COMPLICATIONS: None immediate. PROCEDURE: RADIATION DOSE REDUCTION: This exam was performed according to the departmental dose-optimization program which includes automated exposure control, adjustment of the mA and/or kV according to patient size and/or use of iterative  reconstruction technique. Informed written consent was obtained from the patient after a discussion of the risks, benefits and alternatives to treatment. The patient was placed supine on the CT gantry and a pre procedural CT was performed re-demonstrating the known abscess/fluid collection within the right upper quadrant. The procedure was planned. A timeout was performed prior to the initiation of the procedure. The right upper quadrant was prepped and draped in the usual sterile fashion. The overlying soft tissues were anesthetized with 1% lidocaine  with epinephrine . Appropriate trajectory was planned with the use of a 22 gauge spinal needle. An 18 gauge trocar needle was advanced into the abscess/fluid collection and a short Amplatz super stiff wire was coiled within the collection. Appropriate positioning was confirmed with a limited CT scan. The tract was serially dilated allowing placement of a 10 French all-purpose drainage catheter. Appropriate positioning was confirmed with a limited postprocedural CT scan. Approximately 150 ml of purulent fluid was aspirated. The tube was connected to a bulb suction and sutured in place. A dressing was placed. The patient tolerated the procedure well without immediate post procedural complication. IMPRESSION: Successful CT guided placement of a 10 French all purpose drain catheter into the right upper quadrant perihepatic abscess with aspiration of 150 mL of purulent fluid. Samples were sent to the laboratory as requested by the ordering clinical team. Ester Sides, MD [facility] Radiology Electronically Signed   By: Ester Sides M.D.   On: 08/28/2024 18:52   US  THORACENTESIS RIGHT ASP PLEURAL SPACE W/IMG GUIDE Result Date: 08/28/2024 INDICATION: 64 year old female with perforated gastric ulcer, hepatic abscess and right pleural effusion. Request for therapeutic and diagnostic thoracentesis. EXAM: ULTRASOUND GUIDED  RIGHT THORACENTESIS MEDICATIONS: 10 mL 1% lidocaine  COMPLICATIONS: None immediate. PROCEDURE: An ultrasound guided thoracentesis was thoroughly discussed with the patient and questions answered. The benefits, risks, alternatives and complications were also discussed. The patient understands and wishes to proceed with the procedure. Written consent was obtained. Ultrasound was performed to localize and mark an adequate pocket of fluid in the right chest. The area was then prepped and draped in the normal sterile fashion. 1% Lidocaine  was used for local anesthesia. Under ultrasound guidance a 6 Fr Safe-T-Centesis catheter was introduced. Thoracentesis was performed. The catheter was removed and a dressing applied. FINDINGS: A total of approximately 900 mL of hazy amber fluid was removed. Samples were sent to the laboratory as requested by the clinical team. Post procedure chest X-ray reviewed, negative for pneumothorax. IMPRESSION: Successful ultrasound guided right thoracentesis yielding 900 mL of pleural fluid. Performed by: Aimee Han, PA-C. Electronically Signed   By: Ester Sides M.D.   On: 08/28/2024 13:57   DG Chest 1 View Result Date: 08/28/2024 EXAM: 1 VIEW(S) XRAY OF THE CHEST 08/28/2024 10:59:00 AM COMPARISON: 08/13/2024 CLINICAL HISTORY: Pleural effusion on right. FINDINGS: LUNGS AND PLEURA: Low lung volumes with progressive basilar atelectasis. Confluent airspace opacities at right lung base. Small right pleural effusion. No pneumothorax. HEART AND MEDIASTINUM: Aortic atherosclerosis. No acute abnormality of the cardiac and mediastinal silhouettes. BONES AND SOFT TISSUES:  No acute osseous abnormality. IMPRESSION: 1. Small right pleural effusion. 2. Low lung volumes with progressive basilar atelectasis and confluent right basilar airspace opacities. 3. Aortic atherosclerosis. Electronically signed by: Evalene Coho MD 08/28/2024 11:16 AM EST RP Workstation: [id number]     Anti-infectives: Anti-infectives (From admission, onward)    Start     Dose/Rate Route Frequency Ordered Stop   08/29/24 0815  micafungin  (MYCAMINE ) 100 mg in sodium chloride  0.9 % 100 mL IVPB        100 mg 105 mL/hr over 1 Hours Intravenous Every 24 hours 08/29/24 0807     08/28/24 1200  vancomycin  (VANCOREADY) IVPB 750 mg/150 mL  Status:  Discontinued        750 mg 150 mL/hr over 60 Minutes Intravenous Every 12 hours 08/27/24 2352 08/29/24 0926   08/28/24 1000  fluconazole  (DIFLUCAN ) tablet 400 mg        400 mg Oral Daily 08/27/24 2351 08/28/24 0928   08/28/24 0400  piperacillin -tazobactam (ZOSYN ) IVPB 3.375 g        3.375 g 12.5 mL/hr over 240 Minutes Intravenous Every 8 hours 08/27/24 2352     08/27/24 2345  vancomycin  (VANCOREADY) IVPB 1750 mg/350 mL        1,750 mg 175 mL/hr over 120 Minutes Intravenous  Once 08/27/24 2343 08/28/24 0706   08/27/24 2245  piperacillin -tazobactam (ZOSYN ) IVPB 3.375 g        3.375 g 100 mL/hr over 30 Minutes Intravenous  Once 08/27/24 2239 08/27/24 2325        Assessment/Plan 64 yo female with a known gastric antral ulcer, presenting with acute abdominal pain and a large upper abdominal abscess, consistent with a contained perforation. S/p percutaneous drain placement 1/18. - no fever today, clinically stable with a benign abdominal exam - Continue nonoperative management. I counseled patient that she needs to remain strictly NPO for now. I gave her about 2 TBSP of ice chips. NO meds. NO liquid diet.  - Continue IV PPI and antibiotics - H pylori stool antigen - Plan for upper GI in a few days to assess for ongoing leak, will discuss with MD, maybe Thursday 1/22? - If patient clinically deteriorates, will need surgical exploration     LOS: 3 days   Almarie Pringle, [facility] Surgery Please see Amion for pager number during day hours 7:00am-4:30pm      08/30/24 10:02 AM  "

## 2024-08-30 NOTE — Progress Notes (Signed)
 "                        PROGRESS NOTE        PATIENT DETAILS Name: Jennifer [name] Age: 64 y.o. Sex: female Date of Birth: 1961/08/03 Admit Date: 08/27/2024 Admitting Physician Micaela Speaker, MD ERE:Wztopw, Corrina, MD  Brief Summary: Patient is a 64 y.o.  female with history of HTN, EtOH use, substance abuse (prior UDS positive for opiates/cocaine/benzos)-who recently had a admission for melena-EGD showed gastric ulcer-patient subsequently left AMA-presented with generalized weakness/melanotic stools/upper abdominal pain-she was found to have right hepatic abscess with fistulous communication to a contained gastric perforation.  Significant events: 1/17>> admit to [facility]  Significant studies: 1/17>> CT abdomen/pelvis: Large right subhepatic subcapsular abscess with fistulous communication to the gastric antrum.  New moderate-sized right pleural effusion.  Significant microbiology data: 1/18>> blood culture: No growth 1/18>> pleural fluid culture: No growth 1/18>> right hepatic abscess culture: Lactobacillus/rare yeast  Procedures: 1/18>> CT-guided right perihepatic abscess drain placement 1/18>> thoracocentesis by IR  Consults: GI IR General surgery ID  Subjective: Has epigastric/RUQ tenderness-wants to eat-asking for diet.  Objective: Vitals: Blood pressure 119/74, pulse 71, temperature 99.8 F (37.7 C), temperature source Axillary, resp. rate 20, height 5' 4 (1.626 m), weight 81.6 kg, last menstrual period 12/04/2009, SpO2 92%.   Exam: Gen Exam:Alert awake-not in any distress HEENT:atraumatic, normocephalic Chest: B/L clear to auscultation anteriorly CVS:S1S2 regular Abdomen: Soft-mild tenderness in the epigastric area. Extremities:no edema Neurology: Non focal Skin: no rash  Pertinent Labs/Radiology:    Latest Ref Rng & Units 08/30/2024    2:17 AM 08/29/2024    3:39 AM 08/28/2024    5:00 AM  CBC  WBC 4.0 - 10.5 K/uL 5.9  8.1  9.8   Hemoglobin 12.0 - 15.0  g/dL 8.9  9.3  89.7   Hematocrit 36.0 - 46.0 % 27.5  28.5  32.4   Platelets 150 - 400 K/uL 572  601  647     Lab Results  Component Value Date   NA 138 08/30/2024   K 4.1 08/30/2024   CL 106 08/30/2024   CO2 23 08/30/2024      Assessment/Plan: Right hepatic abscess secondary to contained gastric perforation with fistulous communication S/p CT-guided drainage On Zosyn /micafungin  Remains n.p.o.-counseled again regarding importance of being n.p.o-per nursing staff-she was contemplating leaving AMA this morning.  I have advised her of the life-threatening and life disabling risk of leaving AGAINST MEDICAL ADVICE. General surgery planning on upper GI series in the next several days before initiating diet.  Contained gastric perforation with fistulous communication with right hepatic abscess See above  Recent upper GI bleed (EGD on 1/3 showed candidiasis and gastric ulcer) Completed a course of fluconazole  On PPI twice daily-6-8 weeks  Right-sided pleural effusion S/p thoracocentesis on 1/18 Exudative by lights criteria-however cultures negative This is felt to be sympathetic effusion to hepatic abscess On empiric antibiotics as above  HTN BP stable-not on any antihypertensives  EtOH use Apparently has quit drinking-no withdrawal symptoms  History of substance abuse (UDS positive for benzo/opiates/cocaine on 1/4) Will consult prior to discharge  Class 1 Obesity: Estimated body mass index is 30.9 kg/m as calculated from the following:   Height as of this encounter: 5' 4 (1.626 m).   Weight as of this encounter: 81.6 kg.   Code status:   Code Status: Full Code   DVT Prophylaxis: SCDs Start: 08/27/24 2352 Place TED hose Start: 08/27/24 2352  No pharmacological prophylaxis due to melanotic stools on initial presentation-recent GI bleed with gastric ulceration.   Family Communication: None at bedside   Disposition Plan: Status is: Inpatient Remains inpatient  appropriate because: Severity of illness   Planned Discharge Destination:Home   Diet: Diet Order             Diet NPO time specified  Diet effective now                     Antimicrobial agents: Anti-infectives (From admission, onward)    Start     Dose/Rate Route Frequency Ordered Stop   08/29/24 0815  micafungin  (MYCAMINE ) 100 mg in sodium chloride  0.9 % 100 mL IVPB        100 mg 105 mL/hr over 1 Hours Intravenous Every 24 hours 08/29/24 0807     08/28/24 1200  vancomycin  (VANCOREADY) IVPB 750 mg/150 mL  Status:  Discontinued        750 mg 150 mL/hr over 60 Minutes Intravenous Every 12 hours 08/27/24 2352 08/29/24 0926   08/28/24 1000  fluconazole  (DIFLUCAN ) tablet 400 mg        400 mg Oral Daily 08/27/24 2351 08/28/24 0928   08/28/24 0400  piperacillin -tazobactam (ZOSYN ) IVPB 3.375 g        3.375 g 12.5 mL/hr over 240 Minutes Intravenous Every 8 hours 08/27/24 2352     08/27/24 2345  vancomycin  (VANCOREADY) IVPB 1750 mg/350 mL        1,750 mg 175 mL/hr over 120 Minutes Intravenous  Once 08/27/24 2343 08/28/24 0706   08/27/24 2245  piperacillin -tazobactam (ZOSYN ) IVPB 3.375 g        3.375 g 100 mL/hr over 30 Minutes Intravenous  Once 08/27/24 2239 08/27/24 2325        MEDICATIONS: Scheduled Meds:  sodium chloride    Intravenous Once   ondansetron  (ZOFRAN ) IV  4 mg Intravenous Once   pantoprazole  (PROTONIX ) IV  40 mg Intravenous Q12H   sodium chloride  flush  3 mL Intravenous Q12H   sodium chloride  flush  3 mL Intravenous Q12H   sodium chloride  flush  5 mL Intracatheter Q8H   Continuous Infusions:  dextrose  5% lactated ringers  75 mL/hr at 08/29/24 1527   micafungin  (MYCAMINE ) 100 mg in sodium chloride  0.9 % 100 mL IVPB Stopped (08/29/24 1250)   piperacillin -tazobactam (ZOSYN )  IV 3.375 g (08/30/24 0410)   PRN Meds:.acetaminophen  **OR** acetaminophen , HYDROmorphone  (DILAUDID ) injection, sodium chloride  flush, traZODone    I have personally reviewed following  labs and imaging studies  LABORATORY DATA: CBC: Recent Labs  Lab 08/27/24 1821 08/28/24 0500 08/29/24 0339 08/30/24 0217  WBC 7.8 9.8 8.1 5.9  NEUTROABS 5.5  --  5.8 3.6  HGB 8.9* 10.2* 9.3* 8.9*  HCT 27.2* 32.4* 28.5* 27.5*  MCV 89.8 92.3 89.3 89.0  PLT 528* 647* 601* 572*    Basic Metabolic Panel: Recent Labs  Lab 08/27/24 1821 08/28/24 0500 08/29/24 0339 08/30/24 0217  NA 139 139 138 138  K 3.1* 3.8 3.8 4.1  CL 105 107 105 106  CO2 22 21* 20* 23  GLUCOSE 83 78 90 92  BUN 22 14 12  7*  CREATININE 0.90 0.76 0.75 0.71  CALCIUM  8.9 8.8* 8.6* 8.8*  MG 2.1  --  2.0  --   PHOS  --   --  3.0  --     GFR: Estimated Creatinine Clearance: 76.4 mL/min (by C-G formula based on SCr of 0.71 mg/dL).  Liver Function Tests: Recent  Labs  Lab 08/27/24 1821 08/28/24 0500  AST 31 32  ALT 46* 42  ALKPHOS 128* 129*  BILITOT 0.3 0.3  PROT 6.7 7.0  ALBUMIN 2.4* 2.5*   Recent Labs  Lab 08/27/24 1821  LIPASE 49   No results for input(s): AMMONIA in the last 168 hours.  Coagulation Profile: Recent Labs  Lab 08/28/24 0848  INR 1.2    Cardiac Enzymes: No results for input(s): CKTOTAL, CKMB, CKMBINDEX, TROPONINI in the last 168 hours.  BNP (last 3 results) No results for input(s): PROBNP in the last 8760 hours.  Lipid Profile: Recent Labs    08/29/24 0339  TRIG 73    Thyroid Function Tests: No results for input(s): TSH, T4TOTAL, FREET4, T3FREE, THYROIDAB in the last 72 hours.  Anemia Panel: No results for input(s): VITAMINB12, FOLATE, FERRITIN, TIBC, IRON, RETICCTPCT in the last 72 hours.  Urine analysis:    Component Value Date/Time   COLORURINE YELLOW 08/27/2024 2153   APPEARANCEUR HAZY (A) 08/27/2024 2153   LABSPEC 1.048 (H) 08/27/2024 2153   PHURINE 5.0 08/27/2024 2153   GLUCOSEU NEGATIVE 08/27/2024 2153   GLUCOSEU NEG mg/dL 95/71/7988 7984   HGBUR LARGE (A) 08/27/2024 2153   BILIRUBINUR NEGATIVE 08/27/2024 2153    KETONESUR NEGATIVE 08/27/2024 2153   PROTEINUR NEGATIVE 08/27/2024 2153   UROBILINOGEN 0.2 09/25/2014 1033   NITRITE NEGATIVE 08/27/2024 2153   LEUKOCYTESUR NEGATIVE 08/27/2024 2153    Sepsis Labs: Lactic Acid, Venous    Component Value Date/Time   LATICACIDVEN 1.3 09/08/2023 1426    MICROBIOLOGY: Recent Results (from the past 240 hours)  Culture, blood (Routine X 2) w Reflex to ID Panel     Status: None (Preliminary result)   Collection Time: 08/28/24  1:09 AM   Specimen: BLOOD  Result Value Ref Range Status   Specimen Description BLOOD SITE NOT SPECIFIED  Final   Special Requests   Final    BOTTLES DRAWN AEROBIC AND ANAEROBIC Blood Culture results may not be optimal due to an inadequate volume of blood received in culture bottles   Culture   Final    NO GROWTH 2 DAYS Performed at [facility], [address], [place], [place] [address]    Report Status PENDING  Incomplete  Body fluid culture w Gram Stain     Status: None (Preliminary result)   Collection Time: 08/28/24  7:44 AM   Specimen: Pleura; Body Fluid  Result Value Ref Range Status   Specimen Description PLEURAL  Final   Special Requests NONE  Final   Gram Stain RARE WBC SEEN NO ORGANISMS SEEN   Final   Culture   Final    NO GROWTH < 24 HOURS Performed at [facility], [address], [place], [place] [address]    Report Status PENDING  Incomplete  Culture, blood (Routine X 2) w Reflex to ID Panel     Status: None (Preliminary result)   Collection Time: 08/28/24 12:41 PM   Specimen: BLOOD RIGHT HAND  Result Value Ref Range Status   Specimen Description BLOOD RIGHT HAND  Final   Special Requests   Final    BOTTLES DRAWN AEROBIC ONLY Blood Culture results may not be optimal due to an inadequate volume of blood received in culture bottles   Culture   Final    NO GROWTH 2 DAYS Performed at [facility], [address], [place], [place] [address]    Report Status PENDING  Incomplete   Aerobic/Anaerobic  Culture w Gram Stain (surgical/deep wound)     Status: None (Preliminary result)   Collection Time: 08/28/24  3:57 PM   Specimen: Abscess  Result Value Ref Range Status   Specimen Description ABSCESS  Final   Special Requests ABDOMEN  Final   Gram Stain   Final    MODERATE WBC PRESENT, PREDOMINANTLY PMN ABUNDANT GRAM POSITIVE COCCI MODERATE GRAM NEGATIVE RODS ABUNDANT GRAM POSITIVE RODS RARE YEAST    Culture   Final    RARE LACTOBACILLUS SPECIES Standardized susceptibility testing for this organism is not available. Performed at [facility], [address], [place], [place] [address]    Report Status PENDING  Incomplete    RADIOLOGY STUDIES/RESULTS: DG CHEST PORT 1 VIEW Result Date: 08/29/2024 EXAM: 1 VIEW(S) XRAY OF THE CHEST 08/29/2024 09:38:00 PM COMPARISON: 08/28/2024 CLINICAL HISTORY: SOB (shortness of breath) FINDINGS: LUNGS AND PLEURA: There is a small right pleural effusion. There are bibasilar opacities, right greater than left, which may represent atelectasis. No pneumothorax. HEART AND MEDIASTINUM: Cardiomegaly. The heart is enlarged. BONES AND SOFT TISSUES: No acute osseous abnormality. IMPRESSION: 1. Cardiomegaly. 2. Small right pleural effusion. 3. Bibasilar opacities, right greater than left, which may represent atelectasis. Electronically signed by: Greig Pique MD 08/29/2024 09:44 PM EST RP Workstation: [id number]   CT GUIDED VISCERAL FLUID DRAIN BY PERC CATH Result Date: 08/28/2024 INDICATION: 64 year old female with history of perforated gastric ulcer and perihepatic abscess formation. EXAM: CT IMAGE GUIDED DRAINAGE BY PERCUTANEOUS CATHETER COMPARISON:  08/27/2024 MEDICATIONS: The patient is currently admitted to the hospital and receiving intravenous antibiotics. The antibiotics were administered within an appropriate time frame prior to the initiation of the procedure. ANESTHESIA/SEDATION: Moderate (conscious) sedation was employed during this  procedure. A total of Versed  2 mg and Fentanyl  100 mcg was administered intravenously. Moderate Sedation Time: 15 minutes. The patient's level of consciousness and vital signs were monitored continuously by radiology nursing throughout the procedure under my direct supervision. CONTRAST:  None COMPLICATIONS: None immediate. PROCEDURE: RADIATION DOSE REDUCTION: This exam was performed according to the departmental dose-optimization program which includes automated exposure control, adjustment of the mA and/or kV according to patient size and/or use of iterative reconstruction technique. Informed written consent was obtained from the patient after a discussion of the risks, benefits and alternatives to treatment. The patient was placed supine on the CT gantry and a pre procedural CT was performed re-demonstrating the known abscess/fluid collection within the right upper quadrant. The procedure was planned. A timeout was performed prior to the initiation of the procedure. The right upper quadrant was prepped and draped in the usual sterile fashion. The overlying soft tissues were anesthetized with 1% lidocaine  with epinephrine . Appropriate trajectory was planned with the use of a 22 gauge spinal needle. An 18 gauge trocar needle was advanced into the abscess/fluid collection and a short Amplatz super stiff wire was coiled within the collection. Appropriate positioning was confirmed with a limited CT scan. The tract was serially dilated allowing placement of a 10 French all-purpose drainage catheter. Appropriate positioning was confirmed with a limited postprocedural CT scan. Approximately 150 ml of purulent fluid was aspirated. The tube was connected to a bulb suction and sutured in place. A dressing was placed. The patient tolerated the procedure well without immediate post procedural complication. IMPRESSION: Successful CT guided placement of a 10 French all purpose drain catheter into the right upper quadrant  perihepatic abscess with aspiration of 150 mL of purulent fluid. Samples were sent to the  laboratory as requested by the ordering clinical team. Ester Sides, MD [facility] Electronically Signed   By: Ester Sides M.D.   On: 08/28/2024 18:52   US  THORACENTESIS RIGHT ASP PLEURAL SPACE W/IMG GUIDE Result Date: 08/28/2024 INDICATION: 64 year old female with perforated gastric ulcer, hepatic abscess and right pleural effusion. Request for therapeutic and diagnostic thoracentesis. EXAM: ULTRASOUND GUIDED RIGHT THORACENTESIS MEDICATIONS: 10 mL 1% lidocaine  COMPLICATIONS: None immediate. PROCEDURE: An ultrasound guided thoracentesis was thoroughly discussed with the patient and questions answered. The benefits, risks, alternatives and complications were also discussed. The patient understands and wishes to proceed with the procedure. Written consent was obtained. Ultrasound was performed to localize and mark an adequate pocket of fluid in the right chest. The area was then prepped and draped in the normal sterile fashion. 1% Lidocaine  was used for local anesthesia. Under ultrasound guidance a 6 Fr Safe-T-Centesis catheter was introduced. Thoracentesis was performed. The catheter was removed and a dressing applied. FINDINGS: A total of approximately 900 mL of hazy amber fluid was removed. Samples were sent to the laboratory as requested by the clinical team. Post procedure chest X-ray reviewed, negative for pneumothorax. IMPRESSION: Successful ultrasound guided right thoracentesis yielding 900 mL of pleural fluid. Performed by: Aimee Han, PA-C. Electronically Signed   By: Ester Sides M.D.   On: 08/28/2024 13:57   DG Chest 1 View Result Date: 08/28/2024 EXAM: 1 VIEW(S) XRAY OF THE CHEST 08/28/2024 10:59:00 AM COMPARISON: 08/13/2024 CLINICAL HISTORY: Pleural effusion on right. FINDINGS: LUNGS AND PLEURA: Low lung volumes with progressive basilar atelectasis.  Confluent airspace opacities at right lung base. Small right pleural effusion. No pneumothorax. HEART AND MEDIASTINUM: Aortic atherosclerosis. No acute abnormality of the cardiac and mediastinal silhouettes. BONES AND SOFT TISSUES: No acute osseous abnormality. IMPRESSION: 1. Small right pleural effusion. 2. Low lung volumes with progressive basilar atelectasis and confluent right basilar airspace opacities. 3. Aortic atherosclerosis. Electronically signed by: Evalene Coho MD 08/28/2024 11:16 AM EST RP Workstation: [id number]     LOS: 3 days   Donalda Applebaum, MD  [facility]    To contact the attending provider between 7A-7P or the covering provider during after hours 7P-7A, please log into the web site [email or web address] and access using universal [place] password for that web site. If you do not have the password, please call the hospital operator.  08/30/2024, 9:46 AM    "

## 2024-08-30 NOTE — Progress Notes (Signed)
 "   Referring Physician(s): Dr GORMAN Dawn  Supervising Physician: Jennefer Rover  Patient Status:  [facility] - In-pt  Chief Complaint:  Known gastric antral ulcer, presenting with acute abdominal pain and a large upper abdominal abscess, consistent with a contained perforation. S/p percutaneous drain placement 1/18.   Subjective:  Pt is up in bed; pleasant No complaints RUQ rain is intact OP purulent 99.8 this am  Allergies: Patient has no known allergies.  Medications: Prior to Admission medications  Medication Sig Start Date End Date Taking? Authorizing Provider  [Paused] amLODipine  (NORVASC ) 10 MG tablet TAKE 1 TABLET (10 MG TOTAL) BY MOUTH DAILY (Office visit for future refills) Wait to take this until your doctor or other care provider tells you to start again. 07/14/24   Newlin, Enobong, MD  atorvastatin  (LIPITOR) 10 MG tablet Take 1 tablet (10 mg total) by mouth daily. Patient not taking: Reported on 08/28/2024 07/14/24   Newlin, Enobong, MD  Blood Pressure Monitoring (BLOOD PRESSURE CUFF) MISC 1 Units by Does not apply route daily. 05/24/24   Mayers, Cari S, PA-C  dicyclomine  (BENTYL ) 20 MG tablet Take 1 tablet (20 mg total) by mouth 3 (three) times daily as needed for spasms. Patient not taking: Reported on 08/28/2024 08/22/24 09/21/24  Christobal Guadalajara, MD  folic acid  (FOLVITE ) 1 MG tablet Take 1 tablet (1 mg total) by mouth daily. Patient not taking: Reported on 08/28/2024 08/22/24 09/21/24  Christobal Guadalajara, MD  [Paused] lisinopril -hydrochlorothiazide  (ZESTORETIC ) 20-12.5 MG tablet Take 2 tablets by mouth daily. Wait to take this until your doctor or other care provider tells you to start again. 07/14/24   Newlin, Enobong, MD  metoprolol  succinate (TOPROL -XL) 50 MG 24 hr tablet Take 1 tablet (50 mg total) by mouth daily. Patient not taking: Reported on 08/28/2024 07/14/24   Newlin, Enobong, MD  oxyCODONE -acetaminophen  (PERCOCET/ROXICET) 5-325 MG tablet Take 1 tablet by mouth every 6 (six) hours as  needed for moderate pain (pain score 4-6). Patient not taking: Reported on 08/28/2024 08/22/24   Christobal Guadalajara, MD  pantoprazole  (PROTONIX ) 40 MG tablet Take 1 tablet (40 mg total) by mouth 2 (two) times daily. Patient not taking: Reported on 08/28/2024 08/22/24 09/21/24  Christobal Guadalajara, MD  thiamine  (VITAMIN B-1) 100 MG tablet Take 1 tablet (100 mg total) by mouth daily. Patient not taking: Reported on 08/28/2024 08/22/24 09/21/24  Christobal Guadalajara, MD     Vital Signs: BP 119/74 (BP Location: Left Arm)   Pulse 71   Temp 99.8 F (37.7 C) (Axillary)   Resp 20   Ht 5' 4 (1.626 m)   Wt 180 lb (81.6 kg)   LMP 12/04/2009   SpO2 92%   BMI 30.90 kg/m   Physical Exam Abdominal:     General: There is no distension.     Palpations: Abdomen is soft.     Tenderness: There is no abdominal tenderness.  Skin:    General: Skin is warm and dry.     Findings: No rash.     Comments: Site is clean and dry NT No redness OP purulent in JP Flushes easily  Neurological:     Mental Status: She is alert and oriented to person, place, and time.  Psychiatric:        Behavior: Behavior normal.     Imaging: DG CHEST PORT 1 VIEW Result Date: 08/29/2024 EXAM: 1 VIEW(S) XRAY OF THE CHEST 08/29/2024 09:38:00 PM COMPARISON: 08/28/2024 CLINICAL HISTORY: SOB (shortness of breath) FINDINGS: LUNGS AND PLEURA: There is a small  right pleural effusion. There are bibasilar opacities, right greater than left, which may represent atelectasis. No pneumothorax. HEART AND MEDIASTINUM: Cardiomegaly. The heart is enlarged. BONES AND SOFT TISSUES: No acute osseous abnormality. IMPRESSION: 1. Cardiomegaly. 2. Small right pleural effusion. 3. Bibasilar opacities, right greater than left, which may represent atelectasis. Electronically signed by: Greig Pique MD 08/29/2024 09:44 PM EST RP Workstation: [id number]   CT GUIDED VISCERAL FLUID DRAIN BY PERC CATH Result Date: 08/28/2024 INDICATION: 64 year old female with history of perforated  gastric ulcer and perihepatic abscess formation. EXAM: CT IMAGE GUIDED DRAINAGE BY PERCUTANEOUS CATHETER COMPARISON:  08/27/2024 MEDICATIONS: The patient is currently admitted to the hospital and receiving intravenous antibiotics. The antibiotics were administered within an appropriate time frame prior to the initiation of the procedure. ANESTHESIA/SEDATION: Moderate (conscious) sedation was employed during this procedure. A total of Versed  2 mg and Fentanyl  100 mcg was administered intravenously. Moderate Sedation Time: 15 minutes. The patient's level of consciousness and vital signs were monitored continuously by radiology nursing throughout the procedure under my direct supervision. CONTRAST:  None COMPLICATIONS: None immediate. PROCEDURE: RADIATION DOSE REDUCTION: This exam was performed according to the departmental dose-optimization program which includes automated exposure control, adjustment of the mA and/or kV according to patient size and/or use of iterative reconstruction technique. Informed written consent was obtained from the patient after a discussion of the risks, benefits and alternatives to treatment. The patient was placed supine on the CT gantry and a pre procedural CT was performed re-demonstrating the known abscess/fluid collection within the right upper quadrant. The procedure was planned. A timeout was performed prior to the initiation of the procedure. The right upper quadrant was prepped and draped in the usual sterile fashion. The overlying soft tissues were anesthetized with 1% lidocaine  with epinephrine . Appropriate trajectory was planned with the use of a 22 gauge spinal needle. An 18 gauge trocar needle was advanced into the abscess/fluid collection and a short Amplatz super stiff wire was coiled within the collection. Appropriate positioning was confirmed with a limited CT scan. The tract was serially dilated allowing placement of a 10 French all-purpose drainage catheter. Appropriate  positioning was confirmed with a limited postprocedural CT scan. Approximately 150 ml of purulent fluid was aspirated. The tube was connected to a bulb suction and sutured in place. A dressing was placed. The patient tolerated the procedure well without immediate post procedural complication. IMPRESSION: Successful CT guided placement of a 10 French all purpose drain catheter into the right upper quadrant perihepatic abscess with aspiration of 150 mL of purulent fluid. Samples were sent to the laboratory as requested by the ordering clinical team. Ester Sides, MD [facility] Electronically Signed   By: Ester Sides M.D.   On: 08/28/2024 18:52   US  THORACENTESIS RIGHT ASP PLEURAL SPACE W/IMG GUIDE Result Date: 08/28/2024 INDICATION: 64 year old female with perforated gastric ulcer, hepatic abscess and right pleural effusion. Request for therapeutic and diagnostic thoracentesis. EXAM: ULTRASOUND GUIDED RIGHT THORACENTESIS MEDICATIONS: 10 mL 1% lidocaine  COMPLICATIONS: None immediate. PROCEDURE: An ultrasound guided thoracentesis was thoroughly discussed with the patient and questions answered. The benefits, risks, alternatives and complications were also discussed. The patient understands and wishes to proceed with the procedure. Written consent was obtained. Ultrasound was performed to localize and mark an adequate pocket of fluid in the right chest. The area was then prepped and draped in the normal sterile fashion. 1% Lidocaine  was used for local anesthesia. Under ultrasound guidance a 6 Fr Safe-T-Centesis catheter  was introduced. Thoracentesis was performed. The catheter was removed and a dressing applied. FINDINGS: A total of approximately 900 mL of hazy amber fluid was removed. Samples were sent to the laboratory as requested by the clinical team. Post procedure chest X-ray reviewed, negative for pneumothorax. IMPRESSION: Successful ultrasound  guided right thoracentesis yielding 900 mL of pleural fluid. Performed by: Aimee Han, PA-C. Electronically Signed   By: Ester Sides M.D.   On: 08/28/2024 13:57   DG Chest 1 View Result Date: 08/28/2024 EXAM: 1 VIEW(S) XRAY OF THE CHEST 08/28/2024 10:59:00 AM COMPARISON: 08/13/2024 CLINICAL HISTORY: Pleural effusion on right. FINDINGS: LUNGS AND PLEURA: Low lung volumes with progressive basilar atelectasis. Confluent airspace opacities at right lung base. Small right pleural effusion. No pneumothorax. HEART AND MEDIASTINUM: Aortic atherosclerosis. No acute abnormality of the cardiac and mediastinal silhouettes. BONES AND SOFT TISSUES: No acute osseous abnormality. IMPRESSION: 1. Small right pleural effusion. 2. Low lung volumes with progressive basilar atelectasis and confluent right basilar airspace opacities. 3. Aortic atherosclerosis. Electronically signed by: Evalene Coho MD 08/28/2024 11:16 AM EST RP Workstation: [id number]   CT ABDOMEN PELVIS W CONTRAST Result Date: 08/27/2024 EXAM: CT ABDOMEN AND PELVIS WITH CONTRAST 08/27/2024 09:02:43 PM TECHNIQUE: CT of the abdomen and pelvis was performed with the administration of intravenous contrast. Multiplanar reformatted images are provided for review. Automated exposure control, iterative reconstruction, and/or weight-based adjustment of the mA/kV was utilized to reduce the radiation dose to as low as reasonably achievable. COMPARISON: CT chest abdomen and pelvis 08/13/2024. CLINICAL HISTORY: Epigastric pain. FINDINGS: LOWER CHEST: There is a new moderate sized right pleural effusion with compressive atelectasis of the right lower lobe. There is new right middle lobe airspace consolidation with air bronchograms. LIVER: There is a new enhancing lateral right hepatic subcapsular fluid collection containing air measuring 12.3 x 3.8 x 16.1 cm. This abuts the anterior abdominal wall. GALLBLADDER AND BILE DUCTS: Gallbladder is unremarkable. No biliary ductal  dilatation. SPLEEN: No acute abnormality. PANCREAS: No acute abnormality. ADRENAL GLANDS: No acute abnormality. KIDNEYS, URETERS AND BLADDER: There is a 3 mm calculus in the inferior pole of the right kidney. No hydronephrosis. No perinephric or periureteral stranding. Urinary bladder is unremarkable. GI AND BOWEL: Air fluid tract is seen extending from the enhancing lateral right hepatic subcapsular fluid collection to the anterior superior gastric antrum best seen on coronal images 6/70-74. There is fluid distention of the distal esophagus, unchanged. There is wall thickening of the gastric antrum as seen on prior. There is mild inflammation surrounding the gastric antrum. The appendix appears normal. There is no bowel obstruction. PERITONEUM AND RETROPERITONEUM: No ascites. No free air. VASCULATURE: Aorta is normal in caliber. Atherosclerotic calcifications of the aorta and iliac arteries. LYMPH NODES: No lymphadenopathy. REPRODUCTIVE ORGANS: Uterus is not seen. BONES AND SOFT TISSUES: No acute osseous abnormality. There is scarring in the anterior abdominal wall. IMPRESSION: 1. Large right hepatic subcapsular abscess with fistulous communication to the gastric antrum. 2. Gastric antral wall thickening with mild surrounding inflammation, similar to prior. 3. New moderate sized right pleural effusion with compressive atelectasis of the right lower lobe and new right middle lobe airspace consolidation. Electronically signed by: Greig Pique MD 08/27/2024 09:25 PM EST RP Workstation: [id number]    Labs:  CBC: Recent Labs    08/27/24 1821 08/28/24 0500 08/29/24 0339 08/30/24 0217  WBC 7.8 9.8 8.1 5.9  HGB 8.9* 10.2* 9.3* 8.9*  HCT 27.2* 32.4* 28.5* 27.5*  PLT 528* 647* 601* 572*    COAGS:  Recent Labs    08/13/24 2127 08/28/24 0848  INR 1.0 1.2    BMP: Recent Labs    08/27/24 1821 08/28/24 0500 08/29/24 0339 08/30/24 0217  NA 139 139 138 138  K 3.1* 3.8 3.8 4.1  CL 105 107 105 106   CO2 22 21* 20* 23  GLUCOSE 83 78 90 92  BUN 22 14 12  7*  CALCIUM  8.9 8.8* 8.6* 8.8*  CREATININE 0.90 0.76 0.75 0.71  GFRNONAA >60 >60 >60 >60    LIVER FUNCTION TESTS: Recent Labs    08/12/24 0814 08/13/24 2025 08/27/24 1821 08/28/24 0500  BILITOT 0.6 0.4 0.3 0.3  AST 21 37 31 32  ALT 17 20 46* 42  ALKPHOS 63 60 128* 129*  PROT 7.8 8.0 6.7 7.0  ALBUMIN 4.3 4.3 2.4* 2.5*    Drain Location: RUQ Size: Fr size: 10 Fr Date of placement: 1/18  Currently to: Drain collection device: suction bulb 24 hour output:  Output by Drain (mL) 08/28/24 0701 - 08/28/24 1900 08/28/24 1901 - 08/29/24 0700 08/29/24 0701 - 08/29/24 1900 08/29/24 1901 - 08/30/24 0700 08/30/24 0701 - 08/30/24 1044  Closed System Drain 1 Right RLQ Bulb (JP) 10 Fr. 150 270 30 75     Interval imaging/drain manipulation:  None  Current examination: Flushes/aspirates easily.  Insertion site unremarkable. Suture and stat lock in place. Dressed appropriately.  OP purulent  Plan: Continue TID flushes with 5 cc NS. Record output Q shift. Dressing changes QD or PRN if soiled.  Call IR APP or on call IR MD if difficulty flushing or sudden change in drain output.  Repeat imaging/possible drain injection once output < 10 mL/QD (excluding flush material). Consideration for drain removal if output is < 10 mL/QD (excluding flush material), pending discussion with the providing surgical service.  Discharge planning: Please contact IR APP or on call IR MD prior to patient d/c to ensure appropriate follow up plans are in place. Typically patient will follow up with IR clinic 10-14 days post d/c for repeat imaging/possible drain injection. IR scheduler will contact patient with date/time of appointment. Patient will need to flush drain QD with 5 cc NS, record output QD, dressing changes every 2-3 days or earlier if soiled.   IR will continue to follow - please call with questions or concerns.  Electronically  Signed: Sharlet DELENA Candle, PA-C 08/30/2024, 10:44 AM   I spent a total of 15 Minutes at the the patient's bedside AND on the patient's hospital floor or unit, greater than 50% of which was counseling/coordinating care for RUQ abscess drain     "

## 2024-08-31 DIAGNOSIS — K316 Fistula of stomach and duodenum: Secondary | ICD-10-CM

## 2024-08-31 DIAGNOSIS — K922 Gastrointestinal hemorrhage, unspecified: Secondary | ICD-10-CM | POA: Diagnosis not present

## 2024-08-31 LAB — CBC
HCT: 28.1 % — ABNORMAL LOW (ref 36.0–46.0)
Hemoglobin: 9 g/dL — ABNORMAL LOW (ref 12.0–15.0)
MCH: 28.7 pg (ref 26.0–34.0)
MCHC: 32 g/dL (ref 30.0–36.0)
MCV: 89.5 fL (ref 80.0–100.0)
Platelets: 593 K/uL — ABNORMAL HIGH (ref 150–400)
RBC: 3.14 MIL/uL — ABNORMAL LOW (ref 3.87–5.11)
RDW: 15.3 % (ref 11.5–15.5)
WBC: 5 K/uL (ref 4.0–10.5)
nRBC: 0.4 % — ABNORMAL HIGH (ref 0.0–0.2)

## 2024-08-31 LAB — BODY FLUID CULTURE W GRAM STAIN: Culture: NO GROWTH

## 2024-08-31 LAB — COMPREHENSIVE METABOLIC PANEL WITH GFR
ALT: 27 U/L (ref 0–44)
AST: 20 U/L (ref 15–41)
Albumin: 2.3 g/dL — ABNORMAL LOW (ref 3.5–5.0)
Alkaline Phosphatase: 101 U/L (ref 38–126)
Anion gap: 8 (ref 5–15)
BUN: 6 mg/dL — ABNORMAL LOW (ref 8–23)
CO2: 26 mmol/L (ref 22–32)
Calcium: 8.8 mg/dL — ABNORMAL LOW (ref 8.9–10.3)
Chloride: 107 mmol/L (ref 98–111)
Creatinine, Ser: 0.65 mg/dL (ref 0.44–1.00)
GFR, Estimated: >60 mL/min
Glucose, Bld: 90 mg/dL (ref 70–99)
Potassium: 3.8 mmol/L (ref 3.5–5.1)
Sodium: 140 mmol/L (ref 135–145)
Total Bilirubin: 0.3 mg/dL (ref 0.0–1.2)
Total Protein: 6.4 g/dL — ABNORMAL LOW (ref 6.5–8.1)

## 2024-08-31 LAB — GLUCOSE, CAPILLARY: Glucose-Capillary: 97 mg/dL (ref 70–99)

## 2024-08-31 LAB — LEGIONELLA PNEUMOPHILA SEROGP 1 UR AG: L. pneumophila Serogp 1 Ur Ag: NEGATIVE

## 2024-08-31 NOTE — Progress Notes (Signed)
 [facility] Progress Note Patient: Jennifer [name] FMW:[id number] DOB: 1961-06-13  DOA: 08/27/2024 DOS: the patient was seen and examined on 08/31/2024  Brief Summary: Patient is a 64 y.o.  female with history of HTN, EtOH use, substance abuse (prior UDS positive for opiates/cocaine/benzos)-who recently had a admission for melena-EGD showed gastric ulcer-patient subsequently left AMA-presented with generalized weakness/melanotic stools/upper abdominal pain-she was found to have right hepatic abscess with fistulous communication to a contained gastric perforation.   Significant events: 1/17>> admit to [facility]   Significant studies: 1/17>> CT abdomen/pelvis: Large right subhepatic subcapsular abscess with fistulous communication to the gastric antrum.  New moderate-sized right pleural effusion.   Significant microbiology data: 1/18>> blood culture: No growth 1/18>> pleural fluid culture: No growth 1/18>> right hepatic abscess culture: Lactobacillus/rare yeast   Procedures: 1/18>> CT-guided right perihepatic abscess drain placement 1/18>> thoracocentesis by IR   Consults: [facility] IR General surgery ID   Assessment and Plan: Right hepatic abscess secondary to contained gastric perforation with fistulous communication S/p CT-guided drain placement by IR on 1/18 On Zosyn /micafungin  Remains n.p.o.  General surgery planning on upper GI series tomorrow. Currently on D5 LR.  If there is a leak noted on the upper GI series, patient will need to be on TPN. If there is no leak General Surgery planning advance diet to clear liquid diet.   Contained gastric perforation with fistulous communication with right hepatic abscess GI and general surgery I following.  Management as above.  Recent upper GI bleed (EGD on 1/3 showed candidiasis and gastric ulcer) Completed a course of fluconazole  On PPI twice daily-6-8 weeks   Right-sided pleural effusion S/p thoracocentesis on 1/18 Exudative  by lights criteria-however cultures negative This is felt to be sympathetic effusion to hepatic abscess On empiric antibiotics as above   HTN BP stable-not on any antihypertensives   EtOH use Apparently has quit drinking-no withdrawal symptoms   History of substance abuse (UDS positive for benzo/opiates/cocaine on 1/4) Will counsel the patient prior to discharge  Noncompliance Patient threatening to leave AMA if not allowed to eat. Multiple providers have counseled her again and again regarding the importance of current management with being n.p.o. status. I have advised her of the life-threatening and life disabling risk of leaving AGAINST MEDICAL ADVICE.   Class 1 Obesity: Body mass index is 30.9 kg/m.  Placing the patient at high risk for him.  Normocytic acute on chronic blood loss anemia. Hemoglobin appears to be stable around 9.    Code Status: Full Code   DVT Prophylaxis: SCDs Start: 08/27/24 2352 Place TED hose Start: 08/27/24 2352    Data review I have Reviewed nursing notes, Vitals, and Lab results. Since last encounter, pertinent lab results CBC and BMP   . I have ordered test including CBC and BMP  . I have discussed pt's care plan and test results with general surgery  .   Family Communication: No one at bedside  Disposition Plan: Status is: Inpatient Remains inpatient appropriate because: Monitor for improvement in oral intake.  Diet: Diet Order             Diet NPO time specified Except for: Other (See Comments)  Diet effective now                   MEDICATIONS: Scheduled Meds:  sodium chloride    Intravenous Once   ondansetron  (ZOFRAN ) IV  4 mg Intravenous Once   pantoprazole  (PROTONIX ) IV  40 mg Intravenous Q12H  sodium chloride  flush  3 mL Intravenous Q12H   sodium chloride  flush  3 mL Intravenous Q12H   sodium chloride  flush  5 mL Intracatheter Q8H   Continuous Infusions:  ampicillin -sulbactam (UNASYN ) IV 3 g (08/31/24 1155)    dextrose  5% lactated ringers  75 mL/hr at 08/29/24 1527   micafungin  (MYCAMINE ) 100 mg in sodium chloride  0.9 % 100 mL IVPB 100 mg (08/31/24 0941)   PRN Meds:.acetaminophen  **OR** acetaminophen , HYDROmorphone  (DILAUDID ) injection, LORazepam , sodium chloride  flush, traZODone   Author: Yetta Blanch, MD  [facility] 08/31/2024  5:06 PM Between 7PM-7AM, please contact night-coverage, check [email or web address] for on call.

## 2024-08-31 NOTE — Progress Notes (Signed)
 "        [facility]  Date of Admission:  08/27/2024      Total days of antibiotics 3   ASSESSMENT: Jennifer [name] is a 64 y.o. female admitted with:   Gastric Antral Ulcer -  Contained Perforation -  Large Upper Abdominal / Subcapsular Liver Abscess -  She is S/P Perc Drain 1/18 with only lactobacillus growing on culture. Polymicrobial gram stain. Narrow to unasyn  for coverage.   Substance Use History -  Poor Health Literacy / [employer] -  Hx of ETOH use and polysubstance use. Previously left AMA due to frustration over NPO status.   Esophageal Candidiasis -  Plaques observed on EGD recently. Continue micafungin  to treat this. Needs 3 weeks total antifungals. Not clear she took this prior to admission.   Precautions Recommended:  Standard / universal    PLAN: Change zosyn  to unasyn  Continue micafungin  for now  Follow pending micro information, drain output and FU imaging.  I do not feel she would be a safe PICC line / OPAT candidate   Principal Problem:   GI bleed Active Problems:   Substance abuse (HCC)   Essential hypertension   Candidiasis of esophagus (HCC)   Hepatic abscess   Pleural effusion on right   History of GI bleed   History of alcohol use   Hypokalemia   Liver abscess   Gastric perforation (HCC)   Fistula   Esophageal candidiasis (HCC)   Polysubstance abuse (HCC)   Melena    sodium chloride    Intravenous Once   ondansetron  (ZOFRAN ) IV  4 mg Intravenous Once   pantoprazole  (PROTONIX ) IV  40 mg Intravenous Q12H   sodium chloride  flush  3 mL Intravenous Q12H   sodium chloride  flush  3 mL Intravenous Q12H   sodium chloride  flush  5 mL Intracatheter Q8H    SUBJECTIVE: She is unhappy about NPO status   Review of Systems: Review of Systems  Constitutional:  Negative for chills and fever.  HENT:  Negative for tinnitus.   Eyes:  Negative for blurred vision and photophobia.  Respiratory:  Negative for cough and  sputum production.   Cardiovascular:  Negative for chest pain.  Gastrointestinal:  Positive for abdominal pain. Negative for diarrhea, nausea and vomiting.  Genitourinary:  Negative for dysuria.  Skin:  Negative for rash.  Neurological:  Negative for headaches.    Allergies[1]  OBJECTIVE: Vitals:   08/30/24 2112 08/31/24 0052 08/31/24 0751 08/31/24 1410  BP: 116/86 118/71 121/68   Pulse: 79 73 78 88  Resp: 19  15 16   Temp: 99.5 F (37.5 C) 99 F (37.2 C) 98.8 F (37.1 C) 99 F (37.2 C)  TempSrc: Oral Axillary Oral Oral  SpO2:      Weight:      Height:       Body mass index is 30.9 kg/m.  Physical Exam  Lab Results Lab Results  Component Value Date   WBC 5.0 08/31/2024   HGB 9.0 (L) 08/31/2024   HCT 28.1 (L) 08/31/2024   MCV 89.5 08/31/2024   PLT 593 (H) 08/31/2024    Lab Results  Component Value Date   CREATININE 0.65 08/31/2024   BUN 6 (L) 08/31/2024   NA 140 08/31/2024   K 3.8 08/31/2024   CL 107 08/31/2024   CO2 26 08/31/2024    Lab Results  Component Value Date   ALT 27 08/31/2024   AST 20 08/31/2024   ALKPHOS 101 08/31/2024  BILITOT 0.3 08/31/2024     Microbiology: Recent Results (from the past 240 hours)  Culture, blood (Routine X 2) w Reflex to ID Panel     Status: None (Preliminary result)   Collection Time: 08/28/24  1:09 AM   Specimen: BLOOD  Result Value Ref Range Status   Specimen Description BLOOD SITE NOT SPECIFIED  Final   Special Requests   Final    BOTTLES DRAWN AEROBIC AND ANAEROBIC Blood Culture results may not be optimal due to an inadequate volume of blood received in culture bottles   Culture   Final    NO GROWTH 3 DAYS Performed at [facility], [address], [place], [place] [address]    Report Status PENDING  Incomplete  Body fluid culture w Gram Stain     Status: None   Collection Time: 08/28/24  7:44 AM   Specimen: Pleura; Body Fluid  Result Value Ref Range Status   Specimen Description PLEURAL  Final    Special Requests NONE  Final   Gram Stain RARE WBC SEEN NO ORGANISMS SEEN   Final   Culture   Final    NO GROWTH 3 DAYS Performed at [facility], [address] [place], [place] [address]    Report Status 08/31/2024 FINAL  Final  Culture, blood (Routine X 2) w Reflex to ID Panel     Status: None (Preliminary result)   Collection Time: 08/28/24 12:41 PM   Specimen: BLOOD RIGHT HAND  Result Value Ref Range Status   Specimen Description BLOOD RIGHT HAND  Final   Special Requests   Final    BOTTLES DRAWN AEROBIC ONLY Blood Culture results may not be optimal due to an inadequate volume of blood received in culture bottles   Culture   Final    NO GROWTH 3 DAYS Performed at [facility], [address] [place], [place] [address]    Report Status PENDING  Incomplete  Aerobic/Anaerobic Culture w Gram Stain (surgical/deep wound)     Status: None (Preliminary result)   Collection Time: 08/28/24  3:57 PM   Specimen: Abscess  Result Value Ref Range Status   Specimen Description ABSCESS  Final   Special Requests ABDOMEN  Final   Gram Stain   Final    MODERATE WBC PRESENT, PREDOMINANTLY PMN ABUNDANT GRAM POSITIVE COCCI MODERATE GRAM NEGATIVE RODS ABUNDANT GRAM POSITIVE RODS RARE YEAST Performed at [facility], [address] [place], [place] [address]    Culture   Final    RARE LACTOBACILLUS SPECIES Standardized susceptibility testing for this organism is not available. NO ANAEROBES ISOLATED; CULTURE IN PROGRESS FOR 5 DAYS    Report Status PENDING  Incomplete     [name], MSN, NP-C [facility]  [email or web address] Pager: [phone] Office: [phone] [facility] Main Line: [phone] *Secure Chat Communication Welcome     [1] No Known Allergies  "

## 2024-08-31 NOTE — Progress Notes (Signed)
 Pt woken up from deep sleep for lab draw. Immediately called out stating she needs pain medication and ativan . Patient states pain 22 out of 10 while resting comfortably on bed. No signs of distress. Pain medication given. Patient then asking for ativan  as well. Education provided again that RN will not give Ativan  and pain medication together. Patient resting in bed, no signs of distress, bed alarm on, call bell within reach.

## 2024-08-31 NOTE — Progress Notes (Signed)
 RN caught patient trying to remove drain multiple times. Education provided each time on importance of keeping drain. Patient refusing to wear a gown to help secure drain.

## 2024-08-31 NOTE — Plan of Care (Signed)

## 2024-08-31 NOTE — Progress Notes (Signed)
 "   Referring Physician(s): Stechschulte, Deward  Supervising Physician: Philip Cornet  Patient Status:  [facility] - In-pt  Chief Complaint: perihepatic abscess, s/p drain placement on 1/18 by Dr Jennefer  Subjective: Patient sitting up in bed. Reports ongoing mild abdominal pain which she recently received pain medication.   Allergies: Patient has no known allergies.  Medications: Prior to Admission medications  Medication Sig Start Date End Date Taking? Authorizing Provider  [Paused] amLODipine  (NORVASC ) 10 MG tablet TAKE 1 TABLET (10 MG TOTAL) BY MOUTH DAILY (Office visit for future refills) Wait to take this until your doctor or other care provider tells you to start again. 07/14/24   Newlin, Enobong, MD  atorvastatin  (LIPITOR) 10 MG tablet Take 1 tablet (10 mg total) by mouth daily. Patient not taking: Reported on 08/28/2024 07/14/24   Newlin, Enobong, MD  Blood Pressure Monitoring (BLOOD PRESSURE CUFF) MISC 1 Units by Does not apply route daily. 05/24/24   Mayers, Cari S, PA-C  dicyclomine  (BENTYL ) 20 MG tablet Take 1 tablet (20 mg total) by mouth 3 (three) times daily as needed for spasms. Patient not taking: Reported on 08/28/2024 08/22/24 09/21/24  Christobal Guadalajara, MD  folic acid  (FOLVITE ) 1 MG tablet Take 1 tablet (1 mg total) by mouth daily. Patient not taking: Reported on 08/28/2024 08/22/24 09/21/24  Christobal Guadalajara, MD  [Paused] lisinopril -hydrochlorothiazide  (ZESTORETIC ) 20-12.5 MG tablet Take 2 tablets by mouth daily. Wait to take this until your doctor or other care provider tells you to start again. 07/14/24   Newlin, Enobong, MD  metoprolol  succinate (TOPROL -XL) 50 MG 24 hr tablet Take 1 tablet (50 mg total) by mouth daily. Patient not taking: Reported on 08/28/2024 07/14/24   Newlin, Enobong, MD  oxyCODONE -acetaminophen  (PERCOCET/ROXICET) 5-325 MG tablet Take 1 tablet by mouth every 6 (six) hours as needed for moderate pain (pain score 4-6). Patient not taking: Reported on 08/28/2024 08/22/24   Christobal Guadalajara, MD  pantoprazole  (PROTONIX ) 40 MG tablet Take 1 tablet (40 mg total) by mouth 2 (two) times daily. Patient not taking: Reported on 08/28/2024 08/22/24 09/21/24  Christobal Guadalajara, MD  thiamine  (VITAMIN B-1) 100 MG tablet Take 1 tablet (100 mg total) by mouth daily. Patient not taking: Reported on 08/28/2024 08/22/24 09/21/24  Christobal Guadalajara, MD     Vital Signs: BP 121/68 (BP Location: Left Arm)   Pulse 78   Temp 98.8 F (37.1 C) (Oral)   Resp 15   Ht 5' 4 (1.626 m)   Wt 180 lb (81.6 kg)   LMP 12/04/2009   SpO2 93%   BMI 30.90 kg/m   Physical Exam Cardiovascular:     Rate and Rhythm: Normal rate.  Pulmonary:     Effort: Pulmonary effort is normal. No respiratory distress.  Abdominal:     Tenderness: There is abdominal tenderness.     Comments: RUQ drain- Flushes/aspirates easily Green purulent drainage in bulb Insertion site unremarkable Suture and stat lock in place Dressed appropriately  Skin:    General: Skin is warm.  Neurological:     Mental Status: She is alert.     Imaging: DG CHEST PORT 1 VIEW Result Date: 08/29/2024 EXAM: 1 VIEW(S) XRAY OF THE CHEST 08/29/2024 09:38:00 PM COMPARISON: 08/28/2024 CLINICAL HISTORY: SOB (shortness of breath) FINDINGS: LUNGS AND PLEURA: There is a small right pleural effusion. There are bibasilar opacities, right greater than left, which may represent atelectasis. No pneumothorax. HEART AND MEDIASTINUM: Cardiomegaly. The heart is enlarged. BONES AND SOFT TISSUES: No acute osseous abnormality. IMPRESSION:  1. Cardiomegaly. 2. Small right pleural effusion. 3. Bibasilar opacities, right greater than left, which may represent atelectasis. Electronically signed by: Greig Pique MD 08/29/2024 09:44 PM EST RP Workstation: [id number]   CT GUIDED VISCERAL FLUID DRAIN BY PERC CATH Result Date: 08/28/2024 INDICATION: 64 year old female with history of perforated gastric ulcer and perihepatic abscess formation. EXAM: CT IMAGE GUIDED DRAINAGE BY  PERCUTANEOUS CATHETER COMPARISON:  08/27/2024 MEDICATIONS: The patient is currently admitted to the hospital and receiving intravenous antibiotics. The antibiotics were administered within an appropriate time frame prior to the initiation of the procedure. ANESTHESIA/SEDATION: Moderate (conscious) sedation was employed during this procedure. A total of Versed  2 mg and Fentanyl  100 mcg was administered intravenously. Moderate Sedation Time: 15 minutes. The patient's level of consciousness and vital signs were monitored continuously by radiology nursing throughout the procedure under my direct supervision. CONTRAST:  None COMPLICATIONS: None immediate. PROCEDURE: RADIATION DOSE REDUCTION: This exam was performed according to the departmental dose-optimization program which includes automated exposure control, adjustment of the mA and/or kV according to patient size and/or use of iterative reconstruction technique. Informed written consent was obtained from the patient after a discussion of the risks, benefits and alternatives to treatment. The patient was placed supine on the CT gantry and a pre procedural CT was performed re-demonstrating the known abscess/fluid collection within the right upper quadrant. The procedure was planned. A timeout was performed prior to the initiation of the procedure. The right upper quadrant was prepped and draped in the usual sterile fashion. The overlying soft tissues were anesthetized with 1% lidocaine  with epinephrine . Appropriate trajectory was planned with the use of a 22 gauge spinal needle. An 18 gauge trocar needle was advanced into the abscess/fluid collection and a short Amplatz super stiff wire was coiled within the collection. Appropriate positioning was confirmed with a limited CT scan. The tract was serially dilated allowing placement of a 10 French all-purpose drainage catheter. Appropriate positioning was confirmed with a limited postprocedural CT scan. Approximately 150  ml of purulent fluid was aspirated. The tube was connected to a bulb suction and sutured in place. A dressing was placed. The patient tolerated the procedure well without immediate post procedural complication. IMPRESSION: Successful CT guided placement of a 10 French all purpose drain catheter into the right upper quadrant perihepatic abscess with aspiration of 150 mL of purulent fluid. Samples were sent to the laboratory as requested by the ordering clinical team. Ester Sides, MD [facility] Electronically Signed   By: Ester Sides M.D.   On: 08/28/2024 18:52   US  THORACENTESIS RIGHT ASP PLEURAL SPACE W/IMG GUIDE Result Date: 08/28/2024 INDICATION: 64 year old female with perforated gastric ulcer, hepatic abscess and right pleural effusion. Request for therapeutic and diagnostic thoracentesis. EXAM: ULTRASOUND GUIDED RIGHT THORACENTESIS MEDICATIONS: 10 mL 1% lidocaine  COMPLICATIONS: None immediate. PROCEDURE: An ultrasound guided thoracentesis was thoroughly discussed with the patient and questions answered. The benefits, risks, alternatives and complications were also discussed. The patient understands and wishes to proceed with the procedure. Written consent was obtained. Ultrasound was performed to localize and mark an adequate pocket of fluid in the right chest. The area was then prepped and draped in the normal sterile fashion. 1% Lidocaine  was used for local anesthesia. Under ultrasound guidance a 6 Fr Safe-T-Centesis catheter was introduced. Thoracentesis was performed. The catheter was removed and a dressing applied. FINDINGS: A total of approximately 900 mL of hazy amber fluid was removed. Samples were sent to the laboratory as requested  by the clinical team. Post procedure chest X-ray reviewed, negative for pneumothorax. IMPRESSION: Successful ultrasound guided right thoracentesis yielding 900 mL of pleural fluid. Performed by: Aimee Han,  PA-C. Electronically Signed   By: Ester Sides M.D.   On: 08/28/2024 13:57   DG Chest 1 View Result Date: 08/28/2024 EXAM: 1 VIEW(S) XRAY OF THE CHEST 08/28/2024 10:59:00 AM COMPARISON: 08/13/2024 CLINICAL HISTORY: Pleural effusion on right. FINDINGS: LUNGS AND PLEURA: Low lung volumes with progressive basilar atelectasis. Confluent airspace opacities at right lung base. Small right pleural effusion. No pneumothorax. HEART AND MEDIASTINUM: Aortic atherosclerosis. No acute abnormality of the cardiac and mediastinal silhouettes. BONES AND SOFT TISSUES: No acute osseous abnormality. IMPRESSION: 1. Small right pleural effusion. 2. Low lung volumes with progressive basilar atelectasis and confluent right basilar airspace opacities. 3. Aortic atherosclerosis. Electronically signed by: Evalene Coho MD 08/28/2024 11:16 AM EST RP Workstation: [id number]   CT ABDOMEN PELVIS W CONTRAST Result Date: 08/27/2024 EXAM: CT ABDOMEN AND PELVIS WITH CONTRAST 08/27/2024 09:02:43 PM TECHNIQUE: CT of the abdomen and pelvis was performed with the administration of intravenous contrast. Multiplanar reformatted images are provided for review. Automated exposure control, iterative reconstruction, and/or weight-based adjustment of the mA/kV was utilized to reduce the radiation dose to as low as reasonably achievable. COMPARISON: CT chest abdomen and pelvis 08/13/2024. CLINICAL HISTORY: Epigastric pain. FINDINGS: LOWER CHEST: There is a new moderate sized right pleural effusion with compressive atelectasis of the right lower lobe. There is new right middle lobe airspace consolidation with air bronchograms. LIVER: There is a new enhancing lateral right hepatic subcapsular fluid collection containing air measuring 12.3 x 3.8 x 16.1 cm. This abuts the anterior abdominal wall. GALLBLADDER AND BILE DUCTS: Gallbladder is unremarkable. No biliary ductal dilatation. SPLEEN: No acute abnormality. PANCREAS: No acute abnormality. ADRENAL  GLANDS: No acute abnormality. KIDNEYS, URETERS AND BLADDER: There is a 3 mm calculus in the inferior pole of the right kidney. No hydronephrosis. No perinephric or periureteral stranding. Urinary bladder is unremarkable. GI AND BOWEL: Air fluid tract is seen extending from the enhancing lateral right hepatic subcapsular fluid collection to the anterior superior gastric antrum best seen on coronal images 6/70-74. There is fluid distention of the distal esophagus, unchanged. There is wall thickening of the gastric antrum as seen on prior. There is mild inflammation surrounding the gastric antrum. The appendix appears normal. There is no bowel obstruction. PERITONEUM AND RETROPERITONEUM: No ascites. No free air. VASCULATURE: Aorta is normal in caliber. Atherosclerotic calcifications of the aorta and iliac arteries. LYMPH NODES: No lymphadenopathy. REPRODUCTIVE ORGANS: Uterus is not seen. BONES AND SOFT TISSUES: No acute osseous abnormality. There is scarring in the anterior abdominal wall. IMPRESSION: 1. Large right hepatic subcapsular abscess with fistulous communication to the gastric antrum. 2. Gastric antral wall thickening with mild surrounding inflammation, similar to prior. 3. New moderate sized right pleural effusion with compressive atelectasis of the right lower lobe and new right middle lobe airspace consolidation. Electronically signed by: Greig Pique MD 08/27/2024 09:25 PM EST RP Workstation: [id number]    Labs:  CBC: Recent Labs    08/28/24 0500 08/29/24 0339 08/30/24 0217 08/31/24 0350  WBC 9.8 8.1 5.9 5.0  HGB 10.2* 9.3* 8.9* 9.0*  HCT 32.4* 28.5* 27.5* 28.1*  PLT 647* 601* 572* 593*    COAGS: Recent Labs    08/13/24 2127 08/28/24 0848  INR 1.0 1.2    BMP: Recent Labs    08/28/24 0500 08/29/24 0339 08/30/24 0217 08/31/24 0350  NA 139 138  138 140  K 3.8 3.8 4.1 3.8  CL 107 105 106 107  CO2 21* 20* 23 26  GLUCOSE 78 90 92 90  BUN 14 12 7* 6*  CALCIUM  8.8* 8.6* 8.8*  8.8*  CREATININE 0.76 0.75 0.71 0.65  GFRNONAA >60 >60 >60 >60    LIVER FUNCTION TESTS: Recent Labs    08/13/24 2025 08/27/24 1821 08/28/24 0500 08/31/24 0350  BILITOT 0.4 0.3 0.3 0.3  AST 37 31 32 20  ALT 20 46* 42 27  ALKPHOS 60 128* 129* 101  PROT 8.0 6.7 7.0 6.4*  ALBUMIN 4.3 2.4* 2.5* 2.3*    Assessment and Plan:  Drain Location: RUQ Size: Fr size: 10 Fr Date of placement: 08/28/2024  Currently to: Drain collection device: suction bulb 24 hour output:  Output by Drain (mL) 08/29/24 0701 - 08/29/24 1900 08/29/24 1901 - 08/30/24 0700 08/30/24 0701 - 08/30/24 1900 08/30/24 1901 - 08/31/24 0700 08/31/24 0701 - 08/31/24 1020  Closed System Drain 1 Right RLQ Bulb (JP) 10 Fr. 30 75 210 70     Current examination: Flushes/aspirates easily.  Green purulent drainage in bulb. Insertion site unremarkable. Suture and stat lock in place. Dressed appropriately.   Plan: Continue TID flushes with 5 cc NS. Record output Q shift. Dressing changes QD or PRN if soiled.  Call IR APP or on call IR MD if difficulty flushing or sudden change in drain output.  Repeat imaging/possible drain injection once output < 10 mL/QD (excluding flush material). Consideration for drain removal if output is < 10 mL/QD (excluding flush material), pending discussion with the providing surgical service.  Discharge planning: Please contact IR APP or on call IR MD prior to patient d/c to ensure appropriate follow up plans are in place. Typically patient will follow up with IR clinic 10-14 days post d/c for repeat imaging/possible drain injection. IR scheduler will contact patient with date/time of appointment. Patient will need to flush drain QD with 5 cc NS, record output QD, dressing changes every 2-3 days or earlier if soiled.   IR will continue to follow - please call with questions or concerns.     Electronically Signed: Kalley Nicholl B Bricyn Labrada, NP 08/31/2024, 10:19 AM   I spent a total of 15 Minutes  at the the patient's bedside AND on the patient's hospital floor or unit, greater than 50% of which was counseling/coordinating care for drain follow up.   "

## 2024-08-31 NOTE — TOC CAGE-AID Note (Signed)
 Transition of Care [facility]) - CAGE-AID Screening   Patient Details  Name: Jennifer [name] MRN: [id number] Date of Birth: Apr 07, 1961  [facility]) CM/SW Contact:    Landry DELENA Senters, RN Phone Number: 08/31/2024, 4:16 PM   Clinical Narrative:  Patient denies need for drug or alcohol inpatient or outpatient counseling resources.  CAGE-AID Screening: Substance Abuse Screening unable to be completed due to: : Patient Refused  Have You Ever Felt You Ought to Cut Down on Your Drinking or Drug Use?: No Have People Annoyed You By Critizing Your Drinking Or Drug Use?: No Have You Felt Bad Or Guilty About Your Drinking Or Drug Use?: No Have You Ever Had a Drink or Used Drugs First Thing In The Morning to Steady Your Nerves or to Get Rid of a Hangover?: No CAGE-AID Score: 0  Substance Abuse Education Offered: Yes

## 2024-08-31 NOTE — Progress Notes (Signed)
 Pt refusing to wear tele. Education provided. MD notified.

## 2024-08-31 NOTE — Progress Notes (Signed)
 [facility] Progress Note  SUBJECTIVE:   Interval history: [name] was seen and evaluated today at bedside. Resting in bed. Hungry, agreeable to NPO per surgery recommendations, acknowledge plans for upper GI series tomorrow. No chest pain or shortness of breath. Passed flatus this AM, no BM.   Past Medical History:  Diagnosis Date   bipolar    Bipolar 1 disorder (HCC)    Depression    Fibroids    now s/p hysterectomy   History of alcohol use 08/27/2024   Hypertension    Left ventricular hypertrophy    Polysubstance abuse (HCC)    IV drug us , cocaine on occassion, smoking and alcoholism, has been going to AA, not completely sober   Past Surgical History:  Procedure Laterality Date   ABDOMINAL HYSTERECTOMY     2003   BILATERAL SALPINGOOPHORECTOMY     01/2010   BONE BIOPSY  08/13/2024   Procedure: BIOPSY, GI;  Surgeon: Dianna Specking, MD;  Location: [facility];  Service: Gastroenterology;;   DILATION AND CURETTAGE OF UTERUS     ESOPHAGOGASTRODUODENOSCOPY N/A 08/13/2024   Procedure: EGD (ESOPHAGOGASTRODUODENOSCOPY);  Surgeon: Dianna Specking, MD;  Location: [facility];  Service: Gastroenterology;  Laterality: N/A;   EXPLORATORY LAPAROTOMY     complex pelvic mass 2011   TONSILLECTOMY     Current Facility-Administered Medications  Medication Dose Route Frequency Provider Last Rate Last Admin   0.9 %  sodium chloride  infusion (Manually program via Guardrails IV Fluids)   Intravenous Once Sundil, Subrina, MD   Held at 08/28/24 0147   acetaminophen  (TYLENOL ) tablet 650 mg  650 mg Oral Q6H PRN Sundil, Subrina, MD   650 mg at 08/28/24 1954   Or   acetaminophen  (TYLENOL ) suppository 650 mg  650 mg Rectal Q6H PRN Sundil, Subrina, MD   650 mg at 08/30/24 1042   Ampicillin -Sulbactam (UNASYN ) 3 g in sodium chloride  0.9 % 100 mL IVPB  3 g Intravenous Q6H Dixon, Corean SAILOR, NP 200 mL/hr at 08/31/24 0550 3 g at 08/31/24 0550   dextrose  5 % in lactated ringers  infusion    Intravenous Continuous Patsy Lenis, MD 75 mL/hr at 08/29/24 1527 New Bag at 08/29/24 1527   HYDROmorphone  (DILAUDID ) injection 0.5-1 mg  0.5-1 mg Intravenous Q2H PRN Dasie Leonor CROME, MD   1 mg at 08/31/24 9071   LORazepam  (ATIVAN ) injection 0.5 mg  0.5 mg Intravenous Q6H PRN Raenelle Donalda HERO, MD   0.5 mg at 08/30/24 2200   micafungin  (MYCAMINE ) 100 mg in sodium chloride  0.9 % 100 mL IVPB  100 mg Intravenous Q24H Patsy Lenis, MD 105 mL/hr at 08/31/24 0941 100 mg at 08/31/24 0941   ondansetron  (ZOFRAN ) injection 4 mg  4 mg Intravenous Once Sundil, Subrina, MD       pantoprazole  (PROTONIX ) injection 40 mg  40 mg Intravenous Q12H Sundil, Subrina, MD   40 mg at 08/31/24 9062   sodium chloride  flush (NS) 0.9 % injection 3 mL  3 mL Intravenous Q12H Sundil, Subrina, MD   3 mL at 08/31/24 0942   sodium chloride  flush (NS) 0.9 % injection 3 mL  3 mL Intravenous Q12H Sundil, Subrina, MD   3 mL at 08/31/24 9062   sodium chloride  flush (NS) 0.9 % injection 3 mL  3 mL Intravenous PRN Sundil, Subrina, MD   3 mL at 08/31/24 0937   sodium chloride  flush (NS) 0.9 % injection 5 mL  5 mL Intracatheter Q8H Suttle, Dylan J, MD   5 mL at  08/30/24 1211   traZODone  (DESYREL ) tablet 50 mg  50 mg Oral QHS PRN Patsy Lenis, MD       Allergies as of 08/27/2024   (No Known Allergies)   Review of Systems:  Review of Systems  Respiratory:  Negative for shortness of breath.   Cardiovascular:  Negative for chest pain.  Gastrointestinal:  Positive for abdominal pain. Negative for constipation, diarrhea, nausea and vomiting.    OBJECTIVE:   Temp:  [98.8 F (37.1 C)-99.5 F (37.5 C)] 98.8 F (37.1 C) (01/21 0751) Pulse Rate:  [73-83] 78 (01/21 0751) Resp:  [15-20] 15 (01/21 0751) BP: (116-135)/(68-86) 121/68 (01/21 0751) SpO2:  [92 %-93 %] 93 % (01/20 1810) Last BM Date : 08/28/24 Physical Exam Constitutional:      General: She is not in acute distress.    Appearance: She is not ill-appearing,  toxic-appearing or diaphoretic.  Cardiovascular:     Rate and Rhythm: Normal rate and regular rhythm.  Pulmonary:     Effort: No respiratory distress.     Breath sounds: Normal breath sounds.     Comments: No supplemental oxygen use in place. Abdominal:     General: Bowel sounds are normal.     Tenderness: There is abdominal tenderness. There is no guarding.     Comments: Firm to palpation in epigastric region.  Skin:    General: Skin is warm and dry.     Comments: JP drain in RUQ with purulent fluid  Neurological:     Mental Status: She is alert.     Labs: Recent Labs    08/29/24 0339 08/30/24 0217 08/31/24 0350  WBC 8.1 5.9 5.0  HGB 9.3* 8.9* 9.0*  HCT 28.5* 27.5* 28.1*  PLT 601* 572* 593*   BMET Recent Labs    08/29/24 0339 08/30/24 0217 08/31/24 0350  NA 138 138 140  K 3.8 4.1 3.8  CL 105 106 107  CO2 20* 23 26  GLUCOSE 90 92 90  BUN 12 7* 6*  CREATININE 0.75 0.71 0.65  CALCIUM  8.6* 8.8* 8.8*   LFT Recent Labs    08/31/24 0350  PROT 6.4*  ALBUMIN 2.3*  AST 20  ALT 27  ALKPHOS 101  BILITOT 0.3   PT/INR No results for input(s): LABPROT, INR in the last 72 hours. Diagnostic imaging: DG CHEST PORT 1 VIEW Result Date: 08/29/2024 EXAM: 1 VIEW(S) XRAY OF THE CHEST 08/29/2024 09:38:00 PM COMPARISON: 08/28/2024 CLINICAL HISTORY: SOB (shortness of breath) FINDINGS: LUNGS AND PLEURA: There is a small right pleural effusion. There are bibasilar opacities, right greater than left, which may represent atelectasis. No pneumothorax. HEART AND MEDIASTINUM: Cardiomegaly. The heart is enlarged. BONES AND SOFT TISSUES: No acute osseous abnormality. IMPRESSION: 1. Cardiomegaly. 2. Small right pleural effusion. 3. Bibasilar opacities, right greater than left, which may represent atelectasis. Electronically signed by: Greig Pique MD 08/29/2024 09:44 PM EST RP Workstation: [id number]   IMPRESSION: Perforated gastric antrum ulcer with fistulous connection to hepatic  abscess              -Status post CT guided drainage 08/28/24  Abdominal pain secondary to above, improved Right sided pleural effusion status post thoracentesis  Melena Acute blood loss anemia, stable  NSAID use History polysubstance use  PLAN: - General Surgery management, await upper GI series - Trend H/H, bowel movements - IV PPI Q12Hr  - [facility] will follow   LOS: 4 days   [name], [facility]

## 2024-08-31 NOTE — TOC Initial Note (Signed)
 Transition of Care [facility]) - Initial/Assessment Note    Patient Details  Name: Jennifer [name] MRN: [id number] Date of Birth: 26-Aug-1960  Transition of Care [facility]) CM/SW Contact:    Landry DELENA Senters, RN Phone Number: 08/31/2024, 4:13 PM  Clinical Narrative:                 RR:[id number] history significant of history of GI bleed (recent admission 1/3 for melena-EGD showed esophageal plaques suspicious for candidiasis nonbleeding gastric ulcer and gastritis-patient left AMA before completion of the treatment), essential hypertension, polysubstance abuse disorder, tobacco use disorder, thrombocytopenia, essential hypertension, physical deconditioning/debility and weakness, and morbid obesity presented to emergency department complaining of black-colored stool that started today morning and patient also has generalized weakness.    Patient lives alone, reports having no support at home. She will need cab assistance home, and reports she is not able to afford this. TOC or DC lounge to assist with cab voucher.   Patient has PCP, manages own medications, DME reviewed-RW, patient reports she does not drive, uses city bus transportation and does not want additional resources for transportation through [place].   Potential plan for UGI series on 1/22.  CM will continue to follow.   Expected Discharge Plan: Home/Self Care Barriers to Discharge: Continued Medical Work up   Patient Goals and CMS Choice            Expected Discharge Plan and Services       Living arrangements for the past 2 months: Single Family Home                                      Prior Living Arrangements/Services Living arrangements for the past 2 months: Single Family Home Lives with:: Self Patient language and need for interpreter reviewed:: Yes Do you feel safe going back to the place where you live?: Yes      Need for Family Participation in Patient Care: No (Comment) Care giver support system in place?:  No (comment) Current home services: DME (RW) Criminal Activity/Legal Involvement Pertinent to Current Situation/Hospitalization: No - Comment as needed  Activities of Daily Living   ADL Screening (condition at time of admission) Independently performs ADLs?: Yes (appropriate for developmental age) Is the patient deaf or have difficulty hearing?: No Does the patient have difficulty seeing, even when wearing glasses/contacts?: No Does the patient have difficulty concentrating, remembering, or making decisions?: No  Permission Sought/Granted                  Emotional Assessment Appearance:: Developmentally appropriate Attitude/Demeanor/Rapport: Engaged Affect (typically observed): Calm Orientation: : Oriented to Self, Oriented to Place, Oriented to  Time, Oriented to Situation Alcohol / Substance Use: Not Applicable Psych Involvement: No (comment)  Admission diagnosis:  Mixed hyperlipidemia [E78.2] Melena [K92.1] 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] Hepatic abscess [K75.0] GI bleed [K92.2] Iron deficiency anemia due to chronic blood loss [D50.0] Patient Active Problem List   Diagnosis Date Noted   Liver abscess 08/29/2024   Gastric perforation (HCC) 08/29/2024   Fistula 08/29/2024   Esophageal candidiasis (HCC) 08/29/2024   Polysubstance abuse (HCC) 08/29/2024   Melena 08/29/2024   Hypokalemia 08/28/2024   Hepatic abscess 08/27/2024   Pleural effusion on right 08/27/2024   CAP (community acquired pneumonia) 08/27/2024   History of GI bleed 08/27/2024   History of alcohol use 08/27/2024   Candidiasis of duodenum 08/27/2024   Acute upper GI  bleeding 08/14/2024   Elevated troponin 08/14/2024   Chest pain at rest 08/14/2024   AKI (acute kidney injury) 08/13/2024   Candidiasis of esophagus (HCC) 08/13/2024   Gastric ulcer 08/13/2024   Gastritis 08/13/2024   GI bleed 08/12/2024   Mixed hyperlipidemia 05/24/2024   Mass of skin of right shoulder 02/20/2022   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/10/2021   Need for COVID-19 vaccine 09/10/2021   Essential hypertension 08/21/2021   Class 1 obesity due to excess calories with serious comorbidity and body mass index (BMI) of 34.0 to 34.9 in adult 08/21/2021   Alcohol use disorder, moderate, dependence (HCC)    Current smoker 08/12/2016   Homeless single person 08/12/2016   Skin lesion of left leg 03/29/2015   Trigger middle finger of right hand 01/11/2015   Discoloration of eye 01/11/2015   Bradycardia 01/11/2015   Skin lesion of scalp 07/17/2014   Leg swelling 05/20/2013   Vasomotor rhinitis 08/17/2012   Right leg pain 07/26/2012   Tooth ache 05/19/2012   Anxiety 02/26/2012   GERD (gastroesophageal reflux disease) 08/08/2011   Menopausal symptoms 07/23/2011   Mastalgia 07/23/2011   Bipolar disorder, current episode manic without psychotic features, moderate (HCC) 12/05/2010   HORDEOLUM EXTERNUM 08/27/2010   SEBACEOUS CYST, SCALP 05/06/2010   SYNOVIAL CYST 05/03/2010   Abdominal pain 12/06/2009   DRUG ABUSE, HX OF 12/06/2009   Lipoma of shoulder 02/06/2009   FIBROIDS, UTERUS 02/06/2009   Substance abuse (HCC) 02/06/2009   Essential hypertension, benign 02/06/2009   PAIN IN JOINT, SHOULDER REGION 02/06/2009   Insomnia 02/06/2009   PELVIC MASS 02/06/2009   PCP:  Delbert Clam, MD Pharmacy:   [facility] [address], [address] [place] [address] Phone: [phone] Fax: [phone]  [facility] [address] [place] [address] Phone: [phone] Fax: [phone]  [facility] [id number] [place], [place] - [address] [place] [address] Phone: [phone] Fax: [phone]     Social Drivers of Health (SDOH) Social History: SDOH Screenings   Food Insecurity: No Food Insecurity (08/29/2024)  Housing: Low Risk (08/29/2024)  Transportation Needs: No Transportation  Needs (08/29/2024)  Recent Concern: Transportation Needs - Unmet Transportation Needs (08/14/2024)  Utilities: Not At Risk (08/29/2024)  Depression (PHQ2-9): Low Risk (05/24/2024)  Recent Concern: Depression (PHQ2-9) - Medium Risk (03/04/2024)  Tobacco Use: High Risk (08/28/2024)   SDOH Interventions:     Readmission Risk Interventions     No data to display

## 2024-08-31 NOTE — Progress Notes (Signed)
 "      Subjective: Pain improving. Asking for milk.  RN notes from overnight noted -- trying to remove her own drain, refusing to wear gown, intermittently yelling at staff.   Objective: Vital signs in last 24 hours: Temp:  [98.8 F (37.1 C)-99.8 F (37.7 C)] 98.8 F (37.1 C) (01/21 0751) Pulse Rate:  [73-83] 78 (01/21 0751) Resp:  [15-20] 15 (01/21 0751) BP: (116-135)/(68-86) 121/68 (01/21 0751) SpO2:  [92 %-93 %] 93 % (01/20 1810) Last BM Date : 08/28/24  Intake/Output from previous day: 01/20 0701 - 01/21 0700 In: 50 [P.O.:40] Out: 280 [Drains:280] Intake/Output this shift: No intake/output data recorded.  PE: General: resting comfortably, NAD  Neuro: alert and oriented, no focal deficits Resp: normal work of breathing on room air CV: RRR Abdomen: soft, mid distention, mild epigastric tenderness to palpation without guarding or peritonitis. RUQ drain with turbid purulent fluid in bulb (280 cc total, 70 cc on last shift), non tender lower abdomen.  Extremities: warm and well-perfused  Lab Results:  Recent Labs    08/30/24 0217 08/31/24 0350  WBC 5.9 5.0  HGB 8.9* 9.0*  HCT 27.5* 28.1*  PLT 572* 593*   BMET Recent Labs    08/30/24 0217 08/31/24 0350  NA 138 140  K 4.1 3.8  CL 106 107  CO2 23 26  GLUCOSE 92 90  BUN 7* 6*  CREATININE 0.71 0.65  CALCIUM  8.8* 8.8*   PT/INR Recent Labs    08/28/24 0848  LABPROT 15.7*  INR 1.2   CMP     Component Value Date/Time   NA 140 08/31/2024 0350   NA 141 05/24/2024 1310   K 3.8 08/31/2024 0350   CL 107 08/31/2024 0350   CO2 26 08/31/2024 0350   GLUCOSE 90 08/31/2024 0350   BUN 6 (L) 08/31/2024 0350   BUN 19 05/24/2024 1310   CREATININE 0.65 08/31/2024 0350   CREATININE 1.15 (H) 09/30/2016 1017   CALCIUM  8.8 (L) 08/31/2024 0350   PROT 6.4 (L) 08/31/2024 0350   PROT 7.2 05/24/2024 1310   ALBUMIN 2.3 (L) 08/31/2024 0350   ALBUMIN 4.3 05/24/2024 1310   AST 20 08/31/2024 0350   ALT 27 08/31/2024 0350    ALKPHOS 101 08/31/2024 0350   BILITOT 0.3 08/31/2024 0350   BILITOT 0.2 05/24/2024 1310   GFRNONAA >60 08/31/2024 0350   GFRNONAA 54 (L) 09/30/2016 1017   GFRAA >60 03/28/2019 1205   GFRAA 63 09/30/2016 1017   Lipase     Component Value Date/Time   LIPASE 49 08/27/2024 1821       Studies/Results: DG CHEST PORT 1 VIEW Result Date: 08/29/2024 EXAM: 1 VIEW(S) XRAY OF THE CHEST 08/29/2024 09:38:00 PM COMPARISON: 08/28/2024 CLINICAL HISTORY: SOB (shortness of breath) FINDINGS: LUNGS AND PLEURA: There is a small right pleural effusion. There are bibasilar opacities, right greater than left, which may represent atelectasis. No pneumothorax. HEART AND MEDIASTINUM: Cardiomegaly. The heart is enlarged. BONES AND SOFT TISSUES: No acute osseous abnormality. IMPRESSION: 1. Cardiomegaly. 2. Small right pleural effusion. 3. Bibasilar opacities, right greater than left, which may represent atelectasis. Electronically signed by: Greig Pique MD 08/29/2024 09:44 PM EST RP Workstation: [id number]    Anti-infectives: Anti-infectives (From admission, onward)    Start     Dose/Rate Route Frequency Ordered Stop   08/30/24 1245  Ampicillin -Sulbactam (UNASYN ) 3 g in sodium chloride  0.9 % 100 mL IVPB        3 g 200 mL/hr over 30 Minutes Intravenous Every  6 hours 08/30/24 1151     08/29/24 0815  micafungin  (MYCAMINE ) 100 mg in sodium chloride  0.9 % 100 mL IVPB        100 mg 105 mL/hr over 1 Hours Intravenous Every 24 hours 08/29/24 0807     08/28/24 1200  vancomycin  (VANCOREADY) IVPB 750 mg/150 mL  Status:  Discontinued        750 mg 150 mL/hr over 60 Minutes Intravenous Every 12 hours 08/27/24 2352 08/29/24 0926   08/28/24 1000  fluconazole  (DIFLUCAN ) tablet 400 mg        400 mg Oral Daily 08/27/24 2351 08/28/24 0928   08/28/24 0400  piperacillin -tazobactam (ZOSYN ) IVPB 3.375 g  Status:  Discontinued        3.375 g 12.5 mL/hr over 240 Minutes Intravenous Every 8 hours 08/27/24 2352 08/30/24 1150    08/27/24 2345  vancomycin  (VANCOREADY) IVPB 1750 mg/350 mL        1,750 mg 175 mL/hr over 120 Minutes Intravenous  Once 08/27/24 2343 08/28/24 0706   08/27/24 2245  piperacillin -tazobactam (ZOSYN ) IVPB 3.375 g        3.375 g 100 mL/hr over 30 Minutes Intravenous  Once 08/27/24 2239 08/27/24 2325        Assessment/Plan 64 yo female with a known gastric antral ulcer, presenting with acute abdominal pain and a large upper abdominal abscess, consistent with a contained perforation. S/p percutaneous drain placement 1/18. - no fever today, clinically stable with a benign abdominal exam - Continue nonoperative management. I counseled patient that she needs to remain strictly NPO for now. Patient may have 2 TBSP ice every 4 hours -- she had an entire cup of ice in her hand when I saw her. Discussed with patient and RN.  - Continue IV PPI and antibiotics - H pylori stool antigen - Plan for upper GI tomorrow to see if leak has sealed. If still leaking I will recommend strict NPO and TPN. If not we will start clears.  - If patient clinically deteriorates, will need surgical exploration     LOS: 4 days   Almarie Pringle, [facility] Please see Amion for pager number during day hours 7:00am-4:30pm      08/31/24 8:22 AM  "

## 2024-09-01 ENCOUNTER — Inpatient Hospital Stay (HOSPITAL_COMMUNITY)

## 2024-09-01 DIAGNOSIS — J90 Pleural effusion, not elsewhere classified: Secondary | ICD-10-CM | POA: Diagnosis not present

## 2024-09-01 DIAGNOSIS — F1010 Alcohol abuse, uncomplicated: Secondary | ICD-10-CM | POA: Diagnosis not present

## 2024-09-01 DIAGNOSIS — K259 Gastric ulcer, unspecified as acute or chronic, without hemorrhage or perforation: Secondary | ICD-10-CM | POA: Diagnosis not present

## 2024-09-01 DIAGNOSIS — K750 Abscess of liver: Secondary | ICD-10-CM | POA: Diagnosis not present

## 2024-09-01 DIAGNOSIS — K316 Fistula of stomach and duodenum: Secondary | ICD-10-CM | POA: Diagnosis not present

## 2024-09-01 DIAGNOSIS — F1910 Other psychoactive substance abuse, uncomplicated: Secondary | ICD-10-CM | POA: Diagnosis not present

## 2024-09-01 DIAGNOSIS — K922 Gastrointestinal hemorrhage, unspecified: Secondary | ICD-10-CM | POA: Diagnosis not present

## 2024-09-01 DIAGNOSIS — B9689 Other specified bacterial agents as the cause of diseases classified elsewhere: Secondary | ICD-10-CM | POA: Diagnosis not present

## 2024-09-01 DIAGNOSIS — B3781 Candidal esophagitis: Secondary | ICD-10-CM | POA: Diagnosis not present

## 2024-09-01 DIAGNOSIS — K921 Melena: Secondary | ICD-10-CM | POA: Diagnosis not present

## 2024-09-01 LAB — COMPREHENSIVE METABOLIC PANEL WITH GFR
ALT: 29 U/L (ref 0–44)
AST: 28 U/L (ref 15–41)
Albumin: 2.5 g/dL — ABNORMAL LOW (ref 3.5–5.0)
Alkaline Phosphatase: 102 U/L (ref 38–126)
Anion gap: 10 (ref 5–15)
BUN: 6 mg/dL — ABNORMAL LOW (ref 8–23)
CO2: 26 mmol/L (ref 22–32)
Calcium: 9 mg/dL (ref 8.9–10.3)
Chloride: 108 mmol/L (ref 98–111)
Creatinine, Ser: 0.67 mg/dL (ref 0.44–1.00)
GFR, Estimated: >60 mL/min
Glucose, Bld: 94 mg/dL (ref 70–99)
Potassium: 3.9 mmol/L (ref 3.5–5.1)
Sodium: 144 mmol/L (ref 135–145)
Total Bilirubin: 0.3 mg/dL (ref 0.0–1.2)
Total Protein: 6.7 g/dL (ref 6.5–8.1)

## 2024-09-01 LAB — CBC
HCT: 27.5 % — ABNORMAL LOW (ref 36.0–46.0)
Hemoglobin: 8.7 g/dL — ABNORMAL LOW (ref 12.0–15.0)
MCH: 28.5 pg (ref 26.0–34.0)
MCHC: 31.6 g/dL (ref 30.0–36.0)
MCV: 90.2 fL (ref 80.0–100.0)
Platelets: 614 K/uL — ABNORMAL HIGH (ref 150–400)
RBC: 3.05 MIL/uL — ABNORMAL LOW (ref 3.87–5.11)
RDW: 15.1 % (ref 11.5–15.5)
WBC: 6.3 K/uL (ref 4.0–10.5)
nRBC: 0 % (ref 0.0–0.2)

## 2024-09-01 LAB — MAGNESIUM: Magnesium: 1.9 mg/dL (ref 1.7–2.4)

## 2024-09-01 LAB — PHOSPHORUS: Phosphorus: 2.6 mg/dL (ref 2.5–4.6)

## 2024-09-01 MED ORDER — IOHEXOL 300 MG/ML  SOLN
100.0000 mL | Freq: Once | INTRAMUSCULAR | Status: AC | PRN
  Administered 2024-09-01: 100 mL via ORAL

## 2024-09-01 MED ORDER — LORAZEPAM 2 MG/ML IJ SOLN
0.5000 mg | Freq: Once | INTRAMUSCULAR | Status: AC
  Administered 2024-09-01: 0.5 mg via INTRAVENOUS

## 2024-09-01 MED ORDER — BOOST / RESOURCE BREEZE PO LIQD CUSTOM
1.0000 | Freq: Three times a day (TID) | ORAL | Status: DC
  Administered 2024-09-01 – 2024-09-03 (×4): 1 via ORAL
  Administered 2024-09-03: 237 mL via ORAL
  Administered 2024-09-04 – 2024-09-07 (×6): 1 via ORAL

## 2024-09-01 NOTE — Progress Notes (Signed)
 [facility] Progress Note  SUBJECTIVE:   Interval history: Jennifer [name] was seen and evaluated today at bedside. Frustrated with lack of oral intake. Declined physical exam. Requested update on schedule for upper GI series, called fluoro team for uptake, pending this AM, updated patient.   Past Medical History:  Diagnosis Date   bipolar    Bipolar 1 disorder (HCC)    Depression    Fibroids    now s/p hysterectomy   History of alcohol use 08/27/2024   Hypertension    Left ventricular hypertrophy    Polysubstance abuse (HCC)    IV drug us , cocaine on occassion, smoking and alcoholism, has been going to AA, not completely sober   Past Surgical History:  Procedure Laterality Date   ABDOMINAL HYSTERECTOMY     2003   BILATERAL SALPINGOOPHORECTOMY     01/2010   BONE BIOPSY  08/13/2024   Procedure: BIOPSY, GI;  Surgeon: Dianna Specking, MD;  Location: [facility];  Service: Gastroenterology;;   DILATION AND CURETTAGE OF UTERUS     ESOPHAGOGASTRODUODENOSCOPY N/A 08/13/2024   Procedure: EGD (ESOPHAGOGASTRODUODENOSCOPY);  Surgeon: Dianna Specking, MD;  Location: [facility];  Service: Gastroenterology;  Laterality: N/A;   EXPLORATORY LAPAROTOMY     complex pelvic mass 2011   TONSILLECTOMY     Current Facility-Administered Medications  Medication Dose Route Frequency Provider Last Rate Last Admin   0.9 %  sodium chloride  infusion (Manually program via Guardrails IV Fluids)   Intravenous Once Sundil, Subrina, MD   Held at 08/28/24 0147   acetaminophen  (TYLENOL ) tablet 650 mg  650 mg Oral Q6H PRN Sundil, Subrina, MD   650 mg at 08/28/24 1954   Or   acetaminophen  (TYLENOL ) suppository 650 mg  650 mg Rectal Q6H PRN Sundil, Subrina, MD   650 mg at 08/30/24 1042   Ampicillin -Sulbactam (UNASYN ) 3 g in sodium chloride  0.9 % 100 mL IVPB  3 g Intravenous Q6H Dixon, Corean SAILOR, NP 200 mL/hr at 09/01/24 0555 3 g at 09/01/24 0555   dextrose  5 % in lactated ringers  infusion    Intravenous Continuous Patsy Lenis, MD 75 mL/hr at 08/29/24 1527 New Bag at 08/29/24 1527   HYDROmorphone  (DILAUDID ) injection 0.5-1 mg  0.5-1 mg Intravenous Q2H PRN Dasie Leonor CROME, MD   1 mg at 09/01/24 0858   LORazepam  (ATIVAN ) injection 0.5 mg  0.5 mg Intravenous Q6H PRN Raenelle Donalda HERO, MD   0.5 mg at 09/01/24 1007   micafungin  (MYCAMINE ) 100 mg in sodium chloride  0.9 % 100 mL IVPB  100 mg Intravenous Q24H Patsy Lenis, MD 105 mL/hr at 09/01/24 0855 100 mg at 09/01/24 0855   ondansetron  (ZOFRAN ) injection 4 mg  4 mg Intravenous Once Sundil, Subrina, MD       pantoprazole  (PROTONIX ) injection 40 mg  40 mg Intravenous Q12H Sundil, Subrina, MD   40 mg at 09/01/24 0857   sodium chloride  flush (NS) 0.9 % injection 3 mL  3 mL Intravenous Q12H Sundil, Subrina, MD   3 mL at 09/01/24 0900   sodium chloride  flush (NS) 0.9 % injection 3 mL  3 mL Intravenous Q12H Sundil, Subrina, MD   3 mL at 09/01/24 0901   sodium chloride  flush (NS) 0.9 % injection 3 mL  3 mL Intravenous PRN Sundil, Subrina, MD   3 mL at 08/31/24 0937   sodium chloride  flush (NS) 0.9 % injection 5 mL  5 mL Intracatheter Q8H Suttle, Ester PARAS, MD   5 mL at 08/30/24 1211  Allergies as of 08/27/2024   (No Known Allergies)   Review of Systems:  Review of Systems  Unable to perform ROS: Other    OBJECTIVE:   Temp:  [98.3 F (36.8 C)-99 F (37.2 C)] 98.9 F (37.2 C) (01/22 0805) Pulse Rate:  [73-88] 73 (01/22 0805) Resp:  [16-18] 18 (01/22 0456) BP: (121-123)/(74-94) 123/79 (01/22 0805) Last BM Date : 08/28/24 Physical Exam Constitutional:      General: She is not in acute distress.    Appearance: She is not toxic-appearing.  Pulmonary:     Effort: Pulmonary effort is normal. No respiratory distress.  Neurological:     Mental Status: She is alert.     Labs: Recent Labs    08/30/24 0217 08/31/24 0350 09/01/24 0238  WBC 5.9 5.0 6.3  HGB 8.9* 9.0* 8.7*  HCT 27.5* 28.1* 27.5*  PLT 572* 593* 614*    BMET Recent Labs    08/30/24 0217 08/31/24 0350 09/01/24 0238  NA 138 140 144  K 4.1 3.8 3.9  CL 106 107 108  CO2 23 26 26   GLUCOSE 92 90 94  BUN 7* 6* 6*  CREATININE 0.71 0.65 0.67  CALCIUM  8.8* 8.8* 9.0   LFT Recent Labs    09/01/24 0238  PROT 6.7  ALBUMIN 2.5*  AST 28  ALT 29  ALKPHOS 102  BILITOT 0.3   PT/INR No results for input(s): LABPROT, INR in the last 72 hours. Diagnostic imaging: No results found.  IMPRESSION: Perforated gastric antrum ulcer with fistulous connection to hepatic abscess              -Status post CT guided drainage 08/28/24  Abdominal pain secondary to above, improved Right sided pleural effusion status post thoracentesis  Melena Acute blood loss anemia, stable  NSAID use History polysubstance use  PLAN: - Await results of upper GI series, General Surgery management  - IV PPI Q12Hr - [facility] will follow   LOS: 5 days   Jennifer [name], [facility]

## 2024-09-01 NOTE — Progress Notes (Signed)
 "      Subjective: Cc I pain around her drain, epigastric pain still improved. Denies BM. +flatus.   When I ask what her understanding is of her admission she tells me that this happened because of her poor diet and drug use. Able to tell me that she has a hole in her stomach.   Objective: Vital signs in last 24 hours: Temp:  [98.3 F (36.8 C)-99 F (37.2 C)] 98.9 F (37.2 C) (01/22 0805) Pulse Rate:  [73-88] 73 (01/22 0805) Resp:  [16-18] 18 (01/22 0456) BP: (121-123)/(74-94) 123/79 (01/22 0805) Last BM Date : 08/28/24  Intake/Output from previous day: 01/21 0701 - 01/22 0700 In: 60 [P.O.:60] Out: 30 [Drains:30] Intake/Output this shift: No intake/output data recorded.  PE: General: resting comfortably, NAD  Neuro: alert and oriented, no focal deficits Resp: normal work of breathing on room air CV: RRR Abdomen: soft, there is epigastric fullness, mild epigastric tenderness to palpation without guarding or peritonitis. RUQ drain with light green purulent fluid (30 cc documented in epic), non tender lower abdomen.  Extremities: warm and well-perfused  Lab Results:  Recent Labs    08/31/24 0350 09/01/24 0238  WBC 5.0 6.3  HGB 9.0* 8.7*  HCT 28.1* 27.5*  PLT 593* 614*   BMET Recent Labs    08/31/24 0350 09/01/24 0238  NA 140 144  K 3.8 3.9  CL 107 108  CO2 26 26  GLUCOSE 90 94  BUN 6* 6*  CREATININE 0.65 0.67  CALCIUM  8.8* 9.0   PT/INR No results for input(s): LABPROT, INR in the last 72 hours.  CMP     Component Value Date/Time   NA 144 09/01/2024 0238   NA 141 05/24/2024 1310   K 3.9 09/01/2024 0238   CL 108 09/01/2024 0238   CO2 26 09/01/2024 0238   GLUCOSE 94 09/01/2024 0238   BUN 6 (L) 09/01/2024 0238   BUN 19 05/24/2024 1310   CREATININE 0.67 09/01/2024 0238   CREATININE 1.15 (H) 09/30/2016 1017   CALCIUM  9.0 09/01/2024 0238   PROT 6.7 09/01/2024 0238   PROT 7.2 05/24/2024 1310   ALBUMIN 2.5 (L) 09/01/2024 0238   ALBUMIN 4.3  05/24/2024 1310   AST 28 09/01/2024 0238   ALT 29 09/01/2024 0238   ALKPHOS 102 09/01/2024 0238   BILITOT 0.3 09/01/2024 0238   BILITOT 0.2 05/24/2024 1310   GFRNONAA >60 09/01/2024 0238   GFRNONAA 54 (L) 09/30/2016 1017   GFRAA >60 03/28/2019 1205   GFRAA 63 09/30/2016 1017   Lipase     Component Value Date/Time   LIPASE 49 08/27/2024 1821       Studies/Results: No results found.   Anti-infectives: Anti-infectives (From admission, onward)    Start     Dose/Rate Route Frequency Ordered Stop   08/30/24 1245  Ampicillin -Sulbactam (UNASYN ) 3 g in sodium chloride  0.9 % 100 mL IVPB        3 g 200 mL/hr over 30 Minutes Intravenous Every 6 hours 08/30/24 1151     08/29/24 0815  micafungin  (MYCAMINE ) 100 mg in sodium chloride  0.9 % 100 mL IVPB        100 mg 105 mL/hr over 1 Hours Intravenous Every 24 hours 08/29/24 0807     08/28/24 1200  vancomycin  (VANCOREADY) IVPB 750 mg/150 mL  Status:  Discontinued        750 mg 150 mL/hr over 60 Minutes Intravenous Every 12 hours 08/27/24 2352 08/29/24 0926   08/28/24 1000  fluconazole  (  DIFLUCAN ) tablet 400 mg        400 mg Oral Daily 08/27/24 2351 08/28/24 0928   08/28/24 0400  piperacillin -tazobactam (ZOSYN ) IVPB 3.375 g  Status:  Discontinued        3.375 g 12.5 mL/hr over 240 Minutes Intravenous Every 8 hours 08/27/24 2352 08/30/24 1150   08/27/24 2345  vancomycin  (VANCOREADY) IVPB 1750 mg/350 mL        1,750 mg 175 mL/hr over 120 Minutes Intravenous  Once 08/27/24 2343 08/28/24 0706   08/27/24 2245  piperacillin -tazobactam (ZOSYN ) IVPB 3.375 g        3.375 g 100 mL/hr over 30 Minutes Intravenous  Once 08/27/24 2239 08/27/24 2325        Assessment/Plan 64 yo female with a known gastric antral ulcer, presenting with acute abdominal pain and a large upper abdominal abscess, consistent with a contained perforation. S/p percutaneous drain placement 1/18. - no fever, clinically stable with a benign abdominal exam - Continue  nonoperative management. I counseled patient that she needs to remain strictly NPO for now. Patient may have 2 TBSP ice every 4 hours. - Continue IV PPI and antibiotics - H pylori stool antigen - UGI Today. If still leaking I will recommend strict NPO and TPN. If not we will start clears.  - If patient clinically deteriorates, will need surgical exploration     LOS: 5 days   Almarie Pringle, [facility] Please see Amion for pager number during day hours 7:00am-4:30pm      09/01/24 10:41 AM  "

## 2024-09-01 NOTE — Progress Notes (Signed)
 [facility] Progress Note Patient: Jennifer [name] FMW:[id number] DOB: [birth year]  DOA: 08/27/2024 DOS: the patient was seen and examined on 09/01/2024  Brief Summary: Patient is a 64 y.o.  female with history of HTN, EtOH use, substance abuse (prior UDS positive for opiates/cocaine/benzos)-who recently had a admission for melena-EGD showed gastric ulcer-patient subsequently left AMA-presented with generalized weakness/melanotic stools/upper abdominal pain-she was found to have right hepatic abscess with fistulous communication to a contained gastric perforation.   Significant events: 1/17>> admit to [facility] 1/22>>    Significant studies: 1/17>> CT abdomen/pelvis: Large right subhepatic subcapsular abscess with fistulous communication to the gastric antrum.  New moderate-sized right pleural effusion.   Significant microbiology data: 1/18>> blood culture: No growth 1/18>> pleural fluid culture: No growth 1/18>> right hepatic abscess culture: Lactobacillus/rare yeast   Procedures: 1/18>> CT-guided right perihepatic abscess drain placement 1/18>> thoracocentesis by IR   Consults: [facility] General surgery ID   Assessment and Plan: Right hepatic abscess secondary to contained gastric perforation with fistulous communication S/p CT-guided drain placement by IR on 1/18 On Zosyn /micafungin  Appreciate ID managing antibiotics. Appreciate General surgery consult. Upper GI series shows esophageal distention concerning for achalasia and no evidence of leakage of contrast. Advancing to clear liquid diet. Currently on IV Unasyn .  And micafungin .  Not recommended for home IV antibiotics.   Contained gastric perforation with fistulous communication with right hepatic abscess GI and general surgery I following.  Management as above.   Recent upper GI bleed (EGD on 1/3 showed candidiasis and gastric ulcer) Completed a course of fluconazole  On PPI twice daily-6-8 weeks   Right-sided  pleural effusion S/p thoracocentesis on 1/18 Exudative by lights criteria-however cultures negative This is felt to be sympathetic effusion to hepatic abscess On empiric antibiotics as above   HTN BP stable-not on any antihypertensives   EtOH use Apparently has quit drinking-no withdrawal symptoms   History of substance abuse (UDS positive for benzo/opiates/cocaine on 1/4) Will counsel the patient prior to discharge   Noncompliance Patient threatening to leave AMA if not allowed to eat. Multiple providers have counseled her again and again regarding the importance of current management with being n.p.o. status. Multiple providers including myself have advised her of the life-threatening and life disabling risk of leaving AGAINST MEDICAL ADVICE.   Class 1 Obesity: Body mass index is 30.9 kg/m.  Placing the patient at high risk for him.   Normocytic acute on chronic blood loss anemia. Hemoglobin appears to be stable around 9.    Code Status: Full Code   DVT Prophylaxis: SCDs Start: 08/27/24 2352 Place TED hose Start: 08/27/24 2352  Data review I have Reviewed nursing notes, Vitals, and Lab results. Labs are stable.  Still has thrombocytosis.  Vitals are stable.  Upper GI series today shows dilated and distended esophagus.  No evidence of leakage.  Discussed with GI and general surgery. Family Communication: No family present  Disposition Plan: Status is: Inpatient Remains inpatient appropriate because: Monitor for diet tolerance Diet: Diet Order             Diet NPO time specified Except for: Other (See Comments)  Diet effective now                   MEDICATIONS: Scheduled Meds:  sodium chloride    Intravenous Once   ondansetron  (ZOFRAN ) IV  4 mg Intravenous Once   pantoprazole  (PROTONIX ) IV  40 mg Intravenous Q12H   sodium chloride  flush  3 mL Intravenous Q12H  sodium chloride  flush  3 mL Intravenous Q12H   sodium chloride  flush  5 mL Intracatheter Q8H    Continuous Infusions:  ampicillin -sulbactam (UNASYN ) IV 3 g (09/01/24 0555)   dextrose  5% lactated ringers  75 mL/hr at 08/29/24 1527   micafungin  (MYCAMINE ) 100 mg in sodium chloride  0.9 % 100 mL IVPB 100 mg (08/31/24 0941)   PRN Meds:.acetaminophen  **OR** acetaminophen , HYDROmorphone  (DILAUDID ) injection, LORazepam , sodium chloride  flush  Author: Yetta Blanch, MD  [facility] 09/01/2024  7:58 AM Between 7PM-7AM, please contact night-coverage, check [email or web address] for on call.

## 2024-09-01 NOTE — Plan of Care (Signed)

## 2024-09-01 NOTE — Progress Notes (Signed)
 Pt got herself up in her rolator walker and pushed herself out into hallway with pump. While doing so she pulled her IV out. She is screaming at me in hallway saying NO one cares about Jennifer [name]. You  only care about everyone but me.  Once again I explained to her that she couldn't have anything to eat until her procedure report was read, according to the her Doctor.  She goes on to tell me, You don't care about me and your not even trying to help, We cleaned pt up and put back in bed. Pt then asked for her pain medication which is IV . I told her she could have it once another IV is placed.  She still upset and mad. Waiting on IV team as she is a difficult stick.

## 2024-09-01 NOTE — Progress Notes (Signed)
 Initial Nutrition Assessment  DOCUMENTATION CODES:   Obesity unspecified  INTERVENTION:   -Per discussion with pharmacy, possible TPN start on 09/01/24 vs 09/02/24 pending upper GI series results; recommendations and needs discussed -Recommend monitor Mg, K, and Phos and replete as needed secondary to high refeeding risk -Recommend MVI daily -Recommend 100 mg thiamine  daily x 7 days -RD will follow for diet advancement and advance supplements as appropriate  NUTRITION DIAGNOSIS:   Inadequate oral intake related to altered GI function as evidenced by NPO status.  GOAL:   Patient will meet greater than or equal to 90% of their needs  MONITOR:   Diet advancement  REASON FOR ASSESSMENT:   Consult Assessment of nutrition requirement/status  ASSESSMENT:   64 y.o. female with medical history significant of history of GI bleed (recent admission 1/3 for melena-EGD showed esophageal plaques suspicious for candidiasis nonbleeding gastric ulcer and gastritis-patient left AMA before completion of the treatment), essential hypertension, polysubstance abuse disorder, tobacco use disorder, thrombocytopenia, essential hypertension, physical deconditioning/debility and weakness, and morbid obesity presented complaining of black-colored stool that started 1/17/26and patient also has generalized weakness.  Patient admitted with acute GI bleed, candidiasis of esophagus, and hepatic abscess.   1/18- s/p CT guided right perihepatic abscess drain placement   Reviewed I/O's: +30 ml x 24 hours and +2.6 L since admission  Drains: 30 ml x 24 hours  Per general surgery notes, patient with perforated gastric antrum ulcer with fistulous connection to hepatic abscess. She has been NPO since 08/29/24.  Case discussed with pharmacist. Patient will undergo upper GI series to evaluate for a leak. If leak is present, plan for bowel rest and TPN. If there is no leak, plan to advance to clear liquid diet. Per  general surgery notes, patient will need surgical exploration if she deteriorates.   Per pharmacy, pending upper GI results, plan for TPN today versus tomorrow.   RD attempted to visit patient multiple times, however, was in with MD or down fro upper GI series at times of visit. Patient was in room at third visit attempt. Patient agitated and frustrated at time of visit, yelling into her phone where is the doctor?. Patient was not very engaged in RD conversation, hyperfixated on being able to speak with the doctor and being able to eat. She states I haven't been able to eat for 5 days and no one cares. RD did explain that this RD does care, but upper GI results are important in determining her plan of care and attempted to discuss options for nutrition care plan based on results. Patient replied that unless you can get the doctor or put a steak in my mouth, I don't want to hear it. RD again attempted to explain purpose of visit, but she refused any intervention and stated get out of my room now. Patient refused nutrition-focused physical exam.   Reviewed weight history; patient weight has ranged form 81.6-82.6 kg over the past 7 days.   Patient has been NPO for 4 days and unsure of diet history PTA. Patient is at high refeeding risk. RD discussed nutrition care plan and recommendations with pharmacist.    Medications reviewed and include zofran , protonix , dextrose  5% in lactated ringers  infusion @ 75 ml/hr  Lab Results  Component Value Date   HGBA1C 5.7 (H) 07/14/2024   PTA DM medications are none.   Labs reviewed: CBGS: 97-113 (inpatient orders for glycemic control are none). Tox screen positive for opiates.     Diet Order:  Diet Order             Diet NPO time specified Except for: Other (See Comments)  Diet effective now                   EDUCATION NEEDS:   Not appropriate for education at this time  Skin:  Skin Assessment: Reviewed RN Assessment  Last BM:   08/28/24  Height:   Ht Readings from Last 1 Encounters:  08/27/24 5' 4 (1.626 m)    Weight:   Wt Readings from Last 1 Encounters:  08/27/24 81.6 kg    Ideal Body Weight:  54.5 kg  BMI:  Body mass index is 30.9 kg/m.  Estimated Nutritional Needs:   Kcal:  1700-1900  Protein:  90-105 grams  Fluid:  1.7-1.9 L    Margery ORN, RD, LDN, CDCES Registered Dietitian III Certified Diabetes Care and Education Specialist If unable to reach this RD, please use RD Inpatient group chat on secure chat between hours of 8am-4 pm daily

## 2024-09-01 NOTE — Progress Notes (Signed)
 "        [facility]  Date of Admission:  08/27/2024      Total days of antibiotics 6   ASSESSMENT: Jennifer [name] is a 64 y.o. female admitted with:   Gastric Antral Ulcer -  Contained Perforation -  Large Upper Abdominal / Subcapsular Liver Abscess -  She is S/P Perc Drain 1/18 with only lactobacillus growing on culture. Polymicrobial gram stain. Narrowed to unasyn  for coverage.  She is on the way to Endo for upper GI study.  She is quite unhappy about persistent withholding of food.  They are still monitoring to general surgery's perspective to ensure there is no surgical indication for management here.  - Continue Unasyn  + micafungin   Right sided pleural effusion status post thoracentesis  - Suspect this is translocation from subscapular liver fluid collection.  Protein in this fluid was 3.9 indicating exudative process.  LDH from this fluid 216. Last chest x-ray on the 19th indicated small right pleural effusions with a larger on the right base bibasilar opacity.  I suspect this could also be a process related to chronic aspiration as well - Continue antibiotics  Substance Use History -  Poor Health Literacy / Insight -  Hx of ETOH use and polysubstance use. Previously left AMA due to frustration over NPO status.   Esophageal Candidiasis -  Plaques observed on EGD recently. Continue micafungin  to treat this. Needs 3 weeks total antifungals. Not clear she took this prior to admission.   - Will see what the EGD findings are to help determine further  Precautions Recommended:  Standard / universal    PLAN: Change zosyn  to unasyn  Continue micafungin  for now  Follow pending micro information, drain output and FU imaging.  I do not feel she would be a safe PICC line / OPAT candidate   Principal Problem:   GI bleed Active Problems:   Substance abuse (HCC)   Essential hypertension   Candidiasis of esophagus (HCC)   Hepatic abscess    Pleural effusion on right   History of GI bleed   History of alcohol use   Hypokalemia   Liver abscess   Gastric perforation (HCC)   Fistula   Esophageal candidiasis (HCC)   Polysubstance abuse (HCC)   Melena   Gastric fistula    sodium chloride    Intravenous Once   ondansetron  (ZOFRAN ) IV  4 mg Intravenous Once   pantoprazole  (PROTONIX ) IV  40 mg Intravenous Q12H   sodium chloride  flush  3 mL Intravenous Q12H   sodium chloride  flush  3 mL Intravenous Q12H   sodium chloride  flush  5 mL Intracatheter Q8H    SUBJECTIVE: She is unhappy about NPO status   Review of Systems: Review of Systems  Constitutional:  Negative for chills and fever.  HENT:  Negative for tinnitus.   Eyes:  Negative for blurred vision and photophobia.  Respiratory:  Negative for cough and sputum production.   Cardiovascular:  Negative for chest pain.  Gastrointestinal:  Positive for abdominal pain. Negative for diarrhea, nausea and vomiting.  Genitourinary:  Negative for dysuria.  Skin:  Negative for rash.  Neurological:  Negative for headaches.    Allergies[1]  OBJECTIVE: Vitals:   09/01/24 0027 09/01/24 0456 09/01/24 0805 09/01/24 1131  BP: (!) 123/94 121/74 123/79 135/83  Pulse: 74 74 73 84  Resp: 17 18    Temp: 98.3 F (36.8 C) 98.4 F (36.9 C) 98.9 F (37.2 C) 98.5 F (36.9  C)  TempSrc: Oral Oral Oral Oral  SpO2:      Weight:      Height:       Body mass index is 30.9 kg/m.  Physical Exam  Lab Results Lab Results  Component Value Date   WBC 6.3 09/01/2024   HGB 8.7 (L) 09/01/2024   HCT 27.5 (L) 09/01/2024   MCV 90.2 09/01/2024   PLT 614 (H) 09/01/2024    Lab Results  Component Value Date   CREATININE 0.67 09/01/2024   BUN 6 (L) 09/01/2024   NA 144 09/01/2024   K 3.9 09/01/2024   CL 108 09/01/2024   CO2 26 09/01/2024    Lab Results  Component Value Date   ALT 29 09/01/2024   AST 28 09/01/2024   ALKPHOS 102 09/01/2024   BILITOT 0.3 09/01/2024      Microbiology: Recent Results (from the past 240 hours)  Culture, blood (Routine X 2) w Reflex to ID Panel     Status: None (Preliminary result)   Collection Time: 08/28/24  1:09 AM   Specimen: BLOOD  Result Value Ref Range Status   Specimen Description BLOOD SITE NOT SPECIFIED  Final   Special Requests   Final    BOTTLES DRAWN AEROBIC AND ANAEROBIC Blood Culture results may not be optimal due to an inadequate volume of blood received in culture bottles   Culture   Final    NO GROWTH 4 DAYS Performed at [facility], [address] [place], [place] [address]    Report Status PENDING  Incomplete  Body fluid culture w Gram Stain     Status: None   Collection Time: 08/28/24  7:44 AM   Specimen: Pleura; Body Fluid  Result Value Ref Range Status   Specimen Description PLEURAL  Final   Special Requests NONE  Final   Gram Stain RARE WBC SEEN NO ORGANISMS SEEN   Final   Culture   Final    NO GROWTH 3 DAYS Performed at [facility], [address] [place], [place] [address]    Report Status 08/31/2024 FINAL  Final  Culture, blood (Routine X 2) w Reflex to ID Panel     Status: None (Preliminary result)   Collection Time: 08/28/24 12:41 PM   Specimen: BLOOD RIGHT HAND  Result Value Ref Range Status   Specimen Description BLOOD RIGHT HAND  Final   Special Requests   Final    BOTTLES DRAWN AEROBIC ONLY Blood Culture results may not be optimal due to an inadequate volume of blood received in culture bottles   Culture   Final    NO GROWTH 4 DAYS Performed at [facility], [address] [place], [place] [address]    Report Status PENDING  Incomplete  Aerobic/Anaerobic Culture w Gram Stain (surgical/deep wound)     Status: None (Preliminary result)   Collection Time: 08/28/24  3:57 PM   Specimen: Abscess  Result Value Ref Range Status   Specimen Description ABSCESS  Final   Special Requests ABDOMEN  Final   Gram Stain   Final    MODERATE WBC PRESENT,  PREDOMINANTLY PMN ABUNDANT GRAM POSITIVE COCCI MODERATE GRAM NEGATIVE RODS ABUNDANT GRAM POSITIVE RODS RARE YEAST Performed at [facility], [address] [place], [place] [address]    Culture   Final    RARE LACTOBACILLUS SPECIES Standardized susceptibility testing for this organism is not available. NO ANAEROBES ISOLATED; CULTURE IN PROGRESS FOR 5 DAYS  Report Status PENDING  Incomplete     [name], MSN, NP-C [facility]  [email or web address] Pager: [phone] Office: [phone] [facility] Main Line: [phone] *Secure Chat Communication Welcome      [1] No Known Allergies  "

## 2024-09-01 NOTE — Progress Notes (Incomplete)
 PT BEING  VERBALLY ABUSIVE THIS AM, SCREAMING AND CUSSING AT ME. REPORTS THAT WE AARE

## 2024-09-02 DIAGNOSIS — K255 Chronic or unspecified gastric ulcer with perforation: Secondary | ICD-10-CM | POA: Diagnosis not present

## 2024-09-02 DIAGNOSIS — K750 Abscess of liver: Secondary | ICD-10-CM | POA: Diagnosis not present

## 2024-09-02 DIAGNOSIS — J90 Pleural effusion, not elsewhere classified: Secondary | ICD-10-CM | POA: Diagnosis not present

## 2024-09-02 DIAGNOSIS — B9689 Other specified bacterial agents as the cause of diseases classified elsewhere: Secondary | ICD-10-CM | POA: Diagnosis not present

## 2024-09-02 DIAGNOSIS — L988 Other specified disorders of the skin and subcutaneous tissue: Secondary | ICD-10-CM

## 2024-09-02 DIAGNOSIS — K922 Gastrointestinal hemorrhage, unspecified: Secondary | ICD-10-CM | POA: Diagnosis not present

## 2024-09-02 DIAGNOSIS — K316 Fistula of stomach and duodenum: Secondary | ICD-10-CM | POA: Diagnosis not present

## 2024-09-02 LAB — CBC
HCT: 28.3 % — ABNORMAL LOW (ref 36.0–46.0)
Hemoglobin: 9.1 g/dL — ABNORMAL LOW (ref 12.0–15.0)
MCH: 29 pg (ref 26.0–34.0)
MCHC: 32.2 g/dL (ref 30.0–36.0)
MCV: 90.1 fL (ref 80.0–100.0)
Platelets: 388 K/uL (ref 150–400)
RBC: 3.14 MIL/uL — ABNORMAL LOW (ref 3.87–5.11)
RDW: 15.1 % (ref 11.5–15.5)
WBC: 4.8 K/uL (ref 4.0–10.5)
nRBC: 0 % (ref 0.0–0.2)

## 2024-09-02 LAB — AEROBIC/ANAEROBIC CULTURE W GRAM STAIN (SURGICAL/DEEP WOUND)

## 2024-09-02 LAB — BASIC METABOLIC PANEL WITH GFR
Anion gap: 10 (ref 5–15)
BUN: 5 mg/dL — ABNORMAL LOW (ref 8–23)
CO2: 21 mmol/L — ABNORMAL LOW (ref 22–32)
Calcium: 8.9 mg/dL (ref 8.9–10.3)
Chloride: 108 mmol/L (ref 98–111)
Creatinine, Ser: 0.62 mg/dL (ref 0.44–1.00)
GFR, Estimated: >60 mL/min
Glucose, Bld: 87 mg/dL (ref 70–99)
Potassium: 4.3 mmol/L (ref 3.5–5.1)
Sodium: 139 mmol/L (ref 135–145)

## 2024-09-02 LAB — FISH HES LEUKEMIA, 4Q12 REA

## 2024-09-02 LAB — CULTURE, BLOOD (ROUTINE X 2)
Culture: NO GROWTH
Culture: NO GROWTH

## 2024-09-02 LAB — MAGNESIUM: Magnesium: 1.8 mg/dL (ref 1.7–2.4)

## 2024-09-02 MED ORDER — OXYCODONE HCL 5 MG PO TABS
5.0000 mg | ORAL_TABLET | ORAL | Status: DC | PRN
  Administered 2024-09-02 – 2024-09-04 (×10): 5 mg via ORAL
  Filled 2024-09-02 (×10): qty 1

## 2024-09-02 MED ORDER — TRAZODONE HCL 50 MG PO TABS
50.0000 mg | ORAL_TABLET | Freq: Every day | ORAL | Status: DC
  Administered 2024-09-02 – 2024-09-07 (×5): 50 mg via ORAL
  Filled 2024-09-02 (×6): qty 1

## 2024-09-02 MED ORDER — HYDROMORPHONE HCL 1 MG/ML IJ SOLN
1.0000 mg | INTRAMUSCULAR | Status: DC | PRN
  Administered 2024-09-02 – 2024-09-04 (×12): 1 mg via INTRAVENOUS
  Filled 2024-09-02 (×12): qty 1

## 2024-09-02 NOTE — Progress Notes (Signed)
 "        [facility]  Date of Admission:  08/27/2024      Total days of antibiotics 6    ASSESSMENT: Jennifer [name] is a 64 y.o. female admitted with:   Gastric Antral Ulcer -  Contained Perforation -  Large Upper Abdominal / Subcapsular Liver Abscess -  She is S/P Perc Drain 1/18 with only lactobacillus growing on culture. Polymicrobial gram stain. Narrowed to unasyn  for coverage.  She is on the way to Endo for upper GI study.  She is quite unhappy about persistent withholding of food.  They are still monitoring to general surgery's perspective to ensure there is no surgical indication for management here.  - Continue Unasyn  + micafungin  - Treatment duration TBD - D/W gen surgery team and may consider injection into drain to determine if persistent fistula.   Right sided pleural effusion status post thoracentesis  - Suspect this is translocation from subscapular liver fluid collection.  Protein in this fluid was 3.9 indicating exudative process.  LDH from this fluid 216. Last chest x-ray on the 19th indicated small right pleural effusions with a larger on the right base bibasilar opacity.  I suspect this could also be a process related to chronic aspiration as well - Continue antibiotics  Substance Use History -  Poor Health Literacy / Insight -  Hx of ETOH use and polysubstance use. Previously left AMA due to frustration over NPO status.   Esophageal Candidiasis -  Plaques observed on EGD recently. Continue micafungin  to treat this. Needs 3 weeks total antifungals. Not clear she took this prior to admission.   - continue micafungin    Precautions Recommended:  Standard / universal    PLAN: Continue unasyn  IV - duration pending further drain studies  Continue micafungin   Follow pending micro information, drain output and FU imaging.  I do not feel she would be a safe PICC line / OPAT candidate   Dr. Dennise is on call this weekend for ID  pertinent questions.   Principal Problem:   GI bleed Active Problems:   Substance abuse (HCC)   Essential hypertension   Candidiasis of esophagus (HCC)   Hepatic abscess   Pleural effusion on right   History of GI bleed   History of alcohol use   Hypokalemia   Liver abscess   Gastric perforation (HCC)   Fistula   Esophageal candidiasis (HCC)   Polysubstance abuse (HCC)   Melena   Gastric fistula    feeding supplement  1 Container Oral TID BM   pantoprazole  (PROTONIX ) IV  40 mg Intravenous Q12H   sodium chloride  flush  3 mL Intravenous Q12H   sodium chloride  flush  3 mL Intravenous Q12H   sodium chloride  flush  5 mL Intracatheter Q8H   traZODone   50 mg Oral QHS    SUBJECTIVE: She is unhappy about NPO status   Review of Systems: Review of Systems  Constitutional:  Negative for chills and fever.  HENT:  Negative for tinnitus.   Eyes:  Negative for blurred vision and photophobia.  Respiratory:  Negative for cough and sputum production.   Cardiovascular:  Negative for chest pain.  Gastrointestinal:  Positive for abdominal pain. Negative for diarrhea, nausea and vomiting.  Genitourinary:  Negative for dysuria.  Skin:  Negative for rash.  Neurological:  Negative for headaches.    Allergies[1]  OBJECTIVE: Vitals:   09/01/24 1131 09/01/24 2015 09/02/24 0740 09/02/24 1145  BP: 135/83 (!) 136/91 ROLLEN)  145/87 (!) 140/85  Pulse: 84 80 74 72  Resp:  17 20 20   Temp: 98.5 F (36.9 C) 98.9 F (37.2 C) 98.7 F (37.1 C) 98.5 F (36.9 C)  TempSrc: Oral Oral Oral Axillary  SpO2:    92%  Weight:      Height:       Body mass index is 30.9 kg/m.  Physical Exam Vitals reviewed.  Constitutional:      Appearance: Normal appearance. She is not ill-appearing.  HENT:     Mouth/Throat:     Mouth: Mucous membranes are moist.     Pharynx: Oropharynx is clear.  Eyes:     General: No scleral icterus. Cardiovascular:     Rate and Rhythm: Normal rate.  Pulmonary:     Effort:  Pulmonary effort is normal.  Neurological:     Mental Status: She is oriented to person, place, and time.  Psychiatric:        Mood and Affect: Mood normal.        Thought Content: Thought content normal.     Lab Results Lab Results  Component Value Date   WBC 4.8 09/02/2024   HGB 9.1 (L) 09/02/2024   HCT 28.3 (L) 09/02/2024   MCV 90.1 09/02/2024   PLT 388 09/02/2024    Lab Results  Component Value Date   CREATININE 0.62 09/02/2024   BUN 5 (L) 09/02/2024   NA 139 09/02/2024   K 4.3 09/02/2024   CL 108 09/02/2024   CO2 21 (L) 09/02/2024    Lab Results  Component Value Date   ALT 29 09/01/2024   AST 28 09/01/2024   ALKPHOS 102 09/01/2024   BILITOT 0.3 09/01/2024     Microbiology: Recent Results (from the past 240 hours)  Culture, blood (Routine X 2) w Reflex to ID Panel     Status: None   Collection Time: 08/28/24  1:09 AM   Specimen: BLOOD  Result Value Ref Range Status   Specimen Description BLOOD SITE NOT SPECIFIED  Final   Special Requests   Final    BOTTLES DRAWN AEROBIC AND ANAEROBIC Blood Culture results may not be optimal due to an inadequate volume of blood received in culture bottles   Culture   Final    NO GROWTH 5 DAYS Performed at [facility], [address] [place], [place] [address]    Report Status 09/02/2024 FINAL  Final  Body fluid culture w Gram Stain     Status: None   Collection Time: 08/28/24  7:44 AM   Specimen: Pleura; Body Fluid  Result Value Ref Range Status   Specimen Description PLEURAL  Final   Special Requests NONE  Final   Gram Stain RARE WBC SEEN NO ORGANISMS SEEN   Final   Culture   Final    NO GROWTH 3 DAYS Performed at [facility], [address], [place], [place] [address]    Report Status 08/31/2024 FINAL  Final  Culture, blood (Routine X 2) w Reflex to ID Panel     Status: None   Collection Time: 08/28/24 12:41 PM   Specimen: BLOOD RIGHT HAND  Result Value Ref Range Status   Specimen Description  BLOOD RIGHT HAND  Final   Special Requests   Final    BOTTLES DRAWN AEROBIC ONLY Blood Culture results may not be optimal due to an inadequate volume of blood received in culture bottles   Culture   Final    NO GROWTH  5 DAYS Performed at [facility], [address], [place], [place] [address]    Report Status 09/02/2024 FINAL  Final  Aerobic/Anaerobic Culture w Gram Stain (surgical/deep wound)     Status: None   Collection Time: 08/28/24  3:57 PM   Specimen: Abscess  Result Value Ref Range Status   Specimen Description ABSCESS  Final   Special Requests ABDOMEN  Final   Gram Stain   Final    MODERATE WBC PRESENT, PREDOMINANTLY PMN ABUNDANT GRAM POSITIVE COCCI MODERATE GRAM NEGATIVE RODS ABUNDANT GRAM POSITIVE RODS RARE YEAST    Culture   Final    RARE LACTOBACILLUS SPECIES Standardized susceptibility testing for this organism is not available. NO ANAEROBES ISOLATED Performed at [facility], [address] [place], [place] [address]    Report Status 09/02/2024 FINAL  Final     [name], MSN, NP-C [facility]  [email or web address] Pager: [phone] Office: [phone] [facility] Main Line: [phone] *Secure Chat Communication Welcome   Time spent: 15 min     [1] No Known Allergies  "

## 2024-09-02 NOTE — Progress Notes (Signed)
 "   Referring Physician(s): Stechschulte, Deward  Supervising Physician: Vanice Revel  Patient Status:  [facility] - In-pt  Chief Complaint:   Perihepatic abscess s/p drain placement on 08/28/24 by Dr. Jennefer.   Subjective:  Patient lying in bed, alert and oriented. She complains of ongoing mild, right upper abdominal pain today.  Allergies: Patient has no known allergies.  Medications: Prior to Admission medications  Medication Sig Start Date End Date Taking? Authorizing Provider  [Paused] amLODipine  (NORVASC ) 10 MG tablet TAKE 1 TABLET (10 MG TOTAL) BY MOUTH DAILY (Office visit for future refills) Wait to take this until your doctor or other care provider tells you to start again. 07/14/24   Newlin, Enobong, MD  atorvastatin  (LIPITOR) 10 MG tablet Take 1 tablet (10 mg total) by mouth daily. Patient not taking: Reported on 08/28/2024 07/14/24   Newlin, Enobong, MD  Blood Pressure Monitoring (BLOOD PRESSURE CUFF) MISC 1 Units by Does not apply route daily. 05/24/24   Mayers, Cari S, PA-C  dicyclomine  (BENTYL ) 20 MG tablet Take 1 tablet (20 mg total) by mouth 3 (three) times daily as needed for spasms. Patient not taking: Reported on 08/28/2024 08/22/24 09/21/24  Christobal Guadalajara, MD  folic acid  (FOLVITE ) 1 MG tablet Take 1 tablet (1 mg total) by mouth daily. Patient not taking: Reported on 08/28/2024 08/22/24 09/21/24  Christobal Guadalajara, MD  [Paused] lisinopril -hydrochlorothiazide  (ZESTORETIC ) 20-12.5 MG tablet Take 2 tablets by mouth daily. Wait to take this until your doctor or other care provider tells you to start again. 07/14/24   Newlin, Enobong, MD  metoprolol  succinate (TOPROL -XL) 50 MG 24 hr tablet Take 1 tablet (50 mg total) by mouth daily. Patient not taking: Reported on 08/28/2024 07/14/24   Newlin, Enobong, MD  oxyCODONE -acetaminophen  (PERCOCET/ROXICET) 5-325 MG tablet Take 1 tablet by mouth every 6 (six) hours as needed for moderate pain (pain score 4-6). Patient not taking: Reported on 08/28/2024  08/22/24   Christobal Guadalajara, MD  pantoprazole  (PROTONIX ) 40 MG tablet Take 1 tablet (40 mg total) by mouth 2 (two) times daily. Patient not taking: Reported on 08/28/2024 08/22/24 09/21/24  Christobal Guadalajara, MD  thiamine  (VITAMIN B-1) 100 MG tablet Take 1 tablet (100 mg total) by mouth daily. Patient not taking: Reported on 08/28/2024 08/22/24 09/21/24  Christobal Guadalajara, MD     Vital Signs: BP (!) 140/85 (BP Location: Left Arm)   Pulse 72   Temp 98.5 F (36.9 C) (Axillary)   Resp 20   Ht 5' 4 (1.626 m)   Wt 180 lb (81.6 kg)   LMP 12/04/2009   SpO2 92%   BMI 30.90 kg/m   Physical Exam Constitutional:      General: She is not in acute distress.    Appearance: Normal appearance.  Abdominal:     Palpations: Abdomen is soft.     Tenderness: There is abdominal tenderness.     Comments: RUQ drain, stitch and stat lock intact. Dressing clear of any fluid or drainage. Aspirates easily with flush. Green, purulent drainage in bulb.   Skin:    General: Skin is warm and dry.     Findings: No erythema.  Neurological:     Mental Status: She is alert.    Imaging: DG UGI W SINGLE CM (SOL OR THIN BA) Result Date: 09/01/2024 CLINICAL DATA:  64 year old female with history of perforated gastric antrum ulcer with fistulous connection to hepatic abscess. Abscess drain placed on 08/28/2024 by IR. EXAM: DG UGI W SINGLE CM TECHNIQUE: Scout radiograph  was obtained. Single contrast examination was performed using Omnipaque  300. This exam was performed by Rayfield Buff, NP, and was supervised and interpreted by Dr. Rockey Kilts. FLUOROSCOPY: Radiation Exposure Index (as provided by the fluoroscopic device): 52.6 mGy Kerma COMPARISON:  CT abdomen pelvis with contrast 08/27/2024 FINDINGS: Limited exam secondary to patient immobility. Scout Radiograph: Pre-procedure scout film demonstrates a drain in the right upper quadrant. Esophagus: Markedly dilated esophagus. Filling defects within may represent debris or residual food.  Esophageal motility: Esophageal stasis with markedly delayed passage of contrast from the esophagus into the stomach. Gastric emptying: Delayed. Duodenum: Slow transit into the duodenum. Secondary to delayed contrast transit from the esophagus, the stomach is suboptimally distended. The gastric antrum is poorly opacified and the proximal duodenum is never well opacified. IMPRESSION: 1. Limited exam secondary to patient immobility. 2. Marked esophageal distension with severe esophageal dysmotility. Findings suggest achalasia. 3. Esophageal dilatation and poor esophageal emptying make opacification of the distal stomach poor. Therefore, evaluation for residual or recurrent leak is suboptimal. The patient may benefit from nasogastric tube placement with repeat upper GI via the nasogastric tube. Electronically Signed   By: Rockey Kilts M.D.   On: 09/01/2024 12:51   DG CHEST PORT 1 VIEW Result Date: 08/29/2024 EXAM: 1 VIEW(S) XRAY OF THE CHEST 08/29/2024 09:38:00 PM COMPARISON: 08/28/2024 CLINICAL HISTORY: SOB (shortness of breath) FINDINGS: LUNGS AND PLEURA: There is a small right pleural effusion. There are bibasilar opacities, right greater than left, which may represent atelectasis. No pneumothorax. HEART AND MEDIASTINUM: Cardiomegaly. The heart is enlarged. BONES AND SOFT TISSUES: No acute osseous abnormality. IMPRESSION: 1. Cardiomegaly. 2. Small right pleural effusion. 3. Bibasilar opacities, right greater than left, which may represent atelectasis. Electronically signed by: Greig Pique MD 08/29/2024 09:44 PM EST RP Workstation: [id number]    Labs:  CBC: Recent Labs    08/30/24 0217 08/31/24 0350 09/01/24 0238 09/02/24 0544  WBC 5.9 5.0 6.3 4.8  HGB 8.9* 9.0* 8.7* 9.1*  HCT 27.5* 28.1* 27.5* 28.3*  PLT 572* 593* 614* 388    COAGS: Recent Labs    08/13/24 2127 08/28/24 0848  INR 1.0 1.2    BMP: Recent Labs    08/30/24 0217 08/31/24 0350 09/01/24 0238 09/02/24 0544  NA 138 140  144 139  K 4.1 3.8 3.9 4.3  CL 106 107 108 108  CO2 23 26 26  21*  GLUCOSE 92 90 94 87  BUN 7* 6* 6* 5*  CALCIUM  8.8* 8.8* 9.0 8.9  CREATININE 0.71 0.65 0.67 0.62  GFRNONAA >60 >60 >60 >60    LIVER FUNCTION TESTS: Recent Labs    08/27/24 1821 08/28/24 0500 08/31/24 0350 09/01/24 0238  BILITOT 0.3 0.3 0.3 0.3  AST 31 32 20 28  ALT 46* 42 27 29  ALKPHOS 128* 129* 101 102  PROT 6.7 7.0 6.4* 6.7  ALBUMIN 2.4* 2.5* 2.3* 2.5*    Assessment and Plan:  Drain Location: RUQ Size: Fr size: 10 Fr Date of placement: 08/28/2024  Currently to: Drain collection device: suction bulb 24 hour output:  Output by Drain (mL) 08/31/24 0701 - 08/31/24 1900 08/31/24 1901 - 09/01/24 0700 09/01/24 0701 - 09/01/24 1900 09/01/24 1901 - 09/02/24 0700 09/02/24 0701 - 09/02/24 1326  Closed System Drain 1 Right RLQ Bulb (JP) 10 Fr.  30 50 30     Current examination: Flushes/aspirates easily.  Insertion site unremarkable. Suture and stat lock in place. Dressed appropriately.   Plan: Continue TID flushes with 5 cc NS. Record  output Q shift. Dressing changes QD or PRN if soiled.  Call IR APP or on call IR MD if difficulty flushing or sudden change in drain output.  Repeat imaging/possible drain injection once output < 10 mL/QD (excluding flush material). Consideration for drain removal if output is < 10 mL/QD (excluding flush material), pending discussion with the providing surgical service.  Discharge planning: Please contact IR APP or on call IR MD prior to patient d/c to ensure appropriate follow up plans are in place. Typically patient will follow up with IR clinic 10-14 days post d/c for repeat imaging/possible drain injection. IR scheduler will contact patient with date/time of appointment. Patient will need to flush drain QD with 5 cc NS, record output QD, dressing changes every 2-3 days or earlier if soiled.   IR will continue to follow - please call with questions or  concerns.  Electronically Signed: Greig FORBES Jasmine, PA-C 09/02/2024, 1:01 PM   I spent a total of 15 Minutes at the the patient's bedside AND on the patient's hospital floor or unit, greater than 50% of which was counseling/coordinating care for perihepatic abscess s/p drain placement.      "

## 2024-09-02 NOTE — Progress Notes (Signed)
 "      Subjective: No complaints. Tolerating clears. Denies nausea/vomiting. Reports two BMs.   Objective: Vital signs in last 24 hours: Temp:  [98.5 F (36.9 C)-98.9 F (37.2 C)] 98.7 F (37.1 C) (01/23 0740) Pulse Rate:  [74-84] 74 (01/23 0740) Resp:  [17-20] 20 (01/23 0740) BP: (135-145)/(83-91) 145/87 (01/23 0740) Last BM Date : 09/02/24  Intake/Output from previous day: 01/22 0701 - 01/23 0700 In: 2393.3 [P.O.:960; I.V.:1000; IV Piggyback:423.3] Out: 80 [Drains:80] Intake/Output this shift: No intake/output data recorded.  PE: General: resting comfortably, NAD  Neuro: alert and oriented, no focal deficits Resp: normal work of breathing on room air CV: RRR Abdomen: soft, there is epigastric fullness, overall non-tender. RUQ drain with purulent fluid (80 cc documented in epic), non-bilious.  Extremities: warm and well-perfused  Lab Results:  Recent Labs    09/01/24 0238 09/02/24 0544  WBC 6.3 4.8  HGB 8.7* 9.1*  HCT 27.5* 28.3*  PLT 614* 388   BMET Recent Labs    09/01/24 0238 09/02/24 0544  NA 144 139  K 3.9 4.3  CL 108 108  CO2 26 21*  GLUCOSE 94 87  BUN 6* 5*  CREATININE 0.67 0.62  CALCIUM  9.0 8.9   PT/INR No results for input(s): LABPROT, INR in the last 72 hours.  CMP     Component Value Date/Time   NA 139 09/02/2024 0544   NA 141 05/24/2024 1310   K 4.3 09/02/2024 0544   CL 108 09/02/2024 0544   CO2 21 (L) 09/02/2024 0544   GLUCOSE 87 09/02/2024 0544   BUN 5 (L) 09/02/2024 0544   BUN 19 05/24/2024 1310   CREATININE 0.62 09/02/2024 0544   CREATININE 1.15 (H) 09/30/2016 1017   CALCIUM  8.9 09/02/2024 0544   PROT 6.7 09/01/2024 0238   PROT 7.2 05/24/2024 1310   ALBUMIN 2.5 (L) 09/01/2024 0238   ALBUMIN 4.3 05/24/2024 1310   AST 28 09/01/2024 0238   ALT 29 09/01/2024 0238   ALKPHOS 102 09/01/2024 0238   BILITOT 0.3 09/01/2024 0238   BILITOT 0.2 05/24/2024 1310   GFRNONAA >60 09/02/2024 0544   GFRNONAA 54 (L) 09/30/2016 1017    GFRAA >60 03/28/2019 1205   GFRAA 63 09/30/2016 1017   Lipase     Component Value Date/Time   LIPASE 49 08/27/2024 1821       Studies/Results: DG UGI W SINGLE CM (SOL OR THIN BA) Result Date: 09/01/2024 CLINICAL DATA:  65 year old female with history of perforated gastric antrum ulcer with fistulous connection to hepatic abscess. Abscess drain placed on 08/28/2024 by IR. EXAM: DG UGI W SINGLE CM TECHNIQUE: Scout radiograph was obtained. Single contrast examination was performed using Omnipaque  300. This exam was performed by Rayfield Buff, NP, and was supervised and interpreted by Dr. Rockey Kilts. FLUOROSCOPY: Radiation Exposure Index (as provided by the fluoroscopic device): 52.6 mGy Kerma COMPARISON:  CT abdomen pelvis with contrast 08/27/2024 FINDINGS: Limited exam secondary to patient immobility. Scout Radiograph: Pre-procedure scout film demonstrates a drain in the right upper quadrant. Esophagus: Markedly dilated esophagus. Filling defects within may represent debris or residual food. Esophageal motility: Esophageal stasis with markedly delayed passage of contrast from the esophagus into the stomach. Gastric emptying: Delayed. Duodenum: Slow transit into the duodenum. Secondary to delayed contrast transit from the esophagus, the stomach is suboptimally distended. The gastric antrum is poorly opacified and the proximal duodenum is never well opacified. IMPRESSION: 1. Limited exam secondary to patient immobility. 2. Marked esophageal distension with  severe esophageal dysmotility. Findings suggest achalasia. 3. Esophageal dilatation and poor esophageal emptying make opacification of the distal stomach poor. Therefore, evaluation for residual or recurrent leak is suboptimal. The patient may benefit from nasogastric tube placement with repeat upper GI via the nasogastric tube. Electronically Signed   By: Rockey Kilts M.D.   On: 09/01/2024 12:51     Anti-infectives: Anti-infectives (From  admission, onward)    Start     Dose/Rate Route Frequency Ordered Stop   08/30/24 1245  Ampicillin -Sulbactam (UNASYN ) 3 g in sodium chloride  0.9 % 100 mL IVPB        3 g 200 mL/hr over 30 Minutes Intravenous Every 6 hours 08/30/24 1151     08/29/24 0815  micafungin  (MYCAMINE ) 100 mg in sodium chloride  0.9 % 100 mL IVPB        100 mg 105 mL/hr over 1 Hours Intravenous Every 24 hours 08/29/24 0807     08/28/24 1200  vancomycin  (VANCOREADY) IVPB 750 mg/150 mL  Status:  Discontinued        750 mg 150 mL/hr over 60 Minutes Intravenous Every 12 hours 08/27/24 2352 08/29/24 0926   08/28/24 1000  fluconazole  (DIFLUCAN ) tablet 400 mg        400 mg Oral Daily 08/27/24 2351 08/28/24 0928   08/28/24 0400  piperacillin -tazobactam (ZOSYN ) IVPB 3.375 g  Status:  Discontinued        3.375 g 12.5 mL/hr over 240 Minutes Intravenous Every 8 hours 08/27/24 2352 08/30/24 1150   08/27/24 2345  vancomycin  (VANCOREADY) IVPB 1750 mg/350 mL        1,750 mg 175 mL/hr over 120 Minutes Intravenous  Once 08/27/24 2343 08/28/24 0706   08/27/24 2245  piperacillin -tazobactam (ZOSYN ) IVPB 3.375 g        3.375 g 100 mL/hr over 30 Minutes Intravenous  Once 08/27/24 2239 08/27/24 2325        Assessment/Plan 64 yo female with a known gastric antral ulcer, presenting with acute abdominal pain and a large upper abdominal abscess, consistent with a contained perforation. S/p percutaneous drain placement 1/18. - no fever, clinically stable with a benign abdominal exam - UGI study 1/22 limited, suggests possible esophageal dilation but no apparent leak - tolerating clears with stable drain output, allow FLD. - Abx per ID - Continue IV PPI and antibiotics - H pylori stool antigen - If patient clinically deteriorates, will need surgical exploration     LOS: 6 days   Almarie Pringle, [facility] Please see Amion for pager number during day hours 7:00am-4:30pm      09/02/24 10:42 AM  "

## 2024-09-02 NOTE — TOC Progression Note (Signed)
 Transition of Care [facility]) - Progression Note    Patient Details  Name: Jennifer [name] MRN: [id number] Date of Birth: [birth year]  [facility]) CM/SW Contact  Landry DELENA Senters, RN Phone Number: 09/02/2024, 1:01 PM  Clinical Narrative:     Plan for advancing PO diet.   Continued medical workup.  CM will continue to follow.  Expected Discharge Plan: Home/Self Care Barriers to Discharge: Continued Medical Work up               Expected Discharge Plan and Services       Living arrangements for the past 2 months: Single Family Home                                       Social Drivers of Health (SDOH) Interventions SDOH Screenings   Food Insecurity: No Food Insecurity (08/29/2024)  Housing: Low Risk (08/29/2024)  Transportation Needs: No Transportation Needs (08/29/2024)  Recent Concern: Transportation Needs - Unmet Transportation Needs (08/14/2024)  Utilities: Not At Risk (08/29/2024)  Depression (PHQ2-9): Low Risk (05/24/2024)  Recent Concern: Depression (PHQ2-9) - Medium Risk (03/04/2024)  Tobacco Use: High Risk (08/28/2024)    Readmission Risk Interventions     No data to display

## 2024-09-02 NOTE — Progress Notes (Signed)
 [facility] Progress Note Patient: Jennifer [name] FMW:[id number] DOB: 05/07/1961  DOA: 08/27/2024 DOS: the patient was seen and examined on 09/02/2024  Brief Summary: Patient is a 64 y.o.  female with history of HTN, EtOH use, substance abuse (prior UDS positive for opiates/cocaine/benzos)-who recently had a admission for melena-EGD showed gastric ulcer-patient subsequently left AMA-presented with generalized weakness/melanotic stools/upper abdominal pain-she was found to have right hepatic abscess with fistulous communication to a contained gastric perforation.   Significant events: 1/17>> admit to [facility] 1/22>> underwent upper GI series.  Diet advanced.   Significant studies: 1/17>> CT abdomen/pelvis: Large right subhepatic subcapsular abscess with fistulous communication to the gastric antrum.  New moderate-sized right pleural effusion.  1/22>> upper GI series esophageal distention with severe esophageal dysmotility   Significant microbiology data: 1/18>> blood culture: No growth 1/18>> pleural fluid culture: No growth 1/18>> right hepatic abscess culture: Lactobacillus/rare yeast   Procedures: 1/18>> CT-guided right perihepatic abscess drain placement 1/18>> thoracocentesis by IR   Consults: [facility] General surgery ID   Assessment and Plan: Right hepatic abscess secondary to contained gastric perforation with fistulous communication Contained gastric perforation with fistulous communication with right hepatic abscess S/p CT-guided drain placement by IR on 1/18 On Zosyn /micafungin  Appreciate ID managing antibiotics. Appreciate General surgery and Gastroenterology consult. Upper GI series shows esophageal distention concerning for achalasia and no evidence of leakage of contrast. Advancing to clear liquid diet. Currently on IV Unasyn .  And micafungin .  Not recommended for home IV antibiotics.  Will need to confirm duration with ID prior to discharge.  Recent upper GI  bleed (EGD on 1/3 showed candidiasis and gastric ulcer) Completed a course of fluconazole  On PPI twice daily-6-8 weeks   Right-sided pleural effusion S/p thoracocentesis on 1/18 Exudative by lights criteria-however cultures negative This is felt to be sympathetic effusion to hepatic abscess On empiric antibiotics as above   HTN BP stable-not on any antihypertensives   EtOH use Apparently has quit drinking-no withdrawal symptoms   History of substance abuse (UDS positive for benzo/opiates/cocaine on 1/4) Will counsel the patient prior to discharge   Noncompliance Patient threatening to leave AMA if not allowed to eat. Multiple providers have counseled her again and again regarding the importance of current management with being n.p.o. status. Multiple providers including myself have advised her of the life-threatening and life disabling risk of leaving AGAINST MEDICAL ADVICE.   Class 1 Obesity: Body mass index is 30.9 kg/m.  Placing the patient at high risk for him.   Normocytic acute on chronic blood loss anemia. Hemoglobin appears to be stable around 9.  Acute pain control. Patient reports severe pain in her abdomen. Utilizing multiple rounds of IV Dilaudid  overnight. At present given that the patient will be able to tolerate oral diet, suspect patient will be also able to tolerate oral medication.  Placing her on oral Oxy IR as well as IV Dilaudid .     Code Status: Full Code   DVT Prophylaxis: SCDs Start: 08/27/24 2352 Place TED hose Start: 08/27/24 2352   Data review I have Reviewed nursing notes, Vitals, and Lab results. Since last encounter, pertinent lab results CBC and BMP   . I have ordered test including CBC  .   Physical exam. Vitals:   09/01/24 1131 09/01/24 2015 09/02/24 0740 09/02/24 1145  BP: 135/83 (!) 136/91 (!) 145/87 (!) 140/85  Pulse: 84 80 74 72  Resp:  17 20 20   Temp: 98.5 F (36.9 C) 98.9 F (37.2 C)  98.7 F (37.1 C) 98.5 F (36.9 C)   TempSrc: Oral Oral Oral Axillary  SpO2:    92%  Weight:      Height:        General: in Mild distress, No Rash Cardiovascular: S1 and S2 Present, No Murmur Respiratory: Good respiratory effort, Bilateral Air entry present. No Crackles, No wheezes Abdomen: Bowel Sound present, mild Fuhs tenderness Extremities: No edema Neuro: Alert and oriented x3, no new focal deficit   Subjective: No nausea or vomiting.  Reports that the pain is well-controlled.  No fever no chills.  Had a 2 BM.  Family Communication: No one at bedside  Disposition Plan: Status is: Inpatient Remains inpatient appropriate because: Monitor for improvement and tolerance   Planned Discharge Destination:Home Diet: Diet Order             Diet full liquid Room service appropriate? Yes; Fluid consistency: Thin  Diet effective now                   MEDICATIONS: Scheduled Meds:  feeding supplement  1 Container Oral TID BM   pantoprazole  (PROTONIX ) IV  40 mg Intravenous Q12H   sodium chloride  flush  3 mL Intravenous Q12H   sodium chloride  flush  3 mL Intravenous Q12H   sodium chloride  flush  5 mL Intracatheter Q8H   traZODone   50 mg Oral QHS   Continuous Infusions:  ampicillin -sulbactam (UNASYN ) IV 3 g (09/02/24 1315)   micafungin  (MYCAMINE ) 100 mg in sodium chloride  0.9 % 100 mL IVPB 100 mg (09/02/24 1143)   PRN Meds:.acetaminophen  **OR** acetaminophen , HYDROmorphone  (DILAUDID ) injection, LORazepam , oxyCODONE , sodium chloride  flush  Author: Yetta Blanch, MD  [facility] 09/02/2024  6:11 PM Between 7PM-7AM, please contact night-coverage, check [email or web address] for on call.

## 2024-09-02 NOTE — Progress Notes (Signed)
 Nutrition Follow-up  DOCUMENTATION CODES:  Obesity unspecified  INTERVENTION:  Continue Boost Breeze po TID, each supplement provides 250 kcal and 9 grams of protein Add Magic cup TID with meals, each supplement provides 290 kcal and 9 grams of protein No need for TPN at this time since patient is tolerating full liquids; will need to re-consider TPN if patient requires prolonged NPO/clear liquids.   NUTRITION DIAGNOSIS:  Inadequate oral intake related to altered GI function as evidenced by NPO status; ongoing, diet has been advanced to full liquids  GOAL:  Patient will meet greater than or equal to 90% of their needs; progressing  MONITOR:  PO intake, Supplement acceptance, Diet advancement  REASON FOR ASSESSMENT:  Consult Assessment of nutrition requirement/status  ASSESSMENT:  64 y.o. female with medical history significant of history of GI bleed (recent admission 1/3 for melena-EGD showed esophageal plaques suspicious for candidiasis nonbleeding gastric ulcer and gastritis-patient left AMA before completion of the treatment), essential hypertension, polysubstance abuse disorder, tobacco use disorder, thrombocytopenia, essential hypertension, physical deconditioning/debility and weakness, and morbid obesity presented complaining of black-colored stool that started 1/17/26and patient also has generalized weakness.  S/P UGI 1/22 limited, but no apparent leak. Diet has been advanced to full liquids.  Plans to repeat EGD in 2 months to evaluate healing of gastric ulcer.  Patient reports that she is glad to be on full liquids, but would like to have some chicken. Explained that she can only have full liquids for now. She likes the [employer], encouraged her to drink TID. She does not want Ensure because she says it will run right through me. Will try Magic cup supplements on meal trays to maximize protein and calorie intake.   Drain output: 80 ml documented x 24 h  Admit / Current  weight: 81.6 kg (1/17)  Nutritionally Relevant Medications: Scheduled Meds:  feeding supplement  1 Container Oral TID BM   pantoprazole  (PROTONIX ) IV  40 mg Intravenous Q12H   sodium chloride  flush  3 mL Intravenous Q12H   sodium chloride  flush  3 mL Intravenous Q12H   sodium chloride  flush  5 mL Intracatheter Q8H   traZODone   50 mg Oral QHS   Continuous Infusions:  ampicillin -sulbactam (UNASYN ) IV 3 g (09/02/24 1315)   micafungin  (MYCAMINE ) 100 mg in sodium chloride  0.9 % 100 mL IVPB 100 mg (09/02/24 1143)   PRN Meds:.acetaminophen  **OR** acetaminophen , HYDROmorphone  (DILAUDID ) injection, LORazepam , oxyCODONE , sodium chloride  flush  Labs Reviewed.  NUTRITION - FOCUSED PHYSICAL EXAM: Flowsheet Row Most Recent Value  Orbital Region No depletion  Upper Arm Region No depletion  Thoracic and Lumbar Region Mild depletion  Buccal Region Moderate depletion  Temple Region No depletion  Clavicle Bone Region Mild depletion  Clavicle and Acromion Bone Region No depletion  Scapular Bone Region No depletion  Dorsal Hand No depletion  Patellar Region Unable to assess  Anterior Thigh Region Unable to assess  Posterior Calf Region Unable to assess  Edema (RD Assessment) Unable to assess  Hair Reviewed  Eyes Reviewed  Mouth Reviewed  Skin Reviewed  Nails Reviewed   Diet Order:   Diet Order             Diet full liquid Room service appropriate? Yes; Fluid consistency: Thin  Diet effective now                  EDUCATION NEEDS:  Not appropriate for education at this time  Skin:  Skin Assessment: Reviewed RN Assessment  Last BM:  1/23 type 5  Height:  Ht Readings from Last 1 Encounters:  08/27/24 5' 4 (1.626 m)   Weight:  Wt Readings from Last 1 Encounters:  08/27/24 81.6 kg   Ideal Body Weight:  54.5 kg  BMI:  Body mass index is 30.9 kg/m.  Estimated Nutritional Needs:   Kcal:  1700-1900  Protein:  90-105 grams  Fluid:  1.7-1.9 L   Suzen HUNT RD, LDN,  CNSC Contact via secure chat. If unavailable, use group chat RD Inpatient.

## 2024-09-02 NOTE — Progress Notes (Signed)
 [facility] Progress Note  SUBJECTIVE:   Interval history: Jennifer [name] was seen and evaluated today at bedside. Resting in bed. Tolerating clear liquids. Discussed that I do not suspect she has achalasia (concerns for this on upper GI series) given that she had EGD completed earlier this month which did not show suggestive findings. Requesting to advance diet. Abdominal pain 8/10 intensity. Still with drain in place, output 80 cc 1/22, output 30 cc 1/21, purulent. Having brown bowel movements.  Past Medical History:  Diagnosis Date   bipolar    Bipolar 1 disorder (HCC)    Depression    Fibroids    now s/p hysterectomy   History of alcohol use 08/27/2024   Hypertension    Left ventricular hypertrophy    Polysubstance abuse (HCC)    IV drug us , cocaine on occassion, smoking and alcoholism, has been going to AA, not completely sober   Past Surgical History:  Procedure Laterality Date   ABDOMINAL HYSTERECTOMY     2003   BILATERAL SALPINGOOPHORECTOMY     01/2010   BONE BIOPSY  08/13/2024   Procedure: BIOPSY, GI;  Surgeon: Dianna Specking, MD;  Location: [facility];  Service: Gastroenterology;;   DILATION AND CURETTAGE OF UTERUS     ESOPHAGOGASTRODUODENOSCOPY N/A 08/13/2024   Procedure: EGD (ESOPHAGOGASTRODUODENOSCOPY);  Surgeon: Dianna Specking, MD;  Location: [facility];  Service: Gastroenterology;  Laterality: N/A;   EXPLORATORY LAPAROTOMY     complex pelvic mass 2011   TONSILLECTOMY     Current Facility-Administered Medications  Medication Dose Route Frequency Provider Last Rate Last Admin   0.9 %  sodium chloride  infusion (Manually program via Guardrails IV Fluids)   Intravenous Once Sundil, Subrina, MD   Held at 08/28/24 0147   acetaminophen  (TYLENOL ) tablet 650 mg  650 mg Oral Q6H PRN Sundil, Subrina, MD   650 mg at 09/01/24 2013   Or   acetaminophen  (TYLENOL ) suppository 650 mg  650 mg Rectal Q6H PRN Sundil, Subrina, MD   650 mg at 08/30/24 1042    Ampicillin -Sulbactam (UNASYN ) 3 g in sodium chloride  0.9 % 100 mL IVPB  3 g Intravenous Q6H Melvenia Corean SAILOR, NP 200 mL/hr at 09/02/24 0549 3 g at 09/02/24 0549   dextrose  5 % in lactated ringers  infusion   Intravenous Continuous Patsy Lenis, MD   Stopped at 09/01/24 0855   feeding supplement (BOOST / RESOURCE BREEZE) liquid 1 Container  1 Container Oral TID BM Patel, Pranav M, MD   1 Container at 09/02/24 0901   HYDROmorphone  (DILAUDID ) injection 0.5-1 mg  0.5-1 mg Intravenous Q2H PRN Dasie Leonor CROME, MD   1 mg at 09/02/24 9147   LORazepam  (ATIVAN ) injection 0.5 mg  0.5 mg Intravenous Q6H PRN Raenelle Donalda HERO, MD   0.5 mg at 09/02/24 0236   micafungin  (MYCAMINE ) 100 mg in sodium chloride  0.9 % 100 mL IVPB  100 mg Intravenous Q24H Patsy Lenis, MD   Stopped at 09/01/24 0957   ondansetron  (ZOFRAN ) injection 4 mg  4 mg Intravenous Once Sundil, Subrina, MD       pantoprazole  (PROTONIX ) injection 40 mg  40 mg Intravenous Q12H Sundil, Subrina, MD   40 mg at 09/02/24 9147   sodium chloride  flush (NS) 0.9 % injection 3 mL  3 mL Intravenous Q12H Sundil, Subrina, MD   3 mL at 09/02/24 0901   sodium chloride  flush (NS) 0.9 % injection 3 mL  3 mL Intravenous Q12H Sundil, Subrina, MD   3 mL at 09/02/24  0901   sodium chloride  flush (NS) 0.9 % injection 3 mL  3 mL Intravenous PRN Sundil, Subrina, MD   3 mL at 08/31/24 9062   sodium chloride  flush (NS) 0.9 % injection 5 mL  5 mL Intracatheter Q8H Suttle, Ester PARAS, MD   5 mL at 09/02/24 0556   Allergies as of 08/27/2024   (No Known Allergies)   Review of Systems:  Review of Systems  Respiratory:  Negative for shortness of breath.   Cardiovascular:  Negative for chest pain.  Gastrointestinal:  Positive for abdominal pain. Negative for blood in stool, constipation, diarrhea, nausea and vomiting.    OBJECTIVE:   Temp:  [98.5 F (36.9 C)-98.9 F (37.2 C)] 98.7 F (37.1 C) (01/23 0740) Pulse Rate:  [74-84] 74 (01/23 0740) Resp:  [17-20] 20 (01/23  0740) BP: (135-145)/(83-91) 145/87 (01/23 0740) Last BM Date : 09/02/24 Physical Exam Constitutional:      General: She is not in acute distress.    Appearance: She is not ill-appearing, toxic-appearing or diaphoretic.  Cardiovascular:     Rate and Rhythm: Normal rate and regular rhythm.  Pulmonary:     Effort: No respiratory distress.     Breath sounds: Normal breath sounds.  Abdominal:     General: Bowel sounds are normal. There is no distension.     Palpations: Abdomen is soft.     Comments: JP drain with purulent fluid.  Neurological:     Mental Status: She is alert.     Labs: Recent Labs    08/31/24 0350 09/01/24 0238 09/02/24 0544  WBC 5.0 6.3 4.8  HGB 9.0* 8.7* 9.1*  HCT 28.1* 27.5* 28.3*  PLT 593* 614* 388   BMET Recent Labs    08/31/24 0350 09/01/24 0238 09/02/24 0544  NA 140 144 139  K 3.8 3.9 4.3  CL 107 108 108  CO2 26 26 21*  GLUCOSE 90 94 87  BUN 6* 6* 5*  CREATININE 0.65 0.67 0.62  CALCIUM  8.8* 9.0 8.9   LFT Recent Labs    09/01/24 0238  PROT 6.7  ALBUMIN 2.5*  AST 28  ALT 29  ALKPHOS 102  BILITOT 0.3   PT/INR No results for input(s): LABPROT, INR in the last 72 hours. Diagnostic imaging: DG UGI W SINGLE CM (SOL OR THIN BA) Result Date: 09/01/2024 CLINICAL DATA:  64 year old female with history of perforated gastric antrum ulcer with fistulous connection to hepatic abscess. Abscess drain placed on 08/28/2024 by IR. EXAM: DG UGI W SINGLE CM TECHNIQUE: Scout radiograph was obtained. Single contrast examination was performed using Omnipaque  300. This exam was performed by Rayfield Buff, NP, and was supervised and interpreted by Dr. Rockey Kilts. FLUOROSCOPY: Radiation Exposure Index (as provided by the fluoroscopic device): 52.6 mGy Kerma COMPARISON:  CT abdomen pelvis with contrast 08/27/2024 FINDINGS: Limited exam secondary to patient immobility. Scout Radiograph: Pre-procedure scout film demonstrates a drain in the right upper quadrant.  Esophagus: Markedly dilated esophagus. Filling defects within may represent debris or residual food. Esophageal motility: Esophageal stasis with markedly delayed passage of contrast from the esophagus into the stomach. Gastric emptying: Delayed. Duodenum: Slow transit into the duodenum. Secondary to delayed contrast transit from the esophagus, the stomach is suboptimally distended. The gastric antrum is poorly opacified and the proximal duodenum is never well opacified. IMPRESSION: 1. Limited exam secondary to patient immobility. 2. Marked esophageal distension with severe esophageal dysmotility. Findings suggest achalasia. 3. Esophageal dilatation and poor esophageal emptying make opacification of  the distal stomach poor. Therefore, evaluation for residual or recurrent leak is suboptimal. The patient may benefit from nasogastric tube placement with repeat upper GI via the nasogastric tube. Electronically Signed   By: Rockey Kilts M.D.   On: 09/01/2024 12:51   IMPRESSION: Perforated gastric antrum ulcer with fistulous connection to hepatic abscess              -Status post CT guided drainage 08/28/24  Abdominal pain secondary to above, improved Right sided pleural effusion status post thoracentesis  Melena, resolved Acute blood loss anemia, stable  NSAID use History polysubstance use  PLAN: - Discussed with General Surgery team, advance to full liquid diet today - Monitor bowel movements, continue PPI therapy  - Will need repeat EGD in 2 months to evaluate healing of gastric ulcer, no inpatient endoscopic intervention planned - Dr. Dianna to see tomorrow   LOS: 6 days   [name], DO [facility]

## 2024-09-02 NOTE — Progress Notes (Signed)
 Psychiatric Nurse Liaison Bedside Consultation Note  Current SI/HI/AVH: Denies SI/HI/AVH  Patient Mood/Affect: Patient depressed and anxious. Endorses sadness.  Noted Patient Behaviors: Patient tearful and depressed. Speech is tangential. Thought content includes religiosity and blaming others.  Interventions Initiated by Psychiatric Nurse Liaison: Therapeutic communication and emotional support.   Recommendations for Patient Care: Patient would benefit from a visit with the spiritual care team. Follow up from Psychiatric Nurse Liaison shift as needed.  Patients Response to Treatment: Patient thanked this RN for speaking with her. Patient endorsed improved mood.  Time Spent with Patient:   30 mins

## 2024-09-03 DIAGNOSIS — K922 Gastrointestinal hemorrhage, unspecified: Secondary | ICD-10-CM | POA: Diagnosis not present

## 2024-09-03 LAB — CBC
HCT: 29.4 % — ABNORMAL LOW (ref 36.0–46.0)
Hemoglobin: 9.4 g/dL — ABNORMAL LOW (ref 12.0–15.0)
MCH: 28.7 pg (ref 26.0–34.0)
MCHC: 32 g/dL (ref 30.0–36.0)
MCV: 89.6 fL (ref 80.0–100.0)
Platelets: 671 10*3/uL — ABNORMAL HIGH (ref 150–400)
RBC: 3.28 MIL/uL — ABNORMAL LOW (ref 3.87–5.11)
RDW: 14.8 % (ref 11.5–15.5)
WBC: 6.1 10*3/uL (ref 4.0–10.5)
nRBC: 0 % (ref 0.0–0.2)

## 2024-09-03 LAB — BASIC METABOLIC PANEL WITH GFR
Anion gap: 10 (ref 5–15)
BUN: <5 mg/dL — ABNORMAL LOW (ref 8–23)
CO2: 23 mmol/L (ref 22–32)
Calcium: 9.2 mg/dL (ref 8.9–10.3)
Chloride: 106 mmol/L (ref 98–111)
Creatinine, Ser: 0.68 mg/dL (ref 0.44–1.00)
GFR, Estimated: >60 mL/min
Glucose, Bld: 83 mg/dL (ref 70–99)
Potassium: 4.3 mmol/L (ref 3.5–5.1)
Sodium: 139 mmol/L (ref 135–145)

## 2024-09-03 LAB — MAGNESIUM: Magnesium: 1.9 mg/dL (ref 1.7–2.4)

## 2024-09-03 LAB — PHOSPHORUS: Phosphorus: 2.8 mg/dL (ref 2.5–4.6)

## 2024-09-03 NOTE — Plan of Care (Signed)

## 2024-09-03 NOTE — Progress Notes (Signed)
 [facility] Progress Note Patient: Jennifer [name] FMW:[id number] DOB: [birth year]  DOA: 08/27/2024 DOS: the patient was seen and examined on 09/03/2024  Brief Summary: Patient is a 64 y.o.  female with history of HTN, EtOH use, substance abuse (prior UDS positive for opiates/cocaine/benzos)-who recently had a admission for melena-EGD showed gastric ulcer-patient subsequently left AMA-presented with generalized weakness/melanotic stools/upper abdominal pain-she was found to have right hepatic abscess with fistulous communication to a contained gastric perforation.   Significant events: 1/17>> admit to [facility] 1/22>> underwent upper GI series.  Diet advanced.   Significant studies: 1/17>> CT abdomen/pelvis: Large right subhepatic subcapsular abscess with fistulous communication to the gastric antrum.  New moderate-sized right pleural effusion.  1/22>> upper GI series esophageal distention with severe esophageal dysmotility   Significant microbiology data: 1/18>> blood culture: No growth 1/18>> pleural fluid culture: No growth 1/18>> right hepatic abscess culture: Lactobacillus/rare yeast   Procedures: 1/18>> CT-guided right perihepatic abscess drain placement 1/18>> thoracocentesis by IR   Consults: [facility] IR General surgery ID   Assessment and Plan: Right hepatic abscess secondary to contained gastric perforation with fistulous communication Contained gastric perforation with fistulous communication with right hepatic abscess S/p CT-guided drain placement by IR on 1/18 On Zosyn /micafungin  Appreciate ID managing antibiotics. Appreciate General surgery and Gastroenterology consult. Upper GI series shows esophageal distention concerning for achalasia and no evidence of leakage of contrast. General surgery advancing to soft diet now. Currently on IV Unasyn .  And micafungin . Not recommended for home IV antibiotics. Will need to confirm duration with ID prior to  discharge.  Recent upper GI bleed (EGD on 1/3 showed candidiasis and gastric ulcer) Completed a course of fluconazole  On PPI twice daily-6-8 weeks   Right-sided pleural effusion S/p thoracocentesis on 1/18 Exudative by lights criteria-however cultures negative This is felt to be sympathetic effusion to hepatic abscess On empiric antibiotics as above   HTN BP stable-not on any antihypertensives   EtOH use Apparently has quit drinking-no withdrawal symptoms   History of substance abuse (UDS positive for benzo/opiates/cocaine on 1/4) Will counsel the patient prior to discharge   Noncompliance Patient threatening to leave AMA if not allowed to eat. Multiple providers have counseled her again and again regarding the importance of current management with being n.p.o. status. Multiple providers including myself have advised her of the life-threatening and life disabling risk of leaving AGAINST MEDICAL ADVICE.   Class 1 Obesity: Body mass index is 30.9 kg/m.  Placing the patient at high risk for him.   Normocytic acute on chronic blood loss anemia. Hemoglobin appears to be stable around 9.  Acute pain control. Patient reports severe pain in her abdomen. Utilizing multiple rounds of IV Dilaudid  overnight. At present given that the patient will be able to tolerate oral diet, suspect patient will be also able to tolerate oral medication.  Continue her on oral Oxy IR as well as IV Dilaudid .   DVT Prophylaxis: SCDs Start: 08/27/24 2352 Place TED hose Start: 08/27/24 2352  Data review I have Reviewed nursing notes, Vitals, and Lab results. Since last encounter, pertinent lab results CBC and BMP   . I have ordered test including CBC and BMP  .   Physical exam. Vitals:   09/03/24 0052 09/03/24 0456 09/03/24 0817 09/03/24 1524  BP: 117/74 (!) 151/86 135/81 (!) 139/90  Pulse: 66 70 77 81  Resp:   20 20  Temp: (!) 97 F (36.1 C) 98.4 F (36.9 C) 98.8 F (37.1 C) 99.5 F (37.5  C)  TempSrc: Axillary Oral Oral Oral  SpO2:   95% 95%  Weight:      Height:        General: in Mild distress, No Rash Cardiovascular: S1 and S2 Present, No Murmur Respiratory: Good respiratory effort, Bilateral Air entry present. No Crackles, No wheezes Abdomen: Bowel Sound present, No tenderness Extremities: No edema Neuro: Alert and oriented x3, no new focal deficit   Subjective: No acute complaint.  No nausea or vomiting seen after receiving pain medication.  Family Communication: Clear to auscultation. S1-S2 present Bowel sound present Mildly tender diffusely abdomen. No edema.  Disposition Plan: Status is: Inpatient Remains inpatient appropriate because: Monitor for improvement in pain control and diet tolerance.  Monitor for clarity from ID as well.   Planned Discharge Destination:Home  Diet: Diet Order             DIET SOFT Room service appropriate? Yes; Fluid consistency: Thin  Diet effective now                   MEDICATIONS: Scheduled Meds:  feeding supplement  1 Container Oral TID BM   pantoprazole  (PROTONIX ) IV  40 mg Intravenous Q12H   traZODone   50 mg Oral QHS   Continuous Infusions:  ampicillin -sulbactam (UNASYN ) IV 3 g (09/03/24 1303)   micafungin  (MYCAMINE ) 100 mg in sodium chloride  0.9 % 100 mL IVPB 100 mg (09/03/24 1105)   PRN Meds:.acetaminophen  **OR** acetaminophen , HYDROmorphone  (DILAUDID ) injection, LORazepam , oxyCODONE   Author: Yetta Blanch, MD  [facility] 09/03/2024  6:18 PM Between 7PM-7AM, please contact night-coverage, check [email or web address] for on call.

## 2024-09-03 NOTE — Progress Notes (Signed)
"   ° °  Subjective/Chief Complaint: Patient reports no abdominal pain, nausea, or vomiting Hungry - request solid food + BM Drain - 115 cc purulent output Normal WBC   Objective: Vital signs in last 24 hours: Temp:  [97 F (36.1 C)-98.8 F (37.1 C)] 98.8 F (37.1 C) (01/24 0817) Pulse Rate:  [66-77] 77 (01/24 0817) Resp:  [20] 20 (01/24 0817) BP: (117-151)/(74-89) 135/81 (01/24 0817) SpO2:  [92 %-95 %] 95 % (01/24 0817) Last BM Date : 09/02/24  Intake/Output from previous day: 01/23 0701 - 01/24 0700 In: 1855 [P.O.:240; IV Piggyback:1605] Out: 115 [Drains:115] Intake/Output this shift: No intake/output data recorded.  WDWN in NAD Abd - soft, non-tender Drain - purulent fluid; no sign of bilious drainage  Lab Results:  Recent Labs    09/02/24 0544 09/03/24 0542  WBC 4.8 6.1  HGB 9.1* 9.4*  HCT 28.3* 29.4*  PLT 388 671*   BMET Recent Labs    09/02/24 0544 09/03/24 0542  NA 139 139  K 4.3 4.3  CL 108 106  CO2 21* 23  GLUCOSE 87 83  BUN 5* <5*  CREATININE 0.62 0.68  CALCIUM  8.9 9.2    Studies/Results: No results found.  Anti-infectives: Anti-infectives (From admission, onward)    Start     Dose/Rate Route Frequency Ordered Stop   08/30/24 1245  Ampicillin -Sulbactam (UNASYN ) 3 g in sodium chloride  0.9 % 100 mL IVPB        3 g 200 mL/hr over 30 Minutes Intravenous Every 6 hours 08/30/24 1151     08/29/24 0815  micafungin  (MYCAMINE ) 100 mg in sodium chloride  0.9 % 100 mL IVPB        100 mg 105 mL/hr over 1 Hours Intravenous Every 24 hours 08/29/24 0807     08/28/24 1200  vancomycin  (VANCOREADY) IVPB 750 mg/150 mL  Status:  Discontinued        750 mg 150 mL/hr over 60 Minutes Intravenous Every 12 hours 08/27/24 2352 08/29/24 0926   08/28/24 1000  fluconazole  (DIFLUCAN ) tablet 400 mg        400 mg Oral Daily 08/27/24 2351 08/28/24 0928   08/28/24 0400  piperacillin -tazobactam (ZOSYN ) IVPB 3.375 g  Status:  Discontinued        3.375 g 12.5 mL/hr over  240 Minutes Intravenous Every 8 hours 08/27/24 2352 08/30/24 1150   08/27/24 2345  vancomycin  (VANCOREADY) IVPB 1750 mg/350 mL        1,750 mg 175 mL/hr over 120 Minutes Intravenous  Once 08/27/24 2343 08/28/24 0706   08/27/24 2245  piperacillin -tazobactam (ZOSYN ) IVPB 3.375 g        3.375 g 100 mL/hr over 30 Minutes Intravenous  Once 08/27/24 2239 08/27/24 2325       Assessment/Plan: 64 yo female with a known gastric antral ulcer, presenting with acute abdominal pain and a large upper abdominal abscess, consistent with a contained perforation. S/p percutaneous drain placement 1/18. - no fever, clinically stable with a benign abdominal exam - UGI study 1/22 limited, suggests possible esophageal dilation but no apparent leak - tolerating FLD with stable drain output, allow soft diet. - Abx per ID - Continue IV PPI and antibiotics - H pylori stool antigen  LOS: 7 days    Jennifer [name] 09/03/2024  "

## 2024-09-03 NOTE — Progress Notes (Signed)
 [facility] Progress Note  Jennifer [name] 64 y.o. 1960-10-03   Subjective: Sitting up in bed eating solid food. Denies abdominal pain.  Objective: Vital signs: Vitals:   09/03/24 0456 09/03/24 0817  BP: (!) 151/86 135/81  Pulse: 70 77  Resp:  20  Temp: 98.4 F (36.9 C) 98.8 F (37.1 C)  SpO2:  95%    Physical Exam: Gen: lethargic, well-nourished, no acute distress  HEENT: anicteric sclera CV: RRR Chest: CTA B Abd: epigastric tenderness with guarding, soft, nondistended, +BS Ext: no edema  Lab Results: Recent Labs    09/01/24 0238 09/02/24 0544 09/03/24 0542  NA 144 139 139  K 3.9 4.3 4.3  CL 108 108 106  CO2 26 21* 23  GLUCOSE 94 87 83  BUN 6* 5* <5*  CREATININE 0.67 0.62 0.68  CALCIUM  9.0 8.9 9.2  MG 1.9 1.8 1.9  PHOS 2.6  --  2.8   Recent Labs    09/01/24 0238  AST 28  ALT 29  ALKPHOS 102  BILITOT 0.3  PROT 6.7  ALBUMIN 2.5*   Recent Labs    09/02/24 0544 09/03/24 0542  WBC 4.8 6.1  HGB 9.1* 9.4*  HCT 28.3* 29.4*  MCV 90.1 89.6  PLT 388 671*      Assessment/Plan: Perforated gastric antral ulcer with subsequent hepatic abscess - s/p CT guided drainage. No signs of ongoing bleeding. Hgb 9.4. Advised to avoid NSAIDs. PPI BID for 3 months and then every day. Repeat EGD in 3 months to assess healing of ulcer. [facility] will sign off. Call if questions.   Jennifer [name] 09/03/2024, 1:05 PM  Questions please call [name], female   DOB: 09/03/1960, 64 y.o.   MRN: [id number]

## 2024-09-03 NOTE — Progress Notes (Signed)
 Psychiatric Nurse Liaison (PNL) Rounding Note  Current SI/HI/AVH: n/a  Patient Mood/Affect: flat  Noted Patient Behaviors: sitting in bed staring at the wall; disorganized speech  Interventions Initiated by Psychiatric Nurse Liaison: therapeutic communication  Recommendations for Patient Care: no new recommendations; continue plan of care  Patients Response to Treatment: no changes  Time Spent with Patient:   5 minutes   Loetta Pinal RN, BSN, RN-BC

## 2024-09-03 NOTE — Progress Notes (Signed)
 Psychiatric Nurse Liaison (PNL) Rounding Note  Current SI/HI/AVH: Denies SI/HI/AVH  Patient Mood/Affect: Depressed  Noted Patient Behaviors: Patient in bed with no clothes on. Tangential speech and hyper-religiosity.   Interventions Initiated by Psychiatric Nurse Liaison: Clean gown provided at patient's request. Emotional support and therapeutic communication.  Recommendations for Patient Care: Consult to spiritual care for prayer and Bible. Primary RN alerted and acknowledged.  Patients Response to Treatment: Effective  Time Spent with Patient:   10 mins

## 2024-09-04 ENCOUNTER — Inpatient Hospital Stay (HOSPITAL_COMMUNITY)

## 2024-09-04 DIAGNOSIS — K922 Gastrointestinal hemorrhage, unspecified: Secondary | ICD-10-CM | POA: Diagnosis not present

## 2024-09-04 LAB — BASIC METABOLIC PANEL WITH GFR
Anion gap: 9 (ref 5–15)
BUN: <5 mg/dL — ABNORMAL LOW (ref 8–23)
CO2: 26 mmol/L (ref 22–32)
Calcium: 9.2 mg/dL (ref 8.9–10.3)
Chloride: 106 mmol/L (ref 98–111)
Creatinine, Ser: 0.73 mg/dL (ref 0.44–1.00)
GFR, Estimated: >60 mL/min
Glucose, Bld: 76 mg/dL (ref 70–99)
Potassium: 4.1 mmol/L (ref 3.5–5.1)
Sodium: 141 mmol/L (ref 135–145)

## 2024-09-04 LAB — CBC
HCT: 30.4 % — ABNORMAL LOW (ref 36.0–46.0)
Hemoglobin: 9.6 g/dL — ABNORMAL LOW (ref 12.0–15.0)
MCH: 28.3 pg (ref 26.0–34.0)
MCHC: 31.6 g/dL (ref 30.0–36.0)
MCV: 89.7 fL (ref 80.0–100.0)
Platelets: 667 10*3/uL — ABNORMAL HIGH (ref 150–400)
RBC: 3.39 MIL/uL — ABNORMAL LOW (ref 3.87–5.11)
RDW: 14.7 % (ref 11.5–15.5)
WBC: 5.8 10*3/uL (ref 4.0–10.5)
nRBC: 0 % (ref 0.0–0.2)

## 2024-09-04 LAB — MAGNESIUM: Magnesium: 1.7 mg/dL (ref 1.7–2.4)

## 2024-09-04 LAB — PHOSPHORUS: Phosphorus: 3 mg/dL (ref 2.5–4.6)

## 2024-09-04 MED ORDER — OXYCODONE HCL 5 MG PO TABS
5.0000 mg | ORAL_TABLET | ORAL | Status: DC | PRN
  Filled 2024-09-04 (×2): qty 1

## 2024-09-04 MED ORDER — OXYCODONE HCL 5 MG PO TABS
5.0000 mg | ORAL_TABLET | Freq: Once | ORAL | Status: AC
  Administered 2024-09-04: 5 mg via ORAL
  Filled 2024-09-04: qty 1

## 2024-09-04 MED ORDER — OXYCODONE HCL 5 MG PO TABS
10.0000 mg | ORAL_TABLET | ORAL | Status: DC | PRN
  Administered 2024-09-04 – 2024-09-07 (×11): 10 mg via ORAL
  Filled 2024-09-04 (×10): qty 2

## 2024-09-04 MED ORDER — LORAZEPAM 2 MG/ML IJ SOLN
0.5000 mg | Freq: Four times a day (QID) | INTRAMUSCULAR | Status: AC | PRN
  Administered 2024-09-07 – 2024-09-16 (×8): 0.5 mg via INTRAVENOUS
  Filled 2024-09-04 (×8): qty 1

## 2024-09-04 MED ORDER — IOHEXOL 9 MG/ML PO SOLN
500.0000 mL | ORAL | Status: AC
  Administered 2024-09-04 (×2): 500 mL via ORAL

## 2024-09-04 MED ORDER — IOHEXOL 350 MG/ML SOLN
75.0000 mL | Freq: Once | INTRAVENOUS | Status: AC | PRN
  Administered 2024-09-04: 75 mL via INTRAVENOUS

## 2024-09-04 MED ORDER — LORAZEPAM 0.5 MG PO TABS
0.5000 mg | ORAL_TABLET | Freq: Four times a day (QID) | ORAL | Status: AC | PRN
  Administered 2024-09-05 – 2024-09-13 (×14): 0.5 mg via ORAL
  Filled 2024-09-04 (×15): qty 1

## 2024-09-04 MED ORDER — HYDROMORPHONE HCL 1 MG/ML IJ SOLN
0.5000 mg | INTRAMUSCULAR | Status: DC | PRN
  Administered 2024-09-04 – 2024-09-07 (×16): 0.5 mg via INTRAVENOUS
  Filled 2024-09-04 (×16): qty 0.5

## 2024-09-04 MED ORDER — CYCLOBENZAPRINE HCL 5 MG PO TABS
5.0000 mg | ORAL_TABLET | Freq: Three times a day (TID) | ORAL | Status: DC
  Administered 2024-09-04 – 2024-09-07 (×10): 5 mg via ORAL
  Filled 2024-09-04 (×11): qty 1

## 2024-09-04 NOTE — Progress Notes (Signed)
 [facility] Progress Note Patient: Jennifer [name] FMW:[id number] DOB: 02-04-1961  DOA: 08/27/2024 DOS: the patient was seen and examined on 09/04/2024  Brief Summary: Patient is a 64 y.o.  female with history of HTN, EtOH use, substance abuse (prior UDS positive for opiates/cocaine/benzos)-who recently had a admission for melena-EGD showed gastric ulcer-patient subsequently left AMA-presented with generalized weakness/melanotic stools/upper abdominal pain-she was found to have right hepatic abscess with fistulous communication to a contained gastric perforation.   Significant events: 1/17>> admit to [facility] 1/22>> underwent upper GI series.  Diet advanced. 1/25>> repeat CT abdomen pelvis with contrast ordered.   Significant studies: 1/17>> CT abdomen/pelvis: Large right subhepatic subcapsular abscess with fistulous communication to the gastric antrum.  New moderate-sized right pleural effusion.  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 Currently on IV Unasyn .  And micafungin . Appreciate ID managing antibiotics. Appreciate General surgery and Gastroenterology consult. Upper GI series shows esophageal distention concerning for achalasia and no evidence of leakage of contrast. Currently on soft diet now.  Continues to report severe pain requiring pain medication. Not  recommended for home IV antibiotics.  Not a good candidate for long-term drain management at home as well. Discussed with ID as well as general surgery.  CT abdomen pelvis with contrast ordered to evaluate abscess resolution.  Recent upper GI bleed (EGD on 1/3 showed candidiasis and gastric ulcer) Completed a course of fluconazole  On PPI twice daily-6-8 weeks   Right-sided pleural effusion S/p thoracocentesis on 1/18 Exudative by lights criteria-however cultures negative This is felt to be sympathetic effusion to hepatic abscess On empiric antibiotics as above   HTN BP stable-not on any antihypertensives   EtOH use Apparently has quit drinking-no withdrawal symptoms   History of substance abuse (UDS positive for benzo/opiates/cocaine on 1/4) Will counsel the patient prior to discharge   Noncompliance Continues to have poor insight in her medical condition. She has started to leave AMA multiple times. Multiple providers have counseled her again and again regarding the importance of current management. Multiple providers including myself have advised her of the life-threatening and life disabling risk of leaving AGAINST MEDICAL ADVICE.   Class 1 Obesity: Body mass index is 30.9 kg/m.  Placing the patient at high risk for him.   Normocytic acute on chronic blood loss anemia. Hemoglobin appears to be stable around 9.  Acute pain control. Patient reports severe pain in her abdomen. Currently able to tolerate oral soft diet but at the same time reports severe pain in her drain area. Not hypotensive not tachycardic and somewhat encephalopathic with ongoing poor insight. Currently do not think that the patient is requiring significantly high dose of IV narcotics and actually appears getting close to being discharged home. I will switch her IV Dilaudid  to oral oxycodone  10 mg as needed. I will continue oral oxycodone  5 mg as needed for moderate pain. I will only use Dilaudid  going  forward for refractory pain to oral medication. As the patient reports muscle pain around the drain  site I will add low-dose muscle relaxer on a scheduled basis.    Code Status: Full Code   DVT Prophylaxis: SCDs Start: 08/27/24 2352 Place TED hose Start: 08/27/24 2352  Data review I have Reviewed nursing notes, Vitals, and Lab results. Since last encounter, pertinent lab results CBC and BMP   . I have ordered test including CBC and BMP  . I have discussed pt's care plan and test results with ID and general surgery  . I have ordered imaging CT abdomen pelvis with contrast  .   Physical exam. Vitals:   09/03/24 1524 09/03/24 1953 09/04/24 0356 09/04/24 0807  BP: (!) 139/90 (!) 143/98 139/85 133/84  Pulse: 81 78 73 90  Resp: 20 19 19    Temp: 99.5 F (37.5 C) 99.6 F (37.6 C) 99.1 F (37.3 C) 98.7 F (37.1 C)  TempSrc: Oral Oral Oral Oral  SpO2: 95% 98% 92% 93%  Weight:      Height:        General: in Mild distress, No Rash Cardiovascular: S1 and S2 Present, No Murmur Respiratory: Good respiratory effort, Bilateral Air entry present. No Crackles, No wheezes Abdomen: Bowel Sound present, diffuse abdominal pain more on the right side tenderness Extremities: No edema Neuro: Alert and oriented x3, no new focal deficit   Subjective: Ongoing complaint of abdominal pain.  No nausea no vomiting.  Had a BM.  Family Communication: No one at bedside  Disposition Plan: Status is: Inpatient Remains inpatient appropriate because: Currently on IV antibiotics.  Still has a drain.  Not a candidate for outpatient management for both.   Planned Discharge Destination:Home Diet: Diet Order             DIET SOFT Room service appropriate? Yes; Fluid consistency: Thin  Diet effective now                   MEDICATIONS: Scheduled Meds:  cyclobenzaprine   5 mg Oral TID   feeding supplement  1 Container Oral TID BM   pantoprazole  (PROTONIX ) IV  40 mg Intravenous Q12H   traZODone   50 mg  Oral QHS   Continuous Infusions:  ampicillin -sulbactam (UNASYN ) IV 3 g (09/04/24 1225)   micafungin  (MYCAMINE ) 100 mg in sodium chloride  0.9 % 100 mL IVPB 100 mg (09/04/24 1050)   PRN Meds:.acetaminophen  **OR** acetaminophen , HYDROmorphone  (DILAUDID ) injection, LORazepam  **OR** LORazepam , oxyCODONE  **OR** oxyCODONE   Author: Yetta Blanch, MD  [facility] 09/04/2024  6:12 PM Between 7PM-7AM, please contact night-coverage, check [email or web address] for on call.

## 2024-09-04 NOTE — Progress Notes (Signed)
"   ° °  Subjective/Chief Complaint: Tolerating regular diet - had minimal abdominal pain after finishing meal No nausea or vomiting WBC normal Drain 40 cc output  Objective: Vital signs in last 24 hours: Temp:  [98.7 F (37.1 C)-99.6 F (37.6 C)] 98.7 F (37.1 C) (01/25 0807) Pulse Rate:  [73-90] 90 (01/25 0807) Resp:  [19-20] 19 (01/25 0356) BP: (133-143)/(84-98) 133/84 (01/25 0807) SpO2:  [92 %-98 %] 93 % (01/25 0807) Last BM Date : 09/02/24  Intake/Output from previous day: 01/24 0701 - 01/25 0700 In: 405 [IV Piggyback:400] Out: 40 [Drains:40] Intake/Output this shift: No intake/output data recorded.  WDWN in NAD Abd - soft, non-tender Drain - purulent fluid; no sign of bilious drainage  Lab Results:  Recent Labs    09/03/24 0542 09/04/24 0543  WBC 6.1 5.8  HGB 9.4* 9.6*  HCT 29.4* 30.4*  PLT 671* 667*   BMET Recent Labs    09/03/24 0542 09/04/24 0543  NA 139 141  K 4.3 4.1  CL 106 106  CO2 23 26  GLUCOSE 83 76  BUN <5* <5*  CREATININE 0.68 0.73  CALCIUM  9.2 9.2    Anti-infectives: Anti-infectives (From admission, onward)    Start     Dose/Rate Route Frequency Ordered Stop   08/30/24 1245  Ampicillin -Sulbactam (UNASYN ) 3 g in sodium chloride  0.9 % 100 mL IVPB        3 g 200 mL/hr over 30 Minutes Intravenous Every 6 hours 08/30/24 1151     08/29/24 0815  micafungin  (MYCAMINE ) 100 mg in sodium chloride  0.9 % 100 mL IVPB        100 mg 105 mL/hr over 1 Hours Intravenous Every 24 hours 08/29/24 0807     08/28/24 1200  vancomycin  (VANCOREADY) IVPB 750 mg/150 mL  Status:  Discontinued        750 mg 150 mL/hr over 60 Minutes Intravenous Every 12 hours 08/27/24 2352 08/29/24 0926   08/28/24 1000  fluconazole  (DIFLUCAN ) tablet 400 mg        400 mg Oral Daily 08/27/24 2351 08/28/24 0928   08/28/24 0400  piperacillin -tazobactam (ZOSYN ) IVPB 3.375 g  Status:  Discontinued        3.375 g 12.5 mL/hr over 240 Minutes Intravenous Every 8 hours 08/27/24 2352  08/30/24 1150   08/27/24 2345  vancomycin  (VANCOREADY) IVPB 1750 mg/350 mL        1,750 mg 175 mL/hr over 120 Minutes Intravenous  Once 08/27/24 2343 08/28/24 0706   08/27/24 2245  piperacillin -tazobactam (ZOSYN ) IVPB 3.375 g        3.375 g 100 mL/hr over 30 Minutes Intravenous  Once 08/27/24 2239 08/27/24 2325       Assessment/Plan: 64 yo female with a known gastric antral ulcer, presenting with acute abdominal pain and a large upper abdominal abscess, consistent with a contained perforation. S/p percutaneous drain placement 1/18. - no fever, clinically stable with a benign abdominal exam - UGI study 1/22 limited, suggests possible esophageal dilation but no apparent leak - soft diet - Abx per ID.   - Repeat CT scan with PO and IV contrast to evaluate abscess and rule out ongoing leak - Continue PPI and antibiotics - H pylori stool antigen  LOS: 8 days    Jennifer [name] 09/04/2024  "

## 2024-09-04 NOTE — Plan of Care (Signed)
   Problem: Education: Goal: Knowledge of General Education information will improve Description: Including pain rating scale, medication(s)/side effects and non-pharmacologic comfort measures Outcome: Not Progressing   Problem: Health Behavior/Discharge Planning: Goal: Ability to manage health-related needs will improve Outcome: Not Progressing   Problem: Clinical Measurements: Goal: Ability to maintain clinical measurements within normal limits will improve Outcome: Not Progressing Goal: Will remain free from infection Outcome: Not Progressing Goal: Diagnostic test results will improve Outcome: Not Progressing Goal: Respiratory complications will improve Outcome: Not Progressing Goal: Cardiovascular complication will be avoided Outcome: Not Progressing   Problem: Activity: Goal: Risk for activity intolerance will decrease Outcome: Not Progressing   Problem: Nutrition: Goal: Adequate nutrition will be maintained Outcome: Not Progressing   Problem: Coping: Goal: Level of anxiety will decrease Outcome: Not Progressing   Problem: Elimination: Goal: Will not experience complications related to bowel motility Outcome: Not Progressing Goal: Will not experience complications related to urinary retention Outcome: Not Progressing   Problem: Pain Managment: Goal: General experience of comfort will improve and/or be controlled Outcome: Not Progressing   Problem: Safety: Goal: Ability to remain free from injury will improve Outcome: Not Progressing   Problem: Skin Integrity: Goal: Risk for impaired skin integrity will decrease Outcome: Not Progressing   Problem: Activity: Goal: Ability to tolerate increased activity will improve Outcome: Not Progressing   Problem: Clinical Measurements: Goal: Ability to maintain a body temperature in the normal range will improve Outcome: Not Progressing   Problem: Respiratory: Goal: Ability to maintain adequate ventilation will  improve Outcome: Not Progressing Goal: Ability to maintain a clear airway will improve Outcome: Not Progressing

## 2024-09-04 NOTE — Plan of Care (Signed)

## 2024-09-05 ENCOUNTER — Inpatient Hospital Stay (HOSPITAL_COMMUNITY)

## 2024-09-05 DIAGNOSIS — K922 Gastrointestinal hemorrhage, unspecified: Secondary | ICD-10-CM | POA: Diagnosis not present

## 2024-09-05 LAB — CBC
HCT: 26.7 % — ABNORMAL LOW (ref 36.0–46.0)
Hemoglobin: 8.8 g/dL — ABNORMAL LOW (ref 12.0–15.0)
MCH: 28.4 pg (ref 26.0–34.0)
MCHC: 33 g/dL (ref 30.0–36.0)
MCV: 86.1 fL (ref 80.0–100.0)
Platelets: 634 10*3/uL — ABNORMAL HIGH (ref 150–400)
RBC: 3.1 MIL/uL — ABNORMAL LOW (ref 3.87–5.11)
RDW: 14.6 % (ref 11.5–15.5)
WBC: 5.4 10*3/uL (ref 4.0–10.5)
nRBC: 0 % (ref 0.0–0.2)

## 2024-09-05 LAB — BASIC METABOLIC PANEL WITH GFR
Anion gap: 9 (ref 5–15)
BUN: <5 mg/dL — ABNORMAL LOW (ref 8–23)
CO2: 27 mmol/L (ref 22–32)
Calcium: 9.1 mg/dL (ref 8.9–10.3)
Chloride: 105 mmol/L (ref 98–111)
Creatinine, Ser: 0.75 mg/dL (ref 0.44–1.00)
GFR, Estimated: >60 mL/min
Glucose, Bld: 88 mg/dL (ref 70–99)
Potassium: 3.6 mmol/L (ref 3.5–5.1)
Sodium: 141 mmol/L (ref 135–145)

## 2024-09-05 LAB — MAGNESIUM: Magnesium: 1.8 mg/dL (ref 1.7–2.4)

## 2024-09-05 LAB — PHOSPHORUS: Phosphorus: 3 mg/dL (ref 2.5–4.6)

## 2024-09-05 MED ORDER — LIDOCAINE HCL 1 % IJ SOLN
20.0000 mL | Freq: Once | INTRAMUSCULAR | Status: AC
  Administered 2024-09-05: 6 mL via INTRADERMAL

## 2024-09-05 MED ORDER — FENTANYL CITRATE (PF) 100 MCG/2ML IJ SOLN
INTRAMUSCULAR | Status: AC
  Filled 2024-09-05: qty 2

## 2024-09-05 MED ORDER — MIDAZOLAM HCL (PF) 2 MG/2ML IJ SOLN
INTRAMUSCULAR | Status: AC | PRN
  Administered 2024-09-05 (×2): 1 mg via INTRAVENOUS

## 2024-09-05 MED ORDER — IOHEXOL 300 MG/ML  SOLN
50.0000 mL | Freq: Once | INTRAMUSCULAR | Status: AC | PRN
  Administered 2024-09-05: 15 mL

## 2024-09-05 MED ORDER — LIDOCAINE HCL 1 % IJ SOLN
INTRAMUSCULAR | Status: AC
  Filled 2024-09-05: qty 20

## 2024-09-05 MED ORDER — MIDAZOLAM HCL 2 MG/2ML IJ SOLN
INTRAMUSCULAR | Status: AC
  Filled 2024-09-05: qty 2

## 2024-09-05 MED ORDER — FENTANYL CITRATE (PF) 100 MCG/2ML IJ SOLN
INTRAMUSCULAR | Status: AC | PRN
  Administered 2024-09-05 (×2): 50 ug via INTRAVENOUS
  Administered 2024-09-05: 75 ug via INTRAVENOUS

## 2024-09-05 NOTE — Consult Note (Signed)
 "   Chief Complaint: Large perihepatic abscess  Referring Provider(s): Dr. Leonor Dawn  Supervising Physician: Philip Cornet  Patient Status: [facility] - In-pt  History of Present Illness: Jennifer [name] is a 64 y.o. female with PMHs of HTN, bipolar 1 disorder, polysubstance abuse who was admitted due to perforated gastric ulcer and hepatic abscess. CT at that time showed large right hepatic subcapsular abscess with fistulous communication to the gastric antrum.  IR was consulted and 10Fr perihepatic abscess RUQ drain was placed 08/28/24 by Dr. Jennefer. Since then, the drain has put out significant volume of purulent material daily ranging from 30 ml to 280 mL. WBC remains WNL, and she has been afebrile.   CT repeated on 09/04/24 showed persistent abscess with drain in place with no evidence of fistula. IR consulted.   Confirms NPO since MN except sips with meds.   Does not wear CPAP or use supplemental home O2. She tolerated moderate sedation in IR for previous procedure.   Denies fever, SOB, CP, sore throat, blood in stool or urine, abnormal bruising. She does endorse chills, diffuse abd tenderness, nausea (well improved inpatient and no longer with emesis).   Allergies Reviewed:  Patient has no known allergies.   Patient is Full Code  Past Medical History:  Diagnosis Date   bipolar    Bipolar 1 disorder (HCC)    Depression    Fibroids    now s/p hysterectomy   History of alcohol use 08/27/2024   Hypertension    Left ventricular hypertrophy    Polysubstance abuse (HCC)    IV drug us , cocaine on occassion, smoking and alcoholism, has been going to AA, not completely sober    Past Surgical History:  Procedure Laterality Date   ABDOMINAL HYSTERECTOMY     2003   BILATERAL SALPINGOOPHORECTOMY     01/2010   BONE BIOPSY  08/13/2024   Procedure: BIOPSY, GI;  Surgeon: Dianna Specking, MD;  Location: [facility];  Service: Gastroenterology;;   DILATION AND CURETTAGE OF UTERUS      ESOPHAGOGASTRODUODENOSCOPY N/A 08/13/2024   Procedure: EGD (ESOPHAGOGASTRODUODENOSCOPY);  Surgeon: Dianna Specking, MD;  Location: [facility];  Service: Gastroenterology;  Laterality: N/A;   EXPLORATORY LAPAROTOMY     complex pelvic mass 2011   TONSILLECTOMY        Medications: Prior to Admission medications  Medication Sig Start Date End Date Taking? Authorizing Provider  [Paused] amLODipine  (NORVASC ) 10 MG tablet TAKE 1 TABLET (10 MG TOTAL) BY MOUTH DAILY (Office visit for future refills) Wait to take this until your doctor or other care provider tells you to start again. 07/14/24   Newlin, Enobong, MD  atorvastatin  (LIPITOR) 10 MG tablet Take 1 tablet (10 mg total) by mouth daily. Patient not taking: Reported on 08/28/2024 07/14/24   Newlin, Enobong, MD  Blood Pressure Monitoring (BLOOD PRESSURE CUFF) MISC 1 Units by Does not apply route daily. 05/24/24   Mayers, Cari S, PA-C  dicyclomine  (BENTYL ) 20 MG tablet Take 1 tablet (20 mg total) by mouth 3 (three) times daily as needed for spasms. Patient not taking: Reported on 08/28/2024 08/22/24 09/21/24  Christobal Guadalajara, MD  folic acid  (FOLVITE ) 1 MG tablet Take 1 tablet (1 mg total) by mouth daily. Patient not taking: Reported on 08/28/2024 08/22/24 09/21/24  Christobal Guadalajara, MD  [Paused] lisinopril -hydrochlorothiazide  (ZESTORETIC ) 20-12.5 MG tablet Take 2 tablets by mouth daily. Wait to take this until your doctor or other care provider tells you to start again. 07/14/24   Newlin,  Corrina, MD  metoprolol  succinate (TOPROL -XL) 50 MG 24 hr tablet Take 1 tablet (50 mg total) by mouth daily. Patient not taking: Reported on 08/28/2024 07/14/24   Newlin, Enobong, MD  oxyCODONE -acetaminophen  (PERCOCET/ROXICET) 5-325 MG tablet Take 1 tablet by mouth every 6 (six) hours as needed for moderate pain (pain score 4-6). Patient not taking: Reported on 08/28/2024 08/22/24   Christobal Guadalajara, MD  pantoprazole  (PROTONIX ) 40 MG tablet Take 1 tablet (40 mg total) by mouth 2 (two)  times daily. Patient not taking: Reported on 08/28/2024 08/22/24 09/21/24  Christobal Guadalajara, MD  thiamine  (VITAMIN B-1) 100 MG tablet Take 1 tablet (100 mg total) by mouth daily. Patient not taking: Reported on 08/28/2024 08/22/24 09/21/24  Christobal Guadalajara, MD     Family History  Problem Relation Age of Onset   Hypertension Mother    Hypertension Sister    Diabetes Father     Social History   Socioeconomic History   Marital status: Single    Spouse name: Not on file   Number of children: 0   Years of education: Not on file   Highest education level: Some college, no degree  Occupational History   Not on file  Tobacco Use   Smoking status: Every Day    Current packs/day: 1.00    Average packs/day: 1 pack/day for 20.0 years (20.0 ttl pk-yrs)    Types: Cigarettes   Smokeless tobacco: Never   Tobacco comments:    Wants to quit.   Vaping Use   Vaping status: Never Used  Substance and Sexual Activity   Alcohol use: Yes    Comment: occ.   Drug use: Not Currently    Types: Cocaine, Marijuana   Sexual activity: Not Currently    Birth control/protection: Surgical  Other Topics Concern   Not on file  Social History Narrative   Financial assistance approved for 100% discount at [facility] [address] and has [facility] card per Barnie Potters   07/19/2010         Social Drivers of Health   Tobacco Use: High Risk (08/28/2024)   Patient History    Smoking Tobacco Use: Every Day    Smokeless Tobacco Use: Never    Passive Exposure: Not on file  Financial Resource Strain: Not on file  Food Insecurity: No Food Insecurity (08/29/2024)   Epic    Worried About Programme Researcher, Broadcasting/film/video in the Last Year: Never true    Ran Out of Food in the Last Year: Never true  Transportation Needs: No Transportation Needs (08/29/2024)   Epic    Lack of Transportation (Medical): No    Lack of Transportation (Non-Medical): No  Recent Concern: Transportation Needs - Unmet Transportation Needs (08/14/2024)   Epic    Lack of Transportation  (Medical): Yes    Lack of Transportation (Non-Medical): Yes  Physical Activity: Not on file  Stress: Not on file  Social Connections: Not on file  Depression (PHQ2-9): Low Risk (05/24/2024)   Depression (PHQ2-9)    PHQ-2 Score: 1  Recent Concern: Depression (PHQ2-9) - Medium Risk (03/04/2024)   Depression (PHQ2-9)    PHQ-2 Score: 6  Alcohol Screen: Not on file  Housing: Low Risk (08/29/2024)   Epic    Unable to Pay for Housing in the Last Year: No    Number of Times Moved in the Last Year: 0    Homeless in the Last Year: No  Utilities: Not At Risk (08/29/2024)   Epic    Threatened with loss of utilities:  No  Health Literacy: Not on file     Review of Systems: A 12 point ROS discussed and pertinent positives are indicated in the HPI above.  All other systems are negative.   Vital Signs: BP (!) 166/99 (BP Location: Left Arm)   Pulse 77   Temp 98.7 F (37.1 C) (Oral)   Resp 19   Ht 5' 4 (1.626 m)   Wt 180 lb (81.6 kg)   LMP 12/04/2009   SpO2 96%   BMI 30.90 kg/m     Physical Exam HENT:     Mouth/Throat:     Mouth: Mucous membranes are moist.     Pharynx: Oropharynx is clear.  Cardiovascular:     Rate and Rhythm: Normal rate and regular rhythm.     Pulses: Normal pulses.     Heart sounds: Normal heart sounds.  Pulmonary:     Effort: Pulmonary effort is normal.     Breath sounds: Normal breath sounds.  Abdominal:     General: Bowel sounds are normal.     Palpations: Abdomen is soft.     Tenderness: There is abdominal tenderness.     Comments: RUQ drain present with bulb well charged. Suture does appear loose. Purulent output present in bulb. Abd tenderness is worst directly around insertion site. No erythema. Moderate crusting present at insertion.   Musculoskeletal:     Right lower leg: Edema present.     Left lower leg: Edema present.  Neurological:     Mental Status: She is alert and oriented to person, place, and time.  Psychiatric:        Mood and  Affect: Mood normal.        Behavior: Behavior normal.        Thought Content: Thought content normal.        Judgment: Judgment normal.     Imaging: CT ABDOMEN PELVIS W CONTRAST Result Date: 09/04/2024 EXAM: CT ABDOMEN AND PELVIS WITH CONTRAST 09/04/2024 08:13:26 PM TECHNIQUE: CT of the abdomen and pelvis was performed with the administration of 75 mL of iohexol  (OMNIPAQUE ) 350 MG/ML injection. Multiplanar reformatted images are provided for review. Automated exposure control, iterative reconstruction, and/or weight-based adjustment of the mA/kV was utilized to reduce the radiation dose to as low as reasonably achievable. COMPARISON: Comparison with 08/27/2024. CLINICAL HISTORY: Intra-abdominal abscess. Intra-abdominal abscess. FINDINGS: LOWER CHEST: Bilateral pleural effusions, greater on the right. Atelectasis or consolidation in the lung bases, also greater on the right. This could represent compressive atelectasis or pneumonia. The esophagus is diffusely dilated and filled with ingested material suggesting dysmotility or achalasia. No obstructing lesion is identified. LIVER: Subcapsular abscess in the anterior liver with air-fluid level measuring 5.6 x 9.5 cm diameter. The size is similar to the prior study. A pigtail drainage catheter is in place within the collection. Mild stranding in the soft tissues around the liver edge. GALLBLADDER AND BILE DUCTS: Gallbladder is unremarkable. No biliary ductal dilatation. SPLEEN: No acute abnormality. PANCREAS: No acute abnormality. ADRENAL GLANDS: No acute abnormality. KIDNEYS, URETERS AND BLADDER: 5 mm stone in the lower pole of the right kidney. No hydronephrosis or hydroureter. Nephrograms are symmetrical. No perinephric or periureteral stranding. Urinary bladder is unremarkable. GI AND BOWEL: The stomach, small bowel, and colon are not abnormally distended. Contrast material is present in the colon, suggesting no evidence of obstruction. Appendix is normal.  PERITONEUM AND RETROPERITONEUM: No ascites. No free air. VASCULATURE: Aorta is normal in caliber. Calcification of the aorta. No aneurysm. LYMPH NODES:  No lymphadenopathy. REPRODUCTIVE ORGANS: The uterus appears surgically absent. No abnormal adnexal masses. BONES AND SOFT TISSUES: Degenerative changes in the spine. Slight anterior subluxation of L4 on L5. No acute bony abnormalities. Motion artifact limits evaluation. No focal soft tissue abnormality. IMPRESSION: 1. Subcapsular abscess in the anterior liver with air-fluid level measuring 5.6 x 9.5 cm diameter, similar to the prior study, with a pigtail drainage catheter in place within the collection. 2. Bilateral pleural effusions, greater on the right, and atelectasis or consolidation in the lung bases, also greater on the right, which may represent compressive atelectasis or pneumonia. 3. Esophageal dysmotility or achalasia with no obstructing lesion identified. Electronically signed by: Elsie Gravely MD 09/04/2024 08:21 PM EST RP Workstation: [id number]   DG UGI W SINGLE CM (SOL OR THIN BA) Result Date: 09/01/2024 CLINICAL DATA:  64 year old female with history of perforated gastric antrum ulcer with fistulous connection to hepatic abscess. Abscess drain placed on 08/28/2024 by IR. EXAM: DG UGI W SINGLE CM TECHNIQUE: Scout radiograph was obtained. Single contrast examination was performed using Omnipaque  300. This exam was performed by Rayfield Buff, NP, and was supervised and interpreted by Dr. Rockey Kilts. FLUOROSCOPY: Radiation Exposure Index (as provided by the fluoroscopic device): 52.6 mGy Kerma COMPARISON:  CT abdomen pelvis with contrast 08/27/2024 FINDINGS: Limited exam secondary to patient immobility. Scout Radiograph: Pre-procedure scout film demonstrates a drain in the right upper quadrant. Esophagus: Markedly dilated esophagus. Filling defects within may represent debris or residual food. Esophageal motility: Esophageal stasis with markedly  delayed passage of contrast from the esophagus into the stomach. Gastric emptying: Delayed. Duodenum: Slow transit into the duodenum. Secondary to delayed contrast transit from the esophagus, the stomach is suboptimally distended. The gastric antrum is poorly opacified and the proximal duodenum is never well opacified. IMPRESSION: 1. Limited exam secondary to patient immobility. 2. Marked esophageal distension with severe esophageal dysmotility. Findings suggest achalasia. 3. Esophageal dilatation and poor esophageal emptying make opacification of the distal stomach poor. Therefore, evaluation for residual or recurrent leak is suboptimal. The patient may benefit from nasogastric tube placement with repeat upper GI via the nasogastric tube. Electronically Signed   By: Rockey Kilts M.D.   On: 09/01/2024 12:51   DG CHEST PORT 1 VIEW Result Date: 08/29/2024 EXAM: 1 VIEW(S) XRAY OF THE CHEST 08/29/2024 09:38:00 PM COMPARISON: 08/28/2024 CLINICAL HISTORY: SOB (shortness of breath) FINDINGS: LUNGS AND PLEURA: There is a small right pleural effusion. There are bibasilar opacities, right greater than left, which may represent atelectasis. No pneumothorax. HEART AND MEDIASTINUM: Cardiomegaly. The heart is enlarged. BONES AND SOFT TISSUES: No acute osseous abnormality. IMPRESSION: 1. Cardiomegaly. 2. Small right pleural effusion. 3. Bibasilar opacities, right greater than left, which may represent atelectasis. Electronically signed by: Greig Pique MD 08/29/2024 09:44 PM EST RP Workstation: [id number]   CT GUIDED VISCERAL FLUID DRAIN BY PERC CATH Result Date: 08/28/2024 INDICATION: 64 year old female with history of perforated gastric ulcer and perihepatic abscess formation. EXAM: CT IMAGE GUIDED DRAINAGE BY PERCUTANEOUS CATHETER COMPARISON:  08/27/2024 MEDICATIONS: The patient is currently admitted to the hospital and receiving intravenous antibiotics. The antibiotics were administered within an appropriate time frame  prior to the initiation of the procedure. ANESTHESIA/SEDATION: Moderate (conscious) sedation was employed during this procedure. A total of Versed  2 mg and Fentanyl  100 mcg was administered intravenously. Moderate Sedation Time: 15 minutes. The patient's level of consciousness and vital signs were monitored continuously by radiology nursing throughout the procedure under my direct supervision. CONTRAST:  None  COMPLICATIONS: None immediate. PROCEDURE: RADIATION DOSE REDUCTION: This exam was performed according to the departmental dose-optimization program which includes automated exposure control, adjustment of the mA and/or kV according to patient size and/or use of iterative reconstruction technique. Informed written consent was obtained from the patient after a discussion of the risks, benefits and alternatives to treatment. The patient was placed supine on the CT gantry and a pre procedural CT was performed re-demonstrating the known abscess/fluid collection within the right upper quadrant. The procedure was planned. A timeout was performed prior to the initiation of the procedure. The right upper quadrant was prepped and draped in the usual sterile fashion. The overlying soft tissues were anesthetized with 1% lidocaine  with epinephrine . Appropriate trajectory was planned with the use of a 22 gauge spinal needle. An 18 gauge trocar needle was advanced into the abscess/fluid collection and a short Amplatz super stiff wire was coiled within the collection. Appropriate positioning was confirmed with a limited CT scan. The tract was serially dilated allowing placement of a 10 French all-purpose drainage catheter. Appropriate positioning was confirmed with a limited postprocedural CT scan. Approximately 150 ml of purulent fluid was aspirated. The tube was connected to a bulb suction and sutured in place. A dressing was placed. The patient tolerated the procedure well without immediate post procedural complication.  IMPRESSION: Successful CT guided placement of a 10 French all purpose drain catheter into the right upper quadrant perihepatic abscess with aspiration of 150 mL of purulent fluid. Samples were sent to the laboratory as requested by the ordering clinical team. Ester Sides, MD [facility] Electronically Signed   By: Ester Sides M.D.   On: 08/28/2024 18:52   US  THORACENTESIS RIGHT ASP PLEURAL SPACE W/IMG GUIDE Result Date: 08/28/2024 INDICATION: 64 year old female with perforated gastric ulcer, hepatic abscess and right pleural effusion. Request for therapeutic and diagnostic thoracentesis. EXAM: ULTRASOUND GUIDED RIGHT THORACENTESIS MEDICATIONS: 10 mL 1% lidocaine  COMPLICATIONS: None immediate. PROCEDURE: An ultrasound guided thoracentesis was thoroughly discussed with the patient and questions answered. The benefits, risks, alternatives and complications were also discussed. The patient understands and wishes to proceed with the procedure. Written consent was obtained. Ultrasound was performed to localize and mark an adequate pocket of fluid in the right chest. The area was then prepped and draped in the normal sterile fashion. 1% Lidocaine  was used for local anesthesia. Under ultrasound guidance a 6 Fr Safe-T-Centesis catheter was introduced. Thoracentesis was performed. The catheter was removed and a dressing applied. FINDINGS: A total of approximately 900 mL of hazy amber fluid was removed. Samples were sent to the laboratory as requested by the clinical team. Post procedure chest X-ray reviewed, negative for pneumothorax. IMPRESSION: Successful ultrasound guided right thoracentesis yielding 900 mL of pleural fluid. Performed by: Aimee Han, PA-C. Electronically Signed   By: Ester Sides M.D.   On: 08/28/2024 13:57   DG Chest 1 View Result Date: 08/28/2024 EXAM: 1 VIEW(S) XRAY OF THE CHEST 08/28/2024 10:59:00 AM COMPARISON: 08/13/2024 CLINICAL  HISTORY: Pleural effusion on right. FINDINGS: LUNGS AND PLEURA: Low lung volumes with progressive basilar atelectasis. Confluent airspace opacities at right lung base. Small right pleural effusion. No pneumothorax. HEART AND MEDIASTINUM: Aortic atherosclerosis. No acute abnormality of the cardiac and mediastinal silhouettes. BONES AND SOFT TISSUES: No acute osseous abnormality. IMPRESSION: 1. Small right pleural effusion. 2. Low lung volumes with progressive basilar atelectasis and confluent right basilar airspace opacities. 3. Aortic atherosclerosis. Electronically signed by: Evalene Coho MD 08/28/2024 11:16 AM  EST RP Workstation: [id number]   CT ABDOMEN PELVIS W CONTRAST Result Date: 08/27/2024 EXAM: CT ABDOMEN AND PELVIS WITH CONTRAST 08/27/2024 09:02:43 PM TECHNIQUE: CT of the abdomen and pelvis was performed with the administration of intravenous contrast. Multiplanar reformatted images are provided for review. Automated exposure control, iterative reconstruction, and/or weight-based adjustment of the mA/kV was utilized to reduce the radiation dose to as low as reasonably achievable. COMPARISON: CT chest abdomen and pelvis 08/13/2024. CLINICAL HISTORY: Epigastric pain. FINDINGS: LOWER CHEST: There is a new moderate sized right pleural effusion with compressive atelectasis of the right lower lobe. There is new right middle lobe airspace consolidation with air bronchograms. LIVER: There is a new enhancing lateral right hepatic subcapsular fluid collection containing air measuring 12.3 x 3.8 x 16.1 cm. This abuts the anterior abdominal wall. GALLBLADDER AND BILE DUCTS: Gallbladder is unremarkable. No biliary ductal dilatation. SPLEEN: No acute abnormality. PANCREAS: No acute abnormality. ADRENAL GLANDS: No acute abnormality. KIDNEYS, URETERS AND BLADDER: There is a 3 mm calculus in the inferior pole of the right kidney. No hydronephrosis. No perinephric or periureteral stranding. Urinary bladder is  unremarkable. GI AND BOWEL: Air fluid tract is seen extending from the enhancing lateral right hepatic subcapsular fluid collection to the anterior superior gastric antrum best seen on coronal images 6/70-74. There is fluid distention of the distal esophagus, unchanged. There is wall thickening of the gastric antrum as seen on prior. There is mild inflammation surrounding the gastric antrum. The appendix appears normal. There is no bowel obstruction. PERITONEUM AND RETROPERITONEUM: No ascites. No free air. VASCULATURE: Aorta is normal in caliber. Atherosclerotic calcifications of the aorta and iliac arteries. LYMPH NODES: No lymphadenopathy. REPRODUCTIVE ORGANS: Uterus is not seen. BONES AND SOFT TISSUES: No acute osseous abnormality. There is scarring in the anterior abdominal wall. IMPRESSION: 1. Large right hepatic subcapsular abscess with fistulous communication to the gastric antrum. 2. Gastric antral wall thickening with mild surrounding inflammation, similar to prior. 3. New moderate sized right pleural effusion with compressive atelectasis of the right lower lobe and new right middle lobe airspace consolidation. Electronically signed by: Greig Pique MD 08/27/2024 09:25 PM EST RP Workstation: [id number]   ECHOCARDIOGRAM COMPLETE Result Date: 08/14/2024    ECHOCARDIOGRAM REPORT   Patient Name:   BHUMI GODBEY Date of Exam: 08/14/2024 Medical Rec #:  [id number]         Height:       64.0 in Accession #:    [id number]        Weight:       180.3 lb Date of Birth:  March 30, 1961          BSA:          1.872 m Patient Age:    63 years          BP:           105/56 mmHg Patient Gender: F                 HR:           100 bpm. Exam Location:  Inpatient Procedure: 2D Echo, Cardiac Doppler and Color Doppler (Both Spectral and Color            Flow Doppler were utilized during procedure). Indications:    Chest Pain R07.9  History:        Patient has no prior history of Echocardiogram examinations.                 LVH;  Risk Factors:Hypertension.  Sonographer:    Tinnie Gosling RDCS Referring Phys: [id number] DANIEL V THOMPSON IMPRESSIONS  1. Left ventricular ejection fraction, by estimation, is >75%. The left ventricle has hyperdynamic function. The left ventricle has no regional wall motion abnormalities. There is severe left ventricular hypertrophy of the apical segment. Left ventricular diastolic parameters are indeterminate.  2. Right ventricular systolic function is normal. The right ventricular size is normal. Tricuspid regurgitation signal is inadequate for assessing PA pressure.  3. The mitral valve is normal in structure. Trivial mitral valve regurgitation. No evidence of mitral stenosis.  4. The aortic valve has an indeterminant number of cusps. Aortic valve regurgitation is not visualized. No aortic stenosis is present.  5. The inferior vena cava is normal in size with greater than 50% respiratory variability, suggesting right atrial pressure of 3 mmHg. Comparison(s): No prior Echocardiogram. 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 Recommend cardiac MRI as an outpatient for further evaluation of apical HCM. The velocity through the aortic valve is elevated, but this likely represents an increased LV gradient from HCM rather than aortic stenosis. Recommend repeat echocardiogram in ~2-3 years to make sure. FINDINGS  Left Ventricle: Left ventricular ejection fraction, by estimation, is >75%. The left ventricle has hyperdynamic function. The left ventricle has no regional wall motion abnormalities. The left ventricular internal cavity size was small. There is severe left ventricular hypertrophy of the apical segment. Left ventricular diastolic parameters are indeterminate. Right Ventricle: The right ventricular size is normal. No increase in right ventricular wall thickness. Right ventricular systolic function is normal. Tricuspid regurgitation signal is inadequate  for assessing PA pressure. Left Atrium: Left atrial size was normal in size. Right Atrium: Right atrial size was normal in size. Pericardium: There is no evidence of pericardial effusion. Mitral Valve: The mitral valve is normal in structure. Trivial mitral valve regurgitation. No evidence of mitral valve stenosis. Tricuspid Valve: The tricuspid valve is normal in structure. Tricuspid valve regurgitation is not demonstrated. No evidence of tricuspid stenosis. Aortic Valve: The aortic valve has an indeterminant number of cusps. Aortic valve regurgitation is not visualized. No aortic stenosis is present. Aortic valve mean gradient measures 10.0 mmHg. Aortic valve peak gradient measures 19.0 mmHg. Aortic valve area, by VTI measures 3.53 cm. Pulmonic Valve: The pulmonic valve was normal in structure. Pulmonic valve regurgitation is not visualized. No evidence of pulmonic stenosis. Aorta: The aortic root and ascending aorta are structurally normal, with no evidence of dilitation. Venous: The inferior vena cava is normal in size with greater than 50% respiratory variability, suggesting right atrial pressure of 3 mmHg. IAS/Shunts: No atrial level shunt detected by color flow Doppler.  LEFT VENTRICLE PLAX 2D LVIDd:         4.60 cm   Diastology LVIDs:         2.20 cm   LV e' lateral:   9.03 cm/s LV PW:         1.40 cm   LV E/e' lateral: 9.7 LV IVS:        1.30 cm LVOT diam:     2.20 cm LV SV:         100 LV SV Index:   53 LVOT Area:     3.80 cm  RIGHT VENTRICLE         IVC TAPSE (M-mode): 2.3 cm  IVC diam: 1.20 cm LEFT ATRIUM             Index  RIGHT ATRIUM           Index LA diam:        3.30 cm 1.76 cm/m   RA Area:     12.90 cm LA Vol (A2C):   64.8 ml 34.61 ml/m  RA Volume:   27.00 ml  14.42 ml/m LA Vol (A4C):   46.4 ml 24.78 ml/m LA Biplane Vol: 54.9 ml 29.32 ml/m  AORTIC VALVE AV Area (Vmax):    3.38 cm AV Area (Vmean):   3.91 cm AV Area (VTI):     3.53 cm AV Vmax:           218.00 cm/s AV Vmean:           145.000 cm/s AV VTI:            0.283 m AV Peak Grad:      19.0 mmHg AV Mean Grad:      10.0 mmHg LVOT Vmax:         194.00 cm/s LVOT Vmean:        149.000 cm/s LVOT VTI:          0.263 m LVOT/AV VTI ratio: 0.93  AORTA Ao Root diam: 2.90 cm Ao Asc diam:  3.40 cm MITRAL VALVE MV Area (PHT): 6.88 cm    SHUNTS MV E velocity: 87.90 cm/s  Systemic VTI:  0.26 m MV A velocity: 87.30 cm/s  Systemic Diam: 2.20 cm MV E/A ratio:  1.01 Georganna Archer Electronically signed by Georganna Archer Signature Date/Time: 08/14/2024/3:27:18 PM    Final    DG Abd Portable 1V Result Date: 08/14/2024 EXAM: 1 VIEW XRAY OF THE ABDOMEN 08/14/2024 09:11:00 AM COMPARISON: CT chest abdomen and pelvis 08/13/2024. CLINICAL HISTORY: Abdominal pain FINDINGS: BOWEL: There is a paucity of bowel gas within the abdomen and pelvis, which limits assessment for underlying bowel pathology, specifically bowel obstruction. Within this limitation, no dilated bowel loops are noted. The paucity of bowel gas could represent a non-obstructive process, early or incomplete obstruction not yet causing significant dilation, or reduced bowel motility. SOFT TISSUES: A previous 3 mm right lower pole renal calculus is not currently seen. BONES: No acute fracture. IMPRESSION: 1. Paucity of bowel gas limits assessment for bowel obstruction, with no dilated bowel loops identified. 2. A previous 3 mm right lower pole renal calculus is not currently seen. This could indicate it has passed, is too small to be visualized on plain film, or is obscured. If there is a clinical concern for ureteral calculi, particularly if the patient has renal colic, consider further evaluation with a renal stone protocol CT. Electronically signed by: Waddell Calk MD 08/14/2024 09:18 AM EST RP Workstation: [id number]   US  RENAL Result Date: 08/14/2024 EXAM: US  Retroperitoneum Complete, Renal. 08/14/2024 07:14:38 AM TECHNIQUE: Real-time ultrasonography of the retroperitoneum renal was performed.  COMPARISON: CT 08/13/2024 CLINICAL HISTORY: [id number] AKI (acute kidney injury) 409830 AKI (acute kidney injury) FINDINGS: FINDINGS: RIGHT KIDNEY/URETER: The right kidney measures 11.2 x 5.3 x 6.1 cm with a volume of 187.6 cc. Normal cortical echogenicity. No hydronephrosis. Inferior pole right kidney stone measures 5 mm. No mass. LEFT KIDNEY/URETER: Left kidney measures 11.0 x 5.3 x 5.9 cm with a volume of 180.4 cc. Normal cortical echogenicity. No hydronephrosis. No calculus. No mass. BLADDER: Urinary bladder is decompressed. Patient voided prior to exam. IMPRESSION: 1. No acute findings. 2. Inferior pole right kidney stone measuring 5 mm. Electronically signed by: Waddell Calk MD 08/14/2024 07:21 AM EST RP Workstation: [id number]   CT  CHEST ABDOMEN PELVIS WO CONTRAST Result Date: 08/13/2024 EXAM: CT CHEST, ABDOMEN AND PELVIS WITHOUT CONTRAST 08/13/2024 11:46:00 PM TECHNIQUE: CT of the chest, abdomen and pelvis was performed without the administration of intravenous contrast. Multiplanar reformatted images are provided for review. Automated exposure control, iterative reconstruction, and/or weight based adjustment of the mA/kV was utilized to reduce the radiation dose to as low as reasonably achievable. COMPARISON: Chest radiograph 08/13/2024, CT abdomen and pelvis 08/12/2024, and CT chest 08/10/2023. CLINICAL HISTORY: Abdominal pain, acute, nonlocalized; post EGD and has severe pain. FINDINGS: CHEST: MEDIASTINUM AND LYMPH NODES: Heart and pericardium are unremarkable. The central airways are clear. No mediastinal, hilar or axillary lymphadenopathy. Diffusely dilated esophagus filled with air and fluid. This is similar to the prior studies and likely represents chronic dysmotility such as achalasia. LUNGS AND PLEURA: 4 mm right lower lung nodule, series 4 image 159. No change since prior study. No imaging follow-up is indicated by size criteria. No focal consolidation or pulmonary edema. No pleural effusion. No  pneumothorax. ABDOMEN AND PELVIS: LIVER: Unremarkable. GALLBLADDER AND BILE DUCTS: Increased density throughout the gallbladder likely representing vicarious contrast excretion. No bile duct dilatation. SPLEEN: No acute abnormality. PANCREAS: No acute abnormality. ADRENAL GLANDS: No acute abnormality. KIDNEYS, URETERS AND BLADDER: Right lower pole renal stone measuring 3 mm in diameter. No hydronephrosis or hydroureter. No perinephric or periureteral stranding. Residual contrast material in the bladder. No bladder wall thickening. GI AND BOWEL: The stomach is partially decompressed, but there is evidence of gastric wall thickening in the distal stomach, similar to prior study. Residual contrast material demonstrated in the colon. The small bowel and colon are decompressed. The appendix is normal. There is no bowel obstruction. REPRODUCTIVE ORGANS: The uterus appears surgically absent. No abnormal adnexal masses. PERITONEUM AND RETROPERITONEUM: No free air or free fluid in the abdomen or pelvis to suggest perforation. VASCULATURE: Calcification of the aorta. Aorta is normal in caliber. ABDOMINAL AND PELVIS LYMPH NODES: No lymphadenopathy. BONES AND SOFT TISSUES: Mild degenerative changes in the spine. Slight anterior subluxation of L4 on L5, likely degenerative. No change since prior study. No acute osseous abnormality. No focal soft tissue abnormality. IMPRESSION: 1. No acute findings. No evidence of bowel perforation . 2. Gastric wall thickening in the distal stomach, similar to prior study. 3. Esophageal dilatation suggests chronic esophageal dysmotility such as achalasia, similar to prior studies. 4. Nonobstructing 3 mm right lower pole renal stone. No hydronephrosis or hydroureter. Electronically signed by: Elsie Gravely MD 08/13/2024 11:55 PM EST RP Workstation: [id number]   DG Chest Port 1 View Result Date: 08/13/2024 EXAM: 1 VIEW(S) XRAY OF THE CHEST 08/13/2024 09:03:00 PM COMPARISON: CT chest 08/10/2023.  CLINICAL HISTORY: Abdominal pain. FINDINGS: LUNGS AND PLEURA: No focal pulmonary opacity. No vascular congestion, edema, or consolidation. No pleural effusion. No pneumothorax. HEART AND MEDIASTINUM: Mild cardiac enlargement. Mediastinal contours appear intact. Calcification of the aorta. BONES AND SOFT TISSUES: No acute osseous abnormality. IMPRESSION: 1. No acute cardiopulmonary findings. Electronically signed by: Elsie Gravely MD 08/13/2024 09:06 PM EST RP Workstation: [id number]   US  Abdomen Limited RUQ (LIVER/GB) Result Date: 08/12/2024 CLINICAL DATA:  Right upper quadrant abdominal pain EXAM: ULTRASOUND ABDOMEN LIMITED RIGHT UPPER QUADRANT COMPARISON:  June 12, 2016 FINDINGS: Gallbladder: No gallstones or wall thickening visualized. No sonographic Murphy sign noted by sonographer. Common bile duct: Diameter: 2 mm which is within normal limits. Liver: No focal lesion identified. Within normal limits in parenchymal echogenicity. Portal vein is patent on color Doppler imaging with normal direction of  blood flow towards the liver. Other: None. IMPRESSION: No definite abnormality seen in the right upper quadrant of the abdomen. Electronically Signed   By: Lynwood Landy Raddle M.D.   On: 08/12/2024 10:27   CT ABDOMEN PELVIS W CONTRAST Result Date: 08/12/2024 CLINICAL DATA:  Acute generalized abdominal pain EXAM: CT ABDOMEN AND PELVIS WITH CONTRAST TECHNIQUE: Multidetector CT imaging of the abdomen and pelvis was performed using the standard protocol following bolus administration of intravenous contrast. RADIATION DOSE REDUCTION: This exam was performed according to the departmental dose-optimization program which includes automated exposure control, adjustment of the mA and/or kV according to patient size and/or use of iterative reconstruction technique. CONTRAST:  OMNIPAQUE  IOHEXOL  300 MG/ML  SOLN COMPARISON:  February 15, 2018 FINDINGS: Lower chest: Visualized lung bases are unremarkable. Moderately dilated  esophagus is noted with moderate wall thickening suggesting esophagitis. Hepatobiliary: No focal liver abnormality is seen. No gallstones, gallbladder wall thickening, or biliary dilatation. Pancreas: Unremarkable. No pancreatic ductal dilatation or surrounding inflammatory changes. Spleen: Normal in size without focal abnormality. Adrenals/Urinary Tract: Adrenal glands are unremarkable. Kidneys are normal, without renal calculi, focal lesion, or hydronephrosis. Bladder is unremarkable. Stomach/Bowel: Moderate wall thickening of distal stomach is noted concerning for gastritis or peptic ulcer disease. Possible ulceration is noted along lesser curvature. Endoscopy is recommended. There is no evidence of bowel obstruction. Appendix is unremarkable. Vascular/Lymphatic: Aortic atherosclerosis. No enlarged abdominal or pelvic lymph nodes. Reproductive: Status post hysterectomy. No adnexal masses. Other: No abdominal wall hernia or abnormality. No abdominopelvic ascites. Musculoskeletal: No acute or significant osseous findings. IMPRESSION: 1. Moderate wall thickening of distal stomach is noted concerning for gastritis or peptic ulcer disease. Possible ulceration is noted along lesser curvature. Endoscopy is recommended. 2. Moderately dilated esophagus is noted with moderate wall thickening suggesting esophagitis. 3. Aortic atherosclerosis. Aortic Atherosclerosis (ICD10-I70.0). Electronically Signed   By: Lynwood Landy Raddle M.D.   On: 08/12/2024 10:02    Labs:  CBC: Recent Labs    09/02/24 0544 09/03/24 0542 09/04/24 0543 09/05/24 0226  WBC 4.8 6.1 5.8 5.4  HGB 9.1* 9.4* 9.6* 8.8*  HCT 28.3* 29.4* 30.4* 26.7*  PLT 388 671* 667* 634*    COAGS: Recent Labs    08/13/24 2127 08/28/24 0848  INR 1.0 1.2    BMP: Recent Labs    09/02/24 0544 09/03/24 0542 09/04/24 0543 09/05/24 0226  NA 139 139 141 141  K 4.3 4.3 4.1 3.6  CL 108 106 106 105  CO2 21* 23 26 27   GLUCOSE 87 83 76 88  BUN 5* <5* <5*  <5*  CALCIUM  8.9 9.2 9.2 9.1  CREATININE 0.62 0.68 0.73 0.75  GFRNONAA >60 >60 >60 >60    LIVER FUNCTION TESTS: Recent Labs    08/27/24 1821 08/28/24 0500 08/31/24 0350 09/01/24 0238  BILITOT 0.3 0.3 0.3 0.3  AST 31 32 20 28  ALT 46* 42 27 29  ALKPHOS 128* 129* 101 102  PROT 6.7 7.0 6.4* 6.7  ALBUMIN 2.4* 2.5* 2.3* 2.5*    TUMOR MARKERS: No results for input(s): AFPTM, CEA, CA199, CHROMGRNA in the last 8760 hours.  Assessment and Plan:  Case reviewed and patient approved for injection/exchange/upsize of existing RUQ drain as well as potential additional drain by Dr. Philip.  No contraindications for procedure identified in ROS, physical exam, or review of pre-sedation considerations. Available labs reviewed and within acceptable range. INR 1.2. Discussed with Dr. Philip, ok to proceed without repeat.  1/25 CT imaging available and reviewed VSS,  afebrile Patient does not take a blood thinner  Abx not indicated  Risks and benefits discussed with the patient including bleeding, infection, damage to adjacent structures, bowel perforation/fistula connection, and sepsis.  All of the patient's questions were answered, patient is agreeable to proceed. Consent signed and in chart.   Thank you for allowing our service to participate in [facility] 's care.    Electronically Signed: Laymon Coast, NP   09/05/2024, 10:07 AM     I spent a total of 20 Minutes    in face to face in clinical consultation, greater than 50% of which was counseling/coordinating care for image guided drain exchange vs new placement.    (A copy of this note was sent to the referring provider and the time of visit.)  "

## 2024-09-05 NOTE — Progress Notes (Signed)
 "    Subjective/Chief Complaint: WBC remains normal. Afebrile. CT yesterday shows a persistent large perihepatic abscess.  Objective: Vital signs in last 24 hours: Temp:  [98 F (36.7 C)-99.3 F (37.4 C)] 98.7 F (37.1 C) (01/26 0614) Pulse Rate:  [77-85] 77 (01/26 0614) BP: (136-166)/(84-99) 166/99 (01/26 0614) SpO2:  [91 %-96 %] 96 % (01/26 0614) Last BM Date : 09/04/24  Intake/Output from previous day: 01/25 0701 - 01/26 0700 In: -  Out: 125 [Drains:125] Intake/Output this shift: No intake/output data recorded.  WDWN in NAD Abd - soft, non-tender Drain - purulent, nonbilious fluid  Lab Results:  Recent Labs    09/04/24 0543 09/05/24 0226  WBC 5.8 5.4  HGB 9.6* 8.8*  HCT 30.4* 26.7*  PLT 667* 634*   BMET Recent Labs    09/04/24 0543 09/05/24 0226  NA 141 141  K 4.1 3.6  CL 106 105  CO2 26 27  GLUCOSE 76 88  BUN <5* <5*  CREATININE 0.73 0.75  CALCIUM  9.2 9.1    Anti-infectives: Anti-infectives (From admission, onward)    Start     Dose/Rate Route Frequency Ordered Stop   08/30/24 1245  Ampicillin -Sulbactam (UNASYN ) 3 g in sodium chloride  0.9 % 100 mL IVPB        3 g 200 mL/hr over 30 Minutes Intravenous Every 6 hours 08/30/24 1151     08/29/24 0815  micafungin  (MYCAMINE ) 100 mg in sodium chloride  0.9 % 100 mL IVPB        100 mg 105 mL/hr over 1 Hours Intravenous Every 24 hours 08/29/24 0807     08/28/24 1200  vancomycin  (VANCOREADY) IVPB 750 mg/150 mL  Status:  Discontinued        750 mg 150 mL/hr over 60 Minutes Intravenous Every 12 hours 08/27/24 2352 08/29/24 0926   08/28/24 1000  fluconazole  (DIFLUCAN ) tablet 400 mg        400 mg Oral Daily 08/27/24 2351 08/28/24 0928   08/28/24 0400  piperacillin -tazobactam (ZOSYN ) IVPB 3.375 g  Status:  Discontinued        3.375 g 12.5 mL/hr over 240 Minutes Intravenous Every 8 hours 08/27/24 2352 08/30/24 1150   08/27/24 2345  vancomycin  (VANCOREADY) IVPB 1750 mg/350 mL        1,750 mg 175 mL/hr over 120  Minutes Intravenous  Once 08/27/24 2343 08/28/24 0706   08/27/24 2245  piperacillin -tazobactam (ZOSYN ) IVPB 3.375 g        3.375 g 100 mL/hr over 30 Minutes Intravenous  Once 08/27/24 2239 08/27/24 2325       Assessment/Plan: 64 yo female with a known gastric antral ulcer, presenting with acute abdominal pain and a large upper abdominal abscess, consistent with a contained perforation. S/p percutaneous drain placement 1/18. - New CT shows a persistent large perihepatic collection, but this does not appear to communicate with the stomach. There is no perigastric free fluid or air to suggest on an ongoing gastric leak, and the patient remains clinically stable. Will consult IR for drain upsize vs placement of an additional drain into the perihepatic abscess. - NPO for now pending IR evaluation. Ok for soft diet after procedure. - UGI study 1/22 limited, suggests possible esophageal dilation but no apparent leak - soft diet - Abx per ID.   - Continue PPI and antibiotics - GI following. Will need repeat EGD in a few weeks to ensure resolution of ulcer and exclude an underlying malignancy.   LOS: 9 days    Jennifer [name]  L Malaiah Viramontes 09/05/2024  "

## 2024-09-05 NOTE — Progress Notes (Signed)
 [facility] Progress Note Patient: Jennifer [name] FMW:[id number] DOB: 04-Jan-1961  DOA: 08/27/2024 DOS: the patient was seen and examined on 09/05/2024  Brief Summary: Patient is a 64 y.o.  female with history of HTN, EtOH use, substance abuse (prior UDS positive for opiates/cocaine/benzos)-who recently had a admission for melena-EGD showed gastric ulcer-patient subsequently left AMA-presented with generalized weakness/melanotic stools/upper abdominal pain-she was found to have right hepatic abscess with fistulous communication to a contained gastric perforation.   Significant events: 1/17>> admit to [facility] 1/22>> underwent upper GI series.  Diet advanced. 1/25>> repeat CT abdomen pelvis with contrast shows persistent abscess. 1/26>> underwent upsizing of her drain with IR.  Developed fever after that.  Blood cultures ordered.  Remains on antibiotics.   Significant studies: 1/17>> CT abdomen/pelvis: Large right subhepatic subcapsular abscess with fistulous communication to the gastric antrum.  New moderate-sized right pleural effusion.  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 Currently on IV Unasyn .  And micafungin . Appreciate ID managing antibiotics. Appreciate General surgery and Gastroenterology consult. Upper GI series shows esophageal distention concerning  for achalasia and no evidence of leakage of contrast. Currently on soft diet now.  Continues to report severe pain requiring pain medication. Not recommended for home IV antibiotics.  Not a good candidate for long-term drain management at home as well. Discussed with ID as well as general surgery.  CT abdomen pelvis with contrast shows persistent abscess. IR was consulted to upsize her drain which was completed on 1/26. Continue with IV antibiotic. Due to fever repeat cultures were ordered.  Recent upper GI bleed (EGD on 1/3 showed candidiasis and gastric ulcer) Completed a course of fluconazole  On PPI twice daily-6-8 weeks   Right-sided pleural effusion S/p thoracocentesis on 1/18 Exudative by lights criteria-however cultures negative This is felt to be sympathetic effusion to hepatic abscess On empiric antibiotics as above   HTN BP stable-not on any antihypertensives   EtOH use Apparently has quit drinking-no withdrawal symptoms   History of substance abuse (UDS positive for benzo/opiates/cocaine on 1/4) Will counsel the patient prior to discharge   Noncompliance Continues to have poor insight in her medical condition. She has started to leave AMA multiple times. Multiple providers have counseled her again and again regarding the importance of current management. Multiple providers including myself have advised her of the life-threatening and life disabling risk of leaving AGAINST MEDICAL ADVICE.   Class 1 Obesity: Body mass index is 30.9 kg/m.  Placing the patient at high risk for him.   Normocytic acute on chronic blood loss anemia. Hemoglobin appears to be stable around 9.  Acute pain control. Patient reports severe pain in her abdomen. Currently able to tolerate oral soft diet but at the same time reports severe pain in her drain area. Continue current regimen of oxycodone  5 mg and 10 mg with Dilaudid  0.5 mg for refractory pain and muscle relaxer.  DVT  Prophylaxis: SCDs Start: 08/27/24 2352 Place TED hose Start: 08/27/24 2352   Data review I  have Reviewed nursing notes, Vitals, and Lab results. Since last encounter, pertinent lab results CBC and BMP   . I have ordered test including CBC and BMP  .    Physical exam. Vitals:   09/05/24 1155 09/05/24 1221 09/05/24 1543 09/05/24 1700  BP: (!) 140/89 (!) 156/97 (!) 159/91   Pulse: 82 84 87   Resp: (!) 30 20 18    Temp:  99.4 F (37.4 C) (!) 102 F (38.9 C) (!) 100.9 F (38.3 C)  TempSrc:  Oral Oral Oral  SpO2: 90% 92% 91%   Weight:      Height:       General: in Mild distress, No Rash Cardiovascular: S1 and S2 Present, No Murmur Respiratory: Diminished respiratory effort likely due to pain, Bilateral Air entry present. No Crackles, No wheezes. Abdomen: Bowel Sound present, right-sided tenderness Extremities: No edema Neuro: Alert and oriented x3, no new focal deficit Subjective: No nausea no vomiting no fever no chills.  Reported severe pain after the procedure.  Later in the day started having some fever.  Family Communication: No one at bedside  Disposition Plan: Status is: Inpatient Remains inpatient appropriate because: Monitor for improvement in infection and abscess.  Will require IV antibiotic and not recommended for home IV regimen.   Planned Discharge Destination: TBD Diet: Diet Order             DIET DYS 3 Room service appropriate? Yes; Fluid consistency: Thin  Diet effective now                   MEDICATIONS: Scheduled Meds:  cyclobenzaprine   5 mg Oral TID   feeding supplement  1 Container Oral TID BM   pantoprazole  (PROTONIX ) IV  40 mg Intravenous Q12H   traZODone   50 mg Oral QHS   Continuous Infusions:  ampicillin -sulbactam (UNASYN ) IV 3 g (09/05/24 1742)   micafungin  (MYCAMINE ) 100 mg in sodium chloride  0.9 % 100 mL IVPB 100 mg (09/05/24 1221)   PRN Meds:.acetaminophen  **OR** acetaminophen , HYDROmorphone  (DILAUDID ) injection, LORazepam  **OR**  LORazepam , oxyCODONE  **OR** oxyCODONE   Author: Yetta Blanch, MD  [facility] 09/05/2024  6:48 PM Between 7PM-7AM, please contact night-coverage, check [email or web address] for on call.

## 2024-09-05 NOTE — Progress Notes (Signed)
 Psychiatric Nurse Liaison (PNL) Rounding Note  Current SI/HI/AVH: Denies SI/HI/AVH  Patient Mood/Affect: Depression/anxiety/irritablity  Noted Patient Behaviors: Patient asked for prayers. Reports increasing depression and sadness over current hospitalization.   Interventions Initiated by Psychiatric Nurse Liaison: Assisted patient with dressing. Offered emotional support and therapeutic communication. Provided education on hospitalization and the importance of continuing current treatment plan.  Recommendations for Patient Care: Continue to educate patient on treatment plan.  Patients Response to Treatment: Effective  Time Spent with Patient:   10 mins

## 2024-09-05 NOTE — Procedures (Signed)
 Interventional Radiology Procedure:   Indications: Persistent perihepatic abscess  Procedure: Drain injection with drain manipulation and up size  Findings: Drain up sized to 14 Fr and then 16 Fr.   180 ml of tan fluid aspirated after rigorous aspiration.    Complications: None     EBL: Minimal  Plan: Continue with drain flushes Q8 hours.     Jalayia Bagheri R. Philip, MD  Pager: [phone]

## 2024-09-06 ENCOUNTER — Inpatient Hospital Stay (HOSPITAL_COMMUNITY)

## 2024-09-06 DIAGNOSIS — K922 Gastrointestinal hemorrhage, unspecified: Secondary | ICD-10-CM | POA: Diagnosis not present

## 2024-09-06 LAB — BASIC METABOLIC PANEL WITH GFR
Anion gap: 12 (ref 5–15)
BUN: <5 mg/dL — ABNORMAL LOW (ref 8–23)
CO2: 26 mmol/L (ref 22–32)
Calcium: 8.8 mg/dL — ABNORMAL LOW (ref 8.9–10.3)
Chloride: 105 mmol/L (ref 98–111)
Creatinine, Ser: 0.73 mg/dL (ref 0.44–1.00)
GFR, Estimated: >60 mL/min
Glucose, Bld: 85 mg/dL (ref 70–99)
Potassium: 3.8 mmol/L (ref 3.5–5.1)
Sodium: 142 mmol/L (ref 135–145)

## 2024-09-06 LAB — PHOSPHORUS: Phosphorus: 2.9 mg/dL (ref 2.5–4.6)

## 2024-09-06 NOTE — Progress Notes (Signed)
 "    Subjective/Chief Complaint: C/o her drain being too tight.  Not very hungry this morning and having pain after drain upsize yesterday.  Objective: Vital signs in last 24 hours: Temp:  [98.3 F (36.8 C)-102.5 F (39.2 C)] 98.3 F (36.8 C) (01/27 0200) Pulse Rate:  [79-88] 88 (01/26 2322) Resp:  [16-35] 16 (01/26 2322) BP: (132-159)/(70-97) 153/70 (01/27 0843) SpO2:  [90 %-100 %] 100 % (01/26 2322) Last BM Date : 09/04/24  Intake/Output from previous day: 01/26 0701 - 01/27 0700 In: 550 [P.O.:240; IV Piggyback:300] Out: 180 [Drains:180] Intake/Output this shift: No intake/output data recorded.  WDWN in NAD Abd - soft, upper abdominal tenderness Drain - purulent, nonbilious fluid, 180cc  Lab Results:  Recent Labs    09/04/24 0543 09/05/24 0226  WBC 5.8 5.4  HGB 9.6* 8.8*  HCT 30.4* 26.7*  PLT 667* 634*   BMET Recent Labs    09/05/24 0226 09/06/24 0241  NA 141 142  K 3.6 3.8  CL 105 105  CO2 27 26  GLUCOSE 88 85  BUN <5* <5*  CREATININE 0.75 0.73  CALCIUM  9.1 8.8*    Anti-infectives: Anti-infectives (From admission, onward)    Start     Dose/Rate Route Frequency Ordered Stop   08/30/24 1245  Ampicillin -Sulbactam (UNASYN ) 3 g in sodium chloride  0.9 % 100 mL IVPB        3 g 200 mL/hr over 30 Minutes Intravenous Every 6 hours 08/30/24 1151     08/29/24 0815  micafungin  (MYCAMINE ) 100 mg in sodium chloride  0.9 % 100 mL IVPB        100 mg 105 mL/hr over 1 Hours Intravenous Every 24 hours 08/29/24 0807     08/28/24 1200  vancomycin  (VANCOREADY) IVPB 750 mg/150 mL  Status:  Discontinued        750 mg 150 mL/hr over 60 Minutes Intravenous Every 12 hours 08/27/24 2352 08/29/24 0926   08/28/24 1000  fluconazole  (DIFLUCAN ) tablet 400 mg        400 mg Oral Daily 08/27/24 2351 08/28/24 0928   08/28/24 0400  piperacillin -tazobactam (ZOSYN ) IVPB 3.375 g  Status:  Discontinued        3.375 g 12.5 mL/hr over 240 Minutes Intravenous Every 8 hours 08/27/24 2352  08/30/24 1150   08/27/24 2345  vancomycin  (VANCOREADY) IVPB 1750 mg/350 mL        1,750 mg 175 mL/hr over 120 Minutes Intravenous  Once 08/27/24 2343 08/28/24 0706   08/27/24 2245  piperacillin -tazobactam (ZOSYN ) IVPB 3.375 g        3.375 g 100 mL/hr over 30 Minutes Intravenous  Once 08/27/24 2239 08/27/24 2325       Assessment/Plan: 64 yo female with a known gastric antral ulcer, presenting with acute abdominal pain and a large upper abdominal abscess, consistent with a contained perforation. S/p percutaneous drain placement 1/18. - New CT shows a persistent large perihepatic collection, but this does not appear to communicate with the stomach. There is no perigastric free fluid or air to suggest on an ongoing gastric leak, and the patient remains clinically stable. -IR with upsize of drain 1/26, cont to monitor -temp overnight of 102.5.  likely SIRS response from drain manipulation which isn't uncommon at times. -cbc in am, bmet reviewed - D3 diet - UGI study 1/22 limited, suggests possible esophageal dilation but no apparent leak - Abx per ID.   - Continue PPI and antibiotics - GI following. Will need repeat EGD in a few weeks  to ensure resolution of ulcer and exclude an underlying malignancy.   LOS: 10 days    Burnard FORBES Banter 09/06/2024  "

## 2024-09-06 NOTE — Plan of Care (Signed)
  Problem: Education: Goal: Knowledge of General Education information will improve Description: Including pain rating scale, medication(s)/side effects and non-pharmacologic comfort measures Outcome: Progressing   Problem: Nutrition: Goal: Adequate nutrition will be maintained Outcome: Progressing   Problem: Coping: Goal: Level of anxiety will decrease Outcome: Progressing   Problem: Activity: Goal: Ability to tolerate increased activity will improve Outcome: Progressing

## 2024-09-06 NOTE — TOC Progression Note (Signed)
 Transition of Care [facility]) - Progression Note    Patient Details  Name: MARVETTA VOHS MRN: [id number] Date of Birth: 1961-07-03  Transition of [facility]) CM/SW Contact  Landry DELENA Senters, RN Phone Number: 09/06/2024, 12:20 PM  Clinical Narrative:    IR upsized hepatic drain 1/26, continued medical workup.   CM will continue to follow.    Expected Discharge Plan: Home/Self Care Barriers to Discharge: Continued Medical Work up               Expected Discharge Plan and Services       Living arrangements for the past 2 months: Single Family Home                                       Social Drivers of Health (SDOH) Interventions SDOH Screenings   Food Insecurity: No Food Insecurity (08/29/2024)  Housing: Low Risk (08/29/2024)  Transportation Needs: No Transportation Needs (08/29/2024)  Recent Concern: Transportation Needs - Unmet Transportation Needs (08/14/2024)  Utilities: Not At Risk (08/29/2024)  Depression (PHQ2-9): Low Risk (05/24/2024)  Recent Concern: Depression (PHQ2-9) - Medium Risk (03/04/2024)  Tobacco Use: High Risk (08/28/2024)    Readmission Risk Interventions     No data to display

## 2024-09-06 NOTE — Progress Notes (Signed)
 Psychiatric Nurse Liaison (PNL) Rounding Note  Current SI/HI/AVH: pt refused to answer  Patient Mood/Affect: irritable  Noted Patient Behaviors: this clinical research associate and pt's RN in room; pt refusing medications and said she didn't want to be bothered and demanded to be left alone.  Interventions Initiated by Psychiatric Nurse Liaison: attempted therapeutic communication but was asked to leave the room  Recommendations for Patient Care: reinforce education on treatment plan when pt more agreeable  Patients Response to Treatment: unchanged  Time Spent with Patient:  <5 minutes    Loetta Pinal RN, BSN, RN-BC

## 2024-09-06 NOTE — Progress Notes (Signed)
 [facility] Progress Note Patient: Jennifer [name] FMW:[id number] DOB: November 07, 1960  DOA: 08/27/2024 DOS: the patient was seen and examined on 09/06/2024  Brief Summary: Patient is a 64 y.o.  female with history of HTN, EtOH use, substance abuse (prior UDS positive for opiates/cocaine/benzos)-who recently had a admission for melena-EGD showed gastric ulcer-patient subsequently left AMA-presented with generalized weakness/melanotic stools/upper abdominal pain-she was found to have right hepatic abscess with fistulous communication to a contained gastric perforation.   Significant events: 1/17>> admit to [facility] 1/22>> underwent upper GI series.  Diet advanced. 1/25>> repeat CT abdomen pelvis with contrast shows persistent abscess. 1/26>> underwent upsizing of her drain with IR.  Developed fever after that.  Blood cultures ordered.  Remains on antibiotics.   Significant studies: 1/17>> CT abdomen/pelvis: Large right subhepatic subcapsular abscess with fistulous communication to the gastric antrum.  New moderate-sized right pleural effusion.  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 Currently on IV Unasyn .  And micafungin . Appreciate ID managing antibiotics. Appreciate General surgery and Gastroenterology consult. Upper GI series shows esophageal distention concerning  for achalasia and no evidence of leakage of contrast. Currently on soft diet now.  Continues to report severe pain requiring pain medication. Not recommended for home IV antibiotics.  Not a good candidate for long-term drain management at home as well. Discussed with ID as well as general surgery.  CT abdomen pelvis with contrast shows persistent abscess. IR was consulted to upsize her drain which was completed on 1/26. Continue with IV antibiotic. Due to fever repeat cultures were ordered on 1/26.  So far negative. Reported an episode of nausea and vomiting on 1/27.  Monitor.  X-ray ordered.  Results pending.  Recent upper GI bleed (EGD on 1/3 showed candidiasis and gastric ulcer) Completed a course of fluconazole  On PPI twice daily-6-8 weeks   Right-sided pleural effusion S/p thoracocentesis on 1/18 Exudative by lights criteria-however cultures negative This is felt to be sympathetic effusion to hepatic abscess On empiric antibiotics as above   HTN BP stable-not on any antihypertensives   EtOH use Apparently has quit drinking-no withdrawal symptoms   History of substance abuse (UDS positive for benzo/opiates/cocaine on 1/4) Will counsel the patient prior to discharge   Noncompliance Continues to have poor insight in her medical condition. She has started to leave AMA multiple times. Multiple providers have counseled her again and again regarding the importance of current management. Multiple providers including myself have advised her of the life-threatening and life disabling risk of leaving AGAINST MEDICAL ADVICE.   Class 1 Obesity: Body mass index is 30.9 kg/m.  Placing the patient at high risk for him.   Normocytic acute on chronic blood loss anemia. Hemoglobin appears to be stable around 9.  Acute pain control. Patient reports severe pain in her abdomen. Currently able to tolerate oral soft diet but at the same time reports severe pain in her drain area. Continue  current regimen of oxycodone  5 mg and 10 mg with Dilaudid  0.5 mg for  refractory pain and muscle relaxer.  DVT Prophylaxis: SCDs Start: 08/27/24 2352 Place TED hose Start: 08/27/24 2352   Data review I have Reviewed nursing notes, Vitals, and Lab results. Since last encounter, pertinent lab results  BMP   . I have ordered test including CBC and BMP  . I have ordered imaging x-ray abdomen  .   Physical exam. Vitals:   09/05/24 1700 09/05/24 2322 09/06/24 0200 09/06/24 0843  BP:  (!) 139/94  (!) 153/70  Pulse:  88    Resp:  16    Temp: (!) 100.9 F (38.3 C) (!) 102.5 F (39.2 C) 98.3 F (36.8 C)   TempSrc: Oral Oral Oral   SpO2:  100%    Weight:      Height:       General: in Mild distress, No Rash Cardiovascular: S1 and S2 Present, No Murmur Respiratory: Good respiratory effort, Bilateral Air entry present. No Crackles, No wheezes Abdomen: Bowel Sound present, right-sided tenderness unchanged. Extremities: No edema Neuro: Alert and oriented x3, no new focal deficit  Subjective: Reported 1 episode of nausea and vomiting this morning.  Unwitnessed.  Appears to have tolerated her lunch.  Had a BM.  Family Communication: No one at bedside.  Disposition Plan: Status is: Inpatient Remains inpatient appropriate because: Currently on IV antibiotic.  Monitor for resolution of abscess with eventual drain removal.   Planned Discharge Destination: TBD Diet: Diet Order             DIET DYS 3 Room service appropriate? Yes; Fluid consistency: Thin  Diet effective now                   MEDICATIONS: Scheduled Meds:  cyclobenzaprine   5 mg Oral TID   feeding supplement  1 Container Oral TID BM   pantoprazole  (PROTONIX ) IV  40 mg Intravenous Q12H   traZODone   50 mg Oral QHS   Continuous Infusions:  ampicillin -sulbactam (UNASYN ) IV 3 g (09/06/24 1903)   micafungin  (MYCAMINE ) 100 mg in sodium chloride  0.9 % 100 mL IVPB 100 mg (09/06/24 0851)   PRN Meds:.acetaminophen  **OR**  acetaminophen , HYDROmorphone  (DILAUDID ) injection, LORazepam  **OR** LORazepam , oxyCODONE  **OR** oxyCODONE   Author: Yetta Blanch, MD  [facility] 09/06/2024  7:05 PM Between 7PM-7AM, please contact night-coverage, check [email or web address] for on call.

## 2024-09-07 ENCOUNTER — Inpatient Hospital Stay (HOSPITAL_COMMUNITY)

## 2024-09-07 DIAGNOSIS — K922 Gastrointestinal hemorrhage, unspecified: Secondary | ICD-10-CM | POA: Diagnosis not present

## 2024-09-07 LAB — CBC
HCT: 27.3 % — ABNORMAL LOW (ref 36.0–46.0)
Hemoglobin: 8.9 g/dL — ABNORMAL LOW (ref 12.0–15.0)
MCH: 28.3 pg (ref 26.0–34.0)
MCHC: 32.6 g/dL (ref 30.0–36.0)
MCV: 86.7 fL (ref 80.0–100.0)
Platelets: 561 10*3/uL — ABNORMAL HIGH (ref 150–400)
RBC: 3.15 MIL/uL — ABNORMAL LOW (ref 3.87–5.11)
RDW: 14.6 % (ref 11.5–15.5)
WBC: 6.1 10*3/uL (ref 4.0–10.5)
nRBC: 0 % (ref 0.0–0.2)

## 2024-09-07 LAB — BASIC METABOLIC PANEL WITH GFR
Anion gap: 10 (ref 5–15)
BUN: <5 mg/dL — ABNORMAL LOW (ref 8–23)
CO2: 26 mmol/L (ref 22–32)
Calcium: 8.8 mg/dL — ABNORMAL LOW (ref 8.9–10.3)
Chloride: 103 mmol/L (ref 98–111)
Creatinine, Ser: 0.84 mg/dL (ref 0.44–1.00)
GFR, Estimated: >60 mL/min
Glucose, Bld: 81 mg/dL (ref 70–99)
Potassium: 4 mmol/L (ref 3.5–5.1)
Sodium: 138 mmol/L (ref 135–145)

## 2024-09-07 LAB — PHOSPHORUS: Phosphorus: 2.8 mg/dL (ref 2.5–4.6)

## 2024-09-07 MED ORDER — ACETAMINOPHEN 10 MG/ML IV SOLN: 1000.0000 mg | Freq: Once | INTRAVENOUS | Status: DC

## 2024-09-07 MED ORDER — IOHEXOL 9 MG/ML PO SOLN
500.0000 mL | ORAL | Status: AC
  Administered 2024-09-07: 500 mL via ORAL

## 2024-09-07 MED ORDER — ACETAMINOPHEN 10 MG/ML IV SOLN
1000.0000 mg | INTRAVENOUS | Status: AC
  Administered 2024-09-07: 1000 mg via INTRAVENOUS
  Filled 2024-09-07: qty 100

## 2024-09-07 MED ORDER — HYDROMORPHONE HCL 1 MG/ML IJ SOLN
0.5000 mg | INTRAMUSCULAR | Status: DC | PRN
  Administered 2024-09-07 – 2024-09-14 (×28): 0.5 mg via INTRAVENOUS
  Filled 2024-09-07 (×29): qty 0.5

## 2024-09-07 MED ORDER — IOHEXOL 350 MG/ML SOLN
75.0000 mL | Freq: Once | INTRAVENOUS | Status: AC | PRN
  Administered 2024-09-07: 75 mL via INTRAVENOUS

## 2024-09-07 NOTE — Progress Notes (Signed)
 "   Referring Physician(s): Dasie Best  Supervising Physician: Vanice Revel  Patient Status:  [facility] - In-pt  Chief Complaint: persistent perihepatic abscess; s/p 10 Fr drain placement on 1/18 by Dr Jennefer; upsized to 16 Fr on 1/26 by Dr Philip  Subjective: Patient sitting up in bed. Reports ongoing abdominal pain, worsened since drain upsize. States she has been nauseous and thrown up today.   Allergies: Patient has no known allergies.  Medications: Prior to Admission medications  Medication Sig Start Date End Date Taking? Authorizing Provider  [Paused] amLODipine  (NORVASC ) 10 MG tablet TAKE 1 TABLET (10 MG TOTAL) BY MOUTH DAILY (Office visit for future refills) Wait to take this until your doctor or other care provider tells you to start again. 07/14/24   Newlin, Enobong, MD  atorvastatin  (LIPITOR) 10 MG tablet Take 1 tablet (10 mg total) by mouth daily. Patient not taking: Reported on 08/28/2024 07/14/24   Newlin, Enobong, MD  Blood Pressure Monitoring (BLOOD PRESSURE CUFF) MISC 1 Units by Does not apply route daily. 05/24/24   Mayers, Cari S, PA-C  dicyclomine  (BENTYL ) 20 MG tablet Take 1 tablet (20 mg total) by mouth 3 (three) times daily as needed for spasms. Patient not taking: Reported on 08/28/2024 08/22/24 09/21/24  Christobal Guadalajara, MD  folic acid  (FOLVITE ) 1 MG tablet Take 1 tablet (1 mg total) by mouth daily. Patient not taking: Reported on 08/28/2024 08/22/24 09/21/24  Christobal Guadalajara, MD  [Paused] lisinopril -hydrochlorothiazide  (ZESTORETIC ) 20-12.5 MG tablet Take 2 tablets by mouth daily. Wait to take this until your doctor or other care provider tells you to start again. 07/14/24   Newlin, Enobong, MD  metoprolol  succinate (TOPROL -XL) 50 MG 24 hr tablet Take 1 tablet (50 mg total) by mouth daily. Patient not taking: Reported on 08/28/2024 07/14/24   Newlin, Enobong, MD  oxyCODONE -acetaminophen  (PERCOCET/ROXICET) 5-325 MG tablet Take 1 tablet by mouth every 6 (six) hours as needed for moderate  pain (pain score 4-6). Patient not taking: Reported on 08/28/2024 08/22/24   Christobal Guadalajara, MD  pantoprazole  (PROTONIX ) 40 MG tablet Take 1 tablet (40 mg total) by mouth 2 (two) times daily. Patient not taking: Reported on 08/28/2024 08/22/24 09/21/24  Christobal Guadalajara, MD  thiamine  (VITAMIN B-1) 100 MG tablet Take 1 tablet (100 mg total) by mouth daily. Patient not taking: Reported on 08/28/2024 08/22/24 09/21/24  Christobal Guadalajara, MD     Vital Signs: BP 125/83 (BP Location: Left Arm)   Pulse (!) 59   Temp (!) 100.8 F (38.2 C) (Oral)   Resp 17   Ht 5' 4 (1.626 m)   Wt 180 lb (81.6 kg)   LMP 12/04/2009   SpO2 100%   BMI 30.90 kg/m   Physical Exam Cardiovascular:     Rate and Rhythm: Bradycardia present.  Pulmonary:     Effort: Pulmonary effort is normal. No respiratory distress.  Abdominal:     Tenderness: There is abdominal tenderness.     Comments: Epigastric and RUQ tenderness RUQ drain- Flushes/aspirates easily Green purulent drainage with few bits of broccoli florets in bag Insertion site unremarkable Suture and stat lock in place Dressed appropriately  Skin:    General: Skin is warm.  Neurological:     Mental Status: She is alert.     Imaging: DG Chest Port 1 View Result Date: 09/07/2024 EXAM: 1 VIEW(S) XRAY OF THE CHEST 09/07/2024 06:00:00 AM COMPARISON: 08/29/2024 CLINICAL HISTORY: Shortness of breath. FINDINGS: LUNGS AND PLEURA: Mild pulmonary edema. Small right pleural effusion. Bibasilar airspace  opacities. No pneumothorax. HEART AND MEDIASTINUM: Cardiomegaly. Aortic arch atherosclerosis. BONES AND SOFT TISSUES: No acute osseous abnormality. IMPRESSION: 1. Mild pulmonary edema, small right pleural effusion, and bibasilar airspace opacities. 2. Cardiomegaly and aortic arch atherosclerosis. Electronically signed by: Evalene Coho MD 09/07/2024 06:31 AM EST RP Workstation: [id number]   DG Abd Portable 1V Result Date: 09/06/2024 EXAM: 1 VIEW XRAY OF THE ABDOMEN 09/06/2024  03:53:00 PM COMPARISON: 08/14/2024 CLINICAL HISTORY: Intractable nausea and vomiting. ICD10: 379885 Intractable nausea and vomiting. FINDINGS: LINES, TUBES AND DEVICES: Pigtail catheter in right mid abdomen. BOWEL: Paucity of bowel gas throughout the abdomen. SOFT TISSUES: No abnormal calcifications. BONES: No acute fracture. LUNG BASES: Small right pleural effusion and patchy opacities at right lung base. IMPRESSION: 1. Paucity of bowel gas throughout the abdomen. 2. Small right pleural effusion and patchy opacities at the right lung base. Electronically signed by: Oneil Devonshire MD 09/06/2024 10:45 PM EST RP Workstation: [id number]   IR Catheter Tube Change Result Date: 09/05/2024 INDICATION: 64 year old with history of a perforated gastric ulcer and perihepatic abscess. Percutaneous drain was placed in the perihepatic abscess on 08/28/2024. Follow-up imaging demonstrates a large residual perihepatic collection. EXAM: 1. DRAIN INJECTION 2. DRAIN UPSIZE AND REPOSITIONING MEDICATIONS: Moderate sedation ANESTHESIA/SEDATION: Moderate (conscious) sedation was employed during this procedure. A total of Versed  2 mg and fentanyl  175 mcg was administered intravenously at the order of the provider performing the procedure. Total intra-service moderate sedation time: 30 minutes. Patient's level of consciousness and vital signs were monitored continuously by radiology nurse throughout the procedure under the supervision of the provider performing the procedure. COMPLICATIONS: None immediate. CONTRAST:  15 mL Omnipaque  300 FLUOROSCOPY TIME:  Radiation Exposure Index (as provided by the fluoroscopic device): 24 mGy Kerma PROCEDURE: Informed written consent was obtained from the patient after a thorough discussion of the procedural risks, benefits and alternatives. All questions were addressed. Maximal Sterile Barrier Technique was utilized including caps, mask, sterile gowns, sterile gloves, sterile drape, hand hygiene and skin  antiseptic. A timeout was performed prior to the initiation of the procedure. The right upper abdomen and existing drain were prepped and draped in sterile fashion. Ultrasound demonstrated heterogeneous hyperechoic material in the perihepatic collection. Scout image was obtained. Contrast was injected through the existing drain with fluoroscopic guidance. Contrast filled the large collection. Skin around the drain was anesthetized with 1% lidocaine . The drain was cut and removed over a superstiff Amplatz wire. Kumpe catheter was advanced into the collection and repositioned in the medial and superior aspect of the collection. The tract was dilated to accommodate a 14 French multipurpose drain. Large amount of thick tan colored fluid with small pieces was aspirated. It was difficult to aspirate because the catheter was occluding with the small pieces. Rigorous irrigation was needed to remove the fluid. Due to the small pieces that were occluding the catheter, the drain was exchanged for a 16 French drain. Additional irrigation and aspiration was performed. Drain was positioned along the cephalad aspect of the collection. Drain was attached to a gravity bag. Drain was sutured in place and a bandage was placed. FINDINGS: Ultrasound demonstrated a large heterogeneous and hyperechoic collection around the liver. Contrast injection demonstrates a large perihepatic collection. Large amount of tan colored thick fluid was removed along with small pieces and that were intermittently occluding the drain. At the end of the procedure, the drain was positioned more cephalad within the collection. Approximately 180 mL of tan colored fluid was removed. IMPRESSION: Perihepatic abscess drain was up  sized and repositioned using fluoroscopic guidance. 180 mL of thick fluid was removed. Electronically Signed   By: Juliene Balder M.D.   On: 09/05/2024 13:27   CT ABDOMEN PELVIS W CONTRAST Result Date: 09/04/2024 EXAM: CT ABDOMEN AND PELVIS  WITH CONTRAST 09/04/2024 08:13:26 PM TECHNIQUE: CT of the abdomen and pelvis was performed with the administration of 75 mL of iohexol  (OMNIPAQUE ) 350 MG/ML injection. Multiplanar reformatted images are provided for review. Automated exposure control, iterative reconstruction, and/or weight-based adjustment of the mA/kV was utilized to reduce the radiation dose to as low as reasonably achievable. COMPARISON: Comparison with 08/27/2024. CLINICAL HISTORY: Intra-abdominal abscess. Intra-abdominal abscess. FINDINGS: LOWER CHEST: Bilateral pleural effusions, greater on the right. Atelectasis or consolidation in the lung bases, also greater on the right. This could represent compressive atelectasis or pneumonia. The esophagus is diffusely dilated and filled with ingested material suggesting dysmotility or achalasia. No obstructing lesion is identified. LIVER: Subcapsular abscess in the anterior liver with air-fluid level measuring 5.6 x 9.5 cm diameter. The size is similar to the prior study. A pigtail drainage catheter is in place within the collection. Mild stranding in the soft tissues around the liver edge. GALLBLADDER AND BILE DUCTS: Gallbladder is unremarkable. No biliary ductal dilatation. SPLEEN: No acute abnormality. PANCREAS: No acute abnormality. ADRENAL GLANDS: No acute abnormality. KIDNEYS, URETERS AND BLADDER: 5 mm stone in the lower pole of the right kidney. No hydronephrosis or hydroureter. Nephrograms are symmetrical. No perinephric or periureteral stranding. Urinary bladder is unremarkable. GI AND BOWEL: The stomach, small bowel, and colon are not abnormally distended. Contrast material is present in the colon, suggesting no evidence of obstruction. Appendix is normal. PERITONEUM AND RETROPERITONEUM: No ascites. No free air. VASCULATURE: Aorta is normal in caliber. Calcification of the aorta. No aneurysm. LYMPH NODES: No lymphadenopathy. REPRODUCTIVE ORGANS: The uterus appears surgically absent. No  abnormal adnexal masses. BONES AND SOFT TISSUES: Degenerative changes in the spine. Slight anterior subluxation of L4 on L5. No acute bony abnormalities. Motion artifact limits evaluation. No focal soft tissue abnormality. IMPRESSION: 1. Subcapsular abscess in the anterior liver with air-fluid level measuring 5.6 x 9.5 cm diameter, similar to the prior study, with a pigtail drainage catheter in place within the collection. 2. Bilateral pleural effusions, greater on the right, and atelectasis or consolidation in the lung bases, also greater on the right, which may represent compressive atelectasis or pneumonia. 3. Esophageal dysmotility or achalasia with no obstructing lesion identified. Electronically signed by: Elsie Gravely MD 09/04/2024 08:21 PM EST RP Workstation: [id number]    Labs:  CBC: Recent Labs    09/03/24 0542 09/04/24 0543 09/05/24 0226 09/07/24 0232  WBC 6.1 5.8 5.4 6.1  HGB 9.4* 9.6* 8.8* 8.9*  HCT 29.4* 30.4* 26.7* 27.3*  PLT 671* 667* 634* 561*    COAGS: Recent Labs    08/13/24 2127 08/28/24 0848  INR 1.0 1.2    BMP: Recent Labs    09/04/24 0543 09/05/24 0226 09/06/24 0241 09/07/24 0232  NA 141 141 142 138  K 4.1 3.6 3.8 4.0  CL 106 105 105 103  CO2 26 27 26 26   GLUCOSE 76 88 85 81  BUN <5* <5* <5* <5*  CALCIUM  9.2 9.1 8.8* 8.8*  CREATININE 0.73 0.75 0.73 0.84  GFRNONAA >60 >60 >60 >60    LIVER FUNCTION TESTS: Recent Labs    08/27/24 1821 08/28/24 0500 08/31/24 0350 09/01/24 0238  BILITOT 0.3 0.3 0.3 0.3  AST 31 32 20 28  ALT 46* 42 27  29  ALKPHOS 128* 129* 101 102  PROT 6.7 7.0 6.4* 6.7  ALBUMIN 2.4* 2.5* 2.3* 2.5*    Assessment and Plan: Persistent perihepatic abscess-  Febrile today, T max 101.6 Blood cultures from yesterday 1/27-pending  CT abdomen ordered by [facility]-pending  Patient reports she ate broccoli last night and this morning. A few bits of broccoli florets present in gravity bag.  Drain Location: RUQ Size: 16 Fr Date  of placement: 09/05/2024  Currently to: Drain collection device: gravity 24 hour output:  Output by Drain (mL) 09/05/24 0701 - 09/05/24 1900 09/05/24 1901 - 09/06/24 0700 09/06/24 0701 - 09/06/24 1900 09/06/24 1901 - 09/07/24 0700 09/07/24 0701 - 09/07/24 1439  Closed System Drain 1 RLQ Bulb (JP) 16 Fr. 30   50     Current examination: Flushes/aspirates easily.  Green purulent drainage with few bits of broccoli florets in bag. Insertion site unremarkable. Suture and stat lock in place. Dressed appropriately.   Plan: Continue TID flushes with 5 cc NS. Record output Q shift. Dressing changes QD or PRN if soiled.  Call IR APP or on call IR MD if difficulty flushing or sudden change in drain output.  Repeat imaging/possible drain injection once output < 10 mL/QD (excluding flush material). Consideration for drain removal if output is < 10 mL/QD (excluding flush material), pending discussion with the providing surgical service.  Discharge planning: Please contact IR APP or on call IR MD prior to patient d/c to ensure appropriate follow up plans are in place. Typically patient will follow up with IR clinic 10-14 days post d/c for repeat imaging/possible drain injection. IR scheduler will contact patient with date/time of appointment. Patient will need to flush drain QD with 5 cc NS, record output QD, dressing changes every 2-3 days or earlier if soiled.   IR will continue to follow - please call with questions or concerns.     Electronically Signed: Erle Guster B Athea Haley, NP 09/07/2024, 2:33 PM   I spent a total of 15 Minutes at the the patient's bedside AND on the patient's hospital floor or unit, greater than 50% of which was counseling/coordinating care for drain follow up.  "

## 2024-09-07 NOTE — Plan of Care (Signed)
  Problem: Education: Goal: Knowledge of General Education information will improve Description: Including pain rating scale, medication(s)/side effects and non-pharmacologic comfort measures Outcome: Progressing   Problem: Clinical Measurements: Goal: Respiratory complications will improve Outcome: Progressing   Problem: Safety: Goal: Ability to remain free from injury will improve Outcome: Progressing   

## 2024-09-07 NOTE — Progress Notes (Signed)
 "    Subjective/Chief Complaint: Continues to complain of more abdominal pain since drain upsized 2 days ago.  Temp overnight of 101.6.  not able to eat much.  Did eat a little bit of broccoli at breakfast this am.  Objective: Vital signs in last 24 hours: Temp:  [99.4 F (37.4 C)-101.6 F (38.7 C)] 100 F (37.8 C) (01/28 0756) Pulse Rate:  [90-99] 90 (01/28 0756) Resp:  [18] 18 (01/28 0756) BP: (119-140)/(70-82) 119/71 (01/28 0756) Last BM Date : 09/04/24  Intake/Output from previous day: 01/27 0701 - 01/28 0700 In: -  Out: 50 [Drains:50] Intake/Output this shift: No intake/output data recorded.  WDWN in NAD Abd - soft, upper abdominal tenderness Drain - purulent, minimal output currently, but bits of broccoli in drain, 50cc yesterday of output  Lab Results:  Recent Labs    09/05/24 0226 09/07/24 0232  WBC 5.4 6.1  HGB 8.8* 8.9*  HCT 26.7* 27.3*  PLT 634* 561*   BMET Recent Labs    09/06/24 0241 09/07/24 0232  NA 142 138  K 3.8 4.0  CL 105 103  CO2 26 26  GLUCOSE 85 81  BUN <5* <5*  CREATININE 0.73 0.84  CALCIUM  8.8* 8.8*    Anti-infectives: Anti-infectives (From admission, onward)    Start     Dose/Rate Route Frequency Ordered Stop   08/30/24 1245  Ampicillin -Sulbactam (UNASYN ) 3 g in sodium chloride  0.9 % 100 mL IVPB        3 g 200 mL/hr over 30 Minutes Intravenous Every 6 hours 08/30/24 1151     08/29/24 0815  micafungin  (MYCAMINE ) 100 mg in sodium chloride  0.9 % 100 mL IVPB        100 mg 105 mL/hr over 1 Hours Intravenous Every 24 hours 08/29/24 0807     08/28/24 1200  vancomycin  (VANCOREADY) IVPB 750 mg/150 mL  Status:  Discontinued        750 mg 150 mL/hr over 60 Minutes Intravenous Every 12 hours 08/27/24 2352 08/29/24 0926   08/28/24 1000  fluconazole  (DIFLUCAN ) tablet 400 mg        400 mg Oral Daily 08/27/24 2351 08/28/24 0928   08/28/24 0400  piperacillin -tazobactam (ZOSYN ) IVPB 3.375 g  Status:  Discontinued        3.375 g 12.5 mL/hr  over 240 Minutes Intravenous Every 8 hours 08/27/24 2352 08/30/24 1150   08/27/24 2345  vancomycin  (VANCOREADY) IVPB 1750 mg/350 mL        1,750 mg 175 mL/hr over 120 Minutes Intravenous  Once 08/27/24 2343 08/28/24 0706   08/27/24 2245  piperacillin -tazobactam (ZOSYN ) IVPB 3.375 g        3.375 g 100 mL/hr over 30 Minutes Intravenous  Once 08/27/24 2239 08/27/24 2325       Assessment/Plan: 64 yo female with a known gastric antral ulcer, presenting with acute abdominal pain and a large upper abdominal abscess, consistent with a contained perforation. S/p percutaneous drain placement 1/18. -IR with upsize of drain 1/26, cont to monitor -temp overnight of 101.6.  likely SIRS response from drain manipulation which isn't uncommon at times; however she had broccoli for breakfast this am and now there is some pieces of broccoli florets in her drain which is concerning - will order a CT scan A/P with oral/IV contrast to better assess for gastric perforation (hopefully contained still within this abscess cavity) -  WBC still normal, HR is normal as is BP - NPO due to above concern - UGI study 1/22  limited, suggests possible esophageal dilation but no apparent leak - Abx per ID.   - Continue PPI and antibiotics - Will need repeat EGD in a few weeks to ensure resolution of ulcer and exclude an underlying malignancy. -d/w primary service   LOS: 11 days    Burnard FORBES Banter 09/07/2024  "

## 2024-09-07 NOTE — Progress Notes (Signed)
 [facility] Progress Note Patient: Jennifer [name] FMW:[id number] DOB: Aug 16, 1960  DOA: 08/27/2024 DOS: the patient was seen and examined on 09/07/2024  Brief Summary: Patient is a 64 y.o.  female with history of HTN, EtOH use, substance abuse (prior UDS positive for opiates/cocaine/benzos)-who recently had a admission for melena-EGD showed gastric ulcer-patient subsequently left AMA-presented with generalized weakness/melanotic stools/upper abdominal pain-she was found to have right hepatic abscess with fistulous communication to a contained gastric perforation.   Significant events: 1/17>> admit to [facility] 1/22>> underwent upper GI series.  Diet advanced. 1/25>> repeat CT abdomen pelvis with contrast shows persistent abscess. 1/26>> underwent upsizing of her drain with IR.  Developed fever after that.  Blood cultures ordered.  Remains on antibiotics.   Significant studies: 1/17>> CT abdomen/pelvis: Large right subhepatic subcapsular abscess with fistulous communication to the gastric antrum.  New moderate-sized right pleural effusion.  1/22>> upper GI series esophageal distention with severe esophageal dysmotility   Significant microbiology data: 1/18>> blood culture: No growth 1/18>> pleural fluid culture: No growth 1/18>> right hepatic abscess culture: Lactobacillus/rare yeast   Procedures: 1/18>> CT-guided right perihepatic abscess drain placement 1/18>> thoracocentesis by IR   Consults: [facility] IR General surgery ID   Assessment and Plan: Right hepatic abscess secondary to contained gastric perforation with fistulous communication Contained gastric perforation with fistulous communication with right hepatic abscess S/p CT-guided drain placement by IR on 1/18, upsized on 09/05/2024. Currently on Unasyn  and micafungin  combination Upper GI series shows esophageal distention concerning for achalasia and no evidence of leakage of contrast.  Tolerating soft diet ID, general  surgery and IR following, case discussed with ID as well as general surgery again on 09/07/2024.  CT abdomen pelvis with contrast shows persistent abscess.  Her drain was upsized by IR on 09/05/2024. Still has low-grade fevers IR and general surgery following, follow repeat cultures defer management to ID, IR and general surgery. Not recommended for home IV antibiotics.  Not a good candidate for long-term drain management at home as well.   Recent upper GI bleed (EGD on 1/3 showed candidiasis and gastric ulcer) Completed a course of fluconazole  On PPI twice daily-6-8 weeks   Right-sided pleural effusion S/p thoracocentesis on 1/18 Exudative by lights criteria-however cultures negative This is felt to be sympathetic effusion to hepatic abscess On empiric antibiotics as above   HTN BP stable-not on any antihypertensives   EtOH use Apparently has quit drinking-no withdrawal symptoms   History of substance abuse (UDS positive for benzo/opiates/cocaine on 1/4) Will counsel the patient prior to discharge   Noncompliance Continues to have poor insight in her medical condition. She has started to leave AMA multiple times. Multiple providers have counseled her again and again regarding the importance of current management. Multiple providers including myself have advised her of the life-threatening and life disabling risk of leaving AGAINST MEDICAL ADVICE.   Class 1 Obesity: Body mass index is 30.9 kg/m.  Placing the patient at high risk for him.   Normocytic acute on chronic blood loss anemia. Hemoglobin appears to be stable around 9.  Acute pain control. Patient reports severe pain in her abdomen. Currently able to tolerate oral soft diet but at the same time reports severe pain in her drain area. Continue current regimen of oxycodone  5 mg and 10 mg with Dilaudid  0.5 mg for refractory pain and muscle relaxer.   DVT Prophylaxis: SCDs Start: 08/27/24 2352 Place TED hose Start:  08/27/24 2352   Data review I have Reviewed nursing  notes, Vitals, and Lab results. Since last encounter, pertinent lab results  BMP   . I have ordered test including CBC and BMP  . I have ordered imaging x-ray abdomen  .   Physical exam.  Vitals:   09/06/24 2209 09/07/24 0126 09/07/24 0449 09/07/24 0756  BP: 139/73 134/70 (!) 140/82 119/71  Pulse: 99 91 96 90  Resp:    18  Temp: 100.1 F (37.8 C) 99.4 F (37.4 C) (!) 101.6 F (38.7 C) 100 F (37.8 C)  TempSrc: Oral Oral Oral Oral  SpO2:      Weight:      Height:        Awake Alert, No new F.N deficits, Normal affect [place].AT,PERRAL Supple Neck, No JVD,   Symmetrical Chest wall movement, Good air movement bilaterally, CTAB RRR,No Gallops, Rubs or new Murmurs,  +ve B.Sounds, Abd Soft, right upper quadrant tenderness, right sided abdominal drain in place No Cyanosis, Clubbing or edema   Subjective: Patient in bed, appears comfortable, denies any headache, no fever, no chest pain or pressure, no shortness of breath , no abdominal pain. No new focal weakness.  Family Communication: No one at bedside.  Disposition Plan: Status is: Inpatient Remains inpatient appropriate because: Currently on IV antibiotic.  Monitor for resolution of abscess with eventual drain removal.   Planned Discharge Destination: TBD Diet: Diet Order             DIET DYS 3 Room service appropriate? Yes; Fluid consistency: Thin  Diet effective now                    Data Review:   Patient Lines/Drains/Airways Status     Active Line/Drains/Airways     Name Placement date Placement time Site Days   Peripheral IV 09/06/24 22 G 1.75 Anterior;Left Forearm 09/06/24  1011  Forearm  1   Closed System Drain 1 RLQ Bulb (JP) 16 Fr. 09/05/24  1142  RLQ  2             Inpatient Medications  Scheduled Meds:  cyclobenzaprine   5 mg Oral TID   feeding supplement  1 Container Oral TID BM   pantoprazole  (PROTONIX ) IV  40 mg Intravenous Q12H    traZODone   50 mg Oral QHS   Continuous Infusions:  ampicillin -sulbactam (UNASYN ) IV 3 g (09/07/24 0549)   micafungin  (MYCAMINE ) 100 mg in sodium chloride  0.9 % 100 mL IVPB 100 mg (09/06/24 0851)   PRN Meds:.acetaminophen  **OR** acetaminophen , HYDROmorphone  (DILAUDID ) injection, LORazepam  **OR** LORazepam , oxyCODONE  **OR** oxyCODONE   DVT Prophylaxis  SCDs Start: 08/27/24 2352 Place TED hose Start: 08/27/24 2352  Recent Labs  Lab 09/02/24 0544 09/03/24 0542 09/04/24 0543 09/05/24 0226 09/07/24 0232  WBC 4.8 6.1 5.8 5.4 6.1  HGB 9.1* 9.4* 9.6* 8.8* 8.9*  HCT 28.3* 29.4* 30.4* 26.7* 27.3*  PLT 388 671* 667* 634* 561*  MCV 90.1 89.6 89.7 86.1 86.7  MCH 29.0 28.7 28.3 28.4 28.3  MCHC 32.2 32.0 31.6 33.0 32.6  RDW 15.1 14.8 14.7 14.6 14.6    Recent Labs  Lab 09/01/24 0238 09/02/24 0544 09/03/24 0542 09/04/24 0543 09/05/24 0226 09/06/24 0241 09/07/24 0232  NA 144 139 139 141 141 142 138  K 3.9 4.3 4.3 4.1 3.6 3.8 4.0  CL 108 108 106 106 105 105 103  CO2 26 21* 23 26 27 26 26   ANIONGAP 10 10 10 9 9 12 10   GLUCOSE 94 87 83 76 88 85 81  BUN 6*  5* <5* <5* <5* <5* <5*  CREATININE 0.67 0.62 0.68 0.73 0.75 0.73 0.84  AST 28  --   --   --   --   --   --   ALT 29  --   --   --   --   --   --   ALKPHOS 102  --   --   --   --   --   --   BILITOT 0.3  --   --   --   --   --   --   ALBUMIN 2.5*  --   --   --   --   --   --   MG 1.9 1.8 1.9 1.7 1.8  --   --   PHOS 2.6  --  2.8 3.0 3.0 2.9 2.8  CALCIUM  9.0 8.9 9.2 9.2 9.1 8.8* 8.8*      Recent Labs  Lab 09/01/24 0238 09/02/24 0544 09/03/24 0542 09/04/24 0543 09/05/24 0226 09/06/24 0241 09/07/24 0232  MG 1.9 1.8 1.9 1.7 1.8  --   --   CALCIUM  9.0 8.9 9.2 9.2 9.1 8.8* 8.8*    --------------------------------------------------------------------------------------------------------------- Lab Results  Component Value Date   CHOL 181 05/24/2024   HDL 68 05/24/2024   LDLCALC 98 05/24/2024   TRIG 73 08/29/2024   CHOLHDL  2.7 05/24/2024    Lab Results  Component Value Date   HGBA1C 5.7 (H) 07/14/2024   No results for input(s): TSH, T4TOTAL, FREET4, T3FREE, THYROIDAB in the last 72 hours. No results for input(s): VITAMINB12, FOLATE, FERRITIN, TIBC, IRON, RETICCTPCT in the last 72 hours. ------------------------------------------------------------------------------------------------------------------ Cardiac Enzymes No results for input(s): CKMB, TROPONINI, MYOGLOBIN in the last 168 hours.  Invalid input(s): CK  Micro Results Recent Results (from the past 240 hours)  Culture, blood (Routine X 2) w Reflex to ID Panel     Status: None   Collection Time: 08/28/24 12:41 PM   Specimen: BLOOD RIGHT HAND  Result Value Ref Range Status   Specimen Description BLOOD RIGHT HAND  Final   Special Requests   Final    BOTTLES DRAWN AEROBIC ONLY Blood Culture results may not be optimal due to an inadequate volume of blood received in culture bottles   Culture   Final    NO GROWTH 5 DAYS Performed at [facility], [address], [place], [place] [address]    Report Status 09/02/2024 FINAL  Final  Aerobic/Anaerobic Culture w Gram Stain (surgical/deep wound)     Status: None   Collection Time: 08/28/24  3:57 PM   Specimen: Abscess  Result Value Ref Range Status   Specimen Description ABSCESS  Final   Special Requests ABDOMEN  Final   Gram Stain   Final    MODERATE WBC PRESENT, PREDOMINANTLY PMN ABUNDANT GRAM POSITIVE COCCI MODERATE GRAM NEGATIVE RODS ABUNDANT GRAM POSITIVE RODS RARE YEAST    Culture   Final    RARE LACTOBACILLUS SPECIES Standardized susceptibility testing for this organism is not available. NO ANAEROBES ISOLATED Performed at [facility], [address] [place], [place] [address]    Report Status 09/02/2024 FINAL  Final  Culture, blood (Routine X 2) w Reflex to ID Panel     Status: None (Preliminary result)   Collection Time: 09/06/24  2:41 AM    Specimen: BLOOD LEFT HAND  Result Value Ref Range Status   Specimen Description BLOOD LEFT HAND  Final   Special Requests   Final  BOTTLES DRAWN AEROBIC AND ANAEROBIC Blood Culture adequate volume   Culture   Final    NO GROWTH 1 DAY Performed at [facility], [address], [place], [place] [address]    Report Status PENDING  Incomplete  Culture, blood (Routine X 2) w Reflex to ID Panel     Status: None (Preliminary result)   Collection Time: 09/06/24  2:47 AM   Specimen: BLOOD RIGHT HAND  Result Value Ref Range Status   Specimen Description BLOOD RIGHT HAND  Final   Special Requests   Final    BOTTLES DRAWN AEROBIC AND ANAEROBIC Blood Culture results may not be optimal due to an inadequate volume of blood received in culture bottles   Culture   Final    NO GROWTH 1 DAY Performed at [facility], [address], [place], [place] [address]    Report Status PENDING  Incomplete    Radiology Reports  DG Chest Port 1 View Result Date: 09/07/2024 EXAM: 1 VIEW(S) XRAY OF THE CHEST 09/07/2024 06:00:00 AM COMPARISON: 08/29/2024 CLINICAL HISTORY: Shortness of breath. FINDINGS: LUNGS AND PLEURA: Mild pulmonary edema. Small right pleural effusion. Bibasilar airspace opacities. No pneumothorax. HEART AND MEDIASTINUM: Cardiomegaly. Aortic arch atherosclerosis. BONES AND SOFT TISSUES: No acute osseous abnormality. IMPRESSION: 1. Mild pulmonary edema, small right pleural effusion, and bibasilar airspace opacities. 2. Cardiomegaly and aortic arch atherosclerosis. Electronically signed by: Evalene Coho MD 09/07/2024 06:31 AM EST RP Workstation: [id number]   DG Abd Portable 1V Result Date: 09/06/2024 EXAM: 1 VIEW XRAY OF THE ABDOMEN 09/06/2024 03:53:00 PM COMPARISON: 08/14/2024 CLINICAL HISTORY: Intractable nausea and vomiting. ICD10: 379885 Intractable nausea and vomiting. FINDINGS: LINES, TUBES AND DEVICES: Pigtail catheter in right mid abdomen. BOWEL: Paucity of bowel gas  throughout the abdomen. SOFT TISSUES: No abnormal calcifications. BONES: No acute fracture. LUNG BASES: Small right pleural effusion and patchy opacities at right lung base. IMPRESSION: 1. Paucity of bowel gas throughout the abdomen. 2. Small right pleural effusion and patchy opacities at the right lung base. Electronically signed by: Oneil Devonshire MD 09/06/2024 10:45 PM EST RP Workstation: [id number]   IR Catheter Tube Change Result Date: 09/05/2024 INDICATION: 64 year old with history of a perforated gastric ulcer and perihepatic abscess. Percutaneous drain was placed in the perihepatic abscess on 08/28/2024. Follow-up imaging demonstrates a large residual perihepatic collection. EXAM: 1. DRAIN INJECTION 2. DRAIN UPSIZE AND REPOSITIONING MEDICATIONS: Moderate sedation ANESTHESIA/SEDATION: Moderate (conscious) sedation was employed during this procedure. A total of Versed  2 mg and fentanyl  175 mcg was administered intravenously at the order of the provider performing the procedure. Total intra-service moderate sedation time: 30 minutes. Patient's level of consciousness and vital signs were monitored continuously by radiology nurse throughout the procedure under the supervision of the provider performing the procedure. COMPLICATIONS: None immediate. CONTRAST:  15 mL Omnipaque  300 FLUOROSCOPY TIME:  Radiation Exposure Index (as provided by the fluoroscopic device): 24 mGy Kerma PROCEDURE: Informed written consent was obtained from the patient after a thorough discussion of the procedural risks, benefits and alternatives. All questions were addressed. Maximal Sterile Barrier Technique was utilized including caps, mask, sterile gowns, sterile gloves, sterile drape, hand hygiene and skin antiseptic. A timeout was performed prior to the initiation of the procedure. The right upper abdomen and existing drain were prepped and draped in sterile fashion. Ultrasound demonstrated heterogeneous hyperechoic material in the  perihepatic collection. Scout image was obtained. Contrast was injected through the existing drain with fluoroscopic guidance. Contrast filled the large collection. Skin around  the drain was anesthetized with 1% lidocaine . The drain was cut and removed over a superstiff Amplatz wire. Kumpe catheter was advanced into the collection and repositioned in the medial and superior aspect of the collection. The tract was dilated to accommodate a 14 French multipurpose drain. Large amount of thick tan colored fluid with small pieces was aspirated. It was difficult to aspirate because the catheter was occluding with the small pieces. Rigorous irrigation was needed to remove the fluid. Due to the small pieces that were occluding the catheter, the drain was exchanged for a 16 French drain. Additional irrigation and aspiration was performed. Drain was positioned along the cephalad aspect of the collection. Drain was attached to a gravity bag. Drain was sutured in place and a bandage was placed. FINDINGS: Ultrasound demonstrated a large heterogeneous and hyperechoic collection around the liver. Contrast injection demonstrates a large perihepatic collection. Large amount of tan colored thick fluid was removed along with small pieces and that were intermittently occluding the drain. At the end of the procedure, the drain was positioned more cephalad within the collection. Approximately 180 mL of tan colored fluid was removed. IMPRESSION: Perihepatic abscess drain was up sized and repositioned using fluoroscopic guidance. 180 mL of thick fluid was removed. Electronically Signed   By: Juliene Balder M.D.   On: 09/05/2024 13:27      Signature  -   Lavada Stank M.D on 09/07/2024 at 9:18 AM   -  To page go to [email or web address]

## 2024-09-07 NOTE — Progress Notes (Signed)
 Nutrition Follow-up  DOCUMENTATION CODES:   Obesity unspecified  INTERVENTION:   -Once diet is advanced, resume:   Boost Breeze po TID, each supplement provides 250 kcal and 9 grams of protein  -Magic cup TID with meals, each supplement provides 290 kcal and 9 grams of protein   -Will follow for CT results to determine nutrition plan  NUTRITION DIAGNOSIS:   Inadequate oral intake related to altered GI function as evidenced by NPO status.  Ongoing  GOAL:   Patient will meet greater than or equal to 90% of their needs  Progressing   MONITOR:   PO intake, Supplement acceptance, Diet advancement  REASON FOR ASSESSMENT:   Consult Assessment of nutrition requirement/status  ASSESSMENT:   64 y.o. female with medical history significant of history of GI bleed (recent admission 1/3 for melena-EGD showed esophageal plaques suspicious for candidiasis nonbleeding gastric ulcer and gastritis-patient left AMA before completion of the treatment), essential hypertension, polysubstance abuse disorder, tobacco use disorder, thrombocytopenia, essential hypertension, physical deconditioning/debility and weakness, and morbid obesity presented complaining of black-colored stool that started 1/17/26and patient also has generalized weakness.  1/18- s/p CT guided right perihepatic abscess drain placement  1/22- s/p UGI- no leak (suspicious for achalasia), advanced to clear liquids 1/23- advanced to full liquid diet 1/24- advanced to soft diet 1/25- CT revealed persistent large perihepatic abscess  1/26- s/p Drain injection with drain manipulation and up size (from 14 to 16 french-180 ml aspirated)  1/27- CT reveals persistent large perihepatic collection, does not communicate with stomach  Reviewed I/O's: -50 ml x 24 hours and +7.2 L since admission  Drain output: 50 ml x 24 hours  Patient unavailable at time of visit. Attempted to speak with patient via call to hospital room phone,  however, unable to reach. RD unable to obtain further nutrition-related history or complete nutrition-focused physical exam at this time.    Per general surgery notes, patient had temp of 101.6 F overnight. PA noted broccoli florets in drain. Plan repeat CT scan and NPO today.   GI has signed off and plan to repeat EGD in 3 months to assess healing of ulcer.   Prior to today, patient was on a dysphagia 3 diet diet and eating well. Noted meal completions 100%. Patient also liking Boost Breeze supplements.   Medications reviewed and include protonix .   Labs reviewed.   Diet Order:   Diet Order             Diet NPO time specified  Diet effective now                   EDUCATION NEEDS:   Not appropriate for education at this time  Skin:  Skin Assessment: Reviewed RN Assessment  Last BM:  09/04/24  Height:   Ht Readings from Last 1 Encounters:  08/27/24 5' 4 (1.626 m)    Weight:   Wt Readings from Last 1 Encounters:  08/27/24 81.6 kg    Ideal Body Weight:  54.5 kg  BMI:  Body mass index is 30.9 kg/m.  Estimated Nutritional Needs:   Kcal:  1700-1900  Protein:  90-105 grams  Fluid:  1.7-1.9 L    Margery ORN, RD, LDN, CDCES Registered Dietitian III Certified Diabetes Care and Education Specialist If unable to reach this RD, please use RD Inpatient group chat on secure chat between hours of 8am-4 pm daily

## 2024-09-08 ENCOUNTER — Other Ambulatory Visit: Payer: Self-pay

## 2024-09-08 DIAGNOSIS — K922 Gastrointestinal hemorrhage, unspecified: Secondary | ICD-10-CM | POA: Diagnosis not present

## 2024-09-08 LAB — COMPREHENSIVE METABOLIC PANEL WITH GFR
ALT: 13 U/L (ref 0–44)
AST: 17 U/L (ref 15–41)
Albumin: 2.5 g/dL — ABNORMAL LOW (ref 3.5–5.0)
Alkaline Phosphatase: 83 U/L (ref 38–126)
Anion gap: 11 (ref 5–15)
BUN: 6 mg/dL — ABNORMAL LOW (ref 8–23)
CO2: 24 mmol/L (ref 22–32)
Calcium: 8.7 mg/dL — ABNORMAL LOW (ref 8.9–10.3)
Chloride: 103 mmol/L (ref 98–111)
Creatinine, Ser: 0.84 mg/dL (ref 0.44–1.00)
GFR, Estimated: >60 mL/min
Glucose, Bld: 78 mg/dL (ref 70–99)
Potassium: 3.9 mmol/L (ref 3.5–5.1)
Sodium: 138 mmol/L (ref 135–145)
Total Bilirubin: 0.4 mg/dL (ref 0.0–1.2)
Total Protein: 6.7 g/dL (ref 6.5–8.1)

## 2024-09-08 LAB — PHOSPHORUS: Phosphorus: 2.8 mg/dL (ref 2.5–4.6)

## 2024-09-08 LAB — MAGNESIUM: Magnesium: 1.8 mg/dL (ref 1.7–2.4)

## 2024-09-08 MED ORDER — FENTANYL CITRATE (PF) 50 MCG/ML IJ SOSY: 25.0000 ug | PREFILLED_SYRINGE | Freq: Once | INTRAMUSCULAR | Status: DC

## 2024-09-08 MED ORDER — INSULIN ASPART 100 UNIT/ML IJ SOLN: 0.0000 [IU] | Freq: Four times a day (QID) | INTRAMUSCULAR | Status: DC

## 2024-09-08 MED ORDER — ACETAMINOPHEN 10 MG/ML IV SOLN
1000.0000 mg | Freq: Four times a day (QID) | INTRAVENOUS | Status: AC | PRN
  Administered 2024-09-08 – 2024-09-09 (×2): 1000 mg via INTRAVENOUS
  Filled 2024-09-08 (×2): qty 100

## 2024-09-08 MED ORDER — SODIUM CHLORIDE 0.9% FLUSH
10.0000 mL | Freq: Two times a day (BID) | INTRAVENOUS | Status: AC
  Administered 2024-09-09 – 2024-09-15 (×11): 10 mL
  Administered 2024-09-16 (×2): 20 mL

## 2024-09-08 MED ORDER — SODIUM CHLORIDE 0.9% FLUSH: 10.0000 mL | INTRAVENOUS | Status: AC | PRN

## 2024-09-08 MED ORDER — MAGNESIUM SULFATE 2 GM/50ML IV SOLN
2.0000 g | Freq: Once | INTRAVENOUS | Status: AC
  Administered 2024-09-08: 2 g via INTRAVENOUS
  Filled 2024-09-08: qty 50

## 2024-09-08 MED ORDER — FENTANYL CITRATE (PF) 50 MCG/ML IJ SOSY
25.0000 ug | PREFILLED_SYRINGE | INTRAMUSCULAR | Status: AC
  Administered 2024-09-08: 25 ug via INTRAVENOUS
  Filled 2024-09-08: qty 1

## 2024-09-08 MED ORDER — TRAVASOL 10 % IV SOLN
INTRAVENOUS | Status: DC
  Filled 2024-09-08: qty 626.4

## 2024-09-08 NOTE — Progress Notes (Signed)
 Nutrition Follow-up  DOCUMENTATION CODES:   Obesity unspecified  INTERVENTION:   -Continue clear liquid diet -Continue Boost Breeze po TID, each supplement provides 250 kcal and 9 grams of protein  -RD will follow for diet advancement and adjust supplement regimen as appropriate -TPN management pert pharmacy   NUTRITION DIAGNOSIS:   Inadequate oral intake related to altered GI function as evidenced by NPO status.  Ongoing   GOAL:   Patient will meet greater than or equal to 90% of their needs  Progressing   MONITOR:   PO intake, Supplement acceptance, Diet advancement  REASON FOR ASSESSMENT:   Consult Assessment of nutrition requirement/status  ASSESSMENT:   64 y.o. female with medical history significant of history of GI bleed (recent admission 1/3 for melena-EGD showed esophageal plaques suspicious for candidiasis nonbleeding gastric ulcer and gastritis-patient left AMA before completion of the treatment), essential hypertension, polysubstance abuse disorder, tobacco use disorder, thrombocytopenia, essential hypertension, physical deconditioning/debility and weakness, and morbid obesity presented complaining of black-colored stool that started 1/17/26and patient also has generalized weakness.  1/18- s/p CT guided right perihepatic abscess drain placement  1/22- s/p UGI- no leak (suspicious for achalasia), advanced to clear liquids 1/23- advanced to full liquid diet 1/24- advanced to soft diet 1/25- CT revealed persistent large perihepatic abscess  1/26- s/p Drain injection with drain manipulation and up size (from 14 to 16 french-180 ml aspirated)  1/27- CT reveals persistent large perihepatic collection, does not communicate with stomach 1/29- CT reveals suspicion of gastric perforation contained within this abscess cavity, plan for PICC and TPN, advanced to clear liquids  Reviewed I/O's: +7.2 L x 24 hours  Patient unavailable at time of visit. Attempted to speak  with patient via call to hospital room phone, however, unable to reach. RD unable to obtain further nutrition-related history or complete nutrition-focused physical exam at this time.    CT completed yesterday due to concern of enteral contents in the drain. Per general surgery notes, CT revealed suspicion of gastric perforation contained within this abscess cavity. Patient will likely require distal gastrectomy and evacuation of abscess next week.   Patient previously on a dysphagia 3 diet diet and eating well. Noted meal completions 100%. She is drinking Boost Breeze supplements.   Case discussed with pharmacy, who confirmed plan for PICC and TPN today. Patient not at high refeeding risk due to previous good oral intake. She does not meet criteria for malnutrition at this time. Plan to initiate TPN at 45 ml/hr tonight, which provides 1052 kcals and 63 grams protein, which provides 62% of estimated kcal needs and 70% of estimated protein needs.   No new weight since last visit.   Medications reviewed and include protonix .   Labs reviewed: K, Mg, and Phos WDL.    Diet Order:   Diet Order             Diet clear liquid Fluid consistency: Thin  Diet effective now                   EDUCATION NEEDS:   Not appropriate for education at this time  Skin:  Skin Assessment: Reviewed RN Assessment  Last BM:  09/04/24  Height:   Ht Readings from Last 1 Encounters:  08/27/24 5' 4 (1.626 m)    Weight:   Wt Readings from Last 1 Encounters:  08/27/24 81.6 kg    Ideal Body Weight:  54.5 kg  BMI:  Body mass index is 30.9 kg/m.  Estimated Nutritional  Needs:   Kcal:  1700-1900  Protein:  90-105 grams  Fluid:  1.7-1.9 L    Margery ORN, RD, LDN, CDCES Registered Dietitian III Certified Diabetes Care and Education Specialist If unable to reach this RD, please use RD Inpatient group chat on secure chat between hours of 8am-4 pm daily

## 2024-09-08 NOTE — Progress Notes (Signed)
 [facility] Progress Note Patient: Jennifer [name] FMW:[id number] DOB: October 26, 1960  DOA: 08/27/2024 DOS: the patient was seen and examined on 09/08/2024  Brief Summary: Patient is a 64 y.o.  female with history of HTN, EtOH use, substance abuse (prior UDS positive for opiates/cocaine/benzos)-who recently had a admission for melena-EGD showed gastric ulcer-patient subsequently left AMA-presented with generalized weakness/melanotic stools/upper abdominal pain-she was found to have right hepatic abscess with fistulous communication to a contained gastric perforation.   Significant events: 1/17>> admit to [facility] 1/22>> underwent upper GI series.  Diet advanced. 1/25>> repeat CT abdomen pelvis with contrast shows persistent abscess. 1/26>> underwent upsizing of her drain with IR.  Developed fever after that.  Blood cultures ordered.  Remains on antibiotics.   Significant studies: 1/17>> CT abdomen/pelvis: Large right subhepatic subcapsular abscess with fistulous communication to the gastric antrum.  New moderate-sized right pleural effusion.  1/22>> upper GI series esophageal distention with severe esophageal dysmotility   Significant microbiology data: 1/18>> blood culture: No growth 1/18>> pleural fluid culture: No growth 1/18>> right hepatic abscess culture: Lactobacillus/rare yeast   Procedures: 1/18>> CT-guided right perihepatic abscess drain placement 1/18>> thoracocentesis by IR 1/29.  Repeat CT scan. 1. Persistent 6.7 x 10.1 cm subcapsular fluid collection in the right liver with air-fluid level, layering density, and a pigtail drainage catheter. No significant decrease in size since previous study. Presence of contrast material in the cavity could indicate communication with the bowel or recent contrast injection of the tube 1/29.  PICC line and TNA requested by general surgery.   Consults: [facility] IR General surgery ID   Assessment and Plan:  Right hepatic abscess secondary  to contained gastric perforation with fistulous communication Contained gastric perforation with fistulous communication with right hepatic abscess S/p CT-guided drain placement by IR on 1/18, upsized on 09/05/2024. Currently on Unasyn  and micafungin  combination Upper GI series shows esophageal distention concerning for achalasia and no evidence of leakage of contrast.  Tolerating soft diet ID, general surgery and IR following, case discussed with ID as well as general surgery again on 09/07/2024.  CT abdomen pelvis with contrast shows persistent abscess.  Her drain was upsized by IR on 09/05/2024. Still has low-grade fevers IR and general surgery following, follow repeat cultures defer management to ID, IR and general surgery. Not recommended for home IV antibiotics.  Not a good candidate for long-term drain management at home as well. Repeat CT scan noted on 09/08/2024 discussed with general surgery in detail, plan is to place a PICC line, TNA optimize nutrition and then operate early next week.  Recent upper GI bleed (EGD on 1/3 showed candidiasis and gastric ulcer) Completed a course of fluconazole  On PPI twice daily-6-8 weeks   Right-sided pleural effusion S/p thoracocentesis on 1/18 Exudative by lights criteria-however cultures negative This is felt to be sympathetic effusion to hepatic abscess On empiric antibiotics as above   HTN BP stable-not on any antihypertensives   EtOH use Apparently has quit drinking-no withdrawal symptoms   History of substance abuse (UDS positive for benzo/opiates/cocaine on 1/4) Will counsel the patient prior to discharge   Noncompliance Continues to have poor insight in her medical condition. She has started to leave AMA multiple times. Multiple providers have counseled her again and again regarding the importance of current management. Multiple providers including myself have advised her of the life-threatening and life disabling risk of leaving  AGAINST MEDICAL ADVICE.   Class 1 Obesity: Body mass index is 30.9 kg/m.  Placing the  patient at high risk for him.   Normocytic acute on chronic blood loss anemia. Hemoglobin appears to be stable around 9.  Acute pain control. Patient reports severe pain in her abdomen. Currently able to tolerate oral soft diet but at the same time reports severe pain in her drain area. Continue current regimen of oxycodone  5 mg and 10 mg with Dilaudid  0.5 mg for refractory pain and muscle relaxer.   DVT Prophylaxis: SCDs Start: 08/27/24 2352 Place TED hose Start: 08/27/24 2352   Data review I have Reviewed nursing notes, Vitals, and Lab results. Since last encounter, pertinent lab results  BMP   . I have ordered test including CBC and BMP  . I have ordered imaging x-ray abdomen  .   Physical exam.  Vitals:   09/07/24 0756 09/07/24 1138 09/07/24 2120 09/07/24 2338  BP: 119/71 125/83 (!) 127/116 131/71  Pulse: 90 (!) 59 99 90  Resp: 18 17    Temp: 100 F (37.8 C) (!) 100.8 F (38.2 C) (!) 102.8 F (39.3 C) (!) 101.2 F (38.4 C)  TempSrc: Oral Oral Oral Oral  SpO2:      Weight:      Height:        Awake Alert, No new F.N deficits, Normal affect [place].AT,PERRAL Supple Neck, No JVD,   Symmetrical Chest wall movement, Good air movement bilaterally, CTAB RRR,No Gallops, Rubs or new Murmurs,  +ve B.Sounds, Abd Soft, right upper quadrant tenderness, right sided abdominal drain in place No Cyanosis, Clubbing or edema   Subjective: Patient in bed, appears comfortable, denies any headache, no fever, no chest pain or pressure, no shortness of breath , no abdominal pain. No new focal weakness.  Family Communication: No one at bedside.  Disposition Plan: Status is: Inpatient Remains inpatient appropriate because: Currently on IV antibiotic.  Monitor for resolution of abscess with eventual drain removal.   Planned Discharge Destination: TBD Diet: Diet Order             Diet NPO time  specified Except for: Ice Chips  Diet effective now                    Data Review:   Patient Lines/Drains/Airways Status     Active Line/Drains/Airways     Name Placement date Placement time Site Days   Midline Single Lumen 09/08/24 Right Cephalic 8 cm 0 cm 09/08/24  0637  Cephalic  less than 1   Closed System Drain 1 RLQ Bulb (JP) 16 Fr. 09/05/24  1142  RLQ  3             Inpatient Medications  Scheduled Meds:  [START ON 09/09/2024] insulin  aspart  0-9 Units Subcutaneous Q6H   pantoprazole  (PROTONIX ) IV  40 mg Intravenous Q12H   sodium chloride  flush  10-40 mL Intracatheter Q12H   Continuous Infusions:  acetaminophen      ampicillin -sulbactam (UNASYN ) IV 3 g (09/08/24 9356)   magnesium  sulfate bolus IVPB 2 g (09/08/24 1035)   micafungin  (MYCAMINE ) 100 mg in sodium chloride  0.9 % 100 mL IVPB 100 mg (09/08/24 0941)   TPN ADULT (ION)     PRN Meds:.acetaminophen , [DISCONTINUED] acetaminophen  **OR** acetaminophen , HYDROmorphone  (DILAUDID ) injection, LORazepam  **OR** LORazepam , sodium chloride  flush  DVT Prophylaxis  SCDs Start: 08/27/24 2352 Place TED hose Start: 08/27/24 2352  Recent Labs  Lab 09/02/24 0544 09/03/24 0542 09/04/24 0543 09/05/24 0226 09/07/24 0232  WBC 4.8 6.1 5.8 5.4 6.1  HGB 9.1* 9.4* 9.6* 8.8* 8.9*  HCT 28.3* 29.4* 30.4* 26.7* 27.3*  PLT 388 671* 667* 634* 561*  MCV 90.1 89.6 89.7 86.1 86.7  MCH 29.0 28.7 28.3 28.4 28.3  MCHC 32.2 32.0 31.6 33.0 32.6  RDW 15.1 14.8 14.7 14.6 14.6    Recent Labs  Lab 09/02/24 0544 09/02/24 0544 09/03/24 0542 09/04/24 0543 09/05/24 0226 09/06/24 0241 09/07/24 0232 09/08/24 0242  NA 139  --  139 141 141 142 138 138  K 4.3  --  4.3 4.1 3.6 3.8 4.0 3.9  CL 108  --  106 106 105 105 103 103  CO2 21*  --  23 26 27 26 26 24   ANIONGAP 10  --  10 9 9 12 10 11   GLUCOSE 87  --  83 76 88 85 81 78  BUN 5*  --  <5* <5* <5* <5* <5* 6*  CREATININE 0.62  --  0.68 0.73 0.75 0.73 0.84 0.84  AST  --   --   --    --   --   --   --  17  ALT  --   --   --   --   --   --   --  13  ALKPHOS  --   --   --   --   --   --   --  83  BILITOT  --   --   --   --   --   --   --  0.4  ALBUMIN  --   --   --   --   --   --   --  2.5*  MG 1.8  --  1.9 1.7 1.8  --   --  1.8  PHOS  --    < > 2.8 3.0 3.0 2.9 2.8 2.8  CALCIUM  8.9  --  9.2 9.2 9.1 8.8* 8.8* 8.7*   < > = values in this interval not displayed.      Recent Labs  Lab 09/02/24 0544 09/03/24 0542 09/04/24 0543 09/05/24 0226 09/06/24 0241 09/07/24 0232 09/08/24 0242  MG 1.8 1.9 1.7 1.8  --   --  1.8  CALCIUM  8.9 9.2 9.2 9.1 8.8* 8.8* 8.7*    --------------------------------------------------------------------------------------------------------------- Lab Results  Component Value Date   CHOL 181 05/24/2024   HDL 68 05/24/2024   LDLCALC 98 05/24/2024   TRIG 73 08/29/2024   CHOLHDL 2.7 05/24/2024    Lab Results  Component Value Date   HGBA1C 5.7 (H) 07/14/2024   No results for input(s): TSH, T4TOTAL, FREET4, T3FREE, THYROIDAB in the last 72 hours. No results for input(s): VITAMINB12, FOLATE, FERRITIN, TIBC, IRON, RETICCTPCT in the last 72 hours. ------------------------------------------------------------------------------------------------------------------ Cardiac Enzymes No results for input(s): CKMB, TROPONINI, MYOGLOBIN in the last 168 hours.  Invalid input(s): CK  Micro Results Recent Results (from the past 240 hours)  Culture, blood (Routine X 2) w Reflex to ID Panel     Status: None (Preliminary result)   Collection Time: 09/06/24  2:41 AM   Specimen: BLOOD LEFT HAND  Result Value Ref Range Status   Specimen Description BLOOD LEFT HAND  Final   Special Requests   Final    BOTTLES DRAWN AEROBIC AND ANAEROBIC Blood Culture adequate volume   Culture   Final    NO GROWTH 2 DAYS Performed at [facility], [address], [place], [place] [address]    Report Status PENDING  Incomplete   Culture, blood (Routine X 2) w Reflex  to ID Panel     Status: None (Preliminary result)   Collection Time: 09/06/24  2:47 AM   Specimen: BLOOD RIGHT HAND  Result Value Ref Range Status   Specimen Description BLOOD RIGHT HAND  Final   Special Requests   Final    BOTTLES DRAWN AEROBIC AND ANAEROBIC Blood Culture results may not be optimal due to an inadequate volume of blood received in culture bottles   Culture   Final    NO GROWTH 2 DAYS Performed at [facility], [address], [place], [place] [address]    Report Status PENDING  Incomplete    Radiology Reports  US  EKG SITE RITE Result Date: 09/08/2024 If Site Rite image not attached, placement could not be confirmed due to current cardiac rhythm.  CT ABDOMEN PELVIS W CONTRAST Result Date: 09/07/2024 EXAM: CT ABDOMEN AND PELVIS WITH CONTRAST 09/07/2024 04:06:06 PM TECHNIQUE: CT of the abdomen and pelvis was performed with the administration of 75 mL of iohexol  (OMNIPAQUE ) 350 MG/ML injection. Multiplanar reformatted images are provided for review. Automated exposure control, iterative reconstruction, and/or weight-based adjustment of the mA/kV was utilized to reduce the radiation dose to as low as reasonably achievable. COMPARISON: Abdominal radiograph 09/06/2024 and CT abdomen and pelvis 09/04/2024. CLINICAL HISTORY: Gastric perforation, evaluate for extravasation. FINDINGS: LOWER CHEST: Medium-sized right pleural effusion with atelectasis or consolidation in the right lung base. Less prominent atelectasis in the left lung base. Cardiac enlargement. LIVER: Persistent finding of a 6.7 x 10.1 cm subcapsular fluid collection in the right liver. The collection contains an air-fluid level and layering density, probably contrast material. A pigtail drainage catheter is present in the cavity. Presence of contrast material in the cavity could indicate communication with the bowel, as there is contrast material in the colon or there could have  been contrast injection of the tube recently. No significant decrease in size of collection since previous study. GALLBLADDER AND BILE DUCTS: Gallbladder is unremarkable. No biliary ductal dilatation. SPLEEN: No acute abnormality. PANCREAS: No acute abnormality. ADRENAL GLANDS: No acute abnormality. KIDNEYS, URETERS AND BLADDER: Right lower pole renal stone measuring 4 mm diameter. No hydronephrosis or hydroureter. No perinephric or periureteral stranding. Urinary bladder is unremarkable. GI AND BOWEL: Lower esophagus is dilated and filled with ingested material, possibly achalasia, obstruction, or other dysmotility. Similar appearance to previous study. The stomach, small bowel, and colon are mostly decompressed, with scattered stool in the colon. Contrast material is demonstrated in the distal small bowel and throughout the colon. The appendix is normal. There is no bowel obstruction. PERITONEUM AND RETROPERITONEUM: Small amount of free fluid in the right abdomen and pelvis. No free air. VASCULATURE: Aorta is normal in caliber. LYMPH NODES: No lymphadenopathy. REPRODUCTIVE ORGANS: Uterus is surgically absent. No abnormal adnexal masses. BONES AND SOFT TISSUES: Degenerative changes in the spine. No acute osseous abnormality. Soft tissue edema in the subcutaneous fat. No focal soft tissue abnormality. IMPRESSION: 1. Persistent 6.7 x 10.1 cm subcapsular fluid collection in the right liver with air-fluid level, layering density, and a pigtail drainage catheter. No significant decrease in size since previous study. Presence of contrast material in the cavity could indicate communication with the bowel or recent contrast injection of the tube. 2. No free air and no definite intraperitoneal contrast extravasation. 3. Medium-sized right pleural effusion with atelectasis or consolidation in the right lung base, with less prominent atelectasis in the left lung base. Electronically signed by: Elsie Gravely MD 09/07/2024  04:47 PM EST RP Workstation:  [id number]   DG Chest Port 1 View Result Date: 09/07/2024 EXAM: 1 VIEW(S) XRAY OF THE CHEST 09/07/2024 06:00:00 AM COMPARISON: 08/29/2024 CLINICAL HISTORY: Shortness of breath. FINDINGS: LUNGS AND PLEURA: Mild pulmonary edema. Small right pleural effusion. Bibasilar airspace opacities. No pneumothorax. HEART AND MEDIASTINUM: Cardiomegaly. Aortic arch atherosclerosis. BONES AND SOFT TISSUES: No acute osseous abnormality. IMPRESSION: 1. Mild pulmonary edema, small right pleural effusion, and bibasilar airspace opacities. 2. Cardiomegaly and aortic arch atherosclerosis. Electronically signed by: Evalene Coho MD 09/07/2024 06:31 AM EST RP Workstation: [id number]   DG Abd Portable 1V Result Date: 09/06/2024 EXAM: 1 VIEW XRAY OF THE ABDOMEN 09/06/2024 03:53:00 PM COMPARISON: 08/14/2024 CLINICAL HISTORY: Intractable nausea and vomiting. ICD10: [id number] Intractable nausea and vomiting. FINDINGS: LINES, TUBES AND DEVICES: Pigtail catheter in right mid abdomen. BOWEL: Paucity of bowel gas throughout the abdomen. SOFT TISSUES: No abnormal calcifications. BONES: No acute fracture. LUNG BASES: Small right pleural effusion and patchy opacities at right lung base. IMPRESSION: 1. Paucity of bowel gas throughout the abdomen. 2. Small right pleural effusion and patchy opacities at the right lung base. Electronically signed by: Oneil Devonshire MD 09/06/2024 10:45 PM EST RP Workstation: [id number]      Signature  -   Lavada Stank M.D on 09/08/2024 at 11:14 AM   -  To page go to [email or web address]

## 2024-09-08 NOTE — Plan of Care (Signed)
" °  Problem: Education: Goal: Knowledge of General Education information will improve Description: Including pain rating scale, medication(s)/side effects and non-pharmacologic comfort measures Outcome: Progressing   Problem: Clinical Measurements: Goal: Respiratory complications will improve Outcome: Progressing   Problem: Coping: Goal: Level of anxiety will decrease Outcome: Not Progressing   Problem: Pain Managment: Goal: General experience of comfort will improve and/or be controlled Outcome: Not Progressing   "

## 2024-09-08 NOTE — Progress Notes (Signed)
 "   Referring Physician(s): Dasie Best    Supervising Physician: Jennefer Rover  Patient Status:  [facility] - In-pt  Chief Complaint:  Gastric antral ulcer, perihepatic abscess s/p 10 fr drain placement by Dr. Jennefer, s/p upsize to 16 Fr by Dr. Philip on 1/26   Subjective:  Patient laying in bed, NAD.  Does not engage conversation much.   Allergies: Patient has no known allergies.  Medications: Prior to Admission medications  Medication Sig Start Date End Date Taking? Authorizing Provider  [Paused] amLODipine  (NORVASC ) 10 MG tablet TAKE 1 TABLET (10 MG TOTAL) BY MOUTH DAILY (Office visit for future refills) Wait to take this until your doctor or other care provider tells you to start again. 07/14/24   Newlin, Enobong, MD  atorvastatin  (LIPITOR) 10 MG tablet Take 1 tablet (10 mg total) by mouth daily. Patient not taking: Reported on 08/28/2024 07/14/24   Newlin, Enobong, MD  Blood Pressure Monitoring (BLOOD PRESSURE CUFF) MISC 1 Units by Does not apply route daily. 05/24/24   Mayers, Cari S, PA-C  dicyclomine  (BENTYL ) 20 MG tablet Take 1 tablet (20 mg total) by mouth 3 (three) times daily as needed for spasms. Patient not taking: Reported on 08/28/2024 08/22/24 09/21/24  Christobal Guadalajara, MD  folic acid  (FOLVITE ) 1 MG tablet Take 1 tablet (1 mg total) by mouth daily. Patient not taking: Reported on 08/28/2024 08/22/24 09/21/24  Christobal Guadalajara, MD  [Paused] lisinopril -hydrochlorothiazide  (ZESTORETIC ) 20-12.5 MG tablet Take 2 tablets by mouth daily. Wait to take this until your doctor or other care provider tells you to start again. 07/14/24   Newlin, Enobong, MD  metoprolol  succinate (TOPROL -XL) 50 MG 24 hr tablet Take 1 tablet (50 mg total) by mouth daily. Patient not taking: Reported on 08/28/2024 07/14/24   Newlin, Enobong, MD  oxyCODONE -acetaminophen  (PERCOCET/ROXICET) 5-325 MG tablet Take 1 tablet by mouth every 6 (six) hours as needed for moderate pain (pain score 4-6). Patient not taking: Reported on  08/28/2024 08/22/24   Christobal Guadalajara, MD  pantoprazole  (PROTONIX ) 40 MG tablet Take 1 tablet (40 mg total) by mouth 2 (two) times daily. Patient not taking: Reported on 08/28/2024 08/22/24 09/21/24  Christobal Guadalajara, MD  thiamine  (VITAMIN B-1) 100 MG tablet Take 1 tablet (100 mg total) by mouth daily. Patient not taking: Reported on 08/28/2024 08/22/24 09/21/24  Christobal Guadalajara, MD     Vital Signs: BP 131/71 (BP Location: Left Arm)   Pulse 90   Temp (!) 101.2 F (38.4 C) (Oral)   Resp 17   Ht 5' 4 (1.626 m)   Wt 180 lb (81.6 kg)   LMP 12/04/2009   SpO2 100%   BMI 30.90 kg/m   Physical Exam Vitals reviewed.  Constitutional:      General: She is not in acute distress.    Appearance: She is not ill-appearing.  Pulmonary:     Effort: Pulmonary effort is normal.  Abdominal:     Comments: Positive RUQ drain to a gravity bag. Site with small purulent leakage around the insertion site. The site is otherwise unremarkable with no erythema, edema, tenderness, bleeding. Suture  in place. Dressing is clean, dry, and intact. ~10 ml of  tan colored, purulent  fluid noted in the bag. Drain aspirates and flushes well.    Musculoskeletal:     Cervical back: Neck supple.  Skin:    General: Skin is warm and dry.     Coloration: Skin is not jaundiced.  Neurological:     Mental Status: She is  alert.  Psychiatric:        Mood and Affect: Mood normal.        Behavior: Behavior normal.     Imaging: US  EKG SITE RITE Result Date: 09/08/2024 If Site Rite image not attached, placement could not be confirmed due to current cardiac rhythm.  CT ABDOMEN PELVIS W CONTRAST Result Date: 09/07/2024 EXAM: CT ABDOMEN AND PELVIS WITH CONTRAST 09/07/2024 04:06:06 PM TECHNIQUE: CT of the abdomen and pelvis was performed with the administration of 75 mL of iohexol  (OMNIPAQUE ) 350 MG/ML injection. Multiplanar reformatted images are provided for review. Automated exposure control, iterative reconstruction, and/or weight-based  adjustment of the mA/kV was utilized to reduce the radiation dose to as low as reasonably achievable. COMPARISON: Abdominal radiograph 09/06/2024 and CT abdomen and pelvis 09/04/2024. CLINICAL HISTORY: Gastric perforation, evaluate for extravasation. FINDINGS: LOWER CHEST: Medium-sized right pleural effusion with atelectasis or consolidation in the right lung base. Less prominent atelectasis in the left lung base. Cardiac enlargement. LIVER: Persistent finding of a 6.7 x 10.1 cm subcapsular fluid collection in the right liver. The collection contains an air-fluid level and layering density, probably contrast material. A pigtail drainage catheter is present in the cavity. Presence of contrast material in the cavity could indicate communication with the bowel, as there is contrast material in the colon or there could have been contrast injection of the tube recently. No significant decrease in size of collection since previous study. GALLBLADDER AND BILE DUCTS: Gallbladder is unremarkable. No biliary ductal dilatation. SPLEEN: No acute abnormality. PANCREAS: No acute abnormality. ADRENAL GLANDS: No acute abnormality. KIDNEYS, URETERS AND BLADDER: Right lower pole renal stone measuring 4 mm diameter. No hydronephrosis or hydroureter. No perinephric or periureteral stranding. Urinary bladder is unremarkable. GI AND BOWEL: Lower esophagus is dilated and filled with ingested material, possibly achalasia, obstruction, or other dysmotility. Similar appearance to previous study. The stomach, small bowel, and colon are mostly decompressed, with scattered stool in the colon. Contrast material is demonstrated in the distal small bowel and throughout the colon. The appendix is normal. There is no bowel obstruction. PERITONEUM AND RETROPERITONEUM: Small amount of free fluid in the right abdomen and pelvis. No free air. VASCULATURE: Aorta is normal in caliber. LYMPH NODES: No lymphadenopathy. REPRODUCTIVE ORGANS: Uterus is  surgically absent. No abnormal adnexal masses. BONES AND SOFT TISSUES: Degenerative changes in the spine. No acute osseous abnormality. Soft tissue edema in the subcutaneous fat. No focal soft tissue abnormality. IMPRESSION: 1. Persistent 6.7 x 10.1 cm subcapsular fluid collection in the right liver with air-fluid level, layering density, and a pigtail drainage catheter. No significant decrease in size since previous study. Presence of contrast material in the cavity could indicate communication with the bowel or recent contrast injection of the tube. 2. No free air and no definite intraperitoneal contrast extravasation. 3. Medium-sized right pleural effusion with atelectasis or consolidation in the right lung base, with less prominent atelectasis in the left lung base. Electronically signed by: Elsie Gravely MD 09/07/2024 04:47 PM EST RP Workstation: [id number]   DG Chest Port 1 View Result Date: 09/07/2024 EXAM: 1 VIEW(S) XRAY OF THE CHEST 09/07/2024 06:00:00 AM COMPARISON: 08/29/2024 CLINICAL HISTORY: Shortness of breath. FINDINGS: LUNGS AND PLEURA: Mild pulmonary edema. Small right pleural effusion. Bibasilar airspace opacities. No pneumothorax. HEART AND MEDIASTINUM: Cardiomegaly. Aortic arch atherosclerosis. BONES AND SOFT TISSUES: No acute osseous abnormality. IMPRESSION: 1. Mild pulmonary edema, small right pleural effusion, and bibasilar airspace opacities. 2. Cardiomegaly and aortic arch atherosclerosis. Electronically signed by: Evalene Coho  MD 09/07/2024 06:31 AM EST RP Workstation: [id number]   DG Abd Portable 1V Result Date: 09/06/2024 EXAM: 1 VIEW XRAY OF THE ABDOMEN 09/06/2024 03:53:00 PM COMPARISON: 08/14/2024 CLINICAL HISTORY: Intractable nausea and vomiting. ICD10: 379885 Intractable nausea and vomiting. FINDINGS: LINES, TUBES AND DEVICES: Pigtail catheter in right mid abdomen. BOWEL: Paucity of bowel gas throughout the abdomen. SOFT TISSUES: No abnormal calcifications. BONES: No acute  fracture. LUNG BASES: Small right pleural effusion and patchy opacities at right lung base. IMPRESSION: 1. Paucity of bowel gas throughout the abdomen. 2. Small right pleural effusion and patchy opacities at the right lung base. Electronically signed by: Oneil Devonshire MD 09/06/2024 10:45 PM EST RP Workstation: [id number]   IR Catheter Tube Change Result Date: 09/05/2024 INDICATION: 64 year old with history of a perforated gastric ulcer and perihepatic abscess. Percutaneous drain was placed in the perihepatic abscess on 08/28/2024. Follow-up imaging demonstrates a large residual perihepatic collection. EXAM: 1. DRAIN INJECTION 2. DRAIN UPSIZE AND REPOSITIONING MEDICATIONS: Moderate sedation ANESTHESIA/SEDATION: Moderate (conscious) sedation was employed during this procedure. A total of Versed  2 mg and fentanyl  175 mcg was administered intravenously at the order of the provider performing the procedure. Total intra-service moderate sedation time: 30 minutes. Patient's level of consciousness and vital signs were monitored continuously by radiology nurse throughout the procedure under the supervision of the provider performing the procedure. COMPLICATIONS: None immediate. CONTRAST:  15 mL Omnipaque  300 FLUOROSCOPY TIME:  Radiation Exposure Index (as provided by the fluoroscopic device): 24 mGy Kerma PROCEDURE: Informed written consent was obtained from the patient after a thorough discussion of the procedural risks, benefits and alternatives. All questions were addressed. Maximal Sterile Barrier Technique was utilized including caps, mask, sterile gowns, sterile gloves, sterile drape, hand hygiene and skin antiseptic. A timeout was performed prior to the initiation of the procedure. The right upper abdomen and existing drain were prepped and draped in sterile fashion. Ultrasound demonstrated heterogeneous hyperechoic material in the perihepatic collection. Scout image was obtained. Contrast was injected through the  existing drain with fluoroscopic guidance. Contrast filled the large collection. Skin around the drain was anesthetized with 1% lidocaine . The drain was cut and removed over a superstiff Amplatz wire. Kumpe catheter was advanced into the collection and repositioned in the medial and superior aspect of the collection. The tract was dilated to accommodate a 14 French multipurpose drain. Large amount of thick tan colored fluid with small pieces was aspirated. It was difficult to aspirate because the catheter was occluding with the small pieces. Rigorous irrigation was needed to remove the fluid. Due to the small pieces that were occluding the catheter, the drain was exchanged for a 16 French drain. Additional irrigation and aspiration was performed. Drain was positioned along the cephalad aspect of the collection. Drain was attached to a gravity bag. Drain was sutured in place and a bandage was placed. FINDINGS: Ultrasound demonstrated a large heterogeneous and hyperechoic collection around the liver. Contrast injection demonstrates a large perihepatic collection. Large amount of tan colored thick fluid was removed along with small pieces and that were intermittently occluding the drain. At the end of the procedure, the drain was positioned more cephalad within the collection. Approximately 180 mL of tan colored fluid was removed. IMPRESSION: Perihepatic abscess drain was up sized and repositioned using fluoroscopic guidance. 180 mL of thick fluid was removed. Electronically Signed   By: Juliene Balder M.D.   On: 09/05/2024 13:27   CT ABDOMEN PELVIS W CONTRAST Result Date: 09/04/2024 EXAM: CT ABDOMEN AND PELVIS  WITH CONTRAST 09/04/2024 08:13:26 PM TECHNIQUE: CT of the abdomen and pelvis was performed with the administration of 75 mL of iohexol  (OMNIPAQUE ) 350 MG/ML injection. Multiplanar reformatted images are provided for review. Automated exposure control, iterative reconstruction, and/or weight-based adjustment of  the mA/kV was utilized to reduce the radiation dose to as low as reasonably achievable. COMPARISON: Comparison with 08/27/2024. CLINICAL HISTORY: Intra-abdominal abscess. Intra-abdominal abscess. FINDINGS: LOWER CHEST: Bilateral pleural effusions, greater on the right. Atelectasis or consolidation in the lung bases, also greater on the right. This could represent compressive atelectasis or pneumonia. The esophagus is diffusely dilated and filled with ingested material suggesting dysmotility or achalasia. No obstructing lesion is identified. LIVER: Subcapsular abscess in the anterior liver with air-fluid level measuring 5.6 x 9.5 cm diameter. The size is similar to the prior study. A pigtail drainage catheter is in place within the collection. Mild stranding in the soft tissues around the liver edge. GALLBLADDER AND BILE DUCTS: Gallbladder is unremarkable. No biliary ductal dilatation. SPLEEN: No acute abnormality. PANCREAS: No acute abnormality. ADRENAL GLANDS: No acute abnormality. KIDNEYS, URETERS AND BLADDER: 5 mm stone in the lower pole of the right kidney. No hydronephrosis or hydroureter. Nephrograms are symmetrical. No perinephric or periureteral stranding. Urinary bladder is unremarkable. GI AND BOWEL: The stomach, small bowel, and colon are not abnormally distended. Contrast material is present in the colon, suggesting no evidence of obstruction. Appendix is normal. PERITONEUM AND RETROPERITONEUM: No ascites. No free air. VASCULATURE: Aorta is normal in caliber. Calcification of the aorta. No aneurysm. LYMPH NODES: No lymphadenopathy. REPRODUCTIVE ORGANS: The uterus appears surgically absent. No abnormal adnexal masses. BONES AND SOFT TISSUES: Degenerative changes in the spine. Slight anterior subluxation of L4 on L5. No acute bony abnormalities. Motion artifact limits evaluation. No focal soft tissue abnormality. IMPRESSION: 1. Subcapsular abscess in the anterior liver with air-fluid level measuring 5.6 x  9.5 cm diameter, similar to the prior study, with a pigtail drainage catheter in place within the collection. 2. Bilateral pleural effusions, greater on the right, and atelectasis or consolidation in the lung bases, also greater on the right, which may represent compressive atelectasis or pneumonia. 3. Esophageal dysmotility or achalasia with no obstructing lesion identified. Electronically signed by: Elsie Gravely MD 09/04/2024 08:21 PM EST RP Workstation: [id number]    Labs:  CBC: Recent Labs    09/03/24 0542 09/04/24 0543 09/05/24 0226 09/07/24 0232  WBC 6.1 5.8 5.4 6.1  HGB 9.4* 9.6* 8.8* 8.9*  HCT 29.4* 30.4* 26.7* 27.3*  PLT 671* 667* 634* 561*    COAGS: Recent Labs    08/13/24 2127 08/28/24 0848  INR 1.0 1.2    BMP: Recent Labs    09/04/24 0543 09/05/24 0226 09/06/24 0241 09/07/24 0232  NA 141 141 142 138  K 4.1 3.6 3.8 4.0  CL 106 105 105 103  CO2 26 27 26 26   GLUCOSE 76 88 85 81  BUN <5* <5* <5* <5*  CALCIUM  9.2 9.1 8.8* 8.8*  CREATININE 0.73 0.75 0.73 0.84  GFRNONAA >60 >60 >60 >60    LIVER FUNCTION TESTS: Recent Labs    08/27/24 1821 08/28/24 0500 08/31/24 0350 09/01/24 0238  BILITOT 0.3 0.3 0.3 0.3  AST 31 32 20 28  ALT 46* 42 27 29  ALKPHOS 128* 129* 101 102  PROT 6.7 7.0 6.4* 6.7  ALBUMIN 2.4* 2.5* 2.3* 2.5*    Assessment and Plan:  64 y.o. female with gastric antral ulcer, perihepatic abscess s/p 10 fr drain placement by Dr.  Suttle, s/p upsize to 16 Fr by Dr. Philip on 1/26    Fever overnight  No labs this AM, CBC yesterday w/o leukocytosis  Output 50 mL overnight, appears purulent, tan colored.  Cx rare lactobacillus   Drain Location: RUQ Size: Fr size: 16  Date of placement: placement 1/18 upsize 1/26   Currently to: Drain collection device: suction bulb 24 hour output:  Output by Drain (mL) 09/06/24 0701 - 09/06/24 1900 09/06/24 1901 - 09/07/24 0700 09/07/24 0701 - 09/07/24 1900 09/07/24 1901 - 09/08/24 0700 09/08/24 0701 -  09/08/24 0853  Closed System Drain 1 RLQ Bulb (JP) 16 Fr.  50       Interval imaging/drain manipulation:  1/18: 10 fr drain placement Dr. Jennefer  1/22: UGI - limited exam due to poor esophageal emptying make opacification of the distal stomach poor 1/25: CT A/P w/ showed persistent perihepatic abscess  1/26: upsize to 16 Fr 1/28: persistent perihepatic abscess   Current examination: Flushes/aspirates easily.  Insertion site unremarkable. Suture and stat lock in place. Dressed appropriately.   Plan: Discussed repeat CT finding with Dr. Jennefer - does not recommend additional drain placement, recommend trial of tpa - [facility]CS notified, [facility]CS planning for sx next week.   Continue TID flushes with 5 cc NS. Record output Q shift. Dressing changes QD or PRN if soiled.  Call IR APP or on call IR MD if difficulty flushing or sudden change in drain output.  Repeat imaging/possible drain injection once output < 10 mL/QD (excluding flush material). Consideration for drain removal if output is < 10 mL/QD (excluding flush material), pending discussion with the providing surgical service.  Discharge planning: Please contact IR APP or on call IR MD prior to patient d/c to ensure appropriate follow up plans are in place. Typically patient will follow up with IR clinic 10-14 days post d/c for repeat imaging/possible drain injection. IR scheduler will contact patient with date/time of appointment. Patient will need to flush drain QD with 5 cc NS, record output QD, dressing changes every 2-3 days or earlier if soiled.   IR will continue to follow - please call with questions or concerns.   Electronically Signed: Toya VEAR Cousin, PA-C 09/08/2024, 8:50 AM   I spent a total of 15 Minutes at the the patient's bedside AND on the patient's hospital floor or unit, greater than 50% of which was counseling/coordinating care for perihepatic abscess drain f/u.   This chart was dictated using voice recognition software.   Despite best efforts to proofread,  errors can occur which can change the documentation meaning.   "

## 2024-09-08 NOTE — Progress Notes (Signed)
 Arrived at bedside to place PICC.  Upon introducing myself and what I was there for, patient became agitated.  Able to begin explanation of the purpose and risk/benefit.  Patient again became agitated stating  No, No, No, I do not want that.  She had no further questions that would make her feel more comfortable having the line placed.  Glenys Sable, RN VAST at bedside during conversation.  Bedside nurse, Crystal, updated.  If patient becomes agreeable, please reorder PICC.

## 2024-09-08 NOTE — TOC Progression Note (Signed)
 Transition of Care [facility]) - Progression Note    Patient Details  Name: Jennifer [name] MRN: [id number] Date of Birth: 11/11/1960  [facility] CM/SW Contact  Landry DELENA Senters, RN Phone Number: 09/08/2024, 11:26 AM  Clinical Narrative:    Continued medical workup. PICC line and TNA requested by general surgery. Plan for surgery next week due to persistent abscess.   ICM will continue to follow.   Expected Discharge Plan: Home/Self Care Barriers to Discharge: Continued Medical Work up               Expected Discharge Plan and Services       Living arrangements for the past 2 months: Single Family Home                                       Social Drivers of Health (SDOH) Interventions SDOH Screenings   Food Insecurity: No Food Insecurity (08/29/2024)  Housing: Low Risk (08/29/2024)  Transportation Needs: No Transportation Needs (08/29/2024)  Recent Concern: Transportation Needs - Unmet Transportation Needs (08/14/2024)  Utilities: Not At Risk (08/29/2024)  Depression (PHQ2-9): Low Risk (05/24/2024)  Recent Concern: Depression (PHQ2-9) - Medium Risk (03/04/2024)  Tobacco Use: High Risk (08/28/2024)    Readmission Risk Interventions     No data to display

## 2024-09-08 NOTE — Progress Notes (Signed)
 PHARMACY - TOTAL PARENTERAL NUTRITION CONSULT NOTE   Indication: Gastric fistula  Patient Measurements: Height: 5' 4 (162.6 cm) Weight: 81.6 kg (180 lb) IBW/kg (Calculated) : 54.7 TPN AdjBW (KG): 61.4 Body mass index is 30.9 kg/m. Usual Weight: 180-182 lbs  Assessment:  64 yo woman with admitted on 1/2 and found to have gastric ulcer, gastritis and esophageal candidiasis on EGD. Patient left AMA 1/3 due to child care and returned same day with continued abdominal pain and dark stools. Patient developed AKI and was eventually discharged home 1/12. Patient htne called EMS on 1/17 for black tarry stools, vomiting and weakness. CT found large perihepatic abscess secondary to fistulous connection from ruptured gastric ulcer, which was been persistent despite drain placement and drain upsizing. 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 Patient tolerated dysphagia 3 diet well and likes Boost Breeze supplements. Patient was NPO again 1/28 for CT.  On 1/28 Patient reports she ate broccoli last night and this morning. A few bits of broccoli florets are present in abscess drain bag. Pharmacy consulted for TPN.  Glucose / Insulin : A1C 5.7. BG < 100, no SSI Electrolytes: CoCa 9.9, Mg 1.8, others wnl Renal: Scr 0.84, BUN 6 Hepatic: Alk phos/AST/ALT/tbili wnl, albumin 2.5, TG 73 1/19  Intake / Output; MIVF: UOP x1 charted, drain 0ml charted  GI Imaging: 1/17 CT: large R hepatic subcapsular abscess with fistulous communication to gastric antrum  1/22 UGI: esophageal stasis/achalasia, delayed gastric emptying, 1/25 CT: subcapsular abscess in anterior liver, drain in collection 1/27 KUB: Paucity of bowel gas throughout the abdomen  1/28 CT: persistent subcapsular fluid abscess, unchanged  GI Surgeries / Procedures:  1/18 R perihepatic abscess drain placement  1/26 drain upsize  Central access: PICC ordered  1/29 TPN start date: 1/29   Nutritional Goals: Goal TPN rate is 75 mL/hr (provides 104 g of protein and 1753 kcals per day)  RD Assessment: Estimated Needs Total Energy Estimated Needs: 1700-1900 Total Protein Estimated Needs: 90-105 grams Total Fluid Estimated Needs: 1.7-1.9 L  Current Nutrition:  NPO   Plan:  Start TPN at 45 mL/hr at 1800, provides 63 g protein. 1052 kcals, meeting ~65% estimated needs   Electrolytes in TPN: Na 100 mEq/L, K 35 mEq/L, Ca 3 mEq/L, Mg 10 mEq/L, and Phos 18 mmol/L. Cl:Ac 1:1 Add standard MVI and trace elements to TPN Initiate Sensitive q6h SSI and adjust as needed   Monitor TPN labs daily until stable at goal then on Mon/Thurs  Jinnie Door, PharmD, BCPS, [facility] Clinical Pharmacist  Please check AMION for all [facility] Pharmacy phone numbers After 10:00 PM, call Main Pharmacy [phone]

## 2024-09-08 NOTE — Progress Notes (Signed)
 "    Subjective/Chief Complaint: Having some abdominal pain still, but sitting up in the bed in NAD.  Wanting something to eat/drink.  Objective: Vital signs in last 24 hours: Temp:  [100.8 F (38.2 C)-102.8 F (39.3 C)] 101.2 F (38.4 C) (01/28 2338) Pulse Rate:  [59-99] 90 (01/28 2338) Resp:  [17] 17 (01/28 1138) BP: (125-131)/(71-116) 131/71 (01/28 2338) Last BM Date : 09/04/24  Intake/Output from previous day: No intake/output data recorded. Intake/Output this shift: No intake/output data recorded.  WDWN in NAD Abd - soft, upper abdominal tenderness Drain - purulent, minimal output currently, but bits of solid particles in drain, 50cc yesterday of output  Lab Results:  Recent Labs    09/07/24 0232  WBC 6.1  HGB 8.9*  HCT 27.3*  PLT 561*   BMET Recent Labs    09/07/24 0232 09/08/24 0242  NA 138 138  K 4.0 3.9  CL 103 103  CO2 26 24  GLUCOSE 81 78  BUN <5* 6*  CREATININE 0.84 0.84  CALCIUM  8.8* 8.7*    Anti-infectives: Anti-infectives (From admission, onward)    Start     Dose/Rate Route Frequency Ordered Stop   08/30/24 1245  Ampicillin -Sulbactam (UNASYN ) 3 g in sodium chloride  0.9 % 100 mL IVPB        3 g 200 mL/hr over 30 Minutes Intravenous Every 6 hours 08/30/24 1151     08/29/24 0815  micafungin  (MYCAMINE ) 100 mg in sodium chloride  0.9 % 100 mL IVPB        100 mg 105 mL/hr over 1 Hours Intravenous Every 24 hours 08/29/24 0807     08/28/24 1200  vancomycin  (VANCOREADY) IVPB 750 mg/150 mL  Status:  Discontinued        750 mg 150 mL/hr over 60 Minutes Intravenous Every 12 hours 08/27/24 2352 08/29/24 0926   08/28/24 1000  fluconazole  (DIFLUCAN ) tablet 400 mg        400 mg Oral Daily 08/27/24 2351 08/28/24 0928   08/28/24 0400  piperacillin -tazobactam (ZOSYN ) IVPB 3.375 g  Status:  Discontinued        3.375 g 12.5 mL/hr over 240 Minutes Intravenous Every 8 hours 08/27/24 2352 08/30/24 1150   08/27/24 2345  vancomycin  (VANCOREADY) IVPB 1750 mg/350  mL        1,750 mg 175 mL/hr over 120 Minutes Intravenous  Once 08/27/24 2343 08/28/24 0706   08/27/24 2245  piperacillin -tazobactam (ZOSYN ) IVPB 3.375 g        3.375 g 100 mL/hr over 30 Minutes Intravenous  Once 08/27/24 2239 08/27/24 2325       Assessment/Plan: 64 yo female with a known gastric antral ulcer, presenting with acute abdominal pain and a large upper abdominal abscess, consistent with a contained perforation. S/p percutaneous drain placement 1/18. -IR with upsize of drain 1/26, cont to monitor -continues to have fevers.  On unasyn  and micafungin . - CT confirms suspicion of gastric perforation contained within this abscess cavity now.  Her nutrition is poor.  Will plan to place a PICC and start TNA.  She will need an operation likely next week for distal gastrectomy and evacuation of this abscess. -  WBC still normal, HR is normal as is BP - CLD for now is ok - Abx per ID.   - Continue PPI and antibiotics - Will need repeat EGD in a few weeks to ensure resolution of ulcer and exclude an underlying malignancy. -d/w primary service -biggest issue moving forward is patient's consentability.  She  does not seem to have good insight or understanding into her problem or situation.  D/w primary service regarding trying to find family or someone to help her.  If unable, may need to do emergency consent for OR.   LOS: 12 days    Burnard FORBES Banter 09/08/2024  "

## 2024-09-09 ENCOUNTER — Other Ambulatory Visit: Payer: Self-pay

## 2024-09-09 DIAGNOSIS — K922 Gastrointestinal hemorrhage, unspecified: Secondary | ICD-10-CM | POA: Diagnosis not present

## 2024-09-09 LAB — CBC WITH DIFFERENTIAL/PLATELET
Abs Immature Granulocytes: 0.03 10*3/uL (ref 0.00–0.07)
Basophils Absolute: 0 10*3/uL (ref 0.0–0.1)
Basophils Relative: 0 %
Eosinophils Absolute: 0.1 10*3/uL (ref 0.0–0.5)
Eosinophils Relative: 1 %
HCT: 27.3 % — ABNORMAL LOW (ref 36.0–46.0)
Hemoglobin: 8.8 g/dL — ABNORMAL LOW (ref 12.0–15.0)
Immature Granulocytes: 1 %
Lymphocytes Relative: 15 %
Lymphs Abs: 0.9 10*3/uL (ref 0.7–4.0)
MCH: 28 pg (ref 26.0–34.0)
MCHC: 32.2 g/dL (ref 30.0–36.0)
MCV: 86.9 fL (ref 80.0–100.0)
Monocytes Absolute: 0.9 10*3/uL (ref 0.1–1.0)
Monocytes Relative: 15 %
Neutro Abs: 4 10*3/uL (ref 1.7–7.7)
Neutrophils Relative %: 68 %
Platelets: 450 10*3/uL — ABNORMAL HIGH (ref 150–400)
RBC: 3.14 MIL/uL — ABNORMAL LOW (ref 3.87–5.11)
RDW: 15 % (ref 11.5–15.5)
Smear Review: NORMAL
WBC: 5.8 10*3/uL (ref 4.0–10.5)
nRBC: 0 % (ref 0.0–0.2)

## 2024-09-09 LAB — BASIC METABOLIC PANEL WITH GFR
Anion gap: 10 (ref 5–15)
BUN: 6 mg/dL — ABNORMAL LOW (ref 8–23)
CO2: 24 mmol/L (ref 22–32)
Calcium: 8.6 mg/dL — ABNORMAL LOW (ref 8.9–10.3)
Chloride: 104 mmol/L (ref 98–111)
Creatinine, Ser: 0.67 mg/dL (ref 0.44–1.00)
GFR, Estimated: >60 mL/min
Glucose, Bld: 83 mg/dL (ref 70–99)
Potassium: 3.9 mmol/L (ref 3.5–5.1)
Sodium: 138 mmol/L (ref 135–145)

## 2024-09-09 LAB — GLUCOSE, CAPILLARY
Glucose-Capillary: 79 mg/dL (ref 70–99)
Glucose-Capillary: 87 mg/dL (ref 70–99)

## 2024-09-09 MED ORDER — HYDROCODONE-ACETAMINOPHEN 7.5-325 MG PO TABS
1.0000 | ORAL_TABLET | Freq: Four times a day (QID) | ORAL | Status: DC | PRN
  Administered 2024-09-09: 1 via ORAL
  Filled 2024-09-09: qty 1

## 2024-09-09 MED ORDER — OLANZAPINE 10 MG IM SOLR
5.0000 mg | Freq: Four times a day (QID) | INTRAMUSCULAR | Status: AC | PRN
  Administered 2024-09-16: 5 mg via INTRAMUSCULAR
  Filled 2024-09-09 (×2): qty 10

## 2024-09-09 MED ORDER — SODIUM CHLORIDE 0.9% FLUSH: 10.0000 mL | INTRAVENOUS | Status: DC | PRN

## 2024-09-09 MED ORDER — OLANZAPINE 5 MG PO TABS
5.0000 mg | ORAL_TABLET | Freq: Two times a day (BID) | ORAL | Status: AC
  Administered 2024-09-09 – 2024-09-16 (×14): 5 mg via ORAL
  Filled 2024-09-09 (×16): qty 1

## 2024-09-09 MED ORDER — SODIUM CHLORIDE 0.9% FLUSH
10.0000 mL | Freq: Two times a day (BID) | INTRAVENOUS | Status: DC
  Administered 2024-09-09: 10 mL

## 2024-09-09 MED ORDER — HYDROCODONE-ACETAMINOPHEN 7.5-325 MG PO TABS
1.0000 | ORAL_TABLET | ORAL | Status: DC | PRN
  Administered 2024-09-09 – 2024-09-15 (×23): 1 via ORAL
  Filled 2024-09-09 (×23): qty 1

## 2024-09-09 MED ORDER — TRAVASOL 10 % IV SOLN
INTRAVENOUS | Status: AC
  Filled 2024-09-09: qty 487.2

## 2024-09-09 MED ORDER — CHLORHEXIDINE GLUCONATE CLOTH 2 % EX PADS
6.0000 | MEDICATED_PAD | Freq: Every day | CUTANEOUS | Status: AC
  Administered 2024-09-09 – 2024-09-16 (×6): 6 via TOPICAL

## 2024-09-09 MED ORDER — OLANZAPINE 5 MG PO TABS
5.0000 mg | ORAL_TABLET | Freq: Four times a day (QID) | ORAL | Status: AC | PRN
  Filled 2024-09-09: qty 1

## 2024-09-09 NOTE — Progress Notes (Signed)
 Psychiatric Nurse Liaison (PNL) Rounding Note   Noted Patient Behaviors: Patient asleep.  Eyes closed.  Respirations even and unlabored.   Interventions Initiated by Psychiatric Nurse Liaison: None needed at this time.   Recommendations for Patient Care: No new recommendations.   Patients Response to Treatment: Patient asleep.    Time Spent with Patient:  1 min

## 2024-09-09 NOTE — Plan of Care (Signed)

## 2024-09-09 NOTE — Progress Notes (Signed)
 Peripherally Inserted Central Catheter Placement  The IV Nurse has discussed with the patient and/or persons authorized to consent for the patient, the purpose of this procedure and the potential benefits and risks involved with this procedure.  The benefits include less needle sticks, lab draws from the catheter, and the patient may be discharged home with the catheter. Risks include, but not limited to, infection, bleeding, blood clot (thrombus formation), and puncture of an artery; nerve damage and irregular heartbeat and possibility to perform a PICC exchange if needed/ordered by physician.  Alternatives to this procedure were also discussed.  Bard Power PICC patient education guide, fact sheet on infection prevention and patient information card has been provided to patient /or left at bedside.    PICC Placement Documentation  PICC Double Lumen 09/09/24 Right Brachial 37 cm 0 cm (Active)  Indication for Insertion or Continuance of Line Administration of hyperosmolar/irritating solutions (i.e. TPN, Vancomycin , etc.) 09/09/24 1000  Exposed Catheter (cm) 0 cm 09/09/24 1000  Site Assessment Clean, Dry, Intact 09/09/24 1000  Lumen #1 Status Flushed;Saline locked;Blood return noted 09/09/24 1000  Lumen #2 Status Flushed;Saline locked;Blood return noted 09/09/24 1000  Dressing Type Transparent;Securing device 09/09/24 1000  Dressing Status Antimicrobial disc/dressing in place;Clean, Dry, Intact 09/09/24 1000  Line Care Connections checked and tightened 09/09/24 1000  Line Adjustment (NICU/IV Team Only) No 09/09/24 1000  Dressing Intervention New dressing;Adhesive placed at insertion site (IV team only) 09/09/24 1000  Dressing Change Due 09/15/24 09/09/24 1000       Ethyl Priestly Renee 09/09/2024, 10:15 AM

## 2024-09-09 NOTE — Progress Notes (Signed)
 "    Subjective/Chief Complaint: Patient immediate upset and yelling at me upon my entrance about chicken. I asked how she was doing and she just started telling that I was lying to her.  I informed her that I hadn't even told her anything yet today.  She ultimately says that she wants her drain out and to leave.  She states she is mad because we are all lying to her about what's going on.  Just got her picc line prior to my arrival  Objective: Vital signs in last 24 hours: Temp:  [98.2 F (36.8 C)-99 F (37.2 C)] 98.5 F (36.9 C) (01/30 1045) Pulse Rate:  [80-88] 80 (01/30 1045) Resp:  [18] 18 (01/30 1045) BP: (120-122)/(81-86) 122/81 (01/30 1045) SpO2:  [95 %-96 %] 96 % (01/30 1045) Last BM Date : 09/09/24  Intake/Output from previous day: No intake/output data recorded. Intake/Output this shift: Total I/O In: 850 [P.O.:840; Other:10] Out: 10 [Drains:10]  WDWN in NAD Abd - soft, upper abdominal tenderness Drain - purulent, about 50-100cc of output in drain right now.  Otherwise doesn't want me to examine her  Lab Results:  Recent Labs    09/07/24 0232 09/09/24 0619  WBC 6.1 5.8  HGB 8.9* 8.8*  HCT 27.3* 27.3*  PLT 561* 450*    BMET Recent Labs    09/08/24 0242 09/09/24 0619  NA 138 138  K 3.9 3.9  CL 103 104  CO2 24 24  GLUCOSE 78 83  BUN 6* 6*  CREATININE 0.84 0.67  CALCIUM  8.7* 8.6*    Anti-infectives: Anti-infectives (From admission, onward)    Start     Dose/Rate Route Frequency Ordered Stop   08/30/24 1245  Ampicillin -Sulbactam (UNASYN ) 3 g in sodium chloride  0.9 % 100 mL IVPB        3 g 200 mL/hr over 30 Minutes Intravenous Every 6 hours 08/30/24 1151     08/29/24 0815  micafungin  (MYCAMINE ) 100 mg in sodium chloride  0.9 % 100 mL IVPB        100 mg 105 mL/hr over 1 Hours Intravenous Every 24 hours 08/29/24 0807     08/28/24 1200  vancomycin  (VANCOREADY) IVPB 750 mg/150 mL  Status:  Discontinued        750 mg 150 mL/hr over 60 Minutes  Intravenous Every 12 hours 08/27/24 2352 08/29/24 0926   08/28/24 1000  fluconazole  (DIFLUCAN ) tablet 400 mg        400 mg Oral Daily 08/27/24 2351 08/28/24 0928   08/28/24 0400  piperacillin -tazobactam (ZOSYN ) IVPB 3.375 g  Status:  Discontinued        3.375 g 12.5 mL/hr over 240 Minutes Intravenous Every 8 hours 08/27/24 2352 08/30/24 1150   08/27/24 2345  vancomycin  (VANCOREADY) IVPB 1750 mg/350 mL        1,750 mg 175 mL/hr over 120 Minutes Intravenous  Once 08/27/24 2343 08/28/24 0706   08/27/24 2245  piperacillin -tazobactam (ZOSYN ) IVPB 3.375 g        3.375 g 100 mL/hr over 30 Minutes Intravenous  Once 08/27/24 2239 08/27/24 2325       Assessment/Plan: 64 yo female with a known gastric antral ulcer, presenting with acute abdominal pain and a large upper abdominal abscess, consistent with a contained perforation. S/p percutaneous drain placement 1/18. -IR with upsize of drain 1/26, cont to monitor -fevers currently improved, AF for last 24 hrs.  On unasyn  and micafungin . - CT confirms suspicion of gastric perforation contained within this abscess cavity now.  Her nutrition is poor.  Will plan to place a PICC and start TNA.  She will need an operation likely next week for distal gastrectomy and evacuation of this abscess. -  WBC still normal, HR is normal as is BP - CLD for now is ok - Abx per ID.   - Continue PPI and antibiotics -patient was very aggressive today and did not understand or make any sense in our conversation.  She accused me of lying to her about things that I had not even every discussed with her.  She does not have an understanding of her situation at all.  d/w primary service.  They have requested psych to see her.  Strongly doubt she has capacity for any medical decision making based on my interactions with her this week.   LOS: 13 days    Burnard FORBES Banter 09/09/2024  "

## 2024-09-09 NOTE — TOC Progression Note (Addendum)
 Transition of Care [facility]) - Progression Note    Patient Details  Name: Jennifer [name] MRN: [id number] Date of Birth: Aug 12, 1960  [facility] CM/SW Contact  Inocente GORMAN Kindle, LCSW Phone Number: 09/09/2024, 12:33 PM  Clinical Narrative:    CSW received request to IVC patient for medical IVC. CSW submitted completed paperwork to [facility] for review, Envelope # [id number], and spoke with magistrate.   Case accepted, Case ID# [id number]. [employer] to serve.    Expected Discharge Plan: Home/Self Care Barriers to Discharge: Continued Medical Work up               Expected Discharge Plan and Services       Living arrangements for the past 2 months: Single Family Home                                       Social Drivers of Health (SDOH) Interventions SDOH Screenings   Food Insecurity: No Food Insecurity (08/29/2024)  Housing: Low Risk (08/29/2024)  Transportation Needs: No Transportation Needs (08/29/2024)  Recent Concern: Transportation Needs - Unmet Transportation Needs (08/14/2024)  Utilities: Not At Risk (08/29/2024)  Depression (PHQ2-9): Low Risk (05/24/2024)  Recent Concern: Depression (PHQ2-9) - Medium Risk (03/04/2024)  Tobacco Use: High Risk (08/28/2024)    Readmission Risk Interventions     No data to display

## 2024-09-09 NOTE — Progress Notes (Signed)
 PHARMACY - TOTAL PARENTERAL NUTRITION CONSULT NOTE   Indication: Gastric fistula  Patient Measurements: Height: 5' 4 (162.6 cm) Weight: 81.6 kg (180 lb) IBW/kg (Calculated) : 54.7 TPN AdjBW (KG): 61.4 Body mass index is 30.9 kg/m. Usual Weight: 180-182 lbs  Assessment:  64 yo woman with admitted on 1/2 and found to have gastric ulcer, gastritis and esophageal candidiasis on EGD. Patient left AMA 1/3 due to child care and returned same day with continued abdominal pain and dark stools. Patient developed AKI and was eventually discharged home 1/12. Patient called EMS on 1/17 for black tarry stools, vomiting and weakness. CT found large perihepatic abscess secondary to fistulous connection from ruptured gastric ulcer, which was been persistent despite drain placement and drain upsizing. 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 Patient tolerated dysphagia 3 diet well and likes Boost Breeze supplements. Patient was NPO again 1/28 for CT.  On 1/28 Patient reports she ate broccoli last night and this morning. A few bits of broccoli florets are present in abscess drain bag. Pharmacy consulted for TPN.  1/30 refused PICC line 1/29 so she did not get TPN but agreeable today to receive. Pending surgery next week. Surgery approved clear liquids for now.   Glucose / Insulin : A1C 5.7. BG < 100, sSSI ordered, not required yet  Electrolytes: CoCa 9.8, Mg 1.8 (2 grams given), others wnl Renal: Scr 0.67, BUN 6 Hepatic: Alk phos/AST/ALT/tbili wnl, albumin  2.5, TG 73 1/19  Intake / Output; MIVF: UOP not charted, drain not charted  GI Imaging: 1/17 CT: large R hepatic subcapsular abscess with fistulous communication to gastric antrum  1/22 UGI: esophageal stasis/achalasia, delayed gastric emptying, 1/25 CT: subcapsular abscess in anterior liver, drain in collection 1/27 KUB: Paucity of bowel gas throughout the  abdomen  1/28 CT: persistent subcapsular fluid abscess, unchanged  GI Surgeries / Procedures:  1/18 R perihepatic abscess drain placement  1/26 drain upsize  Central access: PICC ordered 1/29 TPN start date: 1/29   Nutritional Goals: Goal TPN rate is 75 mL/hr (provides 104 g of protein and 1753 kcals per day)  RD Assessment: Estimated Needs Total Energy Estimated Needs: 1700-1900 Total Protein Estimated Needs: 90-105 grams Total Fluid Estimated Needs: 1.7-1.9 L  Current Nutrition:  Clear liquid diet + TPN   Plan:  Start TPN at 35 mL/hr at 1800, provides ~47% estimated needs   Electrolytes in TPN: Na 100 mEq/L, K 35 mEq/L, Ca 3 mEq/L, Mg 10 mEq/L, and Phos 18 mmol/L. Cl:Ac 1:1 Add standard MVI and trace elements to TPN Initiate Sensitive q6h SSI and adjust as needed   Monitor TPN labs daily until stable at goal then on Mon/Thurs Thiamine  100 mg in TPN   Rankin Sams, PharmD, [facility] Clinical Pharmacist

## 2024-09-09 NOTE — Consult Note (Signed)
 [facility] Psychiatric Consult Initial  Patient Name: .Jennifer [name]  MRN: [id number]  DOB: July 30, 1961  Consult Order details:  Orders (From admission, onward)     Start     Ordered   09/09/24 1024  IP CONSULT TO PSYCHIATRY       Ordering Provider: Dennise Lavada POUR, MD  Provider:  (Not yet assigned)  Question Answer Comment  Location [facility]   Reason for Consult? Refusing tests, procedures, question if she has appropriate capacity to decide, question if she requires IVC and is threatening to leave multiple times.      09/09/24 1023             Mode of Visit: In person    Psychiatry Consult Evaluation  Service Date: September 09, 2024 LOS:  LOS: 13 days  Chief Complaint Agitation and Capacity  Primary Psychiatric Diagnoses  History of Schizophrenia 2.  R/O unspecified Personality Disorder  Assessment  LASHUNDRA [name] is a 64 y.o. female admitted: Medicallyfor 08/27/2024  4:28 PM for GI Bleed. She carries the psychiatric diagnoses of Schizophrenia per chart review.   On initial examination, patient patient was seen sitting up in hospital bed accompanied by Dr. Dennise. Per Dr. Dennise the patient has been refusing treatment and labs in the hospital. 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 On introduction of being part of the psychiatric team the patient began yelling stating, I don't want to talk to you get out my room. Attempts were made to explain the utility of the assessment to the patient however she continued to yell and refused to have a conversation. Interview terminated. Please see plan below for detailed recommendations.   Diagnoses:  Active Hospital problems: Principal Problem:   GI bleed Active Problems:   Substance abuse (HCC)   Essential hypertension   Candidiasis of esophagus (HCC)   Hepatic abscess   Pleural effusion on right   History of GI bleed    History of alcohol use   Hypokalemia   Liver abscess   Gastric perforation (HCC)   Fistula   Esophageal candidiasis (HCC)   Polysubstance abuse (HCC)   Melena   Gastric fistula    Plan   ## Psychiatric Medication Recommendations:  -Initiate Zyprexa  5 mg PO BID  ## Medical Decision Making Capacity: Patient does not have capacity to refuse necessary medical interventions at this time. She is unable to participate with examination and per treatment team the patient has been refusing all treatment within the hospital without explanation. The patient does not have capacity to leave the hospital AMA currently.  ## Further Work-up:  -- Per Primary Team -- Pertinent labwork reviewed earlier this admission includes:  Recent Results (from the past 2160 hours)  HIV Antibody (routine testing w rflx)     Status: None   Collection Time: 07/14/24 10:07 AM  Result Value Ref Range   HIV Screen 4th Generation wRfx Non Reactive Non Reactive    Comment: HIV-1/HIV-2 antibodies and HIV-1 p24 antigen were NOT detected. There is no laboratory evidence of HIV infection. HIV Negative   H. pylori breath test     Status: None   Collection Time: 07/14/24 10:07 AM  Result Value Ref Range   H pylori Breath Test CANCELED     Comment: Test not performed. No specimen received.  Result canceled by the ancillary.   Hemoglobin A1c     Status:  Abnormal   Collection Time: 07/14/24 10:07 AM  Result Value Ref Range   Hgb A1c MFr Bld 5.7 (H) 4.8 - 5.6 %    Comment:          Prediabetes: 5.7 - 6.4          Diabetes: >6.4          Glycemic control for adults with diabetes: <7.0    Est. average glucose Bld gHb Est-mCnc 117 mg/dL  H. pylori Breath Collection     Status: None   Collection Time: 07/14/24 10:07 AM  Result Value Ref Range   H. pylori Breath Collection Comment     Comment: This specimen was collected by [employer].  CBG monitoring, ED     Status: Abnormal   Collection Time: 08/12/24  8:08  AM  Result Value Ref Range   Glucose-Capillary 110 (H) 70 - 99 mg/dL    Comment: Glucose reference range applies only to samples taken after fasting for at least 8 hours.  Lipase, blood     Status: None   Collection Time: 08/12/24  8:14 AM  Result Value Ref Range   Lipase 26 11 - 51 U/L    Comment: Performed at [facility], [address] [place], [place] [address]  Comprehensive metabolic panel     Status: Abnormal   Collection Time: 08/12/24  8:14 AM  Result Value Ref Range   Sodium 140 135 - 145 mmol/L   Potassium 3.7 3.5 - 5.1 mmol/L   Chloride 104 98 - 111 mmol/L   CO2 24 22 - 32 mmol/L   Glucose, Bld 116 (H) 70 - 99 mg/dL    Comment: Glucose reference range applies only to samples taken after fasting for at least 8 hours.   BUN 29 (H) 8 - 23 mg/dL   Creatinine, Ser 8.80 (H) 0.44 - 1.00 mg/dL   Calcium  10.7 (H) 8.9 - 10.3 mg/dL   Total Protein 7.8 6.5 - 8.1 g/dL   Albumin  4.3 3.5 - 5.0 g/dL   AST 21 15 - 41 U/L   ALT 17 0 - 44 U/L   Alkaline Phosphatase 63 38 - 126 U/L   Total Bilirubin 0.6 0.0 - 1.2 mg/dL   GFR, Estimated 52 (L) >60 mL/min    Comment: (NOTE) Calculated using the CKD-EPI Creatinine Equation (2021)    Anion gap 12 5 - 15    Comment: Performed at [facility], [address], [place], [place] [address]  CBC     Status: Abnormal   Collection Time: 08/12/24  8:14 AM  Result Value Ref Range   WBC 7.9 4.0 - 10.5 K/uL   RBC 5.32 (H) 3.87 - 5.11 MIL/uL   Hemoglobin 15.7 (H) 12.0 - 15.0 g/dL   HCT 52.5 (H) 63.9 - 53.9 %   MCV 89.1 80.0 - 100.0 fL   MCH 29.5 26.0 - 34.0 pg   MCHC 33.1 30.0 - 36.0 g/dL   RDW 85.3 88.4 - 84.4 %   Platelets 246 150 - 400 K/uL   nRBC 0.0 0.0 - 0.2 %    Comment: Performed at [facility], [address], [place], [place] [address]  Urinalysis, Routine w reflex microscopic -Urine, Clean Catch     Status: Abnormal   Collection Time: 08/12/24 10:13 AM  Result Value Ref  Range   Color, Urine YELLOW YELLOW   APPearance CLOUDY (A) CLEAR   Specific Gravity, Urine >1.046 (H) 1.005 -  1.030   pH 5.0 5.0 - 8.0   Glucose, UA NEGATIVE NEGATIVE mg/dL   Hgb urine dipstick NEGATIVE NEGATIVE   Bilirubin Urine NEGATIVE NEGATIVE   Ketones, ur NEGATIVE NEGATIVE mg/dL   Protein, ur 899 (A) NEGATIVE mg/dL   Nitrite NEGATIVE NEGATIVE   Leukocytes,Ua NEGATIVE NEGATIVE   RBC / HPF 0-5 0 - 5 RBC/hpf   WBC, UA 0-5 0 - 5 WBC/hpf   Bacteria, UA RARE (A) NONE SEEN   Squamous Epithelial / HPF 11-20 0 - 5 /HPF   Mucus PRESENT     Comment: Performed at [facility], [address] [place], [place] [address]  POC occult blood, ED     Status: Abnormal   Collection Time: 08/12/24 10:48 AM  Result Value Ref Range   Fecal Occult Bld POSITIVE (A) NEGATIVE  Hemoglobin and hematocrit, blood     Status: None   Collection Time: 08/12/24  6:38 PM  Result Value Ref Range   Hemoglobin 14.1 12.0 - 15.0 g/dL   HCT 56.8 63.9 - 53.9 %    Comment: Performed at [facility], [address] [place], [place] [address]  Surgical PCR screen     Status: None   Collection Time: 08/13/24  3:31 AM   Specimen: Nasal Mucosa; Nasal Swab  Result Value Ref Range   MRSA, PCR NEGATIVE NEGATIVE   Staphylococcus aureus NEGATIVE NEGATIVE    Comment: (NOTE) The Xpert SA Assay (FDA approved for NASAL specimens in patients 22 years of age and older), is one component of a comprehensive surveillance program. It is not intended to diagnose infection nor to guide or monitor treatment. Performed at [facility], [address] [place], [place] [address]   Basic metabolic panel     Status: Abnormal   Collection Time: 08/13/24  5:19 AM  Result Value Ref Range   Sodium 140 135 - 145 mmol/L   Potassium 3.9 3.5 - 5.1 mmol/L   Chloride 104 98 - 111 mmol/L   CO2 24 22 - 32 mmol/L   Glucose, Bld 104 (H) 70 - 99 mg/dL    Comment: Glucose reference range  applies only to samples taken after fasting for at least 8 hours.   BUN 38 (H) 8 - 23 mg/dL   Creatinine, Ser 8.36 (H) 0.44 - 1.00 mg/dL   Calcium  10.2 8.9 - 10.3 mg/dL   GFR, Estimated 35 (L) >60 mL/min    Comment: (NOTE) Calculated using the CKD-EPI Creatinine Equation (2021)    Anion gap 12 5 - 15    Comment: Performed at [facility], [address], [place], [place] [address]  CBC     Status: None   Collection Time: 08/13/24  5:19 AM  Result Value Ref Range   WBC 7.6 4.0 - 10.5 K/uL   RBC 4.78 3.87 - 5.11 MIL/uL   Hemoglobin 13.8 12.0 - 15.0 g/dL   HCT 57.4 63.9 - 53.9 %   MCV 88.9 80.0 - 100.0 fL   MCH 28.9 26.0 - 34.0 pg   MCHC 32.5 30.0 - 36.0 g/dL   RDW 85.3 88.4 - 84.4 %   Platelets 195 150 - 400 K/uL   nRBC 0.0 0.0 - 0.2 %    Comment: Performed at [facility], [address] [place], [place] [address]  Cytology - Non PAP;     Status: None   Collection Time: 08/13/24  8:30 AM  Result Value Ref Range  CYTOLOGY - NON GYN      CYTOLOGY - NON PAP CASE: [id number] PATIENT: Saretta Cedano Non-Gynecological Cytology Report     Clinical History: Melena, abdominal pain, abnormal CT. Specimen Submitted:  A. ESOPHAGEAL, BRUSHING:   FINAL MICROSCOPIC DIAGNOSIS: - Benign reactive/reparative changes - Esophageal candidiasis - Negative for viral cytopathic effect and malignancy  SPECIMEN ADEQUACY: Satisfactory for evaluation  MICROORGANISMS: - Candida spp.  GROSS: Received is/are 50 ccs of slightly cloudy fluid with brush. (NT:nt) Prepared: Smears:  0 Concentration Method (Thin Prep):  1 Cell Block:  1 Additional Studies:  n/a     Final Diagnosis performed by Rexene Daily, MD.   Electronically signed 08/17/2024 Technical component performed at [facility], [address], [place], [place] [address].  Professional component performed at [facility]. [address]  [place], [place] [address]  Immunohistochemistry Technical component  (if applicable) was performed at [employer]. [address], [place], [place] [address].  IMMUNOHISTOCHEMISTRY DISCLAIMER (if applicable): Some of these immunohistochemical stains may have been developed and the performance characteristics determine by [facility]. Some may not have been cleared or approved by the U.S. Food and Drug Administration. The FDA has determined that such clearance or approval is not necessary. This test is used for clinical purposes. It should not be regarded as investigational or for research. This laboratory is certified under the Clinical Laboratory Improvement Amendments of 1988 (CLIA-88) as qualified to perform high complexity clinical laboratory testing.  The controls stained appropriately.   CBC with Differential     Status: Abnormal   Collection Time: 08/13/24  8:25 PM  Result Value Ref Range   WBC 10.8 (H) 4.0 - 10.5 K/uL   RBC 4.87 3.87 - 5.11 MIL/uL   Hemoglobin 14.4 12.0 - 15.0 g/dL   HCT 55.9 63.9 - 53.9 %   MCV 90.3 80.0 - 100.0 fL   MCH 29.6 26.0 - 34.0 pg   MCHC 32.7 30.0 - 36.0 g/dL   RDW 85.2 88.4 - 84.4 %   Platelets 209 150 - 400 K/uL   nRBC 0.0 0.0 - 0.2 %   Neutrophils Relative % 80 %   Neutro Abs 8.5 (H) 1.7 - 7.7 K/uL   Lymphocytes Relative 14 %   Lymphs Abs 1.6 0.7 - 4.0 K/uL   Monocytes Relative 6 %   Monocytes Absolute 0.7 0.1 - 1.0 K/uL   Eosinophils Relative 0 %   Eosinophils Absolute 0.0 0.0 - 0.5 K/uL   Basophils Relative 0 %   Basophils Absolute 0.0 0.0 - 0.1 K/uL   Immature Granulocytes 0 %   Abs Immature Granulocytes 0.03 0.00 - 0.07 K/uL    Comment: Performed at [facility], [address] [place], [place] [address]  Comprehensive metabolic panel     Status: Abnormal   Collection Time: 08/13/24  8:25 PM  Result Value Ref Range   Sodium 143 135 - 145 mmol/L   Potassium 4.2 3.5 - 5.1  mmol/L   Chloride 104 98 - 111 mmol/L   CO2 26 22 - 32 mmol/L   Glucose, Bld 107 (H) 70 - 99 mg/dL    Comment: Glucose reference range applies only to samples taken after fasting for at least 8 hours.   BUN 41 (H) 8 - 23 mg/dL   Creatinine, Ser 7.33 (H) 0.44 - 1.00 mg/dL    Comment: Delta check noted    Calcium  10.7 (H) 8.9 - 10.3 mg/dL  Total Protein 8.0 6.5 - 8.1 g/dL   Albumin  4.3 3.5 - 5.0 g/dL   AST 37 15 - 41 U/L    Comment: HEMOLYSIS AT THIS LEVEL MAY AFFECT RESULT   ALT 20 0 - 44 U/L   Alkaline Phosphatase 60 38 - 126 U/L   Total Bilirubin 0.4 0.0 - 1.2 mg/dL   GFR, Estimated 20 (L) >60 mL/min    Comment: (NOTE) Calculated using the CKD-EPI Creatinine Equation (2021)    Anion gap 13 5 - 15    Comment: Performed at [facility], [address], [place], [place] [address]  Protime-INR     Status: None   Collection Time: 08/13/24  9:27 PM  Result Value Ref Range   Prothrombin Time 13.5 11.4 - 15.2 seconds   INR 1.0 0.8 - 1.2    Comment: (NOTE) INR goal varies based on device and disease states. Performed at [facility], [address], [place], [place] [address]   Type and screen [facility]     Status: None   Collection Time: 08/13/24  9:27 PM  Result Value Ref Range   ABO/RH(D) O POS    Antibody Screen NEG    Sample Expiration      08/16/2024,2359 Performed at [facility], [address] [place], [place] [address]   Lipase, blood     Status: None   Collection Time: 08/13/24  9:27 PM  Result Value Ref Range   Lipase 36 11 - 51 U/L    Comment: Performed at [facility], [address] [place], [place] [address]  Troponin T, High Sensitivity     Status: Abnormal   Collection Time: 08/13/24 11:07 PM  Result Value Ref Range   Troponin T High Sensitivity 121 (HH) 0 - 19 ng/L    Comment: Critical Value, Read Back and verified with SEVRIER, C RN @ 2353  (NOTE) Biotin  concentrations > 1000 ng/mL falsely decrease TnT results.  Serial cardiac troponin measurements are suggested.  Refer to the Links section for chest pain algorithms and additional  guidance. Performed at [facility], [address] [place], [place] [address]   Troponin T, High Sensitivity     Status: Abnormal   Collection Time: 08/14/24  1:52 AM  Result Value Ref Range   Troponin T High Sensitivity 127 (HH) 0 - 19 ng/L    Comment: Critical Value, Read Back and verified with CHARVEZ, A RN @ 0235 08/14/24 CAL (NOTE) Biotin concentrations > 1000 ng/mL falsely decrease TnT results.  Serial cardiac troponin measurements are suggested.  Refer to the Links section for chest pain algorithms and additional  guidance. Performed at [facility], [address] [place], [place] [address]   Basic metabolic panel     Status: Abnormal   Collection Time: 08/14/24  4:31 AM  Result Value Ref Range   Sodium 141 135 - 145 mmol/L   Potassium 4.4 3.5 - 5.1 mmol/L   Chloride 108 98 - 111 mmol/L   CO2 22 22 - 32 mmol/L   Glucose, Bld 115 (H) 70 - 99 mg/dL    Comment: Glucose reference range applies only to samples taken after fasting for at least 8 hours.   BUN 40 (H) 8 - 23 mg/dL   Creatinine, Ser 8.25 (H) 0.44 - 1.00 mg/dL   Calcium  9.6 8.9 - 10.3 mg/dL   GFR, Estimated 33 (L) >60 mL/min  Comment: (NOTE) Calculated using the CKD-EPI Creatinine Equation (2021)    Anion gap 12 5 - 15    Comment: Performed at [facility], [address] [place], [place] [address]  CBC     Status: None   Collection Time: 08/14/24  4:31 AM  Result Value Ref Range   WBC 8.1 4.0 - 10.5 K/uL   RBC 4.34 3.87 - 5.11 MIL/uL   Hemoglobin 12.6 12.0 - 15.0 g/dL   HCT 61.1 63.9 - 53.9 %   MCV 89.4 80.0 - 100.0 fL   MCH 29.0 26.0 - 34.0 pg   MCHC 32.5 30.0 - 36.0 g/dL   RDW 85.5 88.4 - 84.4 %   Platelets 164 150 - 400 K/uL   nRBC 0.0 0.0 - 0.2 %     Comment: Performed at [facility], [address] [place], [place] [address]  Magnesium      Status: None   Collection Time: 08/14/24  4:31 AM  Result Value Ref Range   Magnesium  2.4 1.7 - 2.4 mg/dL    Comment: Performed at [facility], [address] [place], [place] [address]  ECHOCARDIOGRAM COMPLETE     Status: None   Collection Time: 08/14/24  1:56 PM  Result Value Ref Range   Weight 2,885.38 oz   Height 64 in   BP 120/67 mmHg   S' Lateral 2.20 cm   AR max vel 3.38 cm2   AV Area VTI 3.53 cm2   AV Mean grad 10.0 mmHg   AV Peak grad 19.0 mmHg   Ao pk vel 2.18 m/s   Area-P 1/2 6.88 cm2   AV Area mean vel 3.91 cm2   Est EF > 75%   Hemoglobin and hematocrit, blood     Status: None   Collection Time: 08/14/24  2:36 PM  Result Value Ref Range   Hemoglobin 12.5 12.0 - 15.0 g/dL   HCT 61.3 63.9 - 53.9 %    Comment: Performed at [facility], [address] [place], [place] [address]  Urine Drug Screen     Status: Abnormal   Collection Time: 08/14/24  6:43 PM  Result Value Ref Range   Opiates POSITIVE (A) NEGATIVE   Cocaine POSITIVE (A) NEGATIVE   Benzodiazepines POSITIVE (A) NEGATIVE   Amphetamines NEGATIVE NEGATIVE   Tetrahydrocannabinol NEGATIVE NEGATIVE   Barbiturates NEGATIVE NEGATIVE   Methadone  Scn, Ur NEGATIVE NEGATIVE   Fentanyl  POSITIVE (A) NEGATIVE    Comment: (NOTE) Drug screen is for Medical Purposes only. Positive results are preliminary only. If confirmation is needed, notify lab within 5 days.  Drug Class                 Cutoff (ng/mL) Amphetamine and metabolites 1000 Barbiturate and metabolites 200 Benzodiazepine              200 Opiates and metabolites     300 Cocaine and metabolites     300 THC                         50 Fentanyl                     5 Methadone                    300  Trazodone  is metabolized in vivo to several metabolites,  including pharmacologically active m-CPP, which is  excreted in the  urine.  Immunoassay screens for amphetamines and MDMA have potential  cross-reactivity with these compounds and may provide false positive  result.  Performed at [facility], [address], [place], [place] [address]   Sodium, urine, random     Status: None   Collection Time: 08/14/24  6:43 PM  Result Value Ref Range   Sodium, Ur 48 mmol/L    Comment: NO NORMAL RANGE ESTABLISHED FOR THIS TEST Performed at [facility], [address] [place], [place] [address]   Creatinine, urine, random     Status: None   Collection Time: 08/14/24  6:43 PM  Result Value Ref Range   Creatinine, Urine 244 mg/dL    Comment: NO NORMAL RANGE ESTABLISHED FOR THIS TEST Performed at [facility] [address], [place], [place] [address]   Urinalysis, Routine w reflex microscopic -Urine, Clean Catch     Status: Abnormal   Collection Time: 08/14/24  6:43 PM  Result Value Ref Range   Color, Urine YELLOW YELLOW   APPearance HAZY (A) CLEAR   Specific Gravity, Urine 1.024 1.005 - 1.030   pH 5.0 5.0 - 8.0   Glucose, UA NEGATIVE NEGATIVE mg/dL   Hgb urine dipstick SMALL (A) NEGATIVE   Bilirubin Urine NEGATIVE NEGATIVE   Ketones, ur NEGATIVE NEGATIVE mg/dL   Protein, ur 899 (A) NEGATIVE mg/dL   Nitrite NEGATIVE NEGATIVE   Leukocytes,Ua NEGATIVE NEGATIVE   RBC / HPF 0-5 0 - 5 RBC/hpf   WBC, UA 0-5 0 - 5 WBC/hpf   Bacteria, UA RARE (A) NONE SEEN   Squamous Epithelial / HPF 0-5 0 - 5 /HPF   Mucus PRESENT     Comment: Performed at [facility], [address], [place], [place] [address]  CBC     Status: Abnormal   Collection Time: 08/15/24  3:45 AM  Result Value Ref Range   WBC 8.6 4.0 - 10.5 K/uL   RBC 4.41 3.87 - 5.11 MIL/uL   Hemoglobin 13.0 12.0 - 15.0 g/dL   HCT 60.5 63.9 - 53.9 %   MCV 89.3 80.0 - 100.0 fL   MCH 29.5 26.0 - 34.0 pg   MCHC 33.0 30.0 - 36.0 g/dL   RDW 85.3 88.4 - 84.4 %   Platelets  149 (L) 150 - 400 K/uL   nRBC 0.0 0.0 - 0.2 %    Comment: Performed at [facility], [address], [place], [place] [address]  Basic metabolic panel     Status: Abnormal   Collection Time: 08/15/24  3:45 AM  Result Value Ref Range   Sodium 142 135 - 145 mmol/L   Potassium 4.7 3.5 - 5.1 mmol/L   Chloride 107 98 - 111 mmol/L   CO2 23 22 - 32 mmol/L   Glucose, Bld 95 70 - 99 mg/dL    Comment: Glucose reference range applies only to samples taken after fasting for at least 8 hours.   BUN 27 (H) 8 - 23 mg/dL   Creatinine, Ser 8.90 (H) 0.44 - 1.00 mg/dL   Calcium  9.9 8.9 - 10.3 mg/dL   GFR, Estimated 58 (L) >60 mL/min    Comment: (NOTE) Calculated using the CKD-EPI Creatinine Equation (2021)    Anion gap 12 5 - 15    Comment: Performed at [facility], [address] [place], [place] [address]  CBC     Status: Abnormal   Collection Time: 08/16/24  4:11 AM  Result Value  Ref Range   WBC 8.1 4.0 - 10.5 K/uL   RBC 4.09 3.87 - 5.11 MIL/uL   Hemoglobin 12.1 12.0 - 15.0 g/dL   HCT 63.1 63.9 - 53.9 %   MCV 90.0 80.0 - 100.0 fL   MCH 29.6 26.0 - 34.0 pg   MCHC 32.9 30.0 - 36.0 g/dL   RDW 85.2 88.4 - 84.4 %   Platelets 131 (L) 150 - 400 K/uL   nRBC 0.0 0.0 - 0.2 %    Comment: Performed at [facility], [address] [place], [place] [address]  Basic metabolic panel     Status: Abnormal   Collection Time: 08/16/24  4:11 AM  Result Value Ref Range   Sodium 142 135 - 145 mmol/L   Potassium 5.0 3.5 - 5.1 mmol/L    Comment: HEMOLYSIS AT THIS LEVEL MAY AFFECT RESULT   Chloride 109 98 - 111 mmol/L   CO2 21 (L) 22 - 32 mmol/L   Glucose, Bld 99 70 - 99 mg/dL    Comment: Glucose reference range applies only to samples taken after fasting for at least 8 hours.   BUN 27 (H) 8 - 23 mg/dL   Creatinine, Ser 8.99 0.44 - 1.00 mg/dL   Calcium  10.0 8.9 - 10.3 mg/dL   GFR, Estimated >39 >39 mL/min    Comment: (NOTE) Calculated using the  CKD-EPI Creatinine Equation (2021)    Anion gap 12 5 - 15    Comment: Performed at [facility], [address] [place], [place] [address]  Basic metabolic panel     Status: Abnormal   Collection Time: 08/17/24  3:57 AM  Result Value Ref Range   Sodium 142 135 - 145 mmol/L   Potassium 4.6 3.5 - 5.1 mmol/L   Chloride 108 98 - 111 mmol/L   CO2 23 22 - 32 mmol/L   Glucose, Bld 118 (H) 70 - 99 mg/dL    Comment: Glucose reference range applies only to samples taken after fasting for at least 8 hours.   BUN 18 8 - 23 mg/dL   Creatinine, Ser 8.99 0.44 - 1.00 mg/dL   Calcium  9.7 8.9 - 10.3 mg/dL   GFR, Estimated >39 >39 mL/min    Comment: (NOTE) Calculated using the CKD-EPI Creatinine Equation (2021)    Anion gap 11 5 - 15    Comment: Performed at [facility], [address], [place], [place] [address]  CBC with Differential/Platelet     Status: Abnormal   Collection Time: 08/17/24  3:57 AM  Result Value Ref Range   WBC 9.6 4.0 - 10.5 K/uL   RBC 3.72 (L) 3.87 - 5.11 MIL/uL   Hemoglobin 10.8 (L) 12.0 - 15.0 g/dL   HCT 66.5 (L) 63.9 - 53.9 %   MCV 89.8 80.0 - 100.0 fL   MCH 29.0 26.0 - 34.0 pg   MCHC 32.3 30.0 - 36.0 g/dL   RDW 85.2 88.4 - 84.4 %   Platelets 141 (L) 150 - 400 K/uL    Comment: SPECIMEN CHECKED FOR CLOTS CONSISTENT WITH PREVIOUS RESULT PLATELET COUNT CONFIRMED BY SMEAR REPEATED TO VERIFY    nRBC 0.0 0.0 - 0.2 %   Neutrophils Relative % 81 %   Neutro Abs 7.9 (H) 1.7 - 7.7 K/uL   Lymphocytes Relative 9 %   Lymphs Abs 0.8 0.7 - 4.0 K/uL   Monocytes Relative 8 %   Monocytes Absolute 0.7 0.1 - 1.0 K/uL   Eosinophils Relative  0 %   Eosinophils Absolute 0.0 0.0 - 0.5 K/uL   Basophils Relative 0 %   Basophils Absolute 0.0 0.0 - 0.1 K/uL   WBC Morphology MORPHOLOGY UNREMARKABLE    RBC Morphology MORPHOLOGY UNREMARKABLE    Smear Review Normal platelet morphology    Immature Granulocytes 2 %   Abs Immature Granulocytes 0.15 (H)  0.00 - 0.07 K/uL    Comment: Performed at [facility], [address], [place], [place] [address]  Magnesium      Status: None   Collection Time: 08/17/24  3:57 AM  Result Value Ref Range   Magnesium  2.3 1.7 - 2.4 mg/dL    Comment: Performed at [facility], [address], [place], [place] [address]  Type and screen [facility]     Status: None   Collection Time: 08/27/24  6:19 PM  Result Value Ref Range   ABO/RH(D) O POS    Antibody Screen NEG    Sample Expiration 08/30/2024,2359    Unit Number [id number]    Blood Component Type RBC LR PHER2    Unit division 00    Status of Unit ISSUED,FINAL    Transfusion Status OK TO TRANSFUSE    Crossmatch Result      Compatible Performed at [facility], [address], [place], [place] [address]   BPAM RBC     Status: None   Collection Time: 08/27/24  6:19 PM  Result Value Ref Range   ISSUE DATE / TIME [id number]    Blood Product Unit Number [id number]    PRODUCT CODE [id number]    Unit Type and Rh 5100    Blood Product Expiration Date [id number]   CBC with Differential     Status: Abnormal   Collection Time: 08/27/24  6:21 PM  Result Value Ref Range   WBC 7.8 4.0 - 10.5 K/uL   RBC 3.03 (L) 3.87 - 5.11 MIL/uL   Hemoglobin 8.9 (L) 12.0 - 15.0 g/dL   HCT 72.7 (L) 63.9 - 53.9 %   MCV 89.8 80.0 - 100.0 fL   MCH 29.4 26.0 - 34.0 pg   MCHC 32.7 30.0 - 36.0 g/dL   RDW 85.1 88.4 - 84.4 %   Platelets 528 (H) 150 - 400 K/uL   nRBC 0.4 (H) 0.0 - 0.2 %   Neutrophils Relative % 70 %   Neutro Abs 5.5 1.7 - 7.7 K/uL   Lymphocytes Relative 23 %   Lymphs Abs 1.8 0.7 - 4.0 K/uL   Monocytes Relative 7 %   Monocytes Absolute 0.5 0.1 - 1.0 K/uL   Eosinophils Relative 0 %   Eosinophils Absolute 0.0 0.0 - 0.5 K/uL   Basophils Relative 0 %   Basophils Absolute 0.0 0.0 - 0.1 K/uL   WBC Morphology See Note     Comment:  Morphology unremarkable   Smear Review See Note      Comment:  Normal Platelet Morphology   Burr Cells PRESENT    Polychromasia PRESENT     Comment: Performed at [facility], [address] [place], [place] [address]  Comprehensive metabolic panel     Status: Abnormal   Collection Time: 08/27/24  6:21 PM  Result Value Ref Range   Sodium 139 135 - 145 mmol/L   Potassium 3.1 (L) 3.5 - 5.1 mmol/L   Chloride 105 98 - 111 mmol/L   CO2 22 22 - 32 mmol/L   Glucose, Bld 83 70 - 99  mg/dL    Comment: Glucose reference range applies only to samples taken after fasting for at least 8 hours.   BUN 22 8 - 23 mg/dL   Creatinine, Ser 9.09 0.44 - 1.00 mg/dL   Calcium  8.9 8.9 - 10.3 mg/dL   Total Protein 6.7 6.5 - 8.1 g/dL   Albumin  2.4 (L) 3.5 - 5.0 g/dL   AST 31 15 - 41 U/L   ALT 46 (H) 0 - 44 U/L   Alkaline Phosphatase 128 (H) 38 - 126 U/L   Total Bilirubin 0.3 0.0 - 1.2 mg/dL   GFR, Estimated >39 >39 mL/min    Comment: (NOTE) Calculated using the CKD-EPI Creatinine Equation (2021)    Anion gap 13 5 - 15    Comment: Performed at [facility], [address], [place], [place] [address]  Lipase, blood     Status: None   Collection Time: 08/27/24  6:21 PM  Result Value Ref Range   Lipase 49 11 - 51 U/L    Comment: Performed at [facility], [address], [place], [place] [address]  Magnesium      Status: None   Collection Time: 08/27/24  6:21 PM  Result Value Ref Range   Magnesium  2.1 1.7 - 2.4 mg/dL    Comment: Performed at [facility], [address] [place], [place] [address]  Urinalysis, w/ Reflex to Culture (Infection Suspected) -Urine, Clean Catch     Status: Abnormal   Collection Time: 08/27/24  9:53 PM  Result Value Ref Range   Specimen Source URINE, CLEAN CATCH    Color, Urine YELLOW YELLOW   APPearance HAZY (A) CLEAR   Specific Gravity, Urine 1.048 (H) 1.005 - 1.030   pH 5.0 5.0 - 8.0   Glucose, UA NEGATIVE NEGATIVE mg/dL   Hgb urine dipstick LARGE (A) NEGATIVE   Bilirubin Urine NEGATIVE NEGATIVE    Ketones, ur NEGATIVE NEGATIVE mg/dL   Protein, ur NEGATIVE NEGATIVE mg/dL   Nitrite NEGATIVE NEGATIVE   Leukocytes,Ua NEGATIVE NEGATIVE   RBC / HPF >50 0 - 5 RBC/hpf   WBC, UA 0-5 0 - 5 WBC/hpf    Comment:        Reflex urine culture not performed if WBC <=10, OR if Squamous epithelial cells >5. If Squamous epithelial cells >5 suggest recollection.    Bacteria, UA RARE (A) NONE SEEN   Squamous Epithelial / HPF 0-5 0 - 5 /HPF    Comment: Performed at [facility], [address] [place], [place] [address]  Urine Drug Screen     Status: Abnormal   Collection Time: 08/28/24 12:05 AM  Result Value Ref Range   Opiates POSITIVE (A) NEGATIVE   Cocaine NEGATIVE NEGATIVE   Benzodiazepines NEGATIVE NEGATIVE   Amphetamines NEGATIVE NEGATIVE   Tetrahydrocannabinol NEGATIVE NEGATIVE   Barbiturates NEGATIVE NEGATIVE   Methadone  Scn, Ur NEGATIVE NEGATIVE   Fentanyl  NEGATIVE NEGATIVE    Comment: (NOTE) Drug screen is for Medical Purposes only. Positive results are preliminary only. If confirmation is needed, notify lab within 5 days.  Drug Class                 Cutoff (ng/mL) Amphetamine and metabolites 1000 Barbiturate and metabolites 200 Benzodiazepine              200 Opiates and metabolites     300 Cocaine and metabolites     300 THC  50 Fentanyl                     5 Methadone                    300  Trazodone  is metabolized in vivo to several metabolites,  including pharmacologically active m-CPP, which is excreted in the  urine.  Immunoassay screens for amphetamines and MDMA have potential  cross-reactivity with these compounds and may provide false positive  result.  Performed at [facility], [address] [place], [place] [address]   Legionella Pneumophila Serogp 1 Ur Ag     Status: None   Collection Time: 08/28/24 12:05 AM  Result Value Ref Range   L. pneumophila Serogp 1 Ur Ag Negative Negative    Comment: (NOTE) Presumptive  negative for L. pneumophila serogroup 1 antigen in urine, suggesting no recent or current infection. Legionnaires' disease cannot be ruled out since other serogroups and species may also cause disease. Performed At: [facility] [address] [place], [place] [id number] Jennette Shorter MD Ey:[phone]    Source of Sample URINE, RANDOM     Comment: Performed at [facility], [address], [place], [place] [address]  Strep pneumoniae urinary antigen     Status: None   Collection Time: 08/28/24 12:06 AM  Result Value Ref Range   Strep Pneumo Urinary Antigen NEGATIVE NEGATIVE    Comment:        Infection due to S. pneumoniae cannot be absolutely ruled out since the antigen present may be below the detection limit of the test. Performed at [facility], [address] [place], [place] [address]   Prepare RBC (crossmatch)     Status: None   Collection Time: 08/28/24 12:16 AM  Result Value Ref Range   Order Confirmation      ORDER PROCESSED BY BLOOD BANK Performed at [facility], [address], [place], [place] [address]   Culture, blood (Routine X 2) w Reflex to ID Panel     Status: None   Collection Time: 08/28/24  1:09 AM   Specimen: BLOOD  Result Value Ref Range   Specimen Description BLOOD SITE NOT SPECIFIED    Special Requests      BOTTLES DRAWN AEROBIC AND ANAEROBIC Blood Culture results may not be optimal due to an inadequate volume of blood received in culture bottles   Culture      NO GROWTH 5 DAYS Performed at [facility], [address], [place], [place] [address]    Report Status 09/02/2024 FINAL   Comprehensive metabolic panel     Status: Abnormal   Collection Time: 08/28/24  5:00 AM  Result Value Ref Range   Sodium 139 135 - 145 mmol/L   Potassium 3.8 3.5 - 5.1 mmol/L    Comment: Delta check noted    Chloride 107 98 - 111 mmol/L   CO2 21 (L) 22 - 32 mmol/L   Glucose, Bld 78 70 - 99 mg/dL    Comment: Glucose reference  range applies only to samples taken after fasting for at least 8 hours.   BUN 14 8 - 23 mg/dL   Creatinine, Ser 9.23 0.44 - 1.00 mg/dL   Calcium  8.8 (L) 8.9 - 10.3 mg/dL   Total Protein 7.0 6.5 - 8.1 g/dL   Albumin  2.5 (L) 3.5 - 5.0 g/dL   AST 32 15 - 41 U/L   ALT 42 0 -  44 U/L   Alkaline Phosphatase 129 (H) 38 - 126 U/L   Total Bilirubin 0.3 0.0 - 1.2 mg/dL   GFR, Estimated >39 >39 mL/min    Comment: (NOTE) Calculated using the CKD-EPI Creatinine Equation (2021)    Anion gap 11 5 - 15    Comment: Performed at [facility], [address] [place], [place] [address]  CBC     Status: Abnormal   Collection Time: 08/28/24  5:00 AM  Result Value Ref Range   WBC 9.8 4.0 - 10.5 K/uL   RBC 3.51 (L) 3.87 - 5.11 MIL/uL   Hemoglobin 10.2 (L) 12.0 - 15.0 g/dL   HCT 67.5 (L) 63.9 - 53.9 %   MCV 92.3 80.0 - 100.0 fL   MCH 29.1 26.0 - 34.0 pg   MCHC 31.5 30.0 - 36.0 g/dL   RDW 84.6 88.4 - 84.4 %   Platelets 647 (H) 150 - 400 K/uL   nRBC 0.2 0.0 - 0.2 %    Comment: Performed at [facility], [address] [place], [place] [address]  Body fluid culture w Gram Stain     Status: None   Collection Time: 08/28/24  7:44 AM   Specimen: Pleura; Body Fluid  Result Value Ref Range   Specimen Description PLEURAL    Special Requests NONE    Gram Stain RARE WBC SEEN NO ORGANISMS SEEN     Culture      NO GROWTH 3 DAYS Performed at [facility], [address], [place], [place] [address]    Report Status 08/31/2024 FINAL   Protime-INR     Status: Abnormal   Collection Time: 08/28/24  8:48 AM  Result Value Ref Range   Prothrombin Time 15.7 (H) 11.4 - 15.2 seconds   INR 1.2 0.8 - 1.2    Comment: (NOTE) INR goal varies based on device and disease states. Performed at [facility], [address] [place], [place] [address]   Cytology - Non PAP;     Status: None   Collection Time: 08/28/24 10:30 AM  Result Value Ref Range   CYTOLOGY - NON GYN      CYTOLOGY - NON  PAP CASE: [id number] PATIENT: Karima Wethington Non-Gynecological Cytology Report     Clinical History: Right pleural effusion. Melena. Hepatic abscess. Gastric fistula. Mixed hyperlipidemia. Specimen Submitted:  A. PLEURAL FLUID, RIGHT, THORACENTESIS:   FINAL MICROSCOPIC DIAGNOSIS: - No malignant cells identified - Erythrocytes and mixed inflammatory cells  SPECIMEN ADEQUACY: Satisfactory for evaluation  GROSS: Received is/are 800cc's of hazy, amber fluid. (EMH:emh) Smears:  0 Concentration Method (Thin Prep):  1 Cell Block:  1 conventional. Additional Studies: Also received are 2 hematology slides labeled [id number].     Final Diagnosis performed by Rexene Daily, MD.   Electronically signed 08/30/2024 Technical component performed at [facility]. [facility], [address], [place], [place] [address].  Professional component performed at [facility]. [address], [place], [place] [address]  Immunohist Technical Sales Engineer component (if applicable) was performed at [employer]. [address], [place], [place] [address].  IMMUNOHISTOCHEMISTRY DISCLAIMER (if applicable): Some of these immunohistochemical stains may have been developed and the performance characteristics determine by [facility]. Some may not have been cleared or approved by the U.S. Food and Drug Administration. The FDA has determined that such clearance or approval is not necessary. This test is used for clinical purposes. It should not  be regarded as investigational or for research. This laboratory is certified under the Clinical Laboratory Improvement Amendments of 1988 (CLIA-88) as qualified to perform high complexity clinical laboratory testing.  The controls stained appropriately.   Body fluid cell count with differential     Status: Abnormal   Collection Time: 08/28/24 11:39 AM  Result Value Ref Range   Fluid Type-FCT  PLEURAL FLUID RIGHT     Comment: CORRECTED ON 01/18 AT 1200: PREVIOUSLY REPORTED AS CYTO PLEU   Color, Fluid PINK (A) YELLOW   Appearance, Fluid CLOUDY (A) CLEAR   Total Nucleated Cell Count, Fluid 2,267 (H) 0 - 1,000 cu mm   Neutrophil Count, Fluid 19 0 - 25 %   Lymphs, Fluid 77 %   Monocyte-Macrophage-Serous Fluid 2 (L) 50 - 90 %   Eos, Fluid 2 %   Other Cells, Fluid OTHER CELLS IDENTIFIED AS MESOTHELIAL CELLS %    Comment: Performed at [facility], [address], [place], [place] [address]  Protein, body fluid (other)     Status: None   Collection Time: 08/28/24 12:06 PM  Result Value Ref Range   Total Protein, Body Fluid Other 3.9 g/dL    Comment: (NOTE)             _________________________________________            : BODY FLUID TYPE :     TOTAL PROTEIN     :            :_________________:_______________________:            : Amniotic Fluid  :               <0.4    :            :_________________:_______________________:            :                 : Nonmalignant: <3.0    :            : Ascitic Fluid   : Malignant:    >3.0    :            :_________________:_______________________:            : Bile, Clear     :               <0.9    :            :_________________:_______________________:            : Bile, Yellow    :          0.2 - 0.6    :            :_________________:_______________________:            : Lymph           :          2.2 - 6.0    :            :_________________:_______________________:            : Human Milk      :          1.9 - 2.0    :            :_________________: ______________________:            : Nasal Secretion :  0.1 - 3.5    :            :_________________:___________ ____________:            : Pancreatic      :          0.0 - 0.1    :            : Juice           :    (post stimulation) :            :_________________:_______________________:            :                 : Transudate:   <3.0    :            : Pleural Fluid    : Exudate:      >3.0    :            :_________________:_______________________:            : Saliva          :          0.1 - 0.2    :            : (Mixed Glands)  :                       :            :_________________:_______________________:            : Synovial Fluid  :               <2.5    :            :_________________:_______________________:            : Tears           :          0.8 - 0.9    :            :_________________:_______________________:             Bronwen ORN, Gwynn [place] Reference Intervals             for  Adults and Children 2008. Ninth             Edition (V9.1) [employer],             [place]; [place]: July 2009. Performed At: [facility] [address] [place], [place] [id number] Jennette Shorter MD Ey:[phone]    Source of Sample PLEURAL FLUID RIGHT     Comment: Performed at [facility], [address], [place], [place] [address]  LD, Body Fluid (other)     Status: None   Collection Time: 08/28/24 12:06 PM  Result Value Ref Range   Source of Sample PLEURAL FLUID RIGHT     Comment: Performed at [facility], [address] [place], [place] [address]   LD, Body Fluid 216 IU/L    Comment: (NOTE)             _________________________________________            : BODY FLUID TYPE :          LDH          :            :_________________:_______________________:            :                 :  Nonmalignant:  < 60%  :            :                 :  of the serum LDH     :            : Ascitic Fluid   : Malignant:     > 60%  :            :                 :  of the serum LDH     :            :_________________:_______________________:            : Gastric Juice   :                < 35   :            :_________________:_______________________:            :                 : Transudate:    <200   :            : Pleural Fluid   : Exudate:       >200   :            :_________________:_______________________:            :  Saliva          :           113 - 609   :            : (Mixed Glands)  :                       :            :_________________:_______________________:            : Synovial Fluid  :                <240   :            :_________________:___________ ____________:             Bronwen ORN, Gwynn GAILS. Reference Intervals             for Adults and Children 2008. Ninth             Edition (V9.1) [employer],             [place]; [place]: July 2009. Performed At: [facility] [address] [place], [place] [id number] Jennette Shorter MD Ey:[phone]   Glucose, Body Fluid Other     Status: None   Collection Time: 08/28/24 12:07 PM  Result Value Ref Range   Glucose, Body Fluid Other 92 mg/dL    Comment: (NOTE)             _________________________________________            : BODY FLUID TYPE :        GLUCOSE        :            :_________________:_______________________:            : Amniotic Fluid  :        45 - 76        :            :  _________________:_______________________:            : Bile, Clear     :            < 5        :            :_________________:_______________________:            : Bile, Yellow    :            < 8        :            :_________________:_______________________:            : Lymph           :        48 - 200       :            :_________________:_______________________:            : Nasal Secretion :            < 10       :            :_________________:_______________________:            : Pleural Fluid   :        65 -  99       :            :_________________:_______________________:            : Saliva          :            <  2       :            : (Mixed Glands)  :                       :            :_________________:___________ ____________:            : Sweat           :            <  7       :            :_________________:_______________________:            : Synovial Fluid  :        65 -  99       :             :_________________:_______________________:            : Tears           :        76 - 288       :            :_________________:_______________________:             Bronwen ORN, Ehrhardt V. Reference Intervals             for Adults and Children 2008. Ninth             edition (V9.1) [employer],             [place]; [place]: July 2009. Performed At: [facility] [address] [place], [place] [id number] Jennette Shorter MD Ey:[phone]    Source of Sample PLEURAL FLUID RIGHT  Comment: Performed at [facility], [address], [place], [place] [address]  Culture, blood (Routine X 2) w Reflex to ID Panel     Status: None   Collection Time: 08/28/24 12:41 PM   Specimen: BLOOD RIGHT HAND  Result Value Ref Range   Specimen Description BLOOD RIGHT HAND    Special Requests      BOTTLES DRAWN AEROBIC ONLY Blood Culture results may not be optimal due to an inadequate volume of blood received in culture bottles   Culture      NO GROWTH 5 DAYS Performed at [facility], [address], [place], [place] [address]    Report Status 09/02/2024 FINAL   Aerobic/Anaerobic Culture w Gram Stain (surgical/deep wound)     Status: None   Collection Time: 08/28/24  3:57 PM   Specimen: Abscess  Result Value Ref Range   Specimen Description ABSCESS    Special Requests ABDOMEN    Gram Stain      MODERATE WBC PRESENT, PREDOMINANTLY PMN ABUNDANT GRAM POSITIVE COCCI MODERATE GRAM NEGATIVE RODS ABUNDANT GRAM POSITIVE RODS RARE YEAST    Culture      RARE LACTOBACILLUS SPECIES Standardized susceptibility testing for this organism is not available. NO ANAEROBES ISOLATED Performed at [facility], [address], [place], [place] [address]    Report Status 09/02/2024 FINAL   Triglycerides     Status: None   Collection Time: 08/29/24  3:39 AM  Result Value Ref Range   Triglycerides 73 <150 mg/dL    Comment: Performed at [facility], [address]  [place], [place] [address]  Basic metabolic panel with GFR     Status: Abnormal   Collection Time: 08/29/24  3:39 AM  Result Value Ref Range   Sodium 138 135 - 145 mmol/L   Potassium 3.8 3.5 - 5.1 mmol/L   Chloride 105 98 - 111 mmol/L   CO2 20 (L) 22 - 32 mmol/L   Glucose, Bld 90 70 - 99 mg/dL    Comment: Glucose reference range applies only to samples taken after fasting for at least 8 hours.   BUN 12 8 - 23 mg/dL   Creatinine, Ser 9.24 0.44 - 1.00 mg/dL   Calcium  8.6 (L) 8.9 - 10.3 mg/dL   GFR, Estimated >39 >39 mL/min    Comment: (NOTE) Calculated using the CKD-EPI Creatinine Equation (2021)    Anion gap 12 5 - 15    Comment: Performed at [facility], [address], [place], [place] [address]  CBC with Differential/Platelet     Status: Abnormal   Collection Time: 08/29/24  3:39 AM  Result Value Ref Range   WBC 8.1 4.0 - 10.5 K/uL   RBC 3.19 (L) 3.87 - 5.11 MIL/uL   Hemoglobin 9.3 (L) 12.0 - 15.0 g/dL   HCT 71.4 (L) 63.9 - 53.9 %   MCV 89.3 80.0 - 100.0 fL   MCH 29.2 26.0 - 34.0 pg   MCHC 32.6 30.0 - 36.0 g/dL   RDW 84.4 88.4 - 84.4 %   Platelets 601 (H) 150 - 400 K/uL   nRBC 0.4 (H) 0.0 - 0.2 %   Neutrophils Relative % 71 %   Neutro Abs 5.8 1.7 - 7.7 K/uL   Lymphocytes Relative 17 %   Lymphs Abs 1.4 0.7 - 4.0 K/uL   Monocytes Relative 10 %   Monocytes Absolute 0.8 0.1 - 1.0 K/uL   Eosinophils Relative 1 %  Eosinophils Absolute 0.1 0.0 - 0.5 K/uL   Basophils Relative 0 %   Basophils Absolute 0.0 0.0 - 0.1 K/uL   WBC Morphology See Note     Comment: INCREASED BANDS (>20% BANDS)   RBC Morphology MORPHOLOGY UNREMARKABLE    Smear Review Normal platelet morphology    Immature Granulocytes 1 %   Abs Immature Granulocytes 0.07 0.00 - 0.07 K/uL    Comment: Performed at [facility], [address] [place], [place] [address]  Magnesium      Status: None   Collection Time: 08/29/24  3:39 AM  Result Value Ref Range   Magnesium  2.0 1.7 - 2.4 mg/dL    Comment:  Performed at [facility], [address] [place], [place] [address]  Phosphorus     Status: None   Collection Time: 08/29/24  3:39 AM  Result Value Ref Range   Phosphorus 3.0 2.5 - 4.6 mg/dL    Comment: Performed at [facility], [address], [place], [place] [address]  FISH HES [phone] REA     Status: None   Collection Time: 08/29/24  3:39 AM  Result Value Ref Range   Specimen Type Comment:     Comment: BLOOD   Cells Counted: Comment:     Comment: 200/PROBE   Cells Analyzed Comment:     Comment: 200/PROBE   FISH Result Comment:     Comment: NORMAL CLL PANEL   Interpretation: Comment:     Comment: (NOTE)    The CLL interphase fluorescence in situ hybridization (FISH) panel analysis was normal. There were no cells with CCND1-IGH fusion. No extra signals or deletions of ATM, chromosome 12, 13q, or TP53 were observed.  SPECIFIC FISH RESULTS:    CCND1/IGH: NORMAL             nuc ish 11q13(CCND1x2),14q32(IGHx2)[200]    ATM:       NORMAL             nuc ish 11q22.3(ATMx2)[200]    12cen:     NORMAL             nuc ish 12cen(D12Z3x2)[200]    13q:       NORMAL             nuc ish 13q14.3(DLEUx2),13q34(TFDP1x2)[200]    TP53:      NORMAL             nuc ish 17p13.1(TP53x2)[200].      This analysis is limited to abnormalities detectable by the specific probes included in the study. The TargetGene FISH results should be interpreted within the context of a full cytogenetic analysis and hematologic evaluation. The DNA probe vendor for this study was [employer]).    REFERENCES:  Malek,(2013) Adv Exp Med Biol [phone].EFPI#[id number] .  Schnaiter et al.,( 2011) Clin Lab Med [phone].EFPI#[id number]         This test was developed and its performace characteristics determined by [facility] of [address] [employer]). It has not been cleared or approved by the U.S. Food and Drug Administration.    Director Review: Comment:      Comment: (NOTE) Lynwood DEL. Jenni, PhD, [facility], Professional Component performed by, [employer] [facility] [place], [place] [id number], [address], [place], [place] [address]. Medical Director, Aniceto Christen, MD,PhD Performed At: [facility] [place] [address] [place], [place] [id number] Christen Aniceto MDPhD Ey:[phone]   CBG monitoring, ED     Status: Abnormal   Collection  Time: 08/29/24 12:09 PM  Result Value Ref Range   Glucose-Capillary 113 (H) 70 - 99 mg/dL    Comment: Glucose reference range applies only to samples taken after fasting for at least 8 hours.  Glucose, capillary     Status: Abnormal   Collection Time: 08/29/24  5:13 PM  Result Value Ref Range   Glucose-Capillary 112 (H) 70 - 99 mg/dL    Comment: Glucose reference range applies only to samples taken after fasting for at least 8 hours.  Basic metabolic panel with GFR     Status: Abnormal   Collection Time: 08/30/24  2:17 AM  Result Value Ref Range   Sodium 138 135 - 145 mmol/L   Potassium 4.1 3.5 - 5.1 mmol/L    Comment: HEMOLYSIS AT THIS LEVEL MAY AFFECT RESULT   Chloride 106 98 - 111 mmol/L   CO2 23 22 - 32 mmol/L   Glucose, Bld 92 70 - 99 mg/dL    Comment: Glucose reference range applies only to samples taken after fasting for at least 8 hours.   BUN 7 (L) 8 - 23 mg/dL   Creatinine, Ser 9.28 0.44 - 1.00 mg/dL   Calcium  8.8 (L) 8.9 - 10.3 mg/dL   GFR, Estimated >39 >39 mL/min    Comment: (NOTE) Calculated using the CKD-EPI Creatinine Equation (2021)    Anion gap 9 5 - 15    Comment: Performed at [facility], [address] [place], [place] [address]  CBC with Differential/Platelet     Status: Abnormal   Collection Time: 08/30/24  2:17 AM  Result Value Ref Range   WBC 5.9 4.0 - 10.5 K/uL   RBC 3.09 (L) 3.87 - 5.11 MIL/uL   Hemoglobin 8.9 (L) 12.0 - 15.0 g/dL   HCT 72.4 (L) 63.9 - 53.9 %   MCV 89.0 80.0 - 100.0 fL   MCH 28.8 26.0 - 34.0 pg   MCHC 32.4 30.0 - 36.0 g/dL   RDW 84.3 (H)  88.4 - 15.5 %   Platelets 572 (H) 150 - 400 K/uL   nRBC 0.0 0.0 - 0.2 %   Neutrophils Relative % 60 %   Neutro Abs 3.6 1.7 - 7.7 K/uL   Lymphocytes Relative 23 %   Lymphs Abs 1.3 0.7 - 4.0 K/uL   Monocytes Relative 14 %   Monocytes Absolute 0.8 0.1 - 1.0 K/uL   Eosinophils Relative 1 %   Eosinophils Absolute 0.0 0.0 - 0.5 K/uL   Basophils Relative 1 %   Basophils Absolute 0.0 0.0 - 0.1 K/uL   Immature Granulocytes 1 %   Abs Immature Granulocytes 0.07 0.00 - 0.07 K/uL    Comment: Performed at [facility], [address], [place], [place] [address]  CBC     Status: Abnormal   Collection Time: 08/31/24  3:50 AM  Result Value Ref Range   WBC 5.0 4.0 - 10.5 K/uL   RBC 3.14 (L) 3.87 - 5.11 MIL/uL   Hemoglobin 9.0 (L) 12.0 - 15.0 g/dL   HCT 71.8 (L) 63.9 - 53.9 %   MCV 89.5 80.0 - 100.0 fL   MCH 28.7 26.0 - 34.0 pg   MCHC 32.0 30.0 - 36.0 g/dL   RDW 84.6 88.4 - 84.4 %   Platelets 593 (H) 150 - 400 K/uL   nRBC 0.4 (H) 0.0 - 0.2 %    Comment: Performed at [facility], [address] [place], [place] [address]  Comprehensive metabolic panel  with GFR     Status: Abnormal   Collection Time: 08/31/24  3:50 AM  Result Value Ref Range   Sodium 140 135 - 145 mmol/L   Potassium 3.8 3.5 - 5.1 mmol/L   Chloride 107 98 - 111 mmol/L   CO2 26 22 - 32 mmol/L   Glucose, Bld 90 70 - 99 mg/dL    Comment: Glucose reference range applies only to samples taken after fasting for at least 8 hours.   BUN 6 (L) 8 - 23 mg/dL   Creatinine, Ser 9.34 0.44 - 1.00 mg/dL   Calcium  8.8 (L) 8.9 - 10.3 mg/dL   Total Protein 6.4 (L) 6.5 - 8.1 g/dL   Albumin  2.3 (L) 3.5 - 5.0 g/dL   AST 20 15 - 41 U/L   ALT 27 0 - 44 U/L   Alkaline Phosphatase 101 38 - 126 U/L   Total Bilirubin 0.3 0.0 - 1.2 mg/dL   GFR, Estimated >39 >39 mL/min    Comment: (NOTE) Calculated using the CKD-EPI Creatinine Equation (2021)    Anion gap 8 5 - 15    Comment: Performed at [facility], [address]  [place], [place] [address]  Glucose, capillary     Status: None   Collection Time: 08/31/24  7:56 AM  Result Value Ref Range   Glucose-Capillary 97 70 - 99 mg/dL    Comment: Glucose reference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

## 2024-09-10 DIAGNOSIS — F3111 Bipolar disorder, current episode manic without psychotic features, mild: Secondary | ICD-10-CM | POA: Diagnosis present

## 2024-09-10 DIAGNOSIS — K922 Gastrointestinal hemorrhage, unspecified: Secondary | ICD-10-CM | POA: Diagnosis not present

## 2024-09-10 LAB — BASIC METABOLIC PANEL WITH GFR
Anion gap: 9 (ref 5–15)
BUN: 6 mg/dL — ABNORMAL LOW (ref 8–23)
CO2: 26 mmol/L (ref 22–32)
Calcium: 8.7 mg/dL — ABNORMAL LOW (ref 8.9–10.3)
Chloride: 106 mmol/L (ref 98–111)
Creatinine, Ser: 0.68 mg/dL (ref 0.44–1.00)
GFR, Estimated: >60 mL/min
Glucose, Bld: 107 mg/dL — ABNORMAL HIGH (ref 70–99)
Potassium: 3.7 mmol/L (ref 3.5–5.1)
Sodium: 142 mmol/L (ref 135–145)

## 2024-09-10 LAB — GLUCOSE, CAPILLARY: Glucose-Capillary: 118 mg/dL — ABNORMAL HIGH (ref 70–99)

## 2024-09-10 LAB — MAGNESIUM: Magnesium: 2 mg/dL (ref 1.7–2.4)

## 2024-09-10 LAB — PHOSPHORUS: Phosphorus: 2.1 mg/dL — ABNORMAL LOW (ref 2.5–4.6)

## 2024-09-10 MED ORDER — POTASSIUM PHOSPHATES 15 MMOLE/5ML IV SOLN
30.0000 mmol | Freq: Once | INTRAVENOUS | Status: AC
  Administered 2024-09-10: 30 mmol via INTRAVENOUS
  Filled 2024-09-10: qty 10

## 2024-09-10 MED ORDER — TRAMADOL HCL 50 MG PO TABS
50.0000 mg | ORAL_TABLET | Freq: Two times a day (BID) | ORAL | Status: DC
  Administered 2024-09-10 – 2024-09-11 (×4): 50 mg via ORAL
  Filled 2024-09-10 (×5): qty 1

## 2024-09-10 MED ORDER — TRAVASOL 10 % IV SOLN
INTRAVENOUS | Status: AC
  Filled 2024-09-10: qty 1044

## 2024-09-10 MED ORDER — THIAMINE HCL 100 MG/ML IJ SOLN: 100.0000 mg | Freq: Every day | INTRAMUSCULAR | Status: DC

## 2024-09-10 NOTE — Progress Notes (Addendum)
 Psychiatric Nurse Liaison Visit Note  Visit Type: Rounding   Situation: (Reason for Visit) Proactively rounding on patient.   Background: Per chart review patient carries the psych diagnoses of bipolar disorder.  Assessment: Pt refused to talk to psychiatric nurse liaison.   Recommendation: No new recommendations, seen by psych provider.   Response: NA  Time Spent with Patient: 1 min

## 2024-09-10 NOTE — Progress Notes (Addendum)
 "    Subjective/Chief Complaint: Patient pleasant this morning Reports mild RUQ pain   Objective: Vital signs in last 24 hours: Temp:  [98.5 F (36.9 C)-99.1 F (37.3 C)] 99.1 F (37.3 C) (01/30 2039) Pulse Rate:  [80] 80 (01/30 1045) Resp:  [18] 18 (01/30 1045) BP: (122-141)/(81-95) 141/95 (01/31 0452) SpO2:  [96 %] 96 % (01/30 1045) Last BM Date : 09/09/24  Intake/Output from previous day: 01/30 0701 - 01/31 0700 In: 1195.6 [P.O.:840; I.V.:335.6] Out: 50 [Drains:50] Intake/Output this shift: No intake/output data recorded.  Exam: Awake and alert Calm, in NAD Abdomen soft, drain in RUQ in place, 50 cc out last 24 hrs, purulent No peritonitis  Lab Results:  Recent Labs    09/09/24 0619  WBC 5.8  HGB 8.8*  HCT 27.3*  PLT 450*   BMET Recent Labs    09/09/24 0619 09/10/24 0356  NA 138 142  K 3.9 3.7  CL 104 106  CO2 24 26  GLUCOSE 83 107*  BUN 6* 6*  CREATININE 0.67 0.68  CALCIUM  8.6* 8.7*   PT/INR No results for input(s): LABPROT, INR in the last 72 hours. ABG No results for input(s): PHART, HCO3 in the last 72 hours.  Invalid input(s): PCO2, PO2  Studies/Results: US  EKG SITE RITE Result Date: 09/09/2024 If Site Rite image not attached, placement could not be confirmed due to current cardiac rhythm.   Anti-infectives: Anti-infectives (From admission, onward)    Start     Dose/Rate Route Frequency Ordered Stop   08/30/24 1245  Ampicillin -Sulbactam (UNASYN ) 3 g in sodium chloride  0.9 % 100 mL IVPB        3 g 200 mL/hr over 30 Minutes Intravenous Every 6 hours 08/30/24 1151     08/29/24 0815  micafungin  (MYCAMINE ) 100 mg in sodium chloride  0.9 % 100 mL IVPB        100 mg 105 mL/hr over 1 Hours Intravenous Every 24 hours 08/29/24 0807     08/28/24 1200  vancomycin  (VANCOREADY) IVPB 750 mg/150 mL  Status:  Discontinued        750 mg 150 mL/hr over 60 Minutes Intravenous Every 12 hours 08/27/24 2352 08/29/24 0926   08/28/24 1000   fluconazole  (DIFLUCAN ) tablet 400 mg        400 mg Oral Daily 08/27/24 2351 08/28/24 0928   08/28/24 0400  piperacillin -tazobactam (ZOSYN ) IVPB 3.375 g  Status:  Discontinued        3.375 g 12.5 mL/hr over 240 Minutes Intravenous Every 8 hours 08/27/24 2352 08/30/24 1150   08/27/24 2345  vancomycin  (VANCOREADY) IVPB 1750 mg/350 mL        1,750 mg 175 mL/hr over 120 Minutes Intravenous  Once 08/27/24 2343 08/28/24 0706   08/27/24 2245  piperacillin -tazobactam (ZOSYN ) IVPB 3.375 g        3.375 g 100 mL/hr over 30 Minutes Intravenous  Once 08/27/24 2239 08/27/24 2325       Assessment/Plan: 64 yo female with a known gastric antral ulcer, presenting with acute abdominal pain and a large upper abdominal abscess, consistent with a contained perforation. S/p percutaneous drain placement 1/18. -IR with upsize of drain 1/26, cont to monitor -fevers currently improved, AF for last 24 hrs.  On unasyn  and micafungin . - CT confirms suspicion of gastric perforation contained within this abscess cavity now.  Her nutrition is poor.  Will plan to place a PICC and start TNA.  -Again, likely operation next week -  WBC still normal, HR is  normal as is BP - CLD for now is ok - Abx per ID.   - Continue PPI and antibiotics  -I have reviewed all notes and am in complete agreement with Dr. Dennise regarding her mental state and the need for IVC  Moderately complex medical decision making   Vicenta Poli 09/10/2024  "

## 2024-09-10 NOTE — Plan of Care (Signed)
" °  Problem: Education: Goal: Knowledge of General Education information will improve Description: Including pain rating scale, medication(s)/side effects and non-pharmacologic comfort measures Outcome: Progressing   Problem: Health Behavior/Discharge Planning: Goal: Ability to manage health-related needs will improve Outcome: Progressing   Problem: Clinical Measurements: Goal: Ability to maintain clinical measurements within normal limits will improve Outcome: Progressing Goal: Will remain free from infection Outcome: Not Progressing Goal: Diagnostic test results will improve Outcome: Progressing Goal: Respiratory complications will improve Outcome: Progressing Goal: Cardiovascular complication will be avoided Outcome: Progressing   Problem: Activity: Goal: Risk for activity intolerance will decrease Outcome: Progressing   "

## 2024-09-10 NOTE — Progress Notes (Signed)
 PHARMACY - TOTAL PARENTERAL NUTRITION CONSULT NOTE   Indication: Gastric fistula  Patient Measurements: Height: 5' 4 (162.6 cm) Weight: 81.6 kg (180 lb) IBW/kg (Calculated) : 54.7 TPN AdjBW (KG): 61.4 Body mass index is 30.9 kg/m. Usual Weight: 180-182 lbs  Assessment:  64 yo woman with admitted on 1/2 and found to have gastric ulcer, gastritis and esophageal candidiasis on EGD. Patient left AMA 1/3 due to child care and returned same day with continued abdominal pain and dark stools. Patient developed AKI and was eventually discharged home 1/12. Patient called EMS on 1/17 for black tarry stools, vomiting and weakness. CT found large perihepatic abscess secondary to fistulous connection from ruptured gastric ulcer, which was been persistent despite drain placement and drain upsizing. 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 Patient tolerated dysphagia 3 diet well and likes Boost Breeze supplements. Patient was NPO again 1/28 for CT.  On 1/28 Patient reports she ate broccoli last night and this morning. A few bits of broccoli florets are present in abscess drain bag. Pharmacy consulted for TPN.  1/30 Pt refused PICC line 1/29 so she did not get TPN but agreeable today to receive. Pending surgery next week. Surgery approved clear liquids for now.   Glucose / Insulin : A1C 5.7. BG < 110, used 0 units sSSI/24hr Electrolytes: K 3.7, CoCa 9.8, phos 2.1, others wnl Renal: Scr 0.68, BUN 6 Hepatic: Alk phos/AST/ALT/tbili wnl, albumin  2.5, TG 73 1/19  Intake / Output; MIVF: UOP not charted, drain 50 ml GI Imaging: 1/17 CT: large R hepatic subcapsular abscess with fistulous communication to gastric antrum  1/22 UGI: esophageal stasis/achalasia, delayed gastric emptying, 1/25 CT: subcapsular abscess in anterior liver, drain in collection 1/27 KUB: Paucity of bowel gas throughout the abdomen  1/28 CT:  persistent subcapsular fluid abscess, unchanged  GI Surgeries / Procedures:  1/18 R perihepatic abscess drain placement  1/26 drain upsize  Central access: PICC 1/30 TPN start date: 1/30  Nutritional Goals: Goal TPN rate is 75 mL/hr (provides 104 g of protein and 1753 kcals per day)  RD Assessment: Estimated Needs Total Energy Estimated Needs: 1700-1900 Total Protein Estimated Needs: 90-105 grams Total Fluid Estimated Needs: 1.7-1.9 L  Current Nutrition:  Clear liquid diet + TPN   Plan:  Advance TPN to goal 75 mL/hr at 1800, provides 100% estimated needs   Electrolytes in TPN: Na 100 mEq/L, increase K 35 mEq/L, increase Ca 2 mEq/L, increase Mg 43mEq/L, increase Phos 18 mmol/L. Cl:Ac 1:1 Give Kphos 30 mmol IV x1  Add standard MVI and trace elements to TPN Continue Sensitive q6h SSI and stop if not needed   Thiamine  100mg  x3d  Monitor TPN labs daily until stable at goal then on Mon/Thurs   Jinnie Door, PharmD, BCPS, [facility] Clinical Pharmacist  Please check AMION for all [facility] Pharmacy phone numbers After 10:00 PM, call Main Pharmacy [phone]

## 2024-09-10 NOTE — Progress Notes (Addendum)
 [facility] Progress Note Patient: Jennifer [name] FMW:[id number] DOB: June 26, 1961  DOA: 08/27/2024 DOS: the patient was seen and examined on 09/10/2024  Brief Summary: Patient is a 64 y.o.  female with history of HTN, EtOH use, substance abuse (prior UDS positive for opiates/cocaine/benzos)-who recently had a admission for melena-EGD showed gastric ulcer-patient subsequently left AMA-presented with generalized weakness/melanotic stools/upper abdominal pain-she was found to have right hepatic abscess with fistulous communication to a contained gastric perforation.   Significant events: 1/17>> admit to [facility] 1/22>> underwent upper GI series.  Diet advanced. 1/25>> repeat CT abdomen pelvis with contrast shows persistent abscess. 1/26>> underwent upsizing of her drain with IR.  Developed fever after that.  Blood cultures ordered.  Remains on antibiotics.   Significant studies: 1/17>> CT abdomen/pelvis: Large right subhepatic subcapsular abscess with fistulous communication to the gastric antrum.  New moderate-sized right pleural effusion.  1/22>> upper GI series esophageal distention with severe esophageal dysmotility   Significant microbiology data: 1/18>> blood culture: No growth 1/18>> pleural fluid culture: No growth 1/18>> right hepatic abscess culture: Lactobacillus/rare yeast   Procedures: 1/18>> CT-guided right perihepatic abscess drain placement 1/18>> thoracocentesis by IR 1/29.  Repeat CT scan. 1. Persistent 6.7 x 10.1 cm subcapsular fluid collection in the right liver with air-fluid level, layering density, and a pigtail drainage catheter. No significant decrease in size since previous study. Presence of contrast material in the cavity could indicate communication with the bowel or recent contrast injection of the tube 1/29.  PICC line and TNA requested by general surgery.   Consults: [facility] IR General surgery ID Psychiatry   Assessment and Plan:  Right hepatic  abscess secondary to contained gastric perforation with fistulous communication Contained gastric perforation with fistulous communication with right hepatic abscess S/p CT-guided drain placement by IR on 1/18, upsized on 09/05/2024. Currently on Unasyn  and micafungin  combination Upper GI series shows esophageal distention concerning for achalasia and no evidence of leakage of contrast.  Tolerating soft diet ID, general surgery and IR following, case discussed with ID as well as general surgery again on 09/07/2024.  CT abdomen pelvis with contrast shows persistent abscess.  Her drain was upsized by IR on 09/05/2024. Still has low-grade fevers IR and general surgery following, follow repeat cultures defer management to ID, IR and general surgery. Repeat CT scan noted on 09/08/2024 discussed with general surgery in detail, plan is to place a PICC line, TNA optimize nutrition and then operate early next week. Patient initially declined PICC line but has agreed upon counseling on 09/09/2024 to obtain PICC line, TNA and then undergo surgical correction next week. Note patient has left AMA multiple times before, no family, appears to have poor insight into her illness, multiple refusals on treatments and procedures, will get psych input to evaluate her capacity, may require IVC as well.  Seen by the psychiatry team, they recommend IVC if patient requires continued medical treatment in the hospital setting, she does will IVC her.  Extreme agitation.  Placed on Zyprexa  by psych.  Recent upper GI bleed (EGD on 1/3 showed candidiasis and gastric ulcer) Completed a course of fluconazole  On PPI twice daily-6-8 weeks   Right-sided pleural effusion S/p thoracocentesis on 1/18 Exudative by lights criteria-however cultures negative This is felt to be sympathetic effusion to hepatic abscess On empiric antibiotics as above   HTN BP stable-not on any antihypertensives   EtOH use Apparently has quit drinking-no  withdrawal symptoms   History of substance abuse (UDS positive for benzo/opiates/cocaine  on 1/4) Will counsel the patient prior to discharge   Noncompliance Continues to have poor insight in her medical condition. She has started to leave AMA multiple times. Multiple providers have counseled her again and again regarding the importance of current management. Multiple providers including myself have advised her of the life-threatening and life disabling risk of leaving AGAINST MEDICAL ADVICE.   Class 1 Obesity: Body mass index is 30.9 kg/m.  Placing the patient at high risk for him.   Normocytic acute on chronic blood loss anemia. Hemoglobin appears to be stable around 9.  Acute pain control. Patient reports severe pain in her abdomen. Currently able to tolerate oral soft diet but at the same time reports severe pain in her drain area. Continue current regimen of oxycodone  5 mg and 10 mg with Dilaudid  0.5 mg for refractory pain and muscle relaxer.   DVT Prophylaxis: SCDs Start: 08/27/24 2352 Place TED hose Start: 08/27/24 2352   Data review I have Reviewed nursing notes, Vitals, and Lab results. Since last encounter, pertinent lab results  BMP   . I have ordered test including CBC and BMP  . I have ordered imaging x-ray abdomen  .   Physical exam.  Vitals:   09/09/24 0708 09/09/24 1045 09/09/24 2039 09/10/24 0452  BP: 120/86 122/81 126/81 (!) 141/95  Pulse: 88 80    Resp: 18 18    Temp: 98.2 F (36.8 C) 98.5 F (36.9 C) 99.1 F (37.3 C)   TempSrc: Oral Oral Oral   SpO2: 95% 96%    Weight:      Height:        Awake Alert, No new F.N deficits, Normal affect [place].AT,PERRAL Supple Neck, No JVD,   Symmetrical Chest wall movement, Good air movement bilaterally, CTAB RRR,No Gallops, Rubs or new Murmurs,  +ve B.Sounds, Abd Soft, right upper quadrant tenderness, right sided abdominal drain in place No Cyanosis, Clubbing or edema   Subjective: Patient in bed, appears  comfortable, denies any headache, no fever, no chest pain or pressure, no shortness of breath , improved but still positive right upper quadrant abdominal pain. No new focal weakness.   Family Communication: No one at bedside.  Disposition Plan: Status is: Inpatient Remains inpatient appropriate because: Currently on IV antibiotic.  Monitor for resolution of abscess with eventual drain removal.   Planned Discharge Destination: TBD Diet: Diet Order             Diet clear liquid Fluid consistency: Thin  Diet effective now                    Data Review:   Patient Lines/Drains/Airways Status     Active Line/Drains/Airways     Name Placement date Placement time Site Days   PICC Double Lumen 09/09/24 Right Brachial 37 cm 0 cm 09/09/24  1012  -- 1   Closed System Drain 1 RLQ Bulb (JP) 16 Fr. 09/05/24  1142  RLQ  5             Inpatient Medications  Scheduled Meds:  Chlorhexidine  Gluconate Cloth  6 each Topical Daily   insulin  aspart  0-9 Units Subcutaneous Q6H   OLANZapine   5 mg Oral BID   pantoprazole  (PROTONIX ) IV  40 mg Intravenous Q12H   sodium chloride  flush  10-40 mL Intracatheter Q12H   sodium chloride  flush  10-40 mL Intracatheter Q12H   [START ON 09/11/2024] thiamine  (VITAMIN B1) injection  100 mg Intravenous Daily   traMADol   50 mg Oral Q12H   Continuous Infusions:  ampicillin -sulbactam (UNASYN ) IV 3 g (09/10/24 0616)   micafungin  (MYCAMINE ) 100 mg in sodium chloride  0.9 % 100 mL IVPB 100 mg (09/09/24 0830)   potassium PHOSPHATE  IVPB (in mmol)     TPN ADULT (ION) 35 mL/hr at 09/10/24 0441   TPN ADULT (ION)     PRN Meds:.[DISCONTINUED] acetaminophen  **OR** acetaminophen , HYDROcodone -acetaminophen , HYDROmorphone  (DILAUDID ) injection, LORazepam  **OR** LORazepam , OLANZapine  **OR** OLANZapine , sodium chloride  flush, sodium chloride  flush  DVT Prophylaxis  SCDs Start: 08/27/24 2352 Place TED hose Start: 08/27/24 2352  Recent Labs  Lab 09/04/24 0543  09/05/24 0226 09/07/24 0232 09/09/24 0619  WBC 5.8 5.4 6.1 5.8  HGB 9.6* 8.8* 8.9* 8.8*  HCT 30.4* 26.7* 27.3* 27.3*  PLT 667* 634* 561* 450*  MCV 89.7 86.1 86.7 86.9  MCH 28.3 28.4 28.3 28.0  MCHC 31.6 33.0 32.6 32.2  RDW 14.7 14.6 14.6 15.0  LYMPHSABS  --   --   --  0.9  MONOABS  --   --   --  0.9  EOSABS  --   --   --  0.1  BASOSABS  --   --   --  0.0    Recent Labs  Lab 09/04/24 0543 09/05/24 0226 09/06/24 0241 09/07/24 0232 09/08/24 0242 09/09/24 0619 09/10/24 0356  NA 141 141 142 138 138 138 142  K 4.1 3.6 3.8 4.0 3.9 3.9 3.7  CL 106 105 105 103 103 104 106  CO2 26 27 26 26 24 24 26   ANIONGAP 9 9 12 10 11 10 9   GLUCOSE 76 88 85 81 78 83 107*  BUN <5* <5* <5* <5* 6* 6* 6*  CREATININE 0.73 0.75 0.73 0.84 0.84 0.67 0.68  AST  --   --   --   --  17  --   --   ALT  --   --   --   --  13  --   --   ALKPHOS  --   --   --   --  83  --   --   BILITOT  --   --   --   --  0.4  --   --   ALBUMIN   --   --   --   --  2.5*  --   --   MG 1.7 1.8  --   --  1.8  --  2.0  PHOS 3.0 3.0 2.9 2.8 2.8  --  2.1*  CALCIUM  9.2 9.1 8.8* 8.8* 8.7* 8.6* 8.7*      Recent Labs  Lab 09/04/24 0543 09/05/24 0226 09/06/24 0241 09/07/24 0232 09/08/24 0242 09/09/24 0619 09/10/24 0356  MG 1.7 1.8  --   --  1.8  --  2.0  CALCIUM  9.2 9.1 8.8* 8.8* 8.7* 8.6* 8.7*    --------------------------------------------------------------------------------------------------------------- Lab Results  Component Value Date   CHOL 181 05/24/2024   HDL 68 05/24/2024   LDLCALC 98 05/24/2024   TRIG 73 08/29/2024   CHOLHDL 2.7 05/24/2024    Lab Results  Component Value Date   HGBA1C 5.7 (H) 07/14/2024   No results for input(s): TSH, T4TOTAL, FREET4, T3FREE, THYROIDAB in the last 72 hours. No results for input(s): VITAMINB12, FOLATE, FERRITIN, TIBC, IRON, RETICCTPCT in the last 72  hours. ------------------------------------------------------------------------------------------------------------------ Cardiac Enzymes No results for input(s): CKMB, TROPONINI, MYOGLOBIN in the last 168 hours.  Invalid input(s): CK  Micro Results Recent Results (from the past 240 hours)  Culture,  blood (Routine X 2) w Reflex to ID Panel     Status: None (Preliminary result)   Collection Time: 09/06/24  2:41 AM   Specimen: BLOOD LEFT HAND  Result Value Ref Range Status   Specimen Description BLOOD LEFT HAND  Final   Special Requests   Final    BOTTLES DRAWN AEROBIC AND ANAEROBIC Blood Culture adequate volume   Culture   Final    NO GROWTH 4 DAYS Performed at [facility], [address] [place], [place] [address]    Report Status PENDING  Incomplete  Culture, blood (Routine X 2) w Reflex to ID Panel     Status: None (Preliminary result)   Collection Time: 09/06/24  2:47 AM   Specimen: BLOOD RIGHT HAND  Result Value Ref Range Status   Specimen Description BLOOD RIGHT HAND  Final   Special Requests   Final    BOTTLES DRAWN AEROBIC AND ANAEROBIC Blood Culture results may not be optimal due to an inadequate volume of blood received in culture bottles   Culture   Final    NO GROWTH 4 DAYS Performed at [facility], [address], [place], [place] [address]    Report Status PENDING  Incomplete    Radiology Reports  US  EKG SITE RITE Result Date: 09/09/2024 If Site Rite image not attached, placement could not be confirmed due to current cardiac rhythm.     Signature  -   Lavada Stank M.D on 09/10/2024 at 8:47 AM   -  To page go to [email or web address]

## 2024-09-11 DIAGNOSIS — K922 Gastrointestinal hemorrhage, unspecified: Secondary | ICD-10-CM | POA: Diagnosis not present

## 2024-09-11 LAB — CULTURE, BLOOD (ROUTINE X 2)
Culture: NO GROWTH
Culture: NO GROWTH
Special Requests: ADEQUATE

## 2024-09-11 LAB — CBC WITH DIFFERENTIAL/PLATELET
Basophils Absolute: 0.1 10*3/uL (ref 0.0–0.1)
Basophils Relative: 2 %
Eosinophils Absolute: 0.1 10*3/uL (ref 0.0–0.5)
Eosinophils Relative: 1 %
HCT: 25.8 % — ABNORMAL LOW (ref 36.0–46.0)
Hemoglobin: 8.2 g/dL — ABNORMAL LOW (ref 12.0–15.0)
Lymphocytes Relative: 22 %
Lymphs Abs: 1.3 10*3/uL (ref 0.7–4.0)
MCH: 27.8 pg (ref 26.0–34.0)
MCHC: 31.8 g/dL (ref 30.0–36.0)
MCV: 87.5 fL (ref 80.0–100.0)
Monocytes Absolute: 0.3 10*3/uL (ref 0.1–1.0)
Monocytes Relative: 6 %
Neutro Abs: 4 10*3/uL (ref 1.7–7.7)
Neutrophils Relative %: 69 %
Platelets: 422 10*3/uL — ABNORMAL HIGH (ref 150–400)
RBC: 2.95 MIL/uL — ABNORMAL LOW (ref 3.87–5.11)
RDW: 15.4 % (ref 11.5–15.5)
WBC: 5.8 10*3/uL (ref 4.0–10.5)
nRBC: 0 % (ref 0.0–0.2)

## 2024-09-11 LAB — COMPREHENSIVE METABOLIC PANEL WITH GFR
ALT: 10 U/L (ref 0–44)
AST: 13 U/L — ABNORMAL LOW (ref 15–41)
Albumin: 2.5 g/dL — ABNORMAL LOW (ref 3.5–5.0)
Alkaline Phosphatase: 93 U/L (ref 38–126)
Anion gap: 9 (ref 5–15)
BUN: 6 mg/dL — ABNORMAL LOW (ref 8–23)
CO2: 25 mmol/L (ref 22–32)
Calcium: 8.7 mg/dL — ABNORMAL LOW (ref 8.9–10.3)
Chloride: 107 mmol/L (ref 98–111)
Creatinine, Ser: 0.6 mg/dL (ref 0.44–1.00)
GFR, Estimated: >60 mL/min
Glucose, Bld: 120 mg/dL — ABNORMAL HIGH (ref 70–99)
Potassium: 3.9 mmol/L (ref 3.5–5.1)
Sodium: 141 mmol/L (ref 135–145)
Total Bilirubin: 0.3 mg/dL (ref 0.0–1.2)
Total Protein: 6.6 g/dL (ref 6.5–8.1)

## 2024-09-11 LAB — PROCALCITONIN: Procalcitonin: 2.19 ng/mL

## 2024-09-11 LAB — GLUCOSE, CAPILLARY
Glucose-Capillary: 109 mg/dL — ABNORMAL HIGH (ref 70–99)
Glucose-Capillary: 136 mg/dL — ABNORMAL HIGH (ref 70–99)

## 2024-09-11 LAB — PHOSPHORUS: Phosphorus: 2.5 mg/dL (ref 2.5–4.6)

## 2024-09-11 LAB — MAGNESIUM: Magnesium: 1.8 mg/dL (ref 1.7–2.4)

## 2024-09-11 LAB — PRO BRAIN NATRIURETIC PEPTIDE: Pro Brain Natriuretic Peptide: 690 pg/mL — ABNORMAL HIGH

## 2024-09-11 LAB — C-REACTIVE PROTEIN: CRP: 11.1 mg/dL — ABNORMAL HIGH

## 2024-09-11 MED ORDER — HYDRALAZINE HCL 20 MG/ML IJ SOLN: 10.0000 mg | Freq: Four times a day (QID) | INTRAMUSCULAR | Status: AC | PRN

## 2024-09-11 MED ORDER — AMLODIPINE BESYLATE 10 MG PO TABS
10.0000 mg | ORAL_TABLET | Freq: Every day | ORAL | Status: AC
  Administered 2024-09-11 – 2024-09-16 (×6): 10 mg via ORAL
  Filled 2024-09-11 (×6): qty 1

## 2024-09-11 MED ORDER — TRAVASOL 10 % IV SOLN
INTRAVENOUS | Status: AC
  Filled 2024-09-11: qty 1044

## 2024-09-11 MED ORDER — MAGNESIUM SULFATE 2 GM/50ML IV SOLN
2.0000 g | Freq: Once | INTRAVENOUS | Status: AC
  Administered 2024-09-11: 2 g via INTRAVENOUS
  Filled 2024-09-11: qty 50

## 2024-09-11 NOTE — Progress Notes (Signed)
"    Subjective/Chief Complaint: No new changes overnight Patient denies abdominal pain this morning   Objective: Vital signs in last 24 hours: Temp:  [99.4 F (37.4 C)-100.4 F (38 C)] 99.4 F (37.4 C) (02/01 0623) Pulse Rate:  [78-88] 84 (02/01 0102) Resp:  [16-18] 18 (02/01 0102) BP: (114-153)/(88-101) 153/88 (02/01 0102) SpO2:  [100 %] 100 % (01/31 2000) Last BM Date : 09/09/24  Intake/Output from previous day: No intake/output data recorded. Intake/Output this shift: No intake/output data recorded.  Exam: She appears well on exam and is in no distress.  She is answering questions and following commands Abdomen is soft and nontender except mildly in the right upper quadrant.  Drain output was not recorded but is purulent.  She has no frank peritonitis  Lab Results:  Recent Labs    09/09/24 0619 09/11/24 0538  WBC 5.8 5.8  HGB 8.8* 8.2*  HCT 27.3* 25.8*  PLT 450* 422*   BMET Recent Labs    09/10/24 0356 09/11/24 0538  NA 142 141  K 3.7 3.9  CL 106 107  CO2 26 25  GLUCOSE 107* 120*  BUN 6* 6*  CREATININE 0.68 0.60  CALCIUM  8.7* 8.7*   PT/INR No results for input(s): LABPROT, INR in the last 72 hours. ABG No results for input(s): PHART, HCO3 in the last 72 hours.  Invalid input(s): PCO2, PO2  Studies/Results: No results found.  Anti-infectives: Anti-infectives (From admission, onward)    Start     Dose/Rate Route Frequency Ordered Stop   08/30/24 1245  Ampicillin -Sulbactam (UNASYN ) 3 g in sodium chloride  0.9 % 100 mL IVPB        3 g 200 mL/hr over 30 Minutes Intravenous Every 6 hours 08/30/24 1151     08/29/24 0815  micafungin  (MYCAMINE ) 100 mg in sodium chloride  0.9 % 100 mL IVPB        100 mg 105 mL/hr over 1 Hours Intravenous Every 24 hours 08/29/24 0807     08/28/24 1200  vancomycin  (VANCOREADY) IVPB 750 mg/150 mL  Status:  Discontinued        750 mg 150 mL/hr over 60 Minutes Intravenous Every 12 hours 08/27/24 2352 08/29/24  0926   08/28/24 1000  fluconazole  (DIFLUCAN ) tablet 400 mg        400 mg Oral Daily 08/27/24 2351 08/28/24 0928   08/28/24 0400  piperacillin -tazobactam (ZOSYN ) IVPB 3.375 g  Status:  Discontinued        3.375 g 12.5 mL/hr over 240 Minutes Intravenous Every 8 hours 08/27/24 2352 08/30/24 1150   08/27/24 2345  vancomycin  (VANCOREADY) IVPB 1750 mg/350 mL        1,750 mg 175 mL/hr over 120 Minutes Intravenous  Once 08/27/24 2343 08/28/24 0706   08/27/24 2245  piperacillin -tazobactam (ZOSYN ) IVPB 3.375 g        3.375 g 100 mL/hr over 30 Minutes Intravenous  Once 08/27/24 2239 08/27/24 2325       Assessment/Plan: 64 yo female with a known gastric antral ulcer, presenting with acute abdominal pain and a large upper abdominal abscess, consistent with a contained perforation. S/p percutaneous drain placement 1/18. -IR with upsize of drain 1/26, cont to monitor - On unasyn  and micafungin . - CT confirms suspicion of gastric perforation contained within this abscess cavity now.  Her nutrition is poor.    -Again, likely operation next week.  Timing of surgery to be determined.  -WBC remains normal.  Would not advance past clear liquids.  Vicenta Poli  09/11/2024 ° °"

## 2024-09-11 NOTE — Plan of Care (Signed)
" °  Problem: Education: Goal: Knowledge of General Education information will improve Description: Including pain rating scale, medication(s)/side effects and non-pharmacologic comfort measures Outcome: Progressing   Problem: Health Behavior/Discharge Planning: Goal: Ability to manage health-related needs will improve Outcome: Progressing   Problem: Clinical Measurements: Goal: Ability to maintain clinical measurements within normal limits will improve Outcome: Progressing Goal: Will remain free from infection Outcome: Not Progressing Goal: Diagnostic test results will improve Outcome: Progressing Goal: Respiratory complications will improve Outcome: Progressing Goal: Cardiovascular complication will be avoided Outcome: Progressing   Problem: Activity: Goal: Risk for activity intolerance will decrease Outcome: Progressing   Problem: Nutrition: Goal: Adequate nutrition will be maintained Outcome: Progressing   Problem: Coping: Goal: Level of anxiety will decrease Outcome: Progressing   "

## 2024-09-11 NOTE — Plan of Care (Signed)
  Problem: Clinical Measurements: Goal: Will remain free from infection Outcome: Progressing Goal: Diagnostic test results will improve Outcome: Progressing Goal: Cardiovascular complication will be avoided Outcome: Progressing   Problem: Activity: Goal: Risk for activity intolerance will decrease Outcome: Progressing   

## 2024-09-11 NOTE — Progress Notes (Addendum)
 PHARMACY - TOTAL PARENTERAL NUTRITION CONSULT NOTE   Indication: Gastric fistula  Patient Measurements: Height: 5' 4 (162.6 cm) Weight: 81.6 kg (180 lb) IBW/kg (Calculated) : 54.7 TPN AdjBW (KG): 61.4 Body mass index is 30.9 kg/m. Usual Weight: 180-182 lbs  Assessment:  64 yo woman with admitted on 1/2 and found to have gastric ulcer, gastritis and esophageal candidiasis on EGD. Patient left AMA 1/3 due to child care and returned same day with continued abdominal pain and dark stools. Patient developed AKI and was eventually discharged home 1/12. Patient called EMS on 1/17 for black tarry stools, vomiting and weakness. CT found large perihepatic abscess secondary to fistulous connection from ruptured gastric ulcer, which was been persistent despite drain placement and drain upsizing. 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 Patient tolerated dysphagia 3 diet well and likes Boost Breeze supplements. Patient was NPO again 1/28 for CT.  On 1/28 Patient reports she ate broccoli last night and this morning. A few bits of broccoli florets are present in abscess drain bag. Pharmacy consulted for TPN.  1/30 Pt refused PICC line 1/29 so she did not get TPN but agreeable today to receive. Pending surgery next week. Surgery approved clear liquids for now.   Glucose / Insulin : A1C 5.7. BG < 120, used 0 units sSSI/24hr Electrolytes: K 3.9 (Received 45 mEq IV), CoCa 9.9, phos 2.5 (received 30 mmol), mg 1.8, others wnl Renal: Scr 0.6, BUN 6 Hepatic: Alk phos/ALT/tbili wnl, AST 13, albumin  2.5, TG 73 1/19  Intake / Output; MIVF: UOP x1 charted, drain 0 ml charted but per RN note copious amount of green milky drainage coming from drain site.requiring frequent dressing changes  GI Imaging: 1/17 CT: large R hepatic subcapsular abscess with fistulous communication to gastric antrum  1/22 UGI: esophageal  stasis/achalasia, delayed gastric emptying, 1/25 CT: subcapsular abscess in anterior liver, drain in collection 1/27 KUB: Paucity of bowel gas throughout the abdomen  1/28 CT: persistent subcapsular fluid abscess, unchanged  GI Surgeries / Procedures:  1/18 R perihepatic abscess drain placement  1/26 drain upsize  Central access: PICC 1/30 TPN start date: 1/30  Nutritional Goals: Goal TPN rate is 75 mL/hr (provides 104 g of protein and 1753 kcals per day)  RD Assessment: Estimated Needs Total Energy Estimated Needs: 1700-1900 Total Protein Estimated Needs: 90-105 grams Total Fluid Estimated Needs: 1.7-1.9 L  Current Nutrition:  Clear liquid diet + TPN   Plan:  Continue TPN at goal 75 mL/hr at 1800, provides 100% estimated needs   Electrolytes in TPN: Na 100 mEq/L, increase K 45 mEq/L, Ca 2 mEq/L, increase Mg 54mEq/L, increase Phos 21 mmol/L. Cl:Ac 1:1  Add standard MVI and trace elements to TPN Give mag 2g IV x1  Stop Sensitive q6h SSI   Thiamine  Dc'd by MD  Monitor TPN labs daily until stable at goal then on Mon/Thurs   Jinnie Door, PharmD, BCPS, [facility] Clinical Pharmacist  Please check AMION for all [facility] phone numbers After 10:00 PM, call Main Pharmacy [phone]

## 2024-09-12 ENCOUNTER — Inpatient Hospital Stay (HOSPITAL_COMMUNITY)

## 2024-09-12 DIAGNOSIS — K922 Gastrointestinal hemorrhage, unspecified: Secondary | ICD-10-CM | POA: Diagnosis not present

## 2024-09-12 LAB — COMPREHENSIVE METABOLIC PANEL WITH GFR
ALT: 11 U/L (ref 0–44)
AST: 16 U/L (ref 15–41)
Albumin: 2.6 g/dL — ABNORMAL LOW (ref 3.5–5.0)
Alkaline Phosphatase: 89 U/L (ref 38–126)
Anion gap: 12 (ref 5–15)
BUN: 6 mg/dL — ABNORMAL LOW (ref 8–23)
CO2: 25 mmol/L (ref 22–32)
Calcium: 8.6 mg/dL — ABNORMAL LOW (ref 8.9–10.3)
Chloride: 105 mmol/L (ref 98–111)
Creatinine, Ser: 0.51 mg/dL (ref 0.44–1.00)
GFR, Estimated: >60 mL/min
Glucose, Bld: 111 mg/dL — ABNORMAL HIGH (ref 70–99)
Potassium: 3.8 mmol/L (ref 3.5–5.1)
Sodium: 141 mmol/L (ref 135–145)
Total Bilirubin: 0.2 mg/dL (ref 0.0–1.2)
Total Protein: 6.6 g/dL (ref 6.5–8.1)

## 2024-09-12 LAB — TRIGLYCERIDES: Triglycerides: 80 mg/dL

## 2024-09-12 LAB — MAGNESIUM: Magnesium: 2.3 mg/dL (ref 1.7–2.4)

## 2024-09-12 LAB — PHOSPHORUS: Phosphorus: 2.6 mg/dL (ref 2.5–4.6)

## 2024-09-12 MED ORDER — TRAVASOL 10 % IV SOLN
INTRAVENOUS | Status: AC
  Filled 2024-09-12: qty 1044

## 2024-09-12 MED ORDER — METHOCARBAMOL 1000 MG/10ML IJ SOLN
1000.0000 mg | Freq: Three times a day (TID) | INTRAMUSCULAR | Status: DC
  Administered 2024-09-12 – 2024-09-15 (×8): 1000 mg via INTRAVENOUS
  Filled 2024-09-12 (×8): qty 10

## 2024-09-12 MED ORDER — ACETAMINOPHEN 10 MG/ML IV SOLN
1000.0000 mg | Freq: Four times a day (QID) | INTRAVENOUS | Status: AC
  Administered 2024-09-12 – 2024-09-13 (×3): 1000 mg via INTRAVENOUS
  Filled 2024-09-12 (×4): qty 100

## 2024-09-12 MED ORDER — PROSOURCE PLUS PO LIQD
30.0000 mL | Freq: Two times a day (BID) | ORAL | Status: DC
  Administered 2024-09-12: 30 mL via ORAL
  Filled 2024-09-12 (×2): qty 30

## 2024-09-12 MED ORDER — HYDROMORPHONE HCL 1 MG/ML IJ SOLN
0.5000 mg | Freq: Once | INTRAMUSCULAR | Status: AC
  Administered 2024-09-12: 0.5 mg via INTRAVENOUS
  Filled 2024-09-12: qty 0.5

## 2024-09-12 MED ORDER — TRAMADOL HCL 50 MG PO TABS
50.0000 mg | ORAL_TABLET | Freq: Four times a day (QID) | ORAL | Status: DC
  Administered 2024-09-12 – 2024-09-15 (×10): 50 mg via ORAL
  Filled 2024-09-12 (×12): qty 1

## 2024-09-12 NOTE — Plan of Care (Signed)

## 2024-09-12 NOTE — TOC Progression Note (Signed)
 Transition of Care [facility]) - Progression Note    Patient Details  Name: Jennifer [name] MRN: [id number] Date of Birth: [birth year]  Transition of Care [facility]) CM/SW Contact  Landry DELENA Senters, RN Phone Number: 09/12/2024, 1:13 PM  Clinical Narrative:     Potential plan for surgery tomorrow for abscess. IVC remains in place.  CM will continue to follow.  Expected Discharge Plan: Home/Self Care Barriers to Discharge: Continued Medical Work up               Expected Discharge Plan and Services       Living arrangements for the past 2 months: Single Family Home                                       Social Drivers of Health (SDOH) Interventions SDOH Screenings   Food Insecurity: No Food Insecurity (08/29/2024)  Housing: Low Risk (08/29/2024)  Transportation Needs: No Transportation Needs (08/29/2024)  Recent Concern: Transportation Needs - Unmet Transportation Needs (08/14/2024)  Utilities: Not At Risk (08/29/2024)  Depression (PHQ2-9): Low Risk (05/24/2024)  Recent Concern: Depression (PHQ2-9) - Medium Risk (03/04/2024)  Tobacco Use: High Risk (08/28/2024)    Readmission Risk Interventions     No data to display

## 2024-09-13 DIAGNOSIS — K922 Gastrointestinal hemorrhage, unspecified: Secondary | ICD-10-CM | POA: Diagnosis not present

## 2024-09-13 LAB — CBC WITH DIFFERENTIAL/PLATELET
Abs Immature Granulocytes: 0.05 10*3/uL (ref 0.00–0.07)
Basophils Absolute: 0 10*3/uL (ref 0.0–0.1)
Basophils Relative: 0 %
Eosinophils Absolute: 0.1 10*3/uL (ref 0.0–0.5)
Eosinophils Relative: 2 %
HCT: 24.5 % — ABNORMAL LOW (ref 36.0–46.0)
Hemoglobin: 7.7 g/dL — ABNORMAL LOW (ref 12.0–15.0)
Immature Granulocytes: 1 %
Lymphocytes Relative: 18 %
Lymphs Abs: 1.1 10*3/uL (ref 0.7–4.0)
MCH: 27.5 pg (ref 26.0–34.0)
MCHC: 31.4 g/dL (ref 30.0–36.0)
MCV: 87.5 fL (ref 80.0–100.0)
Monocytes Absolute: 1.1 10*3/uL — ABNORMAL HIGH (ref 0.1–1.0)
Monocytes Relative: 19 %
Neutro Abs: 3.4 10*3/uL (ref 1.7–7.7)
Neutrophils Relative %: 60 %
Platelets: 410 10*3/uL — ABNORMAL HIGH (ref 150–400)
RBC: 2.8 MIL/uL — ABNORMAL LOW (ref 3.87–5.11)
RDW: 15.5 % (ref 11.5–15.5)
Smear Review: NORMAL
WBC: 5.7 10*3/uL (ref 4.0–10.5)
nRBC: 0.4 % — ABNORMAL HIGH (ref 0.0–0.2)

## 2024-09-13 LAB — BASIC METABOLIC PANEL WITH GFR
Anion gap: 8 (ref 5–15)
BUN: 7 mg/dL — ABNORMAL LOW (ref 8–23)
CO2: 24 mmol/L (ref 22–32)
Calcium: 8.7 mg/dL — ABNORMAL LOW (ref 8.9–10.3)
Chloride: 106 mmol/L (ref 98–111)
Creatinine, Ser: 0.49 mg/dL (ref 0.44–1.00)
GFR, Estimated: >60 mL/min
Glucose, Bld: 116 mg/dL — ABNORMAL HIGH (ref 70–99)
Potassium: 4.3 mmol/L (ref 3.5–5.1)
Sodium: 138 mmol/L (ref 135–145)

## 2024-09-13 LAB — TYPE AND SCREEN
ABO/RH(D): O POS
Antibody Screen: NEGATIVE

## 2024-09-13 LAB — PROTIME-INR
INR: 1.2 (ref 0.8–1.2)
Prothrombin Time: 15.8 s — ABNORMAL HIGH (ref 11.4–15.2)

## 2024-09-13 LAB — PHOSPHORUS: Phosphorus: 2.8 mg/dL (ref 2.5–4.6)

## 2024-09-13 MED ORDER — BOOST / RESOURCE BREEZE PO LIQD CUSTOM: 1.0000 | ORAL | Status: AC

## 2024-09-13 MED ORDER — MELATONIN 3 MG PO TABS
3.0000 mg | ORAL_TABLET | Freq: Every evening | ORAL | Status: AC | PRN
  Administered 2024-09-13 – 2024-09-14 (×2): 3 mg via ORAL
  Filled 2024-09-13 (×2): qty 1

## 2024-09-13 MED ORDER — BOOST / RESOURCE BREEZE PO LIQD CUSTOM
1.0000 | Freq: Two times a day (BID) | ORAL | Status: DC
  Administered 2024-09-13: 1 via ORAL

## 2024-09-13 MED ORDER — TRAVASOL 10 % IV SOLN
INTRAVENOUS | Status: AC
  Filled 2024-09-13: qty 1044

## 2024-09-13 NOTE — Progress Notes (Signed)
 Psychiatric Nurse Liaison (PNL) Rounding Note  Current SI/HI/AVH: refused to answer  Patient Mood/Affect: irritable  Noted Patient Behaviors: pt refused to speak and asked PNL to leave her room. You wouldn't come to someone's house this late (2030) and try to speak to them. I am in the hospital because I am sick in my body and I don't want to talk to you. I don't have to talk to you. You don't have permission to talk to me.   Interventions Initiated by Psychiatric Nurse Liaison: Attempted to speak to pt but pt refused; thanked pt for her time and exited room.  Recommendations for Patient Care: no new recommendations; sitter with pt  Patients Response to Treatment: no change  Time Spent with Patient:   5 minutes   Loetta Pinal RN, BSN, RN-BC

## 2024-09-13 NOTE — Plan of Care (Signed)
" °  Problem: Education: Goal: Knowledge of General Education information will improve Description: Including pain rating scale, medication(s)/side effects and non-pharmacologic comfort measures Outcome: Progressing   Problem: Clinical Measurements: Goal: Ability to maintain clinical measurements within normal limits will improve Outcome: Progressing Goal: Will remain free from infection Outcome: Progressing Goal: Diagnostic test results will improve Outcome: Progressing Goal: Respiratory complications will improve Outcome: Progressing Goal: Cardiovascular complication will be avoided Outcome: Progressing   Problem: Activity: Goal: Risk for activity intolerance will decrease Outcome: Progressing   Problem: Nutrition: Goal: Adequate nutrition will be maintained Outcome: Progressing   Problem: Coping: Goal: Level of anxiety will decrease Outcome: Progressing   Problem: Elimination: Goal: Will not experience complications related to urinary retention Outcome: Progressing   Problem: Pain Managment: Goal: General experience of comfort will improve and/or be controlled Outcome: Progressing   Problem: Safety: Goal: Ability to remain free from injury will improve Outcome: Progressing   Problem: Skin Integrity: Goal: Risk for impaired skin integrity will decrease Outcome: Progressing   Problem: Activity: Goal: Ability to tolerate increased activity will improve Outcome: Progressing   Problem: Clinical Measurements: Goal: Ability to maintain a body temperature in the normal range will improve Outcome: Progressing   Problem: Respiratory: Goal: Ability to maintain adequate ventilation will improve Outcome: Progressing Goal: Ability to maintain a clear airway will improve Outcome: Progressing   "

## 2024-09-13 NOTE — Progress Notes (Signed)
 Nutrition Follow-up  DOCUMENTATION CODES:   Obesity unspecified  INTERVENTION:  - TPN at goal.   - TPN management per pharmacy.   - Clear Liquid diet per MD.  - Boost Breeze po daily, provides 250 kcal and 9 grams of protein  - Daily weights while on TPN.  NUTRITION DIAGNOSIS:   Inadequate oral intake related to altered GI function as evidenced by NPO status. *improving   GOAL:   Patient will meet greater than or equal to 90% of their needs *met with TPN  MONITOR:   PO intake, Supplement acceptance, Diet advancement  REASON FOR ASSESSMENT:   Consult Assessment of nutrition requirement/status  ASSESSMENT:   64 y.o. female with medical history significant of history of GI bleed (recent admission 1/3 for melena-EGD showed esophageal plaques suspicious for candidiasis nonbleeding gastric ulcer and gastritis-patient left AMA before completion of the treatment), essential hypertension, polysubstance abuse disorder, tobacco use disorder, thrombocytopenia, essential hypertension, physical deconditioning/debility and weakness, and morbid obesity presented complaining of black-colored stool that started 1/17/26and patient also has generalized weakness.  1/18- s/p CT guided right perihepatic abscess drain placement  1/22- s/p UGI- no leak (suspicious for achalasia), advanced to clear liquids 1/23- advanced to full liquid diet 1/24- advanced to soft diet 1/25- CT revealed persistent large perihepatic abscess  1/28- CT reveals persistent large perihepatic collection, suspicion for gastric perforation contained within the abscess, 1/28- CT confirms suspicion of gastric perforation contained within this abscess cavity 1/29 advanced to clear liquids 1/30 PICC placed; TPN initiated 1/31 TPN advanced to goal   Only 1 meal documented over the past week, 100% of breakfast on 1/31. Surgery recommending not advancing past clear liquids.  Patient reports she is taking in PO but the diet  is horrible. Would like it advanced, reminded patient of the reason she is on a clear liquid diet. She is agreeable to receive Boost Breeze again, will order daily.  TPN remains at goal of 96mL/hr, providing 104g of protein and 1753 kcals daily.  Surgical plans pending at this time.    Medications reviewed and include: -   Labs reviewed: - HA1C 5.7  Triglycerides 80 (as of 2/2)  Diet Order:   Diet Order             Diet clear liquid Fluid consistency: Thin  Diet effective now                   EDUCATION NEEDS:  Not appropriate for education at this time  Skin:  Skin Assessment: Reviewed RN Assessment  Last BM:  1/30  Height:  Ht Readings from Last 1 Encounters:  08/27/24 5' 4 (1.626 m)   Weight:  Wt Readings from Last 1 Encounters:  08/27/24 81.6 kg   Ideal Body Weight:  54.5 kg  BMI:  Body mass index is 30.9 kg/m.  Estimated Nutritional Needs:  Kcal:  1700-1900 Protein:  90-105 grams Fluid:  1.7-1.9 L    Trude Ned RD, LDN Contact via Secure Chat.

## 2024-09-13 NOTE — Progress Notes (Signed)
 Psychiatric Nurse Liaison (PNL) Rounding Note  Attempted to round on patient, however patient was sleeping. Respirations even and unlabored. No distress noted.

## 2024-09-13 NOTE — Progress Notes (Signed)
 "   Referring Physician(s): Dr. Leonor Dawn  Supervising Physician: Jennefer Rover  Patient Status:  [facility] - In-pt  Chief Complaint:  Large perihepatic abscess with 10Fr perihepatic abscess RUQ drain placed 08/28/24 by Dr. Jennefer, upsized and manipulated 1/26 by Dr. Philip.   Subjective:  Patient sitting up in bed. Is asking for a cheeseburger.   Allergies: Patient has no known allergies.  Medications: Prior to Admission medications  Medication Sig Start Date End Date Taking? Authorizing Provider  [Paused] amLODipine  (NORVASC ) 10 MG tablet TAKE 1 TABLET (10 MG TOTAL) BY MOUTH DAILY (Office visit for future refills) Wait to take this until your doctor or other care provider tells you to start again. 07/14/24   Newlin, Enobong, MD  atorvastatin  (LIPITOR) 10 MG tablet Take 1 tablet (10 mg total) by mouth daily. Patient not taking: Reported on 08/28/2024 07/14/24   Newlin, Enobong, MD  Blood Pressure Monitoring (BLOOD PRESSURE CUFF) MISC 1 Units by Does not apply route daily. 05/24/24   Mayers, Cari S, PA-C  dicyclomine  (BENTYL ) 20 MG tablet Take 1 tablet (20 mg total) by mouth 3 (three) times daily as needed for spasms. Patient not taking: Reported on 08/28/2024 08/22/24 09/21/24  Christobal Guadalajara, MD  folic acid  (FOLVITE ) 1 MG tablet Take 1 tablet (1 mg total) by mouth daily. Patient not taking: Reported on 08/28/2024 08/22/24 09/21/24  Christobal Guadalajara, MD  [Paused] lisinopril -hydrochlorothiazide  (ZESTORETIC ) 20-12.5 MG tablet Take 2 tablets by mouth daily. Wait to take this until your doctor or other care provider tells you to start again. 07/14/24   Newlin, Enobong, MD  metoprolol  succinate (TOPROL -XL) 50 MG 24 hr tablet Take 1 tablet (50 mg total) by mouth daily. Patient not taking: Reported on 08/28/2024 07/14/24   Newlin, Enobong, MD  oxyCODONE -acetaminophen  (PERCOCET/ROXICET) 5-325 MG tablet Take 1 tablet by mouth every 6 (six) hours as needed for moderate pain (pain score 4-6). Patient not taking:  Reported on 08/28/2024 08/22/24   Christobal Guadalajara, MD  pantoprazole  (PROTONIX ) 40 MG tablet Take 1 tablet (40 mg total) by mouth 2 (two) times daily. Patient not taking: Reported on 08/28/2024 08/22/24 09/21/24  Christobal Guadalajara, MD  thiamine  (VITAMIN B-1) 100 MG tablet Take 1 tablet (100 mg total) by mouth daily. Patient not taking: Reported on 08/28/2024 08/22/24 09/21/24  Christobal Guadalajara, MD     Vital Signs: BP (!) 158/94 (BP Location: Left Arm)   Pulse 96   Temp 100 F (37.8 C) (Oral)   Resp 18   Ht 5' 4 (1.626 m)   Wt 180 lb (81.6 kg)   LMP 12/04/2009   SpO2 98%   BMI 30.90 kg/m   Physical Exam Vitals and nursing note reviewed.  Constitutional:      Appearance: She is well-developed.  HENT:     Head: Normocephalic and atraumatic.  Eyes:     Conjunctiva/sclera: Conjunctivae normal.  Pulmonary:     Effort: Pulmonary effort is normal.  Abdominal:     Comments: Positive RUG  drain to gravity bag. Site is unremarkable with no erythema, edema, tenderness, bleeding or drainage noted at exit site. Suture k in place. Dressing is clean dry and intact. < 5 ml ml of  pale yellow colored fluid noted in gravity bag. Per RN Drain is able to be flushed easily. No leakage or pain with flushing.  Insertion site clean and dry.     Musculoskeletal:     Cervical back: Normal range of motion.  Skin:    General: Skin is  warm and dry.  Neurological:     Mental Status: She is alert and oriented to person, place, and time. Mental status is at baseline.     Imaging: DG Abd Portable 1V Result Date: 09/12/2024 CLINICAL DATA:  Abdominal pain EXAM: PORTABLE ABDOMEN - 1 VIEW COMPARISON:  09/06/2024, 09/07/2024 FINDINGS: Two supine frontal views of the abdomen and pelvis are obtained. Percutaneous cholecystostomy tube is coiled over the lateral aspect of the right upper quadrant, and appears to have been withdrawn 6-7 cm in the interim. Please correlate with catheter function. There is a relative paucity of bowel gas. No  evidence of high-grade bowel obstruction or ileus. Persistent right basilar consolidation and effusion. IMPRESSION: 1. Percutaneous cholecystostomy tube overlying the lateral aspect of the right upper quadrant, and a more lateral position since prior imaging studies. This may have been withdrawn 6-7 cm in the interim, and correlation with catheter function is recommended. 2. Paucity of bowel gas.  No evidence of high-grade obstruction. Electronically Signed   By: Ozell Daring M.D.   On: 09/12/2024 16:42    Labs:  CBC: Recent Labs    09/07/24 0232 09/09/24 0619 09/11/24 0538 09/13/24 0238  WBC 6.1 5.8 5.8 5.7  HGB 8.9* 8.8* 8.2* 7.7*  HCT 27.3* 27.3* 25.8* 24.5*  PLT 561* 450* 422* 410*    COAGS: Recent Labs    08/13/24 2127 08/28/24 0848 09/13/24 0238  INR 1.0 1.2 1.2    BMP: Recent Labs    09/10/24 0356 09/11/24 0538 09/12/24 0138 09/13/24 0238  NA 142 141 141 138  K 3.7 3.9 3.8 4.3  CL 106 107 105 106  CO2 26 25 25 24   GLUCOSE 107* 120* 111* 116*  BUN 6* 6* 6* 7*  CALCIUM  8.7* 8.7* 8.6* 8.7*  CREATININE 0.68 0.60 0.51 0.49  GFRNONAA >60 >60 >60 >60    LIVER FUNCTION TESTS: Recent Labs    09/01/24 0238 09/08/24 0242 09/11/24 0538 09/12/24 0138  BILITOT 0.3 0.4 0.3 0.2  AST 28 17 13* 16  ALT 29 13 10 11   ALKPHOS 102 83 93 89  PROT 6.7 6.7 6.6 6.6  ALBUMIN  2.5* 2.5* 2.5* 2.6*    Assessment and Plan:  89 y. Female inpatient. History of HTN, bipolar 1 disorder, polysubstance abuse who was admitted due to perforated gastric ulcer and hepatic abscess s.p  perihepatic abscess drain placed on 1.18.26.per notes plan is for possible repair on possibly 2.5.26  Drain Location: RUQ Size: Fr size: 16 Fr upsized on 1.26.26 Date of placement: 1.18.26  Currently to: Drain collection device: gravity 24 hour output:  Output by Drain (mL) 09/11/24 0700 - 09/11/24 1459 09/11/24 1500 - 09/11/24 2259 09/11/24 2300 - 09/12/24 0659 09/12/24 0700 - 09/12/24 1459 09/12/24  1500 - 09/12/24 2259 09/12/24 2300 - 09/13/24 0659 09/13/24 0700 - 09/13/24 1203  Closed System Drain 1 RLQ Bulb (JP) 16 Fr.    225  100     Interval imaging/drain manipulation:  CT abd 1.28.26  Current examination: Flushes/aspirates easily.  Insertion site unremarkable. Suture and stat lock in place. Dressed appropriately.   Plan: Continue TID flushes with 5 cc NS. Record output Q shift. Dressing changes QD or PRN if soiled.  Call IR APP or on call IR MD if difficulty flushing or sudden change in drain output.    Discharge planning: Please contact IR APP or on call IR MD prior to patient d/c to ensure appropriate follow up plans are in place.  IR scheduler will  contact patient with date/time of appointment. Patient will need to flush drain QD with 5 cc NS, record output QD, dressing changes every 2-3 days or earlier if soiled.   IR will continue to follow - please call with questions or concerns.   Electronically Signed: Delon JAYSON Beagle, NP 09/13/2024, 12:02 PM   I spent a total of 15 Minutes at the patient's bedside AND on the patient's hospital floor or unit, greater than 50% of which was counseling/coordinating care for IR RUQ drain.      "

## 2024-09-13 NOTE — Plan of Care (Signed)

## 2024-09-14 LAB — CBC WITH DIFFERENTIAL/PLATELET
Abs Immature Granulocytes: 0.06 10*3/uL (ref 0.00–0.07)
Basophils Absolute: 0 10*3/uL (ref 0.0–0.1)
Basophils Relative: 0 %
Eosinophils Absolute: 0.2 10*3/uL (ref 0.0–0.5)
Eosinophils Relative: 3 %
HCT: 27.5 % — ABNORMAL LOW (ref 36.0–46.0)
Hemoglobin: 8.5 g/dL — ABNORMAL LOW (ref 12.0–15.0)
Immature Granulocytes: 1 %
Lymphocytes Relative: 27 %
Lymphs Abs: 1.5 10*3/uL (ref 0.7–4.0)
MCH: 27 pg (ref 26.0–34.0)
MCHC: 30.9 g/dL (ref 30.0–36.0)
MCV: 87.3 fL (ref 80.0–100.0)
Monocytes Absolute: 0.9 10*3/uL (ref 0.1–1.0)
Monocytes Relative: 16 %
Neutro Abs: 3 10*3/uL (ref 1.7–7.7)
Neutrophils Relative %: 53 %
Platelets: 542 10*3/uL — ABNORMAL HIGH (ref 150–400)
RBC: 3.15 MIL/uL — ABNORMAL LOW (ref 3.87–5.11)
RDW: 15.8 % — ABNORMAL HIGH (ref 11.5–15.5)
Smear Review: NORMAL
WBC: 5.7 10*3/uL (ref 4.0–10.5)
nRBC: 0.4 % — ABNORMAL HIGH (ref 0.0–0.2)

## 2024-09-14 LAB — COMPREHENSIVE METABOLIC PANEL WITH GFR
ALT: 12 U/L (ref 0–44)
AST: 19 U/L (ref 15–41)
Albumin: 2.9 g/dL — ABNORMAL LOW (ref 3.5–5.0)
Alkaline Phosphatase: 94 U/L (ref 38–126)
Anion gap: 10 (ref 5–15)
BUN: 8 mg/dL (ref 8–23)
CO2: 23 mmol/L (ref 22–32)
Calcium: 9.4 mg/dL (ref 8.9–10.3)
Chloride: 103 mmol/L (ref 98–111)
Creatinine, Ser: 0.53 mg/dL (ref 0.44–1.00)
GFR, Estimated: >60 mL/min
Glucose, Bld: 99 mg/dL (ref 70–99)
Potassium: 4.7 mmol/L (ref 3.5–5.1)
Sodium: 137 mmol/L (ref 135–145)
Total Bilirubin: 0.3 mg/dL (ref 0.0–1.2)
Total Protein: 7.7 g/dL (ref 6.5–8.1)

## 2024-09-14 LAB — PHOSPHORUS: Phosphorus: 3.3 mg/dL (ref 2.5–4.6)

## 2024-09-14 LAB — MAGNESIUM: Magnesium: 2.2 mg/dL (ref 1.7–2.4)

## 2024-09-14 LAB — GLUCOSE, CAPILLARY
Glucose-Capillary: 113 mg/dL — ABNORMAL HIGH (ref 70–99)
Glucose-Capillary: 108 mg/dL — ABNORMAL HIGH (ref 70–99)

## 2024-09-14 LAB — PREALBUMIN: Prealbumin: 10 mg/dL — ABNORMAL LOW (ref 18–38)

## 2024-09-14 MED ORDER — TRAVASOL 10 % IV SOLN
INTRAVENOUS | Status: AC
  Filled 2024-09-14: qty 1044

## 2024-09-14 MED ORDER — HYDROMORPHONE HCL 1 MG/ML IJ SOLN
1.0000 mg | INTRAMUSCULAR | Status: DC | PRN
  Administered 2024-09-14 – 2024-09-15 (×6): 1 mg via INTRAVENOUS
  Filled 2024-09-14 (×6): qty 1

## 2024-09-14 NOTE — Progress Notes (Signed)
 "  General Surgery Follow Up Note  Subjective:    Overnight Issues:   Objective:  Vital signs for last 24 hours: Temp:  [98.8 F (37.1 C)-99.2 F (37.3 C)] 99.2 F (37.3 C) (02/04 1639) Pulse Rate:  [80-88] 88 (02/04 1639) Resp:  [16-20] 18 (02/04 1639) BP: (123-136)/(78-89) 136/83 (02/04 1639) SpO2:  [95 %] 95 % (02/03 1956) Weight:  [81.2 kg] 81.2 kg (02/04 0500)  Hemodynamic parameters for last 24 hours:    Intake/Output from previous day: 02/03 0701 - 02/04 0700 In: 610 [P.O.:600] Out: 110 [Drains:110]  Intake/Output this shift: Total I/O In: 10 [I.V.:10] Out: -   Vent settings for last 24 hours:    Physical Exam:  Gen: comfortable, no distress Neuro: follows commands, alert, communicative HEENT: PERRL Neck: supple CV: RRR Pulm: unlabored breathing on RA Abd: soft, NT GU: urine clear and yellow, +spontaneous void Extr: wwp, no edema  Results for orders placed or performed during the hospital encounter of 08/27/24 (from the past 24 hours)  CBC with Differential/Platelet     Status: Abnormal   Collection Time: 09/14/24  3:45 AM  Result Value Ref Range   WBC 5.7 4.0 - 10.5 K/uL   RBC 3.15 (L) 3.87 - 5.11 MIL/uL   Hemoglobin 8.5 (L) 12.0 - 15.0 g/dL   HCT 72.4 (L) 63.9 - 53.9 %   MCV 87.3 80.0 - 100.0 fL   MCH 27.0 26.0 - 34.0 pg   MCHC 30.9 30.0 - 36.0 g/dL   RDW 84.1 (H) 88.4 - 84.4 %   Platelets 542 (H) 150 - 400 K/uL   nRBC 0.4 (H) 0.0 - 0.2 %   Neutrophils Relative % 53 %   Neutro Abs 3.0 1.7 - 7.7 K/uL   Lymphocytes Relative 27 %   Lymphs Abs 1.5 0.7 - 4.0 K/uL   Monocytes Relative 16 %   Monocytes Absolute 0.9 0.1 - 1.0 K/uL   Eosinophils Relative 3 %   Eosinophils Absolute 0.2 0.0 - 0.5 K/uL   Basophils Relative 0 %   Basophils Absolute 0.0 0.0 - 0.1 K/uL   WBC Morphology MORPHOLOGY UNREMARKABLE    RBC Morphology MORPHOLOGY UNREMARKABLE    Smear Review Normal platelet morphology    Immature Granulocytes 1 %   Abs Immature Granulocytes  0.06 0.00 - 0.07 K/uL  Comprehensive metabolic panel with GFR     Status: Abnormal   Collection Time: 09/14/24  3:45 AM  Result Value Ref Range   Sodium 137 135 - 145 mmol/L   Potassium 4.7 3.5 - 5.1 mmol/L   Chloride 103 98 - 111 mmol/L   CO2 23 22 - 32 mmol/L   Glucose, Bld 99 70 - 99 mg/dL   BUN 8 8 - 23 mg/dL   Creatinine, Ser 9.46 0.44 - 1.00 mg/dL   Calcium  9.4 8.9 - 10.3 mg/dL   Total Protein 7.7 6.5 - 8.1 g/dL   Albumin  2.9 (L) 3.5 - 5.0 g/dL   AST 19 15 - 41 U/L   ALT 12 0 - 44 U/L   Alkaline Phosphatase 94 38 - 126 U/L   Total Bilirubin 0.3 0.0 - 1.2 mg/dL   GFR, Estimated >39 >39 mL/min   Anion gap 10 5 - 15  Magnesium      Status: None   Collection Time: 09/14/24  3:45 AM  Result Value Ref Range   Magnesium  2.2 1.7 - 2.4 mg/dL  Phosphorus     Status: None   Collection Time: 09/14/24  3:45 AM  Result Value Ref Range   Phosphorus 3.3 2.5 - 4.6 mg/dL  Prealbumin     Status: Abnormal   Collection Time: 09/14/24  3:45 AM  Result Value Ref Range   Prealbumin 10 (L) 18 - 38 mg/dL  Glucose, capillary     Status: Abnormal   Collection Time: 09/14/24 12:50 PM  Result Value Ref Range   Glucose-Capillary 113 (H) 70 - 99 mg/dL  Glucose, capillary     Status: Abnormal   Collection Time: 09/14/24  4:44 PM  Result Value Ref Range   Glucose-Capillary 108 (H) 70 - 99 mg/dL    Assessment & Plan:  Present on Admission:  GI bleed  Substance abuse (HCC)  Candidiasis of esophagus (HCC)  Essential hypertension  Liver abscess  Bipolar affective disorder, currently manic, mild (HCC)    LOS: 18 days   Additional comments:I reviewed the patient's new clinical lab test results.   and I reviewed the patients new imaging test results.    64 yo female with a known gastric antral ulcer, presenting with acute abdominal pain and a large upper abdominal abscess, consistent with a contained perforation. S/p percutaneous drain placement 1/18. -IR with upsize of drain 1/26, cont to  monitor. TID flushes and dressing changes q shift - On unasyn  and micafungin . - CT confirms suspicion of gastric perforation contained within this abscess cavity now.  Her nutrition is poor. Has been started on TPN. Rechecked nutrition labs 2/4, PA10, alb 2.9. Plan distal gastrectomy BI vs BII and j-tube. Extensive discussion with patient regarding operative plan and risks discussed in detail, to include bleeding, infection, abscess, anastomotic leak, anastomotic dehiscence, dumping syndrome, bile reflux gastritis, afferent loop syndrome, nutritional deficiencies, injury to nearby structures, and need for additional surgery. Will plan for admission to ICU post-op in anticipation of NGT, JP drain(s), J-tube. Informed consent obtained from the patient after answering all her questions.  -Strict NPO   Jennifer GEANNIE Hanger, MD Trauma & General Surgery Please use [email or web address] to contact on call provider  09/14/2024  *Care during the described time interval was provided by me. I have reviewed this patient's available data, including medical history, events of note, physical examination and test results as part of my evaluation.  "

## 2024-09-14 NOTE — Progress Notes (Signed)
" ° ° °  PROCEDURAL EXPEDITER PROGRESS NOTE  Patient Name: Jennifer [name]  DOB:[birth year] Date of Admission: 08/27/2024  Date of Assessment:09/14/24   -------------------------------------------------------------------------------------------------------------------   Brief clinical summary: patient going to OR tomorrow for a partial gastrectomy  Orders in place:   Yes   Communication with surgical team if no orders: none  Labs, test, and orders reviewed: yes  Requires surgical clearance:   No  What type of clearance: n/a  Clearance received: n/a  Barriers noted:none   Intervention provided by [facility] team: none  Barrier resolved:   not applicable   -------------------------------------------------------------------------------------------------------------------  [facility] Patient Care Command Expediter, Rexene LITTIE Kirks Please contact us  directly via secure chat (search for [facility]) or by calling us  at [phone] [facility]).  "

## 2024-09-14 NOTE — Progress Notes (Signed)
 Psychiatric Nurse Liaison (PNL) Rounding Note  Current SI/HI/AVH: Denies SI;plan and intent, denies HI and AVH   Patient Mood/Affect: Irritable   Noted Patient Behaviors: Patient in bed watching tv, pt agreeable to speak with me, denies any needs. Sitter in place.  Interventions Initiated by Psychiatric Nurse Liaison: Emotional support, therapeutic communication.  Recommendations for Patient Care: No new recommendations.   Patients Response to Treatment: No change   Time Spent with Patient:  5 minutes

## 2024-09-14 NOTE — Progress Notes (Signed)
 [facility] Progress Note Patient: Jennifer [name] FMW:[id number] DOB: June 22, 1961  DOA: 08/27/2024 DOS: the patient was seen and examined on 09/14/2024  Brief Summary: Patient is a 64 y.o.  female with history of HTN, EtOH use, substance abuse (prior UDS positive for opiates/cocaine/benzos)-who recently had a admission for melena-EGD showed gastric ulcer-patient subsequently left AMA-presented with generalized weakness/melanotic stools/upper abdominal pain-she was found to have right hepatic abscess with fistulous communication to a contained gastric perforation.   Significant events: 1/17>> admit to [facility] 1/22>> underwent upper GI series.  Diet advanced. 1/25>> repeat CT abdomen pelvis with contrast shows persistent abscess. 1/26>> underwent upsizing of her drain with IR.  Developed fever after that.  Blood cultures ordered.  Remains on antibiotics.   Significant studies: 1/17>> CT abdomen/pelvis: Large right subhepatic subcapsular abscess with fistulous communication to the gastric antrum.  New moderate-sized right pleural effusion.  1/22>> upper GI series esophageal distention with severe esophageal dysmotility   Significant microbiology data: 1/18>> blood culture: No growth 1/18>> pleural fluid culture: No growth 1/18>> right hepatic abscess culture: Lactobacillus/rare yeast   Procedures: 1/18>> CT-guided right perihepatic abscess drain placement 1/18>> thoracocentesis by IR 1/29.  Repeat CT scan. 1. Persistent 6.7 x 10.1 cm subcapsular fluid collection in the right liver with air-fluid level, layering density, and a pigtail drainage catheter. No significant decrease in size since previous study. Presence of contrast material in the cavity could indicate communication with the bowel or recent contrast injection of the tube 1/29.  PICC line and TNA requested by general surgery.   Consults: [facility] IR General surgery ID Psychiatry    Subjective: Patient in bed, appears  comfortable, denies any headache, no fever, no chest pain or pressure, no shortness of breath, improved abdominal pain. No new focal weakness.    Assessment and Plan:  Right hepatic abscess secondary to contained gastric perforation with fistulous communication Contained gastric perforation with fistulous communication with right hepatic abscess S/p CT-guided drain placement by IR on 1/18, upsized on 09/05/2024. Currently on Unasyn  and micafungin  combination Upper GI series shows esophageal distention concerning for achalasia and no evidence of leakage of contrast.  Tolerating soft diet ID, general surgery and IR following, case discussed with ID as well as general surgery again on 09/07/2024.  CT abdomen pelvis with contrast shows persistent abscess.  Her drain was upsized by IR on 09/05/2024. Still has low-grade fevers IR and general surgery following, follow repeat cultures defer management to ID, IR and general surgery. Repeat CT scan noted on 09/08/2024 discussed with general surgery in detail, will require surgical intervention after nutritional status is optimized. Now has a PICC line placed on TNA to optimize her nutritional status per general surgery.  General surgery contemplating surgical intervention around 09/16/2024.  Patient initially declined PICC line but has agreed upon counseling on 09/09/2024 to obtain PICC line, TNA and then undergo surgical correction next week.  Note patient has left AMA multiple times before, no family, appears to have poor insight into her illness, multiple refusals on treatments and procedures, will get psych input to evaluate her capacity, may require IVC as well.  Seen by the psychiatry team, they recommend IVC if patient requires continued medical treatment in the hospital setting, she does will IVC her.   Extreme agitation.  Placed on Zyprexa  by psych,  improvement in agitation.  Recent upper GI bleed (EGD on 1/3 showed candidiasis and gastric  ulcer) Completed a course of fluconazole  On PPI twice daily-6-8 weeks   Right-sided  pleural effusion S/p thoracocentesis on 1/18 Exudative by lights criteria-however cultures negative This is felt to be sympathetic effusion to hepatic abscess On empiric antibiotics as above   HTN BP stable-not on any antihypertensives   EtOH use Apparently has quit drinking-no withdrawal symptoms   History of substance abuse (UDS positive for benzo/opiates/cocaine on 1/4) Will counsel the patient prior to discharge   Noncompliance Continues to have poor insight in her medical condition. She has started to leave AMA multiple times. Multiple providers have counseled her again and again regarding the importance of current management. Multiple providers including myself have advised her of the life-threatening and life disabling risk of leaving AGAINST MEDICAL ADVICE. Now under IVC.   Hypertension.  Placed on Norvasc  and as needed hydralazine .    Class 1 Obesity: Body mass index is 30.9 kg/m.  Placing the patient at high risk for him.   Normocytic acute on chronic blood loss anemia. Hemoglobin appears to be stable around 9.  Acute pain control. Patient reports severe pain in her abdomen. Currently able to tolerate oral soft diet but at the same time reports severe pain in her drain area. Continue current regimen of oxycodone  5 mg and 10 mg with Dilaudid  0.5 mg for refractory pain and muscle relaxer.   DVT Prophylaxis: SCDs Start: 08/27/24 2352 Place TED hose Start: 08/27/24 2352   Data review I have Reviewed nursing notes, Vitals, and Lab results. Since last encounter, pertinent lab results  BMP   . I have ordered test including CBC and BMP  . I have ordered imaging x-ray abdomen  .   Physical exam.  Vitals:   09/13/24 1456 09/13/24 1700 09/13/24 1956 09/14/24 0500  BP: (!) 148/84 126/85 123/78   Pulse: 82 78 80   Resp: 16 16 20    Temp:  98.4 F (36.9 C) 99 F (37.2 C)    TempSrc:  Oral Oral   SpO2: 100% 95% 95%   Weight:    81.2 kg  Height:        Awake Alert, No new F.N deficits, less agitated Arvin.AT,PERRAL Supple Neck, No JVD,   Symmetrical Chest wall movement, Good air movement bilaterally, CTAB RRR,No Gallops, Rubs or new Murmurs,  +ve B.Sounds, Abd Soft, right upper quadrant tenderness, right sided abdominal drain in place No Cyanosis, Clubbing or edema   Family Communication: No one at bedside.  Disposition Plan: Status is: Inpatient Remains inpatient appropriate because: Currently on IV antibiotic.  Monitor for resolution of abscess with eventual drain removal.   Planned Discharge Destination: TBD Diet: Diet Order             Diet clear liquid Fluid consistency: Thin  Diet effective now                    Data Review:   Patient Lines/Drains/Airways Status     Active Line/Drains/Airways     Name Placement date Placement time Site Days   PICC Double Lumen 09/09/24 Right Brachial 37 cm 0 cm 09/09/24  1012  -- 5   Closed System Drain 1 RLQ Bulb (JP) 16 Fr. 09/05/24  1142  RLQ  9             Inpatient Medications  Scheduled Meds:  amLODipine   10 mg Oral Daily   Chlorhexidine  Gluconate Cloth  6 each Topical Daily   feeding supplement  1 Container Oral Q24H   methocarbamol  (ROBAXIN ) injection  1,000 mg Intravenous Q8H   OLANZapine   5  mg Oral BID   pantoprazole  (PROTONIX ) IV  40 mg Intravenous Q12H   sodium chloride  flush  10-40 mL Intracatheter Q12H   traMADol   50 mg Oral Q6H   Continuous Infusions:  ampicillin -sulbactam (UNASYN ) IV 3 g (09/14/24 0252)   micafungin  (MYCAMINE ) 100 mg in sodium chloride  0.9 % 100 mL IVPB 100 mg (09/13/24 1017)   TPN ADULT (ION) 75 mL/hr at 09/13/24 1738   PRN Meds:.hydrALAZINE , HYDROcodone -acetaminophen , HYDROmorphone  (DILAUDID ) injection, LORazepam  **OR** LORazepam , melatonin, OLANZapine  **OR** OLANZapine , sodium chloride  flush  DVT Prophylaxis  SCDs Start: 08/27/24 2352 Place  TED hose Start: 08/27/24 2352  Recent Labs  Lab 09/09/24 0619 09/11/24 0538 09/13/24 0238 09/14/24 0345  WBC 5.8 5.8 5.7 5.7  HGB 8.8* 8.2* 7.7* 8.5*  HCT 27.3* 25.8* 24.5* 27.5*  PLT 450* 422* 410* 542*  MCV 86.9 87.5 87.5 87.3  MCH 28.0 27.8 27.5 27.0  MCHC 32.2 31.8 31.4 30.9  RDW 15.0 15.4 15.5 15.8*  LYMPHSABS 0.9 1.3 1.1 1.5  MONOABS 0.9 0.3 1.1* 0.9  EOSABS 0.1 0.1 0.1 0.2  BASOSABS 0.0 0.1 0.0 0.0    Recent Labs  Lab 09/08/24 0242 09/09/24 0619 09/10/24 0356 09/11/24 0538 09/12/24 0138 09/13/24 0238 09/14/24 0345  NA 138   < > 142 141 141 138 137  K 3.9   < > 3.7 3.9 3.8 4.3 4.7  CL 103   < > 106 107 105 106 103  CO2 24   < > 26 25 25 24 23   ANIONGAP 11   < > 9 9 12 8 10   GLUCOSE 78   < > 107* 120* 111* 116* 99  BUN 6*   < > 6* 6* 6* 7* 8  CREATININE 0.84   < > 0.68 0.60 0.51 0.49 0.53  AST 17  --   --  13* 16  --  19  ALT 13  --   --  10 11  --  12  ALKPHOS 83  --   --  93 89  --  94  BILITOT 0.4  --   --  0.3 0.2  --  0.3  ALBUMIN  2.5*  --   --  2.5* 2.6*  --  2.9*  CRP  --   --   --  11.1*  --   --   --   PROCALCITON  --   --   --  2.19  --   --   --   INR  --   --   --   --   --  1.2  --   MG 1.8  --  2.0 1.8 2.3  --  2.2  PHOS 2.8  --  2.1* 2.5 2.6 2.8 3.3  CALCIUM  8.7*   < > 8.7* 8.7* 8.6* 8.7* 9.4   < > = values in this interval not displayed.      Recent Labs  Lab 09/08/24 0242 09/09/24 9380 09/10/24 0356 09/11/24 0538 09/12/24 0138 09/13/24 0238 09/14/24 0345  CRP  --   --   --  11.1*  --   --   --   PROCALCITON  --   --   --  2.19  --   --   --   INR  --   --   --   --   --  1.2  --   MG 1.8  --  2.0 1.8 2.3  --  2.2  CALCIUM  8.7*   < > 8.7* 8.7* 8.6*  8.7* 9.4   < > = values in this interval not displayed.    --------------------------------------------------------------------------------------------------------------- Lab Results  Component Value Date   CHOL 181 05/24/2024   HDL 68 05/24/2024   LDLCALC 98 05/24/2024    TRIG 80 09/12/2024   CHOLHDL 2.7 05/24/2024    Lab Results  Component Value Date   HGBA1C 5.7 (H) 07/14/2024   No results for input(s): TSH, T4TOTAL, FREET4, T3FREE, THYROIDAB in the last 72 hours. No results for input(s): VITAMINB12, FOLATE, FERRITIN, TIBC, IRON, RETICCTPCT in the last 72 hours. ------------------------------------------------------------------------------------------------------------------ Cardiac Enzymes No results for input(s): CKMB, TROPONINI, MYOGLOBIN in the last 168 hours.  Invalid input(s): CK  Micro Results Recent Results (from the past 240 hours)  Culture, blood (Routine X 2) w Reflex to ID Panel     Status: None   Collection Time: 09/06/24  2:41 AM   Specimen: BLOOD LEFT HAND  Result Value Ref Range Status   Specimen Description BLOOD LEFT HAND  Final   Special Requests   Final    BOTTLES DRAWN AEROBIC AND ANAEROBIC Blood Culture adequate volume   Culture   Final    NO GROWTH 5 DAYS Performed at [facility], [address], [place], [place] [address]    Report Status 09/11/2024 FINAL  Final  Culture, blood (Routine X 2) w Reflex to ID Panel     Status: None   Collection Time: 09/06/24  2:47 AM   Specimen: BLOOD RIGHT HAND  Result Value Ref Range Status   Specimen Description BLOOD RIGHT HAND  Final   Special Requests   Final    BOTTLES DRAWN AEROBIC AND ANAEROBIC Blood Culture results may not be optimal due to an inadequate volume of blood received in culture bottles   Culture   Final    NO GROWTH 5 DAYS Performed at [facility], [address], [place], [place] [address]    Report Status 09/11/2024 FINAL  Final    Radiology Reports  DG Abd Portable 1V Result Date: 09/12/2024 CLINICAL DATA:  Abdominal pain EXAM: PORTABLE ABDOMEN - 1 VIEW COMPARISON:  09/06/2024, 09/07/2024 FINDINGS: Two supine frontal views of the abdomen and pelvis are obtained. Percutaneous cholecystostomy tube is coiled over the  lateral aspect of the right upper quadrant, and appears to have been withdrawn 6-7 cm in the interim. Please correlate with catheter function. There is a relative paucity of bowel gas. No evidence of high-grade bowel obstruction or ileus. Persistent right basilar consolidation and effusion. IMPRESSION: 1. Percutaneous cholecystostomy tube overlying the lateral aspect of the right upper quadrant, and a more lateral position since prior imaging studies. This may have been withdrawn 6-7 cm in the interim, and correlation with catheter function is recommended. 2. Paucity of bowel gas.  No evidence of high-grade obstruction. Electronically Signed   By: Ozell Daring M.D.   On: 09/12/2024 16:42     Signature  -   Lavada Stank M.D on 09/14/2024 at 7:26 AM   -  To page go to [email or web address]

## 2024-09-14 NOTE — Progress Notes (Signed)
 PHARMACY - TOTAL PARENTERAL NUTRITION CONSULT NOTE   Indication: Gastric fistula  Patient Measurements: Height: 5' 4 (162.6 cm) Weight: 81.2 kg (179 lb 0.2 oz) IBW/kg (Calculated) : 54.7 TPN AdjBW (KG): 61.4 Body mass index is 30.73 kg/m. Usual Weight: 180-182 lbs  Assessment:  64 yo woman with admitted on 1/2 and found to have gastric ulcer, gastritis and esophageal candidiasis on EGD. Patient left AMA 1/3 due to child care and returned same day with continued abdominal pain and dark stools. Patient developed AKI and was eventually discharged home 1/12. Patient called EMS on 1/17 for black tarry stools, vomiting and weakness. CT found large perihepatic abscess secondary to fistulous connection from ruptured gastric ulcer, which was been persistent despite drain placement and drain upsizing. 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 Patient tolerated dysphagia 3 diet well and likes Boost Breeze supplements. Patient was NPO again 1/28 for CT.  On 1/28 Patient reports she ate broccoli last night and this morning. A few bits of broccoli florets are present in abscess drain bag. Pharmacy consulted for TPN.  1/30 Pt refused PICC line 1/29 so she did not get TPN but agreeable today to receive. Pending surgery next week. Surgery approved clear liquids for now.  2/3 Likely to go to OR this week for gastric perf w/ abscess   Glucose / Insulin : A1C 5.7. BG < 150, off sliding scale  Electrolytes: K 4.7 (trending up), CoCa 10.3, phos 3.3, Mag 2.2, others wnl Renal: Scr 0.53, BUN 8 Hepatic: LFTs WNL, albumin  2.9, TG 80 2/2 Intake / Output; MIVF: UOP x3 charted, 110 mL drain output   GI Imaging: 1/17 CT: large R hepatic subcapsular abscess with fistulous communication to gastric antrum  1/22 UGI: esophageal stasis/achalasia, delayed gastric emptying, 1/25 CT: subcapsular abscess in anterior liver, drain  in collection 1/27 KUB: Paucity of bowel gas throughout the abdomen  1/28 CT: persistent subcapsular fluid abscess, unchanged  2/2 Ab Xray: Perc chole tube may have been withdrawn 6-7 cm. No evidence of high-grade obstruction  GI Surgeries / Procedures:  1/18 R perihepatic abscess drain placement  1/26 drain upsize  Central access: PICC 1/30 TPN start date: 1/30  Nutritional Goals: Goal TPN rate is 75 mL/hr (provides 104 g of protein and 1753 kcals per day)  RD Assessment: Estimated Needs Total Energy Estimated Needs: 1700-1900 Total Protein Estimated Needs: 90-105 grams Total Fluid Estimated Needs: 1.7-1.9 L  Current Nutrition:  Clear liquid diet + TPN   Plan:  Continue TPN at goal 75 mL/hr at 1800, provides 100% estimated needs   Electrolytes in TPN: Na 100 mEq/L, K 35 mEq/L, Ca 0 mEq/L, Mg 8 mEq/L, Phos 15 mmol/L. Cl:Ac 1:1  Continue standard MVI and trace elements to TPN Monitor TPN labs daily until stable at goal then on Mon/Thurs --f/u plan surgical intervention per gen surg team  Rankin Sams, PharmD, BCCCP Clinical Pharmacist

## 2024-09-14 NOTE — Consult Note (Signed)
 "  NAME:  Jennifer [name], MRN:  [id number], DOB:  01/31/1961, LOS: 18 ADMISSION DATE:  08/27/2024, CONSULTATION DATE:  2/5 REFERRING MD: General Surgery, CHIEF COMPLAINT: Medical consult  History of Present Illness:  Jennifer [name] is a 64 year old female with a past medical history significant for GI bleed with recent admission 1/3 patient left AMA prior to completion of treatment, HTN, alcohol use, polysubstance abuse, type I bipolar disease, thrombocytopenia, and depression who presented to the ED at [facility] 1/17 for complaints of melena and weakness.  CT abdomen pelvis revealed a large right hepatic subcapsular abscess with with moderate-sized right effusion.  Patient was admitted per hospitalist.  See below for pertinent hospital events.  Pertinent  Medical History  GI bleed with recent admission 1/3 patient left AMA prior to completion of treatment, HTN, alcohol use, polysubstance abuse, type I bipolar disease, thrombocytopenia, and depression   Significant Hospital Events: Including procedures, antibiotic start and stop dates in addition to other pertinent events   1/17 presented with melena and weakness CT with large right hepatic subcapsular abscess admitted per [facility] 1/18 General Surgery consulted given CT images concern for perforated gastric ulcer plan to treat conservatively.  IR placed CT-guided perihepatic abscess and drain placement and preformed right thora  1/19 ID consulted, Tmax 101.1 1/20 increasing agitation  1/22 UGI today was suspicious for achalasia, with minimal contrast passage into stomach, so contrast extravasation could not be assessed. Clear liquid diet started  1/23 diet advanced to full liquid  1/25 Soft diet  1/26 Repeat CT with persistent large perihepatic collection, but this does not appear to communicate with the stomach. There is no perigastric free fluid or air to suggest on an ongoing gastric leak, and the patient remains clinically stable. Will consult  IR for drain upsize vs placement of an additional drain into the perihepatic abscess >IR upsized drain  1/29 CT confirms suspicion of gastric perforation contained within this abscess cavity now. Her nutrition is poor. She will need an operation likely next week for distal gastrectomy and evacuation of this abscess  1/30 PICC placed and TPN started. Patient IVC'd 1/31 Psych formally consulted and determined patient does not have capacity to made medical decisions   Interim History / Subjective:  Seen sleeping on my arrival but on waking complaints of severe pain despite PCA pump   Objective    Blood pressure 123/78, pulse 80, temperature 98.8 F (37.1 C), temperature source Oral, resp. rate 18, height 5' 4 (1.626 m), weight 81.2 kg, last menstrual period 12/04/2009, SpO2 95%.        Intake/Output Summary (Last 24 hours) at 09/14/2024 1006 Last data filed at 09/14/2024 0000 Gross per 24 hour  Intake 405 ml  Output 60 ml  Net 345 ml   Filed Weights   08/27/24 1637 09/14/24 0500  Weight: 81.6 kg 81.2 kg    Examination: General: Acute on chronically ill appearing middle aged female lying in bed, in NAD HEENT: [place]/AT, MM pink/moist, PERRL,  Neuro: Alert and oriented, confused to situation  CV: s1s2 regular rate and rhythm, no murmur, rubs, or gallops,  PULM:  Clear to auscultation, no increased work of breathing, no added breath sounds  GI: soft, bowel sounds active in all 4 quadrants, non-tender, non-distended, tolerating TPN Extremities: warm/dry, no edema  Skin: no rashes or lesions  Resolved problem list   Assessment and Plan  Right hepatic abscess secondary to contained gastric perforation with fistulous communication  - Several attempts have  been made medical management including CT scan to multiple pleural percutaneous soft tissue symptoms most recent upsize with her on 1/28.  Despite medical management and repeat CT continue to remain suspicious for gastric perforation within  the abscess, this prompted decision to precede with partial gastrectomy  P: Primary management per General Surgery Strict NPO  Multimodal pain control  Continue TPN   Noncompliance Patient currently does not have capacity to made medical decisions -Per chart review multiple providers have counseled patient regarding the importance of current management despite this she has left AMA several times.  -Psych was consulted 1/31 and on their evaluation patient did not have capacity to make medical decision.  P: Currently IVC'd through 2/7  Right pleural effusion  - S/p thoracentesis 1/18, exudative by lights criteria.  Cultures remain negative felt secondary to sympathetic effusion due to hepatic abscess P: Ongoing management of hepatic abscess as above  Essential hypertension P: Continuous telemetry As needed IV antihypertensives  Alcohol use Polysubstance abuse - 18 days into admission thus far, no clinical signs of withdrawal P: Supplement thiamine , folate, multivitamin  Normocytic anemia- P: Trend CBC Transfuse per protocol Hemoglobin goal greater than 7 Medical decision making capacity  Hypoalbuminemia P: TPN as above  RD following    Labs   CBC: Recent Labs  Lab 09/09/24 0619 09/11/24 0538 09/13/24 0238 09/14/24 0345  WBC 5.8 5.8 5.7 5.7  NEUTROABS 4.0 4.0 3.4 3.0  HGB 8.8* 8.2* 7.7* 8.5*  HCT 27.3* 25.8* 24.5* 27.5*  MCV 86.9 87.5 87.5 87.3  PLT 450* 422* 410* 542*    Basic Metabolic Panel: Recent Labs  Lab 09/08/24 0242 09/09/24 0619 09/10/24 0356 09/11/24 0538 09/12/24 0138 09/13/24 0238 09/14/24 0345  NA 138   < > 142 141 141 138 137  K 3.9   < > 3.7 3.9 3.8 4.3 4.7  CL 103   < > 106 107 105 106 103  CO2 24   < > 26 25 25 24 23   GLUCOSE 78   < > 107* 120* 111* 116* 99  BUN 6*   < > 6* 6* 6* 7* 8  CREATININE 0.84   < > 0.68 0.60 0.51 0.49 0.53  CALCIUM  8.7*   < > 8.7* 8.7* 8.6* 8.7* 9.4  MG 1.8  --  2.0 1.8 2.3  --  2.2  PHOS 2.8  --   2.1* 2.5 2.6 2.8 3.3   < > = values in this interval not displayed.   GFR: Estimated Creatinine Clearance: 76.1 mL/min (by C-G formula based on SCr of 0.53 mg/dL). Recent Labs  Lab 09/09/24 0619 09/11/24 0538 09/13/24 0238 09/14/24 0345  PROCALCITON  --  2.19  --   --   WBC 5.8 5.8 5.7 5.7    Liver Function Tests: Recent Labs  Lab 09/08/24 0242 09/11/24 0538 09/12/24 0138 09/14/24 0345  AST 17 13* 16 19  ALT 13 10 11 12   ALKPHOS 83 93 89 94  BILITOT 0.4 0.3 0.2 0.3  PROT 6.7 6.6 6.6 7.7  ALBUMIN  2.5* 2.5* 2.6* 2.9*   No results for input(s): LIPASE, AMYLASE in the last 168 hours. No results for input(s): AMMONIA in the last 168 hours.  ABG    Component Value Date/Time   HCO3 23.7 09/25/2014 1058   TCO2 25 02/11/2016 1137   ACIDBASEDEF 2.0 09/25/2014 1058   O2SAT 72.0 09/25/2014 1058     Coagulation Profile: Recent Labs  Lab 09/13/24 0238  INR 1.2  Cardiac Enzymes: No results for input(s): CKTOTAL, CKMB, CKMBINDEX, TROPONINI in the last 168 hours.  HbA1C: Hgb A1c MFr Bld  Date/Time Value Ref Range Status  07/14/2024 10:07 AM 5.7 (H) 4.8 - 5.6 % Final    Comment:             Prediabetes: 5.7 - 6.4          Diabetes: >6.4          Glycemic control for adults with diabetes: <7.0   02/06/2009 09:22 AM 5.8 %     CBG: Recent Labs  Lab 09/09/24 0042 09/09/24 0508 09/10/24 1148 09/11/24 0116 09/11/24 1224  GLUCAP 87 79 118* 109* 136*    Review of Systems:   Unable to assess   Past Medical History:  She,  has a past medical history of bipolar, Bipolar 1 disorder (HCC), Depression, Fibroids, History of alcohol use (08/27/2024), Hypertension, Left ventricular hypertrophy, and Polysubstance abuse (HCC).   Surgical History:   Past Surgical History:  Procedure Laterality Date   ABDOMINAL HYSTERECTOMY     2003   BILATERAL SALPINGOOPHORECTOMY     01/2010   BONE BIOPSY  08/13/2024   Procedure: BIOPSY, GI;  Surgeon: Dianna Specking,  MD;  Location: [facility];  Service: Gastroenterology;;   DILATION AND CURETTAGE OF UTERUS     ESOPHAGOGASTRODUODENOSCOPY N/A 08/13/2024   Procedure: EGD (ESOPHAGOGASTRODUODENOSCOPY);  Surgeon: Dianna Specking, MD;  Location: [facility];  Service: Gastroenterology;  Laterality: N/A;   EXPLORATORY LAPAROTOMY     complex pelvic mass 2011   IR CATHETER TUBE CHANGE  09/05/2024   TONSILLECTOMY       Social History:   reports that she has been smoking cigarettes. She has a 20 pack-year smoking history. She has never used smokeless tobacco. She reports current alcohol use. She reports that she does not currently use drugs after having used the following drugs: Cocaine and Marijuana.   Family History:  Her family history includes Diabetes in her father; Hypertension in her mother and sister.   Allergies Allergies[1]   Home Medications  Prior to Admission medications  Medication Sig Start Date End Date Taking? Authorizing Provider  [Paused] amLODipine  (NORVASC ) 10 MG tablet TAKE 1 TABLET (10 MG TOTAL) BY MOUTH DAILY (Office visit for future refills) Wait to take this until your doctor or other care provider tells you to start again. 07/14/24   Newlin, Enobong, MD  atorvastatin  (LIPITOR) 10 MG tablet Take 1 tablet (10 mg total) by mouth daily. Patient not taking: Reported on 08/28/2024 07/14/24   Newlin, Enobong, MD  Blood Pressure Monitoring (BLOOD PRESSURE CUFF) MISC 1 Units by Does not apply route daily. 05/24/24   Mayers, Cari S, PA-C  dicyclomine  (BENTYL ) 20 MG tablet Take 1 tablet (20 mg total) by mouth 3 (three) times daily as needed for spasms. Patient not taking: Reported on 08/28/2024 08/22/24 09/21/24  Christobal Guadalajara, MD  folic acid  (FOLVITE ) 1 MG tablet Take 1 tablet (1 mg total) by mouth daily. Patient not taking: Reported on 08/28/2024 08/22/24 09/21/24  Christobal Guadalajara, MD  [Paused] lisinopril -hydrochlorothiazide  (ZESTORETIC ) 20-12.5 MG tablet Take 2 tablets by mouth daily. Wait to take this  until your doctor or other care provider tells you to start again. 07/14/24   Newlin, Enobong, MD  metoprolol  succinate (TOPROL -XL) 50 MG 24 hr tablet Take 1 tablet (50 mg total) by mouth daily. Patient not taking: Reported on 08/28/2024 07/14/24   Newlin, Enobong, MD  oxyCODONE -acetaminophen  (PERCOCET/ROXICET) 5-325 MG tablet Take 1 tablet  by mouth every 6 (six) hours as needed for moderate pain (pain score 4-6). Patient not taking: Reported on 08/28/2024 08/22/24   Christobal Guadalajara, MD  pantoprazole  (PROTONIX ) 40 MG tablet Take 1 tablet (40 mg total) by mouth 2 (two) times daily. Patient not taking: Reported on 08/28/2024 08/22/24 09/21/24  Christobal Guadalajara, MD  thiamine  (VITAMIN B-1) 100 MG tablet Take 1 tablet (100 mg total) by mouth daily. Patient not taking: Reported on 08/28/2024 08/22/24 09/21/24  Christobal Guadalajara, MD     Critical care time: NA   Darus Hershman D. Harris, NP-C [facility] Personal contact information can be found on Amion  If no contact or response made please call [phone] 09/15/2024, 5:15 PM             [1] No Known Allergies  "

## 2024-09-14 NOTE — TOC Progression Note (Signed)
 Transition of Care [facility]) - Progression Note    Patient Details  Name: Jennifer [name] MRN: [id number] Date of Birth: 28-Aug-1960  [facility] CM/SW Contact  Inocente GORMAN Kindle, LCSW Phone Number: 09/14/2024, 8:45 AM  Clinical Narrative:    Patient remains IVC'd for medical treatment.    Expected Discharge Plan: Home/Self Care Barriers to Discharge: Continued Medical Work up               Expected Discharge Plan and Services       Living arrangements for the past 2 months: Single Family Home                                       Social Drivers of Health (SDOH) Interventions SDOH Screenings   Food Insecurity: No Food Insecurity (08/29/2024)  Housing: Low Risk (08/29/2024)  Transportation Needs: No Transportation Needs (08/29/2024)  Recent Concern: Transportation Needs - Unmet Transportation Needs (08/14/2024)  Utilities: Not At Risk (08/29/2024)  Depression (PHQ2-9): Low Risk (05/24/2024)  Recent Concern: Depression (PHQ2-9) - Medium Risk (03/04/2024)  Tobacco Use: High Risk (08/28/2024)    Readmission Risk Interventions     No data to display

## 2024-09-14 NOTE — Plan of Care (Signed)

## 2024-09-15 ENCOUNTER — Encounter (HOSPITAL_COMMUNITY): Admission: EM | Payer: Self-pay | Source: Home / Self Care | Attending: Internal Medicine

## 2024-09-15 ENCOUNTER — Encounter (HOSPITAL_COMMUNITY): Payer: Self-pay | Admitting: Anesthesiology

## 2024-09-15 ENCOUNTER — Encounter (HOSPITAL_COMMUNITY): Payer: Self-pay | Admitting: Internal Medicine

## 2024-09-15 LAB — MAGNESIUM: Magnesium: 2.2 mg/dL (ref 1.7–2.4)

## 2024-09-15 LAB — COMPREHENSIVE METABOLIC PANEL WITH GFR
ALT: 14 U/L (ref 0–44)
AST: 19 U/L (ref 15–41)
Albumin: 2.8 g/dL — ABNORMAL LOW (ref 3.5–5.0)
Alkaline Phosphatase: 87 U/L (ref 38–126)
Anion gap: 9 (ref 5–15)
BUN: 9 mg/dL (ref 8–23)
CO2: 25 mmol/L (ref 22–32)
Calcium: 9.4 mg/dL (ref 8.9–10.3)
Chloride: 104 mmol/L (ref 98–111)
Creatinine, Ser: 0.56 mg/dL (ref 0.44–1.00)
GFR, Estimated: >60 mL/min
Glucose, Bld: 109 mg/dL — ABNORMAL HIGH (ref 70–99)
Potassium: 4.7 mmol/L (ref 3.5–5.1)
Sodium: 138 mmol/L (ref 135–145)
Total Bilirubin: 0.2 mg/dL (ref 0.0–1.2)
Total Protein: 7.4 g/dL (ref 6.5–8.1)

## 2024-09-15 LAB — PHOSPHORUS: Phosphorus: 3.8 mg/dL (ref 2.5–4.6)

## 2024-09-15 MED ORDER — DIPHENHYDRAMINE HCL 50 MG/ML IJ SOLN: 12.5000 mg | Freq: Four times a day (QID) | INTRAMUSCULAR | Status: AC | PRN

## 2024-09-15 MED ORDER — HYDROMORPHONE HCL 1 MG/ML IJ SOLN
0.2500 mg | INTRAMUSCULAR | Status: DC | PRN
  Administered 2024-09-15: 0.5 mg via INTRAVENOUS

## 2024-09-15 MED ORDER — HYDROMORPHONE HCL 1 MG/ML IJ SOLN
INTRAMUSCULAR | Status: AC
  Filled 2024-09-15: qty 1

## 2024-09-15 MED ORDER — LACTATED RINGERS IV SOLN: INTRAVENOUS | Status: DC

## 2024-09-15 MED ORDER — DEXAMETHASONE SOD PHOSPHATE PF 10 MG/ML IJ SOLN
INTRAMUSCULAR | Status: AC
  Filled 2024-09-15: qty 1

## 2024-09-15 MED ORDER — PHENYLEPHRINE HCL-NACL 20-0.9 MG/250ML-% IV SOLN
INTRAVENOUS | Status: DC | PRN
  Administered 2024-09-15: 40 ug/min via INTRAVENOUS

## 2024-09-15 MED ORDER — HYDROMORPHONE 1 MG/ML IV SOLN
INTRAVENOUS | Status: DC
  Administered 2024-09-15: 30 mg via INTRAVENOUS
  Administered 2024-09-15: 6.5 mg via INTRAVENOUS
  Filled 2024-09-15 (×2): qty 30

## 2024-09-15 MED ORDER — KETAMINE HCL 50 MG/5ML IJ SOSY
PREFILLED_SYRINGE | INTRAMUSCULAR | Status: DC | PRN
  Administered 2024-09-15: 20 mg via INTRAVENOUS

## 2024-09-15 MED ORDER — FENTANYL CITRATE (PF) 100 MCG/2ML IJ SOLN
50.0000 ug | Freq: Once | INTRAMUSCULAR | Status: AC
  Administered 2024-09-15: 50 ug via INTRAVENOUS

## 2024-09-15 MED ORDER — ONDANSETRON HCL 4 MG/2ML IJ SOLN
INTRAMUSCULAR | Status: DC | PRN
  Administered 2024-09-15: 4 mg via INTRAVENOUS

## 2024-09-15 MED ORDER — FENTANYL CITRATE (PF) 100 MCG/2ML IJ SOLN
INTRAMUSCULAR | Status: AC
  Filled 2024-09-15: qty 2

## 2024-09-15 MED ORDER — HYDROMORPHONE HCL 1 MG/ML IJ SOLN
INTRAMUSCULAR | Status: AC
  Filled 2024-09-15: qty 0.5

## 2024-09-15 MED ORDER — IPRATROPIUM-ALBUTEROL 0.5-2.5 (3) MG/3ML IN SOLN
RESPIRATORY_TRACT | Status: AC
  Filled 2024-09-15: qty 3

## 2024-09-15 MED ORDER — METHOCARBAMOL 1000 MG/10ML IJ SOLN
1000.0000 mg | Freq: Three times a day (TID) | INTRAMUSCULAR | Status: AC
  Administered 2024-09-15 – 2024-09-16 (×4): 1000 mg via INTRAVENOUS
  Filled 2024-09-15 (×4): qty 10

## 2024-09-15 MED ORDER — AMISULPRIDE (ANTIEMETIC) 5 MG/2ML IV SOLN: 10.0000 mg | Freq: Once | INTRAVENOUS | Status: DC | PRN

## 2024-09-15 MED ORDER — ACETAMINOPHEN 500 MG PO TABS: 1000.0000 mg | ORAL_TABLET | Freq: Once | ORAL | Status: DC

## 2024-09-15 MED ORDER — TRAVASOL 10 % IV SOLN
INTRAVENOUS | Status: AC
  Filled 2024-09-15: qty 1044

## 2024-09-15 MED ORDER — MORPHINE SULFATE 1 MG/ML IV SOLN PCA: INTRAVENOUS | Status: DC

## 2024-09-15 MED ORDER — BUPIVACAINE HCL (PF) 0.25 % IJ SOLN
INTRAMUSCULAR | Status: AC
  Filled 2024-09-15: qty 30

## 2024-09-15 MED ORDER — FENTANYL CITRATE (PF) 250 MCG/5ML IJ SOLN
INTRAMUSCULAR | Status: AC
  Filled 2024-09-15: qty 5

## 2024-09-15 MED ORDER — FENTANYL CITRATE (PF) 250 MCG/5ML IJ SOLN
INTRAMUSCULAR | Status: DC | PRN
  Administered 2024-09-15 (×3): 50 ug via INTRAVENOUS
  Administered 2024-09-15: 100 ug via INTRAVENOUS

## 2024-09-15 MED ORDER — PROPOFOL 10 MG/ML IV BOLUS
INTRAVENOUS | Status: AC
  Filled 2024-09-15: qty 20

## 2024-09-15 MED ORDER — 0.9 % SODIUM CHLORIDE (POUR BTL) OPTIME
TOPICAL | Status: DC | PRN
  Administered 2024-09-15: 1000 mL
  Administered 2024-09-15 (×2): 2000 mL

## 2024-09-15 MED ORDER — ORAL CARE MOUTH RINSE: 15.0000 mL | Freq: Once | OROMUCOSAL | Status: AC

## 2024-09-15 MED ORDER — DIPHENHYDRAMINE HCL 12.5 MG/5ML PO ELIX: 12.5000 mg | ORAL_SOLUTION | Freq: Four times a day (QID) | ORAL | Status: AC | PRN

## 2024-09-15 MED ORDER — MIDAZOLAM HCL 2 MG/2ML IJ SOLN
INTRAMUSCULAR | Status: AC
  Filled 2024-09-15: qty 2

## 2024-09-15 MED ORDER — METHADONE HCL 10 MG/ML IJ SOLN
5.0000 mg | Freq: Three times a day (TID) | INTRAMUSCULAR | Status: AC
  Administered 2024-09-15 – 2024-09-16 (×4): 5 mg via INTRAVENOUS
  Filled 2024-09-15 (×6): qty 0.5

## 2024-09-15 MED ORDER — ACETAMINOPHEN 10 MG/ML IV SOLN
1000.0000 mg | Freq: Four times a day (QID) | INTRAVENOUS | Status: AC
  Administered 2024-09-15 – 2024-09-16 (×4): 1000 mg via INTRAVENOUS
  Filled 2024-09-15 (×4): qty 100

## 2024-09-15 MED ORDER — CHLORHEXIDINE GLUCONATE 0.12 % MT SOLN
OROMUCOSAL | Status: AC
  Administered 2024-09-15: 15 mL via OROMUCOSAL
  Filled 2024-09-15: qty 15

## 2024-09-15 MED ORDER — ONDANSETRON HCL 4 MG/2ML IJ SOLN
INTRAMUSCULAR | Status: AC
  Filled 2024-09-15: qty 2

## 2024-09-15 MED ORDER — LIDOCAINE 2% (20 MG/ML) 5 ML SYRINGE
INTRAMUSCULAR | Status: AC
  Filled 2024-09-15: qty 5

## 2024-09-15 MED ORDER — PHENYLEPHRINE 80 MCG/ML (10ML) SYRINGE FOR IV PUSH (FOR BLOOD PRESSURE SUPPORT)
PREFILLED_SYRINGE | INTRAVENOUS | Status: DC | PRN
  Administered 2024-09-15: 80 ug via INTRAVENOUS

## 2024-09-15 MED ORDER — PROPOFOL 10 MG/ML IV BOLUS
INTRAVENOUS | Status: DC | PRN
  Administered 2024-09-15: 40 mg via INTRAVENOUS
  Administered 2024-09-15: 120 mg via INTRAVENOUS

## 2024-09-15 MED ORDER — KETAMINE HCL 50 MG/5ML IJ SOSY
PREFILLED_SYRINGE | INTRAMUSCULAR | Status: AC
  Filled 2024-09-15: qty 5

## 2024-09-15 MED ORDER — FENTANYL CITRATE (PF) 100 MCG/2ML IJ SOLN: 25.0000 ug | INTRAMUSCULAR | Status: DC | PRN

## 2024-09-15 MED ORDER — NALOXONE HCL 0.4 MG/ML IJ SOLN: 0.4000 mg | INTRAMUSCULAR | Status: AC | PRN

## 2024-09-15 MED ORDER — ROCURONIUM BROMIDE 10 MG/ML (PF) SYRINGE
PREFILLED_SYRINGE | INTRAVENOUS | Status: AC
  Filled 2024-09-15: qty 10

## 2024-09-15 MED ORDER — ALBUMIN HUMAN 5 % IV SOLN: INTRAVENOUS | Status: DC | PRN

## 2024-09-15 MED ORDER — HYDROMORPHONE HCL 1 MG/ML IJ SOLN
0.2500 mg | INTRAMUSCULAR | Status: DC | PRN
  Administered 2024-09-15 (×4): 0.5 mg via INTRAVENOUS

## 2024-09-15 MED ORDER — DEXAMETHASONE SOD PHOSPHATE PF 10 MG/ML IJ SOLN
INTRAMUSCULAR | Status: DC | PRN
  Administered 2024-09-15: 6 mg via INTRAVENOUS

## 2024-09-15 MED ORDER — STERILE WATER FOR IRRIGATION IR SOLN
Status: DC | PRN
  Administered 2024-09-15: 1000 mL

## 2024-09-15 MED ORDER — HYDROMORPHONE 1 MG/ML IV SOLN: INTRAVENOUS | Status: DC

## 2024-09-15 MED ORDER — ONDANSETRON HCL 4 MG/2ML IJ SOLN
4.0000 mg | Freq: Four times a day (QID) | INTRAMUSCULAR | Status: AC | PRN
  Administered 2024-09-15: 4 mg via INTRAVENOUS
  Filled 2024-09-15: qty 2

## 2024-09-15 MED ORDER — DEXMEDETOMIDINE HCL IN NACL 80 MCG/20ML IV SOLN
INTRAVENOUS | Status: DC | PRN
  Administered 2024-09-15: 20 ug via INTRAVENOUS

## 2024-09-15 MED ORDER — ROCURONIUM BROMIDE 10 MG/ML (PF) SYRINGE
PREFILLED_SYRINGE | INTRAVENOUS | Status: DC | PRN
  Administered 2024-09-15 (×2): 20 mg via INTRAVENOUS
  Administered 2024-09-15: 60 mg via INTRAVENOUS

## 2024-09-15 MED ORDER — OXYCODONE HCL 5 MG PO TABS: 5.0000 mg | ORAL_TABLET | Freq: Once | ORAL | Status: DC | PRN

## 2024-09-15 MED ORDER — LACTATED RINGERS IV SOLN: INTRAVENOUS | Status: AC

## 2024-09-15 MED ORDER — SUGAMMADEX SODIUM 200 MG/2ML IV SOLN
INTRAVENOUS | Status: DC | PRN
  Administered 2024-09-15: 200 mg via INTRAVENOUS

## 2024-09-15 MED ORDER — SODIUM CHLORIDE 0.9% FLUSH: 9.0000 mL | INTRAVENOUS | Status: AC | PRN

## 2024-09-15 MED ORDER — HYDROMORPHONE 1 MG/ML IV SOLN
INTRAVENOUS | Status: DC
  Administered 2024-09-15: 30 mg via INTRAVENOUS
  Administered 2024-09-16: 8.38 mg via INTRAVENOUS
  Administered 2024-09-16: 5.54 mg via INTRAVENOUS

## 2024-09-15 MED ORDER — MIDAZOLAM HCL (PF) 2 MG/2ML IJ SOLN
INTRAMUSCULAR | Status: DC | PRN
  Administered 2024-09-15: 2 mg via INTRAVENOUS

## 2024-09-15 MED ORDER — IPRATROPIUM-ALBUTEROL 0.5-2.5 (3) MG/3ML IN SOLN
3.0000 mL | Freq: Once | RESPIRATORY_TRACT | Status: AC
  Administered 2024-09-15: 3 mL via RESPIRATORY_TRACT

## 2024-09-15 MED ORDER — HYDROMORPHONE HCL 1 MG/ML IJ SOLN
INTRAMUSCULAR | Status: DC | PRN
  Administered 2024-09-15 (×2): .5 mg via INTRAVENOUS

## 2024-09-15 MED ORDER — CHLORHEXIDINE GLUCONATE 0.12 % MT SOLN: 15.0000 mL | Freq: Once | OROMUCOSAL | Status: AC

## 2024-09-15 MED ORDER — LIDOCAINE 2% (20 MG/ML) 5 ML SYRINGE
INTRAMUSCULAR | Status: DC | PRN
  Administered 2024-09-15: 60 mg via INTRAVENOUS

## 2024-09-15 MED ORDER — OXYCODONE HCL 5 MG/5ML PO SOLN: 5.0000 mg | Freq: Once | ORAL | Status: DC | PRN

## 2024-09-15 MED ORDER — DEXMEDETOMIDINE HCL IN NACL 400 MCG/100ML IV SOLN
0.0000 ug/kg/h | INTRAVENOUS | Status: AC
  Administered 2024-09-15: 0.4 ug/kg/h via INTRAVENOUS
  Administered 2024-09-16: 0.5 ug/kg/h via INTRAVENOUS
  Administered 2024-09-16: 0.7 ug/kg/h via INTRAVENOUS
  Administered 2024-09-16: 0.8 ug/kg/h via INTRAVENOUS
  Filled 2024-09-15 (×4): qty 100

## 2024-09-15 MED ORDER — HALOPERIDOL LACTATE 5 MG/ML IJ SOLN: 5.0000 mg | Freq: Four times a day (QID) | INTRAMUSCULAR | Status: DC | PRN

## 2024-09-15 NOTE — Progress Notes (Signed)
 Psychiatric Nurse Liaison (PNL) Rounding Note  Attempted to round on patient, pt not available, pt taken to OR for surgical procedure.

## 2024-09-15 NOTE — Progress Notes (Signed)
 Second call attempt. Nurse not available for report, secure chat sent with request to call short stay.

## 2024-09-15 NOTE — Plan of Care (Signed)

## 2024-09-15 NOTE — Progress Notes (Signed)
 [facility] Progress Note Patient: Jennifer [name] FMW:[id number] DOB: 04-24-1961  DOA: 08/27/2024 DOS: the patient was seen and examined on 09/15/2024  Brief Summary: Patient is a 64 y.o.  female with history of HTN, EtOH use, substance abuse (prior UDS positive for opiates/cocaine/benzos)-who recently had a admission for melena-EGD showed gastric ulcer-patient subsequently left AMA-presented with generalized weakness/melanotic stools/upper abdominal pain-she was found to have right hepatic abscess with fistulous communication to a contained gastric perforation.   Significant events: 1/17>> admit to [facility] 1/22>> underwent upper GI series.  Diet advanced. 1/25>> repeat CT abdomen pelvis with contrast shows persistent abscess. 1/26>> underwent upsizing of her drain with IR.  Developed fever after that.  Blood cultures ordered.  Remains on antibiotics.   Significant studies: 1/17>> CT abdomen/pelvis: Large right subhepatic subcapsular abscess with fistulous communication to the gastric antrum.  New moderate-sized right pleural effusion.  1/22>> upper GI series esophageal distention with severe esophageal dysmotility   Significant microbiology data: 1/18>> blood culture: No growth 1/18>> pleural fluid culture: No growth 1/18>> right hepatic abscess culture: Lactobacillus/rare yeast   Procedures: 1/18>> CT-guided right perihepatic abscess drain placement 1/18>> thoracocentesis by IR 1/29.  Repeat CT scan. 1. Persistent 6.7 x 10.1 cm subcapsular fluid collection in the right liver with air-fluid level, layering density, and a pigtail drainage catheter. No significant decrease in size since previous study. Presence of contrast material in the cavity could indicate communication with the bowel or recent contrast injection of the tube 1/29.  PICC line and TNA requested by general surgery.   Consults: [facility] IR General surgery ID Psychiatry    Subjective:   Patient reports she has  been compliant with n.p.o. status, asking for pain medicine this morning, otherwise denies any significant events overnight.     Assessment and Plan:  Right hepatic abscess secondary to contained gastric perforation with fistulous communication Contained gastric perforation with fistulous communication with right hepatic abscess S/p CT-guided drain placement by IR on 1/18, upsized on 09/05/2024. Currently on Unasyn  and micafungin  combination Upper GI series shows esophageal distention concerning for achalasia and no evidence of leakage of contrast.  Tolerating soft diet ID, general surgery and IR following. -CT abdomen pelvis with contrast shows persistent abscess.  Her drain was upsized by IR on 09/05/2024. -Now has a PICC line placed on TNA to optimize her nutritional status per general surgery. - CT confirms suspicion of gastric perforation contained within the abscess cavity now, plan distal gastrectomy BI vs BII and j-tube today by general surgery, she will need ICU admission after her operation per surgical team.  Note patient has left AMA multiple times before, no family, appears to have poor insight into her illness, multiple refusals on treatments and procedures, she was  psych input to evaluate her capacity, they recommend IVC if patient requires continued medical treatment in the hospital setting, she does will IVC her.   agitation.  - Placed on Zyprexa  by psych,  improvement in agitation.  Recent upper GI bleed (EGD on 1/3 showed candidiasis and gastric ulcer) Completed a course of fluconazole  On PPI twice daily-6-8 weeks   Right-sided pleural effusion S/p thoracocentesis on 1/18 Exudative by lights criteria-however cultures negative This is felt to be sympathetic effusion to hepatic abscess On empiric antibiotics as above   HTN BP stable-not on any antihypertensives   EtOH use Apparently has quit drinking-no withdrawal symptoms   History of substance abuse (UDS  positive for benzo/opiates/cocaine on 1/4) Will counsel the patient prior to  discharge   Noncompliance Continues to have poor insight in her medical condition. She has started to leave AMA multiple times. Multiple providers have counseled her again and again regarding the importance of current management. Multiple providers have advised her of the life-threatening and life disabling risk of leaving AGAINST MEDICAL ADVICE. Now under IVC.   Hypertension.  Placed on Norvasc  and as needed hydralazine .    Class 1 Obesity: Body mass index is 30.9 kg/m.  Placing the patient at high risk for him.   Normocytic acute on chronic blood loss anemia. Hemoglobin appears to be stable around 9.   DVT Prophylaxis: SCDs Start: 08/27/24 2352 Place TED hose Start: 08/27/24 2352   Data review I have Reviewed nursing notes, Vitals, and Lab results. Since last encounter, pertinent lab results  BMP   . I have ordered test including CBC and BMP  . I have ordered imaging x-ray abdomen  .   Physical exam.  Vitals:   09/14/24 0500 09/14/24 0751 09/14/24 1232 09/14/24 1639  BP:   132/89 136/83  Pulse:   88 88  Resp:  18 16 18   Temp:  98.8 F (37.1 C) 98.8 F (37.1 C) 99.2 F (37.3 C)  TempSrc:  Oral Oral Oral  SpO2:      Weight: 81.2 kg     Height:        Awake Alert, No new F.N deficits, less agitated [place].AT,PERRAL Supple Neck, No JVD,   Symmetrical Chest wall movement, Good air movement bilaterally, CTAB RRR,No Gallops, Rubs or new Murmurs,  +ve B.Sounds, Abd Soft, right upper quadrant drain present, tenderness in right upper quadrant and epigastric area present  No Cyanosis, Clubbing or edema   Family Communication: No one at bedside.  Disposition Plan: Status is: Inpatient Remains inpatient appropriate because: Currently on IV antibiotic.  Monitor for resolution of abscess with eventual drain removal.   Planned Discharge Destination: TBD Diet: Diet Order             Diet NPO  time specified  Diet effective now                    Data Review:   Patient Lines/Drains/Airways Status     Active Line/Drains/Airways     Name Placement date Placement time Site Days   PICC Double Lumen 09/09/24 Right Brachial 37 cm 0 cm 09/09/24  1012  -- 6   Closed System Drain 1 RLQ Bulb (JP) 16 Fr. 09/05/24  1142  RLQ  10             Inpatient Medications  Scheduled Meds:  amLODipine   10 mg Oral Daily   Chlorhexidine  Gluconate Cloth  6 each Topical Daily   feeding supplement  1 Container Oral Q24H   methocarbamol  (ROBAXIN ) injection  1,000 mg Intravenous Q8H   OLANZapine   5 mg Oral BID   pantoprazole  (PROTONIX ) IV  40 mg Intravenous Q12H   sodium chloride  flush  10-40 mL Intracatheter Q12H   traMADol   50 mg Oral Q6H   Continuous Infusions:  ampicillin -sulbactam (UNASYN ) IV 3 g (09/15/24 0452)   lactated ringers  75 mL/hr at 09/15/24 0656   micafungin  (MYCAMINE ) 100 mg in sodium chloride  0.9 % 100 mL IVPB Stopped (09/14/24 1132)   TPN ADULT (ION) 75 mL/hr at 09/14/24 1738   PRN Meds:.hydrALAZINE , HYDROcodone -acetaminophen , HYDROmorphone  (DILAUDID ) injection, LORazepam  **OR** LORazepam , melatonin, OLANZapine  **OR** OLANZapine , sodium chloride  flush  DVT Prophylaxis  SCDs Start: 08/27/24 2352 Place TED hose Start: 08/27/24  2352  Recent Labs  Lab 09/09/24 0619 09/11/24 0538 09/13/24 0238 09/14/24 0345  WBC 5.8 5.8 5.7 5.7  HGB 8.8* 8.2* 7.7* 8.5*  HCT 27.3* 25.8* 24.5* 27.5*  PLT 450* 422* 410* 542*  MCV 86.9 87.5 87.5 87.3  MCH 28.0 27.8 27.5 27.0  MCHC 32.2 31.8 31.4 30.9  RDW 15.0 15.4 15.5 15.8*  LYMPHSABS 0.9 1.3 1.1 1.5  MONOABS 0.9 0.3 1.1* 0.9  EOSABS 0.1 0.1 0.1 0.2  BASOSABS 0.0 0.1 0.0 0.0    Recent Labs  Lab 09/10/24 0356 09/11/24 0538 09/12/24 0138 09/13/24 0238 09/14/24 0345 09/15/24 0308  NA 142 141 141 138 137 138  K 3.7 3.9 3.8 4.3 4.7 4.7  CL 106 107 105 106 103 104  CO2 26 25 25 24 23 25   ANIONGAP 9 9 12 8 10 9    GLUCOSE 107* 120* 111* 116* 99 109*  BUN 6* 6* 6* 7* 8 9  CREATININE 0.68 0.60 0.51 0.49 0.53 0.56  AST  --  13* 16  --  19 19  ALT  --  10 11  --  12 14  ALKPHOS  --  93 89  --  94 87  BILITOT  --  0.3 0.2  --  0.3 0.2  ALBUMIN   --  2.5* 2.6*  --  2.9* 2.8*  CRP  --  11.1*  --   --   --   --   PROCALCITON  --  2.19  --   --   --   --   INR  --   --   --  1.2  --   --   MG 2.0 1.8 2.3  --  2.2 2.2  PHOS 2.1* 2.5 2.6 2.8 3.3 3.8  CALCIUM  8.7* 8.7* 8.6* 8.7* 9.4 9.4      Recent Labs  Lab 09/10/24 0356 09/11/24 0538 09/12/24 0138 09/13/24 0238 09/14/24 0345 09/15/24 0308  CRP  --  11.1*  --   --   --   --   PROCALCITON  --  2.19  --   --   --   --   INR  --   --   --  1.2  --   --   MG 2.0 1.8 2.3  --  2.2 2.2  CALCIUM  8.7* 8.7* 8.6* 8.7* 9.4 9.4    --------------------------------------------------------------------------------------------------------------- Lab Results  Component Value Date   CHOL 181 05/24/2024   HDL 68 05/24/2024   LDLCALC 98 05/24/2024   TRIG 80 09/12/2024   CHOLHDL 2.7 05/24/2024    Lab Results  Component Value Date   HGBA1C 5.7 (H) 07/14/2024   No results for input(s): TSH, T4TOTAL, FREET4, T3FREE, THYROIDAB in the last 72 hours. No results for input(s): VITAMINB12, FOLATE, FERRITIN, TIBC, IRON, RETICCTPCT in the last 72 hours. ------------------------------------------------------------------------------------------------------------------ Cardiac Enzymes No results for input(s): CKMB, TROPONINI, MYOGLOBIN in the last 168 hours.  Invalid input(s): CK  Micro Results Recent Results (from the past 240 hours)  Culture, blood (Routine X 2) w Reflex to ID Panel     Status: None   Collection Time: 09/06/24  2:41 AM   Specimen: BLOOD LEFT HAND  Result Value Ref Range Status   Specimen Description BLOOD LEFT HAND  Final   Special Requests   Final    BOTTLES DRAWN AEROBIC AND ANAEROBIC Blood Culture adequate  volume   Culture   Final    NO GROWTH 5 DAYS Performed at [facility], [address], [place], [place] [address]    Report Status 09/11/2024 FINAL  Final  Culture, blood (Routine X 2) w Reflex to ID Panel     Status: None   Collection Time: 09/06/24  2:47 AM   Specimen: BLOOD RIGHT HAND  Result Value Ref Range Status   Specimen Description BLOOD RIGHT HAND  Final   Special Requests   Final    BOTTLES DRAWN AEROBIC AND ANAEROBIC Blood Culture results may not be optimal due to an inadequate volume of blood received in culture bottles   Culture   Final    NO GROWTH 5 DAYS Performed at [facility], [address], [place], [place] [address]    Report Status 09/11/2024 FINAL  Final    Radiology Reports  No results found.    Signature  -   Brayton Lye M.D on 09/15/2024 at 7:28 AM   -  To page go to [email or web address]

## 2024-09-15 NOTE — Progress Notes (Signed)
 "  General Surgery Follow Up Note  Subjective:    Overnight Issues:   Objective:  Vital signs for last 24 hours: Temp:  [98.8 F (37.1 C)-99.2 F (37.3 C)] 99.2 F (37.3 C) (02/04 1639) Pulse Rate:  [88] 88 (02/04 1639) Resp:  [16-18] 18 (02/04 1639) BP: (121-136)/(78-89) 121/78 (02/05 0908)  Hemodynamic parameters for last 24 hours:    Intake/Output from previous day: 02/04 0701 - 02/05 0700 In: 1166.7 [I.V.:10; IV Piggyback:1156.7] Out: 30 [Drains:30]  Intake/Output this shift: Total I/O In: 6 [I.V.:6] Out: -   Vent settings for last 24 hours:    Physical Exam:  Gen: comfortable, no distress Neuro: follows commands, alert, communicative HEENT: PERRL Neck: supple CV: RRR Pulm: unlabored breathing on RA Abd: soft, NT   GU: urine clear and yellow, +spontaneous void Extr: wwp, no edema  Results for orders placed or performed during the hospital encounter of 08/27/24 (from the past 24 hours)  Glucose, capillary     Status: Abnormal   Collection Time: 09/14/24 12:50 PM  Result Value Ref Range   Glucose-Capillary 113 (H) 70 - 99 mg/dL  Glucose, capillary     Status: Abnormal   Collection Time: 09/14/24  4:44 PM  Result Value Ref Range   Glucose-Capillary 108 (H) 70 - 99 mg/dL  Comprehensive metabolic panel     Status: Abnormal   Collection Time: 09/15/24  3:08 AM  Result Value Ref Range   Sodium 138 135 - 145 mmol/L   Potassium 4.7 3.5 - 5.1 mmol/L   Chloride 104 98 - 111 mmol/L   CO2 25 22 - 32 mmol/L   Glucose, Bld 109 (H) 70 - 99 mg/dL   BUN 9 8 - 23 mg/dL   Creatinine, Ser 9.43 0.44 - 1.00 mg/dL   Calcium  9.4 8.9 - 10.3 mg/dL   Total Protein 7.4 6.5 - 8.1 g/dL   Albumin  2.8 (L) 3.5 - 5.0 g/dL   AST 19 15 - 41 U/L   ALT 14 0 - 44 U/L   Alkaline Phosphatase 87 38 - 126 U/L   Total Bilirubin 0.2 0.0 - 1.2 mg/dL   GFR, Estimated >39 >39 mL/min   Anion gap 9 5 - 15  Magnesium      Status: None   Collection Time: 09/15/24  3:08 AM  Result Value Ref  Range   Magnesium  2.2 1.7 - 2.4 mg/dL  Phosphorus     Status: None   Collection Time: 09/15/24  3:08 AM  Result Value Ref Range   Phosphorus 3.8 2.5 - 4.6 mg/dL    Assessment & Plan:  Present on Admission:  GI bleed  Substance abuse (HCC)  Candidiasis of esophagus (HCC)  Essential hypertension  Liver abscess  Bipolar affective disorder, currently manic, mild (HCC)    LOS: 19 days   Additional comments:I reviewed the patient's new clinical lab test results.   and I reviewed the patients new imaging test results.    64 yo female with a known gastric antral ulcer, presenting with acute abdominal pain and a large upper abdominal abscess, consistent with a contained perforation. S/p percutaneous drain placement 1/18. -IR with upsize of drain 1/26, cont to monitor. TID flushes and dressing changes q shift - On unasyn  and micafungin . - CT confirms suspicion of gastric perforation contained within this abscess cavity now.  Her nutrition is poor. Has been started on TPN. Rechecked nutrition labs 2/4, PA10, alb 2.9. Plan distal gastrectomy BI vs BII and j-tube. Extensive discussion with  patient regarding operative plan and risks discussed in detail, to include bleeding, infection, abscess, anastomotic leak, anastomotic dehiscence, dumping syndrome, bile reflux gastritis, afferent loop syndrome, nutritional deficiencies, injury to nearby structures, and need for additional surgery. Will plan for admission to ICU post-op in anticipation of NGT, JP drain(s), J-tube. Informed consent obtained from the patient after answering all her questions.  -Strict NPO  Dreama GEANNIE Hanger, MD Trauma & General Surgery Please use [email or web address] to contact on call provider  09/15/2024  *Care during the described time interval was provided by me. I have reviewed this patient's available data, including medical history, events of note, physical examination and test results as part of my evaluation.  "

## 2024-09-15 NOTE — Progress Notes (Signed)
 PHARMACY - TOTAL PARENTERAL NUTRITION CONSULT NOTE   Indication: Gastric fistula  Patient Measurements: Height: 5' 4 (162.6 cm) Weight: 81.2 kg (179 lb 0.2 oz) IBW/kg (Calculated) : 54.7 TPN AdjBW (KG): 61.4 Body mass index is 30.73 kg/m. Usual Weight: 180-182 lbs  Assessment:  64 yo woman with admitted on 1/2 and found to have gastric ulcer, gastritis and esophageal candidiasis on EGD. Patient left AMA 1/3 due to child care and returned same day with continued abdominal pain and dark stools. Patient developed AKI and was eventually discharged home 1/12. Patient called EMS on 1/17 for black tarry stools, vomiting and weakness. CT found large perihepatic abscess secondary to fistulous connection from ruptured gastric ulcer, which was been persistent despite drain placement and drain upsizing. 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 Patient tolerated dysphagia 3 diet well and likes Boost Breeze supplements. Patient was NPO again 1/28 for CT.  On 1/28 Patient reports she ate broccoli last night and this morning. A few bits of broccoli florets are present in abscess drain bag. Pharmacy consulted for TPN.  1/30 Pt refused PICC line 1/29 so she did not get TPN but agreeable today to receive. Pending surgery next week. Surgery approved clear liquids for now.  2/3 Likely to go to OR this week for gastric perf w/ abscess   Glucose / Insulin : A1C 5.7. BG < 150, off sliding scale  Electrolytes: K 4.7, CoCa 10.4, phos 3.8, Mag 2.2, others wnl Renal: Scr 0.56, BUN 9 Hepatic: LFTs WNL, albumin  2.8, prealbumin 10, TG 80 2/2 Intake / Output; MIVF: UOP x1 charted, 30 mL drain output, LR 75 mL/hr x24h  GI Imaging: 1/17 CT: large R hepatic subcapsular abscess with fistulous communication to gastric antrum  1/22 UGI: esophageal stasis/achalasia, delayed gastric emptying, 1/25 CT: subcapsular abscess in  anterior liver, drain in collection 1/27 KUB: Paucity of bowel gas throughout the abdomen  1/28 CT: persistent subcapsular fluid abscess, unchanged  2/2 Ab Xray: Perc chole tube may have been withdrawn 6-7 cm. No evidence of high-grade obstruction  GI Surgeries / Procedures:  1/18 R perihepatic abscess drain placement  1/26 drain upsize 2/5 pending surgery  Central access: PICC 1/30 TPN start date: 1/30  Nutritional Goals: Goal TPN rate is 75 mL/hr (provides 104 g of protein and 1753 kcals per day)  RD Assessment: Estimated Needs Total Energy Estimated Needs: 1700-1900 Total Protein Estimated Needs: 90-105 grams Total Fluid Estimated Needs: 1.7-1.9 L  Current Nutrition:  NPO + TPN   Plan:  Continue TPN at goal 75 mL/hr at 1800, provides 100% estimated needs   Electrolytes in TPN: Na 100 mEq/L, K 35 mEq/L, Ca 0 mEq/L, Mg 8 mEq/L, Phos 15 mmol/L. Cl:Ac 1:1  Continue standard MVI and trace elements to TPN Monitor TPN labs daily on Mon/Thurs and prn - labs in am --plan for partial gastrectomy BI vs BII and j-tube today  Thank you for involving pharmacy in this patient's care.  Delon Sax, PharmD, BCPS Clinical Pharmacist Clinical phone for 09/15/2024 is [phone] 09/15/2024 6:55 AM

## 2024-09-15 NOTE — Progress Notes (Signed)
 Psychiatric Nurse Liaison (PNL) Rounding Note  Attempted contact for proactive rounding. Pt resting in room with lights off. No apparent distress. Sitter present. Offered support to staff. Will continue to monitor.   Flora Ratz [employer], RN-BC

## 2024-09-15 NOTE — Op Note (Signed)
 "  Operative Note   Date: 09/15/2024  Procedure: Exploratory laparotomy, distal gastrectomy, cholecystectomy, gastro jejunostomy creation, Stamm gastrostomy tube placement, abdominal washout, JP drain placement, incisional wound VAC application 20 x 4 x 3 cm  Pre-op diagnosis: Perforated distal gastric ulcer Post-op diagnosis: Same  Indication and clinical history: The patient is a 64 y.o. year old female with perforated distal gastric ulcer.     Surgeon: Dreama GEANNIE Hanger, MD Assistant: Dasie, MD    Anesthesiologist: Niels, MD Anesthesia: General  Findings:  Specimen: Distal gastrectomy, gallbladder EBL: 25 cc Drains/Implants: 39 French JP drain x 2, right abdomen, medial drain traversing the duodenal stump and gastrojejunostomy; lateral drain above the liver  Disposition: PACU - hemodynamically stable.  Description of procedure: The patient was positioned supine on the operating room table. General anesthetic induction and intubation were uneventful. Foley catheter insertion was performed and was atraumatic. Time-out was performed verifying correct patient, procedure, signature of informed consent, and administration of pre-operative antibiotics. The abdomen was prepped and draped in the usual sterile fashion.  An upper midline incision was made and deepened through the fascia until the peritoneal cavity was entered.  The hepatic abscess and gastric fistula were identified and the fluid collection and abscess unroofed.  Copious amounts of particulate food matter including vegetable debris and intact broccoli florets were extracted in addition to copious amounts of purulent fluid.  The fistulous tract from the stomach was identified and the stomach mobilized.  The lesser sac was entered and the proximal stomach mobilized.  The proximal duodenum was soft and healthy in appearance.  Once the entirety of the stomach was freed from the adhesions to the liver, the distal portion of the stomach  was transected immediately distal to the pylorus with a TA 60 blue load stapler.  Next the proximal stomach was transected using an GIA 75 green load.  Suture ligation was used to control the gastroepiploic vessels.  Once the specimen was completely freed it was sent to pathology as a permanent specimen.  Next, attention was taken to the gallbladder and given the extent of infectious process and adhesive disease in this region, the decision was made to perform a cholecystectomy.  This was performed in a top-down fashion.  The cystic artery was controlled with electrocautery and the cystic duct was controlled with suture ligation.  The gallbladder was sent to pathology as a permanent specimen.  The abdomen was then inspected to confirm hemostasis.  A loop of jejunum approximately 40 cm distal to the ligament of Treitz was brought up to the stomach remnant.  A side-to-side stapled anastomosis was created and confirmed to be widely patent at the conclusion of its creation.  The opening of the common channel was closed in two layers with a 2-0 Vicryl deep and a 3-0 PDS imbricating suture.  A tongue of omentum was tubularized and secured overlying the duodenal remnant.  A gastrostomy tube was placed in the proximal stomach.  Two pursestring sutures were secured around the gastrostomy tube and the stomach was secured at three points to the abdominal wall.  The gastrostomy tube was secured externally as well with 2-0 nylon suture and the tube connected to a gravity bag drainage.  The abdomen was copiously irrigated.  A 19 French JP drain was placed in the right abdomen traversing the duodenal stump and the gastrojejunostomy.  A second 33 French JP drain was placed in the right abdomen laterally and terminated above the liver.  The fascia was closed  with #1 looped PDS suture.  A wound VAC was applied as sterile dressing.  All sponge and instrument counts were correct at the conclusion of the procedure. The patient was  awakened from anesthesia, extubated uneventfully, and transported to the PACU in good condition. There were no complications.   Upon entering the abdomen (organ space), I encountered an abscess in the peritoneal cavity above the liver, feculent peritonitis.  CASE DATA:  Type of patient?: DOW CASE (Surgical Hospitalist [facility] Inpatient)  Status of Case? URGENT Add On  Infection Present At Time Of Surgery (PATOS)?  ABSCESS in the peritoneal cavity above the liver, feculent peritonitis    Dreama GEANNIE Hanger, MD General and Trauma Surgery [facility] Surgery  "

## 2024-09-15 NOTE — Anesthesia Preprocedure Evaluation (Addendum)
 "                                  Anesthesia Evaluation  Patient identified by MRN, date of birth, ID band Patient awake    Reviewed: Allergy & Precautions, NPO status , Patient's Chart, lab work & pertinent test results, reviewed documented beta blocker date and time   Airway Mallampati: II  TM Distance: >3 FB Neck ROM: Full    Dental  (+) Edentulous Upper, Dental Advisory Given   Pulmonary Current Smoker and Patient abstained from smoking.   Pulmonary exam normal breath sounds clear to auscultation       Cardiovascular hypertension, Pt. on home beta blockers and Pt. on medications Normal cardiovascular exam Rhythm:Regular Rate:Normal  TTE 2026  1. Left ventricular ejection fraction, by estimation, is >75%. The left  ventricle has hyperdynamic function. The left ventricle has no regional  wall motion abnormalities. There is severe left ventricular hypertrophy of  the apical segment. Left  ventricular diastolic parameters are indeterminate.   2. Right ventricular systolic function is normal. The right ventricular  size is normal. Tricuspid regurgitation signal is inadequate for assessing  PA pressure.   3. The mitral valve is normal in structure. Trivial mitral valve  regurgitation. No evidence of mitral stenosis.   4. The aortic valve has an indeterminant number of cusps. Aortic valve  regurgitation is not visualized. No aortic stenosis is present.   5. The inferior vena cava is normal in size with greater than 50%  respiratory variability, suggesting right atrial pressure of 3 mmHg.     Neuro/Psych  PSYCHIATRIC DISORDERS Anxiety Depression Bipolar Disorder   negative neurological ROS     GI/Hepatic PUD,GERD  ,,(+)     substance abuse  alcohol use and cocaine use  Endo/Other  negative endocrine ROS    Renal/GU negative Renal ROS  negative genitourinary   Musculoskeletal negative musculoskeletal ROS (+)    Abdominal   Peds  Hematology  (+)  Blood dyscrasia, anemia Lab Results      Component                Value               Date                      WBC                      5.7                 09/14/2024                HGB                      8.5 (L)             09/14/2024                HCT                      27.5 (L)            09/14/2024                MCV                      87.3  09/14/2024                PLT                      542 (H)             09/14/2024              Anesthesia Other Findings 64 yo female with a known gastric antral ulcer, presenting with acute abdominal pain and a large upper abdominal abscess, consistent with a contained perforation. S/p percutaneous drain placement 1/18.  On TPN  Reproductive/Obstetrics                              Anesthesia Physical Anesthesia Plan  ASA: 3  Anesthesia Plan: General   Post-op Pain Management: Tylenol  PO (pre-op)*, Ketamine  IV* and Dilaudid  IV   Induction: Intravenous and Rapid sequence  PONV Risk Score and Plan: 2 and Midazolam , Dexamethasone  and Ondansetron   Airway Management Planned: Oral ETT  Additional Equipment:   Intra-op Plan:   Post-operative Plan: Extubation in OR  Informed Consent: I have reviewed the patients History and Physical, chart, labs and discussed the procedure including the risks, benefits and alternatives for the proposed anesthesia with the patient or authorized representative who has indicated his/her understanding and acceptance.     Dental advisory given  Plan Discussed with: CRNA  Anesthesia Plan Comments:          Anesthesia Quick Evaluation  "

## 2024-09-15 NOTE — Progress Notes (Signed)
 [facility] Psychiatrist) [phone]

## 2024-09-15 NOTE — Anesthesia Procedure Notes (Signed)
 Procedure Name: Intubation Date/Time: 09/15/2024 1:02 PM  Performed by: Erick Fitz, CRNAPre-anesthesia Checklist: Patient identified, Emergency Drugs available, Suction available, Patient being monitored and Timeout performed Patient Re-evaluated:Patient Re-evaluated prior to induction Oxygen Delivery Method: Circle system utilized Preoxygenation: Pre-oxygenation with 100% oxygen Induction Type: IV induction Ventilation: Mask ventilation without difficulty Laryngoscope Size: Mac and 4 Grade View: Grade I Tube type: Oral Tube size: 7.0 mm Number of attempts: 1 Airway Equipment and Method: Stylet Placement Confirmation: ETT inserted through vocal cords under direct vision, positive ETCO2, CO2 detector and breath sounds checked- equal and bilateral Secured at: 22 cm Tube secured with: Tape (secured with pink Hy-tape) Dental Injury: Teeth and Oropharynx as per pre-operative assessment  Comments: Extremely poor dentition; some lower teeth, edentulous upper

## 2024-09-15 NOTE — Progress Notes (Signed)
 Called floor for report for surgery, nurse unavailable at this time.

## 2024-09-15 NOTE — Progress Notes (Signed)
" ° °  Brief Progress Note   _____________________________________________________________________________________________________________  Patient Name: Jennifer [name] Patient DOB: 04/14/1961 Date: 09-15-24     Action: Reached out to bedside nurse to help make sure patient is ready for surgery.  _____________________________________________________________________________________________________________  [facility] Patient Care Command RN Expeditor Rexene LITTIE Kirks Please contact us  directly via secure chat (search for [facility]) or by calling us  at [phone] [facility]).  "

## 2024-09-15 NOTE — Brief Op Note (Signed)
 OR Date: 09/15/2024 OR Patient Start Time: 12:45 PM  4:24 PM  PATIENT:  Jennifer [name]  64 y.o. female  PRE-OPERATIVE DIAGNOSIS:  perforated distal gastric ulcer  POST-OPERATIVE DIAGNOSIS:  perforated distal gastric ulcer  PROCEDURE:  Procedures: DISTAL GASTRECTOMY, PARTIAL, GASTRODUODENSTOMY (N/A) GASTROJEJUNOSTOMY INSERTION, GASTROSTOMY TUBE, PERCUTANEOUS  SURGEON:  Surgeons and Role:    * Ezra Denne, Dreama SAILOR, MD - Primary    * Dasie Leonor CROME, MD - Assisting  PHYSICIAN ASSISTANT:   ASSISTANTSBETHA Dasie, MD   ANESTHESIA:   general  EBL:  25 mL   BLOOD ADMINISTERED:none  DRAINS: (2) Jackson-Pratt drain(s) with closed bulb suction in the right abdomen, Urinary Catheter (Foley), and Gastrostomy Tube   LOCAL MEDICATIONS USED:  NONE  SPECIMEN:  Source of Specimen:  distal gastrectomy, gallbladder   DISPOSITION OF SPECIMEN:  PATHOLOGY  COUNTS:  YES  TOURNIQUET:  * No tourniquets in log *  DICTATION: .Note written in EPIC  PLAN OF CARE: Admit to inpatient   PATIENT DISPOSITION:  PACU - hemodynamically stable.   Delay start of Pharmacological VTE agent (>24hrs) due to surgical blood loss or risk of bleeding: no

## 2024-09-15 NOTE — Transfer of Care (Signed)
 Immediate Anesthesia Transfer of Care Note  Patient: Jennifer [name] Na  Procedure(s) Performed: DISTAL GASTRECTOMY, PARTIAL, GASTRODUODENSTOMY (Abdomen) GASTROJEJUNOSTOMY (Abdomen) INSERTION, GASTROSTOMY TUBE, PERCUTANEOUS (Abdomen)  Patient Location: PACU  Anesthesia Type:General  Level of Consciousness: awake and oriented  Airway & Oxygen Therapy: Patient Spontanous Breathing and Patient connected to face mask oxygen  Post-op Assessment: Report given to RN and Post -op Vital signs reviewed and stable  Post vital signs: Reviewed and stable  Last Vitals:  Vitals Value Taken Time  BP 129/71 09/15/24 16:15  Temp 36.4 C 09/15/24 16:13  Pulse 98 09/15/24 16:24  Resp 20 09/15/24 16:24  SpO2 92 % 09/15/24 16:24  Vitals shown include unfiled device data.  Last Pain:  Vitals:   09/15/24 1230  TempSrc:   PainSc: 8       Patients Stated Pain Goal: 0 (09/13/24 2001)  Complications: No notable events documented.

## 2024-09-16 ENCOUNTER — Encounter (HOSPITAL_COMMUNITY): Payer: Self-pay | Admitting: Surgery

## 2024-09-16 LAB — COMPREHENSIVE METABOLIC PANEL WITH GFR
ALT: 42 U/L (ref 0–44)
AST: 70 U/L — ABNORMAL HIGH (ref 15–41)
Albumin: 2.8 g/dL — ABNORMAL LOW (ref 3.5–5.0)
Alkaline Phosphatase: 88 U/L (ref 38–126)
Anion gap: 7 (ref 5–15)
BUN: 13 mg/dL (ref 8–23)
CO2: 25 mmol/L (ref 22–32)
Calcium: 9.5 mg/dL (ref 8.9–10.3)
Chloride: 105 mmol/L (ref 98–111)
Creatinine, Ser: 0.59 mg/dL (ref 0.44–1.00)
GFR, Estimated: >60 mL/min
Glucose, Bld: 159 mg/dL — ABNORMAL HIGH (ref 70–99)
Potassium: 5.4 mmol/L — ABNORMAL HIGH (ref 3.5–5.1)
Sodium: 137 mmol/L (ref 135–145)
Total Bilirubin: 0.2 mg/dL (ref 0.0–1.2)
Total Protein: 7.2 g/dL (ref 6.5–8.1)

## 2024-09-16 LAB — CBC
HCT: 28.7 % — ABNORMAL LOW (ref 36.0–46.0)
Hemoglobin: 9 g/dL — ABNORMAL LOW (ref 12.0–15.0)
MCH: 27.5 pg (ref 26.0–34.0)
MCHC: 31.4 g/dL (ref 30.0–36.0)
MCV: 87.8 fL (ref 80.0–100.0)
Platelets: 537 10*3/uL — ABNORMAL HIGH (ref 150–400)
RBC: 3.27 MIL/uL — ABNORMAL LOW (ref 3.87–5.11)
RDW: 15.9 % — ABNORMAL HIGH (ref 11.5–15.5)
WBC: 19.6 10*3/uL — ABNORMAL HIGH (ref 4.0–10.5)
nRBC: 0 % (ref 0.0–0.2)

## 2024-09-16 LAB — PHOSPHORUS: Phosphorus: 3 mg/dL (ref 2.5–4.6)

## 2024-09-16 LAB — MAGNESIUM: Magnesium: 2.2 mg/dL (ref 1.7–2.4)

## 2024-09-16 MED ORDER — HYDROMORPHONE 1 MG/ML IV SOLN
INTRAVENOUS | Status: AC
  Administered 2024-09-16: 3.15 mg via INTRAVENOUS
  Administered 2024-09-16: 6.08 mg via INTRAVENOUS
  Administered 2024-09-16: 7.67 mg via INTRAVENOUS
  Administered 2024-09-16: 30 mg via INTRAVENOUS
  Administered 2024-09-16: 7.8 mg via INTRAVENOUS
  Filled 2024-09-16 (×2): qty 30

## 2024-09-16 MED ORDER — TRAVASOL 10 % IV SOLN: INTRAVENOUS | Status: DC

## 2024-09-16 MED ORDER — STERILE WATER FOR INJECTION IJ SOLN
INTRAMUSCULAR | Status: AC
  Administered 2024-09-16: 10 mL
  Filled 2024-09-16: qty 10

## 2024-09-16 MED ORDER — ZIPRASIDONE MESYLATE 20 MG IM SOLR
10.0000 mg | Freq: Once | INTRAMUSCULAR | Status: AC
  Administered 2024-09-16: 10 mg via INTRAMUSCULAR
  Filled 2024-09-16: qty 20

## 2024-09-16 MED ORDER — SODIUM ZIRCONIUM CYCLOSILICATE 5 G PO PACK
5.0000 g | PACK | Freq: Once | ORAL | Status: AC
  Filled 2024-09-16: qty 1

## 2024-09-16 MED ORDER — TRAVASOL 10 % IV SOLN
INTRAVENOUS | Status: AC
  Filled 2024-09-16: qty 1113.6

## 2024-09-16 NOTE — Consult Note (Signed)
" °  CLINICAL SUPPORT TEAM - WOUND OSTOMY AND CONTINENCE TEAM  CONSULTATION SERVICES   WOC Nurse-Inpatient Note  WOC consult for [facility] dressing changes, contact primary nurse to have supplies ready at the bedside for 2/09 first dressing change, large abdominal wound with current VAC dressing in place, provider orders indicate M/Thurs change.   Exploratory laparotomy, distal gastrectomy, cholecystectomy, gastro jejunostomy creation, Stamm gastrostomy tube placement, abdominal washout, JP drain placement, incisional wound VAC application 20 x 4 x 3 cm    WOC placed patient on follow-up list.   If an alarm condition cannot be resolved after attempting resolutions listed in the troubleshooting guide, then contact [facility] at [phone]. Promptly notify the treating physician to obtain orders for their desired alternative dressing if the negative-therapy unit is going to be turned off or has been malfunctioning for 2 hours or more.   Sherrilyn Hals MSN RN CWOCN WOC [facility]  [phone] (Available from 7-3 pm Mon-Friday)  "

## 2024-09-16 NOTE — Progress Notes (Addendum)
 "  NAME:  Jennifer [name], MRN:  [id number], DOB:  1961/01/18, LOS: 20 ADMISSION DATE:  08/27/2024, CONSULTATION DATE:  2/5 REFERRING MD: General Surgery, CHIEF COMPLAINT: Medical consult  History of Present Illness:  Jennifer [name] is a 64 year old female with a past medical history significant for GI bleed with recent admission 1/3 patient left AMA prior to completion of treatment, HTN, alcohol use, polysubstance abuse, type I bipolar disease, thrombocytopenia, and depression who presented to the ED at [facility] 1/17 for complaints of melena and weakness.  CT abdomen pelvis revealed a large right hepatic subcapsular abscess with with moderate-sized right effusion.  Patient was admitted per hospitalist.  See below for pertinent hospital events.  Pertinent  Medical History  GI bleed with recent admission 1/3 patient left AMA prior to completion of treatment, HTN, alcohol use, polysubstance abuse, type I bipolar disease, thrombocytopenia, and depression   Significant Hospital Events: Including procedures, antibiotic start and stop dates in addition to other pertinent events   1/17 presented with melena and weakness CT with large right hepatic subcapsular abscess admitted per [facility] 1/18 General Surgery consulted given CT images concern for perforated gastric ulcer plan to treat conservatively.  IR placed CT-guided perihepatic abscess and drain placement and preformed right thora  1/19 ID consulted, Tmax 101.1 1/20 increasing agitation  1/22 UGI today was suspicious for achalasia, with minimal contrast passage into stomach, so contrast extravasation could not be assessed. Clear liquid diet started  1/23 diet advanced to full liquid  1/25 Soft diet  1/26 Repeat CT with persistent large perihepatic collection, but this does not appear to communicate with the stomach. There is no perigastric free fluid or air to suggest on an ongoing gastric leak, and the patient remains clinically stable. Will consult  IR for drain upsize vs placement of an additional drain into the perihepatic abscess >IR upsized drain  1/29 CT confirms suspicion of gastric perforation contained within this abscess cavity now. Her nutrition is poor. She will need an operation likely next week for distal gastrectomy and evacuation of this abscess  1/30 PICC placed and TPN started. Patient IVC'd 1/31 Psych formally consulted and determined patient does not have capacity to made medical decisions  2/5 underwent exploratory laparotomy, distal gastrectomy, cholecystectomy, gastro jejunostomy creation, Stamm gastrostomy tube placement, abdominal washout, JP drain placement, incisional wound VAC application.  Brought to ICU postop  Interim History / Subjective:   Remains in ICU, on Precedex  Complains of 10 out of 10 pain in the abdomen  Objective    Blood pressure 106/63, pulse 70, temperature 98 F (36.7 C), temperature source Oral, resp. rate (!) 24, height 5' 4 (1.626 m), weight 80.2 kg, last menstrual period 12/04/2009, SpO2 96%.        Intake/Output Summary (Last 24 hours) at 09/16/2024 0741 Last data filed at 09/16/2024 0700 Gross per 24 hour  Intake 9638.99 ml  Output 2175 ml  Net 7463.99 ml   Filed Weights   09/14/24 0500 09/15/24 1151 09/16/24 0500  Weight: 81.2 kg 81.2 kg 80.2 kg    Examination: Chronically ill-appearing.  Appears older than stated age Awake, oriented Abdominal wound VAC, JP drain Lungs clear to auscultation Heart regular regular rate and rhythm  Lab/imaging reviewed. Significant for potassium 5.4, AST 70 WBC 19.6, hemoglobin 9.0, platelets 537  Patient Lines/Drains/Airways Status     Active Line/Drains/Airways     Name Placement date Placement time Site Days   Peripheral IV 09/15/24 22 G 2.5 Anterior;Left Forearm  09/15/24  2143  Forearm  1   PICC Double Lumen 09/09/24 Right Brachial 37 cm 0 cm 09/09/24  1012  -- 7   Closed System Drain 1 RLQ Bulb (JP) 19 Fr. 09/15/24  1509  RLQ  1    Closed System Drain 2 RLQ Bulb (JP) 19 Fr. 09/15/24  1510  RLQ  1   Negative Pressure Wound Therapy Abdomen Mid 09/15/24  1544  --  1   Gastrostomy/Enterostomy Gastrostomy 20 Fr. LUQ 09/15/24  1506  LUQ  1   Urethral Catheter Warren Chin RN Latex;Straight-tip 16 Fr. 09/15/24  1300  Latex;Straight-tip  1   Wound 09/15/24 1405 Surgical Closed Surgical Incision Abdomen Right 09/15/24  1405  Abdomen  1   Wound 09/15/24 1512 Surgical Closed Surgical Incision Abdomen Other (Comment) 09/15/24  1512  Abdomen  1             Resolved problem list   Assessment and Plan  Right hepatic abscess secondary to contained gastric perforation with fistulous communication  - Several attempts have been made medical management including CT scan to multiple pleural percutaneous soft tissue symptoms most recent upsize with her on 1/28.  Ultimately underwent ex lap with partial gastrectomy and cholecystectomy P: Pain management with PCA.  Adjust basal rate to 1.5.  Continue methadone  IV On Precedex  to help with agitation Has been on Unasyn  and micafungin  since 1/19.  WBC count higher which may be reactive.  No cultures sent from operation yesterday.  Will reengage with ID to determine duration of antibiotic therapy. TPN for nutrition  Noncompliance Patient currently does not have capacity to made medical decisions -Per chart review multiple providers have counseled patient regarding the importance of current management despite this she has left AMA several times.  -Psych was consulted 1/31 and on their evaluation patient did not have capacity to make medical decision.  P: Currently IVC'd through 2/7  Right pleural effusion  - S/p thoracentesis 1/18, exudative by lights criteria.  Cultures remain negative felt secondary to sympathetic effusion due to hepatic abscess P: Follow intermittent chest x-ray  Essential hypertension P: Telemetry monitoring. Continue amlodipine  when able to take p.o., as needed  hydralazine   Alcohol use Polysubstance abuse - 18 days into admission thus far, no clinical signs of withdrawal P: Continue thiamine , folate, multivitamin Wean off Precedex   Normocytic anemia- P: Follow CBC.  Transfuse for hemoglobin less than 7  Hypoalbuminemia P: Continue TPN  Hyperkalemia P: Give Lokelma  x 1 Adjust TPN formula.  Discussed with pharmacy Follow labs.  Critical care time:    The patient is critically ill with multiple organ system failure and requires high complexity decision making for assessment and support, frequent evaluation and titration of therapies, advanced monitoring, review of radiographic studies and interpretation of complex data.   Critical Care Time devoted to patient care services, exclusive of separately billable procedures, described in this note is 35 minutes.   Ervey Fallin MD [facility] See Amion for pager  If no response to pager , please call [phone] until 7pm After 7:00 pm call Elink  [phone] 09/16/2024, 8:08 AM         "

## 2024-09-16 NOTE — Evaluation (Signed)
 Occupational Therapy Evaluation Patient Details Name: Jennifer [name] MRN: [id number] DOB: 07-16-1961 Today's Date: 09/16/2024   History of Present Illness   64 y.o. female presents to [facility] hospital on 08/27/2024 with black tarry stools, vomiting and weakness. Pt recently admitted for GIB with suspicion for candidiasis and gastritis but left AMA prior to completion of treatment. Imaging notable for perforated gastric ulcer and R pleural effusion on 1/18, pt underwent IR drain placement along with thoracentesis on 1/18. Drain upsize on 1/26. Pt underwent ex-lap with distal gastrectomy, cholecystectomy, gastro jejunostomy creation, G-tube placement on 2/5. PMH includes HTN, bipolar affective disorder.     Clinical Impressions Patient is s/p ex lap surgery with complex admission resulting in functional limitations due to the deficits listed below (see OT problem list). Pt currently needs two person (A) to manage all lines leads and impulsive needs of the patient. Pt demonstrates cognitive deficits and could benefit from further assessments to address. Pt seeing roaches no actually present in the room. Pt calmed at the end of session with music playing. Pt seeing therapist use computer and yet verbalized can you leave the CD?  ( The music is being streamed through the computer)  Patient will benefit from skilled OT acutely to increase independence and safety with ADLS to allow discharge skilled inpatient follow up therapy, <3 hours/day. .      If plan is discharge home, recommend the following:   Two people to help with walking and/or transfers;Two people to help with bathing/dressing/bathroom     Functional Status Assessment   Patient has had a recent decline in their functional status and demonstrates the ability to make significant improvements in function in a reasonable and predictable amount of time.     Equipment Recommendations   BSC/3in1;Other (comment) (TBA)      Recommendations for Other Services   Speech consult;Other (comment) (need more cognitive assessments to determine pts ability to have capacity to accurate determine POC with therapy)     Precautions/Restrictions   Precautions Precautions: Fall Recall of Precautions/Restrictions: Impaired Precaution/Restrictions Comments: peg, JP drainx2 , wound vac TPN, flexiseal, Restrictions Weight Bearing Restrictions Per Provider Order: No     Mobility Bed Mobility Overal bed mobility: Needs Assistance Bed Mobility: Supine to Sit     Supine to sit: Min assist, HOB elevated          Transfers Overall transfer level: Needs assistance Equipment used: 2 person hand held assist Transfers: Sit to/from Stand, Bed to chair/wheelchair/BSC Sit to Stand: Min assist     Step pivot transfers: Min assist, +2 physical assistance            Balance Overall balance assessment: Needs assistance Sitting-balance support: No upper extremity supported, Feet supported Sitting balance-Leahy Scale: Fair     Standing balance support: Bilateral upper extremity supported, Reliant on assistive device for balance Standing balance-Leahy Scale: Poor                             ADL either performed or assessed with clinical judgement   ADL Overall ADL's : Needs assistance/impaired Eating/Feeding: Independent;Sitting   Grooming: Set up;Sitting   Upper Body Bathing: Moderate assistance;Sitting   Lower Body Bathing: Total assistance       Lower Body Dressing: Total assistance Lower Body Dressing Details (indicate cue type and reason): don socks Toilet Transfer: +2 for physical assistance;Minimal assistance  General ADL Comments: pt with two IV poles, 2 JP drains, foley, drain wound vac and constant verbal cues to help with implusive needs during transfer     Vision Baseline Vision/History: 1 Wears glasses Additional Comments: pt seeing objects not present so  visual assessment was difficult. pt does visually attend to therapist talking appropriately with full contact     Perception         Praxis         Pertinent Vitals/Pain Pain Assessment Pain Assessment: Faces Faces Pain Scale: Hurts even more Pain Location: back and abdomen Pain Descriptors / Indicators: Grimacing Pain Intervention(s): PCA encouraged     Extremity/Trunk Assessment Upper Extremity Assessment Upper Extremity Assessment: Generalized weakness   Lower Extremity Assessment Lower Extremity Assessment: Generalized weakness   Cervical / Trunk Assessment Cervical / Trunk Assessment: Other exceptions Cervical / Trunk Exceptions: gastrectomy, abdominal binder, wound vac and multiple drains   Communication Communication Communication: Impaired (tangential thought) Factors Affecting Communication: Reduced clarity of speech   Cognition Arousal: Alert Behavior During Therapy: Impulsive, Agitated Cognition: Cognition impaired, No family/caregiver present to determine baseline   Orientation impairments: Place, Person, Time, Situation Awareness: Intellectual awareness impaired, Online awareness impaired Memory impairment (select all impairments): Short-term memory Attention impairment (select first level of impairment): Focused attention Executive functioning impairment (select all impairments): Organization, Sequencing, Reasoning, Problem solving OT - Cognition Comments: Pt seeing objects in the room not there like roaches. pt wants to go outside and doesnt understand why the IV pole must be apart of our transfer. Pt states just leave them Pt fixated on walking outside despite two person (A) transfer to the chair with deficits notes. Pt easily upset at times by staff making statements to RN like you are just trying to get me out of here when RN is doing her job with properly assess IV / charting quieting to the side. Pt enjoys [employer], designer, industrial/product specifically  cheron in my mind, and talking about catholic religious beliefs                 Following commands: Impaired Following commands impaired: Follows one step commands inconsistently, Follows multi-step commands inconsistently     Cueing  General Comments   Cueing Techniques: Verbal cues;Tactile cues;Visual cues  8L [place] on arrival and VSS so decreased to 5L Cut [place]   Exercises     Shoulder Instructions      Home Living Family/patient expects to be discharged to:: Shelter/Homeless                                 Additional Comments: recent PT evaluation in January indicates the pt is living in a shed      Prior Functioning/Environment Prior Level of Function : Independent/Modified Independent                    OT Problem List: Decreased strength;Decreased activity tolerance;Impaired balance (sitting and/or standing);Decreased cognition;Decreased safety awareness;Decreased knowledge of use of DME or AE;Cardiopulmonary status limiting activity;Decreased knowledge of precautions   OT Treatment/Interventions: Self-care/ADL training;Therapeutic exercise;Energy conservation;DME and/or AE instruction;Manual therapy;Modalities;Therapeutic activities;Cognitive remediation/compensation;Patient/family education;Balance training      OT Goals(Current goals can be found in the care plan section)   Acute Rehab OT Goals Patient Stated Goal: to go outside OT Goal Formulation: Patient unable to participate in goal setting Time For Goal Achievement: 09/30/24 Potential to Achieve Goals: Good   OT Frequency:  Min 2X/week    Co-evaluation PT/OT/SLP Co-Evaluation/Treatment: Yes Reason for Co-Treatment: Complexity of the patient's impairments (multi-system involvement);Necessary to address cognition/behavior during functional activity;To address functional/ADL transfers;For patient/therapist safety PT goals addressed during session: Mobility/safety with  mobility;Balance;Proper use of DME;Strengthening/ROM OT goals addressed during session: Proper use of Adaptive equipment and DME;ADL's and self-care;Strengthening/ROM      AM-PAC OT 6 Clicks Daily Activity     Outcome Measure Help from another person eating meals?: A Little Help from another person taking care of personal grooming?: A Little Help from another person toileting, which includes using toliet, bedpan, or urinal?: Total Help from another person bathing (including washing, rinsing, drying)?: Total Help from another person to put on and taking off regular upper body clothing?: Total Help from another person to put on and taking off regular lower body clothing?: Total 6 Click Score: 10   End of Session Nurse Communication: Mobility status;Precautions  Activity Tolerance: Patient tolerated treatment well Patient left: in chair;with call bell/phone within reach;with chair alarm set;with nursing/sitter in room;Other (comment) psychiatrist)  OT Visit Diagnosis: Unsteadiness on feet (R26.81);Muscle weakness (generalized) (M62.81)                Time: 8776-8753 OT Time Calculation (min): 23 min Charges:  OT General Charges $OT Visit: 1 Visit OT Evaluation $OT Eval Moderate Complexity: 1 Mod   Brynn, OTR/L  Acute Rehabilitation Services Office: [phone] .   Ely Molt 09/16/2024, 2:16 PM

## 2024-09-16 NOTE — Progress Notes (Signed)
 Psychiatric Nurse Liaison (PNL) Rounding Note   PNL went to see patient and found she was being changed. Patient told the nurses who were there that she does not like to be seen by men. PNL left pt's room as expressed by the pt.

## 2024-09-16 NOTE — Anesthesia Postprocedure Evaluation (Signed)
"   Anesthesia Post Note  Patient: Jennifer [name]  Procedure(s) Performed: DISTAL GASTRECTOMY, PARTIAL, GASTRODUODENSTOMY (Abdomen) GASTROJEJUNOSTOMY (Abdomen) INSERTION, GASTROSTOMY TUBE, PERCUTANEOUS (Abdomen)     Patient location during evaluation: PACU Anesthesia Type: General Level of consciousness: awake and alert Pain management: pain level controlled Vital Signs Assessment: post-procedure vital signs reviewed and stable Respiratory status: spontaneous breathing, nonlabored ventilation, respiratory function stable and patient connected to nasal cannula oxygen Cardiovascular status: blood pressure returned to baseline and stable Postop Assessment: no apparent nausea or vomiting Anesthetic complications: no   No notable events documented.  Last Vitals:  Vitals:   09/16/24 1705 09/16/24 1800  BP: (!) 98/57 108/68  Pulse: 74 75  Resp: (!) 25 (!) 29  Temp:    SpO2: 100% 93%    Last Pain:  Vitals:   09/16/24 1551  TempSrc:   PainSc: 10-Worst pain ever   Pain Goal: Patients Stated Pain Goal: 0 (09/13/24 2001)                 Pardeep Pautz L Latoy Labriola      "

## 2024-09-16 NOTE — TOC Progression Note (Addendum)
 Transition of Care [facility]) - Progression Note    Patient Details  Name: Jennifer [name] MRN: [id number] Date of Birth: 1960/11/24  [facility] CM/SW Contact  Inocente GORMAN Kindle, LCSW Phone Number: 09/16/2024, 11:44 AM  Clinical Narrative:    Per MD, IVC will need to be renewed. CSW uploaded paperwork to [facility], Envelope # [id number]. Contacted magistrate for review.  Case accepted, [id number]. Paperwork placed on hard chart to be served.    Expected Discharge Plan: Home/Self Care Barriers to Discharge: Continued Medical Work up               Expected Discharge Plan and Services       Living arrangements for the past 2 months: Single Family Home                                       Social Drivers of Health (SDOH) Interventions SDOH Screenings   Food Insecurity: No Food Insecurity (08/29/2024)  Housing: Low Risk (08/29/2024)  Transportation Needs: No Transportation Needs (08/29/2024)  Recent Concern: Transportation Needs - Unmet Transportation Needs (08/14/2024)  Utilities: Not At Risk (08/29/2024)  Depression (PHQ2-9): Low Risk (05/24/2024)  Recent Concern: Depression (PHQ2-9) - Medium Risk (03/04/2024)  Tobacco Use: High Risk (09/15/2024)    Readmission Risk Interventions     No data to display

## 2024-09-16 NOTE — Progress Notes (Signed)
 PHARMACY - TOTAL PARENTERAL NUTRITION CONSULT NOTE  Indication: Gastric fistula  Patient Measurements: Height: 5' 4 (162.6 cm) Weight: 80.2 kg (176 lb 12.9 oz) IBW/kg (Calculated) : 54.7 TPN AdjBW (KG): 61.3 Body mass index is 30.35 kg/m. Usual Weight: 180-182 lbs  Assessment:  63 YOF admitted on 1/2 and found to have gastric ulcer, gastritis and esophageal candidiasis on EGD. Patient left AMA 1/3 due to child care and returned same day with continued abdominal pain and dark stools. Patient developed AKI and was eventually discharged home 1/12. Patient called EMS on 1/17 for black tarry stools, vomiting and weakness. CT found large perihepatic abscess secondary to fistulous connection from ruptured gastric ulcer, which was been persistent despite drain placement and drain upsizing. 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 Patient tolerated dysphagia 3 diet well and likes Boost Breeze supplements. Patient was NPO again 1/28 for CT.  On 1/28 Patient reports she ate broccoli last night and this morning. A few bits of broccoli florets are present in abscess drain bag. Pharmacy consulted for TPN.  Glucose / Insulin : A1c 5.7% - CBGs well controlled, off sliding scale  Electrolytes: K up to 5.4, CoCa slightly elevated at 10.5 (none in TPN), others WNL   Renal: SCr < 1, BUN WNL Hepatic: LFTs WNL except AST at 70, albumin  2.8, prealbumin 10, TG WNL Intake / Output; MIVF: UOP 0.8 ml/kg/hr, drain , off LR at 75 ml/hr  GI Imaging: 1/17 CT: large R hepatic subcapsular abscess with fistulous communication to gastric antrum  1/22 UGI: esophageal stasis/achalasia, delayed gastric emptying, 1/25 CT: subcapsular abscess in anterior liver, drain in collection 1/27 KUB: Paucity of bowel gas throughout the abdomen  1/28 CT: persistent subcapsular fluid abscess, unchanged  2/2 Ab Xray: Perc chole tube may  have been withdrawn 6-7 cm. No evidence of high-grade obstruction  GI Surgeries / Procedures:  1/18 R perihepatic abscess drain placement  1/26 drain upsize 2/5 ex-lap with distal gastrectomy, cholecystectomy, GJ creation, Gtube placement, abd washout, JP drain placement, VAC application  Central access: PICC placed 09/09/24 TPN start date: 09/09/24  Nutritional Goals: Goal TPN rate is 80 mL/hr (provides 111g AA and 1817 kcals per day)  RD Estimated Needs Total Energy Estimated Needs: 1800-2000 Total Protein Estimated Needs: 110-120 grams Total Fluid Estimated Needs: >1.8 L/day  Current Nutrition:  TPN  Resource Breeze daily - none charted given  Plan:  Discussed with CCM, give Lokelmg 5gm and finish current TPN bag.  Reduce K with next TPN bag. Adjust TPN to meet 100% of updated needs - TPN at 80 ml/hr starting at 1800   Electrolytes in TPN: Na 100 mEq/L, reduce K to 84mEq/L, Ca 0 mEq/L since 2/4, tweak Mg to 6 mEq/L and Phos to 55mmol/L, Cl:Ac 1:1  Continue standard MVI and trace elements to TPN Monitor TPN labs daily on Mon/Thurs - BMET in AM  Kimmie Doren D. Lendell, PharmD, BCPS, BCCCP 09/16/2024, 10:50 AM

## 2024-09-16 NOTE — Progress Notes (Signed)
 Psychiatric Nurse Liaison (PNL) Consult Note   Patient Mood/Affect: Agitated/Irritable/Labile  Noted Patient Behaviors: Consult requested do to patient becoming increasingly agitated and resistant to care. Upon arrival, patient was sitting in her chair. Patient uncooperative and verbally aggressive and threatening. Patient requested to be moved from the chair and into the bed.  Interventions Initiated by Psychiatric Nurse Liaison:  Nursing staff assisted patient into the bed. Primary RN administered IV ativan , per MAR. Therapeutic communication and emotional support.  Recommendations for Patient Care: Discussed with primary RN that patient might benefit from mood stabilization medications. Administer PRN medications as needed.   Patients Response to Treatment: Slight improvement in behaviors noted.  Time Spent with Patient:   20 mins

## 2024-09-16 NOTE — Progress Notes (Signed)
 Nutrition Follow-up  DOCUMENTATION CODES:   Obesity unspecified  INTERVENTION:  - TPN at goal.   - TPN management per pharmacy.   Updated kcal and protein goals post op    - Daily weights while on TPN.   Once able to use gastrostomy tube recommend:  Pivot 1.5 goal rate 55 ml/hr  Provides: 1980 kcal, 123 grams protein, and 1003 ml free water    NUTRITION DIAGNOSIS:   Inadequate oral intake related to altered GI function as evidenced by NPO status. Ongoing.   GOAL:   Patient will meet greater than or equal to 90% of their needs Progressing post-op on TPN   MONITOR:   PO intake, Supplement acceptance, Diet advancement  REASON FOR ASSESSMENT:   Consult Assessment of nutrition requirement/status  ASSESSMENT:   64 y.o. female with medical history significant of history of GI bleed (recent admission 1/3 for melena-EGD showed esophageal plaques suspicious for candidiasis nonbleeding gastric ulcer and gastritis-patient left AMA before completion of the treatment), essential hypertension, polysubstance abuse disorder, tobacco use disorder, thrombocytopenia, essential hypertension, physical deconditioning/debility and weakness, and morbid obesity presented complaining of black-colored stool that started 1/17/26and patient also has generalized weakness.  Pt discussed during ICU rounds and with RN and MD.  TPN needs updated; TPN to hang this pm will meet new estimated needs.  Pt is IVC'ed; working with RN/psych RN  In the past pt has drank [employer] but currently NPO.   1/18- s/p CT guided right perihepatic abscess drain placement  1/22- s/p UGI- no leak (suspicious for achalasia), advanced to clear liquids 1/23- advanced to full liquid diet 1/24- advanced to soft diet 1/25- CT revealed persistent large perihepatic abscess  1/28- CT reveals persistent large perihepatic collection, suspicion for gastric perforation contained within the abscess, 1/28- CT confirms suspicion  of gastric perforation contained within this abscess cavity 1/29 advanced to clear liquids 1/30 PICC placed; TPN initiated 1/31 TPN advanced to goal 2/5 - s/p ex lap, distal gastrectomy, cholecystectomy, gastro-jejunostomy creation, Stamm gastrostomy tube placement, abd washout, JP drain placement, incisional wound VAC  UOP 1585 ml  1 JP 135 ml  2 JP 125 ml  20 F gastrostomy 305 ml  VAC 0   Medications reviewed and include: - Protonix  Precedex   TPN @ 75 ml/hr provides: 1753 kcal and 104 grams protein   Labs reviewed:  Refeeding Labs: Recent Labs  Lab 09/14/24 0345 09/15/24 0308 09/16/24 0601  K 4.7 4.7 5.4*  MG 2.2 2.2 2.2  PHOS 3.3 3.8 3.0   Glucose Profile:  Recent Labs    09/14/24 1250 09/14/24 1644  GLUCAP 113* 108*    Diet Order:   Diet Order             Diet NPO time specified Except for: Sips with Meds, Ice Chips  Diet effective now                   EDUCATION NEEDS:  Not appropriate for education at this time  Skin:  Skin Assessment: Skin Integrity Issues: Skin Integrity Issues:: Incisions Incisions: abdomen  Last BM:  2/4  Height:  Ht Readings from Last 1 Encounters:  09/15/24 5' 4 (1.626 m)   Weight:  Wt Readings from Last 1 Encounters:  09/16/24 80.2 kg   Ideal Body Weight:  54.5 kg  BMI:  Body mass index is 30.35 kg/m.  Estimated Nutritional Needs:  Kcal:  1800-2000 Protein:  110-120 grams Fluid:  >1.8 L/day  Powell SQUIBB., RD, LDN, CNSC  See AMiON for contact information

## 2024-09-16 NOTE — Evaluation (Signed)
 Physical Therapy Evaluation Patient Details Name: Jennifer [name] MRN: [id number] DOB: 24-Oct-1960 Today's Date: 09/16/2024  History of Present Illness  64 y.o. female presents to [facility] hospital on 08/27/2024 with black tarry stools, vomiting and weakness. Pt recently admitted for GIB with suspicion for candidiasis and gastritis but left AMA prior to completion of treatment. Imaging notable for perforated gastric ulcer and R pleural effusion on 1/18, pt underwent IR drain placement along with thoracentesis on 1/18. Drain upsize on 1/26. Pt underwent ex-lap with distal gastrectomy, cholecystectomy, gastro jejunostomy creation, G-tube placement on 2/5. PMH includes HTN, bipolar affective disorder.  Clinical Impression  Pt presents to PT with deficits in functional mobility, gait, balance, cognition, endurance, strength, awareness. Pt is impulsive and requires frequent cues to stop mobilizing until a multitude of lines and drains can be properly managed. Pt requires UE support to maintain balance when upright. Pt will benefit from frequent mobilization in an effort to gradually improve activity tolerance and to restore independence. Patient will benefit from continued inpatient follow up therapy, <3 hours/day.      If plan is discharge home, recommend the following: A lot of help with walking and/or transfers;A lot of help with bathing/dressing/bathroom;Assistance with cooking/housework;Assist for transportation;Help with stairs or ramp for entrance;Direct supervision/assist for medications management;Direct supervision/assist for financial management;Supervision due to cognitive status   Can travel by private vehicle   Yes    Equipment Recommendations Rolling walker (2 wheels);BSC/3in1  Recommendations for Other Services       Functional Status Assessment Patient has had a recent decline in their functional status and demonstrates the ability to make significant improvements in function in a  reasonable and predictable amount of time.     Precautions / Restrictions Precautions Precautions: Fall Recall of Precautions/Restrictions: Impaired Precaution/Restrictions Comments: peg, JP drain, wound vac TPN Restrictions Weight Bearing Restrictions Per Provider Order: No      Mobility  Bed Mobility Overal bed mobility: Needs Assistance Bed Mobility: Supine to Sit     Supine to sit: Min assist, HOB elevated          Transfers Overall transfer level: Needs assistance Equipment used: 2 person hand held assist Transfers: Sit to/from Stand, Bed to chair/wheelchair/BSC Sit to Stand: Min assist   Step pivot transfers: Min assist, +2 physical assistance            Ambulation/Gait                  Stairs            Wheelchair Mobility     Tilt Bed    Modified Rankin (Stroke Patients Only)       Balance Overall balance assessment: Needs assistance Sitting-balance support: No upper extremity supported, Feet supported Sitting balance-Leahy Scale: Fair     Standing balance support: Bilateral upper extremity supported, Reliant on assistive device for balance Standing balance-Leahy Scale: Poor                               Pertinent Vitals/Pain Pain Assessment Pain Assessment: Faces Faces Pain Scale: Hurts even more Pain Location: back and abdomen Pain Descriptors / Indicators: Grimacing Pain Intervention(s): PCA encouraged    Home Living Family/patient expects to be discharged to:: Shelter/Homeless                   Additional Comments: recent PT evaluation in January indicates the pt is living in a shed  Prior Function Prior Level of Function : Independent/Modified Independent                     Extremity/Trunk Assessment   Upper Extremity Assessment Upper Extremity Assessment: Generalized weakness    Lower Extremity Assessment Lower Extremity Assessment: Generalized weakness    Cervical / Trunk  Assessment Cervical / Trunk Assessment: Other exceptions Cervical / Trunk Exceptions: gastrectomy, abdominal binder, wound vac and multiple drains  Communication   Communication Communication: Impaired (tangential thought) Factors Affecting Communication: Reduced clarity of speech    Cognition Arousal: Alert Behavior During Therapy: Impulsive, Agitated   PT - Cognitive impairments: Awareness, Attention, Problem solving, Safety/Judgement                         Following commands: Impaired Following commands impaired: Follows one step commands inconsistently, Follows multi-step commands inconsistently     Cueing Cueing Techniques: Verbal cues, Tactile cues, Visual cues     General Comments General comments (skin integrity, edema, etc.): pt on 8L [place] upon PT arrival, VSS with activity. Pt weaned to 5L [place] at rest at the end of session with stable SpO2    Exercises     Assessment/Plan    PT Assessment Patient needs continued PT services  PT Problem List Decreased strength;Decreased range of motion;Decreased activity tolerance;Decreased balance;Decreased mobility;Decreased knowledge of use of DME;Pain       PT Treatment Interventions DME instruction;Gait training;Stair training;Functional mobility training;Therapeutic exercise;Therapeutic activities;Balance training;Neuromuscular re-education;Patient/family education    PT Goals (Current goals can be found in the Care Plan section)  Acute Rehab PT Goals Patient Stated Goal: to return to independence PT Goal Formulation: With patient Time For Goal Achievement: 09/30/24 Potential to Achieve Goals: Fair    Frequency Min 3X/week     Co-evaluation PT/OT/SLP Co-Evaluation/Treatment: Yes Reason for Co-Treatment: Complexity of the patient's impairments (multi-system involvement);Necessary to address cognition/behavior during functional activity;To address functional/ADL transfers;For patient/therapist safety PT goals  addressed during session: Mobility/safety with mobility;Balance;Proper use of DME;Strengthening/ROM         AM-PAC PT 6 Clicks Mobility  Outcome Measure Help needed turning from your back to your side while in a flat bed without using bedrails?: A Little Help needed moving from lying on your back to sitting on the side of a flat bed without using bedrails?: A Little Help needed moving to and from a bed to a chair (including a wheelchair)?: A Lot Help needed standing up from a chair using your arms (e.g., wheelchair or bedside chair)?: A Lot Help needed to walk in hospital room?: Total Help needed climbing 3-5 steps with a railing? : Total 6 Click Score: 12    End of Session Equipment Utilized During Treatment: Oxygen Activity Tolerance: Patient tolerated treatment well Patient left: in chair;with call bell/phone within reach;with chair alarm set Nurse Communication: Mobility status PT Visit Diagnosis: Other abnormalities of gait and mobility (R26.89);Muscle weakness (generalized) (M62.81)    Time: 8775-8749 PT Time Calculation (min) (ACUTE ONLY): 26 min   Charges:   PT Evaluation $PT Eval Moderate Complexity: 1 Mod   PT General Charges $$ ACUTE PT VISIT: 1 Visit         Bernardino JINNY Ruth, PT, DPT Acute Rehabilitation Office [phone]   Bernardino JINNY Ruth 09/16/2024, 2:04 PM

## 2024-09-16 NOTE — Progress Notes (Signed)
 eLink Physician-Brief Progress Note Patient Name: Jennifer [name] DOB: 27-Dec-1960 MRN: [id number]   Date of Service  09/16/2024  HPI/Events of Note  RN called to notify us  of a 17 bt run of VTach. Patient had a K of 5.4 at 0600 this morning. They did adjust her TPN but patient refused the LOKELMA > She has psych issues and is paranoid so getting her to comply has been difficult. There was a BMP ordered for 1800 tonight that she also refused- but RN is going to call lab to have them try and collect now as she said she was okay with that.   Patient was calm/ sleeping when run of Vtach happened.   UPDATE: Patient now refusing lab draw again, is paranoid, refusing care and talking about calling the police. Asking for Zyprexa  possibly? She is IVC'd because she was ruled unable to make medical decisions.   eICU Interventions  - Patient does not have capacity.  However is refusing labs.  Has not taken her Lokelma  since the morning. - Will give Geodon  x 1. - Will get labs after Geodon . - If she does not take Lokelma  we will give insulin  and D50 after labs are drawn.      Intervention Category Major Interventions: Other: Evaluation Type: Other  Giovoni Bunch 09/16/2024, 9:37 PM

## 2024-09-16 NOTE — Progress Notes (Signed)
 Patient assisted to chair by PT without issue, but within 5 minutes of being in chair began requesting to get back in bed. After much conversation about the benefits of sitting up for a while and patient insisting on being physically incapable of it, RN attempted to assist patient back to bed. Patient then refused to allow RN to touch her or any of her tubes or lines. RN attempted to reorient and therapeutically redirect to allow staff to help, but patient continued to insist that RN can't touch her. Rapid response psych nurse contacted to assist with de-escalating as patient becoming more agitated. RN attempted to administer PRN ativan  to help but patient refusing to allow RN to touch her and stating you can't touch me without my consent. Patient also filming RN on cell phone. Rapid response psych nurse attempting de-escalation.   After several minutes of conversation and much reasoning, patient allowed RN and psych nurse to assist her safely back to bed. Patient allowed RN to administer ativan .

## 2024-09-16 NOTE — TOC Progression Note (Signed)
 Transition of Care [facility]) - Progression Note    Patient Details  Name: Jennifer [name] MRN: [id number] Date of Birth: 24-Feb-1961  [facility] CM/SW Contact  Inocente GORMAN Kindle, LCSW Phone Number: 09/16/2024, 9:29 AM  Clinical Narrative:    Patient transferred to 4N after surgery. Patient has a PEG, wound vac, TPN and methadone . IVC expires this afternoon; updated RN if that needs to be renewed. ICM continuing to follow for needs.    Expected Discharge Plan: Home/Self Care Barriers to Discharge: Continued Medical Work up               Expected Discharge Plan and Services       Living arrangements for the past 2 months: Single Family Home                                       Social Drivers of Health (SDOH) Interventions SDOH Screenings   Food Insecurity: No Food Insecurity (08/29/2024)  Housing: Low Risk (08/29/2024)  Transportation Needs: No Transportation Needs (08/29/2024)  Recent Concern: Transportation Needs - Unmet Transportation Needs (08/14/2024)  Utilities: Not At Risk (08/29/2024)  Depression (PHQ2-9): Low Risk (05/24/2024)  Recent Concern: Depression (PHQ2-9) - Medium Risk (03/04/2024)  Tobacco Use: High Risk (09/15/2024)    Readmission Risk Interventions     No data to display

## 2024-09-16 NOTE — Progress Notes (Signed)
 IR Brief Progress Note:  Patient went to OR yesterday with Dr. Paola for exploratory laparotomy, distal gastrectomy, cholecystectomy, gastro jejunostomy creation, Stamm gastrostomy tube placement, abdominal washout, JP drain placement, incisional wound VAC application for perforated distal gastric ulcer. Previously placed IR drain was removed. IR will sign-off. Please re-consult IR if new concerns arise.   Jennifer [name] B Vincent Streater NP 09/16/2024 3:20 PM
